# Patient Record
Sex: Male | Born: 1960
Health system: Southern US, Community
[De-identification: ages and names within clinical notes are randomized; demographics above are authoritative.]

## PROBLEM LIST (undated history)

## (undated) DIAGNOSIS — I471 Supraventricular tachycardia, unspecified: Secondary | ICD-10-CM

## (undated) DIAGNOSIS — N189 Chronic kidney disease, unspecified: Secondary | ICD-10-CM

## (undated) DIAGNOSIS — R011 Cardiac murmur, unspecified: Secondary | ICD-10-CM

## (undated) DIAGNOSIS — F419 Anxiety disorder, unspecified: Secondary | ICD-10-CM

## (undated) DIAGNOSIS — I341 Nonrheumatic mitral (valve) prolapse: Secondary | ICD-10-CM

## (undated) DIAGNOSIS — K5792 Diverticulitis of intestine, part unspecified, without perforation or abscess without bleeding: Secondary | ICD-10-CM

## (undated) DIAGNOSIS — G43909 Migraine, unspecified, not intractable, without status migrainosus: Secondary | ICD-10-CM

## (undated) DIAGNOSIS — K3184 Gastroparesis: Secondary | ICD-10-CM

## (undated) DIAGNOSIS — F329 Major depressive disorder, single episode, unspecified: Secondary | ICD-10-CM

## (undated) DIAGNOSIS — T7840XA Allergy, unspecified, initial encounter: Secondary | ICD-10-CM

## (undated) DIAGNOSIS — F32A Depression, unspecified: Secondary | ICD-10-CM

## (undated) HISTORY — DX: Cardiac murmur, unspecified: R01.1

## (undated) HISTORY — DX: Supraventricular tachycardia, unspecified: I47.10

## (undated) HISTORY — DX: Nonrheumatic mitral (valve) prolapse: I34.1

## (undated) HISTORY — DX: Anxiety disorder, unspecified: F41.9

## (undated) HISTORY — DX: Major depressive disorder, single episode, unspecified: F32.9

## (undated) HISTORY — DX: Gastroparesis: K31.84

## (undated) HISTORY — DX: Supraventricular tachycardia: I47.1

## (undated) HISTORY — DX: Migraine, unspecified, not intractable, without status migrainosus: G43.909

## (undated) HISTORY — DX: Depression, unspecified: F32.A

## (undated) HISTORY — DX: Allergy, unspecified, initial encounter: T78.40XA

---

## 1998-01-26 ENCOUNTER — Emergency Department (HOSPITAL_COMMUNITY): Admission: EM | Admit: 1998-01-26 | Discharge: 1998-01-26 | Payer: Self-pay | Admitting: Emergency Medicine

## 1998-01-26 ENCOUNTER — Encounter: Payer: Self-pay | Admitting: Emergency Medicine

## 1999-04-05 ENCOUNTER — Encounter: Payer: Self-pay | Admitting: Gastroenterology

## 1999-04-05 ENCOUNTER — Encounter: Admission: RE | Admit: 1999-04-05 | Discharge: 1999-04-05 | Payer: Self-pay | Admitting: Gastroenterology

## 1999-05-10 ENCOUNTER — Emergency Department (HOSPITAL_COMMUNITY): Admission: EM | Admit: 1999-05-10 | Discharge: 1999-05-10 | Payer: Self-pay | Admitting: Emergency Medicine

## 1999-05-10 ENCOUNTER — Encounter: Payer: Self-pay | Admitting: Emergency Medicine

## 2000-09-05 ENCOUNTER — Encounter: Admission: RE | Admit: 2000-09-05 | Discharge: 2000-09-05 | Payer: Self-pay | Admitting: Family Medicine

## 2000-09-23 ENCOUNTER — Encounter: Admission: RE | Admit: 2000-09-23 | Discharge: 2000-09-23 | Payer: Self-pay | Admitting: Family Medicine

## 2001-06-26 ENCOUNTER — Encounter: Admission: RE | Admit: 2001-06-26 | Discharge: 2001-06-26 | Payer: Self-pay | Admitting: Family Medicine

## 2002-06-11 ENCOUNTER — Encounter: Admission: RE | Admit: 2002-06-11 | Discharge: 2002-06-11 | Payer: Self-pay | Admitting: Emergency Medicine

## 2002-06-11 ENCOUNTER — Encounter: Payer: Self-pay | Admitting: Emergency Medicine

## 2002-09-25 ENCOUNTER — Encounter (INDEPENDENT_AMBULATORY_CARE_PROVIDER_SITE_OTHER): Payer: Self-pay | Admitting: Specialist

## 2002-09-25 ENCOUNTER — Ambulatory Visit (HOSPITAL_COMMUNITY): Admission: RE | Admit: 2002-09-25 | Discharge: 2002-09-25 | Payer: Self-pay | Admitting: Gastroenterology

## 2002-09-28 ENCOUNTER — Encounter: Admission: RE | Admit: 2002-09-28 | Discharge: 2002-09-28 | Payer: Self-pay | Admitting: Gastroenterology

## 2002-09-28 ENCOUNTER — Encounter: Payer: Self-pay | Admitting: Gastroenterology

## 2002-10-09 ENCOUNTER — Ambulatory Visit (HOSPITAL_COMMUNITY): Admission: RE | Admit: 2002-10-09 | Discharge: 2002-10-09 | Payer: Self-pay | Admitting: Surgery

## 2002-11-28 ENCOUNTER — Emergency Department (HOSPITAL_COMMUNITY): Admission: EM | Admit: 2002-11-28 | Discharge: 2002-11-28 | Payer: Self-pay | Admitting: Emergency Medicine

## 2002-11-28 ENCOUNTER — Encounter: Payer: Self-pay | Admitting: Emergency Medicine

## 2002-12-08 ENCOUNTER — Encounter: Admission: RE | Admit: 2002-12-08 | Discharge: 2002-12-08 | Payer: Self-pay | Admitting: Emergency Medicine

## 2002-12-08 ENCOUNTER — Encounter: Payer: Self-pay | Admitting: Emergency Medicine

## 2003-06-22 ENCOUNTER — Emergency Department (HOSPITAL_COMMUNITY): Admission: EM | Admit: 2003-06-22 | Discharge: 2003-06-22 | Payer: Self-pay | Admitting: Family Medicine

## 2005-01-19 ENCOUNTER — Encounter: Admission: RE | Admit: 2005-01-19 | Discharge: 2005-01-19 | Payer: Self-pay | Admitting: Emergency Medicine

## 2005-10-16 ENCOUNTER — Ambulatory Visit: Payer: Self-pay | Admitting: Gastroenterology

## 2005-10-17 ENCOUNTER — Ambulatory Visit: Payer: Self-pay | Admitting: Gastroenterology

## 2005-10-17 ENCOUNTER — Encounter (INDEPENDENT_AMBULATORY_CARE_PROVIDER_SITE_OTHER): Payer: Self-pay | Admitting: *Deleted

## 2005-10-22 ENCOUNTER — Ambulatory Visit (HOSPITAL_COMMUNITY): Admission: RE | Admit: 2005-10-22 | Discharge: 2005-10-22 | Payer: Self-pay | Admitting: Gastroenterology

## 2005-11-02 ENCOUNTER — Encounter: Admission: RE | Admit: 2005-11-02 | Discharge: 2005-11-02 | Payer: Self-pay | Admitting: Gastroenterology

## 2005-12-13 LAB — HM COLONOSCOPY

## 2008-01-15 ENCOUNTER — Encounter: Admission: RE | Admit: 2008-01-15 | Discharge: 2008-01-15 | Payer: Self-pay | Admitting: Emergency Medicine

## 2008-01-27 ENCOUNTER — Encounter: Admission: RE | Admit: 2008-01-27 | Discharge: 2008-04-26 | Payer: Self-pay | Admitting: Emergency Medicine

## 2008-03-10 ENCOUNTER — Encounter: Admission: RE | Admit: 2008-03-10 | Discharge: 2008-03-10 | Payer: Self-pay | Admitting: Emergency Medicine

## 2008-05-05 ENCOUNTER — Encounter: Admission: RE | Admit: 2008-05-05 | Discharge: 2008-05-21 | Payer: Self-pay | Admitting: Emergency Medicine

## 2008-11-30 ENCOUNTER — Encounter: Payer: Self-pay | Admitting: Cardiovascular Disease

## 2009-01-25 ENCOUNTER — Encounter: Admission: RE | Admit: 2009-01-25 | Discharge: 2009-01-25 | Payer: Self-pay | Admitting: Emergency Medicine

## 2009-03-29 ENCOUNTER — Encounter: Admission: RE | Admit: 2009-03-29 | Discharge: 2009-03-29 | Payer: Self-pay | Admitting: Emergency Medicine

## 2009-03-29 ENCOUNTER — Encounter: Payer: Self-pay | Admitting: Cardiovascular Disease

## 2009-04-17 ENCOUNTER — Emergency Department (HOSPITAL_COMMUNITY): Admission: EM | Admit: 2009-04-17 | Discharge: 2009-04-17 | Payer: Self-pay | Admitting: Family Medicine

## 2009-06-01 ENCOUNTER — Encounter: Admission: RE | Admit: 2009-06-01 | Discharge: 2009-06-01 | Payer: Self-pay | Admitting: Emergency Medicine

## 2009-06-13 ENCOUNTER — Emergency Department (HOSPITAL_BASED_OUTPATIENT_CLINIC_OR_DEPARTMENT_OTHER): Admission: EM | Admit: 2009-06-13 | Discharge: 2009-06-13 | Payer: Self-pay | Admitting: Emergency Medicine

## 2009-06-13 ENCOUNTER — Ambulatory Visit: Payer: Self-pay | Admitting: Diagnostic Radiology

## 2009-06-13 ENCOUNTER — Encounter: Payer: Self-pay | Admitting: Cardiovascular Disease

## 2009-06-14 ENCOUNTER — Encounter: Payer: Self-pay | Admitting: Cardiovascular Disease

## 2009-06-17 ENCOUNTER — Encounter: Payer: Self-pay | Admitting: Cardiovascular Disease

## 2009-06-20 ENCOUNTER — Encounter: Payer: Self-pay | Admitting: Cardiovascular Disease

## 2009-06-28 ENCOUNTER — Ambulatory Visit: Payer: Self-pay | Admitting: Cardiovascular Disease

## 2009-06-28 DIAGNOSIS — R002 Palpitations: Secondary | ICD-10-CM

## 2009-06-28 DIAGNOSIS — R079 Chest pain, unspecified: Secondary | ICD-10-CM

## 2009-07-12 ENCOUNTER — Telehealth (INDEPENDENT_AMBULATORY_CARE_PROVIDER_SITE_OTHER): Payer: Self-pay | Admitting: *Deleted

## 2009-07-13 ENCOUNTER — Encounter: Payer: Self-pay | Admitting: Cardiology

## 2009-07-13 ENCOUNTER — Ambulatory Visit: Payer: Self-pay | Admitting: Cardiology

## 2009-07-13 ENCOUNTER — Ambulatory Visit (HOSPITAL_COMMUNITY): Admission: RE | Admit: 2009-07-13 | Discharge: 2009-07-13 | Payer: Self-pay | Admitting: Cardiovascular Disease

## 2009-07-13 ENCOUNTER — Ambulatory Visit: Payer: Self-pay

## 2009-07-13 ENCOUNTER — Encounter (HOSPITAL_COMMUNITY): Admission: RE | Admit: 2009-07-13 | Discharge: 2009-08-31 | Payer: Self-pay | Admitting: Cardiovascular Disease

## 2009-07-15 DIAGNOSIS — G43909 Migraine, unspecified, not intractable, without status migrainosus: Secondary | ICD-10-CM | POA: Insufficient documentation

## 2009-07-15 DIAGNOSIS — I059 Rheumatic mitral valve disease, unspecified: Secondary | ICD-10-CM | POA: Insufficient documentation

## 2009-07-15 DIAGNOSIS — K589 Irritable bowel syndrome without diarrhea: Secondary | ICD-10-CM | POA: Insufficient documentation

## 2009-07-18 ENCOUNTER — Ambulatory Visit: Payer: Self-pay | Admitting: Cardiovascular Disease

## 2009-07-30 ENCOUNTER — Ambulatory Visit: Payer: Self-pay | Admitting: Radiology

## 2009-07-30 ENCOUNTER — Emergency Department (HOSPITAL_BASED_OUTPATIENT_CLINIC_OR_DEPARTMENT_OTHER): Admission: EM | Admit: 2009-07-30 | Discharge: 2009-07-30 | Payer: Self-pay | Admitting: Emergency Medicine

## 2009-08-12 ENCOUNTER — Encounter: Admission: RE | Admit: 2009-08-12 | Discharge: 2009-08-12 | Payer: Self-pay | Admitting: Emergency Medicine

## 2009-09-05 ENCOUNTER — Emergency Department (HOSPITAL_COMMUNITY): Admission: EM | Admit: 2009-09-05 | Discharge: 2009-09-05 | Payer: Self-pay | Admitting: Family Medicine

## 2009-12-15 ENCOUNTER — Telehealth: Payer: Self-pay | Admitting: Cardiovascular Disease

## 2009-12-20 ENCOUNTER — Telehealth: Payer: Self-pay | Admitting: Cardiovascular Disease

## 2010-01-04 ENCOUNTER — Ambulatory Visit: Payer: Self-pay | Admitting: Cardiovascular Disease

## 2010-01-05 ENCOUNTER — Ambulatory Visit: Payer: Self-pay | Admitting: Radiology

## 2010-01-05 ENCOUNTER — Emergency Department (HOSPITAL_BASED_OUTPATIENT_CLINIC_OR_DEPARTMENT_OTHER): Admission: EM | Admit: 2010-01-05 | Discharge: 2010-01-05 | Payer: Self-pay | Admitting: Emergency Medicine

## 2010-01-08 ENCOUNTER — Emergency Department (HOSPITAL_BASED_OUTPATIENT_CLINIC_OR_DEPARTMENT_OTHER): Admission: EM | Admit: 2010-01-08 | Discharge: 2010-01-08 | Payer: Self-pay | Admitting: Emergency Medicine

## 2010-01-08 ENCOUNTER — Ambulatory Visit: Payer: Self-pay | Admitting: Interventional Radiology

## 2010-01-09 ENCOUNTER — Telehealth: Payer: Self-pay | Admitting: Cardiovascular Disease

## 2010-01-10 ENCOUNTER — Ambulatory Visit: Payer: Self-pay | Admitting: Cardiovascular Disease

## 2010-01-10 ENCOUNTER — Telehealth: Payer: Self-pay | Admitting: Cardiovascular Disease

## 2010-01-19 ENCOUNTER — Ambulatory Visit (HOSPITAL_COMMUNITY): Admission: RE | Admit: 2010-01-19 | Discharge: 2010-01-19 | Payer: Self-pay | Admitting: Gastroenterology

## 2010-05-30 NOTE — Letter (Signed)
Summary: [facility] Annual Physical Note  [facility] Annual Physical Note   Imported By: Roderic Ovens 07/12/2009 14:51:37  _____________________________________________________________________  External Attachment:    Type:   Image     Comment:   External Document

## 2010-05-30 NOTE — Letter (Signed)
Summary: [facility] Practice Office Note  [facility] Office Note   Imported By: Roderic Ovens 07/12/2009 14:53:44  _____________________________________________________________________  External Attachment:    Type:   Image     Comment:   External Document

## 2010-05-30 NOTE — Progress Notes (Signed)
Summary: Pt having palp  Phone Note Call from Patient Call back at [facility] Phone [phone]   Caller: Patient Summary of Call: pt having palp  Initial call taken by: Judie Grieve,  December 15, 2009 9:23 AM  Follow-up for Phone Call        Called patient back. He is complaining of palpitations on and off times one week. He restarted the Toprol XL 25 mg every day but states that the medication is not working for him. He denies any  syncope or presyncope with the palpitations. His last event monitor  was around last March. Advised him to ge a BP/HR monitor and record vitals 2 times per day so we can see if medication can be increased. He will do this and call us back with the information. I advised him that  Dr.McAlhany might want to order another event monitor and if he does he will have to write a letter to [facility] to approve another monitor since it has been less tha 6 months since his last monitor. Will send this message to Dr.McAlhany for his recommendation. Follow-up by: J REISS RN     Appended Document: Pt having palp Can we call and check in on him today? If he is still having palpitations, he can increase his Toprol to 50 mg per day. He had no evidence of any arrythmias on his Holter this spring. Normal echo and stress test. Thanks, cdm  Appended Document: Pt having palp LMTCB.-nm

## 2010-05-30 NOTE — Progress Notes (Signed)
Summary: palp nad vomiting  Phone Note Call from Patient Call back at [phone]   Caller: Spouse/Allison Summary of Call: Pt wife calling regarding the pt having heart palp and the pt has been vomiting Initial call taken by: Judie Grieve,  January 09, 2010 8:40 AM  Follow-up for Phone Call        Spoke w/ pt's wife & she is concerned b/c pt has been vomiting and not able to take his cardizem so he is having more palpitations. Dr. Lorenz Coaster advised pt  to go to ED on Sunday & they hydrated him and noted a few PVCs on his EKG. Pt. released Sunday night from ED. Pt. has appt with Lorenz Coaster this am for his naseau and may need to be admitted for hydration. Told his wife that he is prob. having more palpitations b/c of his vomiting and not being able to take his cardizem so the plan would initially be to get his vomiting under control so he can take his meds. Told wife to call back if he has further problems or if primary does not control his naseau. Whitney Maeola Sarah RN  January 09, 2010 9:31 AM

## 2010-05-30 NOTE — Letter (Signed)
Summary: [facility] Practice Office Note  [facility] Office Note   Imported By: Roderic Ovens 07/12/2009 14:54:59  _____________________________________________________________________  External Attachment:    Type:   Image     Comment:   External Document

## 2010-05-30 NOTE — Assessment & Plan Note (Signed)
Summary: palpitations   Visit Type:  Follow-up Primary Provider:  Natasha Bence  CC:  palpitations.  History of Present Illness: [name] with history of migraine headaches and irritable bowel syndrome with previous diagnosis of mitral valve prolapse in 1989 who is here today for follow up. He was seen as a new patient three weeks ago for further evaluation of chest pain and "pounding" heart beats. He first noticed the heavy heart beats about 3 months prior to his first visit here in February 2011. These episodes were generally short lived without associated symptoms. On February 14th, he experienced a slow, pounding sensation in his chest with SOB and presented to [facility] on [address].  He had an EKG, CXR and blood work, all of which is reported as normal in the medical record. The episode had passed and all symptoms had resolved by the time he was evaluated in the ER. He has had daily symptoms since then. He actually had a Holter monitor on the day of his severe palpitations/pounding and this showed sinus rhythm. He has been under much stress lately. He has been seen several times by Dr. Lorenz Coaster who placed the Holter monitor and started him on Toprol Xl which has since been titrated to 50 mg per day. He was wearing a Holter monitor during his first visit in our office. That is reviewed and shows no atrial fibrillation, SVT or VT.  I ordered a stress test and echo. His stress test showed no ischemia. His echo was normal.   He tells me today that he is having daily palpitations. These feel like skipped beats folllowed by a pause. His EKG today shows a PVC.  He has been on Toprol in the past but this made him fatigued. He does not wish to restart his beta blocker.  He has plans for an EKG next week in workup on ongoing nausea and vomiting with weight loss.   Current Medications (verified): 1)  Klonopin 2 Mg Tabs (Clonazepam) .... Once Daily. As Needed 2)  Toprol Xl 25 Mg Xr24h-Tab  (Metoprolol Succinate) .... On Hold 3)  Compazine 10mg  .... As Needed For [place] 4)  Oxycodone-Acetaminophen 5-325 Mg Tabs (Oxycodone-Acetaminophen) .... Prn 5)  Oxigen .... As Needed  Allergies: 1)  ! Toradol 2)  ! Phenergan 3)  ! Nubain 4)  ! * Latex 5)  ! Erythromycin  Past History:  Past Medical History: Current Problems:  MIGRAINE HEADACHE (ICD-346.90) IRRITABLE BOWEL SYNDROME (ICD-564.1) Palpitations secondary to PVCs  Social History: Reviewed history from 07/15/2009 and no changes required. 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  The patient denies malaise, fever, weight gain/loss, vision loss, decreased hearing, hoarseness, chest pain, shortness of breath, prolonged cough, wheezing, sleep apnea, coughing up blood, abdominal pain, blood in stool, nausea, vomiting, diarrhea, heartburn, incontinence, blood in urine, muscle weakness, joint pain, leg swelling, rash, skin lesions, headache, fainting, dizziness, depression, anxiety, enlarged lymph nodes, easy bruising or bleeding, and environmental allergies.    Vital Signs:  Patient profile:   50 year old male Height:      71 inches Weight:      123.25 pounds BMI:     17.25 Pulse rate:   79 / minute Pulse rhythm:   irregular Resp:     18 per minute BP sitting:  106 / 72  (left arm) Cuff size:   regular  Vitals Entered By: Vikki Ports (January 04, 2010 3:02 PM)  Physical Exam  General:  General: Well developed, well nourished, NAD Musculoskeletal: Muscle strength 5/5 all ext Psychiatric: Mood and affect normal Neck: No JVD, no carotid bruits, no thyromegaly, no lymphadenopathy. Lungs:Clear bilaterally, no wheezes, rhonci, crackles CV: RRR no murmurs, gallops rubs Abdomen: soft, NT, ND, BS present Extremities: No edema, pulses 2+.    EKG  Procedure date:   01/04/2010  Findings:      Sinus rhythm, rate 81 bpm. PVC. RAE  Impression & Recommendations:  Problem # 1:  PALPITATIONS (ICD-785.1) His palpitations are most likely secondary to PVCs. With normal LV function, these are most likely benign. He feels that these are limiting his lifestyle. Will start Cardizem CD 120 mg once daily. He will check his BP this week at the local pharmacy. If his BP is low, he will call us and hold Cardizem.  He will not restart the Toprol as it made him feel weak.   His updated medication list for this problem includes:    Toprol Xl 25 Mg Xr24h-tab (Metoprolol succinate) ..... On hold    Cardizem Cd 120 Mg Xr24h-cap (Diltiazem hcl coated beads) .[name]... Take one capsule by mouth daily.  His updated medication list for this problem includes:    Toprol Xl 25 Mg Xr24h-tab (Metoprolol succinate) ..... On hold  Orders: EKG w/ Interpretation (93000)  Patient Instructions: 1)  Your physician recommends that you schedule a follow-up appointment in: 3 MONTHS 2)  Your physician has recommended you make the following change in your medication:  3)  START CARDIZEM CD 120MG  by mouth DAILY. Prescriptions: CARDIZEM CD 120 MG XR24H-CAP (DILTIAZEM HCL COATED BEADS) TAKE ONE CAPSULE by mouth DAILY.  #30 x 4   Entered by:   Whitney Maeola Sarah RN   Authorized by:   Verne Carrow, MD   Signed by:   Ellender Hose RN on 01/04/2010   Method used:   Electronically to        [facility] [phone]* (retail)       [address]       [place], [place]  [address]       Ph: [phone]       Fax: [phone]   RxID:   [id number]

## 2010-05-30 NOTE — Progress Notes (Signed)
Summary: Nuclear Pre-Procedure  Phone Note Outgoing Call   Call placed by: Milana Na, EMT-P,  July 12, 2009 1:26 PM Summary of Call: Reviewed information on Myoview Information Sheet (see scanned document for further details).  Spoke with patient.     Nuclear Med Background Indications for Stress Test: Evaluation for Ischemia   History: MVP   Symptoms: Chest Pain, Chest Pressure, Dizziness, Light-Headedness, Palpitations, SOB    Nuclear Pre-Procedure Height (in): 71  Nuclear Med Study Referring MD:  C.McAlhany

## 2010-05-30 NOTE — Letter (Signed)
Summary: [facility] Practice Office Note  [facility] Office Note   Imported By: Roderic Ovens 07/12/2009 14:54:15  _____________________________________________________________________  External Attachment:    Type:   Image     Comment:   External Document

## 2010-05-30 NOTE — Progress Notes (Signed)
Summary: Nuclear Pre-Procedure  Phone Note Outgoing Call   Call placed by: Milana Na, EMT-P,  July 12, 2009 1:18 PM Summary of Call: Reviewed information on Myoview Information Sheet (see scanned document for further details).  Spoke with patient.

## 2010-05-30 NOTE — Progress Notes (Signed)
Summary: still having palpitations  Phone Note Call from Patient   Reason for Call: Talk to Nurse Summary of Call: pt still having palpitations-made appt 9-7-but also returining call to [facility] from yesterday-pls call [phone] Initial call taken by: Glynda Jaeger,  December 20, 2009 9:10 AM  Follow-up for Phone Call        Pat, Can we have him increase his Toprol XL to 50 mg per day. I can see him as planned on 01/04/10. thanks, chris Follow-up by: Verne Carrow, MD,  December 20, 2009 9:13 AM  Additional Follow-up for Phone Call Additional follow up Details #1::        Spoke with pt who reports he is taking Toprol 25 mg daily. He has problems with migraines and is being weaned from his medications for migraines due to nausea.  He is going to see primary MD (Dr. Lorenz Coaster) tomorrow. I gave pt instructions per Dr.Aneira Cavitt to increase Toprol to 50 mg by mouth daily.  I told pt I would forward this phone note to Dr. Lorenz Coaster. Pt to see Dr. Clifton James on January 04, 2010. Additional Follow-up by: Dossie Arbour, RN, BSN,  December 20, 2009 9:57 AM

## 2010-05-30 NOTE — Progress Notes (Signed)
Summary: pt's wife wants a call  Phone Note Call from Patient   Caller: Spouse [phone] [facility] Reason for Call: Talk to Nurse Summary of Call: pt on nausea med-now having shakes-pt took benadryl and it stopped-however wife still concerned with pt's condition-pls call [phone] [facility] Initial call taken by: Glynda Jaeger,  January 10, 2010 9:05 AM  Follow-up for Phone Call        Pt still experiencing some palpitations. He is taking Reglan for his naseau prescribed by his primary MD and is able to keep his cardizem down. Advised pt to allow the cardizem to circulate in his system and call me back around noon if his palpitations are still bothering them and we may manage by adding a beta blocker or see him in the office. Whitney Maeola Sarah RN  January 10, 2010 9:23 AM   Additional Follow-up for Phone Call Additional follow up Details #1::        Pt. is coming to see Dr. Clifton James today 01/10/10 @ 3pm for his palpitations. Whitney Maeola Sarah RN  January 10, 2010 12:44 PM

## 2010-05-30 NOTE — Assessment & Plan Note (Signed)
Summary: rov/wpa   Visit Type:  DOD ADD-ON Primary Provider:  Natasha Bence  CC:  palpitations pt states these are sometimes painful.....  History of Present Illness: [name] with history of migraine headaches and irritable bowel syndrome with previous diagnosis of mitral valve prolapse in 1989 who is here today for follow up. He was here last week for follow up and had a PVC documented on his EKG. He did not tolerate beta blockers in the past. I started him on Cardizem CD 120 mg per day. He has had constant nausea and vomiting on a daily basis and has only been able to keep down two doses of Cardizem.  A GI workup has been started with EGD last week shwing some gastritis per pt. He has been started on a PPI with plans for a gastric emptying study. As noted before, his echo and stress test were normal. He describes feelings of fullness in his chest after the premature beats. No exertional chest pain or SOB. No near syncope or syncope.   He first noticed the heavy heart beats about 3 months prior to his first visit here in February 2011. These episodes were generally short lived without associated symptoms. On February 14th, he experienced a slow, pounding sensation in his chest with SOB and presented to [facility] on [address].  He had an EKG, CXR and blood work, all of which is reported as normal in the medical record. The episode had passed and all symptoms had resolved by the time he was evaluated in the ER. He has had daily symptoms since then. He actually had a Holter monitor on the day of his severe palpitations/pounding and this showed sinus rhythm.  He was wearing a Holter monitor during his first visit in our office. That was reviewed and showed no atrial fibrillation, SVT or VT.   [name]   Current Medications (verified): 1)  Klonopin 2 Mg Tabs (Clonazepam) .... Once Daily. As Needed 2)  Toprol Xl 25 Mg Xr24h-Tab (Metoprolol Succinate) .... On Hold 3)  Oxycodone-Acetaminophen  5-325 Mg Tabs (Oxycodone-Acetaminophen) .... Prn 4)  Oxigen .... As Needed 5)  Cardizem Cd 120 Mg Xr24h-Cap (Diltiazem Hcl Coated Beads) .... Take One Capsule By Mouth Daily. 6)  Reglan 10 Mg Tabs (Metoclopramide Hcl) .[name].. 1 Tab Once Daily 7)  Zofran 8 Mg Tabs (Ondansetron Hcl) .... As Needed 8)  Prilosec 40 Mg Cpdr (Omeprazole) .[name].. 1 Tab Once Daily  Allergies: 1)  ! Toradol 2)  ! Phenergan 3)  ! Nubain 4)  ! * Latex 5)  ! Erythromycin 6)  ! Compazine  Past History:  Past Medical History: Current Problems:  MIGRAINE HEADACHE (ICD-346.90) IRRITABLE BOWEL SYNDROME (ICD-564.1) Palpitations secondary to PVCs Unknown GI illness with constant nausea/vomiting and weight loss  Social History: Reviewed history from 07/15/2009 and no changes required. 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  The patient denies fatigue, malaise, fever, weight gain/loss, vision loss, decreased hearing, hoarseness, chest pain, shortness of breath, prolonged cough, wheezing, sleep apnea, coughing up blood, abdominal pain, blood in stool, diarrhea, heartburn, incontinence, blood in urine, muscle weakness, joint pain, leg swelling, rash, skin lesions, headache, fainting, dizziness, depression, anxiety, enlarged lymph nodes, easy bruising or bleeding, and environmental allergies.    Vital Signs:  Patient profile:   50  year old male Height:      71 inches Weight:      122.12 pounds BMI:     17.09 Pulse rate:   70 / minute Pulse rhythm:   irregular BP sitting:   96 / 62  (left arm) Cuff size:   regular  Vitals Entered By: Danielle Rankin, CMA (January 10, 2010 3:03 PM)  Physical Exam  General:  General: Well developed, well nourished, NAD Musculoskeletal: Muscle strength 5/5 all ext Psychiatric: Mood and affect normal Neck: No JVD, no carotid bruits,  no thyromegaly, no lymphadenopathy. Lungs:Clear bilaterally, no wheezes, rhonci, crackles CV: RRR no murmurs, gallops rubs Abdomen: soft, NT, ND, BS present Extremities: No edema, pulses 2+.    EKG  Procedure date:  01/10/2010  Findings:      Sinus rhythm, rate 70 bpm. PVCs.  Impression & Recommendations:  Problem # 1:  PALPITATIONS (ICD-785.1) He has PVCs on his EKG and on examination. He has normal LV function by echo. His stress test shows no ischemia. He currently is under much stress with his GI illness and constant nausea/vomiting and unexplained weight loss. His PVCs are symptomatic. He has been started on Cardizem CD 120 mg by mouth Qdaily. He has not been able to keep this down because of his nausea. I have reassured him that the PVCs are benign and while he notices them, they are  not dangerous. Once he is able to hold down the Cardizem, I think the PVCs will be better controlled. No further cardiac workup at this time.   His updated medication list for this problem includes:    Toprol Xl 25 Mg Xr24h-tab (Metoprolol succinate) ..... On hold    Cardizem Cd 120 Mg Xr24h-cap (Diltiazem hcl coated beads) .[name]... Take one capsule by mouth daily.  Orders: EKG w/ Interpretation (93000)  Patient Instructions: 1)  Your physician recommends that you schedule a follow-up appointment in: 6 months 2)  Your physician recommends that you continue on your current medications as directed. Please refer to the Current Medication list given to you today.

## 2010-05-30 NOTE — Letter (Signed)
Summary: Work Writer, Main Office  [address]   [place], [place] [address]   Phone: [phone]  Fax: [phone]     June 28, 2009    William [name]   The above named patient had a medical visit today at:  2:30     pm.  Please take this into consideration when reviewing the time away from work/school.      Sincerely yours,  Architectural technologist

## 2010-05-30 NOTE — Assessment & Plan Note (Signed)
Summary: Cardiology Nuclear Study  Nuclear Med Background Indications for Stress Test: Evaluation for Ischemia   History: Echo, MVP  History Comments:  ~10 yrs ago GXT:OK per patient; 07/13/09 Echo:EF=55-60%  Symptoms: Chest Pain, Chest Pressure, Dizziness, DOE, Fatigue, Fatigue with Exertion, Light-Headedness, Palpitations, Rapid HR, SOB  Symptoms Comments: Stress. Last episode of CP:07/08/09.   Nuclear Pre-Procedure Caffeine/Decaff Intake: None NPO After: 8:00 AM Lungs: Clear IV 0.9% NS with Angio Cath: 22g     IV Site: (R) Hand IV Started by: Irean Hong RN Chest Size (in) 38     Height (in): 71 Weight (lb): 122 BMI: 17.08 Tech Comments: Held Toprol x 24 hours  Nuclear Med Study 1 or 2 day study:  1 day     Stress Test Type:  Stress Reading MD:  Olga Millers, MD     Referring MD:  Verne Carrow, MD Resting Radionuclide:  Technetium 66m Tetrofosmin     Resting Radionuclide Dose:  11.0 mCi  Stress Radionuclide:  Technetium 23m Tetrofosmin     Stress Radionuclide Dose:  33.0 mCi   Stress Protocol Exercise Time (min):  7:15 min     Max HR:  153 bpm     Predicted Max HR:  171 bpm  Max Systolic BP: 151 mm Hg     Percent Max HR:  89.47 %     METS: 8.9 Rate Pressure Product:  [address]    Stress Test Technologist:  [employer] CMA-N     Nuclear Technologist:  Harlow Asa CNMT  Rest Procedure  Myocardial perfusion imaging was performed at rest 45 minutes following the intravenous administration of Myoview Technetium 46m Tetrofosmin.  Stress Procedure  The patient exercised for 7:15.  The patient stopped due to fatigue and he denied any significant chest pain.  He did c/o of a very brief pain at the beginning of exercise.  There were no significant ST-T wave changes.  Myoview was injected at peak exercise and myocardial perfusion imaging was performed after a brief delay.  QPS Raw Data Images:  Acuisition technically good; normal left ventricular size. Stress  Images:  There is normal uptake in all areas. Rest Images:  Normal homogeneous uptake in all areas of the myocardium. Subtraction (SDS):  No evidence of ischemia. Transient Ischemic Dilatation:  1.02  (Normal <1.22)  Lung/Heart Ratio:  .25  (Normal <0.45)  Quantitative Gated Spect Images QGS EDV:  63 ml QGS ESV:  26 ml QGS EF:  59 % QGS cine images:  Normal wall motion.   Overall Impression  Exercise Capacity: Fair exercise capacity. BP Response: Normal blood pressure response. Clinical Symptoms: Brief chest pressure. ECG Impression: No significant ST segment change suggestive of ischemia. Overall Impression: There is no sign of scar or ischemia.  Appended Document: Cardiology Nuclear Study Reviewed and ok. cdm  Appended Document: Cardiology Nuclear Study Dr. Clifton James reviewed with pt at office visit 07/18/09

## 2010-05-30 NOTE — Assessment & Plan Note (Signed)
Summary: per check out/sf   Visit Type:  1 mo f/u Primary Provider:  Natasha Bence  CC:  palpitations.  History of Present Illness: [name] with history of migraine headaches and irritable bowel syndrome with previous diagnosis of mitral valve prolapse in 1989 who is here today for follow up. He was seen as a new patient three weeks ago for further evaluation of chest pain and "pounding" heart beats. He first noticed the heavy heart beats about 3 months prior to his first visit here. These episodes were generally short lived without associated symptoms. On February 14th, he experienced a slow, pounding sensation in his chest with SOB and presented to [facility] on [address].  He had an EKG, CXR and blood work, all of which is reported as normal in the medical record. The episode had passed and all symptoms had resolved by the time he was evaluated in the ER. He has had daily symptoms since then. He actually had a Holter monitor on the day of his severe palpitations/pounding and this showed sinus rhythm. He has been under much stress lately. He has been seen several times by Dr. Lorenz Coaster who placed the Holter monitor and started him on Toprol Xl which has since been titrated to 50 mg per day. He was wearing a Holter monitor during his first visit in our office. That is reviewed and shows no atrial fibrillation, SVT or VT.    His stress test showed no ischemia. His echo was normal. He continues to have daily fatigue. No chest pain. Occasional "pounding" in his chest.   Current Medications (verified): 1)  Klonopin 2 Mg Tabs (Clonazepam) .... Once Daily 2)  Remeron 45 Mg Tabs (Mirtazapine) .... Eveery Night 3)  Toprol Xl 25 Mg Xr24h-Tab (Metoprolol Succinate) .... Once Daily As Needed 4)  Amitiza 24 Mcg Caps (Lubiprostone) .... Prn 5)  Generlac 10 Gm/75ml Soln (Lactulose Encephalopathy) .... Prn 6)  Zofran 8 Mg Tabs (Ondansetron Hcl) .... As Needed For [place] 7)  Hyomax-Sl 0.125 Mg  Subl (Hyoscyamine Sulfate) .... Prn 8)  Oxycodone-Acetaminophen 5-325 Mg Tabs (Oxycodone-Acetaminophen) .... Prn  Allergies: 1)  ! Toradol 2)  ! Phenergan 3)  ! Nubain 4)  ! * Latex 5)  ! Erythromycin  Past History:  Past Medical History: Current Problems:  MIGRAINE HEADACHE (ICD-346.90) IRRITABLE BOWEL SYNDROME (ICD-564.1)  Social History: Reviewed history from 07/15/2009 and no changes required. 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  The patient denies malaise, fever, weight gain/loss, vision loss, decreased hearing, hoarseness, chest pain, shortness of breath, prolonged cough, wheezing, sleep apnea, coughing up blood, abdominal pain, blood in stool, nausea, vomiting, diarrhea, heartburn, incontinence, blood in urine, muscle weakness, joint pain, leg swelling, rash, skin lesions, headache, fainting, dizziness, depression, anxiety, enlarged lymph nodes, easy bruising or bleeding, and environmental allergies.    Vital Signs:  Patient profile:   50 year old male Height:      71 inches Weight:      125 pounds BMI:     17.50 Pulse rate:   88 / minute Pulse rhythm:   regular BP sitting:   94 / 60  (left arm) Cuff size:   regular  Vitals Entered By: Danielle Rankin, CMA (July 18, 2009 10:17 AM)  Physical Exam  General:  General: Well developed, well  nourished, NAD Psychiatric: Mood and affect normal Neck: No JVD, no carotid bruits, no thyromegaly, no lymphadenopathy. Lungs:Clear bilaterally, no wheezes, rhonci, crackles CV: RRR no murmurs, gallops rubs Abdomen: soft, NT, ND, BS present Extremities: No edema, pulses 2+.    Nuclear ETT  Procedure date:  07/13/2009  Findings:      Stress Procedure   The patient exercised for 7:15.  The patient stopped due to fatigue and he denied any significant chest pain.  He did c/o of a  very brief pain at the beginning of exercise.  There were no significant ST-T wave changes.  Myoview was injected at peak exercise and myocardial perfusion imaging was performed after a brief delay.  QPS  Raw Data Images:  Acuisition technically good; normal left ventricular size. Stress Images:  There is normal uptake in all areas. Rest Images:  Normal homogeneous uptake in all areas of the myocardium. Subtraction (SDS):  No evidence of ischemia. Transient Ischemic Dilatation:  1.02  (Normal <1.22)  Lung/Heart Ratio:  .25  (Normal <0.45)  Quantitative Gated Spect Images  QGS EDV:  63 ml QGS ESV:  26 ml QGS EF:  59 % QGS cine images:  Normal wall motion.   Overall Impression   Exercise Capacity: Fair exercise capacity. BP Response: Normal blood pressure response. Clinical Symptoms: Brief chest pressure. ECG Impression: No significant ST segment change suggestive of ischemia. Overall Impression: There is no sign of scar or ischemia.    Echocardiogram  Procedure date:  07/13/2009  Findings:      Left ventricle: The cavity size was normal. Wall thickness was     normal. Systolic function was normal. The estimated ejection     fraction was in the range of 55% to 60%. Wall motion was normal;     there were no regional wall motion abnormalities.    Impression & Recommendations:  Problem # 1:  PALPITATIONS (ICD-785.1) No evidence of arrhythmias on Holter Monitor or 21 day event monitor. He tells me that he was feeling the palpitations while wearing the monitor. No further workup.  His updated medication list for this problem includes:    Toprol Xl 25 Mg Xr24h-tab (Metoprolol succinate) ..... Once daily as needed  Problem # 2:  CHEST PAIN (ICD-786.50) No ischemia on stress test. Normal LV systolic function. No significant valvular disease. No further workup.   His updated medication list for this problem includes:    Toprol Xl 25 Mg Xr24h-tab (Metoprolol succinate) ..... Once  daily as needed  Problem # 3:  MITRAL VALVE PROLAPSE (ICD-424.0) Trivial MR on echo. No evidence of significant prolapse.   His updated medication list for this problem includes:    Toprol Xl 25 Mg Xr24h-tab (Metoprolol succinate) ..... Once daily as needed  Patient Instructions: 1)  Your physician recommends that you schedule a follow-up appointment as needed  2)  Your physician recommends that you continue on your current medications as directed. Please refer to the Current Medication list given to you today.

## 2010-05-30 NOTE — Assessment & Plan Note (Signed)
Summary: np6/palps/cp/jss   Visit Type:  Initial Consult Primary Provider:  Natasha Bence  CC:  palpitations.  History of Present Illness: [name] with history of migraine headaches and irritable bowel syndrome with previous diagnosis of mitral valve prolapse in 1989 who is referred today for further evaluation of chest pain and "pounding" heart beats. He first noticed the heavy heart beats about 3 months ago. These episodes were generally short lived without associated symptoms. On February 14th, he experienced a slow, pounding sensation in his chest with SOB and presented to [facility] on [address].  He had an EKG, CXR and blood work, all of which is reported as normal in the medical record. The episode had passed and all symptoms had resolved by the time he was evaluated in the ER. He has had daily symptoms since then. He actually had a Holter monitor on the day of his severe palpitations/pounding and this showed sinus rhythm. He has been under much stress lately. He has been seen several times by Dr. Lorenz Coaster who placed the Holter monitor and started him on Toprol Xl which has since been titrated to 50 mg per day. He currently is wearing a 21 day event monitor. As noted above, he continues to have daily episodes of mild pounding in his chest. There have been some episodes of squeezing type pain in the center of his chest. This seems to be associated with the pounding sensation. He has not passed out. He does occasionally feel dizzy and lightheaded when he notices the pounding sensation in his chest.   His records from Dr. Ginny Forth office include the Holter report and strips. I have personally reviewed this and confirmed sinus rhythm without PACs, PVCs, atrial fibrillation or SVT.   Problems Prior to Update: 1)  Palpitations  (ICD-785.1) 2)  Chest Pain  (ICD-786.50)  Current Medications (verified): 1)  Klonopin 2 Mg Tabs (Clonazepam) .... Once Daily 2)  Remeron 45 Mg Tabs  (Mirtazapine) .... Eveery Night 3)  Toprol Xl 25 Mg Xr24h-Tab (Metoprolol Succinate) .... Once Daily 4)  Lotemax 0.5 % Susp (Loteprednol Etabonate) .... Irration of The Eye 5)  Restasis 0.05 % Emul (Cyclosporine) .... For Dry Eye 6)  Amitiza 24 Mcg Caps (Lubiprostone) .... Prn 7)  Generlac 10 Gm/8ml Soln (Lactulose Encephalopathy) .... Prn 8)  Meloxicam 15 Mg Tabs (Meloxicam) .... Prn 9)  Zofran 8 Mg Tabs (Ondansetron Hcl) .... Prn 10)  Hyomax-Sl 0.125 Mg Subl (Hyoscyamine Sulfate) .... Prn 11)  Oxycodone-Acetaminophen 5-325 Mg Tabs (Oxycodone-Acetaminophen) .... Prn  Past History:  Past Medical History: Migraine Headaches IBS Palpitations Mitral Valve Prolapse  Past Surgical History: None  Family History: Reviewed history from 06/27/2006 and no changes required. Father died of lung CA Mother alive with HTN One brother is younger and healthy  No CAD  Social History: Reviewed history from 06/28/2009 and no changes required. 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  The patient denies fatigue, malaise, fever, weight gain/loss, vision loss, decreased hearing, hoarseness, prolonged cough, wheezing, sleep apnea, coughing up blood, abdominal pain, blood in stool, nausea, vomiting, diarrhea, heartburn, incontinence, blood in urine, muscle weakness, joint pain, leg swelling, rash, skin lesions, headache, fainting, dizziness, depression, enlarged lymph nodes, easy bruising or bleeding, and environmental allergies.    Vital Signs:  Patient  profile:   50 year old male Height:      71 inches Weight:      123 pounds BMI:     17.22 Pulse rate:   76 / minute BP sitting:   106 / 71  (left arm) Cuff size:   regular  Vitals Entered By: [name] (June 28, 2009 3:12 PM)  Physical Exam  General:   General: Well developed, well nourished, NAD HEENT: OP clear, mucus membranes moist SKIN: warm, dry Neuro: No focal deficits Musculoskeletal: Muscle strength 5/5 all ext Psychiatric: Mood and affect normal Neck: No JVD, no carotid bruits, no thyromegaly, no lymphadenopathy. Lungs:Clear bilaterally, no wheezes, rhonci, crackles CV: RRR no murmurs, gallops rubs Abdomen: soft, NT, ND, BS present Extremities: No edema, pulses 2+.    EKG  Procedure date:  06/28/2009  Findings:      NSR, rate 74 bpm. Normal EKG.  Holter Monitor  Procedure date:  06/13/2009  Findings:      Normal sinus rhythm. No PACs, PVCs. No atrial fibrillation or SVT.   Impression & Recommendations:  Problem # 1:  CHEST PAIN (ICD-786.50) His symptoms are not classic for angina. He has no cardiac risk factors. He is having repeated episodes of chest discomfort. I suspect that this is related to anxiety. I will arrange an exercise stress myoview to exclude CAD. I will also arrange an echo to assess for structural heart disease. Continue Toprol Xl once daily.   His updated medication list for this problem includes:    Toprol Xl 25 Mg Xr24h-tab (Metoprolol succinate) ..... Once daily  Orders: Echocardiogram (Echo) Nuclear Stress Test (Nuc Stress Test)  Problem # 2:  PALPITATIONS (ICD-785.1) No evidence of arrhythmia on Holter monitor. He is currently wearing a 21 day event monitor. I will ask Dr. Lorenz Coaster to send me a copy of the event monitor when it is complete.   His updated medication list for this problem includes:    Toprol Xl 25 Mg Xr24h-tab (Metoprolol succinate) ..... Once daily  Other Orders: EKG w/ Interpretation (93000)  Patient Instructions: 1)  Your physician recommends that you schedule a follow-up appointment in: 3-4 weeks 2)  Your physician has requested that you have an echocardiogram.  Echocardiography is a painless test that uses sound waves to create images of your heart. It provides  your doctor with information about the size and shape of your heart and how well your heart's chambers and valves are working.  This procedure takes approximately one hour. There are no restrictions for this procedure. 3)  Your physician has requested that you have an exercise stress myoview.  For further information please visit [email or web address]  Please follow instruction sheet, as given.  Appended Document: np6/palps/cp/jss Received event monitor from Dr. Ginny Forth office. Sinus rhythm. NO SVT or VT.

## 2010-05-30 NOTE — Letter (Signed)
Summary: [facility] Practice Office Note  [facility] Office Note   Imported By: Roderic Ovens 07/12/2009 14:52:04  _____________________________________________________________________  External Attachment:    Type:   Image     Comment:   External Document

## 2010-06-22 ENCOUNTER — Telehealth (INDEPENDENT_AMBULATORY_CARE_PROVIDER_SITE_OTHER): Payer: Self-pay | Admitting: *Deleted

## 2010-06-27 NOTE — Progress Notes (Signed)
  DDS Request received sent to [facility]  June 22, 2010 1:09 PM

## 2010-07-13 LAB — CBC
Hemoglobin: 15.6 g/dL (ref 13.0–17.0)
MCH: 32.7 pg (ref 26.0–34.0)
MCH: 33 pg (ref 26.0–34.0)
MCHC: 32.9 g/dL (ref 30.0–36.0)
MCV: 99.2 fL (ref 78.0–100.0)
Platelets: 207 10*3/uL (ref 150–400)
RBC: 4.41 MIL/uL (ref 4.22–5.81)
RBC: 4.76 MIL/uL (ref 4.22–5.81)
RDW: 12.5 % (ref 11.5–15.5)
WBC: 9.8 10*3/uL (ref 4.0–10.5)

## 2010-07-13 LAB — URINALYSIS, ROUTINE W REFLEX MICROSCOPIC
Bilirubin Urine: NEGATIVE
Hgb urine dipstick: NEGATIVE
Nitrite: NEGATIVE
Specific Gravity, Urine: 1.011 (ref 1.005–1.030)
Urobilinogen, UA: 0.2 mg/dL (ref 0.0–1.0)
pH: 5.5 (ref 5.0–8.0)

## 2010-07-13 LAB — LIPASE, BLOOD: Lipase: 34 U/L (ref 23–300)

## 2010-07-13 LAB — COMPREHENSIVE METABOLIC PANEL
ALT: 16 U/L (ref 0–53)
Albumin: 4.3 g/dL (ref 3.5–5.2)
Alkaline Phosphatase: 65 U/L (ref 39–117)
BUN: 5 mg/dL — ABNORMAL LOW (ref 6–23)
CO2: 26 mEq/L (ref 19–32)
Calcium: 9.7 mg/dL (ref 8.4–10.5)
Chloride: 103 mEq/L (ref 96–112)
Chloride: 104 mEq/L (ref 96–112)
Creatinine, Ser: 1 mg/dL (ref 0.4–1.5)
GFR calc non Af Amer: >60 mL/min (ref >60)
GFR calc non Af Amer: >60 mL/min (ref >60)
Glucose, Bld: 82 mg/dL (ref 70–99)
Glucose, Bld: 98 mg/dL (ref 70–99)
Potassium: 3.8 mEq/L (ref 3.5–5.1)
Sodium: 143 mEq/L (ref 135–145)
Total Bilirubin: 1.5 mg/dL — ABNORMAL HIGH (ref 0.3–1.2)
Total Bilirubin: 1.6 mg/dL — ABNORMAL HIGH (ref 0.3–1.2)

## 2010-07-13 LAB — DIFFERENTIAL
Basophils Absolute: 0.4 10*3/uL — ABNORMAL HIGH (ref 0.0–0.1)
Basophils Relative: 4 % — ABNORMAL HIGH (ref 0–1)
Eosinophils Absolute: 0 10*3/uL (ref 0.0–0.7)
Eosinophils Relative: 0 % (ref 0–5)
Lymphocytes Relative: 15 % (ref 12–46)
Lymphocytes Relative: 29 % (ref 12–46)
Lymphs Abs: 2.5 10*3/uL (ref 0.7–4.0)
Monocytes Absolute: 0.8 10*3/uL (ref 0.1–1.0)
Neutro Abs: 7.4 10*3/uL (ref 1.7–7.7)
Neutrophils Relative %: 75 % (ref 43–77)

## 2010-07-13 LAB — POCT CARDIAC MARKERS
CKMB, poc: <1 ng/mL — ABNORMAL LOW (ref 1.0–8.0)
CKMB, poc: <1 ng/mL — ABNORMAL LOW (ref 1.0–8.0)
CKMB, poc: <1 ng/mL — ABNORMAL LOW (ref 1.0–8.0)
Myoglobin, poc: 40.8 ng/mL (ref 12–200)
Troponin i, poc: <0.05 ng/mL (ref 0.00–0.09)
Troponin i, poc: <0.05 ng/mL (ref 0.00–0.09)

## 2010-07-20 LAB — BASIC METABOLIC PANEL
CO2: 28 mEq/L (ref 19–32)
Calcium: 8.7 mg/dL (ref 8.4–10.5)
GFR calc Af Amer: >60 mL/min (ref >60)
GFR calc non Af Amer: >60 mL/min (ref >60)
Glucose, Bld: 78 mg/dL (ref 70–99)
Potassium: 3.9 mEq/L (ref 3.5–5.1)
Sodium: 138 mEq/L (ref 135–145)

## 2010-07-20 LAB — DIFFERENTIAL
Eosinophils Relative: 2 % (ref 0–5)
Lymphocytes Relative: 33 % (ref 12–46)
Lymphs Abs: 2.1 10*3/uL (ref 0.7–4.0)
Monocytes Absolute: 0.4 10*3/uL (ref 0.1–1.0)
Monocytes Relative: 6 % (ref 3–12)
Neutro Abs: 3.6 10*3/uL (ref 1.7–7.7)

## 2010-07-20 LAB — CBC
HCT: 43.4 % (ref 39.0–52.0)
Hemoglobin: 14.6 g/dL (ref 13.0–17.0)
RBC: 4.4 MIL/uL (ref 4.22–5.81)
RDW: 12.2 % (ref 11.5–15.5)

## 2010-07-20 LAB — POCT CARDIAC MARKERS
CKMB, poc: <1 ng/mL — ABNORMAL LOW (ref 1.0–8.0)
Troponin i, poc: <0.05 ng/mL (ref 0.00–0.09)

## 2010-07-31 LAB — POCT URINALYSIS DIP (DEVICE)
Bilirubin Urine: NEGATIVE
Glucose, UA: NEGATIVE mg/dL
Ketones, ur: NEGATIVE mg/dL
Nitrite: NEGATIVE
pH: 7 (ref 5.0–8.0)

## 2010-07-31 LAB — URINE CULTURE
Colony Count: NO GROWTH
Culture: NO GROWTH

## 2010-07-31 LAB — HEMOCCULT GUIAC POC 1CARD (OFFICE): Fecal Occult Bld: NEGATIVE

## 2010-09-15 NOTE — Op Note (Signed)
NAME:  William [name], William [name]                         ACCOUNT NO.:  [id number]   MEDICAL RECORD NO.:  [id number]                   PATIENT TYPE:  AMB   LOCATION:  ENDO                                 FACILITY:   PHYSICIAN:  Anselmo Rod, M.D.               DATE OF BIRTH:  1960/06/27   DATE OF PROCEDURE:  09/25/2002  DATE OF DISCHARGE:                                 OPERATIVE REPORT   PROCEDURE:  Colonoscopy with biopsy.   ENDOSCOPIST:  Anselmo Rod, M.D.   INSTRUMENT USED:  Olympus video colonoscope.   INDICATIONS FOR PROCEDURE:  A 51 year old African-American male with a  history of abnormal weight loss and diarrhea.  Random biopsies planned to  rule out collagenous versus microscopic colitis, rule out colon polyps,  masses, etc.   PREPROCEDURE PREPARATION:  Informed consent was secured from the patient.  The patient had fasted for eight hours prior to the procedure.   PREPROCEDURE PHYSICAL:  The patient has stable vital signs.  Neck supple.  Chest clear to auscultation.  Heart S1, S2, regular.  Abdomen soft with  normal bowel sounds.   DESCRIPTION OF PROCEDURE:  The patient was placed in the left lateral  decubitus position and sedated with 50 mg of Demerol and 5 mg of Versed  intravenously.  Once the patient was adequately sedated he was maintained on  low-flow oxygen and continuous cardiac monitoring.  The Olympus video  colonoscope was advanced from the rectum to the cecum with slight  difficulty.  The patient had some stool in the colon.  Multiple washes were  done.  A few early diverticula were seen in the left colon.  No masses,  polyps or erosions were appreciated.  Random biopsies were done from the  colon to rule out collagenous versus microscopic colitis.  The appendiceal  orifice and ileocecal valve were clearly visualized and photographed.  The  terminal ileum was not visualized in spite of several attempts made to do  so.  The patient tolerated the procedure  well without complications.  Retroflexion in the rectum revealed no acute abnormalities.   IMPRESSION:  1. Few early left-sided diverticula in the colon.  2. Random biopsies done for microscopic versus collagenous colitis.  3. No masses or polyps seen.  4. Normal colon.    RECOMMENDATIONS:  1. Await pathology results.  2. Outpatient followup in the next two weeks with further recommendation.                                               Anselmo Rod, M.D.    JNM/MEDQ  D:  09/25/2002  T:  09/26/2002  Job:  [id number]   cc:   Reuben Likes, M.D.  [address]  [place]  [place] [address]  Fax:  [phone]  

## 2011-05-24 ENCOUNTER — Ambulatory Visit: Payer: Self-pay | Admitting: Family Medicine

## 2011-05-24 DIAGNOSIS — G43009 Migraine without aura, not intractable, without status migrainosus: Secondary | ICD-10-CM

## 2011-05-25 ENCOUNTER — Encounter: Payer: Self-pay | Admitting: Family Medicine

## 2011-07-11 ENCOUNTER — Ambulatory Visit: Payer: Self-pay | Admitting: Emergency Medicine

## 2011-07-11 VITALS — BP 115/79 | HR 103 | Temp 98.0°F | Resp 16 | Ht 70.0 in | Wt 145.0 lb

## 2011-07-11 DIAGNOSIS — IMO0001 Reserved for inherently not codable concepts without codable children: Secondary | ICD-10-CM

## 2011-07-11 DIAGNOSIS — R509 Fever, unspecified: Secondary | ICD-10-CM

## 2011-07-11 DIAGNOSIS — J329 Chronic sinusitis, unspecified: Secondary | ICD-10-CM

## 2011-07-11 LAB — POCT CBC
HCT, POC: 47.4 % (ref 43.5–53.7)
Lymph, poc: 2 (ref 0.6–3.4)
MCHC: 31.2 g/dL — AB (ref 31.8–35.4)
MID (cbc): 0.8 (ref 0–0.9)
POC Granulocyte: 9.2 — AB (ref 2–6.9)
POC LYMPH PERCENT: 17 %L (ref 10–50)
Platelet Count, POC: 234 10*3/uL (ref 142–424)
RDW, POC: 13 %

## 2011-07-11 LAB — POCT INFLUENZA A/B: Influenza B, POC: NEGATIVE

## 2011-07-11 MED ORDER — CEFDINIR 300 MG PO CAPS: 300.0000 mg | ORAL_CAPSULE | Freq: Two times a day (BID) | ORAL | Status: DC

## 2011-07-11 MED ORDER — AMOXICILLIN 500 MG PO CAPS: 500.0000 mg | ORAL_CAPSULE | Freq: Three times a day (TID) | ORAL | Status: DC

## 2011-07-11 NOTE — Progress Notes (Signed)
  Subjective:    Patient ID: William [name], male    DOB: 08/11/1960, 51 y.o.   MRN: [id number]  HPI patient enters with onset Monday of head congestion sore throat sinus drainage. He said chills and severe myalgias. Her flu shot this year. He does not do well with cough medications because they cause urinary retention.    Review of Systems he has significant GI disorder and has been on chronic Reglan therapy for gastroparesis. He is under the care of a gastroenterologist for this problem.     Objective:   Physical Exam  Constitutional: He appears well-developed and well-nourished.  HENT:  Right Ear: External ear normal.  Left Ear: External ear normal.  Eyes: Pupils are equal, round, and reactive to light.  Neck: No JVD present. No tracheal deviation present. No thyromegaly present.  Cardiovascular: Normal rate.  Exam reveals no gallop and no friction rub.   No murmur heard. Pulmonary/Chest: No respiratory distress. He has no wheezes. He has no rales. He exhibits no tenderness.  Abdominal: He exhibits no distension and no mass. There is no tenderness. There is no rebound and no guarding.  Lymphadenopathy:    He has no cervical adenopathy.          Assessment & Plan:   Patient presents with flulike symptoms headache myalgia sore throat cough. Check a strep test and flu swab. The test was negative strep test was negative white count was elevated consistent with a acute sinusitis.

## 2011-07-15 ENCOUNTER — Telehealth: Payer: Self-pay

## 2011-07-15 NOTE — Telephone Encounter (Signed)
Spoke with Dr. Cleta Alberts, he states that he advised patient he could do a payment plan, but this is based on [employer] payment plan policy.  Pt was notified and will CB Monday to discuss this with billing office.  Also, patient would like rx for cough.  He states that Robitussin with CDN has worked well in past.  Can we rx to [facility]?  Pls advise.

## 2011-07-15 NOTE — Telephone Encounter (Signed)
.  [facility] Patient called and stated that Dr. Cleta Alberts had told patient that he could be on a payment plan since he is uninsured.  Patient wanted to clarify that this was the case if he came in to be seen.  Patient stated he was told he could pay a third of the cost of the visit.  Please clarify this with Dr. Cleta Alberts and notify patient.  Patient's phone number is [phone].

## 2011-07-16 NOTE — Telephone Encounter (Signed)
Patient notified that medication can not be called in, would have to be seen for cough.  He was here on 07/11/11 and his cough is not getting better. He is asking if he can get anything called in for cough with out having to come in?

## 2011-07-16 NOTE — Telephone Encounter (Signed)
Needs OV for cough syrup with codeine, contact the billing office to set up payment plan if desires OV.

## 2011-07-17 ENCOUNTER — Telehealth: Payer: Self-pay

## 2011-07-17 NOTE — Telephone Encounter (Signed)
Spoke to pt , still coughing yellow sputum, however it it becoming lighter in appearance.  Would  like something for his cough.   Pt still taking amoxicillin. . Advised to finish all antibiotic, however if cough persists or worsens after meds are completed, he should call back and make [place] aware.lighter

## 2011-07-17 NOTE — Telephone Encounter (Signed)
Pt would like a call regarding cough medicine.

## 2011-10-08 ENCOUNTER — Ambulatory Visit: Payer: Self-pay | Admitting: Emergency Medicine

## 2011-10-08 VITALS — BP 105/71 | HR 91 | Temp 97.5°F | Resp 16 | Ht 71.0 in | Wt 150.0 lb

## 2011-10-08 DIAGNOSIS — F329 Major depressive disorder, single episode, unspecified: Secondary | ICD-10-CM

## 2011-10-08 DIAGNOSIS — R109 Unspecified abdominal pain: Secondary | ICD-10-CM

## 2011-10-08 DIAGNOSIS — E785 Hyperlipidemia, unspecified: Secondary | ICD-10-CM

## 2011-10-08 DIAGNOSIS — K3184 Gastroparesis: Secondary | ICD-10-CM

## 2011-10-08 LAB — POCT CBC
HCT, POC: 45.2 % (ref 43.5–53.7)
Hemoglobin: 14.4 g/dL (ref 14.1–18.1)
MCH, POC: 30.3 pg (ref 27–31.2)
MPV: 9.5 fL (ref 0–99.8)
POC Granulocyte: 13.1 — AB (ref 2–6.9)
POC MID %: 5.4 %M (ref 0–12)
RBC: 4.76 M/uL (ref 4.69–6.13)
WBC: 16.2 10*3/uL — AB (ref 4.6–10.2)

## 2011-10-08 LAB — LIPID PANEL
Cholesterol: 221 mg/dL — ABNORMAL HIGH (ref 0–200)
HDL: 48 mg/dL (ref >39)
Total CHOL/HDL Ratio: 4.6 Ratio
VLDL: 18 mg/dL (ref 0–40)

## 2011-10-08 LAB — COMPREHENSIVE METABOLIC PANEL
Alkaline Phosphatase: 51 U/L (ref 39–117)
BUN: 11 mg/dL (ref 6–23)
Creat: 1.11 mg/dL (ref 0.50–1.35)
Glucose, Bld: 87 mg/dL (ref 70–99)
Total Bilirubin: 0.7 mg/dL (ref 0.3–1.2)

## 2011-10-08 MED ORDER — MIRTAZAPINE 45 MG PO TABS: 30.0000 mg | ORAL_TABLET | Freq: Every day | ORAL | Status: DC

## 2011-10-08 MED ORDER — CLONAZEPAM 2 MG PO TABS: 2.0000 mg | ORAL_TABLET | Freq: Every evening | ORAL | Status: DC | PRN

## 2011-10-08 MED ORDER — OXYCODONE-ACETAMINOPHEN 5-325 MG PO TABS: 1.0000 | ORAL_TABLET | Freq: Three times a day (TID) | ORAL | Status: AC | PRN

## 2011-10-08 MED ORDER — ONDANSETRON HCL 4 MG PO TABS: 4.0000 mg | ORAL_TABLET | Freq: Three times a day (TID) | ORAL | Status: DC | PRN

## 2011-10-08 NOTE — Patient Instructions (Addendum)
Please call [employer] and check with them about having oxygen in the home place. Please call Dr. Imagene Gurney office and get an appointment to get started on Botox.

## 2011-10-08 NOTE — Progress Notes (Signed)
  Subjective:    Patient ID: William [name], male    DOB: Sep 10, 1960, 51 y.o.   MRN: [id number]  HPI patient here with multiple issues. He is a former patient of Dr. Lorenz Coaster. He has been to see Dr. Milus Glazier at [facility]. He is bothered with chronic abdominal pain chronic migraines. He tried some prednisone Saturday however it seemed to make his stomach hurt more. He is currently on Reglan for gastroparesis and that seems to make his depression worse. He'll talk with Dr. Emilio Math about receiving Botox injections for his migraines.    Review of Systems     Objective:   Physical Exam  Constitutional: He is oriented to person, place, and time. He appears well-developed and well-nourished.  HENT:  Head: Normocephalic.  Left Ear: External ear normal.  Eyes: Pupils are equal, round, and reactive to light.  Neck: Normal range of motion. Neck supple.  Cardiovascular: Normal rate.   Pulmonary/Chest: Effort normal and breath sounds normal.  Abdominal: Soft. There is no tenderness. There is no rebound.  Neurological: He is alert and oriented to person, place, and time. He has normal reflexes.  Skin: Skin is warm and dry.          Assessment & Plan:  Patient with multiple medical problems chronic migraines, abdominal pain, gastroparesis, severe depression. Have advised him to make a followup appointment I will also give him 2 L for 15 minutes and see if this helps him with his headache. Been discussed with him the possibility of getting Botox injections . Patient had significant improvement with O2 administration he is now ready to go.

## 2012-01-31 ENCOUNTER — Ambulatory Visit: Payer: Self-pay | Admitting: Family Medicine

## 2012-02-02 ENCOUNTER — Ambulatory Visit: Payer: Self-pay | Admitting: Emergency Medicine

## 2012-02-02 VITALS — BP 133/80 | HR 96 | Temp 97.7°F | Resp 16 | Ht 70.75 in | Wt 141.0 lb

## 2012-02-02 DIAGNOSIS — R51 Headache: Secondary | ICD-10-CM

## 2012-02-02 DIAGNOSIS — F411 Generalized anxiety disorder: Secondary | ICD-10-CM

## 2012-02-02 DIAGNOSIS — F329 Major depressive disorder, single episode, unspecified: Secondary | ICD-10-CM

## 2012-02-02 DIAGNOSIS — F419 Anxiety disorder, unspecified: Secondary | ICD-10-CM

## 2012-02-02 MED ORDER — CLONAZEPAM 2 MG PO TABS: 2.0000 mg | ORAL_TABLET | Freq: Every evening | ORAL | Status: DC | PRN

## 2012-02-02 MED ORDER — CLONAZEPAM 2 MG PO TABS: 2.0000 mg | ORAL_TABLET | Freq: Two times a day (BID) | ORAL | Status: DC | PRN

## 2012-02-02 MED ORDER — OXYCODONE-ACETAMINOPHEN 5-500 MG PO CAPS: 1.0000 | ORAL_CAPSULE | ORAL | Status: DC | PRN

## 2012-02-02 MED ORDER — ONDANSETRON 8 MG PO TBDP: 8.0000 mg | ORAL_TABLET | Freq: Three times a day (TID) | ORAL | Status: DC | PRN

## 2012-02-02 MED ORDER — MIRTAZAPINE 45 MG PO TABS: 45.0000 mg | ORAL_TABLET | Freq: Every day | ORAL | Status: DC

## 2012-02-02 NOTE — Progress Notes (Signed)
  Subjective:    Patient ID: William [name], male    DOB: [birth year], 51 y.o.   MRN: [id number]  HPI Patient presents with a Migraine and need for medication refills. PCP for migraines is Dr. Sandria Manly.  Patient states that he is unable to get an appointment with Dr. Sandria Manly until 3 months from now. Patient states his Social Security benefits were approved for history of migraines. His history of migraines as well as abdominal pain goes back for years. No definite etiology has ever been found and he has not responded well to medications in the past. The last time he was here he received oxygen therapy and it did improve his headaches. He is here requesting refills of his Klonopin and oxycodone. He states he did on-call Dr. Sol Blazing but was unable to get in.     Review of Systems     Objective:   Physical Exam patient is alert and cooperative. There are no focal cranial nerve signs. His neck is supple. His chest was clear. Patellar reflexes were 2+ and symmetrical motor strength is 5 out of 5 in all muscle groups.        Assessment & Plan:  Patient presents with chronic migraines as well as chronic anxiety depression. I've refilled his medications. I've encouraged him to go ahead and call Dr. Alena Bills 'office and a followup appointment in the near future. I encouraged him to call for for followup appointment to see Dr. Audria Nine or  Dr. Clelia Croft. Give the patient 30 minutes of O2 to see if he can get some immediate relief. Patient did have significant improvement with oxygen therapy

## 2012-03-06 ENCOUNTER — Ambulatory Visit: Payer: Self-pay | Admitting: Family Medicine

## 2012-03-20 ENCOUNTER — Ambulatory Visit (INDEPENDENT_AMBULATORY_CARE_PROVIDER_SITE_OTHER): Payer: Medicare Other | Admitting: Family Medicine

## 2012-03-20 ENCOUNTER — Encounter: Payer: Self-pay | Admitting: Family Medicine

## 2012-03-20 VITALS — BP 116/74 | HR 102 | Temp 98.2°F | Resp 16 | Ht 70.0 in | Wt 135.2 lb

## 2012-03-20 DIAGNOSIS — R51 Headache: Secondary | ICD-10-CM

## 2012-03-20 DIAGNOSIS — K3184 Gastroparesis: Secondary | ICD-10-CM

## 2012-03-20 DIAGNOSIS — G43909 Migraine, unspecified, not intractable, without status migrainosus: Secondary | ICD-10-CM

## 2012-03-20 NOTE — Progress Notes (Signed)
51 yo man with chronic headaches and abdominal pain.  He sees Dr. Dulce Sellar for the abdominal pain (gastroparesis) and Dr. Sandria Manly for the migraines.  Nothing has helped him with his chronic pain and he has been deemed disabled.  He was going to the ED frequently and so Dr. Lorenz Coaster wrote him for Tylox.    Patient wants a primary care physician.  He has seen Dr. Cleta Alberts.  He takes klonipin and Remeron.  He is now enrolled in a pain clinic and has been started on Nucynta  Objective:  NAD, thin adult male No gross neuro defects. Discussed his chronic pain situation x 25 minutes.  Assessment:  Chronic pain, uncontrolled.  This will be a difficult patient. Because Dr. Sandria Manly is retiring, I will try to set up another neurologist to take over with a fresh perspective.  Also, the gastroparesis is almost certainly affected by the narcotics.  He seems reticent to take more than 2 or 3 narcotic tabs per week because of his fear of addiction, which is reassuring.  Plan: Will refill tylox and phenergan for a month. Follow up with pain clinic with Dr. Roderic Ovens (scheduled for 1 week from now)  Let him know the Nucynta is not effective. 1. Headache  Ambulatory referral to Neurology  2. Gastroparesis    3. Migraine  Ambulatory referral to Neurology   Continue his follow up with Dr. Dulce Sellar

## 2012-03-24 ENCOUNTER — Telehealth: Payer: Self-pay

## 2012-03-24 NOTE — Telephone Encounter (Signed)
Pt is calling to get an update on a referral to a headache specialist

## 2012-04-04 ENCOUNTER — Encounter: Payer: Self-pay | Admitting: Family Medicine

## 2012-04-04 ENCOUNTER — Ambulatory Visit (INDEPENDENT_AMBULATORY_CARE_PROVIDER_SITE_OTHER): Payer: Medicare Other | Admitting: Family Medicine

## 2012-04-04 VITALS — BP 106/66 | HR 101 | Temp 97.8°F | Resp 16 | Ht 70.0 in | Wt 134.0 lb

## 2012-04-04 DIAGNOSIS — J029 Acute pharyngitis, unspecified: Secondary | ICD-10-CM

## 2012-04-04 DIAGNOSIS — E785 Hyperlipidemia, unspecified: Secondary | ICD-10-CM | POA: Insufficient documentation

## 2012-04-04 DIAGNOSIS — E559 Vitamin D deficiency, unspecified: Secondary | ICD-10-CM

## 2012-04-04 DIAGNOSIS — G43909 Migraine, unspecified, not intractable, without status migrainosus: Secondary | ICD-10-CM

## 2012-04-04 LAB — COMPREHENSIVE METABOLIC PANEL
ALT: 14 U/L (ref 0–53)
AST: 18 U/L (ref 0–37)
Albumin: 4.5 g/dL (ref 3.5–5.2)
Alkaline Phosphatase: 49 U/L (ref 39–117)
BUN: 8 mg/dL (ref 6–23)
CO2: 22 mEq/L (ref 19–32)
Calcium: 9.5 mg/dL (ref 8.4–10.5)
Chloride: 108 mEq/L (ref 96–112)
Creat: 1.07 mg/dL (ref 0.50–1.35)
Glucose, Bld: 77 mg/dL (ref 70–99)
Potassium: 4.1 mEq/L (ref 3.5–5.3)
Sodium: 139 mEq/L (ref 135–145)
Total Bilirubin: 0.8 mg/dL (ref 0.3–1.2)
Total Protein: 7.1 g/dL (ref 6.0–8.3)

## 2012-04-04 LAB — CBC
HCT: 41.1 % (ref 39.0–52.0)
Hemoglobin: 14.7 g/dL (ref 13.0–17.0)
MCH: 31.9 pg (ref 26.0–34.0)
MCHC: 35.8 g/dL (ref 30.0–36.0)
MCV: 89.2 fL (ref 78.0–100.0)
Platelets: 281 10*3/uL (ref 150–400)
RBC: 4.61 MIL/uL (ref 4.22–5.81)
RDW: 13.1 % (ref 11.5–15.5)
WBC: 5.4 10*3/uL (ref 4.0–10.5)

## 2012-04-04 LAB — LIPID PANEL
Cholesterol: 185 mg/dL (ref 0–200)
HDL: 44 mg/dL (ref >39)
LDL Cholesterol: 125 mg/dL — ABNORMAL HIGH (ref 0–99)
Total CHOL/HDL Ratio: 4.2 Ratio
Triglycerides: 78 mg/dL (ref <150)
VLDL: 16 mg/dL (ref 0–40)

## 2012-04-04 LAB — TSH: TSH: 0.663 u[IU]/mL (ref 0.350–4.500)

## 2012-04-04 MED ORDER — MOMETASONE FUROATE 50 MCG/ACT NA SUSP: 2.0000 | Freq: Every day | NASAL | Status: DC

## 2012-04-04 NOTE — Progress Notes (Signed)
51 yo bman with headaches and gastroparesis.  He has a neurology appt for Dec 13.    He wants complete blood work today

## 2012-04-04 NOTE — Patient Instructions (Addendum)
Ask Dr. Anner Crete about using a patch called Lidoderm which might help as much as the bupivicaine shot that helped so much

## 2012-04-05 LAB — VITAMIN D 25 HYDROXY (VIT D DEFICIENCY, FRACTURES): Vit D, 25-Hydroxy: 17 ng/mL — ABNORMAL LOW (ref 30–89)

## 2012-04-12 ENCOUNTER — Ambulatory Visit (INDEPENDENT_AMBULATORY_CARE_PROVIDER_SITE_OTHER): Payer: Medicare Other | Admitting: Emergency Medicine

## 2012-04-12 VITALS — BP 100/70 | HR 80 | Temp 97.8°F | Resp 18 | Wt 137.0 lb

## 2012-04-12 DIAGNOSIS — M542 Cervicalgia: Secondary | ICD-10-CM

## 2012-04-12 DIAGNOSIS — R51 Headache: Secondary | ICD-10-CM

## 2012-04-12 DIAGNOSIS — G894 Chronic pain syndrome: Secondary | ICD-10-CM

## 2012-04-12 MED ORDER — LIDOCAINE 5 % EX PTCH: 1.0000 | MEDICATED_PATCH | CUTANEOUS | Status: DC

## 2012-04-12 NOTE — Patient Instructions (Addendum)
Be sure to call Dr. Roderic Ovens at the [facility] on Monday. Be sure you see Dr. Sandria Manly on Tuesday for your followup appointment.

## 2012-04-12 NOTE — Progress Notes (Signed)
  Subjective:    Patient ID: William [name], male    DOB: 04-Jan-1961, 51 y.o.   MRN: [id number]  HPI this patient has suffered from chronic headaches for many years. He currently goes to [facility] under the care of Dr. Roderic Ovens. Last drug he tried was [place] but it did not help. On Thursday he went to [facility] and had injections in his neck. Prior to this he had injections in the temporal area of without improvement. He is scheduled to see Dr. Sandria Manly on Tuesday. He is here requesting lidocaine patches.    Review of Systems     Objective:   Physical Exam patient is alert and cooperative. He has not in any distress. He is tenderness over the posterior cervical area bilaterally where he received his injections on Thursday. He has good range of motion in his neck. He has no focal neurological signs. Cranial nerves II through XII intact. Deep tendon reflexes present and symmetrical biceps and knees. He has no focal weakness on exam        Assessment & Plan:  Patient has a history of chronic migraines chronic abdominal pain chronic pain syndrome. I told him to call Dr. Roderic Ovens on Monday and see what his recommendations are regarding pain medications. He has an appointment to see Dr. Cleta Alberts D. to discuss management of his headaches. I will give the patient followed lidocaine patches and see if this is is of some benefit.

## 2012-04-16 ENCOUNTER — Other Ambulatory Visit: Payer: Self-pay | Admitting: Neurology

## 2012-04-16 DIAGNOSIS — G43009 Migraine without aura, not intractable, without status migrainosus: Secondary | ICD-10-CM

## 2012-04-18 ENCOUNTER — Ambulatory Visit
Admission: RE | Admit: 2012-04-18 | Discharge: 2012-04-18 | Disposition: A | Discharge status: Home / Self Care | Payer: Medicare Other | Source: Ambulatory Visit | Attending: Neurology | Admitting: Neurology

## 2012-04-18 DIAGNOSIS — G43009 Migraine without aura, not intractable, without status migrainosus: Secondary | ICD-10-CM

## 2012-04-18 MED ORDER — GADOBENATE DIMEGLUMINE 529 MG/ML IV SOLN
13.0000 mL | Freq: Once | INTRAVENOUS | Status: AC | PRN
  Administered 2012-04-18: 13 mL via INTRAVENOUS

## 2012-04-19 ENCOUNTER — Other Ambulatory Visit: Payer: Self-pay

## 2012-04-22 ENCOUNTER — Ambulatory Visit (INDEPENDENT_AMBULATORY_CARE_PROVIDER_SITE_OTHER): Payer: Medicare Other | Admitting: Emergency Medicine

## 2012-04-22 ENCOUNTER — Other Ambulatory Visit: Payer: Self-pay

## 2012-04-22 VITALS — BP 120/86 | HR 86 | Temp 98.3°F | Resp 16 | Ht 70.0 in | Wt 130.4 lb

## 2012-04-22 DIAGNOSIS — R634 Abnormal weight loss: Secondary | ICD-10-CM

## 2012-04-22 DIAGNOSIS — Z0000 Encounter for general adult medical examination without abnormal findings: Secondary | ICD-10-CM

## 2012-04-22 DIAGNOSIS — R109 Unspecified abdominal pain: Secondary | ICD-10-CM

## 2012-04-22 DIAGNOSIS — R51 Headache: Secondary | ICD-10-CM

## 2012-04-22 LAB — COMPREHENSIVE METABOLIC PANEL
ALT: 13 U/L (ref 0–53)
AST: 13 U/L (ref 0–37)
Albumin: 4.7 g/dL (ref 3.5–5.2)
Alkaline Phosphatase: 50 U/L (ref 39–117)
BUN: 12 mg/dL (ref 6–23)
CO2: 26 mEq/L (ref 19–32)
Calcium: 9.6 mg/dL (ref 8.4–10.5)
Chloride: 104 mEq/L (ref 96–112)
Creat: 1.04 mg/dL (ref 0.50–1.35)
Glucose, Bld: 80 mg/dL (ref 70–99)
Potassium: 3.8 mEq/L (ref 3.5–5.3)
Sodium: 141 mEq/L (ref 135–145)
Total Bilirubin: 0.9 mg/dL (ref 0.3–1.2)
Total Protein: 7.4 g/dL (ref 6.0–8.3)

## 2012-04-22 LAB — POCT CBC
Granulocyte percent: 67.3 %G (ref 37–80)
HCT, POC: 45.7 % (ref 43.5–53.7)
Hemoglobin: 14 g/dL — AB (ref 14.1–18.1)
Lymph, poc: 2.6 (ref 0.6–3.4)
MCHC: 30.6 g/dL — AB (ref 31.8–35.4)
POC Granulocyte: 6.7 (ref 2–6.9)
RBC: 4.61 M/uL — AB (ref 4.69–6.13)

## 2012-04-22 LAB — POCT URINALYSIS DIPSTICK
Bilirubin, UA: NEGATIVE
Glucose, UA: NEGATIVE
Ketones, UA: NEGATIVE
Leukocytes, UA: NEGATIVE

## 2012-04-22 LAB — POCT UA - MICROSCOPIC ONLY
Bacteria, U Microscopic: NEGATIVE
Casts, Ur, LPF, POC: NEGATIVE
Crystals, Ur, HPF, POC: NEGATIVE
Mucus, UA: NEGATIVE
Yeast, UA: NEGATIVE

## 2012-04-22 LAB — PSA: PSA: 0.51 ng/mL (ref <=4.00)

## 2012-04-22 LAB — LIPID PANEL
Cholesterol: 200 mg/dL (ref 0–200)
LDL Cholesterol: 130 mg/dL — ABNORMAL HIGH (ref 0–99)
Triglycerides: 97 mg/dL (ref <150)
VLDL: 19 mg/dL (ref 0–40)

## 2012-04-22 NOTE — Patient Instructions (Addendum)
Please contact her GI doctor so you can schedule your colonoscopy. Please continue your Tylox taper as recommended by Dr. love. Please see me in 4-6 weeks when you're on 2 Tylox per week.

## 2012-04-22 NOTE — Progress Notes (Signed)
@[email or web address] @  Patient ID: William [name] MRN: [id number], DOB: 1961/02/11 51 y.o. Date of Encounter: 04/22/2012, 8:31 AM  Primary Physician: Tally Due, MD  Chief Complaint: Physical (CPE)  HPI: 51 y.o. y/o male with history noted below here for CPE.  Doing well. He has   Review of Systems: Consitutional:  + fatigue,+ activity change.  No fever, chills, night sweats,or lymphadenopathy. Eyes: No visual changes, eye redness, or discharge. ENT/Mouth: Ears: + tinnitus.  No otalgia, hearing loss, discharge. Nose: No congestion, rhinorrhea, sinus pain, or epistaxis. Throat: No sore throat, post nasal drip, or teeth pain. Cardiovascular: No CP, palpitations, diaphoresis, DOE, edema, orthopnea, PND. Respiratory: No cough, hemoptysis, SOB, or wheezing. Gastrointestinal: Having severe abdominal pain, positive N/V/D, and constipation.  No anorexia, dysphagia, reflux, hematemesis, BRBPR, or melena. Genitourinary: Positive dysuria.  No signs of frequency, urgency, hematuria, incontinence, nocturia, decreased urinary stream, discharge, impotence, or testicular pain/masses. Musculoskeletal: Has neck pain and stiffness.  No decreased ROM, joint swelling, or weakness. Skin: No rash, erythema, lesion changes, pain, warmth, jaundice, or pruritis. Neurological: Pt has light-headedness, speech difficulty, headaches, and severe migraines.   No syncope, seizures, tremors, memory loss, coordination problems, or paresthesias. Psychological: No anxiety, depression, hallucinations, SI/HI. Endocrine: No fatigue, polydipsia, polyphagia, polyuria, or known diabetes. All other systems were reviewed and are otherwise negative.  Past Medical History  Diagnosis Date  . Allergy   . Depression   . Migraine headache   . Anxiety   . Mitral valve prolapse      No past surgical history on file.  Home Meds:  Prior to Admission medications   Medication Sig Start Date End Date Taking? Authorizing Provider   clonazePAM (KLONOPIN) 2 MG tablet Take 1 tablet (2 mg total) by mouth 2 (two) times daily as needed. 02/02/12  Yes Collene Gobble, MD  lidocaine (LIDODERM) 5 % Place 1 patch onto the skin daily. Remove & Discard patch within 12 hours or as directed by MD 04/12/12  Yes Collene Gobble, MD  metoCLOPramide (REGLAN) 5 MG tablet Take 5 mg by mouth 2 (two) times daily as needed.    Yes Historical Provider, MD  mirtazapine (REMERON) 45 MG tablet Take 1 tablet (45 mg total) by mouth at bedtime. Take one tablet at bedtime 02/02/12  Yes Collene Gobble, MD  oxyCODONE-acetaminophen (TYLOX) 5-500 MG per capsule Take 1 capsule by mouth every 4 (four) hours as needed. 02/02/12  Yes Collene Gobble, MD  promethazine (PHENERGAN) 25 MG tablet Take 25 mg by mouth as needed.   Yes Historical Provider, MD  isometheptene-acetaminophen-dichloralphenazone (MIDRIN) 65-325-100 MG capsule Take 1 capsule by mouth 4 (four) times daily as needed.    Historical Provider, MD  mometasone (NASONEX) 50 MCG/ACT nasal spray Place 2 sprays into the nose daily. 04/04/12   Elvina Sidle, MD  ondansetron (ZOFRAN) 4 MG tablet Take 1 tablet (4 mg total) by mouth every 8 (eight) hours as needed. 10/08/11   Collene Gobble, MD  topiramate (TOPAMAX) 25 MG tablet Take 25 mg by mouth 2 (two) times daily.    Historical Provider, MD    Allergies:  Allergies  Allergen Reactions  . Cefdinir   . Cephalexin   . Cephalosporins   . Erythromycin     REACTION: Reaction not known  . Ketorolac Tromethamine     REACTION: Reaction not known  . Latex     REACTION: Reaction not known  . Nalbuphine     REACTION: Reaction not known  .  Prednisone Nausea And Vomiting  . Prochlorperazine Edisylate     REACTION: shakes/tremors  . Promethazine Hcl     REACTION: Reaction not known    History   Social History  . Marital Status: Married    Spouse Name: N/A    Number of Children: N/A  . Years of Education: N/A   Occupational History  . Not on file.    Social History Main Topics  . Smoking status: Never Smoker   . Smokeless tobacco: Not on file  . Alcohol Use: No  . Drug Use: No  . Sexually Active: Not on file   Other Topics Concern  . Not on file   Social History Narrative  . No narrative on file    Family History  Problem Relation Age of Onset  . Hypertension Mother   . Hypertension Brother     Physical Exam: Blood pressure 120/86, pulse 86, temperature 98.3 F (36.8 C), temperature source Oral, resp. rate 16, height 5\' 10"  (1.778 m), weight 130 lb 6.4 oz (59.149 kg), SpO2 100.00%.  General: Well developed, well nourished, in no acute distress. HEENT: Normocephalic, atraumatic. Conjunctiva pink, sclera non-icteric. Pupils 2 mm constricting to 1 mm, round, regular, and equally reactive to light and accomodation. EOMI. Internal auditory canal clear. TMs with good cone of light and without pathology. Nasal mucosa pink. Nares are without discharge. No sinus tenderness. Oral mucosa pink. Dentition. Pharynx without exudate.   Neck: Supple. Trachea midline. No thyromegaly. Full ROM. No lymphadenopathy. Lungs: Clear to auscultation bilaterally without wheezes, rales, or rhonchi. Breathing is of normal effort and unlabored. Cardiovascular: RRR with S1 S2. No murmurs, rubs, or gallops appreciated. Distal pulses 2+ symmetrically. No carotid or abdominal bruits. Abdomen: Soft, non-tender, non-distended with normoactive bowel sounds. No hepatosplenomegaly or masses. No rebound/guarding. No CVA tenderness. Without hernias.  Rectal: No external hemorrhoids or fissures. Rectal vault without masses.  Genitourinary:    circumcised male. No penile lesions. Testes descended bilaterally, and smooth without tenderness or masses.  Musculoskeletal: Full range of motion and 5/5 strength throughout. Without swelling, atrophy, tenderness, crepitus, or warmth. Extremities without clubbing, cyanosis, or edema. Calves supple. Skin: Warm and moist without  erythema, ecchymosis, wounds, or rash. Neuro: A+Ox3. CN II-XII grossly intact. Moves all extremities spontaneously. Full sensation throughout. Normal gait. DTR 2+ throughout upper and lower extremities. Finger to nose intact. Psych:  Responds to questions appropriately with a normal affect.   Studies: CBC, CMET, Lipid, PSA, TSH,all pending.  EKG normal Results for orders placed in visit on 04/22/12  IFOBT (OCCULT BLOOD)      Component Value Range   IFOBT Negative    POCT CBC      Component Value Range   WBC 9.9  4.6 - 10.2 K/uL   Lymph, poc 2.6  0.6 - 3.4   POC LYMPH PERCENT 26.4  10 - 50 %L   MID (cbc) 0.6  0 - 0.9   POC MID % 6.3  0 - 12 %M   POC Granulocyte 6.7  2 - 6.9   Granulocyte percent 67.3  37 - 80 %G   RBC 4.61 (*) 4.69 - 6.13 M/uL   Hemoglobin 14.0 (*) 14.1 - 18.1 g/dL   HCT, POC 40.9  81.1 - 53.7 %   MCV 99.2 (*) 80 - 97 fL   MCH, POC 30.4  27 - 31.2 pg   MCHC 30.6 (*) 31.8 - 35.4 g/dL   RDW, POC 91.4     Platelet Count,  POC 150  142 - 424 K/uL   MPV 9.9  0 - 99.8 fL  POCT URINALYSIS DIPSTICK      Component Value Range   Color, UA yellow     Clarity, UA clear     Glucose, UA neg     Bilirubin, UA neg     Ketones, UA neg     Spec Grav, UA 1.015     Blood, UA small     pH, UA 6.5     Protein, UA neg     Urobilinogen, UA 0.2     Nitrite, UA neg     Leukocytes, UA Negative    POCT UA - MICROSCOPIC ONLY      Component Value Range   WBC, Ur, HPF, POC 0-1     RBC, urine, microscopic 0-1     Bacteria, U Microscopic neg     Mucus, UA neg     Epithelial cells, urine per micros 0-1     Crystals, Ur, HPF, POC neg     Casts, Ur, LPF, POC neg     Yeast, UA neg       Assessment/Plan:  51 y.o. African American male who is been bothered with chronic anxiety chronic abdominal pain chronic migraines. He has been under the care of Dr. love for years for his headaches. [facility] [place] under the care of Dr. Roderic Ovens for  injections. He is scheduled for what is presumed to be an electrolytes his VII nerve in the near future. He currently is trying to be tapered off his Clonopin and tapered off his Tylox. He currently is taking Tylox 3 per week for 3 weeks then Tylox two a week for 3 weeks then down to Tylox one a week for 3 weeks. He is also tapering his dose of Klonopin. He has been losing weight. Routine labs were done as well as a B12 and folic acid for his elevated MCV. He has a history of vitamin D deficiency and this was also done .  -  Signed, Earl Lites, MD 04/22/2012 8:31 AM

## 2012-05-12 ENCOUNTER — Telehealth: Payer: Self-pay

## 2012-05-12 NOTE — Telephone Encounter (Signed)
Pt wants to talk Dr. Elbert Ewings about his recent referral to [facility] and what he thinks about pt using Botox treatments . He has an appt with Dr. Sandria Manly for this on Jan 22 and he is worried to have this done. [facility] will do RF (radio frequency) "burn nerving" per pt, what do you think about that?  [phone]

## 2012-05-13 NOTE — Telephone Encounter (Signed)
This all sounds complicated, do you want me to have patient follow up in office with you?

## 2012-05-13 NOTE — Telephone Encounter (Signed)
I would be happy to discuss this with patient further, but I have no appointment times until March and I am unable to come in to the clinic during my unscheduled hours per clinic directive. 

## 2012-05-13 NOTE — Telephone Encounter (Signed)
Thank you, I have called patient to advise on your hours at the Urgent Care center, he will come in this week end.

## 2012-05-17 ENCOUNTER — Encounter: Payer: Self-pay | Admitting: Family Medicine

## 2012-05-17 ENCOUNTER — Ambulatory Visit (INDEPENDENT_AMBULATORY_CARE_PROVIDER_SITE_OTHER): Payer: Medicare Other | Admitting: Family Medicine

## 2012-05-17 VITALS — BP 98/68 | HR 100 | Temp 98.7°F | Resp 16 | Ht 70.0 in | Wt 133.8 lb

## 2012-05-17 DIAGNOSIS — R42 Dizziness and giddiness: Secondary | ICD-10-CM

## 2012-05-17 DIAGNOSIS — R51 Headache: Secondary | ICD-10-CM

## 2012-05-17 MED ORDER — OXYCODONE-ACETAMINOPHEN 5-500 MG PO CAPS: 1.0000 | ORAL_CAPSULE | ORAL | Status: DC | PRN

## 2012-05-17 NOTE — Progress Notes (Signed)
52 yo man suffering from chronic neck and back pain.  He has gone to the pain clinic and neurologists.  He is considering RF lesion, [facility]  Dr. Anner Crete did a pupivicaine, lidocaine, tramcinolone injection which was very painful.  He is tapering clonazepam over 5 months.  He is also trying to wean off tylox.  He has not been able to stay on schedule.  He is running out of the tylox.  He took 4 tylox this entire last week.  He last saw Dr. Avie Echevaria (retiring in March of this year).  Objective:  NAD CN III-XII intact Motor:  Intact  Face to face discussion with patient x 25 minutes  1. Headache  oxyCODONE-acetaminophen (TYLOX) 5-500 MG per capsule

## 2012-05-17 NOTE — Patient Instructions (Signed)
Take the Tylox 3 tablets per week for 2 weeks,  Then 2 tablets per week x 2 weeks, then 1 tablet per week for 3 weeks

## 2012-05-22 ENCOUNTER — Ambulatory Visit (INDEPENDENT_AMBULATORY_CARE_PROVIDER_SITE_OTHER): Payer: Medicare Other | Admitting: Family Medicine

## 2012-05-22 ENCOUNTER — Encounter: Payer: Self-pay | Admitting: Family Medicine

## 2012-05-22 VITALS — BP 110/74 | HR 103 | Temp 98.2°F | Resp 16 | Ht 70.0 in | Wt 131.8 lb

## 2012-05-22 DIAGNOSIS — G43909 Migraine, unspecified, not intractable, without status migrainosus: Secondary | ICD-10-CM

## 2012-05-22 DIAGNOSIS — G43009 Migraine without aura, not intractable, without status migrainosus: Secondary | ICD-10-CM

## 2012-05-22 NOTE — Progress Notes (Signed)
52 yo disabled man with chronic migraine.  He recently went to neurologist for Botox evaluation, and patient with wife decided against Botox.  I have filled out patient's disability forms.  Patient would like to try some natural approaches to migraine management:  Exercise  Objective:  NAD, alert Discussed migraine prevention through exercise (see patient ed) Neuro exam intact.  Assessment:  Chronic migraines and gastroparesis account for disability.  I have the gut feeling that this patient could be a great deal more functional than he is at present  Plan:  I spent 40 minutes reviewing the plan: Taper oxycodone, taper clonazepam, follow up visit with Dr. Sharene Skeans in [place] in March

## 2012-05-22 NOTE — Patient Instructions (Addendum)
10 minutes treadmill starting at 3 mph and gradually increasing the gradient. Then 10 minutes of either stairmaster, elliptical ,exercise bicycle or rowing machine  (your choice) 10 minutes of upper body strengthening on Nautilus   Diet: Reduce white rice, bread, potatoes and pasta.  Concentrate on vegetables (stir fry), fruit and nuts.

## 2012-06-13 ENCOUNTER — Ambulatory Visit (INDEPENDENT_AMBULATORY_CARE_PROVIDER_SITE_OTHER): Payer: Medicare Other | Admitting: Emergency Medicine

## 2012-06-13 VITALS — BP 112/72 | HR 92 | Temp 97.7°F | Resp 16 | Ht 69.7 in | Wt 133.0 lb

## 2012-06-13 DIAGNOSIS — R1013 Epigastric pain: Secondary | ICD-10-CM

## 2012-06-13 DIAGNOSIS — M545 Low back pain: Secondary | ICD-10-CM

## 2012-06-13 LAB — POCT URINALYSIS DIPSTICK
Bilirubin, UA: NEGATIVE
Ketones, UA: NEGATIVE
Leukocytes, UA: NEGATIVE
Spec Grav, UA: 1.01

## 2012-06-13 LAB — POCT UA - MICROSCOPIC ONLY
Bacteria, U Microscopic: NEGATIVE
Crystals, Ur, HPF, POC: NEGATIVE
Epithelial cells, urine per micros: NEGATIVE
RBC, urine, microscopic: NEGATIVE
WBC, Ur, HPF, POC: NEGATIVE

## 2012-06-13 MED ORDER — CYCLOBENZAPRINE HCL 10 MG PO TABS: ORAL_TABLET | ORAL | Status: DC

## 2012-06-13 NOTE — Progress Notes (Signed)
  Subjective:    Patient ID: William [name], male    DOB: [birth year], 52 y.o.   MRN: [id number]  HPI Pt in today with complaints of pain in his stomach and lumbar back area. He uses [facility]. He is looking for capsules of Tylox 5-325 and he has only been able to get tablets 5-500. He believes that the tablets are upsetting his stomach. Denies diarrhea or constipation.   Pt is on schedule to get off of Klonopin. He is on month 2 of a taper dose. He is taking Remeron every night. He is also taking phenergan as needed, the lidocaine patches on his back.     Review of Systems     Objective:   Physical Exam  Lumbar area sensitive to palpitation. Positive reflexes. TM normal bilaterally. Cervical lymph nodes normal.  Chest clear heart regular rate no murmurs abdomen soft nontender there is mild tenderness in the left flank area there is no tenderness over lower lumbar spine or into the SI joints. Deep tendon reflexes knees and ankles are 2+ Results for orders placed in visit on 06/13/12  POCT UA - MICROSCOPIC ONLY      Result Value Range   WBC, Ur, HPF, POC neg     RBC, urine, microscopic neg     Bacteria, U Microscopic neg     Mucus, UA neg     Epithelial cells, urine per micros neg     Crystals, Ur, HPF, POC neg     Casts, Ur, LPF, POC neg     Yeast, UA neg    POCT URINALYSIS DIPSTICK      Result Value Range   Color, UA yellow     Clarity, UA clear     Glucose, UA neg     Bilirubin, UA neg     Ketones, UA neg     Spec Grav, UA 1.010     Blood, UA trace-intact     pH, UA 7.0     Protein, UA neg     Urobilinogen, UA 0.2     Nitrite, UA neg     Leukocytes, UA Negative        Assessment & Plan:  Patient is going to [facility] to see what medications are available for him to take. He is to continue on his current medications and I've given him Flexeril to use for the muscle discomfort of the left lumbar area.

## 2012-06-14 ENCOUNTER — Other Ambulatory Visit: Payer: Self-pay

## 2012-06-24 ENCOUNTER — Emergency Department (HOSPITAL_COMMUNITY)
Admission: EM | Admit: 2012-06-24 | Discharge: 2012-06-24 | Disposition: A | Discharge status: Home / Self Care | Payer: Medicare Other | Source: Home / Self Care | Attending: Family Medicine | Admitting: Family Medicine

## 2012-06-24 ENCOUNTER — Encounter (HOSPITAL_COMMUNITY): Payer: Self-pay | Admitting: Emergency Medicine

## 2012-06-24 DIAGNOSIS — G43709 Chronic migraine without aura, not intractable, without status migrainosus: Secondary | ICD-10-CM

## 2012-06-24 MED ORDER — DICLOFENAC POTASSIUM 50 MG PO TABS: 50.0000 mg | ORAL_TABLET | Freq: Three times a day (TID) | ORAL | Status: DC

## 2012-06-24 NOTE — ED Provider Notes (Signed)
History     CSN: [id number]  Arrival date & time 06/24/12  1012   First MD Initiated Contact with Patient 06/24/12 1015      Chief Complaint  Patient presents with  . Headache    (Consider location/radiation/quality/duration/timing/severity/associated sxs/prior treatment) Patient is a 52 y.o. male presenting with headaches. The history is provided by the patient.  Headache Pain location:  Generalized (chronic mha, seen by mult specialists and currently dr love who is retiring, ) Quality:  Dull Progression:  Unchanged Chronicity:  Chronic Similar to prior headaches: yes   Relieved by:  Nothing Ineffective treatments:  Prescription medications Associated symptoms: neck pain and neck stiffness   Associated symptoms: no dizziness, no numbness and no seizures     Past Medical History  Diagnosis Date  . Allergy   . Depression   . Migraine headache   . Anxiety   . Mitral valve prolapse     History reviewed. No pertinent past surgical history.  Family History  Problem Relation Age of Onset  . Hypertension Mother   . Hypertension Brother     History  Substance Use Topics  . Smoking status: Never Smoker   . Smokeless tobacco: Not on file  . Alcohol Use: No      Review of Systems  Constitutional: Negative.   HENT: Positive for neck pain and neck stiffness.   Neurological: Positive for headaches. Negative for dizziness, seizures, speech difficulty, weakness, light-headedness and numbness.    Allergies  Cefdinir; Cephalexin; Cephalosporins; Erythromycin; Ketorolac tromethamine; Latex; Nalbuphine; Prednisone; Prochlorperazine edisylate; and Promethazine hcl  Home Medications   Current Outpatient Rx  Name  Route  Sig  Dispense  Refill  . clonazePAM (KLONOPIN) 2 MG tablet   Oral   Take 2 mg by mouth at bedtime. Take 1/2 to 1 tablet at bedtime as needed.         . cyclobenzaprine (FLEXERIL) 10 MG tablet      Take one half to one tablet 3 times a day as a  muscle relaxant   30 tablet   0   . diclofenac (CATAFLAM) 50 MG tablet   Oral   Take 1 tablet (50 mg total) by mouth 3 (three) times daily. For headache   30 tablet   0   . diclofenac (CATAFLAM) 50 MG tablet   Oral   Take 1 tablet (50 mg total) by mouth 3 (three) times daily.   30 tablet   0   . isometheptene-acetaminophen-dichloralphenazone (MIDRIN) 65-325-100 MG capsule   Oral   Take 1 capsule by mouth 4 (four) times daily as needed.         . lidocaine (LIDODERM) 5 %   Transdermal   Place 1 patch onto the skin daily. Remove & Discard patch within 12 hours or as directed by MD   30 patch   5   . metoCLOPramide (REGLAN) 5 MG tablet   Oral   Take 5 mg by mouth 2 (two) times daily as needed.          . mirtazapine (REMERON) 45 MG tablet   Oral   Take 1 tablet (45 mg total) by mouth at bedtime. Take one tablet at bedtime   30 tablet   11   . mometasone (NASONEX) 50 MCG/ACT nasal spray   Nasal   Place 2 sprays into the nose daily.   17 g   12   . ondansetron (ZOFRAN) 4 MG tablet   Oral   Take 1  tablet (4 mg total) by mouth every 8 (eight) hours as needed.   20 tablet   1   . oxyCODONE-acetaminophen (TYLOX) 5-500 MG per capsule   Oral   Take 1 capsule by mouth every 4 (four) hours as needed.   30 capsule   0   . promethazine (PHENERGAN) 25 MG tablet   Oral   Take 25 mg by mouth as needed.         . topiramate (TOPAMAX) 25 MG tablet   Oral   Take 25 mg by mouth 2 (two) times daily.           BP 118/73  Pulse 94  Temp(Src) 97.6 F (36.4 C) (Oral)  Resp 16  SpO2 100%  Physical Exam  Nursing note and vitals reviewed. Constitutional: He is oriented to person, place, and time. He appears well-developed and well-nourished.  Musculoskeletal: He exhibits tenderness.       Cervical back: He exhibits decreased range of motion, tenderness and spasm. He exhibits normal pulse.  Neurological: He is alert and oriented to person, place, and time.  Skin:  Skin is warm and dry.    ED Course  Procedures (including critical care time)  Labs Reviewed - No data to display No results found.   1. Headache, chronic migraine without aura       MDM          Linna Hoff, MD 06/24/12 [phone]

## 2012-06-24 NOTE — ED Notes (Signed)
C/o headache, requesting dr Lorenz Coaster.

## 2012-07-01 ENCOUNTER — Ambulatory Visit (INDEPENDENT_AMBULATORY_CARE_PROVIDER_SITE_OTHER): Payer: Medicare Other | Admitting: Internal Medicine

## 2012-07-01 VITALS — BP 108/66 | HR 120 | Temp 98.0°F | Resp 18 | Ht 70.5 in | Wt 136.6 lb

## 2012-07-01 DIAGNOSIS — R05 Cough: Secondary | ICD-10-CM

## 2012-07-01 DIAGNOSIS — J157 Pneumonia due to Mycoplasma pneumoniae: Secondary | ICD-10-CM

## 2012-07-01 DIAGNOSIS — J029 Acute pharyngitis, unspecified: Secondary | ICD-10-CM

## 2012-07-01 DIAGNOSIS — R509 Fever, unspecified: Secondary | ICD-10-CM

## 2012-07-01 LAB — POCT CBC
HCT, POC: 46.4 % (ref 43.5–53.7)
Hemoglobin: 14.4 g/dL (ref 14.1–18.1)
MCH, POC: 30.6 pg (ref 27–31.2)
MCV: 98.5 fL — AB (ref 80–97)
RBC: 4.71 M/uL (ref 4.69–6.13)
WBC: 10.2 10*3/uL (ref 4.6–10.2)

## 2012-07-01 MED ORDER — AZITHROMYCIN 250 MG PO TABS: ORAL_TABLET | ORAL | Status: DC

## 2012-07-01 NOTE — Progress Notes (Signed)
  Subjective:    Patient ID: William [name], male    DOB: 1960-11-16, 52 y.o.   MRN: [id number]  HPI Pharyngitis for 3 days leading to cough and fever last night Chest hurts with cough/some sputum production   Review of Systems     Objective:   Physical Exam Vital signs stable TMs clear/nares clear/throat injected without exudate No anterior cervical nodes Chest with rhonchi that clear with cough       Results for orders placed in visit on 07/01/12  POCT CBC      Result Value Range   WBC 10.2  4.6 - 10.2 K/uL   Lymph, poc 1.1  0.6 - 3.4   POC LYMPH PERCENT 10.4  10 - 50 %L   MID (cbc) 0.6  0 - 0.9   POC MID % 5.6  0 - 12 %M   POC Granulocyte 8.6 (*) 2 - 6.9   Granulocyte percent 84.0 (*) 37 - 80 %G   RBC 4.71  4.69 - 6.13 M/uL   Hemoglobin 14.4  14.1 - 18.1 g/dL   HCT, POC 40.9  81.1 - 53.7 %   MCV 98.5 (*) 80 - 97 fL   MCH, POC 30.6  27 - 31.2 pg   MCHC 31.0 (*) 31.8 - 35.4 g/dL   RDW, POC 91.4     Platelet Count, POC 256  142 - 424 K/uL   MPV 9.0  0 - 99.8 fL    Assessment & Plan:  Fever, unspecified  Acute pharyngitis Cough  Mycoplasma pneumonia likely  Zithromax with food/ OTC cough meds F/u not well 1 week

## 2012-07-03 ENCOUNTER — Emergency Department (HOSPITAL_BASED_OUTPATIENT_CLINIC_OR_DEPARTMENT_OTHER): Payer: Medicare Other

## 2012-07-03 ENCOUNTER — Emergency Department (HOSPITAL_COMMUNITY)
Admission: EM | Admit: 2012-07-03 | Discharge: 2012-07-03 | Disposition: A | Discharge status: Home / Self Care | Payer: Medicare Other | Attending: Emergency Medicine | Admitting: Emergency Medicine

## 2012-07-03 ENCOUNTER — Ambulatory Visit (INDEPENDENT_AMBULATORY_CARE_PROVIDER_SITE_OTHER): Payer: Medicare Other | Admitting: Emergency Medicine

## 2012-07-03 ENCOUNTER — Encounter (HOSPITAL_BASED_OUTPATIENT_CLINIC_OR_DEPARTMENT_OTHER): Payer: Self-pay | Admitting: *Deleted

## 2012-07-03 ENCOUNTER — Emergency Department (HOSPITAL_BASED_OUTPATIENT_CLINIC_OR_DEPARTMENT_OTHER)
Admission: EM | Admit: 2012-07-03 | Discharge: 2012-07-03 | Disposition: A | Discharge status: Home / Self Care | Payer: Medicare Other | Attending: Emergency Medicine | Admitting: Emergency Medicine

## 2012-07-03 ENCOUNTER — Encounter (HOSPITAL_COMMUNITY): Payer: Self-pay | Admitting: Unknown Physician Specialty

## 2012-07-03 ENCOUNTER — Encounter: Payer: Self-pay | Admitting: Emergency Medicine

## 2012-07-03 VITALS — BP 115/62 | HR 126 | Temp 98.8°F | Resp 16 | Ht 70.0 in | Wt 134.0 lb

## 2012-07-03 DIAGNOSIS — R079 Chest pain, unspecified: Secondary | ICD-10-CM

## 2012-07-03 DIAGNOSIS — Z8659 Personal history of other mental and behavioral disorders: Secondary | ICD-10-CM | POA: Insufficient documentation

## 2012-07-03 DIAGNOSIS — F411 Generalized anxiety disorder: Secondary | ICD-10-CM | POA: Insufficient documentation

## 2012-07-03 DIAGNOSIS — G43909 Migraine, unspecified, not intractable, without status migrainosus: Secondary | ICD-10-CM | POA: Insufficient documentation

## 2012-07-03 DIAGNOSIS — R062 Wheezing: Secondary | ICD-10-CM | POA: Insufficient documentation

## 2012-07-03 DIAGNOSIS — R011 Cardiac murmur, unspecified: Secondary | ICD-10-CM | POA: Insufficient documentation

## 2012-07-03 DIAGNOSIS — Z8679 Personal history of other diseases of the circulatory system: Secondary | ICD-10-CM | POA: Insufficient documentation

## 2012-07-03 DIAGNOSIS — Z79899 Other long term (current) drug therapy: Secondary | ICD-10-CM | POA: Insufficient documentation

## 2012-07-03 DIAGNOSIS — J189 Pneumonia, unspecified organism: Secondary | ICD-10-CM

## 2012-07-03 DIAGNOSIS — R6883 Chills (without fever): Secondary | ICD-10-CM | POA: Insufficient documentation

## 2012-07-03 DIAGNOSIS — R059 Cough, unspecified: Secondary | ICD-10-CM | POA: Insufficient documentation

## 2012-07-03 DIAGNOSIS — F3289 Other specified depressive episodes: Secondary | ICD-10-CM | POA: Insufficient documentation

## 2012-07-03 DIAGNOSIS — F329 Major depressive disorder, single episode, unspecified: Secondary | ICD-10-CM | POA: Insufficient documentation

## 2012-07-03 DIAGNOSIS — R000 Tachycardia, unspecified: Secondary | ICD-10-CM | POA: Insufficient documentation

## 2012-07-03 DIAGNOSIS — IMO0002 Reserved for concepts with insufficient information to code with codable children: Secondary | ICD-10-CM | POA: Insufficient documentation

## 2012-07-03 DIAGNOSIS — R072 Precordial pain: Secondary | ICD-10-CM | POA: Insufficient documentation

## 2012-07-03 LAB — CBC WITH DIFFERENTIAL/PLATELET
Basophils Absolute: 0 10*3/uL (ref 0.0–0.1)
Basophils Relative: 0 % (ref 0–1)
Basophils Relative: 0 % (ref 0–1)
Eosinophils Absolute: 0.2 10*3/uL (ref 0.0–0.7)
Eosinophils Absolute: 0 10*3/uL (ref 0.0–0.7)
Eosinophils Relative: 0 % (ref 0–5)
HCT: 42.4 % (ref 39.0–52.0)
HCT: 38.9 % — ABNORMAL LOW (ref 39.0–52.0)
Hemoglobin: 14 g/dL (ref 13.0–17.0)
Hemoglobin: 13 g/dL (ref 13.0–17.0)
Lymphocytes Relative: 11 % — ABNORMAL LOW (ref 12–46)
Lymphs Abs: 1 10*3/uL (ref 0.7–4.0)
MCH: 31.3 pg (ref 26.0–34.0)
MCH: 31 pg (ref 26.0–34.0)
MCHC: 33 g/dL (ref 30.0–36.0)
MCHC: 33.4 g/dL (ref 30.0–36.0)
MCV: 92.8 fL (ref 78.0–100.0)
Monocytes Absolute: 1 10*3/uL (ref 0.1–1.0)
Monocytes Absolute: 1.1 10*3/uL — ABNORMAL HIGH (ref 0.1–1.0)
Monocytes Relative: 11 % (ref 3–12)
Monocytes Relative: 12 % (ref 3–12)
Neutro Abs: 6.9 10*3/uL (ref 1.7–7.7)
Neutrophils Relative %: 77 % (ref 43–77)
Platelets: 166 10*3/uL (ref 150–400)
RBC: 4.19 MIL/uL — ABNORMAL LOW (ref 4.22–5.81)
RDW: 12.5 % (ref 11.5–15.5)
WBC: 9 10*3/uL (ref 4.0–10.5)

## 2012-07-03 LAB — BASIC METABOLIC PANEL
BUN: 9 mg/dL (ref 6–23)
BUN: 7 mg/dL (ref 6–23)
CO2: 24 mEq/L (ref 19–32)
Calcium: 9.4 mg/dL (ref 8.4–10.5)
Calcium: 9.2 mg/dL (ref 8.4–10.5)
Chloride: 101 mEq/L (ref 96–112)
Chloride: 102 mEq/L (ref 96–112)
Creatinine, Ser: 1.13 mg/dL (ref 0.50–1.35)
GFR calc Af Amer: 85 mL/min — ABNORMAL LOW (ref >90)
GFR calc non Af Amer: 75 mL/min — ABNORMAL LOW (ref >90)
GFR calc non Af Amer: 73 mL/min — ABNORMAL LOW (ref >90)
Glucose, Bld: 85 mg/dL (ref 70–99)
Glucose, Bld: 75 mg/dL (ref 70–99)
Potassium: 4.3 mEq/L (ref 3.5–5.1)
Sodium: 138 mEq/L (ref 135–145)

## 2012-07-03 LAB — TROPONIN I: Troponin I: <0.3 ng/mL (ref <0.30)

## 2012-07-03 MED ORDER — MUCINEX DM 30-600 MG PO TB12: 1.0000 | ORAL_TABLET | Freq: Two times a day (BID) | ORAL | Status: DC

## 2012-07-03 MED ORDER — SODIUM CHLORIDE 0.9 % IV BOLUS (SEPSIS)
1000.0000 mL | Freq: Once | INTRAVENOUS | Status: AC
  Administered 2012-07-03 (×2): 1000 mL via INTRAVENOUS

## 2012-07-03 MED ORDER — IOHEXOL 350 MG/ML SOLN
80.0000 mL | Freq: Once | INTRAVENOUS | Status: AC | PRN
  Administered 2012-07-03: 80 mL via INTRAVENOUS

## 2012-07-03 MED ORDER — LEVOFLOXACIN 750 MG PO TABS: 750.0000 mg | ORAL_TABLET | Freq: Every day | ORAL | Status: DC

## 2012-07-03 NOTE — ED Provider Notes (Signed)
History     CSN: [id number]  Arrival date & time 07/03/12  0255   First MD Initiated Contact with Patient 07/03/12 0303      Chief Complaint  Patient presents with  . Cough    (Consider location/radiation/quality/duration/timing/severity/associated sxs/prior treatment) Patient is a 52 y.o. male presenting with cough. The history is provided by the patient and the spouse.  Cough Cough characteristics:  Productive Sputum characteristics:  Yellow Severity:  Moderate Onset quality:  Gradual Duration:  5 days Timing:  Constant Progression:  Worsening Chronicity:  New Smoker: no   Context: not smoke exposure   Relieved by:  Nothing Worsened by:  Nothing tried Ineffective treatments: zpak. Associated symptoms: chills and wheezing   Associated symptoms: no diaphoresis and no fever   Diagnosed early in the week with a lower respiratory tract infection but is not getting better.  Chest congestion and cough and chills and sweats unchanged.   Past Medical History  Diagnosis Date  . Allergy   . Depression   . Migraine headache   . Anxiety   . Mitral valve prolapse     History reviewed. No pertinent past surgical history.  Family History  Problem Relation Age of Onset  . Hypertension Mother   . Hypertension Brother     History  Substance Use Topics  . Smoking status: Never Smoker   . Smokeless tobacco: Not on file  . Alcohol Use: No      Review of Systems  Constitutional: Positive for chills. Negative for fever and diaphoresis.  HENT: Negative for neck pain and neck stiffness.   Respiratory: Positive for cough and wheezing.   All other systems reviewed and are negative.    Allergies  Cefdinir; Cephalexin; Cephalosporins; Erythromycin; Ketorolac tromethamine; Latex; Nalbuphine; Prednisone; Prochlorperazine edisylate; and Promethazine hcl  Home Medications   Current Outpatient Rx  Name  Route  Sig  Dispense  Refill  . azithromycin (ZITHROMAX) 250 MG tablet       Take 2 pills today then one a day for 4 additional days   6 tablet   0   . clonazePAM (KLONOPIN) 2 MG tablet   Oral   Take 2 mg by mouth at bedtime. Take 1/2 to 1 tablet at bedtime as needed.         . cyclobenzaprine (FLEXERIL) 10 MG tablet      Take one half to one tablet 3 times a day as a muscle relaxant   30 tablet   0   . lidocaine (LIDODERM) 5 %   Transdermal   Place 1 patch onto the skin daily. Remove & Discard patch within 12 hours or as directed by MD   30 patch   5   . metoCLOPramide (REGLAN) 5 MG tablet   Oral   Take 5 mg by mouth 2 (two) times daily as needed.          . mirtazapine (REMERON) 45 MG tablet   Oral   Take 1 tablet (45 mg total) by mouth at bedtime. Take one tablet at bedtime   30 tablet   11   . mometasone (NASONEX) 50 MCG/ACT nasal spray   Nasal   Place 2 sprays into the nose daily.   17 g   12   . ondansetron (ZOFRAN) 4 MG tablet   Oral   Take 1 tablet (4 mg total) by mouth every 8 (eight) hours as needed.   20 tablet   1   . oxyCODONE-acetaminophen (TYLOX) 5-500  MG per capsule   Oral   Take 1 capsule by mouth every 4 (four) hours as needed.   30 capsule   0   . promethazine (PHENERGAN) 25 MG tablet   Oral   Take 25 mg by mouth as needed.         . topiramate (TOPAMAX) 25 MG tablet   Oral   Take 25 mg by mouth 2 (two) times daily.           BP 113/69  Pulse 120  Temp(Src) 97.8 F (36.6 C) (Oral)  Resp 18  Ht 5\' 10"  (1.778 m)  Wt 134 lb (60.782 kg)  BMI 19.23 kg/m2  SpO2 98%  Physical Exam  Constitutional: He is oriented to person, place, and time. He appears well-developed and well-nourished. No distress.  HENT:  Head: Normocephalic and atraumatic.  Mouth/Throat: Oropharynx is clear and moist.  Eyes: Conjunctivae are normal. Pupils are equal, round, and reactive to light.  Neck: Normal range of motion. Neck supple.  Cardiovascular: Regular rhythm and intact distal pulses.  Tachycardia present.    Pulmonary/Chest: No stridor. No respiratory distress. He has no wheezes. He has rhonchi.  Abdominal: Soft. Bowel sounds are normal. There is no tenderness. There is no rebound and no guarding.  Musculoskeletal: Normal range of motion.  Neurological: He is alert and oriented to person, place, and time.  Skin: Skin is warm and dry.  Psychiatric: He has a normal mood and affect.    ED Course  Procedures (including critical care time)  Labs Reviewed  CBC WITH DIFFERENTIAL  BASIC METABOLIC PANEL   No results found.   No diagnosis found.    MDM  Very mild pneumonia.  Not yet completely treated with antibiotics PORT score and curb 65 score both 0.  No indication for admission.  Will change antibiotic to levaquin and prescribe tesselon, mucinex.  Stop zpak immediately do not take 2 antibiotics concurrently.  Suspect some of the HR is related to the fact that patient is detoxing off pain medication and benzos.  Will need to see his doctor related to these medications.  Have put these instructions on the discharge papers Close follow up with PMD        April K Palumbo-Rasch, MD 07/03/12 [phone]

## 2012-07-03 NOTE — ED Notes (Signed)
Pt c/o cough productive of dark, yellow sputum since Sunday. Pt was seen at [facility] on Tuesday and placed on antibiotics. Pt does not feel better.

## 2012-07-03 NOTE — Progress Notes (Signed)
  Subjective:    Patient ID: William [name], male    DOB: 11/17/1960, 52 y.o.   MRN: [id number]  HPI  Patient presents today after being seen at emergency room with high blood pressure reading.  Patient states he feels like someone is standing on his chest.  He has been having chest pain.  Patient states the ER did a chest xray and a CT Scan.  Patient states he has been taking a Zpak since Tuesday.  States he has been coughing and rattling in his chest.  He has taken OTC Alka Seltzer yesterday. Patient states he is still hurting in his chest. Patient has been having night sweats multiple times a night. He is seen 48 hours ago and started on a Z-Pak. He went to the emergency room about 3 AM this morning with chest pain shortness of breath and tachycardia. He had a CT angiogram which showed a mild infiltrate right upper lobe. He continues to have chest pain continues to feel I tachycardia in his chest.      Review of Systems     Objective:   Physical Exam EENT exam is unremarkable except for rhinorrhea. His neck is supple. His chest is clear to auscultation and percussion. His cardiac exam reveals a regular rate without murmurs I do not hear a rub. Abdomen is soft nontender  EKG sinus tachycardia without acute changes      Assessment & Plan:  Patient here with chest discomfort as well as persistent tachycardia. Test will be done for 24 hours just to see if we can elicit the source of his tachycardia he moves thyroid studies which were done recently have been normal. I am unclear as to the source of his tachycardia. He does have a low-grade pneumonia by CT and he is still complaining of chest pain. Would recommend cardiac enzymes and consideration for echo to rule out pericarditis .

## 2012-07-03 NOTE — ED Provider Notes (Signed)
History    52 year old male with chest pain. Intermittent. Substernal. Lasts minutes. Often associated with cough, but not always. Does not radiate. Patient with recent evaluation in emergency room last night. Get CT angiography of his chest. There is no evidence of pulmonary embolism but there was a small area of his right side concern for possible pneumonia. Patient was changed from azithromycin to levofloxacin. She continued to have chest pain so presented to urgent care and referred back to the emergency room. No fevers or chills. No unusual leg pain or swelling. Denies any drug use. Nonsmoker. No diagnosed history of coronary artery disease. Patient with a normal stress test in March of 2011. Relatively unremarkable echocardiogram around time as well.   CSN: [id number]  Arrival date & time 07/03/12  1533   First MD Initiated Contact with Patient 07/03/12 1540      Chief Complaint  Patient presents with  . Chest Pain    (Consider location/radiation/quality/duration/timing/severity/associated sxs/prior treatment) HPI  Past Medical History  Diagnosis Date  . Allergy   . Depression   . Migraine headache   . Anxiety   . Mitral valve prolapse   . Heart murmur     History reviewed. No pertinent past surgical history.  Family History  Problem Relation Age of Onset  . Hypertension Mother   . Hypertension Brother     History  Substance Use Topics  . Smoking status: Never Smoker   . Smokeless tobacco: Not on file  . Alcohol Use: No      Review of Systems  All systems reviewed and negative, other than as noted in HPI.  Allergies  Cephalexin; Cephalosporins; Erythromycin; Ketorolac tromethamine; Latex; Nalbuphine; Prednisone; Prochlorperazine edisylate; Topamax; and Cefdinir  Home Medications   Current Outpatient Rx  Name  Route  Sig  Dispense  Refill  . clonazePAM (KLONOPIN) 2 MG tablet   Oral   Take 2 mg by mouth at bedtime.          . cyclobenzaprine  (FLEXERIL) 10 MG tablet      Take one half to one tablet 3 times a day as a muscle relaxant   30 tablet   0   . Dextromethorphan-Guaifenesin (MUCINEX DM) 30-600 MG TB12   Oral   Take 1 tablet by mouth 2 (two) times daily.   14 each   0   . levofloxacin (LEVAQUIN) 750 MG tablet   Oral   Take 1 tablet (750 mg total) by mouth daily. X 7 days   7 tablet   0   . lidocaine (LIDODERM) 5 %   Transdermal   Place 1 patch onto the skin daily as needed. Remove & Discard patch within 12 hours or as directed by MD as needed for pain         . metoCLOPramide (REGLAN) 5 MG tablet   Oral   Take 5 mg by mouth 2 (two) times daily as needed. For nausea/vomiting and stomach spasms         . mirtazapine (REMERON) 45 MG tablet   Oral   Take 1 tablet (45 mg total) by mouth at bedtime. Take one tablet at bedtime   30 tablet   11   . oxyCODONE-acetaminophen (TYLOX) 5-500 MG per capsule   Oral   Take 1 capsule by mouth every 4 (four) hours as needed. For pain         . promethazine (PHENERGAN) 25 MG tablet   Oral   Take 25 mg by mouth  every 6 (six) hours as needed. For nausea/vomiting           BP 121/71  Pulse 107  Temp(Src) 98.6 F (37 C) (Oral)  Resp 18  SpO2 98%  Physical Exam  Nursing note and vitals reviewed. Constitutional: He appears well-developed and well-nourished. No distress.  HENT:  Head: Normocephalic and atraumatic.  Eyes: Conjunctivae are normal. Right eye exhibits no discharge. Left eye exhibits no discharge.  Neck: Neck supple.  Cardiovascular: Regular rhythm and normal heart sounds.  Exam reveals no gallop and no friction rub.   No murmur heard. Mild tachycardia. Reg rhythm  Pulmonary/Chest: Effort normal and breath sounds normal. No respiratory distress.  Abdominal: Soft. He exhibits no distension. There is no tenderness.  Musculoskeletal: He exhibits no edema and no tenderness.  Lower extremities symmetric as compared to each other. No calf tenderness.  Negative Homan's. No palpable cords.   Neurological: He is alert.  Skin: Skin is warm and dry.  Psychiatric: He has a normal mood and affect. His behavior is normal. Thought content normal.    ED Course  Procedures (including critical care time)  Labs Reviewed  CBC WITH DIFFERENTIAL  BASIC METABOLIC PANEL  TROPONIN I   Dg Chest 2 View  07/03/2012  *RADIOLOGY REPORT*  Clinical Data: Cough, congestion and chest pain.  CHEST - 2 VIEW  Comparison: Chest radiograph performed 06/13/2009  Findings: The lungs are well-aerated.  Minimal right basilar opacity may reflect mild pneumonia, less well characterized on the lateral view.  There is no evidence of pleural effusion or pneumothorax.  The heart is normal in size; the mediastinal contour is within normal limits.  No acute osseous abnormalities are seen.  IMPRESSION: Minimal right basilar opacity may reflect mild pneumonia.   Original Report Authenticated By: Tonia Ghent, M.D.    Ct Angio Chest Pe W/cm &/or Wo Cm  07/03/2012  *RADIOLOGY REPORT*  Clinical Data: Chest pain, cough and congestion; tachycardia.  CT ANGIOGRAPHY CHEST  Technique:  Multidetector CT imaging of the chest using the standard protocol during bolus administration of intravenous contrast. Multiplanar reconstructed images including MIPs were obtained and reviewed to evaluate the vascular anatomy.  Contrast: 80mL OMNIPAQUE IOHEXOL 350 MG/ML SOLN  Comparison: Chest radiograph performed earlier today at 03:46 a.m.  Findings: There is no evidence of pulmonary embolus.  Minimal airspace opacity is noted within the periphery of the right upper lobe, concerning for mild pneumonia.  There is no evidence of pleural effusion or pneumothorax.  No masses are identified; no abnormal focal contrast enhancement is seen.  The mediastinum is unremarkable in appearance.  No mediastinal lymphadenopathy is seen.  No pericardial effusion is identified. The great vessels grossly unremarkable in appearance.   No axillary lymphadenopathy is seen.  The visualized portions of the thyroid gland are unremarkable in appearance.  The visualized portions of the liver and spleen are unremarkable.  No acute osseous abnormalities are seen.  IMPRESSION:  1.  No evidence of pulmonary embolus. 2.  Minimal airspace opacity in the periphery of the right upper lobe, concerning for mild pneumonia.   Original Report Authenticated By: Tonia Ghent, M.D.    EKG:  Rhythm: sinus tachycardia Vent. rate 106 BPM PR interval 140 ms QRS duration 88 ms QT/QTc 324/430 ms ST segments: NS ST changes   1. Chest pain       MDM  51yM with CP. Pleuritic. Suspect pulmonary process, likely viral URI but recent Ct with questionable pneumonia and abx changed to levaquin.  EKG with no ischemic changes. Pain atypical for acs. Trop normal. I feel safe for DC at this time. Outpt FU.         Raeford Razor, MD 07/08/12 [phone]

## 2012-07-03 NOTE — ED Notes (Signed)
Patient transported to CT 

## 2012-07-03 NOTE — ED Notes (Signed)
Patient transported to X-ray 

## 2012-07-03 NOTE — ED Notes (Signed)
Patient arrived via [facility] from [facility] with intermittent chest pain x4 days. Patient has been seen several urgent cares and [facility]. No cardiac markers have been done. Dr. Cleta Alberts sent patient here for further evaluation and care.

## 2012-07-07 ENCOUNTER — Encounter: Payer: Self-pay | Admitting: Cardiovascular Disease

## 2012-07-07 ENCOUNTER — Ambulatory Visit (INDEPENDENT_AMBULATORY_CARE_PROVIDER_SITE_OTHER): Payer: Medicare Other | Admitting: Cardiovascular Disease

## 2012-07-07 ENCOUNTER — Telehealth: Payer: Self-pay | Admitting: Cardiovascular Disease

## 2012-07-07 VITALS — BP 108/70 | HR 116 | Ht 70.0 in | Wt 132.0 lb

## 2012-07-07 DIAGNOSIS — R0789 Other chest pain: Secondary | ICD-10-CM

## 2012-07-07 DIAGNOSIS — I498 Other specified cardiac arrhythmias: Secondary | ICD-10-CM

## 2012-07-07 DIAGNOSIS — R002 Palpitations: Secondary | ICD-10-CM

## 2012-07-07 DIAGNOSIS — R000 Tachycardia, unspecified: Secondary | ICD-10-CM

## 2012-07-07 NOTE — Progress Notes (Signed)
History of Present Illness: 52 yo AAM with history of palpitations, migraine headaches and irritable bowel syndrome with previous diagnosis of mitral valve prolapse in 1989 who is here today for cardiac follow up. He has not been seen in our office since September 2011. He has been known to have PVCs in the past. He did not tolerate beta blockers in the past. I started him on Cardizem CD 120 mg per day in 2011. He had a normal echo and stress test in 2011. Holter monitor in 2011 with no evidence of SVT, atrial fibrillation. He has not been on his Cardizem over the last two years since his palpitations had resolved. On 06/28/12 he felt sick with sore throat and congestion. On 07/01/12, seen in Urgent Care by Dr. Merla Riches and started on Z-pack. On 07/03/12 he began to have a productive cough and chest pain and was seen in the [facility]. CTA chest negative for PE. He was seen later that day by Dr. Cleta Alberts in the [facility] and given c/o chest pain at that time, was sent to [facility] ED. EKG with sinus tach, rate 107 bpm. Troponin was negative in the ED. He describes sweating, coughing, fever, chills and pleuritic chest pain with cough. He has noticed his heart racing. This has been occurring over last ten days during his acute illness. Sore throat is improved.   Primary Care Physician: [facility], Dr. Perrin Maltese   Past Medical History  Diagnosis Date  . Allergy   . Depression   . Migraine headache   . Anxiety   . Mitral valve prolapse   . Heart murmur     No past surgical history on file.  Current Outpatient Prescriptions  Medication Sig Dispense Refill  . clonazePAM (KLONOPIN) 2 MG tablet Take 2 mg by mouth at bedtime.       . cyclobenzaprine (FLEXERIL) 10 MG tablet Take one half to one tablet 3 times a day as a muscle relaxant  30 tablet  0  . guaiFENesin (MUCINEX) 600 MG 12 hr tablet Take 600 mg by mouth 2 (two) times daily.      [name] levofloxacin (LEVAQUIN) 750 MG tablet Take 1  tablet (750 mg total) by mouth daily. X 7 days  7 tablet  0  . lidocaine (LIDODERM) 5 % Place 1 patch onto the skin daily as needed. Remove & Discard patch within 12 hours or as directed by MD as needed for pain      . metoCLOPramide (REGLAN) 5 MG tablet Take 5 mg by mouth 2 (two) times daily as needed. For nausea/vomiting and stomach spasms      . mirtazapine (REMERON) 45 MG tablet Take 1 tablet (45 mg total) by mouth at bedtime. Take one tablet at bedtime  30 tablet  11  . oxyCODONE-acetaminophen (TYLOX) 5-500 MG per capsule Take 1 capsule by mouth every 4 (four) hours as needed. For pain      . promethazine (PHENERGAN) 25 MG tablet Take 25 mg by mouth every 6 (six) hours as needed. For nausea/vomiting       No current facility-administered medications for this visit.    Allergies  Allergen Reactions  . Cephalexin Diarrhea and Nausea And Vomiting  . Cephalosporins Diarrhea and Nausea And Vomiting  . Erythromycin Nausea And Vomiting  . Ketorolac Tromethamine     REACTION: Reaction not known  . Latex     REACTION: Reaction not known  . Nalbuphine  REACTION: Reaction not known  . Prednisone Nausea And Vomiting  . Prochlorperazine Edisylate     REACTION: shakes/tremors  . Topamax (Topiramate) Diarrhea and Nausea And Vomiting  . Cefdinir Diarrhea, Nausea And Vomiting and Rash    History   Social History  . Marital Status: Married    Spouse Name: N/A    Number of Children: N/A  . Years of Education: N/A   Occupational History  . Not on file.   Social History Main Topics  . Smoking status: Never Smoker   . Smokeless tobacco: Not on file  . Alcohol Use: No  . Drug Use: No  . Sexually Active: Yes   Other Topics Concern  . Not on file   Social History Narrative  . No narrative on file    Family History  Problem Relation Age of Onset  . Hypertension Mother   . Hypertension Brother     Review of Systems:  As stated in the HPI and otherwise negative.   BP 108/70   Pulse 116  Ht 5\' 10"  (1.778 m)  Wt 132 lb (59.875 kg)  BMI 18.94 kg/m2  Physical Examination: General: Well developed, well nourished, NAD HEENT: OP clear, mucus membranes moist SKIN: warm, dry. No rashes. Neuro: No focal deficits Musculoskeletal: Muscle strength 5/5 all ext Psychiatric: Mood and affect normal Neck: No JVD, no carotid bruits, no thyromegaly, no lymphadenopathy. Lungs:Clear bilaterally, no wheezes, rhonci, crackles Cardiovascular: Tachy rate, regular rhythm. No murmurs, gallops or rubs. Abdomen:Soft. Bowel sounds present. Non-tender.  Extremities: No lower extremity edema. Pulses are 2 + in the bilateral DP/PT.  EKG: Sinus tachycardia, rate 105 bpm.   Echo March 2011:  Left ventricle: The cavity size was normal. Wall thickness was normal. Systolic function was normal. The estimated ejection fraction was in the range of 55% to 60%. Wall motion was normal; there were no regional wall motion abnormalities.   Assessment and Plan:   1. Chest pain: Atypical. Pleuritic type pain. Troponin negative in ED. EKG without evidence of ischemia. This is likely from his ongoing URI. Will arrange echo to exclude pericardial effusion. EKG is not c/w pericarditis. This does not sound ischemic  2. Palpitations: Likely secondary to sinus tachycardia in the setting of acute illness. I think this will resolve when he has resolution of URI symptoms but he is concerned. Will arrange 48 hour monitor to exclude associated arrythmias. He is encourage to stay well hydrated while he is recovering from his acute illness.

## 2012-07-07 NOTE — Telephone Encounter (Signed)
Left message to call back  

## 2012-07-07 NOTE — Telephone Encounter (Signed)
Spoke with pt's wife who is asking for pt to be seen today. She reports pt has been diagnosed with walking pneumonia and is on Levaquin. She reports his heart rate is staying 120-123. He does not have a fever. Is sweating a lot. He has been seen twice recently in ED. Pt last saw Dr. Clifton James in September 2011. Wife states palpitations improved so he did not follow up with Dr. Clifton James. No longer taking Cardizem CD. States it was stopped by Dr. Lorenz Coaster because pt was no longer having palpitations. Does not know blood pressure.  Pt saw Dr. Sandria Manly this past Monday. Dr. Sandria Manly had been trying to taper off pt's Klonopin but pt was not able to taper due to headaches.  Schedulers have made appt with Dr. Clifton James on July 09, 2012. Primary care is [facility]. Will review with Dr. Clifton James regarding appt.

## 2012-07-07 NOTE — Telephone Encounter (Signed)
Spoke with wife and pt will come in for appt today at 2 PM

## 2012-07-07 NOTE — Telephone Encounter (Signed)
New Problem:    Patient's wife called in wanting her husband to be seen for high BP and Pulse per his discharge from [facility] over the weekend.  Would like her husband to be seen today.  Please call back.

## 2012-07-07 NOTE — Telephone Encounter (Signed)
We can see at 2pm today. cdm

## 2012-07-07 NOTE — Patient Instructions (Addendum)
Your physician recommends that you schedule a follow-up appointment in:  3 weeks.   Your physician has requested that you have an echocardiogram. Echocardiography is a painless test that uses sound waves to create images of your heart. It provides your doctor with information about the size and shape of your heart and how well your heart's chambers and valves are working. This procedure takes approximately one hour. There are no restrictions for this procedure.  Your physician has recommended that you wear a holter monitor. Holter monitors are medical devices that record the heart's electrical activity. Doctors most often use these monitors to diagnose arrhythmias. Arrhythmias are problems with the speed or rhythm of the heartbeat. The monitor is a small, portable device. You can wear one while you do your normal daily activities. This is usually used to diagnose what is causing palpitations/syncope (passing out).

## 2012-07-08 ENCOUNTER — Ambulatory Visit (INDEPENDENT_AMBULATORY_CARE_PROVIDER_SITE_OTHER): Payer: Medicare Other | Admitting: Family Medicine

## 2012-07-08 VITALS — BP 100/70 | HR 100 | Temp 98.0°F | Resp 20 | Ht 70.0 in | Wt 132.0 lb

## 2012-07-08 DIAGNOSIS — R091 Pleurisy: Secondary | ICD-10-CM

## 2012-07-08 DIAGNOSIS — J189 Pneumonia, unspecified organism: Secondary | ICD-10-CM

## 2012-07-08 MED ORDER — METHYLPREDNISOLONE ACETATE 80 MG/ML IJ SUSP
80.0000 mg | Freq: Once | INTRAMUSCULAR | Status: AC
  Administered 2012-07-08: 80 mg via INTRAMUSCULAR

## 2012-07-08 MED ORDER — LEVOFLOXACIN 750 MG PO TABS: 750.0000 mg | ORAL_TABLET | Freq: Every day | ORAL | Status: DC

## 2012-07-08 NOTE — Addendum Note (Signed)
Addended by: Burnett Kanaris A on: 07/08/2012 11:58 AM   Modules accepted: Orders

## 2012-07-08 NOTE — Progress Notes (Signed)
Patient seen for follow up of pneumonia.  See note

## 2012-07-08 NOTE — Patient Instructions (Signed)
Return on Sunday between 8 and 4

## 2012-07-08 NOTE — Progress Notes (Signed)
52 yo man here for followup for of diagnosed pneumonia 8 days ago.  He had a sore throat initially, was evaluated here and diagnosed with mycoplasma.  He was sent to the ED after treatment with Zpak and then CT was neg for PE and he was started on Levaquin.    Coughing quite a bit.  Not really improving No h/o asthma or pulmonary disease. Seen by cardiology yesterday.  Objective: NAD Color good Chest:  Rub on left Oroph:  Red posterior pharynx  Assessment:  There has been some improvement, but his progress has been unusually slow  Plan:  Refill levaquin Prednisone 20 mg two daily x 5 days. Recheck Sunday.  Elvina Sidle, MD

## 2012-07-09 ENCOUNTER — Ambulatory Visit (INDEPENDENT_AMBULATORY_CARE_PROVIDER_SITE_OTHER): Payer: Medicare Other | Admitting: Emergency Medicine

## 2012-07-09 ENCOUNTER — Telehealth: Payer: Self-pay | Admitting: Radiology

## 2012-07-09 ENCOUNTER — Encounter: Payer: Self-pay | Admitting: Internal Medicine

## 2012-07-09 ENCOUNTER — Ambulatory Visit: Payer: Medicare Other | Admitting: Cardiovascular Disease

## 2012-07-09 VITALS — BP 110/78 | HR 94 | Temp 97.8°F | Resp 16 | Ht 69.0 in | Wt 130.0 lb

## 2012-07-09 DIAGNOSIS — R39198 Other difficulties with micturition: Secondary | ICD-10-CM

## 2012-07-09 DIAGNOSIS — R3989 Other symptoms and signs involving the genitourinary system: Secondary | ICD-10-CM

## 2012-07-09 LAB — POCT UA - MICROSCOPIC ONLY
Bacteria, U Microscopic: NEGATIVE
Casts, Ur, LPF, POC: NEGATIVE
Crystals, Ur, HPF, POC: NEGATIVE

## 2012-07-09 LAB — POCT URINALYSIS DIPSTICK
Bilirubin, UA: NEGATIVE
Blood, UA: NEGATIVE
Nitrite, UA: NEGATIVE
Spec Grav, UA: 1.01
pH, UA: 7

## 2012-07-09 MED ORDER — TAMSULOSIN HCL 0.4 MG PO CAPS: 0.4000 mg | ORAL_CAPSULE | Freq: Every day | ORAL | Status: DC

## 2012-07-09 NOTE — Telephone Encounter (Signed)
Spoke to patient he is calling about his labs for past 4 years (reviewing my chart). He states his GFR has been abnormal. He is Tree surgeon. I have advised him the GFR for African Americans runs lower than other races. He states he has trouble urinating for 2 years. I advised him this is not normal. He is coming in. He wants to see you. He will be here before 3pm, he wants to see you.

## 2012-07-09 NOTE — Telephone Encounter (Signed)
Pt is currently in our office to be seen.  

## 2012-07-09 NOTE — Progress Notes (Signed)
  Subjective:    Patient ID: William [name], male    DOB: 1961-04-20, 52 y.o.   MRN: [id number]  HPI  Patient comes in today because he found lab results in [name] that he has questions about. He has results indicating that he may have Chronic Kidney Disease.   He was in the office yesterday with the same symptoms and saw Dr. Milus Glazier. He was to continue the Levaquin for seven days.   He has been ill for a number of months. He has difficulty with urination. The urine will stop even though he still has to go. His flow is interrupted.   He is having difficulty with erectile dysfunction. He has a history of using Viagra but does not want to use it because of the headache.   Review of Systems     Objective:   Physical Exam patient is alert and cooperative not in distress. Chest exam reveals a few rhonchi but no dullness air exchange is good. There are rub  like sounds are present bilaterally. cardiac exam is unremarkable with a regular rate and rhythm. Abdomen is soft liver spleen not enlarged there no areas of tenderness. Genital exam reveals atrophic testicles penis itself is normal. Prostate is normal size without nodularity  Results for orders placed in visit on 07/09/12  POCT URINALYSIS DIPSTICK      Result Value Range   Color, UA yellow     Clarity, UA clear     Glucose, UA negative     Bilirubin, UA negative     Ketones, UA negative     Spec Grav, UA 1.010     Blood, UA negative     pH, UA 7.0     Protein, UA negative     Urobilinogen, UA 0.2     Nitrite, UA negative     Leukocytes, UA Negative    POCT UA - MICROSCOPIC ONLY      Result Value Range   WBC, Ur, HPF, POC negative     RBC, urine, microscopic negative     Bacteria, U Microscopic negative     Mucus, UA negative     Epithelial cells, urine per micros 0-1     Crystals, Ur, HPF, POC negative     Casts, Ur, LPF, POC negative     Yeast, UA negative    IFOBT (OCCULT BLOOD)      Result Value Range   IFOBT  Negative          Assessment & Plan:  I suspect his urinary symptoms are medication related. We certainly can give him Medrol Flomax and see if that helps. I'm hesitant to stop the medications he is on because he has been on them for a good while. Recent bmett done was within normal limits. Patient to followup his pneumonia on Sunday with Dr. Milus Glazier. Would give him a trial of Flomax to see if it helps with his urination.

## 2012-07-10 ENCOUNTER — Ambulatory Visit (INDEPENDENT_AMBULATORY_CARE_PROVIDER_SITE_OTHER): Payer: Medicare Other

## 2012-07-10 ENCOUNTER — Ambulatory Visit (HOSPITAL_COMMUNITY): Payer: Medicare Other | Attending: Cardiology

## 2012-07-10 DIAGNOSIS — I079 Rheumatic tricuspid valve disease, unspecified: Secondary | ICD-10-CM | POA: Insufficient documentation

## 2012-07-10 DIAGNOSIS — R072 Precordial pain: Secondary | ICD-10-CM | POA: Insufficient documentation

## 2012-07-10 DIAGNOSIS — I059 Rheumatic mitral valve disease, unspecified: Secondary | ICD-10-CM | POA: Insufficient documentation

## 2012-07-10 DIAGNOSIS — R000 Tachycardia, unspecified: Secondary | ICD-10-CM

## 2012-07-10 DIAGNOSIS — R002 Palpitations: Secondary | ICD-10-CM

## 2012-07-10 NOTE — Progress Notes (Signed)
Echocardiogram performed.  

## 2012-07-11 NOTE — Progress Notes (Signed)
Placed patient on a 48 hr holter monitor and also went over instructions on how to use it and when to return it

## 2012-07-13 ENCOUNTER — Ambulatory Visit: Payer: Medicare Other

## 2012-07-13 ENCOUNTER — Telehealth: Payer: Self-pay | Admitting: Family Medicine

## 2012-07-13 ENCOUNTER — Ambulatory Visit (INDEPENDENT_AMBULATORY_CARE_PROVIDER_SITE_OTHER): Payer: Medicare Other | Admitting: Family Medicine

## 2012-07-13 ENCOUNTER — Encounter: Payer: Self-pay | Admitting: Family Medicine

## 2012-07-13 VITALS — BP 132/84 | HR 106 | Temp 97.6°F | Resp 17 | Ht 70.5 in | Wt 130.0 lb

## 2012-07-13 DIAGNOSIS — G4700 Insomnia, unspecified: Secondary | ICD-10-CM

## 2012-07-13 DIAGNOSIS — J189 Pneumonia, unspecified organism: Secondary | ICD-10-CM

## 2012-07-13 LAB — POCT CBC
Granulocyte percent: 66.4 %G (ref 37–80)
HCT, POC: 47.2 % (ref 43.5–53.7)
Hemoglobin: 14.4 g/dL (ref 14.1–18.1)
Lymph, poc: 2.6 (ref 0.6–3.4)
MCH, POC: 30.1 pg (ref 27–31.2)
MCHC: 30.5 g/dL — AB (ref 31.8–35.4)
MCV: 98.5 fL — AB (ref 80–97)
MID (cbc): 0.7 (ref 0–0.9)
MPV: 9.4 fL (ref 0–99.8)
POC Granulocyte: 6.4 (ref 2–6.9)
POC LYMPH PERCENT: 26.7 %L (ref 10–50)
POC MID %: 6.9 %M (ref 0–12)
Platelet Count, POC: 272 10*3/uL (ref 142–424)
RBC: 4.79 M/uL (ref 4.69–6.13)
RDW, POC: 13.1 %
WBC: 9.6 10*3/uL (ref 4.6–10.2)

## 2012-07-13 MED ORDER — ALBUTEROL SULFATE (2.5 MG/3ML) 0.083% IN NEBU
2.5000 mg | INHALATION_SOLUTION | Freq: Once | RESPIRATORY_TRACT | Status: AC
  Administered 2012-07-13: 2.5 mg via RESPIRATORY_TRACT

## 2012-07-13 MED ORDER — METHYLPREDNISOLONE 4 MG PO TABS: ORAL_TABLET | ORAL | Status: DC

## 2012-07-13 MED ORDER — CLONAZEPAM 0.5 MG PO TABS: ORAL_TABLET | ORAL | Status: DC

## 2012-07-13 NOTE — Telephone Encounter (Signed)
Spoke to Pharmacist at [facility] and verified that Pt's starting dose of Klonopin is 2mg  QHS. Today Dr. Elbert Ewings gave Pt 0.5mg  Klonopin to start tapering down his use of the drug. Dr. Elbert Ewings verified with me that he spent time explaining how to break the 2mg  tablet in half and add the 0.5mg  Klonopin as needed to taper his dose over the next 2-4 weeks. Eileen Stanford

## 2012-07-13 NOTE — Progress Notes (Signed)
52 yo man with 2 weeks of coughing and weakness, originally thought to be pneumonia.  Has also undergone cardiac testing all of which so far is normal.  A holter monitor is pending.  No recent fevers.   Some night sweats.   The levaquin will run out in 3 days.  Has missed his last appointment with Dr. Sandria Manly, who is concerned about his followup.  He will need his clonazepam tapered by another physician as Dr. Volanda Napoleon.  Objective:  NAD, appears a bit depressed.  Seen with wife who is very concerned about the coughing spasms.  Chest:  Bilateral ronchi which clears with cough  Patient does improve his breathing symptomatically after a albuterol treatment. He had no increase in pulse after that treatment.  [facility] reading (PRIMARY) by  Dr. Milus Glazier:  CXR shows lowered diaphragms, no infiltrates.  Assessment: Patient is not acutely ill but does have some bronchitis. He tends to be seeking validation for her symptoms and, as he has in the past with other illnesses, complained of symptom severity much greater than appears objective examination.  Pneumonia - Plan: DG Chest 2 View, methylPREDNISolone (MEDROL) 4 MG tablet, Quantiferon tb gold assay, POCT CBC, albuterol (PROVENTIL) (2.5 MG/3ML) 0.083% nebulizer solution 2.5 mg  Insomnia - Plan: clonazePAM (KLONOPIN) 0.5 MG tablet, albuterol (PROVENTIL) (2.5 MG/3ML) 0.083% nebulizer solution 2.5 mg  Follow up 48 hours

## 2012-07-14 ENCOUNTER — Telehealth: Payer: Self-pay | Admitting: Cardiovascular Disease

## 2012-07-14 NOTE — Telephone Encounter (Deleted)
New Problem: ° ° ° °Patient called in wanting to speak with you.  Please call back. °

## 2012-07-15 ENCOUNTER — Ambulatory Visit (INDEPENDENT_AMBULATORY_CARE_PROVIDER_SITE_OTHER): Payer: Medicare Other | Admitting: Family Medicine

## 2012-07-15 ENCOUNTER — Encounter: Payer: Self-pay | Admitting: Family Medicine

## 2012-07-15 VITALS — BP 111/72 | HR 103 | Temp 98.0°F | Resp 16 | Ht 70.0 in | Wt 130.0 lb

## 2012-07-15 DIAGNOSIS — J189 Pneumonia, unspecified organism: Secondary | ICD-10-CM

## 2012-07-15 DIAGNOSIS — G4700 Insomnia, unspecified: Secondary | ICD-10-CM

## 2012-07-15 MED ORDER — ALBUTEROL SULFATE HFA 108 (90 BASE) MCG/ACT IN AERS: 2.0000 | INHALATION_SPRAY | Freq: Four times a day (QID) | RESPIRATORY_TRACT | Status: DC | PRN

## 2012-07-15 NOTE — Patient Instructions (Addendum)
Our goal is to taper the clonazepam by .25 mg at night every 2 weeks to every 4 weeks.   Results for orders placed in visit on 07/13/12  POCT CBC      Result Value Range   WBC 9.6  4.6 - 10.2 K/uL   Lymph, poc 2.6  0.6 - 3.4   POC LYMPH PERCENT 26.7  10 - 50 %L   MID (cbc) 0.7  0 - 0.9   POC MID % 6.9  0 - 12 %M   POC Granulocyte 6.4  2 - 6.9   Granulocyte percent 66.4  37 - 80 %G   RBC 4.79  4.69 - 6.13 M/uL   Hemoglobin 14.4  14.1 - 18.1 g/dL   HCT, POC 16.1  09.6 - 53.7 %   MCV 98.5 (*) 80 - 97 fL   MCH, POC 30.1  27 - 31.2 pg   MCHC 30.5 (*) 31.8 - 35.4 g/dL   RDW, POC 04.5     Platelet Count, POC 272  142 - 424 K/uL   MPV 9.4  0 - 99.8 fL

## 2012-07-15 NOTE — Telephone Encounter (Signed)
Patient returning nurse call regarding heart monitor, he can be reached at [phone].

## 2012-07-15 NOTE — Progress Notes (Signed)
52 yo with recent pneumonia, turned in his holter today at [employer].  His migraines started again today.  He is coughing up brown mucous and having night sweats.  No TB contacts (father died from lung ca-he was a smoker) The night sweats precede this pneumonia.  He is tapering his clonazepam.  He is tapering by .25 mg every 2-4 weeks.  Disabled since May 2011 because of migraines and gastroparesis.  Objective:  NAD, seen with wife Neck:  No adenopathy, supple Chest:  Clear to auscultation Heart: reg, no murmur Results for orders placed in visit on 07/13/12  POCT CBC      Result Value Range   WBC 9.6  4.6 - 10.2 K/uL   Lymph, poc 2.6  0.6 - 3.4   POC LYMPH PERCENT 26.7  10 - 50 %L   MID (cbc) 0.7  0 - 0.9   POC MID % 6.9  0 - 12 %M   POC Granulocyte 6.4  2 - 6.9   Granulocyte percent 66.4  37 - 80 %G   RBC 4.79  4.69 - 6.13 M/uL   Hemoglobin 14.4  14.1 - 18.1 g/dL   HCT, POC 16.1  09.6 - 53.7 %   MCV 98.5 (*) 80 - 97 fL   MCH, POC 30.1  27 - 31.2 pg   MCHC 30.5 (*) 31.8 - 35.4 g/dL   RDW, POC 04.5     Platelet Count, POC 272  142 - 424 K/uL   MPV 9.4  0 - 99.8 fL     Assessment:  Pneumonia with productive cough, night sweats, insomnia, and elevated pulse  Plan: improved breath sounds, tachycardia (mild) Continue prednisone Start albuterol inhaler.  Elvina Sidle, MD

## 2012-07-16 LAB — QUANTIFERON TB GOLD ASSAY (BLOOD)
Interferon Gamma Release Assay: NEGATIVE
Mitogen value: 0.83 IU/mL
Quantiferon Nil Value: 0.06 IU/mL
Quantiferon Tb Ag Minus Nil Value: 0 IU/mL
TB Ag value: 0.04 IU/mL

## 2012-07-21 NOTE — Telephone Encounter (Signed)
I spoke with pt today and reviewed monitor results with him.

## 2012-07-24 ENCOUNTER — Ambulatory Visit (INDEPENDENT_AMBULATORY_CARE_PROVIDER_SITE_OTHER): Payer: Medicare Other | Admitting: Family Medicine

## 2012-07-24 ENCOUNTER — Encounter: Payer: Self-pay | Admitting: Family Medicine

## 2012-07-24 VITALS — BP 117/79 | HR 97 | Temp 97.3°F | Resp 16 | Ht 70.5 in | Wt 129.0 lb

## 2012-07-24 DIAGNOSIS — G43909 Migraine, unspecified, not intractable, without status migrainosus: Secondary | ICD-10-CM

## 2012-07-24 DIAGNOSIS — J189 Pneumonia, unspecified organism: Secondary | ICD-10-CM

## 2012-07-24 DIAGNOSIS — F411 Generalized anxiety disorder: Secondary | ICD-10-CM

## 2012-07-24 DIAGNOSIS — L738 Other specified follicular disorders: Secondary | ICD-10-CM

## 2012-07-24 DIAGNOSIS — L853 Xerosis cutis: Secondary | ICD-10-CM

## 2012-07-24 MED ORDER — CLONAZEPAM 2 MG PO TABS: ORAL_TABLET | ORAL | Status: DC

## 2012-07-24 MED ORDER — CLONAZEPAM 0.25 MG PO TBDP: ORAL_TABLET | ORAL | Status: DC

## 2012-07-24 MED ORDER — TRIAMCINOLONE ACETONIDE 0.1 % EX CREA: TOPICAL_CREAM | Freq: Two times a day (BID) | CUTANEOUS | Status: DC

## 2012-07-24 NOTE — Progress Notes (Signed)
Several issues  Tapering the clonazepam but the 0.5 tablets have red dye which cause diarrhea.  He needs new Rx's  Pneumonia symptoms resolved including cough, night sweats, weakness  Foot problem: chronic dry feet for years,  Has dermatology appt  Headaches have come back since pneumonia symptoms resolved.  Objective:  NAD Chest clear Heart:  Regular, no murmur  Discussed the taper of the clonazepam and his intolerance to red dyes.  Feet:  Dry and scaly feet diffusely up to the ankles  Anxiety state, unspecified - Plan: clonazePAM (KLONOPIN) 0.25 MG disintegrating tablet, clonazePAM (KLONOPIN) 2 MG tablet  Xerosis of skin - Plan: triamcinolone cream (KENALOG) 0.1 %  Resolved pneumonia.  Recommend pneumonia shot this summer.  Headache appt with Dr. Catalina Lunger.  April 7th

## 2012-07-31 ENCOUNTER — Ambulatory Visit (INDEPENDENT_AMBULATORY_CARE_PROVIDER_SITE_OTHER): Payer: Medicare Other | Admitting: Cardiovascular Disease

## 2012-07-31 ENCOUNTER — Encounter: Payer: Self-pay | Admitting: Cardiovascular Disease

## 2012-07-31 VITALS — BP 110/80 | HR 92 | Wt 133.0 lb

## 2012-07-31 DIAGNOSIS — I491 Atrial premature depolarization: Secondary | ICD-10-CM

## 2012-07-31 NOTE — Patient Instructions (Signed)
Your physician wants you to follow-up in:  12 months.  You will receive a reminder letter in the mail two months in advance. If you don't receive a letter, please call our office to schedule the follow-up appointment.   

## 2012-07-31 NOTE — Progress Notes (Signed)
History of Present Illness: 52 yo AAM with history of palpitations, migraine headaches and irritable bowel syndrome with previous diagnosis of mitral valve prolapse in 1989 who is here today for cardiac follow up. I last saw him March 2014. He has been known to have PVCs in the past. He did not tolerate beta blockers in the past. I started him on Cardizem CD 120 mg per day in 2011. He had a normal echo and stress test in 2011. Holter monitor in 2011 with no evidence of SVT, atrial fibrillation. He has not been on his Cardizem over the last two years since his palpitations had resolved. On 06/28/12 he felt sick with sore throat and congestion. On 07/01/12, seen in Urgent Care by Dr. Merla Riches and started on Z-pack. On 07/03/12 he began to have a productive cough and chest pain and was seen in the [facility]. CTA chest negative for PE. He was seen later that day by Dr. Cleta Alberts in the [facility] and given c/o chest pain at that time, was sent to [facility] ED. EKG with sinus tach, rate 107 bpm. Troponin was negative in the ED. He described sweating, coughing, fever, chills and pleuritic chest pain with cough. He has noticed his heart racing. I arranged a 48 hour monitor and an echo. Echo with normal LV size and function. 48 hour monitor with normal sinus rhythm and rare PACs.   He is here today for cardiac follow up. He is feeling well. No chest pain or SOB. Pneumonia resolved.   Primary Care Physician: [place] [facility], Dr. Perrin Maltese  Last Lipid Profile:Lipid Panel     Component Value Date/Time   CHOL 200 04/22/2012 0913   TRIG 97 04/22/2012 0913   HDL 51 04/22/2012 0913   CHOLHDL 3.9 04/22/2012 0913   VLDL 19 04/22/2012 0913   LDLCALC 130* 04/22/2012 0913     Past Medical History  Diagnosis Date  . Allergy   . Depression   . Migraine headache   . Anxiety   . Mitral valve prolapse   . Heart murmur     No past surgical history on file.  Current Outpatient Prescriptions    Medication Sig Dispense Refill  . albuterol (PROVENTIL HFA;VENTOLIN HFA) 108 (90 BASE) MCG/ACT inhaler Inhale 2 puffs into the lungs every 6 (six) hours as needed for wheezing.  1 Inhaler  0  . clonazePAM (KLONOPIN) 0.25 MG disintegrating tablet Continue to taper overall dose by .25 mg daily every 2-4 weeks  120 tablet  0  . clonazePAM (KLONOPIN) 2 MG tablet Cut in half and supplement with the 0.25 mg tablet to continue taper.      . cyclobenzaprine (FLEXERIL) 10 MG tablet Take one half to one tablet 3 times a day as a muscle relaxant  30 tablet  0  . guaiFENesin (MUCINEX) 600 MG 12 hr tablet Take 600 mg by mouth 2 (two) times daily.      [name] levofloxacin (LEVAQUIN) 750 MG tablet Take 1 tablet (750 mg total) by mouth daily. X 7 days  7 tablet  0  . lidocaine (LIDODERM) 5 % Place 1 patch onto the skin daily as needed. Remove & Discard patch within 12 hours or as directed by MD as needed for pain      . methylPREDNISolone (MEDROL) 4 MG tablet 2 daily for 5 days, then 1 daily for a week.  30 tablet  0  . metoCLOPramide (REGLAN) 5 MG tablet Take 5 mg by  mouth 2 (two) times daily as needed. For nausea/vomiting and stomach spasms      . mirtazapine (REMERON) 45 MG tablet Take 1 tablet (45 mg total) by mouth at bedtime. Take one tablet at bedtime  30 tablet  11  . oxyCODONE-acetaminophen (TYLOX) 5-500 MG per capsule Take 1 capsule by mouth every 4 (four) hours as needed. For pain      . promethazine (PHENERGAN) 25 MG tablet Take 25 mg by mouth every 6 (six) hours as needed. For nausea/vomiting      . tamsulosin (FLOMAX) 0.4 MG CAPS Take 1 capsule (0.4 mg total) by mouth daily.  30 capsule  5  . triamcinolone cream (KENALOG) 0.1 % Apply topically 2 (two) times daily.  454 g  0   No current facility-administered medications for this visit.    Allergies  Allergen Reactions  . Cephalexin Diarrhea and Nausea And Vomiting  . Cephalosporins Diarrhea and Nausea And Vomiting  . Erythromycin Nausea And  Vomiting  . Ketorolac Tromethamine     REACTION: Reaction not known  . Latex     REACTION: Reaction not known  . Nalbuphine     REACTION: Reaction not known  . Prednisone Nausea And Vomiting  . Prochlorperazine Edisylate     REACTION: shakes/tremors  . Topamax (Topiramate) Diarrhea and Nausea And Vomiting  . Cefdinir Diarrhea, Nausea And Vomiting and Rash    History   Social History  . Marital Status: Married    Spouse Name: N/A    Number of Children: N/A  . Years of Education: N/A   Occupational History  . Not on file.   Social History Main Topics  . Smoking status: Never Smoker   . Smokeless tobacco: Not on file  . Alcohol Use: No  . Drug Use: No  . Sexually Active: Yes   Other Topics Concern  . Not on file   Social History Narrative  . No narrative on file    Family History  Problem Relation Age of Onset  . Hypertension Mother   . Hypertension Brother     Review of Systems:  As stated in the HPI and otherwise negative.   BP 110/80  Pulse 92  Wt 133 lb (60.328 kg)  BMI 18.81 kg/m2  SpO2 97%  Physical Examination: General: Well developed, well nourished, NAD HEENT: OP clear, mucus membranes moist SKIN: warm, dry. No rashes. Neuro: No focal deficits Musculoskeletal: Muscle strength 5/5 all ext Psychiatric: Mood and affect normal Neck: No JVD, no carotid bruits, no thyromegaly, no lymphadenopathy. Lungs:Clear bilaterally, no wheezes, rhonci, crackles Cardiovascular: Regular rate and rhythm. No murmurs, gallops or rubs. Abdomen:Soft. Bowel sounds present. Non-tender.  Extremities: No lower extremity edema. Pulses are 2 + in the bilateral DP/PT.  Echo 07/10/12: Left ventricle: The cavity size was normal. Wall thickness was normal. Systolic function was normal. The estimated ejection fraction was in the range of 60% to 65%. Wall motion was normal; there were no regional wall motion abnormalities. Left ventricular diastolic function parameters were  normal.   Assessment and Plan:   1. Chest pain: Atypical. No recurrence.  2. Palpitations: PACs on monitor. Likely worsened in setting of acute URI. Resolved. He can resume exercise. Avoid stimulants and OTC meds with stimulant qualities.

## 2012-08-13 ENCOUNTER — Telehealth: Payer: Self-pay

## 2012-08-13 MED ORDER — OXYCODONE-ACETAMINOPHEN 5-500 MG PO CAPS: 1.0000 | ORAL_CAPSULE | ORAL | Status: DC | PRN

## 2012-08-13 NOTE — Telephone Encounter (Signed)
Patient advised it is ready for pick up

## 2012-08-13 NOTE — Telephone Encounter (Signed)
Rx printed.  It's OXYcodone, so he has to pick it up.  Meds ordered this encounter  Medications  . oxyCODONE-acetaminophen (TYLOX) 5-500 MG per capsule    Sig: Take 1 capsule by mouth every 4 (four) hours as needed. For pain    Dispense:  30 capsule    Refill:  0    Order Specific Question:  Supervising Provider    Answer:  DOOLITTLE, ROBERT P [[id number]]

## 2012-08-13 NOTE — Telephone Encounter (Signed)
Please advise if we can do this,

## 2012-08-13 NOTE — Telephone Encounter (Signed)
Dr. Elbert Ewings pt. He had an appt for tomorrow but I have rescheduled this to 4/24 to ease Dr. Cain Saupe clinic due to illness. He needs a refill of Tylox and phenergan (sp?) to get him thru til next Thursday.   Pt and I were not sure if he had to pickup the rx or it if could be called in, please let him know @ [phone]

## 2012-08-14 ENCOUNTER — Other Ambulatory Visit: Payer: Self-pay | Admitting: Radiology

## 2012-08-14 ENCOUNTER — Ambulatory Visit: Payer: Medicare Other | Admitting: Family Medicine

## 2012-08-14 MED ORDER — OXYCODONE-ACETAMINOPHEN 5-325 MG PO TABS: 1.0000 | ORAL_TABLET | Freq: Three times a day (TID) | ORAL | Status: DC | PRN

## 2012-08-14 NOTE — Telephone Encounter (Signed)
Tylox 5/500 discontinued by manufacturer, have destroyed rx written yesterday and patient given Rx for Oxycodone 5/325 written by Rhoderick Moody.

## 2012-08-21 ENCOUNTER — Ambulatory Visit: Payer: Medicare Other | Admitting: Family Medicine

## 2012-08-28 ENCOUNTER — Ambulatory Visit: Payer: Medicare Other | Admitting: Family Medicine

## 2012-09-04 ENCOUNTER — Other Ambulatory Visit: Payer: Self-pay | Admitting: Dermatology

## 2012-09-11 ENCOUNTER — Ambulatory Visit (INDEPENDENT_AMBULATORY_CARE_PROVIDER_SITE_OTHER): Payer: Medicare Other | Admitting: Family Medicine

## 2012-09-11 ENCOUNTER — Encounter: Payer: Self-pay | Admitting: Family Medicine

## 2012-09-11 VITALS — BP 108/52 | HR 136 | Temp 97.9°F | Resp 16 | Ht 70.0 in | Wt 127.2 lb

## 2012-09-11 DIAGNOSIS — R51 Headache: Secondary | ICD-10-CM

## 2012-09-11 DIAGNOSIS — G8929 Other chronic pain: Secondary | ICD-10-CM

## 2012-09-11 MED ORDER — PROMETHAZINE HCL 25 MG PO TABS: 25.0000 mg | ORAL_TABLET | Freq: Three times a day (TID) | ORAL | Status: DC | PRN

## 2012-09-11 MED ORDER — OXYCODONE-ACETAMINOPHEN 5-325 MG PO TABS: 1.0000 | ORAL_TABLET | Freq: Three times a day (TID) | ORAL | Status: DC | PRN

## 2012-09-11 NOTE — Progress Notes (Signed)
52 yo with chronic headaches.  He's had very few days which have been pain-free.  He has been reducing his clonazepam which seems to be related to the increased intensity and frequency of the headaches.  He does feel clearer in terms of thinking, learning.  He also has the gastroparesis.  She has seen Dr. Catalina Lunger in [place].  She was advocating Botox, which  Patient does not want.  She also suggested a TENS unit but patient shies away from this because of the cost.  Patient stays nauseated.  He hasn't been active lately (wife in car accident 2 weeks ago)  Objective:  NAD Seems down in the tooth Chest:  Clear Heart:  Rapid at 100 without murmur or gallop Neuro:  Alert, CN III-XII, motor intact (normal upper extrem movement and gait)  Assessment:  Chronic headaches, refractory to traditional medicines.  I have encouraged patient to follow up with Dr. Colbert Coyer.  I think patient have tapered the clonazepam too quickly.  Plan:  Clonazepam 1.25 mg at hs. keep this dose steady for the next month Trial of metoxolone 800 at hs., Will followup in 2 weeks for Cialis is working.  Also agreed to increase the phenergan to 50 mg at a time, no more than 6 in any 24 hour period. I strongly recommend patient see Dr. Colbert Coyer in [place] for followup but he insisted on getting another consult with Grace Isaac, M.D.-neurologist in [facility]. I agreed to give him 30 Tylox tablets to be used 1 in any 24-hour period but no more than 2-3 and a week. I also encouraged patient to get more exercise. I'll followup in 2 weeks.  Chronic headaches - Plan: promethazine (PHENERGAN) 25 MG tablet, oxyCODONE-acetaminophen (ROXICET) 5-325 MG per tablet, Ambulatory referral to Neurology   Signed,  Elvina Sidle, MD Elvina Sidle, MD

## 2012-09-25 ENCOUNTER — Ambulatory Visit (INDEPENDENT_AMBULATORY_CARE_PROVIDER_SITE_OTHER): Payer: Medicare Other | Admitting: Family Medicine

## 2012-09-25 ENCOUNTER — Encounter: Payer: Self-pay | Admitting: Family Medicine

## 2012-09-25 VITALS — BP 110/74 | HR 100 | Temp 97.7°F | Resp 16 | Ht 70.0 in | Wt 127.2 lb

## 2012-09-25 DIAGNOSIS — M21612 Bunion of left foot: Secondary | ICD-10-CM

## 2012-09-25 DIAGNOSIS — Z9109 Other allergy status, other than to drugs and biological substances: Secondary | ICD-10-CM

## 2012-09-25 DIAGNOSIS — J301 Allergic rhinitis due to pollen: Secondary | ICD-10-CM

## 2012-09-25 DIAGNOSIS — M21619 Bunion of unspecified foot: Secondary | ICD-10-CM

## 2012-09-25 DIAGNOSIS — G8929 Other chronic pain: Secondary | ICD-10-CM

## 2012-09-25 DIAGNOSIS — M21611 Bunion of right foot: Secondary | ICD-10-CM

## 2012-09-25 DIAGNOSIS — G43009 Migraine without aura, not intractable, without status migrainosus: Secondary | ICD-10-CM

## 2012-09-25 MED ORDER — PROMETHAZINE HCL 25 MG PO TABS: 25.0000 mg | ORAL_TABLET | Freq: Three times a day (TID) | ORAL | Status: DC | PRN

## 2012-09-25 MED ORDER — CLONAZEPAM 1 MG PO TABS: ORAL_TABLET | ORAL | Status: DC

## 2012-09-25 MED ORDER — MONTELUKAST SODIUM 10 MG PO TABS: 10.0000 mg | ORAL_TABLET | Freq: Every day | ORAL | Status: DC

## 2012-09-25 NOTE — Progress Notes (Signed)
52 yo man with chronic headaches.  Doing better lately, with flexeril at onset working the best. Has started to exercise which greatly reduced the headaches  1. Exercise program questions 2. Allergies 3. Foot surgery anticipated, moderate to mild bunions with r>l and painful.  Objective:  NAD Bilateral bunions, R more prominent than L Chest clear Heart:  Reg, no murmur Affect:  Really seems more upbeat  Spent 30 minutes or so discussing exercises and suggested the [employer] as a resource.  Bilateral bunions - Plan: Ambulatory referral to Podiatry  Chronic headaches - Plan: clonazePAM (KLONOPIN) 1 MG tablet, promethazine (PHENERGAN) 25 MG tablet  Environmental allergies - Plan: montelukast (SINGULAIR) 10 MG tablet  Kenley Troop

## 2012-10-11 ENCOUNTER — Ambulatory Visit (INDEPENDENT_AMBULATORY_CARE_PROVIDER_SITE_OTHER): Payer: Medicare Other | Admitting: Family Medicine

## 2012-10-11 VITALS — BP 117/75 | HR 118 | Temp 98.0°F | Resp 16 | Ht 70.5 in | Wt 125.2 lb

## 2012-10-11 DIAGNOSIS — G8929 Other chronic pain: Secondary | ICD-10-CM

## 2012-10-11 DIAGNOSIS — R51 Headache: Secondary | ICD-10-CM

## 2012-10-11 DIAGNOSIS — G43909 Migraine, unspecified, not intractable, without status migrainosus: Secondary | ICD-10-CM

## 2012-10-11 DIAGNOSIS — L989 Disorder of the skin and subcutaneous tissue, unspecified: Secondary | ICD-10-CM

## 2012-10-11 MED ORDER — CYCLOBENZAPRINE HCL 5 MG PO TABS: 5.0000 mg | ORAL_TABLET | Freq: Three times a day (TID) | ORAL | Status: DC | PRN

## 2012-10-11 MED ORDER — OXYCODONE-ACETAMINOPHEN 5-325 MG PO TABS: 1.0000 | ORAL_TABLET | Freq: Three times a day (TID) | ORAL | Status: DC | PRN

## 2012-10-11 MED ORDER — PROMETHAZINE HCL 25 MG PO TABS: 25.0000 mg | ORAL_TABLET | Freq: Three times a day (TID) | ORAL | Status: DC | PRN

## 2012-10-11 NOTE — Progress Notes (Signed)
52 yo with chronic headaches.  He is taking clonazepam 1 mg daily.  He  Is having bunion evaluation.  Since Wednesday, his headaches have been worse.  He finds that exercise relieves the headaches.  He wanted me to look at his left knee skin biopsy.  Married, lives in apartment and is working on emptying his garage of books.  Objective:  NAD Left knee biopsy site:  Healing well We discussed the exercise, the migraine. He was given O2  Patient has innumerable questions about his health which will patiently answered over a 40 five-minute period with his wife  Assessment:  Chronic migraine, responsive to exercise.  Plan:  Neuro appt June 30th Nutrition consult.  Milus Glazier, MD

## 2012-10-23 ENCOUNTER — Encounter: Payer: Self-pay | Admitting: Family Medicine

## 2012-10-23 ENCOUNTER — Ambulatory Visit (INDEPENDENT_AMBULATORY_CARE_PROVIDER_SITE_OTHER): Payer: Medicare Other | Admitting: Family Medicine

## 2012-10-23 VITALS — BP 110/67 | HR 95 | Temp 98.4°F | Resp 16 | Ht 70.0 in | Wt 124.8 lb

## 2012-10-23 DIAGNOSIS — R110 Nausea: Secondary | ICD-10-CM

## 2012-10-23 DIAGNOSIS — R197 Diarrhea, unspecified: Secondary | ICD-10-CM

## 2012-10-23 DIAGNOSIS — G8929 Other chronic pain: Secondary | ICD-10-CM

## 2012-10-23 DIAGNOSIS — G43009 Migraine without aura, not intractable, without status migrainosus: Secondary | ICD-10-CM

## 2012-10-23 DIAGNOSIS — R634 Abnormal weight loss: Secondary | ICD-10-CM

## 2012-10-23 DIAGNOSIS — G43909 Migraine, unspecified, not intractable, without status migrainosus: Secondary | ICD-10-CM

## 2012-10-23 MED ORDER — PROMETHAZINE HCL 25 MG PO TABS: 25.0000 mg | ORAL_TABLET | Freq: Three times a day (TID) | ORAL | Status: DC | PRN

## 2012-10-23 MED ORDER — ACETAMINOPHEN-CODEINE #3 300-30 MG PO TABS: 1.0000 | ORAL_TABLET | ORAL | Status: DC | PRN

## 2012-10-23 MED ORDER — METHYLPREDNISOLONE ACETATE 80 MG/ML IJ SUSP: 80.0000 mg | Freq: Once | INTRAMUSCULAR | Status: DC

## 2012-10-23 MED ORDER — PROMETHAZINE HCL 25 MG/ML IJ SOLN
50.0000 mg | Freq: Four times a day (QID) | INTRAMUSCULAR | Status: DC | PRN
  Administered 2012-10-23: 50 mg via INTRAMUSCULAR

## 2012-10-23 MED ORDER — METHYLPREDNISOLONE ACETATE 80 MG/ML IJ SUSP
80.0000 mg | Freq: Once | INTRAMUSCULAR | Status: AC
  Administered 2012-10-23: 80 mg via INTRAMUSCULAR

## 2012-10-23 MED ORDER — PROMETHAZINE HCL 12.5 MG PO TABS: 25.0000 mg | ORAL_TABLET | Freq: Four times a day (QID) | ORAL | Status: DC | PRN

## 2012-10-23 MED ORDER — PROMETHAZINE HCL 25 MG/ML IJ SOLN: 50.0000 mg | Freq: Four times a day (QID) | INTRAMUSCULAR | Status: DC | PRN

## 2012-10-23 NOTE — Progress Notes (Signed)
52 yo migraine patient whose headaches have worsened.  He has not changed his medication since last visit.  Mother is in hospital with bladder prolapse in [place]. He has associated nausea and weight loss.  He has lost 26 lbs over past year.  He has intermittent diarrhea.  Objective:  NAD Alert, conversant.  Seen with wife who is very concerned about his weight. Chest:  Clear to auscultation Neck:  Supple Heart:  Regular, no murmur Skin:  Warm and dry Abdomen:  Soft, nontender, no HSM Patient spent 90 minutes in office for observation after injection  Assessment:  Chronic migraine, nausea, IBS, weight loss.  Plan:  Neuro evaluation in 5 days Chronic headaches - Plan: promethazine (PHENERGAN) 25 MG tablet, promethazine (PHENERGAN) injection 50 mg, promethazine (PHENERGAN) injection 50 mg, DISCONTINUED: methylPREDNISolone acetate (DEPO-MEDROL) injection 80 mg, DISCONTINUED: methylPREDNISolone acetate (DEPO-MEDROL) injection 80 mg  Migraine - Plan: promethazine (PHENERGAN) 25 MG tablet, promethazine (PHENERGAN) injection 50 mg, promethazine (PHENERGAN) injection 50 mg, DISCONTINUED: methylPREDNISolone acetate (DEPO-MEDROL) injection 80 mg, DISCONTINUED: methylPREDNISolone acetate (DEPO-MEDROL) injection 80 mg  Loss of weight - Plan: methylPREDNISolone acetate (DEPO-MEDROL) injection 80 mg, promethazine (PHENERGAN) 25 MG tablet, promethazine (PHENERGAN) tablet 25 mg, Ambulatory referral to Gastroenterology, promethazine (PHENERGAN) injection 50 mg  Nausea alone - Plan: promethazine (PHENERGAN) injection 50 mg  Signed, Elvina Sidle, MD

## 2012-10-25 ENCOUNTER — Ambulatory Visit (INDEPENDENT_AMBULATORY_CARE_PROVIDER_SITE_OTHER): Payer: Medicare Other | Admitting: Family Medicine

## 2012-10-25 ENCOUNTER — Encounter: Payer: Self-pay | Admitting: Family Medicine

## 2012-10-25 VITALS — BP 107/71 | HR 92 | Temp 98.0°F | Resp 16 | Ht 71.5 in | Wt 126.2 lb

## 2012-10-25 DIAGNOSIS — R634 Abnormal weight loss: Secondary | ICD-10-CM

## 2012-10-25 DIAGNOSIS — S0093XA Contusion of unspecified part of head, initial encounter: Secondary | ICD-10-CM

## 2012-10-25 DIAGNOSIS — S0003XA Contusion of scalp, initial encounter: Secondary | ICD-10-CM

## 2012-10-25 DIAGNOSIS — R51 Headache: Secondary | ICD-10-CM

## 2012-10-25 DIAGNOSIS — S0083XA Contusion of other part of head, initial encounter: Secondary | ICD-10-CM

## 2012-10-25 NOTE — Patient Instructions (Addendum)
No narcotics or phenergan unless you check with me first  Keep appointment with Dr. Ala Dach

## 2012-10-25 NOTE — Progress Notes (Signed)
52 yo patient with migraines who 1/2 hour prior to arrival hit right forehead on pillar in bathroom when standing up quickly.  No LOC.  He was seen two days ago and he was given phenergan IM and has been groggy since.  Appetite was starting to come back.   Today was best meal in weeks.  He is taking ensure milkshakes.  He took a tylox yesterday afternoon for the headache and took a phenergan with it.  Wife works in school system and is home until August.  His headache has been better since Saturday.    Objective:  Alert with smile on face, poor eye contact Gait is stable Head:  No ecchymosis or swelling Eyes:  Normal EOM, PERLA, fundi Ears:  Normal otoscopy and general inspection Oroph: atraumatic Neck:  Supple, no adenop or tenderness Chest:  Clear Heart:  Reg, no murmur Extrem:  Atraumatic Skin: atraumatic BP 100/60 sitting and standing with no pulse change  Assessment:  Overmedicated.  No further narcotics or phenergan.  Head contusion  Plan:  See Dr. Ala Dach on Monday. No further narcotics or phenergan.  Signed, Elvina Sidle, MD

## 2012-10-27 ENCOUNTER — Ambulatory Visit: Payer: Medicare Other | Admitting: Neurology

## 2012-11-28 DIAGNOSIS — Z0271 Encounter for disability determination: Secondary | ICD-10-CM

## 2012-12-01 ENCOUNTER — Encounter: Payer: Self-pay | Admitting: *Deleted

## 2012-12-01 ENCOUNTER — Encounter: Payer: Self-pay | Attending: Family Medicine | Admitting: *Deleted

## 2012-12-01 VITALS — Ht 70.0 in | Wt 124.9 lb

## 2012-12-01 DIAGNOSIS — Z713 Dietary counseling and surveillance: Secondary | ICD-10-CM | POA: Insufficient documentation

## 2012-12-01 DIAGNOSIS — E785 Hyperlipidemia, unspecified: Secondary | ICD-10-CM | POA: Insufficient documentation

## 2012-12-01 NOTE — Progress Notes (Signed)
Medical Nutrition Therapy:  Appt start time: 1030 end time:  1130.  Assessment:  Primary concern today: Unintentional weight loss. Patient with a long history of migraine headaches since the 1980s. He was also recently diagnosed with gastroparesis. He reports that he has lost about 26 pounds in the last year due to decreased appetite and nausea. He also has several intolerances to foods, including lactose, gluten, MSG, and certain fruits. He has been drinking Ensure shakes (220 calories) once daily, but wants help with gaining weight gain and lean body mass.   MEDICATIONS: Lidocaine patches, mirtazapine, clonazepam, cyclobenzaprine   DIETARY INTAKE:   Usual eating pattern includes 2-3 meals and 2-3 snacks per day.  Patient's wife works in schools, and prepares his food, aiming for 3 meals daily. However, if she does not cook for him, he does not eat as much on his own.   Usual physical activity: None, Patient has a [employer]  Estimated energy needs: 2000 calories 250 g carbohydrates 125 g protein 56 g fat  Progress Towards Goal(s):  In progress.   Nutritional Diagnosis:  Kennedy-3.2 Unintentional weight loss As related to migraine headaches.  As evidenced by 26 pound weight loss in 1 year.    Intervention:  Nutrition counseling. Patient and his wife educated on a high calorie, high protein diet. We discussed good sources of protein, ways to add calories to meals, nutrition supplements, and physical activity.   Goals:  1. 0.5 pound weight gain per week.  2. Include a good source of protein with all meals and snacks.  3. Add fat to starches/vegetables (oil, butter, salad dressing).  4. Eat at least 6 times daily.  5. Drink Carnation Instant Breakfast 1-3 times daily as needed.  6. Incorporate resistance exercises (at gym) 3 days weekly.  Work up to treadmill/bike 20-30 minutes at [employer] 2 days a week.    Handouts given during visit include:  High protein, high calorie nutrition  therapy  Monitoring/Evaluation:  Dietary intake, exercise, and body weight prn.

## 2012-12-04 ENCOUNTER — Telehealth: Payer: Self-pay

## 2012-12-04 NOTE — Telephone Encounter (Signed)
Dr. Elbert Ewings    Requesting referral to [facility] for migraines   [phone]

## 2012-12-04 NOTE — Telephone Encounter (Signed)
Referral made 

## 2012-12-08 ENCOUNTER — Telehealth: Payer: Self-pay

## 2012-12-08 NOTE — Telephone Encounter (Signed)
Patient checking referral status. Please advise. Told pt that we are currently waiting on them for a response.

## 2012-12-11 ENCOUNTER — Ambulatory Visit: Payer: Medicare Other

## 2012-12-11 ENCOUNTER — Encounter: Payer: Self-pay | Admitting: Family Medicine

## 2012-12-11 ENCOUNTER — Ambulatory Visit (INDEPENDENT_AMBULATORY_CARE_PROVIDER_SITE_OTHER): Payer: Medicare Other | Admitting: Family Medicine

## 2012-12-11 VITALS — BP 111/75 | HR 99 | Temp 97.8°F | Resp 16 | Ht 71.0 in | Wt 123.0 lb

## 2012-12-11 DIAGNOSIS — G8929 Other chronic pain: Secondary | ICD-10-CM

## 2012-12-11 DIAGNOSIS — M542 Cervicalgia: Secondary | ICD-10-CM

## 2012-12-11 DIAGNOSIS — G43909 Migraine, unspecified, not intractable, without status migrainosus: Secondary | ICD-10-CM

## 2012-12-11 DIAGNOSIS — R51 Headache: Secondary | ICD-10-CM

## 2012-12-11 MED ORDER — MIRTAZAPINE 30 MG PO TABS: 30.0000 mg | ORAL_TABLET | Freq: Every day | ORAL | Status: DC

## 2012-12-11 MED ORDER — CYCLOBENZAPRINE HCL 5 MG PO TABS: 5.0000 mg | ORAL_TABLET | Freq: Three times a day (TID) | ORAL | Status: DC | PRN

## 2012-12-11 MED ORDER — CLONAZEPAM 1 MG PO TABS: ORAL_TABLET | ORAL | Status: DC

## 2012-12-11 NOTE — Progress Notes (Signed)
52 yo man with chronic headaches comes in with his wife to consider his therapeutic alternatives.  He quit narcotics completely June 19 and though the headaches continue, his mental clarity is much better.  He has reduced his clonazepam now to 1 mg daily.   He has recently developed some soreness in his left shoulder with tightness.  He went to a chiropractor who would charge him $200 per month to help with the neck and headaches, but this expense is out of pocket and too much.  He and wife would like to consider accupuncture or something else.  Objective:  NAD Spent 30 minutes face to face Some tightness left trapezius, but good ROM of shoulder and neck overall Normal upper extremity reflexes:  BJ and TJ  [facility] reading (PRIMARY) by  Dr. Milus Glazier:  No significant abn  Assessment:  Remarkable improvement in headache management. Patient is much brighter off of his narcotics. I'm pleased is also tapering the clonazepam.  Plan: Physical therapy for the neck and back, continue to wean off lorazepam, avoid narcotics  Signed, Elvina Sidle

## 2013-02-19 ENCOUNTER — Ambulatory Visit (INDEPENDENT_AMBULATORY_CARE_PROVIDER_SITE_OTHER): Payer: Medicare Other | Admitting: Family Medicine

## 2013-02-19 ENCOUNTER — Encounter: Payer: Self-pay | Admitting: Family Medicine

## 2013-02-19 VITALS — BP 115/62 | HR 91 | Temp 97.5°F | Resp 16 | Ht 71.0 in | Wt 129.0 lb

## 2013-02-19 DIAGNOSIS — K3184 Gastroparesis: Secondary | ICD-10-CM

## 2013-02-19 DIAGNOSIS — Z23 Encounter for immunization: Secondary | ICD-10-CM

## 2013-02-19 DIAGNOSIS — G43909 Migraine, unspecified, not intractable, without status migrainosus: Secondary | ICD-10-CM

## 2013-02-19 DIAGNOSIS — G8929 Other chronic pain: Secondary | ICD-10-CM

## 2013-02-19 DIAGNOSIS — R51 Headache: Secondary | ICD-10-CM

## 2013-02-19 MED ORDER — MIRTAZAPINE 15 MG PO TABS: 15.0000 mg | ORAL_TABLET | Freq: Every day | ORAL | Status: DC

## 2013-02-19 MED ORDER — CLONAZEPAM 1 MG PO TABS: ORAL_TABLET | ORAL | Status: DC

## 2013-02-19 NOTE — Progress Notes (Signed)
52 yo unemployed married man.  He is on permanent disability because of migraines and gastroparesis.  Dr. Clyda Hurdle is providing very effective physical therapy, using manipulation and tape application to back of neck  Dr. Ricki Miller is doing acupuncture which is lessening the gastroparetic symptoms.  Objective:  NAD Neck rom:  Decreased rotation to 30 degrees Skin:  Clear  Assessment: migraines, gastroparesis are significantly improved.  I am very impressed with the patient's progress. The care he's been given by his 2 consultants has been truly remarkable.  Plan: Followup in 2 months this will To try weaning off of medicine with decrease in Remeron over the next 6 weeks followed by gradual taper of clonazepam. Migraine - Plan: mirtazapine (REMERON) 15 MG tablet  Chronic headaches - Plan: clonazePAM (KLONOPIN) 1 MG tablet  Need for prophylactic vaccination and inoculation against influenza - Plan: Flu Vaccine QUAD 36+ mos IM  Gastroparesis   Signed, William Sidle, MD

## 2013-04-02 ENCOUNTER — Ambulatory Visit (INDEPENDENT_AMBULATORY_CARE_PROVIDER_SITE_OTHER): Payer: Medicare Other | Admitting: Family Medicine

## 2013-04-02 ENCOUNTER — Encounter: Payer: Self-pay | Admitting: Family Medicine

## 2013-04-02 VITALS — BP 118/70 | HR 93 | Temp 98.0°F | Resp 16 | Ht 70.0 in | Wt 129.8 lb

## 2013-04-02 DIAGNOSIS — R51 Headache: Secondary | ICD-10-CM

## 2013-04-02 DIAGNOSIS — G8929 Other chronic pain: Secondary | ICD-10-CM

## 2013-04-02 DIAGNOSIS — G43909 Migraine, unspecified, not intractable, without status migrainosus: Secondary | ICD-10-CM

## 2013-04-02 DIAGNOSIS — M542 Cervicalgia: Secondary | ICD-10-CM

## 2013-04-02 MED ORDER — MIRTAZAPINE 30 MG PO TABS: 30.0000 mg | ORAL_TABLET | Freq: Every day | ORAL | Status: DC

## 2013-04-02 MED ORDER — CLONAZEPAM 1 MG PO TABS: ORAL_TABLET | ORAL | Status: DC

## 2013-04-02 NOTE — Progress Notes (Signed)
This 52 year old gentleman with a long history of headaches. He is in remarkably well over the last 6 months with acupuncture and physical therapy. Over the last 3 weeks he's reached a Hurdle that is having trouble getting past. He's noticing more pain in his shoulders which proceeded to a left-sided headache behind his left eye  He was like to increase his Remeron which I think is in his best interest. He also is continuing his clonazepam but only at 1 mg daily.  We discussed going ahead with some physical therapy and manipulation which I think is appropriate. He'll be continuing his acupuncture at least once a week and he will undertake a yoga program and continue his daily exercises.  Objective: No acute distress HEENT: Unremarkable Neuro: Alert and appropriate, no cranial nerves, normal gait, normal range of motion of all 4 extremities  Time with patient face-to-face for 30 minutes  Assessment: Overall patient has made remarkable progress in the last 6 months. It's important that we continue this improvement trend and address the recent difficulties.  Chronic headaches - Plan: clonazePAM (KLONOPIN) 1 MG tablet, mirtazapine (REMERON) 30 MG tablet  Neck pain on left side - Plan: mirtazapine (REMERON) 30 MG tablet  Migraine - Plan: mirtazapine (REMERON) 30 MG tablet  Signed, Elvina Sidle, MD

## 2013-04-19 ENCOUNTER — Ambulatory Visit (INDEPENDENT_AMBULATORY_CARE_PROVIDER_SITE_OTHER): Payer: Medicare Other | Admitting: Emergency Medicine

## 2013-04-19 ENCOUNTER — Ambulatory Visit: Payer: Medicare Other

## 2013-04-19 VITALS — BP 112/78 | HR 94 | Temp 98.1°F | Resp 18 | Ht 70.5 in | Wt 130.0 lb

## 2013-04-19 DIAGNOSIS — M25552 Pain in left hip: Secondary | ICD-10-CM

## 2013-04-19 DIAGNOSIS — M25559 Pain in unspecified hip: Secondary | ICD-10-CM

## 2013-04-19 DIAGNOSIS — G43909 Migraine, unspecified, not intractable, without status migrainosus: Secondary | ICD-10-CM

## 2013-04-19 MED ORDER — CYCLOBENZAPRINE HCL 5 MG PO TABS: 5.0000 mg | ORAL_TABLET | Freq: Three times a day (TID) | ORAL | Status: DC | PRN

## 2013-04-19 NOTE — Patient Instructions (Addendum)

## 2013-04-19 NOTE — Progress Notes (Addendum)
   Subjective:    Patient ID: William [name], male    DOB: 1960-10-31, 52 y.o.   MRN: [id number] This chart was scribed for Lesle Chris, MD by Nicholos Johns, Medical Scribe. This patient's care was started at 11:05 AM.  HPI  HPI Comments: William [name] is a 52 y.o. male w/hx of lower back pain presents to the [facility] complaining of sudden onset, radiating pain in his left hip, onset 3 days ago. Pt has been using over the counter medication for left hip pain with no relief. Pt has a hx of pain medication addiction, but quit using in July 2014.   Review of Systems  Musculoskeletal: Positive for arthralgias (Left hip).      Objective:   Physical Exam  Vitals reviewed.  CONSTITUTIONAL: Well developed/well nourished HEAD: Normocephalic/atraumatic EYES: EOMI/PERRL ENMT: Mucous membranes moist NECK: supple no meningeal signs SPINE:entire spine nontender CV: S1/S2 noted, no murmurs/rubs/gallops noted LUNGS: Lungs are clear to auscultation bilaterally, no apparent distress ABDOMEN: soft, nontender, no rebound or guarding GU:no cva tenderness NEURO: Pt is awake/alert, moves all extremitiesx4 EXTREMITIES: pulses normal, full ROM SKIN: warm, color normal PSYCH: no abnormalities of mood noted HIP:   Filed Vitals:   04/19/13 0940  BP: 112/78  Pulse: 94  Temp: 98.1 F (36.7 C)  TempSrc: Oral  Resp: 18  Height: 5' 10.5" (1.791 m)  Weight: 130 lb (58.968 kg)  SpO2: 99%   There is pain with external rotation. There is no pain with internal rotation. There is tenderness in the left SI joint. Deep tendon reflexes are 2+ and symmetrical. Straight leg raising is normal. Motor strength is 5 out of 5 all muscle groups left leg [facility] reading (PRIMARY) by  Dr. Cleta Alberts hip films are normal back films are normal      Assessment & Plan:  The patient can take a low-dose nonsteroidal for his back pain. He can take the Flexeril as needed he has lidocaine  patches he has used in the past and this would also be acceptable. He is currently undergoing physical therapy with [facility] and they can work on his back and hip.

## 2013-05-14 ENCOUNTER — Encounter: Payer: Medicare Other | Admitting: Family Medicine

## 2013-05-19 DIAGNOSIS — Z0271 Encounter for disability determination: Secondary | ICD-10-CM

## 2013-05-21 ENCOUNTER — Telehealth: Payer: Self-pay

## 2013-05-21 NOTE — Telephone Encounter (Signed)
FMLA ppwk for Ebony Hail, pt's wife, is in Dr. Lenn Cal box to be completed.

## 2013-05-26 ENCOUNTER — Telehealth: Payer: Self-pay

## 2013-05-26 NOTE — Telephone Encounter (Signed)
Looking through chart, pt uses patches for migraines and other meds pt has tried/failed include Nucynta, Tylox, Tyl 3, oxycodone, topiramate, midrin, diclofenac, Lucynta and injections in the neck.

## 2013-05-26 NOTE — Telephone Encounter (Signed)
Pt dropped off letter from [facility] that lidocaine patches need PA. Completed form and faxed.

## 2013-05-28 NOTE — Telephone Encounter (Signed)
Received fax from [facility] stating that they do not cover the PAs for pt's Medicare plan and went to website for [facility] and printed off form. Completed and faxed to a different fax # from first form. Waiting for decision.

## 2013-06-01 ENCOUNTER — Ambulatory Visit (INDEPENDENT_AMBULATORY_CARE_PROVIDER_SITE_OTHER): Payer: Medicare Other | Admitting: Family Medicine

## 2013-06-01 ENCOUNTER — Telehealth: Payer: Self-pay | Admitting: *Deleted

## 2013-06-01 ENCOUNTER — Encounter: Payer: Self-pay | Admitting: Family Medicine

## 2013-06-01 VITALS — BP 102/86 | HR 98 | Temp 97.9°F | Resp 16 | Ht 70.0 in | Wt 126.4 lb

## 2013-06-01 DIAGNOSIS — M542 Cervicalgia: Secondary | ICD-10-CM

## 2013-06-01 DIAGNOSIS — G43909 Migraine, unspecified, not intractable, without status migrainosus: Secondary | ICD-10-CM

## 2013-06-01 DIAGNOSIS — R519 Headache, unspecified: Secondary | ICD-10-CM

## 2013-06-01 DIAGNOSIS — R51 Headache: Secondary | ICD-10-CM

## 2013-06-01 DIAGNOSIS — G8929 Other chronic pain: Secondary | ICD-10-CM

## 2013-06-01 DIAGNOSIS — G4700 Insomnia, unspecified: Secondary | ICD-10-CM

## 2013-06-01 DIAGNOSIS — K3184 Gastroparesis: Secondary | ICD-10-CM

## 2013-06-01 DIAGNOSIS — M25519 Pain in unspecified shoulder: Secondary | ICD-10-CM

## 2013-06-01 MED ORDER — LIDOCAINE 5 % EX PTCH: 1.0000 | MEDICATED_PATCH | CUTANEOUS | Status: DC

## 2013-06-01 MED ORDER — CLONAZEPAM 0.5 MG PO TABS: 0.5000 mg | ORAL_TABLET | Freq: Every day | ORAL | Status: DC

## 2013-06-01 MED ORDER — CYCLOBENZAPRINE HCL 5 MG PO TABS: 5.0000 mg | ORAL_TABLET | Freq: Three times a day (TID) | ORAL | Status: DC | PRN

## 2013-06-01 MED ORDER — MIRTAZAPINE 30 MG PO TABS: 30.0000 mg | ORAL_TABLET | Freq: Every day | ORAL | Status: DC

## 2013-06-01 NOTE — Telephone Encounter (Signed)
Left message for patient to send fax number information through [name] from the insurance company ([employer]) so the letter concerning the patches can be sent to them.

## 2013-06-01 NOTE — Progress Notes (Signed)
53 yo with chronic migraines, gastroparesis, and most recently, problems with constipation.  The treatments with acupuncture (Dr. Minna Antis) and physical therapy with Dr. Belia Heman have greatly reduced the migraine burden.  He has gone almost 8 months without Tylox.  Patient uses lidocaine patches for trigger points on back which lead to migraines.  Patient has hand washing compulsion.  We spent 45 minutes face to face,  filling out FMLA, appealing the lidoderm rejection, answering questions about digestion.  Objective:  NAD Slight middle aged man who is rather self-absorbed.   Hands:  Dry with some early fissuring.  Insomnia - Plan: clonazePAM (KLONOPIN) 0.5 MG tablet  Migraine - Plan: cyclobenzaprine (FLEXERIL) 5 MG tablet, mirtazapine (REMERON) 30 MG tablet  Neck pain on left side - Plan: mirtazapine (REMERON) 30 MG tablet, lidocaine (LIDODERM) 5 %  Chronic headaches - Plan: mirtazapine (REMERON) 30 MG tablet  Pain in joint, shoulder region - Plan: lidocaine (LIDODERM) 5 %  Follow up in a month and physical in April Signed, [name], MD

## 2013-06-01 NOTE — Patient Instructions (Addendum)
Reglan can have longterm side effects of tremor, so that if you develop tremor, we will have to stop it.  Also, just take the Reglan for the worst of the constipation or bloating.  Digestive enzymes are used for people with pancreatitis.  They don't really help with gastroparesis.  I will appeal the lidoderm patch prescription denial.  Probiotics help just about everyone with gastrointestinal problems.  Keep them in the refrigerator.  Dilute the [employer] Organic Apple Cider Vinegar 1:1 and take 4 oz once or twice a day with food.  Take the fiber in the morning.  If you eat a well rounded diet, you shouldn't need expensive shakes or supplements.  Include 4 oz of protein, 3 vegetables and two fruit servings every day for good well-rounded diet.  The best approach to the migraines and gastroparesis is accupuncture and physical therapy with daily exercise of 1/2 hour.  Use cortaid cream after washing your hands to stop the dry, cracking caused by soaps.  You should be able to take doxycycline or levaquin for the root canal procedure.

## 2013-06-04 ENCOUNTER — Encounter: Payer: Self-pay | Admitting: Family Medicine

## 2013-06-24 ENCOUNTER — Telehealth: Payer: Self-pay

## 2013-06-24 DIAGNOSIS — F411 Generalized anxiety disorder: Secondary | ICD-10-CM

## 2013-06-24 NOTE — Telephone Encounter (Signed)
Pt states ha has been prescribed klonopin .05mg  by dr Joseph Art, pt is requesting a changed in the rx to 1 mg and he will cut pill in half.  Pt thinks he may be allergic to the dye in the .05 pill

## 2013-06-25 NOTE — Telephone Encounter (Signed)
Can we please get more information about what is happening to the patient.  He has a very long list of allergies.

## 2013-06-28 MED ORDER — CLONAZEPAM 1 MG PO TABS: 0.5000 mg | ORAL_TABLET | Freq: Every day | ORAL | Status: DC | PRN

## 2013-06-28 NOTE — Telephone Encounter (Signed)
Pt states that when he takes the 0.5mg  it causes abdominal cramps and diarrhea.  He called the pharmacist at [facility] and asked could it be the dye in the pills and they told him yes.  He also had some left over 1mg  and took half of that and he states that he was fine when he woke up.  Can we get a new script for 1mg  to take 1/2 tab.

## 2013-06-28 NOTE — Telephone Encounter (Signed)
Pt notified that rx was sent in 

## 2013-07-10 ENCOUNTER — Ambulatory Visit (INDEPENDENT_AMBULATORY_CARE_PROVIDER_SITE_OTHER): Payer: Medicare Other | Admitting: Internal Medicine

## 2013-07-10 ENCOUNTER — Telehealth: Payer: Self-pay

## 2013-07-10 VITALS — BP 122/74 | HR 104 | Temp 98.4°F | Resp 17 | Ht 70.5 in | Wt 135.0 lb

## 2013-07-10 DIAGNOSIS — R059 Cough, unspecified: Secondary | ICD-10-CM

## 2013-07-10 DIAGNOSIS — R509 Fever, unspecified: Secondary | ICD-10-CM

## 2013-07-10 DIAGNOSIS — R519 Headache, unspecified: Secondary | ICD-10-CM

## 2013-07-10 DIAGNOSIS — J209 Acute bronchitis, unspecified: Secondary | ICD-10-CM

## 2013-07-10 DIAGNOSIS — R05 Cough: Secondary | ICD-10-CM

## 2013-07-10 DIAGNOSIS — G43901 Migraine, unspecified, not intractable, with status migrainosus: Secondary | ICD-10-CM

## 2013-07-10 DIAGNOSIS — R51 Headache: Principal | ICD-10-CM

## 2013-07-10 LAB — POCT INFLUENZA A/B
Influenza A, POC: NEGATIVE
Influenza B, POC: NEGATIVE

## 2013-07-10 MED ORDER — LEVOFLOXACIN 500 MG PO TABS: 500.0000 mg | ORAL_TABLET | Freq: Every day | ORAL | Status: DC

## 2013-07-10 NOTE — Progress Notes (Signed)
   Subjective:    Patient ID: William [name], male    DOB: Jul 03, 1960, 53 y.o.   MRN: [id number]  HPI Patient presents with watery burning eyes, body aches, chills, sweats, coughing, and sneezing.  Patient said last year these symptoms turned into pneumonia.  Patient doesn't feel as bad as he did last year.  He's here to get something for his symptoms before he gets so bad.    Review of Systems     Objective:   Physical Exam  Vitals reviewed. Constitutional: He is oriented to person, place, and time. He appears well-developed and well-nourished. No distress.  HENT:  Head: Normocephalic.  Right Ear: External ear normal.  Left Ear: External ear normal.  Nose: Mucosal edema, rhinorrhea and sinus tenderness present.  Eyes: Conjunctivae and EOM are normal. Pupils are equal, round, and reactive to light.  Neck: Normal range of motion. Neck supple.  Cardiovascular: Normal rate, regular rhythm and normal heart sounds.   Pulmonary/Chest: Effort normal. Not tachypneic. He has no decreased breath sounds. He has no wheezes. He has rhonchi.  Neurological: He is alert and oriented to person, place, and time. No cranial nerve deficit.  Psychiatric: He has a normal mood and affect. His behavior is normal.     Results for orders placed in visit on 07/10/13  POCT INFLUENZA A/B      Result Value Ref Range   Influenza A, POC Negative     Influenza B, POC Negative          Assessment & Plan:  Bronchitis Delsym/Levaquin 500mg

## 2013-07-10 NOTE — Telephone Encounter (Signed)
Janelle in scheduling at Dr Donaciano Eva office [facility] called on behalf of pt. States he saw Dr Elder Cyphers this morning for migraine and then called Dr Georgie Chard office for an appt. Pt needs referral. Pt would like to seek other means of treatment besides medication and would like to see Dr Tomi Likens for treatment.  Please enter referral asap so pt can be scheduled at [facility] office.   Thank you!  Becky - referrals

## 2013-07-10 NOTE — Patient Instructions (Signed)
Acute Bronchitis Bronchitis is inflammation of the airways that extend from the windpipe into the lungs (bronchi). The inflammation often causes mucus to develop. This leads to a cough, which is the most common symptom of bronchitis.  In acute bronchitis, the condition usually develops suddenly and goes away over time, usually in a couple weeks. Smoking, allergies, and asthma can make bronchitis worse. Repeated episodes of bronchitis may cause further lung problems.  CAUSES Acute bronchitis is most often caused by the same virus that causes a cold. The virus can spread from person to person (contagious).  SIGNS AND SYMPTOMS   Cough.   Fever.   Coughing up mucus.   Body aches.   Chest congestion.   Chills.   Shortness of breath.   Sore throat.  DIAGNOSIS  Acute bronchitis is usually diagnosed through a physical exam. Tests, such as chest X-rays, are sometimes done to rule out other conditions.  TREATMENT  Acute bronchitis usually goes away in a couple weeks. Often times, no medical treatment is necessary. Medicines are sometimes given for relief of fever or cough. Antibiotics are usually not needed but may be prescribed in certain situations. In some cases, an inhaler may be recommended to help reduce shortness of breath and control the cough. A cool mist vaporizer may also be used to help thin bronchial secretions and make it easier to clear the chest.  HOME CARE INSTRUCTIONS  Get plenty of rest.   Drink enough fluids to keep your urine clear or pale yellow (unless you have a medical condition that requires fluid restriction). Increasing fluids may help thin your secretions and will prevent dehydration.   Only take over-the-counter or prescription medicines as directed by your health care provider.   Avoid smoking and secondhand smoke. Exposure to cigarette smoke or irritating chemicals will make bronchitis worse. If you are a smoker, consider using nicotine gum or skin  patches to help control withdrawal symptoms. Quitting smoking will help your lungs heal faster.   Reduce the chances of another bout of acute bronchitis by washing your hands frequently, avoiding people with cold symptoms, and trying not to touch your hands to your mouth, nose, or eyes.   Follow up with your health care provider as directed.  SEEK MEDICAL CARE IF: Your symptoms do not improve after 1 week of treatment.  SEEK IMMEDIATE MEDICAL CARE IF:  You develop an increased fever or chills.   You have chest pain.   You have severe shortness of breath.  You have bloody sputum.   You develop dehydration.  You develop fainting.  You develop repeated vomiting.  You develop a severe headache. MAKE SURE YOU:   Understand these instructions.  Will watch your condition.  Will get help right away if you are not doing well or get worse. Document Released: 05/24/2004 Document Revised: 12/17/2012 Document Reviewed: 10/07/2012 Exit[employer] Patient Information 2014 [employer].  

## 2013-07-10 NOTE — Telephone Encounter (Signed)
Referral has been entered.

## 2013-07-13 ENCOUNTER — Telehealth: Payer: Self-pay

## 2013-07-14 ENCOUNTER — Other Ambulatory Visit: Payer: Self-pay

## 2013-07-14 NOTE — Telephone Encounter (Signed)
Checked on status of this and it was denied, and Dr Joseph Art wrote appeal letter at [place] w/pt on 06/01/13, but it looks like we were waiting for pt to [facility] w/fax # to Appeals depart. I called [employer] and was given fax # to appeals and faxed letter written by Dr L along w/list of meds tried/failed below.

## 2013-07-14 NOTE — Telephone Encounter (Signed)
See notes under 05/26/13 phone message.

## 2013-07-16 ENCOUNTER — Encounter: Payer: Self-pay | Admitting: Family Medicine

## 2013-07-16 ENCOUNTER — Ambulatory Visit (INDEPENDENT_AMBULATORY_CARE_PROVIDER_SITE_OTHER): Payer: Medicare Other | Admitting: Family Medicine

## 2013-07-16 VITALS — BP 112/73 | HR 94 | Temp 98.1°F | Resp 16 | Ht 70.0 in | Wt 129.0 lb

## 2013-07-16 DIAGNOSIS — L989 Disorder of the skin and subcutaneous tissue, unspecified: Secondary | ICD-10-CM

## 2013-07-16 DIAGNOSIS — R51 Headache: Secondary | ICD-10-CM

## 2013-07-16 DIAGNOSIS — L853 Xerosis cutis: Secondary | ICD-10-CM

## 2013-07-16 DIAGNOSIS — L738 Other specified follicular disorders: Secondary | ICD-10-CM

## 2013-07-16 DIAGNOSIS — L923 Foreign body granuloma of the skin and subcutaneous tissue: Secondary | ICD-10-CM

## 2013-07-16 DIAGNOSIS — F411 Generalized anxiety disorder: Secondary | ICD-10-CM

## 2013-07-16 LAB — GLUCOSE, POCT (MANUAL RESULT ENTRY): POC Glucose: 104 mg/dl — AB (ref 70–99)

## 2013-07-16 MED ORDER — IPRATROPIUM BROMIDE 0.03 % NA SOLN
2.0000 | Freq: Two times a day (BID) | NASAL | Status: DC
Start: 2013-07-16 — End: 2014-05-20

## 2013-07-16 MED ORDER — BUSPIRONE HCL 7.5 MG PO TABS: 7.5000 mg | ORAL_TABLET | Freq: Three times a day (TID) | ORAL | Status: DC

## 2013-07-16 NOTE — Progress Notes (Addendum)
Subjective:  This chart was scribed for [name], MD by Donato Schultz, Medical Scribe. This patient was seen in Room 27 and the patient's care was started at 12:37 PM.   Patient ID: William [name], male    DOB: 1960/10/12, 53 y.o.   MRN: [id number]  HPI HPI Comments: William [name] is a 53 y.o. male who presents to the [facility] for a follow-up appointment after being diagnosed with Bronchitis by Dr. Elder Cyphers.  The patient states that he is no longer coughing but complains of itchy eyes, sneezing, sore throat, and night sweats.  The patient states that his headaches have improved.  The patient states that he would like a referral to a neurologist, dermatologist, and ENT specialist for both he and his wife.  He states that he has not been to physical therapy within the last couple of weeks but plans to resume therapy on the first of next month.    The patient states that he was denied a refill of a medication because of the reason it was prescribed.   He states that he has been experiencing tingling in his feet bilaterally, frequency, and increased thirst.  He states that he is worried that he has DM.   He states that his wife has RSD, sinus problems, and eczema.    Past Medical History  Diagnosis Date   Allergy    Depression    Migraine headache    Anxiety    Mitral valve prolapse    Heart murmur    No past surgical history on file. Family History  Problem Relation Age of Onset   Hypertension Mother    Hypertension Brother    History   Social History   Marital Status: Married    Spouse Name: N/A    Number of Children: N/A   Years of Education: N/A   Occupational History   Not on file.   Social History Main Topics   Smoking status: Never Smoker    Smokeless tobacco: Not on file   Alcohol Use: No   Drug Use: No   Sexual Activity: Yes   Other Topics Concern   Not on file   Social History Narrative   No  narrative on file   Allergies  Allergen Reactions   Cephalexin Diarrhea and Nausea And Vomiting   Cephalosporins Diarrhea and Nausea And Vomiting   Compazine [Prochlorperazine Edisylate]     uncontrollable shake - tremor   Erythromycin Nausea And Vomiting   Ketorolac Tromethamine     REACTION: Reaction not known   Latex     REACTION: Reaction not known   Nalbuphine     REACTION: Reaction not known   Prednisone Nausea And Vomiting   Prochlorperazine Edisylate     REACTION: shakes/tremors   Topamax [Topiramate] Diarrhea and Nausea And Vomiting   Cefdinir Diarrhea, Nausea And Vomiting and Rash   Neosporin [Neomycin-Bacitracin Zn-Polymyx] Rash    Review of Systems  Constitutional: Positive for diaphoresis.  HENT: Positive for sneezing.   Eyes: Positive for itching.  Respiratory: Negative for cough.   Genitourinary: Positive for frequency.  All other systems reviewed and are negative.    Objective:  Physical Exam  Nursing note and vitals reviewed. Constitutional: He is oriented to person, place, and time. He appears well-developed and well-nourished. No distress.  HENT:  Head: Normocephalic and atraumatic.  Eyes: EOM are normal.  Neck: Neck supple. No tracheal deviation present.  Cardiovascular: Normal rate.  Pulmonary/Chest: Effort normal. No respiratory distress.  Musculoskeletal: Normal range of motion.  Neurological: He is alert and oriented to person, place, and time.  Skin: Skin is warm and dry.  Psychiatric: He has a normal mood and affect. His behavior is normal.   Patient's skin is dry and scaly with some thickening of his toenails. Results for orders placed in visit on 07/10/13  POCT INFLUENZA A/B      Result Value Ref Range   Influenza A, POC Negative     Influenza B, POC Negative       BP 112/73   Pulse 94   Temp(Src) 98.1 F (36.7 C)   Resp 16   Ht 5\' 10"  (1.778 m)   Wt 129 lb (58.514 kg)   BMI 18.51 kg/m2   SpO2 97% Assessment & Plan:      I personally performed the services described in this documentation, which was scribed in my presence. The recorded information has been reviewed and is accurate.  I make referral for neurology, dermatology. After I made these, patient said he would make his own appointments.  Headache(784.0) - Plan: Ambulatory referral to Neurology, POCT glucose (manual entry), busPIRone (BUSPAR) 7.5 MG tablet  Xerosis cutis - Plan: Ambulatory referral to Dermatology, POCT glucose (manual entry)  Skin lesion of left leg - Plan: Ambulatory referral to Dermatology, POCT glucose (manual entry)  Anxiety state, unspecified - Plan: busPIRone (BUSPAR) 7.5 MG tablet  I also called in an Atrovent nasal spray for rhinorrhea. Signed, [name], MD

## 2013-07-20 ENCOUNTER — Ambulatory Visit (INDEPENDENT_AMBULATORY_CARE_PROVIDER_SITE_OTHER): Payer: Medicare Other | Admitting: Emergency Medicine

## 2013-07-20 ENCOUNTER — Ambulatory Visit: Payer: Medicare Other

## 2013-07-20 VITALS — BP 110/70 | HR 99 | Temp 98.1°F | Resp 16 | Ht 70.0 in | Wt 133.2 lb

## 2013-07-20 DIAGNOSIS — J069 Acute upper respiratory infection, unspecified: Secondary | ICD-10-CM

## 2013-07-20 DIAGNOSIS — R05 Cough: Secondary | ICD-10-CM

## 2013-07-20 DIAGNOSIS — R059 Cough, unspecified: Secondary | ICD-10-CM

## 2013-07-20 LAB — POCT CBC
Granulocyte percent: 59.3 %G (ref 37–80)
HCT, POC: 47.5 % (ref 43.5–53.7)
Hemoglobin: 14.7 g/dL (ref 14.1–18.1)
LYMPH, POC: 1.7 (ref 0.6–3.4)
MCH, POC: 30.4 pg (ref 27–31.2)
MCHC: 30.9 g/dL — AB (ref 31.8–35.4)
MCV: 98.1 fL — AB (ref 80–97)
MID (CBC): 0.3 (ref 0–0.9)
MPV: 7.9 fL (ref 0–99.8)
PLATELET COUNT, POC: 348 10*3/uL (ref 142–424)
POC Granulocyte: 3 (ref 2–6.9)
POC LYMPH PERCENT: 34.9 %L (ref 10–50)
POC MID %: 5.8 % (ref 0–12)
RBC: 4.84 M/uL (ref 4.69–6.13)
RDW, POC: 13.7 %
WBC: 5 10*3/uL (ref 4.6–10.2)

## 2013-07-20 MED ORDER — OLOPATADINE HCL 0.1 % OP SOLN: 1.0000 [drp] | Freq: Two times a day (BID) | OPHTHALMIC | Status: DC

## 2013-07-20 NOTE — Progress Notes (Addendum)
Subjective:    Patient ID: William [name], male    DOB: 17-Jan-1961, 53 y.o.   MRN: [id number]  HPI Scribed for [name], the patient was seen in room 9. This chart was scribed by Denice Bors, ED scribe. Patient's care was started at 8:23 AM  HPI Comments: Hx was provided by the pt.  William [name] is a 53 y.o. male who presents to the [facility] for a follow up appointment for seasonal allergies. Reports associated sinus pressure, headaches, sneezing, itchy eyes, productive cough with clear sputum, and clear rhinorrhea is persistent. Denies any aggravating factors. Reports acupuncture moderately alleviates headaches. Denies associated fever, and shortness of breath. Reports PMHx of seasonal allergies.   Additionally, pt is requesting a recheck on left thumb wound.   Past Medical History  Diagnosis Date  . Allergy   . Depression   . Migraine headache   . Anxiety   . Mitral valve prolapse   . Heart murmur     History reviewed. No pertinent past surgical history.  Family History  Problem Relation Age of Onset  . Hypertension Mother   . Hypertension Brother     History   Social History  . Marital Status: Married    Spouse Name: N/A    Number of Children: N/A  . Years of Education: N/A   Occupational History  . Not on file.   Social History Main Topics  . Smoking status: Never Smoker   . Smokeless tobacco: Not on file  . Alcohol Use: No  . Drug Use: No  . Sexual Activity: Yes   Other Topics Concern  . Not on file   Social History Narrative  . No narrative on file    Allergies  Allergen Reactions  . Cephalexin Diarrhea and Nausea And Vomiting  . Cephalosporins Diarrhea and Nausea And Vomiting  . Compazine [Prochlorperazine Edisylate]     uncontrollable shake - tremor  . Erythromycin Nausea And Vomiting  . Ketorolac Tromethamine     REACTION: Reaction not known  . Latex     REACTION: Reaction not known  .  Nalbuphine     REACTION: Reaction not known  . Prednisone Nausea And Vomiting  . Prochlorperazine Edisylate     REACTION: shakes/tremors  . Topamax [Topiramate] Diarrhea and Nausea And Vomiting  . Cefdinir Diarrhea, Nausea And Vomiting and Rash  . Neosporin [Neomycin-Bacitracin Zn-Polymyx] Rash    Patient Active Problem List   Diagnosis Date Noted  . Hyperlipidemia 04/04/2012  . MIGRAINE HEADACHE 07/15/2009  . MITRAL VALVE PROLAPSE 07/15/2009  . IRRITABLE BOWEL SYNDROME 07/15/2009  . PALPITATIONS 06/28/2009  . CHEST PAIN 06/28/2009    Filed Vitals:   07/20/13 0812  BP: 110/70  Pulse: 99  Temp: 98.1 F (36.7 C)  TempSrc: Oral  Resp: 16  Height: 5\' 10"  (1.778 m)  Weight: 133 lb 3.2 oz (60.419 kg)  SpO2: 98%         Review of Systems  Constitutional: Negative for fever.  HENT: Positive for rhinorrhea, sinus pressure and sneezing.   Eyes: Positive for itching.  Respiratory: Positive for cough. Negative for shortness of breath.   Neurological: Positive for headaches.       Objective:   Physical Exam  Physical Exam  Vitals reviewed. Constitutional: He is oriented to person, place, and time. He appears well-developed and well-nourished. No distress.  HENT:  Head: Normocephalic and atraumatic.  Throat: Oropharynx is clear and moist. Mucous membranes normal.  Uvula is midline.  Eyes: EOM are normal. Conjunctivae are clear.   Neck: Neck supple. No tracheal deviation present.  Cardiovascular: Normal rate.   Pulmonary/Chest: Effort normal. No respiratory distress. Normal breath sounds.  Musculoskeletal: Normal range of motion.  Neurological: He is alert and oriented to person, place, and time.  Skin: Skin is warm and dry. Left thumb laceration is well-healing. No erythema. No drainage. Neurovascularly intact.  Psychiatric: He has a normal mood and affect. His behavior is normal.    Orders Placed This Encounter  Procedures  . DG Chest 2 View    Standing Status:  Future     Number of Occurrences:      Standing Expiration Date: 09/20/2014    Order Specific Question:  Reason for Exam (SYMPTOM  OR DIAGNOSIS REQUIRED)    Answer:  cough    Order Specific Question:  Preferred imaging location?    Answer:  External  . POCT CBC   Results for orders placed in visit on 07/20/13  POCT CBC      Result Value Ref Range   WBC 5.0  4.6 - 10.2 K/uL   Lymph, poc 1.7  0.6 - 3.4   POC LYMPH PERCENT 34.9  10 - 50 %L   MID (cbc) 0.3  0 - 0.9   POC MID % 5.8  0 - 12 %M   POC Granulocyte 3.0  2 - 6.9   Granulocyte percent 59.3  37 - 80 %G   RBC 4.84  4.69 - 6.13 M/uL   Hemoglobin 14.7  14.1 - 18.1 g/dL   HCT, POC 47.5  43.5 - 53.7 %   MCV 98.1 (*) 80 - 97 fL   MCH, POC 30.4  27 - 31.2 pg   MCHC 30.9 (*) 31.8 - 35.4 g/dL   RDW, POC 13.7     Platelet Count, POC 348  142 - 424 K/uL   MPV 7.9  0 - 99.8 fL  [facility] reading (PRIMARY) by  Dr. Everlene Farrier no acute changes. There is a minimal scarring in the left base       Assessment & Plan:   Patient not showing signs of worsening pneumonia. His white count is 5000 no infiltrates on x-ray. I suspect his symptoms are primarily allergy related.. he already has a prescription for Atrovent nasal spray he has not started using.   Patanol drops to use for his eyes. I personally performed the services described in this documentation, which was scribed in my presence. The recorded information has been reviewed and is accurate.

## 2013-07-20 NOTE — Telephone Encounter (Signed)
Info from pt put in Dr Lenn Cal box for review.

## 2013-07-20 NOTE — Telephone Encounter (Signed)
Received call that appeal was denied, and ins was going to send letter w/details. I have never received letter. Called pt to notify and he stated that Dr L had said that he would try another appeal when pt saw him on 07/16/13. Pt has letter from ins w/details of first appeal denial and is faxing to me for Dr L.

## 2013-07-28 NOTE — Telephone Encounter (Signed)
Received letter from [facility] that they received add'l info and have reopened case to consider new info. Will receive decision w/in 120 calendar days.

## 2013-07-30 ENCOUNTER — Telehealth: Payer: Self-pay

## 2013-07-30 NOTE — Telephone Encounter (Signed)
Duplicate message- see other phone message

## 2013-07-30 NOTE — Telephone Encounter (Signed)
Letter of denial received from [facility] [address] Notified pt of denial.

## 2013-07-30 NOTE — Telephone Encounter (Signed)
Patient would like to speak with dr Everlene Farrier or nurse about his klonopin if possible [phone]

## 2013-07-30 NOTE — Telephone Encounter (Signed)
Klonopin directions are wrong when patient went to pick up; patient is very sensitive to medications. 1/2 pill was written for him to take and its supposes to take 1 pill. Patient see's Dr. Everlene Farrier and Joseph Art.      Best: [phone]

## 2013-07-30 NOTE — Telephone Encounter (Signed)
Pt did not start weening off of the Klonopin he needs a refill on this now. He was unable to take the Buspar, he says it makes him feel sick. He did not contact us but decided to stay on the Klonopin 1mg .  Please advise if we can send a new script to the pharmacy- they will not let him get a refill until the directions are changed.

## 2013-07-30 NOTE — Telephone Encounter (Signed)
Patient called stated he spoke with [name]. He will like for his prescription to be called in at [facility] [address].

## 2013-07-30 NOTE — Telephone Encounter (Signed)
Opened in error

## 2013-07-31 ENCOUNTER — Telehealth: Payer: Self-pay

## 2013-07-31 ENCOUNTER — Ambulatory Visit: Payer: Medicare Other | Admitting: Neurology

## 2013-07-31 NOTE — Telephone Encounter (Signed)
Called in clonazepam 1 mg 1 poqd prn #30 0 refills to [facility]. Patient notified

## 2013-07-31 NOTE — Telephone Encounter (Signed)
Called in clonazepam 1 mg 1 poqd prn #30 0 refills to [facility]. See previous messages.

## 2013-07-31 NOTE — Telephone Encounter (Signed)
It's okay to renew the clonazepam 1 mg to be taken qd prn.  Please refill for me

## 2013-07-31 NOTE — Telephone Encounter (Signed)
Opened in error

## 2013-07-31 NOTE — Telephone Encounter (Signed)
Patient called stated he is following up with [name] on his refill on Klonopin 1 mg a night.

## 2013-08-06 ENCOUNTER — Encounter: Payer: Medicare Other | Admitting: Family Medicine

## 2013-08-08 NOTE — Telephone Encounter (Signed)
Script called in for pt. Pt was notified.

## 2013-08-12 ENCOUNTER — Ambulatory Visit (INDEPENDENT_AMBULATORY_CARE_PROVIDER_SITE_OTHER): Payer: Medicare Other | Admitting: Emergency Medicine

## 2013-08-12 VITALS — BP 120/72 | HR 84 | Resp 16 | Ht 70.0 in | Wt 133.0 lb

## 2013-08-12 DIAGNOSIS — F411 Generalized anxiety disorder: Secondary | ICD-10-CM

## 2013-08-12 MED ORDER — CLONAZEPAM 0.25 MG PO TBDP: ORAL_TABLET | ORAL | Status: DC

## 2013-08-12 NOTE — Progress Notes (Signed)
Subjective:     Patient ID: William [name], male   DOB: 1961-01-26, 52 y.o.   MRN: [id number]  HPI 53 YO AA male presents to [facility] wanting to discuss a plan for weaning off of his klonopin. He currently is taking 1 mg once in the evening and would like to wean off. He tried going from one milligram to .5 mg but started having stomach cramps and diarrhea and re-started the 1 mg. He wants a new plan to decrease his medication and eventually stop taking the medicine.   Review of Systems  Constitutional: Negative for fever, activity change, appetite change and fatigue.  HENT: Negative for congestion, ear pain, rhinorrhea and sinus pressure.   Eyes: Negative for pain and itching.  Respiratory: Negative for cough, chest tightness, shortness of breath and wheezing.   Cardiovascular: Negative for chest pain, palpitations and leg swelling.  Gastrointestinal: Negative for abdominal pain, constipation, blood in stool, abdominal distention and anal bleeding.  Endocrine: Negative for polyuria.  Musculoskeletal: Negative for arthralgias and myalgias.  Neurological: Negative for syncope, weakness, light-headedness and headaches.  Psychiatric/Behavioral: Negative for confusion and agitation.       Objective:   Physical Exam  Constitutional: He is oriented to person, place, and time. He appears well-developed and well-nourished. No distress.  HENT:  Head: Normocephalic and atraumatic.  Mouth/Throat: Oropharynx is clear and moist. No oropharyngeal exudate.  Eyes: Conjunctivae are normal. Pupils are equal, round, and reactive to light.  Cardiovascular: Normal rate, regular rhythm, normal heart sounds and intact distal pulses.  Exam reveals no gallop and no friction rub.   No murmur heard. Pulmonary/Chest: Effort normal and breath sounds normal. No respiratory distress. He has no wheezes. He has no rales.  Abdominal: Soft. Bowel sounds are normal. He exhibits no distension. There is no tenderness.  There is no rebound and no guarding.  Neurological: He is alert and oriented to person, place, and time.  Skin: Skin is warm and dry. No rash noted. He is not diaphoretic. No erythema.       Assessment:    Generalized anxiety disorder - Plan: clonazePAM (KLONOPIN) 0.25 MG disintegrating tablet       Plan:    discussed with pt the plan to wean down to .75 mg daily from 1 mg daily. He is to take 1/2 of a 1 mg tablet daily in the AM and 1 .25 dissolving tab in the evening for a total of .75 mg per day  Re-check in 1 month to discuss decreasing to .5 mg daily

## 2013-08-12 NOTE — Patient Instructions (Signed)
He is to take 1/2 of a 1 mg tablet daily in the AM and 1 .25 dissolving tab in the evening for a total of .75 mg per day  Re-check in 1 month to discuss decreasing to .5 mg daily

## 2013-08-14 ENCOUNTER — Ambulatory Visit: Payer: Self-pay | Admitting: Neurology

## 2013-08-20 ENCOUNTER — Encounter: Payer: Medicare Other | Admitting: Family Medicine

## 2013-08-25 ENCOUNTER — Ambulatory Visit (INDEPENDENT_AMBULATORY_CARE_PROVIDER_SITE_OTHER): Payer: Medicare Other | Admitting: Cardiovascular Disease

## 2013-08-25 ENCOUNTER — Encounter: Payer: Self-pay | Admitting: Cardiovascular Disease

## 2013-08-25 VITALS — BP 122/64 | HR 77 | Ht 70.0 in | Wt 140.0 lb

## 2013-08-25 DIAGNOSIS — R002 Palpitations: Secondary | ICD-10-CM

## 2013-08-25 DIAGNOSIS — R0789 Other chest pain: Secondary | ICD-10-CM

## 2013-08-25 NOTE — Progress Notes (Signed)
History of Present Illness: 53 yo AAM with history of palpitations, migraine headaches and irritable bowel syndrome with previous diagnosis of mitral valve prolapse in 1989 who is here today for cardiac follow up. I last saw him April 2014. He has been known to have PVCs in the past. He did not tolerate beta blockers in the past. I started him on Cardizem CD 120 mg per day in 2011. He had a normal echo and stress test in 2011. Holter monitor in 2011 with no evidence of SVT, atrial fibrillation. On 07/03/12 he began to have a productive cough and chest pain and was seen in the [facility]. CTA chest negative for PE. He was seen later that day by Dr. Everlene Farrier in the [facility] and given c/o chest pain at that time, was sent to [facility] ED. EKG with sinus tach, rate 107 bpm. Troponin was negative in the ED. He described sweating, coughing, fever, chills and pleuritic chest pain with cough. He had noticed his heart racing. I arranged a 48 hour monitor and an echo. Echo with normal LV size and function. 48 hour monitor with normal sinus rhythm and rare PACs.   He is here today for cardiac follow up. He is feeling well. No chest pain or SOB. No palpitations. He stopped taking Tylox for his migraines. He is tapering off of his Klonopin. He is excited to get off of these meds.   Primary Care Physician: [facility] [facility], Dr. Joseph Art  Past Medical History  Diagnosis Date  . Allergy   . Depression   . Migraine headache   . Anxiety   . Mitral valve prolapse   . Heart murmur    No past surgical history on file.  Current Outpatient Prescriptions  Medication Sig Dispense Refill  . clonazePAM (KLONOPIN) 0.25 MG disintegrating tablet Take one tablet daily  30 tablet  1  . clonazePAM (KLONOPIN) 1 MG tablet Take 0.5 tablets (0.5 mg total) by mouth daily as needed for anxiety.  30 tablet  5  . ipratropium (ATROVENT) 0.03 % nasal spray Place 2 sprays into the nose 2 (two) times daily.  30 mL  5    . lidocaine (LIDODERM) 5 % Place 1 patch onto the skin daily. Remove & Discard patch within 12 hours or as directed by MD  30 patch  0  . metoCLOPramide (REGLAN) 5 MG tablet Take 5 mg by mouth every 6 (six) hours as needed for nausea.      . mirtazapine (REMERON) 30 MG tablet Take 1 tablet (30 mg total) by mouth at bedtime.  90 tablet  1  . Naproxen Sodium (ALEVE PO) Take by mouth.      [name] olopatadine (PATANOL) 0.1 % ophthalmic solution Place 1 drop into both eyes 2 (two) times daily.  5 mL  11   No current facility-administered medications for this visit.    Allergies  Allergen Reactions  . Cephalexin Diarrhea and Nausea And Vomiting  . Cephalosporins Diarrhea and Nausea And Vomiting  . Compazine [Prochlorperazine Edisylate]     uncontrollable shake - tremor  . Erythromycin Nausea And Vomiting  . Ketorolac Tromethamine     REACTION: Reaction not known  . Latex     REACTION: Reaction not known  . Nalbuphine     REACTION: Reaction not known  . Prednisone Nausea And Vomiting  . Prochlorperazine Edisylate     REACTION: shakes/tremors  . Topamax [Topiramate] Diarrhea and Nausea And Vomiting  .  Cefdinir Diarrhea, Nausea And Vomiting and Rash  . Neosporin [Neomycin-Bacitracin Zn-Polymyx] Rash    History   Social History  . Marital Status: Married    Spouse Name: N/A    Number of Children: N/A  . Years of Education: N/A   Occupational History  . Not on file.   Social History Main Topics  . Smoking status: Never Smoker   . Smokeless tobacco: Not on file  . Alcohol Use: No  . Drug Use: No  . Sexual Activity: Yes   Other Topics Concern  . Not on file   Social History Narrative  . No narrative on file    Family History  Problem Relation Age of Onset  . Hypertension Mother   . Hypertension Brother     Review of Systems:  As stated in the HPI and otherwise negative.   BP 122/64  Pulse 77  Ht 5\' 10"  (1.778 m)  Wt 140 lb (63.504 kg)  BMI 20.09 kg/m2  Physical  Examination: General: Well developed, well nourished, NAD HEENT: OP clear, mucus membranes moist SKIN: warm, dry. No rashes. Neuro: No focal deficits Musculoskeletal: Muscle strength 5/5 all ext Psychiatric: Mood and affect normal Neck: No JVD, no carotid bruits, no thyromegaly, no lymphadenopathy. Lungs:Clear bilaterally, no wheezes, rhonci, crackles Cardiovascular: Regular rate and rhythm. No murmurs, gallops or rubs. Abdomen:Soft. Bowel sounds present. Non-tender.  Extremities: No lower extremity edema. Pulses are 2 + in the bilateral DP/PT.  Echo 07/10/12: Left ventricle: The cavity size was normal. Wall thickness was normal. Systolic function was normal. The estimated ejection fraction was in the range of 60% to 65%. Wall motion was normal; there were no regional wall motion abnormalities. Left ventricular diastolic function parameters were normal.   EKG: NSR, rate 77 bpm.   Assessment and Plan:   1. Chest pain: Atypical. No recurrence. He has been feeling well.   2. Palpitations: PACs on cardiac monitor in 2014. No recent palpitations. Avoid stimulants and OTC meds with stimulant qualities.   I will see him as needed.

## 2013-08-25 NOTE — Patient Instructions (Signed)
Your physician recommends that you schedule a follow-up appointment as needed with Dr. McAlhany   

## 2013-08-31 ENCOUNTER — Encounter: Payer: Self-pay | Admitting: Family Medicine

## 2013-08-31 ENCOUNTER — Ambulatory Visit (INDEPENDENT_AMBULATORY_CARE_PROVIDER_SITE_OTHER): Payer: Medicare Other | Admitting: Family Medicine

## 2013-08-31 VITALS — BP 110/70 | HR 86 | Temp 97.9°F | Resp 16 | Ht 70.0 in | Wt 134.8 lb

## 2013-08-31 DIAGNOSIS — E785 Hyperlipidemia, unspecified: Secondary | ICD-10-CM

## 2013-08-31 DIAGNOSIS — F411 Generalized anxiety disorder: Secondary | ICD-10-CM

## 2013-08-31 DIAGNOSIS — G43909 Migraine, unspecified, not intractable, without status migrainosus: Secondary | ICD-10-CM

## 2013-08-31 DIAGNOSIS — Z0000 Encounter for general adult medical examination without abnormal findings: Secondary | ICD-10-CM

## 2013-08-31 DIAGNOSIS — N400 Enlarged prostate without lower urinary tract symptoms: Secondary | ICD-10-CM

## 2013-08-31 LAB — POCT URINALYSIS DIPSTICK
Bilirubin, UA: NEGATIVE
Glucose, UA: NEGATIVE
Ketones, UA: NEGATIVE
Leukocytes, UA: NEGATIVE
Nitrite, UA: NEGATIVE
Protein, UA: NEGATIVE
Spec Grav, UA: <=1.005
Urobilinogen, UA: 0.2
pH, UA: 6

## 2013-08-31 LAB — CBC WITH DIFFERENTIAL/PLATELET
Basophils Absolute: 0 10*3/uL (ref 0.0–0.1)
Basophils Relative: 0 % (ref 0–1)
Eosinophils Absolute: 0.1 10*3/uL (ref 0.0–0.7)
Eosinophils Relative: 1 % (ref 0–5)
HCT: 44.3 % (ref 39.0–52.0)
Hemoglobin: 15.3 g/dL (ref 13.0–17.0)
Lymphocytes Relative: 33 % (ref 12–46)
Lymphs Abs: 1.9 10*3/uL (ref 0.7–4.0)
MCH: 30.8 pg (ref 26.0–34.0)
MCHC: 34.5 g/dL (ref 30.0–36.0)
MCV: 89.1 fL (ref 78.0–100.0)
Monocytes Absolute: 0.3 10*3/uL (ref 0.1–1.0)
Monocytes Relative: 6 % (ref 3–12)
Neutro Abs: 3.5 10*3/uL (ref 1.7–7.7)
Neutrophils Relative %: 60 % (ref 43–77)
Platelets: 287 10*3/uL (ref 150–400)
RBC: 4.97 MIL/uL (ref 4.22–5.81)
RDW: 12.8 % (ref 11.5–15.5)
WBC: 5.8 10*3/uL (ref 4.0–10.5)

## 2013-08-31 LAB — COMPLETE METABOLIC PANEL WITH GFR
ALT: 15 U/L (ref 0–53)
AST: 21 U/L (ref 0–37)
Albumin: 4.6 g/dL (ref 3.5–5.2)
Alkaline Phosphatase: 42 U/L (ref 39–117)
BUN: 12 mg/dL (ref 6–23)
CO2: 25 mEq/L (ref 19–32)
Calcium: 9.7 mg/dL (ref 8.4–10.5)
Chloride: 104 mEq/L (ref 96–112)
Creat: 1.03 mg/dL (ref 0.50–1.35)
GFR, Est African American: >89 mL/min
GFR, Est Non African American: 83 mL/min
Glucose, Bld: 86 mg/dL (ref 70–99)
Potassium: 4.2 mEq/L (ref 3.5–5.3)
Sodium: 137 mEq/L (ref 135–145)
Total Bilirubin: 0.9 mg/dL (ref 0.2–1.2)
Total Protein: 7.4 g/dL (ref 6.0–8.3)

## 2013-08-31 LAB — PSA, MEDICARE: PSA: 0.83 ng/mL (ref <=4.00)

## 2013-08-31 LAB — LIPID PANEL
Cholesterol: 231 mg/dL — ABNORMAL HIGH (ref 0–200)
HDL: 66 mg/dL (ref >39)
LDL Cholesterol: 146 mg/dL — ABNORMAL HIGH (ref 0–99)
Total CHOL/HDL Ratio: 3.5 Ratio
Triglycerides: 94 mg/dL (ref <150)
VLDL: 19 mg/dL (ref 0–40)

## 2013-08-31 MED ORDER — CLONAZEPAM 1 MG PO TABS: 0.5000 mg | ORAL_TABLET | Freq: Every day | ORAL | Status: DC

## 2013-08-31 NOTE — Progress Notes (Signed)
   Subjective:    Patient ID: William [name], male    DOB: [birth year], 53 y.o.   MRN: [id number]  HPI    Review of Systems  Constitutional: Positive for diaphoresis.       At night  Eyes: Negative.   Respiratory: Negative.   Cardiovascular: Negative.   Gastrointestinal: Positive for nausea, diarrhea and abdominal distention.  Endocrine: Negative.   Genitourinary: Negative.   Musculoskeletal: Negative.   Skin: Negative.   Allergic/Immunologic: Negative.   Neurological: Positive for headaches.  Hematological: Negative.   Psychiatric/Behavioral: Negative.        Objective:   Physical Exam        Assessment & Plan:

## 2013-08-31 NOTE — Patient Instructions (Signed)

## 2013-08-31 NOTE — Progress Notes (Signed)
Patient ID: William [name], male   DOB: [birth year], 53 y.o.   MRN: [id number]   This chart was scribed for [name], MD by Marcha Dutton, ED Scribe. This patient was seen in room 28.  Patient ID: William [name] MRN: [id number], DOB: 10/15/1960 53 y.o. Date of Encounter: 08/31/2013, 9:04 AM  Primary Physician: Kennon Portela, MD  Chief Complaint: Physical (CPE)  HPI: 53 y.o. y/o male with history noted below here for CPE.   Pt reports he's trying to reduce his klonopin dose. He's tapering it down slowly over the course of the next few months. Pt is concerned abou this health as he reduces from .5 mg. He states he'd like to reduce to .25 mg over the course of the next 6 months. He reports he's been on klonopin for almost 20 years. Pt states he's glad he didn't go on botox treatment for his headaches. He states he's glad also to be off the tylox for his headaches. He states he was on it since the early 2000s.  Doing well. He states he's been trying to get more exercise lately and watch his diet. He recently began biking and would like to take this up with his wife. Pt reports his wife is doing well. She teaches kindergarten and K-1.  Pt states he saw a provider at [facility] for his dry skin and a bunion on his right foot.   No issues/complaints.  Review of Systems: Positive for anxiety Consitutional: No fever, chills, fatigue, night sweats, lymphadenopathy, or weight changes. Eyes: No visual changes, eye redness, or discharge. ENT/Mouth: Ears: No otalgia, tinnitus, hearing loss, discharge. Nose: No congestion, rhinorrhea, sinus pain, or epistaxis. Throat: No sore throat, post nasal drip, or teeth pain. Cardiovascular: No CP, palpitations, diaphoresis, DOE, edema, orthopnea, PND. Respiratory: No cough, hemoptysis, SOB, or wheezing. Gastrointestinal: No anorexia, dysphagia, reflux, pain, nausea, vomiting, hematemesis, diarrhea, constipation, BRBPR, or  melena. Genitourinary: No dysuria, frequency, urgency, hematuria, incontinence, nocturia, decreased urinary stream, discharge, impotence, or testicular pain/masses. Musculoskeletal: No decreased ROM, myalgias, stiffness, joint swelling, or weakness. Right foot bunion. Skin: No rash, erythema, lesion changes, pain, warmth, jaundice, or pruritis. Neurological: No headache, dizziness, syncope, seizures, tremors, memory loss, coordination problems, or paresthesias. Psychological: No depression, hallucinations, SI/HI. Endocrine: No fatigue, polydipsia, polyphagia, polyuria, or known diabetes. All other systems were reviewed and are otherwise negative.  Past Medical History  Diagnosis Date   Allergy    Depression    Migraine headache    Anxiety    Mitral valve prolapse    Heart murmur      No past surgical history on file.  Home Meds:  Prior to Admission medications   Medication Sig Start Date End Date Taking? Authorizing Provider  clonazePAM (KLONOPIN) 1 MG tablet Take 0.5 tablets (0.5 mg total) by mouth daily as needed for anxiety. 06/28/13  Yes [name], MD  ipratropium (ATROVENT) 0.03 % nasal spray Place 2 sprays into the nose 2 (two) times daily. 07/16/13  Yes [name], MD  lidocaine (LIDODERM) 5 % Place 1 patch onto the skin daily. Remove & Discard patch within 12 hours or as directed by MD 06/01/13  Yes [name], MD  metoCLOPramide (REGLAN) 5 MG tablet Take 5 mg by mouth every 6 (six) hours as needed for nausea.   Yes Historical Provider, MD  mirtazapine (REMERON) 30 MG tablet Take 1 tablet (30 mg total) by mouth at bedtime. 06/01/13  Yes [name], MD  Naproxen  Sodium (ALEVE PO) Take by mouth.   Yes Historical Provider, MD  olopatadine (PATANOL) 0.1 % ophthalmic solution Place 1 drop into both eyes 2 (two) times daily. 07/20/13  Yes Darlyne Russian, MD  clonazePAM (KLONOPIN) 0.25 MG disintegrating tablet Take one tablet daily 08/12/13   Darlyne Russian, MD     Allergies:  Allergies  Allergen Reactions   Cephalexin Diarrhea and Nausea And Vomiting   Cephalosporins Diarrhea and Nausea And Vomiting   Compazine [Prochlorperazine Edisylate]     uncontrollable shake - tremor   Erythromycin Nausea And Vomiting   Ketorolac Tromethamine     REACTION: Reaction not known   Latex     REACTION: Reaction not known   Nalbuphine     REACTION: Reaction not known   Prednisone Nausea And Vomiting   Prochlorperazine Edisylate     REACTION: shakes/tremors   Topamax [Topiramate] Diarrhea and Nausea And Vomiting   Cefdinir Diarrhea, Nausea And Vomiting and Rash   Neosporin [Neomycin-Bacitracin Zn-Polymyx] Rash    History   Social History   Marital Status: Married    Spouse Name: N/A    Number of Children: N/A   Years of Education: N/A   Occupational History   Not on file.   Social History Main Topics   Smoking status: Never Smoker    Smokeless tobacco: Not on file   Alcohol Use: No   Drug Use: No   Sexual Activity: Yes   Other Topics Concern   Not on file   Social History Narrative   No narrative on file    Family History  Problem Relation Age of Onset   Hypertension Mother    Hypertension Brother     Physical Exam: Blood pressure 110/70, pulse 86, temperature 97.9 F (36.6 C), temperature source Oral, resp. rate 16, height 5\' 10"  (1.778 m), weight 134 lb 12.8 oz (61.145 kg), SpO2 99.00%.  General: Well developed, well nourished, in no acute distress. HEENT: Normocephalic, atraumatic. Conjunctiva pink, sclera non-icteric. Pupils 2 mm constricting to 1 mm, round, regular, and equally reactive to light and accomodation. EOMI. Internal auditory canal clear. TMs with good cone of light and without pathology. Nasal mucosa pink. Nares are without discharge. No sinus tenderness. Oral mucosa pink. Dentition normal. Pharynx without exudate.   Neck: Supple. Trachea midline. No thyromegaly. Full ROM. No  lymphadenopathy. Lungs: Clear to auscultation bilaterally without wheezes, rales, or rhonchi. Breathing is of normal effort and unlabored. Cardiovascular: RRR with S1 S2. No murmurs, rubs, or gallops appreciated. Distal pulses 2+ symmetrically. No carotid or abdominal bruits Abdomen: Soft, non-tender, non-distended with normoactive bowel sounds. No hepatosplenomegaly or masses. No rebound/guarding. No CVA tenderness. Without hernias.  Rectal: No external hemorrhoids or fissures. Rectal vault without masses. There is a slight increase in the size of his prostate which is consistent with his age Genitourinary: circumcised male. No penile lesions. Testes descended bilaterally, and smooth without tenderness or masses.  Musculoskeletal: Full range of motion and 5/5 strength throughout. Without swelling, atrophy, tenderness, crepitus, or warmth. Extremities without clubbing, cyanosis, or edema. Calves supple. The "bunion" on his right foot is small and noninflamed and he has full range of motion of his great toe appear Skin: Warm and moist without erythema, ecchymosis, wounds, or rash. Neuro: A+Ox3. CN II-XII grossly intact. Moves all extremities spontaneously. Full sensation throughout. Normal gait. DTR 2+ throughout upper and lower extremities. Finger to nose intact. Psych:  Responds to questions appropriately with a normal affect.    Assessment/Plan:  Anxiety state, unspecified - Plan: clonazePAM (KLONOPIN) 1 MG tablet, COMPLETE METABOLIC PANEL WITH GFR  Migraine, unspecified, without mention of intractable migraine without mention of status migrainosus - Plan: Lipid panel, CBC with Differential, COMPLETE METABOLIC PANEL WITH GFR  BPH (benign prostatic hyperplasia) - Plan: POCT urinalysis dipstick, PSA, Medicare  Patient will need a very slow taper on the clonazepam. I'm very pleased that he should has been able to discontinue his Tylox for the last year. He's beginning an exercise program now  riding a bike with his wife. He seems adequate relationship with her and seems to be relatively satisfied with his life.  He continues to be very anxious about what will happen to him regarding his headaches and his medications. I tried to reassure him that we were to stay the course for now, see him relatively frequently (every month) and continue to support him until he is able to get off all of his medicine.  Accident the patient's in pretty good shape.   Signed, [name], MD 08/31/2013 9:04 AM

## 2013-09-24 ENCOUNTER — Ambulatory Visit: Payer: Self-pay | Admitting: Neurology

## 2013-10-01 ENCOUNTER — Encounter: Payer: Self-pay | Admitting: Family Medicine

## 2013-10-01 ENCOUNTER — Ambulatory Visit (INDEPENDENT_AMBULATORY_CARE_PROVIDER_SITE_OTHER): Payer: Medicare Other | Admitting: Family Medicine

## 2013-10-01 VITALS — BP 122/70 | HR 94 | Temp 98.0°F | Resp 16 | Ht 71.0 in | Wt 136.6 lb

## 2013-10-01 DIAGNOSIS — R197 Diarrhea, unspecified: Secondary | ICD-10-CM

## 2013-10-01 DIAGNOSIS — G43909 Migraine, unspecified, not intractable, without status migrainosus: Secondary | ICD-10-CM

## 2013-10-01 DIAGNOSIS — R002 Palpitations: Secondary | ICD-10-CM

## 2013-10-01 DIAGNOSIS — J329 Chronic sinusitis, unspecified: Secondary | ICD-10-CM

## 2013-10-01 DIAGNOSIS — F411 Generalized anxiety disorder: Secondary | ICD-10-CM

## 2013-10-01 MED ORDER — CLONAZEPAM 1 MG PO TABS: 0.5000 mg | ORAL_TABLET | Freq: Every day | ORAL | Status: DC

## 2013-10-01 MED ORDER — AZITHROMYCIN 250 MG PO TABS: ORAL_TABLET | ORAL | Status: DC

## 2013-10-01 NOTE — Progress Notes (Signed)
53 yo with chronic migraines who has been tapering his clonazepam.  Over the past week he has lost his appetite, developed global headache.  He notes sinus congestion.  The headache is associated with photophonia and photophobia.  He also had an episode of several days of diarrhea.  He is still doing the acupuncture, but he has reduced the frequency of acupuncture to q 2 weeks.  He is still exercising.  He thinks he doesn't breathe properly during exercise.  He has also developed some palpitations.  They come and go during the day.  His cardiologist saw him 1 month ago.  He is now off oxycodone x 1 year.   Objective:  alert Wt Readings from Last 3 Encounters:  10/01/13 136 lb 9.6 oz (61.961 kg)  08/31/13 134 lb 12.8 oz (61.145 kg)  08/25/13 140 lb (63.504 kg)  alert, articulate.  Seen with wife. HEENT:  Narrowed sinus passages, TM"s okay, normal oroph,  Neck:  Supple, no adenopathy Chest:  Clear Heart:  Reg, no murmur, no rub or gallop Skin:  Clear  Assessment:  Chronic headaches with recent worsening of different character.  The diarrhea seems to have passed.  Migraine  Sinusitis - Plan: azithromycin (ZITHROMAX Z-PAK) 250 MG tablet  Diarrhea  Palpitations  Anxiety state, unspecified - Plan: clonazePAM (KLONOPIN) 1 MG tablet Avoid any further taper of the clonazepam for now.  Recheck 6 weeks.   Signed, [name], MD

## 2013-10-15 ENCOUNTER — Ambulatory Visit (INDEPENDENT_AMBULATORY_CARE_PROVIDER_SITE_OTHER): Payer: Medicare Other

## 2013-10-15 ENCOUNTER — Encounter: Payer: Self-pay | Admitting: *Deleted

## 2013-10-15 ENCOUNTER — Encounter: Payer: Self-pay | Admitting: Family Medicine

## 2013-10-15 ENCOUNTER — Ambulatory Visit (INDEPENDENT_AMBULATORY_CARE_PROVIDER_SITE_OTHER): Payer: Medicare Other | Admitting: Family Medicine

## 2013-10-15 VITALS — BP 112/64 | HR 78 | Temp 98.3°F | Resp 16 | Ht 70.0 in | Wt 136.2 lb

## 2013-10-15 DIAGNOSIS — J329 Chronic sinusitis, unspecified: Secondary | ICD-10-CM

## 2013-10-15 DIAGNOSIS — R51 Headache: Secondary | ICD-10-CM

## 2013-10-15 MED ORDER — FLUTICASONE PROPIONATE 50 MCG/ACT NA SUSP: 2.0000 | Freq: Every day | NASAL | Status: DC

## 2013-10-15 MED ORDER — LEVOFLOXACIN 500 MG PO TABS: 500.0000 mg | ORAL_TABLET | Freq: Every day | ORAL | Status: DC

## 2013-10-15 NOTE — Progress Notes (Signed)
This chart was scribed for [name], MD by Roxan Diesel, Scribe.  This patient was seen in [facility] and the patient's care was started at 9:07 AM.  Patient ID: William [name] MRN: [id number], DOB: 10/31/1960, 53 y.o. Date of Encounter: 10/15/2013, 9:07 AM  Primary Physician: Kennon Portela, MD  Chief Complaint: F/u on headaches  HPI: 53 y.o. year old male with history of chronic migraines, anxiety and depression who is disabled due to his headaches presents for a f/u appointment on his headaches.  He was told to exercise more over the last 2 weeks because his headaches were worsening at his last appointment on 6/4.  His headaches have responded well to exercise in the past.  He has not been exercising since his last appointment.  He was also placed on a Z-Pak for a likely sinus infection 2 weeks ago, which he states improved his symptoms initially but his symptoms have recurred since then.  He states he is having sinus pressure and is blowing his nose productive of clear mucus.  He states he has been fatigued and had no appetite due to his discomfort.  He denies hearing loss, dizziness, visual changes, or dental pain.  He also reports a global headache which is distinct from his typical retrobulbar posterior migraines.    He also reports diarrhea recently which has since resolved.   Past Medical History  Diagnosis Date   Allergy    Depression    Migraine headache    Anxiety    Mitral valve prolapse    Heart murmur      Home Meds: Prior to Admission medications   Medication Sig Start Date End Date Taking? Authorizing Provider  azithromycin (ZITHROMAX Z-PAK) 250 MG tablet Take as directed on pack 10/01/13   [name], MD  clonazePAM (KLONOPIN) 1 MG tablet Take 0.5 tablets (0.5 mg total) by mouth daily. 10/01/13   [name], MD  ipratropium (ATROVENT) 0.03 % nasal spray Place 2 sprays into the nose 2 (two) times daily. 07/16/13   [name],  MD  lidocaine (LIDODERM) 5 % Place 1 patch onto the skin daily. Remove & Discard patch within 12 hours or as directed by MD 06/01/13   [name], MD  metoCLOPramide (REGLAN) 5 MG tablet Take 5 mg by mouth every 6 (six) hours as needed for nausea.    Historical Provider, MD  mirtazapine (REMERON) 30 MG tablet Take 1 tablet (30 mg total) by mouth at bedtime. 06/01/13   [name], MD  Naproxen Sodium (ALEVE PO) Take by mouth.    Historical Provider, MD  olopatadine (PATANOL) 0.1 % ophthalmic solution Place 1 drop into both eyes 2 (two) times daily. 07/20/13   Darlyne Russian, MD    Allergies:  Allergies  Allergen Reactions   Cephalexin Diarrhea and Nausea And Vomiting   Cephalosporins Diarrhea and Nausea And Vomiting   Compazine [Prochlorperazine Edisylate]     uncontrollable shake - tremor   Erythromycin Nausea And Vomiting   Ketorolac Tromethamine     REACTION: Reaction not known   Latex     REACTION: Reaction not known   Nalbuphine     REACTION: Reaction not known   Prednisone Nausea And Vomiting   Prochlorperazine Edisylate     REACTION: shakes/tremors   Topamax [Topiramate] Diarrhea and Nausea And Vomiting   Cefdinir Diarrhea, Nausea And Vomiting and Rash   Neosporin [Neomycin-Bacitracin Zn-Polymyx] Rash    History   Social History   Marital  Status: Married    Spouse Name: N/A    Number of Children: N/A   Years of Education: N/A   Occupational History   Not on file.   Social History Main Topics   Smoking status: Never Smoker    Smokeless tobacco: Not on file   Alcohol Use: No   Drug Use: No   Sexual Activity: Yes   Other Topics Concern   Not on file   Social History Narrative   No narrative on file     Review of Systems: Constitutional: positive for fatigue and low appetite.  negative for chills, fever, night sweats, or weight changes. HEENT: positive for sinus pressure.  negative for vision changes, hearing loss, ST, or  epistaxis. Cardiovascular: negative for chest pain or palpitations Respiratory: negative for hemoptysis, wheezing, shortness of breath, or cough Abdominal: positive for diarrhea. negative for abdominal pain, nausea, vomiting, or constipation Dermatological: negative for rash Neurologic: positive for headache. Negative for dizziness, or syncope All other systems reviewed and are otherwise negative with the exception to those above and in the HPI.   Physical Exam: Blood pressure 112/64, pulse 78, temperature 98.3 F (36.8 C), temperature source Oral, resp. rate 16, height 5\' 10"  (1.778 m), weight 136 lb 3.2 oz (61.78 kg), SpO2 96.00%., Body mass index is 19.54 kg/(m^2). General: Well developed, well nourished, in no acute distress. Head: Normocephalic, atraumatic, eyes without discharge, sclera non-icteric, nares are without discharge.  Swelling in nasal passages, pale blue turbinates. Bilateral auditory canals clear, TM's are without perforation, pearly grey and translucent with reflective cone of light bilaterally. Oral cavity moist, posterior pharynx without exudate, erythema, peritonsillar abscess, or post nasal drip.  Neck: Supple. No thyromegaly. Full ROM. No lymphadenopathy. Lungs: Clear bilaterally to auscultation without wheezes, rales, or rhonchi. Breathing is unlabored. Heart: RRR with S1 S2. No murmurs, rubs, or gallops appreciated. Abdomen: Soft, non-tender, non-distended with normoactive bowel sounds. No hepatomegaly. No rebound/guarding. No obvious abdominal masses. Msk:  Strength and tone normal for age. Extremities/Skin: Warm and dry. No clubbing or cyanosis. No edema. No rashes or suspicious lesions. Neuro: Alert and oriented X 3. Moves all extremities spontaneously. Gait is normal. CNII-XII grossly in tact. Psych:  Responds to questions appropriately with a normal affect.   [facility] reading (PRIMARY) by  Dr. Joseph Art:  Sinus film:  Slight thickening left maxillary floor.  No air  fluid levels or bony abnormality..   ASSESSMENT AND PLAN:  53 y.o. year old male with Headache(784.0) - Plan: DG SinUS 1-2 Views  Sinusitis - Plan: levofloxacin (LEVAQUIN) 500 MG tablet, fluticasone (FLONASE) 50 MCG/ACT nasal spray   Signed, [name], MD 10/15/2013 9:07 AM

## 2013-10-20 ENCOUNTER — Ambulatory Visit: Payer: Self-pay | Admitting: Neurology

## 2013-10-22 ENCOUNTER — Encounter: Payer: Self-pay | Admitting: Family Medicine

## 2013-10-22 ENCOUNTER — Ambulatory Visit (INDEPENDENT_AMBULATORY_CARE_PROVIDER_SITE_OTHER): Payer: Medicare Other | Admitting: Family Medicine

## 2013-10-22 VITALS — BP 98/64 | HR 89 | Temp 98.4°F | Resp 16 | Ht 70.0 in | Wt 137.2 lb

## 2013-10-22 DIAGNOSIS — E559 Vitamin D deficiency, unspecified: Secondary | ICD-10-CM

## 2013-10-22 DIAGNOSIS — J309 Allergic rhinitis, unspecified: Secondary | ICD-10-CM

## 2013-10-22 DIAGNOSIS — J302 Other seasonal allergic rhinitis: Secondary | ICD-10-CM

## 2013-10-22 DIAGNOSIS — R51 Headache: Secondary | ICD-10-CM

## 2013-10-22 LAB — CBC WITH DIFFERENTIAL/PLATELET
Basophils Absolute: 0 10*3/uL (ref 0.0–0.1)
Basophils Relative: 0 % (ref 0–1)
Eosinophils Absolute: 0.1 10*3/uL (ref 0.0–0.7)
Eosinophils Relative: 1 % (ref 0–5)
HCT: 44.6 % (ref 39.0–52.0)
Hemoglobin: 15.5 g/dL (ref 13.0–17.0)
Lymphocytes Relative: 28 % (ref 12–46)
Lymphs Abs: 1.8 10*3/uL (ref 0.7–4.0)
MCH: 31.1 pg (ref 26.0–34.0)
MCHC: 34.8 g/dL (ref 30.0–36.0)
MCV: 89.4 fL (ref 78.0–100.0)
Monocytes Absolute: 0.4 10*3/uL (ref 0.1–1.0)
Monocytes Relative: 6 % (ref 3–12)
Neutro Abs: 4.1 10*3/uL (ref 1.7–7.7)
Neutrophils Relative %: 65 % (ref 43–77)
Platelets: 251 10*3/uL (ref 150–400)
RBC: 4.99 MIL/uL (ref 4.22–5.81)
RDW: 13 % (ref 11.5–15.5)
WBC: 6.3 10*3/uL (ref 4.0–10.5)

## 2013-10-22 LAB — COMPLETE METABOLIC PANEL WITH GFR
ALT: 11 U/L (ref 0–53)
AST: 19 U/L (ref 0–37)
Albumin: 4.5 g/dL (ref 3.5–5.2)
Alkaline Phosphatase: 47 U/L (ref 39–117)
BUN: 10 mg/dL (ref 6–23)
CO2: 25 mEq/L (ref 19–32)
Calcium: 9.7 mg/dL (ref 8.4–10.5)
Chloride: 104 mEq/L (ref 96–112)
Creat: 1.14 mg/dL (ref 0.50–1.35)
GFR, Est African American: 84 mL/min
GFR, Est Non African American: 73 mL/min
Glucose, Bld: 85 mg/dL (ref 70–99)
Potassium: 4.2 mEq/L (ref 3.5–5.3)
Sodium: 137 mEq/L (ref 135–145)
Total Bilirubin: 0.7 mg/dL (ref 0.2–1.2)
Total Protein: 7.5 g/dL (ref 6.0–8.3)

## 2013-10-22 LAB — SEDIMENTATION RATE: Sed Rate: 1 mm/hr (ref 0–16)

## 2013-10-22 NOTE — Progress Notes (Signed)
Subjective:    Patient ID: William [name], male    DOB: 1960-06-29, 53 y.o.   MRN: [id number]  HPI Scribed for [name] MD, the patient was seen in room 26. This chart was scribed by Denice Bors, ED scribe. Patient's care was started at 9:21 AM  HPI Comments: Hx was provided by the pt and review of medical records. William [name] is a 53 y.o. male who presents to the [facility] with a PMHx of seasonal allergies complaining of persistent headache onset 1 month. Describes headache as diffuse pressure sensation and moderate in severity. Reports associated sinus congestion. Denies any aggravating factors. States headache is alleviated mildly when lying down on supine position. Reports trying acupuncture with moderate relief of symptoms 3 weeks ago. Reports he is still taking antibiotics for possible sinus infection with no relief of symptoms. Denies associated fever. Reports he has not had hydrocodone for past year.   Additionally, reports diarrhea onset. Reports associated nausea. Reports trying phenergan with no relief of symptoms. Denies any aggravating or alleviating factors. Denies associated fever, and emesis. Denies following up with neurology.   Pt is requesting an ENT and neurology referral.  Wife wants to visit mother next week in [place] and doesn't feel she can leave with [name] feeling like this  Past Medical History  Diagnosis Date   Allergy    Depression    Migraine headache    Anxiety    Mitral valve prolapse    Heart murmur     No past surgical history on file.  Family History  Problem Relation Age of Onset   Hypertension Mother    Hypertension Brother     History   Social History   Marital Status: Married    Spouse Name: N/A    Number of Children: N/A   Years of Education: N/A   Occupational History   Not on file.   Social History Main Topics   Smoking status: Never Smoker    Smokeless tobacco:  Not on file   Alcohol Use: No   Drug Use: No   Sexual Activity: Yes   Other Topics Concern   Not on file   Social History Narrative   No narrative on file    Allergies  Allergen Reactions   Cephalexin Diarrhea and Nausea And Vomiting   Cephalosporins Diarrhea and Nausea And Vomiting   Compazine [Prochlorperazine Edisylate]     uncontrollable shake - tremor   Erythromycin Nausea And Vomiting   Ketorolac Tromethamine     REACTION: Reaction not known   Latex     REACTION: Reaction not known   Nalbuphine     REACTION: Reaction not known   Prednisone Nausea And Vomiting   Prochlorperazine Edisylate     REACTION: shakes/tremors   Topamax [Topiramate] Diarrhea and Nausea And Vomiting   Cefdinir Diarrhea, Nausea And Vomiting and Rash   Neosporin [Neomycin-Bacitracin Zn-Polymyx] Rash    Patient Active Problem List   Diagnosis Date Noted   Hyperlipidemia 04/04/2012   MIGRAINE HEADACHE 07/15/2009   MITRAL VALVE PROLAPSE 07/15/2009   IRRITABLE BOWEL SYNDROME 07/15/2009   PALPITATIONS 06/28/2009   CHEST PAIN 06/28/2009    Filed Vitals:   10/22/13 0913  BP: 98/64  Pulse: 89  Temp: 98.4 F (36.9 C)  TempSrc: Oral  Resp: 16  Height: 5\' 10"  (1.778 m)  Weight: 137 lb 3.2 oz (62.234 kg)  SpO2: 97%         Review of  Systems  Constitutional: Negative for fever.  HENT: Positive for congestion and sinus pressure.   Gastrointestinal: Positive for nausea and diarrhea.  Allergic/Immunologic: Positive for environmental allergies.  Neurological: Positive for headaches.       Objective:   Physical Exam  Nursing note and vitals reviewed. Constitutional: He is oriented to person, place, and time. He appears well-developed and well-nourished. No distress.  HENT:  Head: Normocephalic and atraumatic.  Mild swelling of nasal passages with no mucopurulent discharge, oroph unremarkable, TM's normal  Eyes: Conjunctivae and EOM are normal. Pupils are  equal, round, and reactive to light.  Neck: Neck supple.  Cardiovascular: Normal rate.   Pulmonary/Chest: Effort normal. No respiratory distress.  Musculoskeletal: Normal range of motion.  Neurological: He is alert and oriented to person, place, and time.  Skin: Skin is warm and dry.  Psychiatric: He has a normal mood and affect. His behavior is normal.        Assessment & Plan:  Persistent headache of one month.  This does not have characteristics of migraine.  I have done everything I can think of to solve this problem.   I have asked [name] to schedule appt with Dr. Sima Matas in [place], who he has seen in the past. I will refer to ENT here I will repeat the vitamin D analysis to see if this problem persists.  Unspecified vitamin D deficiency - Plan: Vit D  25 hydroxy (rtn osteoporosis monitoring), CBC with Differential, Sedimentation Rate  Headache(784.0) - Plan: COMPLETE METABOLIC PANEL WITH GFR, Sedimentation Rate, Ambulatory referral to ENT  Seasonal allergies - Plan: CBC with Differential, Sedimentation Rate  Signed, [name], MD

## 2013-10-23 ENCOUNTER — Encounter: Payer: Self-pay | Admitting: Family Medicine

## 2013-10-23 LAB — VITAMIN D 25 HYDROXY (VIT D DEFICIENCY, FRACTURES): Vit D, 25-Hydroxy: 21 ng/mL — ABNORMAL LOW (ref 30–89)

## 2013-12-04 ENCOUNTER — Other Ambulatory Visit: Payer: Self-pay | Admitting: Family Medicine

## 2013-12-15 ENCOUNTER — Ambulatory Visit (INDEPENDENT_AMBULATORY_CARE_PROVIDER_SITE_OTHER): Payer: Medicare Other | Admitting: Neurology

## 2013-12-15 ENCOUNTER — Encounter: Payer: Self-pay | Admitting: Neurology

## 2013-12-15 VITALS — BP 80/78 | HR 98 | Temp 97.8°F | Resp 16 | Ht 70.0 in | Wt 138.3 lb

## 2013-12-15 DIAGNOSIS — G43719 Chronic migraine without aura, intractable, without status migrainosus: Secondary | ICD-10-CM | POA: Insufficient documentation

## 2013-12-15 NOTE — Progress Notes (Signed)
NEUROLOGY CONSULTATION NOTE  William [name] MRN: [id number] DOB: Sep 11, 1960  Referring provider: Lou Miner, MD Primary care provider: [name], MD  Reason for consult:  Chronic intractable migraines  HISTORY OF PRESENT ILLNESS: William [name] is a 53 year old right-handed man with hyperlipidemia, MVP, IBS, gastroparesis, generalized anxiety disorder, and palpitations who presents for headache.  Records were personally reviewed.  Onset:  1986 Location: 90% of time: Left retro-orbital region, 10% bi-frontal Quality:  stabbing Intensity:  Fluctuates from 4/10 to 10/10 Aura:  no Prodrome:  no Associated symptoms:  Lightheadedness, fatigue, nausea, tearing of left eye. Duration:  Constant but intensity fluctuates Frequency:  constant Triggers/exacerbating factors:  Afran, odors (perfumes, cigarette, chinese food), MSG, salt, stress, extreme temperatures Relieving factors:  water Activity:  About 4 to 5 days a week, cannot function  Past abortive therapy:  indomethacin, nasal O2, DHE protocol, temazepam, hydrocodone, morphine, Zofran, Ultram, Ergomar, Nucynta, nalbuphine, lidocaine patches, Tylox, clonazepam, Phenergan, bupivacaine injection, Migranol, Lidocaine nasal drops, Imitrex po/IM, Maxalt, Relpax, Amerge, Zomig Past preventative therapy:  Topamax (effective but weight loss, diarrhea), verapamil, Depakote, Inderal, metoprolol, Keppra, amitriptyline, nortriptyline, Lyrica, gabapentin, Wellbutrin, Remeron, venlafaxine, CoQ10, petasites, biofeedback, counseling.   Many physicians recommended Botox but he has been hesitant to start it.  Current abortive therapy:  Aleve or Tylenol two days a week (not effective) Current preventative therapy:  Some acupuncture (helpful but not resolves)  Caffeine:  no Alcohol:  no Smoker:  no Diet:  Bland diet, low salt, no caffeine Exercise:  No.  Heavy exercise aggravates.  He previously took walks around the block, but the  temperature aggravated the headache Depression/stress:  Yes.  Depressed that he cannot work.  He is on permanent disability Sleep hygiene:  Poor.  Family history of headache:  no  He has had multiple imaging of the head, all unremarkable, including:   CT HEAD from 06/01/09 and 07/30/09. MRI BRAIN W/WO from 12/08/02, 01/19/05, 04/18/12  CERVICAL XR from 01/15/08 showed mild degenerative disc disease at C5-6.  He has seen multiple neurologists and headache and pain specialists, including Dr. Morene Antu, Dr. Asencion Partridge Dohmeieir, Dr. Earley Favor, Dr. Melton Alar, Dr. Rock Nephew at [facility] and he was seen at the [facility].    He also has history of some obsessive compulsive symptoms since he was young.  For example, he repeatedly washes his hands and always uses a new cup during the day when he drinks something.  He has history of low blood pressure and has been evaluated by cardiology for palpitations.  Work up was unremarkable.  PAST MEDICAL HISTORY: Past Medical History  Diagnosis Date  . Allergy   . Depression   . Migraine headache   . Anxiety   . Mitral valve prolapse   . Heart murmur     PAST SURGICAL HISTORY: No past surgical history on file.  MEDICATIONS: Current Outpatient Prescriptions on File Prior to Visit  Medication Sig Dispense Refill  . clonazePAM (KLONOPIN) 1 MG tablet Take 0.5 tablets (0.5 mg total) by mouth daily.  60 tablet  1  . fluticasone (FLONASE) 50 MCG/ACT nasal spray Place 2 sprays into both nostrils daily.  16 g  6  . ipratropium (ATROVENT) 0.03 % nasal spray Place 2 sprays into the nose 2 (two) times daily.  30 mL  5  . lidocaine (LIDODERM) 5 % Place 1 patch onto the skin daily. Remove & Discard patch within 12 hours or as directed by MD  30 patch  0  .  metoCLOPramide (REGLAN) 5 MG tablet Take 5 mg by mouth every 6 (six) hours as needed for nausea.      . mirtazapine (REMERON) 30 MG tablet TAKE ONE TABLET AT BEDTIME.  90 tablet  0  . Naproxen Sodium (ALEVE PO) Take by  mouth.      . levofloxacin (LEVAQUIN) 500 MG tablet Take 1 tablet (500 mg total) by mouth daily.  7 tablet  0  . olopatadine (PATANOL) 0.1 % ophthalmic solution Place 1 drop into both eyes 2 (two) times daily.  5 mL  11   No current facility-administered medications on file prior to visit.    ALLERGIES: Allergies  Allergen Reactions  . Cephalexin Diarrhea and Nausea And Vomiting  . Cephalosporins Diarrhea and Nausea And Vomiting  . Compazine [Prochlorperazine Edisylate]     uncontrollable shake - tremor  . Erythromycin Nausea And Vomiting  . Ketorolac Tromethamine     REACTION: Reaction not known  . Latex     REACTION: Reaction not known  . Nalbuphine     REACTION: Reaction not known  . Prednisone Nausea And Vomiting  . Prochlorperazine Edisylate     REACTION: shakes/tremors  . Topamax [Topiramate] Diarrhea and Nausea And Vomiting  . Cefdinir Diarrhea, Nausea And Vomiting and Rash  . Neosporin [Neomycin-Bacitracin Zn-Polymyx] Rash    FAMILY HISTORY: Family History  Problem Relation Age of Onset  . Hypertension Mother   . Hypertension Brother     SOCIAL HISTORY: History   Social History  . Marital Status: Married    Spouse Name: N/A    Number of Children: N/A  . Years of Education: N/A   Occupational History  . Not on file.   Social History Main Topics  . Smoking status: Never Smoker   . Smokeless tobacco: Not on file  . Alcohol Use: No  . Drug Use: No  . Sexual Activity: Yes    Partners: Female   Other Topics Concern  . Not on file   Social History Narrative  . No narrative on file    REVIEW OF SYSTEMS: Constitutional: No fevers, chills, or sweats, no generalized fatigue, change in appetite. Eyes: No visual changes, double vision, eye pain Ear, nose and throat: No hearing loss, ear pain, nasal congestion, sore throat Cardiovascular: No chest pain, palpitations at this time. Respiratory:  No shortness of breath at rest or with exertion,  wheezes GastrointestinaI: Occasional nausea, diarrhea Genitourinary:  No dysuria, urinary retention or frequency Musculoskeletal:  No neck pain, back pain Integumentary: No rash, pruritus, skin lesions Neurological: as above Psychiatric: Depression, insomnia, anxiety Endocrine: No palpitations, fatigue, diaphoresis, mood swings, change in appetite, change in weight, increased thirst Hematologic/Lymphatic:  No anemia, purpura, petechiae. Allergic/Immunologic: no itchy/runny eyes, nasal congestion, recent allergic reactions, rashes  PHYSICAL EXAM: Filed Vitals:   12/15/13 0943  BP: 80/78  Pulse: 98  Temp: 97.8 F (36.6 C)  Resp: 16   General: No acute distress Head:  Normocephalic/atraumatic Neck: supple, no paraspinal tenderness, full range of motion Back: No paraspinal tenderness Heart: regular rate and rhythm Lungs: Clear to auscultation bilaterally. Vascular: No carotid bruits. Neurological Exam: Mental status: alert and oriented to person, place, and time, recent and remote memory intact, fund of knowledge intact, attention and concentration intact, speech fluent and not dysarthric, language intact. Cranial nerves: CN I: not tested CN II: pupils equal, round and reactive to light, visual fields intact, fundi unremarkable, without vessel changes, exudates, hemorrhages or papilledema. CN III, IV, VI:  full  range of motion, no nystagmus, no ptosis CN V: facial sensation intact CN VII: upper and lower face symmetric CN VIII: hearing intact CN IX, X: gag intact, uvula midline CN XI: sternocleidomastoid and trapezius muscles intact CN XII: tongue midline Bulk & Tone: normal, no fasciculations. Motor: 5 out of 5 throughout Sensation: Temperature and vibration intact Deep Tendon Reflexes: 2+ throughout, toes downgoing Finger to nose testing: No dysmetria Heel to shin: No dysmetria Gait: Normal station and stride. Able to turn and walk in tandem. Romberg  negative.  IMPRESSION: Chronic intractable migraines without aura  PLAN: At this point, I really do recommend Botox.  He has already exhausted all the medications and supplements that I use.  Lisinopril hasn't been tried, however he has low blood pressure.  Another option would be cognitive behavioral therapy, although I wouldn't be surprised that he had this since he has seen several psychologists and had biofeedback.  He notes some mild improvement in acupuncture, so he can continue this if wanted.  Otherwise, last resort therapy would be opioids.  I provided him information on Botox and he will get back to me about whether he wants to pursue this, CBT or continue acupuncture for now.  I also asked that he discuss with his PCP regarding the low blood pressure.  He has longstanding history of low blood pressure, but it has been lower than usual over the past couple of months and he does feel lightheaded at times.  60 minutes spent with patient and his wife, over 50% spent counseling and discussing treatment options.  Thank you for allowing me to take part in the care of this patient.  Metta Clines, DO  CC:  [name], MD  Lou Miner, MD

## 2013-12-15 NOTE — Patient Instructions (Signed)
At this point, I do recommend Botox.  Other option would include cognitive behavioral therapy or continuing acupuncture.  You have pretty much exhausted the medications used for headache, as well as the herbal supplements.  I will provide you information for Botox.  Discuss with Dr. Joseph Art regarding the low blood pressure.  Call if you would like to pursue Botox.

## 2013-12-29 ENCOUNTER — Encounter: Payer: Self-pay | Admitting: *Deleted

## 2013-12-29 DIAGNOSIS — R197 Diarrhea, unspecified: Secondary | ICD-10-CM

## 2013-12-30 ENCOUNTER — Encounter: Payer: Self-pay | Admitting: *Deleted

## 2014-01-01 ENCOUNTER — Encounter: Payer: Self-pay | Admitting: *Deleted

## 2014-01-01 DIAGNOSIS — Z9289 Personal history of other medical treatment: Secondary | ICD-10-CM | POA: Insufficient documentation

## 2014-01-13 ENCOUNTER — Ambulatory Visit: Payer: Medicare Other | Admitting: Physician Assistant

## 2014-01-18 ENCOUNTER — Ambulatory Visit: Payer: Medicare Other | Admitting: Family Medicine

## 2014-01-29 ENCOUNTER — Other Ambulatory Visit: Payer: Self-pay | Admitting: Internal Medicine

## 2014-01-29 DIAGNOSIS — R4701 Aphasia: Secondary | ICD-10-CM

## 2014-02-04 ENCOUNTER — Ambulatory Visit
Admission: RE | Admit: 2014-02-04 | Discharge: 2014-02-04 | Disposition: A | Discharge status: Home / Self Care | Payer: Medicare Other | Source: Ambulatory Visit | Attending: Internal Medicine | Admitting: Internal Medicine

## 2014-02-04 DIAGNOSIS — R4701 Aphasia: Secondary | ICD-10-CM

## 2014-02-08 ENCOUNTER — Telehealth: Payer: Self-pay

## 2014-02-08 ENCOUNTER — Ambulatory Visit (INDEPENDENT_AMBULATORY_CARE_PROVIDER_SITE_OTHER): Payer: Medicare Other | Admitting: Family Medicine

## 2014-02-08 ENCOUNTER — Encounter: Payer: Self-pay | Admitting: Family Medicine

## 2014-02-08 VITALS — BP 100/60 | HR 85 | Temp 98.3°F | Resp 16 | Ht 70.5 in | Wt 134.6 lb

## 2014-02-08 DIAGNOSIS — M542 Cervicalgia: Secondary | ICD-10-CM

## 2014-02-08 DIAGNOSIS — M25511 Pain in right shoulder: Secondary | ICD-10-CM

## 2014-02-08 DIAGNOSIS — F411 Generalized anxiety disorder: Secondary | ICD-10-CM

## 2014-02-08 MED ORDER — LIDOCAINE 5 % EX PTCH: 1.0000 | MEDICATED_PATCH | CUTANEOUS | Status: DC

## 2014-02-08 MED ORDER — CLONAZEPAM 1 MG PO TABS: 0.5000 mg | ORAL_TABLET | Freq: Every day | ORAL | Status: DC

## 2014-02-08 MED ORDER — MIRTAZAPINE 30 MG PO TABS: ORAL_TABLET | ORAL | Status: DC

## 2014-02-08 NOTE — Telephone Encounter (Signed)
Pt saw Dr. Joseph Art this morning for an appt and was rxed Lidocaine patches. [place] told him that he needs a prior authorization for this medication. Please call [place] @ [phone]

## 2014-02-08 NOTE — Patient Instructions (Addendum)
I want you to take one ibuprofen (400 mg) at suppertime to lessen the inflammation in the biceps tendon. Also continue the Lidoderm patch prescription. If this doesn't work, return and will inject the area with small amount of cortisone.  It looks like you're acupuncture is working for her migraines so let's stick with that for now before considering Botox.  As far as anxiety and insomnia goes, I refilled her prescriptions.

## 2014-02-08 NOTE — Progress Notes (Signed)
This chart was scribed for [name], MD by Ladene Artist, ED Scribe. The patient was seen in room 26. Patient's care was started at 8:43 AM.  Patient ID: William [name] MRN: [id number], DOB: 1960-08-26, 53 y.o. Date of Encounter: 02/08/2014, 8:43 AM  Primary Physician: [name], MD  Chief Complaint  Patient presents with  . Follow up Migrains  . Muscle strain rt arm  . Medication Refill    clonazepam, mirtazapine    HPI: 53 y.o. year old male with history below presents for follow-up for migraine HA. Pt states that he has had 3 migraine HAs within the past 3 weeks that last approximately 30 minutes. Pt states that he has been seeing Dr. Rollene Rotunda for acupuncture every week which improves frequency of migraine HA. Pt states that Dr. Rollene Rotunda has gotten his HAs under control 2-3 weeks ago. Pt last saw Dr. Rollene Rotunda 02/05/14. Pt has noticed HAs are exacerbated with stress, which includes visiting his mother who lives alone in [place]. He plans to visit his mother again 02/10/14-02/13/14. He plans to return in December for 3 months to care for her.   Pt also presents with R upper arm pain that radiates up to shoulder over the past few weeks. He describes the pain as an aching and pulling sensation. Pt reports that he sleeps with his arm over his head. Pt has tried Tylenol without relief. He requests refill of Lidocaine patches for pain.   Pt also requests a medication refill on Klonopin and Remeron. Pt states that he takes .5 mg daily.   Pt is disabled.   Past Medical History  Diagnosis Date  . Allergy   . Depression   . Migraine headache   . Anxiety   . Mitral valve prolapse   . Heart murmur     Home Meds: Prior to Admission medications   Medication Sig Start Date End Date Taking? Authorizing Provider  clonazePAM (KLONOPIN) 1 MG tablet Take 0.5 tablets (0.5 mg total) by mouth daily. 10/01/13   [name], MD  fluticasone (FLONASE) 50 MCG/ACT nasal spray Place 2 sprays into  both nostrils daily. 10/15/13   [name], MD  ipratropium (ATROVENT) 0.03 % nasal spray Place 2 sprays into the nose 2 (two) times daily. 07/16/13   [name], MD  levofloxacin (LEVAQUIN) 500 MG tablet Take 1 tablet (500 mg total) by mouth daily. 10/15/13   [name], MD  lidocaine (LIDODERM) 5 % Place 1 patch onto the skin daily. Remove & Discard patch within 12 hours or as directed by MD 06/01/13   [name], MD  metoCLOPramide (REGLAN) 5 MG tablet Take 5 mg by mouth every 6 (six) hours as needed for nausea.    Historical Provider, MD  mirtazapine (REMERON) 30 MG tablet TAKE ONE TABLET AT BEDTIME.    Heather M Marte, PA-C  Naproxen Sodium (ALEVE PO) Take by mouth.    Historical Provider, MD  olopatadine (PATANOL) 0.1 % ophthalmic solution Place 1 drop into both eyes 2 (two) times daily. 07/20/13   Darlyne Russian, MD   Allergies:  Allergies  Allergen Reactions  . Cephalexin Diarrhea and Nausea And Vomiting  . Cephalosporins Diarrhea and Nausea And Vomiting  . Compazine [Prochlorperazine Edisylate]     uncontrollable shake - tremor  . Erythromycin Nausea And Vomiting  . Ketorolac Tromethamine     REACTION: Reaction not known  . Latex     REACTION: Reaction not known  . Nalbuphine     REACTION: Reaction  not known  . Prednisone Nausea And Vomiting  . Prochlorperazine Edisylate     REACTION: shakes/tremors  . Topamax [Topiramate] Diarrhea and Nausea And Vomiting  . Cefdinir Diarrhea, Nausea And Vomiting and Rash  . Neosporin [Neomycin-Bacitracin Zn-Polymyx] Rash    History   Social History  . Marital Status: Married    Spouse Name: N/A    Number of Children: N/A  . Years of Education: N/A   Occupational History  . Not on file.   Social History Main Topics  . Smoking status: Never Smoker   . Smokeless tobacco: Not on file  . Alcohol Use: No  . Drug Use: No  . Sexual Activity: Yes    Partners: Female   Other Topics Concern  . Not on file   Social  History Narrative  . No narrative on file    Review of Systems: Constitutional: negative for chills, fever, night sweats, weight changes, or fatigue  HEENT: negative for vision changes, hearing loss, congestion, rhinorrhea, ST, epistaxis, or sinus pressure Cardiovascular: negative for chest pain or palpitations Respiratory: negative for hemoptysis, wheezing, shortness of breath, or cough Abdominal: negative for abdominal pain, nausea, vomiting, diarrhea, or constipation Msk: +myalgias Dermatological: negative for rash Neurologic: negative for dizziness, or syncope, +headache All other systems reviewed and are otherwise negative with the exception to those above and in the HPI.  Physical Exam: Triage Vitals: Blood pressure 100/60, pulse 85, temperature 98.3 F (36.8 C), temperature source Oral, resp. rate 16, height 5' 10.5" (1.791 m), weight 134 lb 9.6 oz (61.054 kg), SpO2 98.00%., Body mass index is 19.03 kg/(m^2). General: Well developed, well nourished, in no acute distress. Head: Normocephalic, atraumatic, eyes without discharge, sclera non-icteric, nares are without discharge. Bilateral auditory canals clear, TM's are without perforation, pearly grey and translucent with reflective cone of light bilaterally. Oral cavity moist, posterior pharynx without exudate, erythema, peritonsillar abscess, or post nasal drip.  Neck: Supple. No thyromegaly. Full ROM. No lymphadenopathy. Lungs: Clear bilaterally to auscultation without wheezes, rales, or rhonchi. Breathing is unlabored. Heart: RRR with S1 S2. No murmurs, rubs, or gallops appreciated. Abdomen: Soft, non-tender, non-distended with normoactive bowel sounds. No hepatomegaly. No rebound/guarding. No obvious abdominal masses. Msk:  Strength and tone normal for age. Tenderness over bicipital tuberosity.   Extremities/Skin: Warm and dry. No clubbing or cyanosis. No edema. No rashes or suspicious lesions. Neuro: Alert and oriented X 3. Moves  all extremities spontaneously. Gait is normal. CNII-XII grossly in tact. Psych:  Responds to questions appropriately with a normal affect.   Labs:  ASSESSMENT AND PLAN:  53 y.o. year old male with  1. Neck pain on left side   2. Pain in joint, shoulder region, right   3. Anxiety state    I personally performed the services described in this documentation, which was scribed in my presence. The recorded information has been reviewed and is accurate. Neck pain on left side - Plan: lidocaine (LIDODERM) 5 %  Pain in joint, shoulder region, right - Plan: lidocaine (LIDODERM) 5 %  Anxiety state - Plan: clonazePAM (KLONOPIN) 1 MG tablet, mirtazapine (REMERON) 30 MG tablet  Migraines seem to be better controlled with the acupuncture. They seem to be worse when is under stress so I suggested that instead of going to [place] for 3 months, he bring his mother to stay with him during the winter Signed, [name], MD 02/08/2014 8:43 AM

## 2014-02-15 ENCOUNTER — Encounter: Payer: Self-pay | Admitting: Family Medicine

## 2014-02-15 ENCOUNTER — Ambulatory Visit (INDEPENDENT_AMBULATORY_CARE_PROVIDER_SITE_OTHER): Payer: Medicare Other | Admitting: Family Medicine

## 2014-02-15 VITALS — BP 119/73 | HR 91 | Temp 97.7°F | Resp 16 | Ht 71.0 in | Wt 135.0 lb

## 2014-02-15 DIAGNOSIS — L853 Xerosis cutis: Secondary | ICD-10-CM

## 2014-02-15 DIAGNOSIS — G8929 Other chronic pain: Secondary | ICD-10-CM

## 2014-02-15 DIAGNOSIS — Z23 Encounter for immunization: Secondary | ICD-10-CM

## 2014-02-15 DIAGNOSIS — R51 Headache: Secondary | ICD-10-CM

## 2014-02-15 DIAGNOSIS — B354 Tinea corporis: Secondary | ICD-10-CM

## 2014-02-15 MED ORDER — KETOCONAZOLE 2 % EX CREA: 1.0000 "application " | TOPICAL_CREAM | Freq: Two times a day (BID) | CUTANEOUS | Status: DC

## 2014-02-15 MED ORDER — TRIAMCINOLONE ACETONIDE 0.1 % EX CREA: 1.0000 "application " | TOPICAL_CREAM | Freq: Two times a day (BID) | CUTANEOUS | Status: DC

## 2014-02-15 MED ORDER — CLONAZEPAM 0.5 MG PO TABS: 0.5000 mg | ORAL_TABLET | Freq: Every day | ORAL | Status: DC | PRN

## 2014-02-15 MED ORDER — INFLUENZA VAC SPLIT QUAD 0.5 ML IM SUSY: 0.5000 mL | PREFILLED_SYRINGE | INTRAMUSCULAR | Status: DC

## 2014-02-15 NOTE — Progress Notes (Signed)
Patient ID: Waylyn Tenbrink MRN: [id number], DOB: 1961-04-11, 53 y.o. Date of Encounter: 02/15/2014, 12:29 PM  Primary Physician: [name], MD  Chief Complaint: Follow up appointment   HPI: 53 y.o. year old male with history below presents for a follow up appointment. Pt states he had right shoulder pain last week, and it has gradually resolved without treatment. Pt has a hx of migraine HA, and states that his symptoms are being managed successfully. Pt would like a flu vaccine. Pt would like to reduce his Klonpin pain medication, from a 1mg  tablet, to .5mg  tablet. He states he currently cuts the 1mg  pill in half, and would like to cut the .5mg  in half.   Pt also is also complaining of a red rash-like lesion on his right forearm. He's had it for 7-10 days. He also notes dry scaly skin on his dorsal hands.   Past Medical History  Diagnosis Date  . Allergy   . Depression   . Migraine headache   . Anxiety   . Mitral valve prolapse   . Heart murmur      Home Meds: Prior to Admission medications   Medication Sig Start Date End Date Taking? Authorizing Provider  clonazePAM (KLONOPIN) 1 MG tablet Take 0.5 tablets (0.5 mg total) by mouth daily. 02/08/14   [name], MD  fluticasone (FLONASE) 50 MCG/ACT nasal spray Place 2 sprays into both nostrils daily. 10/15/13   [name], MD  ipratropium (ATROVENT) 0.03 % nasal spray Place 2 sprays into the nose 2 (two) times daily. 07/16/13   [name], MD  lidocaine (LIDODERM) 5 % Place 1 patch onto the skin daily. Remove & Discard patch within 12 hours or as directed by MD 02/08/14   [name], MD  metoCLOPramide (REGLAN) 5 MG tablet Take 5 mg by mouth every 6 (six) hours as needed for nausea.    Historical Provider, MD  mirtazapine (REMERON) 30 MG tablet TAKE ONE TABLET AT BEDTIME. 02/08/14   [name], MD  Naproxen Sodium (ALEVE PO) Take by mouth.    Historical Provider, MD    Allergies:  Allergies    Allergen Reactions  . Cephalexin Diarrhea and Nausea And Vomiting  . Cephalosporins Diarrhea and Nausea And Vomiting  . Compazine [Prochlorperazine Edisylate]     uncontrollable shake - tremor  . Erythromycin Nausea And Vomiting  . Ketorolac Tromethamine     REACTION: Reaction not known  . Latex     REACTION: Reaction not known  . Nalbuphine     REACTION: Reaction not known  . Prednisone Nausea And Vomiting  . Prochlorperazine Edisylate     REACTION: shakes/tremors  . Topamax [Topiramate] Diarrhea and Nausea And Vomiting  . Cefdinir Diarrhea, Nausea And Vomiting and Rash  . Neosporin [Neomycin-Bacitracin Zn-Polymyx] Rash    History   Social History  . Marital Status: Married    Spouse Name: N/A    Number of Children: N/A  . Years of Education: N/A   Occupational History  . Not on file.   Social History Main Topics  . Smoking status: Never Smoker   . Smokeless tobacco: Not on file  . Alcohol Use: No  . Drug Use: No  . Sexual Activity: Yes    Partners: Female   Other Topics Concern  . Not on file   Social History Narrative  . No narrative on file     Review of Systems: Constitutional: negative for chills, fever, night sweats, weight changes, or fatigue  HEENT:  negative for vision changes, hearing loss, congestion, rhinorrhea, ST, epistaxis, or sinus pressure Cardiovascular: negative for chest pain or palpitations Respiratory: negative for hemoptysis, wheezing, shortness of breath, or cough Abdominal: negative for abdominal pain, nausea, vomiting, diarrhea, or constipation Dermatological: positive for rash Neurologic: negative for headache, dizziness, or syncope All other systems reviewed and are otherwise negative with the exception to those above and in the HPI.   Physical Exam: Blood pressure 119/73, pulse 91, temperature 97.7 F (36.5 C), resp. rate 16, height 5\' 11"  (1.803 m), weight 135 lb (61.236 kg), SpO2 98.00%., Body mass index is 18.84  kg/(m^2). General: Well developed, well nourished, in no acute distress. Head: Normocephalic, atraumatic, eyes without discharge, sclera non-icteric, nares are without discharge. Bilateral auditory canals clear, TM's are without perforation, pearly grey and translucent with reflective cone of light bilaterally. Oral cavity moist, posterior pharynx without exudate, erythema, peritonsillar abscess, or post nasal drip.  Neck: Supple. No thyromegaly. Full ROM. No lymphadenopathy. Lungs: Clear bilaterally to auscultation without wheezes, rales, or rhonchi. Breathing is unlabored. Heart: RRR with S1 S2. No murmurs, rubs, or gallops appreciated. Abdomen: Soft, non-tender, non-distended with normoactive bowel sounds. No hepatomegaly. No rebound/guarding. No obvious abdominal masses. Msk:  Strength and tone normal for age. Extremities/Skin: Warm and dry. No clubbing or cyanosis. No edema. Dry, ichthyotic dorsal hands.  Circumferentially scaly 1 cm dorsal left forearm lesion Neuro: Alert and oriented X 3. Moves all extremities spontaneously. Gait is normal. CNII-XII grossly in tact. Psych:  Responds to questions appropriately with a normal affect.    ASSESSMENT AND PLAN:  53 y.o. year old male with Chronic nonintractable headache, unspecified headache type - Plan: Influenza vac split quadrivalent PF (FLUARIX) injection 0.5 mL, clonazePAM (KLONOPIN) 0.5 MG tablet  Tinea corporis - Plan: ketoconazole (NIZORAL) 2 % cream  Xerosis of skin - Plan: triamcinolone cream (KENALOG) 0.1 %   Signed, [name], MD 02/15/2014 12:29 PM

## 2014-02-16 NOTE — Telephone Encounter (Signed)
Pt came back into see Dr L and his pain has resolved w/out tx.

## 2014-02-25 ENCOUNTER — Encounter: Payer: Self-pay | Admitting: Family Medicine

## 2014-02-25 ENCOUNTER — Ambulatory Visit (INDEPENDENT_AMBULATORY_CARE_PROVIDER_SITE_OTHER): Payer: Medicare Other | Admitting: Family Medicine

## 2014-02-25 ENCOUNTER — Ambulatory Visit (INDEPENDENT_AMBULATORY_CARE_PROVIDER_SITE_OTHER): Payer: Medicare Other

## 2014-02-25 VITALS — BP 111/69 | HR 89 | Temp 98.2°F | Resp 16 | Ht 70.5 in | Wt 135.2 lb

## 2014-02-25 DIAGNOSIS — M25511 Pain in right shoulder: Secondary | ICD-10-CM

## 2014-02-25 DIAGNOSIS — Z299 Encounter for prophylactic measures, unspecified: Secondary | ICD-10-CM

## 2014-02-25 DIAGNOSIS — B354 Tinea corporis: Secondary | ICD-10-CM

## 2014-02-25 DIAGNOSIS — Z418 Encounter for other procedures for purposes other than remedying health state: Secondary | ICD-10-CM

## 2014-02-25 DIAGNOSIS — Z23 Encounter for immunization: Secondary | ICD-10-CM

## 2014-02-25 MED ORDER — TETANUS-DIPHTH-ACELL PERTUSSIS 5-2.5-18.5 LF-MCG/0.5 IM SUSP: 0.5000 mL | Freq: Once | INTRAMUSCULAR | Status: DC

## 2014-02-25 MED ORDER — CLOTRIMAZOLE-BETAMETHASONE 1-0.05 % EX CREA: 1.0000 "application " | TOPICAL_CREAM | Freq: Two times a day (BID) | CUTANEOUS | Status: DC

## 2014-02-25 NOTE — Patient Instructions (Signed)
Shoulder Range of Motion Exercises The shoulder is the most flexible joint in the human body. Because of this it is also the most unstable joint in the body. All ages can develop shoulder problems. Early treatment of problems is necessary for a good outcome. People react to shoulder pain by decreasing the movement of the joint. After a brief period of time, the shoulder can become "frozen". This is an almost complete loss of the ability to move the damaged shoulder. Following injuries your caregivers can give you instructions on exercises to keep your range of motion (ability to move your shoulder freely), or regain it if it has been lost.  [place]: Codman's Exercise or Pendulum Exercise  This exercise may be performed in a prone (face-down) lying position or standing while leaning on a chair with the opposite arm. Its purpose is to relax the muscles in your shoulder and slowly but surely increase the range of motion and to relieve pain.  Lie on your stomach close to the side edge of the bed. Let your weak arm hang over the edge of the bed. Relax your shoulder, arm and hand. Let your shoulder blade relax and drop down.  Slowly and gently swing your arm forward and back. Do not use your neck muscles; relax them. It might be easier to have someone else gently start swinging your arm.  As pain decreases, increase your swing. To start, arm swing should begin at 15 degree angles. In time and as pain lessens, move to 30-45 degree angles. Start with swinging for about 15 seconds, and work towards swinging for 3 to 5 minutes.  This exercise may also be performed in a standing/bent over position.  Stand and hold onto a sturdy chair with your good arm. Bend forward at the waist and bend your knees slightly to help protect your back. Relax your weak arm, let it hang limp. Relax your shoulder blade and let it drop.  Keep your shoulder relaxed and use body  motion to swing your arm in small circles.  Stand up tall and relax.  Repeat motion and change direction of circles.  Start with swinging for about 30 seconds, and work towards swinging for 3 to 5 minutes. STRETCHING EXERCISES:  Lift your arm out in front of you with the elbow bent at 90 degrees. Using your other arm gently pull the elbow forward and across your body.  Bend one arm behind you with the palm facing outward. Using the other arm, hold a towel or rope and reach this arm up above your head, then bend it at the elbow to move your wrist to behind your neck. Grab the free end of the towel with the hand behind your back. Gently pull the towel up with the hand behind your neck, gradually increasing the pull on the hand behind the small of your back. Then, gradually pull down with the hand behind the small of your back. This will pull the hand and arm behind your neck further. Both shoulders will have an increased range of motion with repetition of this exercise. STRENGTHENING EXERCISES:  Standing with your arm at your side and straight out from your shoulder with the elbow bent at 90 degrees, hold onto a small weight and slowly raise your hand so it points straight up in the air. Repeat this five times to begin with, and gradually increase to ten times. Do this four times per day. As you grow  times to begin with, and gradually increase to ten times. Do this four times per day. As you grow stronger you can gradually increase the weight.  · Repeat the above exercise, only this time using an elastic band. Start with your hand up in the air and pull down until your hand is by your side. As you grow stronger, gradually increase the amount you pull by increasing the number or size of the elastic bands. Use the same amount of repetitions.  · Standing with your hand at your side and holding onto a weight, gradually lift the hand in front of you until it is over your head. Do the same also with the hand remaining at your side and lift the hand away from your body until it is again over your head.  Repeat this five times to begin with, and gradually increase to ten times. Do this four times per day. As you grow stronger you can gradually increase the weight.  Document Released: 01/13/2003 Document Revised: 04/21/2013 Document Reviewed: 04/16/2005  [employer]® Patient Information ©2015 [employer]. This information is not intended to replace advice given to you by your health care provider. Make sure you discuss any questions you have with your health care provider.

## 2014-02-25 NOTE — Progress Notes (Signed)
° °  Subjective:    Patient ID: William [name], male    DOB: May 18, 1960, 53 y.o.   MRN: [id number] This chart was scribed for [name], MD by Zola Button, Medical Scribe. This patient was seen in Room 24 and the patient's care was started at 10:48 AM.   HPI HPI Comments: William [name] is a 53 y.o. male with a hx of migraines who presents to the [facility] for a follow-up. Patient does not have any HA currently. He complains of severe pain in his neck that radiates down his right arm. Patient notes having pain when raising his right arm up, and he feels like the pain goes to the bones in his arm. He does not remember any injury, but he thinks the pain may be due to heavy lifting months ago. He also notes having a rash on his right forearm; he is worried about the rash spreading.  Patient is married, but does not currently work.  Patient's 49 year old mother will come up from [place] during the holidays; patient has intent for his mother to move up to [place] permanently.   Review of Systems  Constitutional: Negative for fever.  HENT: Negative for ear discharge.   Eyes: Negative for discharge.  Respiratory: Negative for cough.   Gastrointestinal: Negative for diarrhea.  Genitourinary: Negative for hematuria.  Musculoskeletal: Positive for arthralgias, myalgias and neck pain.  Skin: Positive for rash.  Neurological: Negative for headaches.  Psychiatric/Behavioral: Negative for confusion.       Objective:   Physical Exam CONSTITUTIONAL: Well developed/well nourished HEAD: Normocephalic/atraumatic EYES: EOM/PERRL ENMT: Mucous membranes moist NECK: supple no meningeal signs Good ROM of neck. SPINE: entire spine nontender CV: S1/S2 noted, no murmurs/rubs/gallops noted LUNGS: Lungs are clear to auscultation bilaterally, no apparent distress ABDOMEN: soft, nontender, no rebound or guarding GU: no cva tenderness NEURO: Pt is awake/alert, moves all  extremitiesx4 EXTREMITIES: pulses normal. Pain with abduction of right shoulder. Tenderness below the right acromion. Inspection is normal. SKIN: warm, color normal. Right forearm 1cm scaly erythematous skin rash. PSYCH: no abnormalities of mood noted  I spent over 40 minutes face to face answering numerous questions about vaccines, demonstrating theraband, reviewing x-ray findings, and discussing the nature of tinea corporis  [facility] reading (PRIMARY) by  Dr. Joseph Art:  Negative right shoulder.      Assessment & Plan:   Right shoulder pain - Plan: DG Shoulder Right, Ambulatory referral to Orthopedic Surgery  Tinea corporis - Plan: clotrimazole-betamethasone (LOTRISONE) cream Tdap and hemoccults today as well  theraband exercises to start.  Signed, [name], MD

## 2014-03-22 ENCOUNTER — Ambulatory Visit: Payer: Medicare Other | Admitting: Family Medicine

## 2014-04-01 ENCOUNTER — Ambulatory Visit: Payer: Medicare Other | Admitting: Family Medicine

## 2014-04-08 ENCOUNTER — Encounter: Payer: Self-pay | Admitting: Cardiovascular Disease

## 2014-04-08 ENCOUNTER — Ambulatory Visit (INDEPENDENT_AMBULATORY_CARE_PROVIDER_SITE_OTHER): Payer: Medicare Other | Admitting: Cardiovascular Disease

## 2014-04-08 VITALS — BP 130/60 | HR 74 | Ht 70.0 in | Wt 138.0 lb

## 2014-04-08 DIAGNOSIS — R002 Palpitations: Secondary | ICD-10-CM

## 2014-04-08 NOTE — Patient Instructions (Signed)
Your physician recommends that you schedule a follow-up appointment as needed with Dr. McAlhany   

## 2014-04-08 NOTE — Progress Notes (Signed)
History of Present Illness: 53 yo AAM with history of palpitations/PVCs, migraine headaches and irritable bowel syndrome with previous diagnosis of mitral valve prolapse in 1989 who is here today for cardiac follow up. He has been known to have PVCs in the past. He did not tolerate beta blockers in the past. I started him on Cardizem CD 120 mg per day in 2011. He had a normal echo and stress test in 2011. Holter monitor in 2011 with no evidence of SVT, atrial fibrillation. On 07/03/12 he began to have a productive cough and chest pain and was seen in the [facility]. CTA chest negative for PE. He was seen later that day by Dr. Everlene Farrier in the [facility] and given c/o chest pain at that time, was sent to [facility] ED. EKG with sinus tach, rate 107 bpm. Troponin was negative in the ED. He described sweating, coughing, fever, chills and pleuritic chest pain with cough. He had noticed his heart racing. I arranged a 48 hour monitor and an echo. Echo with normal LV size and function. 48 hour monitor with normal sinus rhythm and rare PACs.   He is here today for cardiac follow up. He is feeling well. No chest pain or SOB. Rare palpitations.   Primary Care Physician: [facility] [facility], Dr. Joseph Art  Past Medical History  Diagnosis Date  . Allergy   . Depression   . Migraine headache   . Anxiety   . Mitral valve prolapse   . Heart murmur    No past surgical history on file.  Current Outpatient Prescriptions  Medication Sig Dispense Refill  . clonazePAM (KLONOPIN) 0.5 MG tablet Take 1 tablet (0.5 mg total) by mouth daily as needed for anxiety. May take 1/2 tab.  Wait until December to fill 90 tablet 1  . clotrimazole-betamethasone (LOTRISONE) cream Apply 1 application topically 2 (two) times daily. 30 g 0  . fluticasone (FLONASE) 50 MCG/ACT nasal spray Place 2 sprays into both nostrils daily. 16 g 6  . ipratropium (ATROVENT) 0.03 % nasal spray Place 2 sprays into the nose 2 (two) times  daily. 30 mL 5  . lidocaine (LIDODERM) 5 % Place 1 patch onto the skin daily. Remove & Discard patch within 12 hours or as directed by MD 30 patch 0  . metoCLOPramide (REGLAN) 5 MG tablet Take 5 mg by mouth every 6 (six) hours as needed for nausea.    . mirtazapine (REMERON) 30 MG tablet TAKE ONE TABLET AT BEDTIME. 90 tablet 3  . Naproxen Sodium (ALEVE PO) Take by mouth.    . triamcinolone cream (KENALOG) 0.1 % Apply 1 application topically 2 (two) times daily. 45 g 0   Current Facility-Administered Medications  Medication Dose Route Frequency Provider Last Rate Last Dose  . Influenza vac split quadrivalent PF (FLUARIX) injection 0.5 mL  0.5 mL Intramuscular Tomorrow-1000 [name], MD      . Tdap (BOOSTRIX) injection 0.5 mL  0.5 mL Intramuscular Once [name], MD        Allergies  Allergen Reactions  . Erythromycin Nausea And Vomiting and Anaphylaxis  . Cephalexin Diarrhea and Nausea And Vomiting  . Cephalosporins Diarrhea and Nausea And Vomiting  . Compazine [Prochlorperazine Edisylate]     uncontrollable shake - tremor  . Hydrocodone-Acetaminophen Nausea And Vomiting  . Ketoconazole Itching  . Ketorolac Nausea And Vomiting  . Ketorolac Tromethamine     REACTION: Reaction not known  . Latex Itching  REACTION: Reaction not known  . Nalbuphine Other (See Comments)    States loss of consciousness REACTION: Reaction not known  . Oxycodone-Acetaminophen Other (See Comments) and Swelling  . Prednisone Nausea And Vomiting  . Prochlorperazine Edisylate     REACTION: shakes/tremors  . Topamax [Topiramate] Diarrhea and Nausea And Vomiting  . Tramadol Nausea And Vomiting  . Cefdinir Diarrhea, Nausea And Vomiting and Rash  . Neosporin [Neomycin-Bacitracin Zn-Polymyx] Rash    History   Social History  . Marital Status: Married    Spouse Name: N/A    Number of Children: N/A  . Years of Education: N/A   Occupational History  . Not on file.   Social History Main  Topics  . Smoking status: Never Smoker   . Smokeless tobacco: Not on file  . Alcohol Use: No  . Drug Use: No  . Sexual Activity:    Partners: Female   Other Topics Concern  . Not on file   Social History Narrative    Family History  Problem Relation Age of Onset  . Hypertension Mother   . Hypertension Brother     Review of Systems:  As stated in the HPI and otherwise negative.   BP 130/60 mmHg  Pulse 74  Ht 5\' 10"  (1.778 m)  Wt 138 lb (62.596 kg)  BMI 19.80 kg/m2  Physical Examination: General: Well developed, well nourished, NAD HEENT: OP clear, mucus membranes moist SKIN: warm, dry. No rashes. Neuro: No focal deficits Musculoskeletal: Muscle strength 5/5 all ext Psychiatric: Mood and affect normal Neck: No JVD, no carotid bruits, no thyromegaly, no lymphadenopathy. Lungs:Clear bilaterally, no wheezes, rhonci, crackles Cardiovascular: Regular rate and rhythm. No murmurs, gallops or rubs. Abdomen:Soft. Bowel sounds present. Non-tender.  Extremities: No lower extremity edema. Pulses are 2 + in the bilateral DP/PT.  Echo 07/10/12: Left ventricle: The cavity size was normal. Wall thickness was normal. Systolic function was normal. The estimated ejection fraction was in the range of 60% to 65%. Wall motion was normal; there were no regional wall motion abnormalities. Left ventricular diastolic function parameters were normal.   Assessment and Plan:   1. Chest pain: Atypical. No recurrence. He has been feeling well.   2. Palpitations: PACs on cardiac monitor in 2014. Avoid stimulants and OTC meds with stimulant qualities.

## 2014-05-13 ENCOUNTER — Ambulatory Visit: Payer: Medicare Other | Admitting: Family Medicine

## 2014-05-20 ENCOUNTER — Encounter: Payer: Self-pay | Admitting: Family Medicine

## 2014-05-20 ENCOUNTER — Ambulatory Visit: Payer: Medicare Other | Admitting: Family Medicine

## 2014-05-20 ENCOUNTER — Ambulatory Visit (INDEPENDENT_AMBULATORY_CARE_PROVIDER_SITE_OTHER): Payer: Medicare Other | Admitting: Family Medicine

## 2014-05-20 VITALS — BP 109/75 | HR 88 | Temp 97.8°F | Resp 16 | Ht 71.5 in | Wt 142.0 lb

## 2014-05-20 DIAGNOSIS — G43809 Other migraine, not intractable, without status migrainosus: Secondary | ICD-10-CM

## 2014-05-20 MED ORDER — PROMETHAZINE HCL 12.5 MG PO TABS: 12.5000 mg | ORAL_TABLET | Freq: Three times a day (TID) | ORAL | Status: DC | PRN

## 2014-05-20 NOTE — Patient Instructions (Addendum)
I'm glad you are working on the diet  Continue the Remeron as is. Reduce the clonazepam to 0.5 mg daily for a month.  Then, alternate 0.5 mg one day and .25mg  for a month. Then let me see you  Exercise: Every other day for a month, then 5 days a week: -stair stepper x 12 min -treadmill at 4 mph x 12 min -5 lb dumbells:  2 reps of 10 flexion and raise over head -crunches:  20  -pushups:  10  See either me or Bennett Scrape when you come back in March

## 2014-05-20 NOTE — Progress Notes (Signed)
° °  Subjective:    Patient ID: William [name], male    DOB: [birth year], 54 y.o.   MRN: [id number] This chart was scribed for [name], MD by Zola Button, Medical Scribe. This patient was seen in Room 27 and the patient's care was started at 12:28 PM.   HPI HPI Comments: William [name] is a 54 y.o. male who presents to the [facility] complaining of chronic left facial migraines.  He has tapered the clonazepam down to .5 mg daily.     Note from 02/15/14: William [name] is a 54 y.o. male with a hx of migraines who presents to the [facility] for a follow-up. Patient does not have any HA currently. He complains of severe pain in his neck that radiates down his right arm. Patient notes having pain when raising his right arm up, and he feels like the pain goes to the bones in his arm. He does not remember any injury, but he thinks the pain may be due to heavy lifting months ago. He also notes having a rash on his right forearm; he is worried about the rash spreading.  Patient is married, but does not currently work.  He stopped work in 2011 at [employer], and started on disability in 2013.  Patient's 93 year old mother will come up from [place] during the holidays; patient has intent for his mother to move up to [place] permanently.   Review of Systems No new symptoms of stomach upset, palpitations, or headache    Objective:   Physical Exam CONSTITUTIONAL: Well developed/well nourished HEAD: Normocephalic/atraumatic EYES: EOM/PERRL ENMT: Mucous membranes moist NECK: supple no meningeal signs SPINE: entire spine nontender CV: S1/S2 noted, no murmurs/rubs/gallops noted LUNGS: Lungs are clear to auscultation bilaterally, no apparent distress ABDOMEN: soft, nontender, no rebound or guarding GU: no cva tenderness NEURO: Pt is awake/alert, moves all extremitiesx4; mild left facial weakness; gait stable EXTREMITIES: pulses normal, full  ROM SKIN: warm, color normal PSYCH: no abnormalities of mood noted     Assessment & Plan:   This chart was scribed in my presence and reviewed by me personally.    ICD-9-CM ICD-10-CM   1. Other type of migraine 346.80 G43.809 promethazine (PHENERGAN) 12.5 MG tablet   Patient seems to be doing well lowering the clonazepam gradually.  He has not had narcotics in over 6 months.   He will be starting exercise program, nutrition program.  He is continuing the acpuncture.  I will see him again in 2 months, then CPE in 4 months.  Signed, [name], MD

## 2014-06-10 ENCOUNTER — Encounter: Payer: Self-pay | Admitting: Family Medicine

## 2014-06-10 ENCOUNTER — Ambulatory Visit (INDEPENDENT_AMBULATORY_CARE_PROVIDER_SITE_OTHER): Payer: Medicare Other | Admitting: Family Medicine

## 2014-06-10 ENCOUNTER — Ambulatory Visit: Payer: Self-pay | Admitting: Family Medicine

## 2014-06-10 VITALS — BP 106/66 | HR 98 | Temp 98.1°F | Resp 16 | Ht 70.0 in | Wt 139.8 lb

## 2014-06-10 DIAGNOSIS — J0100 Acute maxillary sinusitis, unspecified: Secondary | ICD-10-CM

## 2014-06-10 DIAGNOSIS — R519 Headache, unspecified: Secondary | ICD-10-CM

## 2014-06-10 DIAGNOSIS — R51 Headache: Secondary | ICD-10-CM

## 2014-06-10 MED ORDER — FLUTICASONE PROPIONATE 50 MCG/ACT NA SUSP: 2.0000 | Freq: Every day | NASAL | Status: DC

## 2014-06-10 NOTE — Progress Notes (Signed)
This chart was scribed for [name], MD by Edison Simon, ED Scribe.   Patient ID: William [name] MRN: [id number], DOB: August 07, 1960, 54 y.o. Date of Encounter: 06/10/2014, 1:55 PM  Primary Physician: [name], MD  Chief Complaint:  Chief Complaint  Patient presents with  . Follow-up    MIGRAINES     HPI: 54 y.o. year old male with history below significant for migraines presents with mild headache with onset 3 days ago. He states he was active last week and did not have headaches, but then relaxed 3 days ago and had onset of headache. He states he has been doing the aerobic exercises recommended to him and that they seem to be helping, and he is trying to make recommended diet changes as well. He states he has been gradually adding to his exercise routine and has not gotten to weights yet. He also notes he has recently had difficulty sleeping through the night. He states he has been going to bed at 10 pm but has sometimes been staying up reading until midnight. He states he got to bed at 0300 last night and rose at 0600. He uses Klonopin and Remeron but has been decreasing his dosage. He denies tinnitus. He has questions about meditation.   Discussed with patient that he might try head massages at [facility] by Angie.   Past Medical History  Diagnosis Date  . Allergy   . Depression   . Migraine headache   . Anxiety   . Mitral valve prolapse   . Heart murmur      Home Meds: Prior to Admission medications   Medication Sig Start Date End Date Taking? Authorizing Provider  clonazePAM (KLONOPIN) 0.5 MG tablet Take 1 tablet (0.5 mg total) by mouth daily as needed for anxiety. May take 1/2 tab.  Wait until December to fill 02/15/14  Yes [name], MD  lidocaine (LIDODERM) 5 % Place 1 patch onto the skin daily. Remove & Discard patch within 12 hours or as directed by MD 02/08/14  Yes [name], MD  metoCLOPramide (REGLAN) 5 MG tablet Take 5 mg by  mouth every 6 (six) hours as needed for nausea.   Yes Historical Provider, MD  mirtazapine (REMERON) 30 MG tablet TAKE ONE TABLET AT BEDTIME. 02/08/14  Yes [name], MD  Naproxen Sodium (ALEVE PO) Take by mouth.   Yes Historical Provider, MD  promethazine (PHENERGAN) 12.5 MG tablet Take 1 tablet (12.5 mg total) by mouth every 8 (eight) hours as needed for nausea or vomiting. 05/20/14  Yes [name], MD  fluticasone (FLONASE) 50 MCG/ACT nasal spray Place 2 sprays into both nostrils daily. Patient not taking: Reported on 06/10/2014 10/15/13   [name], MD    Allergies:  Allergies  Allergen Reactions  . Erythromycin Nausea And Vomiting and Anaphylaxis  . Cephalexin Diarrhea and Nausea And Vomiting  . Cephalosporins Diarrhea and Nausea And Vomiting  . Compazine [Prochlorperazine Edisylate]     uncontrollable shake - tremor  . Hydrocodone-Acetaminophen Nausea And Vomiting  . Ketoconazole Itching  . Ketorolac Nausea And Vomiting  . Ketorolac Tromethamine     REACTION: Reaction not known  . Latex Itching    REACTION: Reaction not known  . Nalbuphine Other (See Comments)    States loss of consciousness REACTION: Reaction not known  . Oxycodone-Acetaminophen Other (See Comments) and Swelling  . Prednisone Nausea And Vomiting  . Prochlorperazine Edisylate     REACTION: shakes/tremors  . Topamax [Topiramate] Diarrhea  and Nausea And Vomiting  . Tramadol Nausea And Vomiting  . Cefdinir Diarrhea, Nausea And Vomiting and Rash  . Neosporin [Neomycin-Bacitracin Zn-Polymyx] Rash    History   Social History  . Marital Status: Married    Spouse Name: N/A  . Number of Children: N/A  . Years of Education: N/A   Occupational History  . Not on file.   Social History Main Topics  . Smoking status: Never Smoker   . Smokeless tobacco: Not on file  . Alcohol Use: No  . Drug Use: No  . Sexual Activity:    Partners: Female   Other Topics Concern  . Not on file   Social  History Narrative     Review of Systems: Constitutional: negative for chills, fever, night sweats, weight changes, or fatigue  HEENT: negative for vision changes, hearing loss, congestion, rhinorrhea, ST, epistaxis, or sinus pressure Cardiovascular: negative for chest pain or palpitations Respiratory: negative for hemoptysis, wheezing, shortness of breath, or cough Abdominal: negative for abdominal pain, nausea, vomiting, diarrhea, or constipation Dermatological: negative for rash Neurologic: negative for dizziness or syncope, positive headache, sleep disturbance All other systems reviewed and are otherwise negative with the exception to those above and in the HPI.   Physical Exam: Blood pressure 106/66, pulse 98, temperature 98.1 F (36.7 C), temperature source Oral, resp. rate 16, height 5\' 10"  (1.778 m), weight 139 lb 12.8 oz (63.413 kg), SpO2 97 %., Body mass index is 20.06 kg/(m^2). General: Well developed, well nourished, in no acute distress. Head: Normocephalic, atraumatic, eyes without discharge, sclera non-icteric, nares are without discharge. Bilateral auditory canals clear, TM's are without perforation, pearly grey and translucent with reflective cone of light bilaterally. Oral cavity moist, posterior pharynx without exudate, erythema, peritonsillar abscess, or post nasal drip. swelling to right nare.  Neck: Supple. No thyromegaly. Full ROM. No lymphadenopathy. Lungs: Clear bilaterally to auscultation without wheezes, rales, or rhonchi. Breathing is unlabored. Heart: RRR with S1 S2. No murmurs, rubs, or gallops appreciated. Abdomen: Soft, non-tender, non-distended with normoactive bowel sounds. No hepatomegaly. No rebound/guarding. No obvious abdominal masses. Msk:  Strength and tone normal for age. Extremities/Skin: Warm and dry. No clubbing or cyanosis. No edema. No rashes or suspicious lesions. Neuro: Alert and oriented X 3. Moves all extremities spontaneously. Gait is normal.  CNII-XII grossly in tact. Psych:  Responds to questions appropriately with a normal affect.   Labs:   ASSESSMENT AND PLAN:  54 y.o. year old male with  This chart was scribed in my presence and reviewed by me personally.    ICD-9-CM ICD-10-CM   1. Chronic intractable headache, unspecified headache type 784.0 R51   2. Subacute maxillary sinusitis 461.0 J01.00 fluticasone (FLONASE) 50 MCG/ACT nasal spray   Approach for the next month 1. Continue current dose of clonazepam 2. Lie down for bed at 10 pm 3. Consider yoga/meditation:  I'll get some names for you 4. Consider Angie at [facility]:  I'll get the phone number for you 5. Continue the nutrition review  Signed, [name], MD    Signed, [name], MD 06/10/2014 1:55 PM

## 2014-06-10 NOTE — Patient Instructions (Signed)
Approach for the next month 1. Continue current dose of clonazepam 2. Lie down for bed at 10 pm 3. Consider yoga/meditation:  I'll get some names for you 4. Consider Angie at [facility]:  I'll get the phone number for you 5. Continue the nutrition review

## 2014-07-15 ENCOUNTER — Telehealth: Payer: Self-pay

## 2014-07-15 ENCOUNTER — Ambulatory Visit (INDEPENDENT_AMBULATORY_CARE_PROVIDER_SITE_OTHER): Payer: Medicare Other | Admitting: Family Medicine

## 2014-07-15 ENCOUNTER — Encounter: Payer: Self-pay | Admitting: Family Medicine

## 2014-07-15 VITALS — BP 113/72 | HR 81 | Temp 98.0°F | Resp 16 | Ht 70.5 in | Wt 141.4 lb

## 2014-07-15 DIAGNOSIS — R519 Headache, unspecified: Secondary | ICD-10-CM

## 2014-07-15 DIAGNOSIS — J0100 Acute maxillary sinusitis, unspecified: Secondary | ICD-10-CM | POA: Diagnosis not present

## 2014-07-15 DIAGNOSIS — R197 Diarrhea, unspecified: Secondary | ICD-10-CM

## 2014-07-15 DIAGNOSIS — R51 Headache: Secondary | ICD-10-CM

## 2014-07-15 DIAGNOSIS — E739 Lactose intolerance, unspecified: Secondary | ICD-10-CM | POA: Diagnosis not present

## 2014-07-15 DIAGNOSIS — M25551 Pain in right hip: Secondary | ICD-10-CM

## 2014-07-15 DIAGNOSIS — F411 Generalized anxiety disorder: Secondary | ICD-10-CM | POA: Diagnosis not present

## 2014-07-15 DIAGNOSIS — G43809 Other migraine, not intractable, without status migrainosus: Secondary | ICD-10-CM

## 2014-07-15 DIAGNOSIS — G8929 Other chronic pain: Secondary | ICD-10-CM

## 2014-07-15 MED ORDER — CLONAZEPAM 0.5 MG PO TABS: 0.5000 mg | ORAL_TABLET | Freq: Every day | ORAL | Status: DC | PRN

## 2014-07-15 MED ORDER — MIRTAZAPINE 30 MG PO TABS
ORAL_TABLET | ORAL | Status: DC
Start: 2014-07-15 — End: 2014-09-09

## 2014-07-15 MED ORDER — PROMETHAZINE HCL 12.5 MG PO TABS: 12.5000 mg | ORAL_TABLET | Freq: Three times a day (TID) | ORAL | Status: DC | PRN

## 2014-07-15 MED ORDER — FLUTICASONE PROPIONATE 50 MCG/ACT NA SUSP: 2.0000 | Freq: Every day | NASAL | Status: DC

## 2014-07-15 NOTE — Patient Instructions (Addendum)
1.  Resume your regular exercise routine  2.  Taper the Clonazepam to 1/2 tablet every other day for 2 weeks, then 1/2 table tevery day but Wednesday and Sunday when you take a full tablet  3. Detox diet:  Avoid beef for a month Increase herbs:  Sage, tarragon, turmeric, oregan to spice vegetables  Try cooking chunks (1" pieces) of onion, potato, turnip, and mushrooms after mixing them with olive oil on baking pain and cooking at 400 degrees on baking pain turning the vegetables every 8 minutes until they are soft (about 25 minutes

## 2014-07-15 NOTE — Telephone Encounter (Signed)
Pt's paperwork has been scanned and faxed.

## 2014-07-15 NOTE — Progress Notes (Addendum)
Patient ID: William [name], male   DOB: 05-30-1960, 54 y.o.   MRN: [id number]  This chart was scribed for William Haber, MD by Ladene Artist, ED Scribe. The patient was seen in room 26. Patient's care was started at 11:53 AM.  Patient ID: William [name] MRN: [id number], DOB: 1960-06-19, 54 y.o. Date of Encounter: 07/15/2014, 12:19 PM  Primary Physician: William Haber, MD  Chief Complaint  Patient presents with  . Hip Pain  . Follow-up    chronic pain    HPI: 54 y.o. year old male with history below presents for a follow-up. Pt states that he has taken 0.5 mg Klonopin for a while but wishes to decrease this dose.   Hip Pain Pt reports intermittent R hip pain and intermittent R leg stiffness in the mornings for the past few weeks. He states that his R hip "gives" with walking at times. He denies pain at this time. Pt reports that he has not been walking for exercise as much as he had previously.  Migraine HA Pt reports more frequent migraine HAs.   Lactose Intolerance  Pt states that he ate a sandwich from [employer] last week that had cheese on it. Pt reports diarrhea for 4 days after consuming the sandwich. He treated with imodium without relief and Pedialyte with relief. Pt drinks lactose-free milk.   Past Medical History  Diagnosis Date  . Allergy   . Depression   . Migraine headache   . Anxiety   . Mitral valve prolapse   . Heart murmur      Home Meds: Prior to Admission medications   Medication Sig Start Date End Date Taking? Authorizing Provider  clonazePAM (KLONOPIN) 0.5 MG tablet Take 1 tablet (0.5 mg total) by mouth daily as needed for anxiety. May take 1/2 tab.  Wait until December to fill 02/15/14   William Haber, MD  fluticasone [facility]) 50 MCG/ACT nasal spray Place 2 sprays into both nostrils daily. 06/10/14   William Haber, MD  lidocaine (LIDODERM) 5 % Place 1 patch onto the skin daily. Remove & Discard patch within 12 hours or as directed  by MD 02/08/14   William Haber, MD  metoCLOPramide (REGLAN) 5 MG tablet Take 5 mg by mouth every 6 (six) hours as needed for nausea.    Historical Provider, MD  mirtazapine (REMERON) 30 MG tablet TAKE ONE TABLET AT BEDTIME. 02/08/14   William Haber, MD  Naproxen Sodium (ALEVE PO) Take by mouth.    Historical Provider, MD  promethazine (PHENERGAN) 12.5 MG tablet Take 1 tablet (12.5 mg total) by mouth every 8 (eight) hours as needed for nausea or vomiting. 05/20/14   William Haber, MD    Allergies:  Allergies  Allergen Reactions  . Erythromycin Nausea And Vomiting and Anaphylaxis  . Cephalexin Diarrhea and Nausea And Vomiting  . Cephalosporins Diarrhea and Nausea And Vomiting  . Compazine [Prochlorperazine Edisylate]     uncontrollable shake - tremor  . Hydrocodone-Acetaminophen Nausea And Vomiting  . Ketoconazole Itching  . Ketorolac Nausea And Vomiting  . Ketorolac Tromethamine     REACTION: Reaction not known  . Latex Itching    REACTION: Reaction not known  . Nalbuphine Other (See Comments)    States loss of consciousness REACTION: Reaction not known  . Oxycodone-Acetaminophen Other (See Comments) and Swelling  . Prednisone Nausea And Vomiting  . Prochlorperazine Edisylate     REACTION: shakes/tremors  . Topamax [Topiramate] Diarrhea and Nausea And Vomiting  . Tramadol Nausea And  Vomiting  . Cefdinir Diarrhea, Nausea And Vomiting and Rash  . Neosporin [Neomycin-Bacitracin Zn-Polymyx] Rash    History   Social History  . Marital Status: Married    Spouse Name: N/A  . Number of Children: N/A  . Years of Education: N/A   Occupational History  . Not on file.   Social History Main Topics  . Smoking status: Never [name]   . Smokeless tobacco: Not on file  . Alcohol Use: No  . Drug Use: No  . Sexual Activity:    Partners: Female   Other Topics Concern  . Not on file   Social History Narrative    Review of Systems: Constitutional: negative for chills, fever,  night sweats, weight changes, or fatigue  HEENT: negative for vision changes, hearing loss, congestion, rhinorrhea, ST, epistaxis, or sinus pressure Cardiovascular: negative for chest pain or palpitations Respiratory: negative for hemoptysis, wheezing, shortness of breath, or cough Abdominal: negative for abdominal pain, nausea, vomiting, or constipation, + diarrhea Msk: + arthralgias  Dermatological: negative for rash Neurologic: negative for dizziness, or syncope, + headaches  All other systems reviewed and are otherwise negative with the exception to those above and in the HPI.  Physical Exam: Blood pressure 113/72, pulse 81, temperature 98 F (36.7 C), temperature source Oral, resp. rate 16, height 5' 10.5" (1.791 m), weight 141 lb 6.4 oz (64.139 kg), SpO2 96 %., Body mass index is 20 kg/(m^2). General: Well developed, well nourished, in no acute distress. Head: Normocephalic, atraumatic, eyes without discharge, sclera non-icteric, nares are without discharge. Bilateral auditory canals clear, TM's are without perforation, pearly grey and translucent with reflective cone of light bilaterally. Oral cavity moist, posterior pharynx without exudate, erythema, peritonsillar abscess, or post nasal drip.  Neck: Supple. No thyromegaly. Full ROM. No lymphadenopathy. Lungs: Clear bilaterally to auscultation without wheezes, rales, or rhonchi. Breathing is unlabored. Heart: RRR with S1 S2. No murmurs, rubs, or gallops appreciated. Good pulses.  Abdomen: Soft, non-tender, non-distended with normoactive bowel sounds. No hepatomegaly. No rebound/guarding. No obvious abdominal masses. Msk:  Strength and tone normal for age. Normal ROM of R hip. No pain on palpation. No loss of muscle mass.  Extremities/Skin: Warm and dry. No clubbing or cyanosis. No edema. No rashes or suspicious lesions.  Neuro: Alert and oriented X 3. Moves all extremities spontaneously. Gait is normal. CNII-XII grossly in tact. Psych:   Responds to questions appropriately with a normal affect.   I spent 40 minutes with patient and wife, face to face, reviewing his diet and exercise, headaches and hip pain as well as his recent episode of diarrhea after eating cheese  ASSESSMENT AND PLAN:  54 y.o. year old male with  1. Chronic nonintractable headache, unspecified headache type   2. Subacute maxillary sinusitis   3. Anxiety state   4. Other type of migraine   5. Diarrhea   6. Lactose intolerance   7. Right hip pain    This chart was scribed in my presence and reviewed by me personally.    ICD-9-CM ICD-10-CM   1. Chronic nonintractable headache, unspecified headache type 784.0 R51 clonazePAM (KLONOPIN) 0.5 MG tablet  2. Subacute maxillary sinusitis 461.0 J01.00 fluticasone (FLONASE) 50 MCG/ACT nasal spray  3. Anxiety state 300.00 F41.1 mirtazapine (REMERON) 30 MG tablet  4. Other type of migraine 346.80 G43.809 promethazine (PHENERGAN) 12.5 MG tablet  5. Diarrhea 787.91 R19.7   6. Lactose intolerance 271.3 E73.9   7. Right hip pain 719.45 M25.551    1.  Resume your regular exercise routine  2.  Taper the Clonazepam to 1/2 tablet every other day for 2 weeks, then 1/2 table tevery day but Wednesday and Sunday when you take a full tablet  3. Detox diet:  Avoid beef for a month Increase herbs:  Sage, tarragon, turmeric, oregan to spice vegetables  Try cooking chunks (1" pieces) of onion, potato, turnip, and mushrooms after mixing them with olive oil on baking pain and cooking at 400 degrees on baking pain turning the vegetables every 8 minutes until they are soft (about 25 minutes)  Signed, William Haber, MD  Signed, William Haber, MD 07/15/2014 12:19 PM

## 2014-07-22 ENCOUNTER — Ambulatory Visit: Payer: Medicare Other | Admitting: Family Medicine

## 2014-09-09 ENCOUNTER — Other Ambulatory Visit: Payer: Self-pay | Admitting: Family Medicine

## 2014-09-09 ENCOUNTER — Encounter: Payer: Self-pay | Admitting: Family Medicine

## 2014-09-09 ENCOUNTER — Ambulatory Visit (INDEPENDENT_AMBULATORY_CARE_PROVIDER_SITE_OTHER): Payer: Medicare Other | Admitting: Family Medicine

## 2014-09-09 VITALS — BP 128/84 | HR 84 | Temp 97.8°F | Resp 16 | Ht 70.25 in | Wt 141.0 lb

## 2014-09-09 DIAGNOSIS — F411 Generalized anxiety disorder: Secondary | ICD-10-CM | POA: Diagnosis not present

## 2014-09-09 DIAGNOSIS — J0100 Acute maxillary sinusitis, unspecified: Secondary | ICD-10-CM | POA: Diagnosis not present

## 2014-09-09 DIAGNOSIS — Z0000 Encounter for general adult medical examination without abnormal findings: Secondary | ICD-10-CM

## 2014-09-09 DIAGNOSIS — G8929 Other chronic pain: Secondary | ICD-10-CM

## 2014-09-09 DIAGNOSIS — G43809 Other migraine, not intractable, without status migrainosus: Secondary | ICD-10-CM | POA: Diagnosis not present

## 2014-09-09 DIAGNOSIS — R51 Headache: Secondary | ICD-10-CM

## 2014-09-09 LAB — CBC WITH DIFFERENTIAL/PLATELET
Basophils Absolute: 0 10*3/uL (ref 0.0–0.1)
Basophils Relative: 0 % (ref 0–1)
Eosinophils Absolute: 0.1 10*3/uL (ref 0.0–0.7)
Eosinophils Relative: 1 % (ref 0–5)
HCT: 44.3 % (ref 39.0–52.0)
Hemoglobin: 15.7 g/dL (ref 13.0–17.0)
Lymphocytes Relative: 24 % (ref 12–46)
Lymphs Abs: 1.7 10*3/uL (ref 0.7–4.0)
MCH: 31.4 pg (ref 26.0–34.0)
MCHC: 35.4 g/dL (ref 30.0–36.0)
MCV: 88.6 fL (ref 78.0–100.0)
MPV: 10.2 fL (ref 8.6–12.4)
Monocytes Absolute: 0.4 10*3/uL (ref 0.1–1.0)
Monocytes Relative: 6 % (ref 3–12)
Neutro Abs: 4.9 10*3/uL (ref 1.7–7.7)
Neutrophils Relative %: 69 % (ref 43–77)
Platelets: 251 10*3/uL (ref 150–400)
RBC: 5 MIL/uL (ref 4.22–5.81)
RDW: 13 % (ref 11.5–15.5)
WBC: 7.1 10*3/uL (ref 4.0–10.5)

## 2014-09-09 LAB — BASIC METABOLIC PANEL WITH GFR
BUN: 9 mg/dL (ref 6–23)
CO2: 24 mEq/L (ref 19–32)
Calcium: 9.6 mg/dL (ref 8.4–10.5)
Chloride: 103 mEq/L (ref 96–112)
Creat: 1.07 mg/dL (ref 0.50–1.35)
GFR, Est African American: >89 mL/min
GFR, Est Non African American: 78 mL/min
Glucose, Bld: 79 mg/dL (ref 70–99)
Potassium: 4.1 mEq/L (ref 3.5–5.3)
Sodium: 138 mEq/L (ref 135–145)

## 2014-09-09 LAB — POCT URINALYSIS DIPSTICK
Bilirubin, UA: NEGATIVE
Glucose, UA: NEGATIVE
Ketones, UA: NEGATIVE
Leukocytes, UA: NEGATIVE
Nitrite, UA: NEGATIVE
Protein, UA: NEGATIVE
Spec Grav, UA: 1.01
Urobilinogen, UA: 0.2
pH, UA: 6.5

## 2014-09-09 LAB — LIPID PANEL
Cholesterol: 233 mg/dL — ABNORMAL HIGH (ref 0–200)
HDL: 69 mg/dL (ref >=40)
LDL Cholesterol: 147 mg/dL — ABNORMAL HIGH (ref 0–99)
Total CHOL/HDL Ratio: 3.4 Ratio
Triglycerides: 84 mg/dL (ref <150)
VLDL: 17 mg/dL (ref 0–40)

## 2014-09-09 LAB — IFOBT (OCCULT BLOOD): IFOBT: NEGATIVE

## 2014-09-09 MED ORDER — PROMETHAZINE HCL 12.5 MG PO TABS: 12.5000 mg | ORAL_TABLET | Freq: Three times a day (TID) | ORAL | Status: DC | PRN

## 2014-09-09 MED ORDER — CLONAZEPAM 0.5 MG PO TABS: 0.5000 mg | ORAL_TABLET | Freq: Every day | ORAL | Status: DC | PRN

## 2014-09-09 MED ORDER — MIRTAZAPINE 30 MG PO TABS: ORAL_TABLET | ORAL | Status: DC

## 2014-09-09 MED ORDER — FLUTICASONE PROPIONATE 50 MCG/ACT NA SUSP: 2.0000 | Freq: Every day | NASAL | Status: DC

## 2014-09-09 NOTE — Patient Instructions (Signed)

## 2014-09-09 NOTE — Progress Notes (Signed)
° °  Subjective:    Patient ID: William [name], male    DOB: [birth year], 54 y.o.   MRN: [id number] This chart was scribed for [name], MD by Zola Button, Medical Scribe. This patient was seen in Room 25 and the patient's care was started at 10:12 AM.   HPI HPI Comments: William [name] is a 54 y.o. male with a hx of migraine headache who presents to the [facility] for a complete physical exam. He has weaned down to 0.25 mg Klonopin every other day, but is still on a regular dose of Remeron. He hopes to come off Klonopin by this July. Patient notes since decreasing the Klonopin, he has had more clarity and improved speech. He believes the Klonopin has been causing him GI issues. His migraine headaches have improved overall, having decreased in frequency and intensity.  Patient states he is due for a colonoscopy. His last colonoscopy was done by Dr. Paulita Fujita. He has an upcoming appointment with him to check up on his gastroparesis; he is not scheduled for colonoscopy. His last tetanus shot was last year.   He has been on disabilities since 2013. 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 His wife works at [employer] in [employer] [place], working with [place] children.  Immunization History  Administered Date(s) Administered   Influenza,inj,Quad PF,36+ Mos 02/19/2013, 02/15/2014   Td 07/29/2000   Tdap 02/25/2014    Review of Systems Having some early erectile dysfunction, wary of Viagra because of headache side effect Seeing Dr. Paulita Fujita for gastroparesis, has appt.  Thinks he is due for colonoscopy Interested in Vocational or Occupational evaluation now that headaches are diminished.    Objective:   Physical Exam CONSTITUTIONAL: Well developed/well nourished HEAD: Normocephalic/atraumatic EYES: EOM/PERRL ENMT: Mucous membranes moist. Ears are normal. Oropharynx normal. NECK: supple no meningeal signs SPINE: entire spine  nontender CV: S1/S2 noted, no murmurs/rubs/gallops noted. Regular rate and rhythm. Normal heart sounds. LUNGS: Lungs are clear to auscultation bilaterally, no apparent distress ABDOMEN: soft, nontender, no rebound or guarding GU: no cva tenderness NEURO: Pt is awake/alert, moves all extremitiesx4 EXTREMITIES: pulses normal, full ROM SKIN: warm, color normal PSYCH: no abnormalities of mood noted  Genitalia:  Normal male genitalia without hernia Rectal:  Normal anus, normal prostate      Assessment & Plan:   This chart was scribed in my presence and reviewed by me personally.    ICD-9-CM ICD-10-CM   1. Annual physical exam V70.0 D97.41 BASIC METABOLIC PANEL WITH GFR     CBC with Differential/Platelet     Lipid panel     PSA     POCT urinalysis dipstick     IFOBT POC (occult bld, rslt in office)  2. Anxiety state 300.00 F41.1 mirtazapine (REMERON) 30 MG tablet     Ambulatory referral to Occupational Medicine  3. Chronic nonintractable headache, unspecified headache type 784.0 R51 clonazePAM (KLONOPIN) 0.5 MG tablet     Ambulatory referral to Occupational Medicine  4. Subacute maxillary sinusitis 461.0 J01.00 fluticasone (FLONASE) 50 MCG/ACT nasal spray  5. Other type of migraine 346.80 G43.809 promethazine (PHENERGAN) 12.5 MG tablet     Ambulatory referral to Occupational Medicine     Signed, [name], MD

## 2014-09-09 NOTE — Progress Notes (Signed)
   Subjective:    Patient ID: William [name], male    DOB: [birth year], 54 y.o.   MRN: [id number]  HPI    Review of Systems  Constitutional: Negative.   Eyes: Negative.   Respiratory: Negative.   Cardiovascular: Negative.   Gastrointestinal: Negative.   Endocrine: Negative.   Genitourinary: Negative.   Musculoskeletal: Negative.   Skin: Negative.   Allergic/Immunologic: Negative.   Neurological: Positive for headaches.  Hematological: Negative.   Psychiatric/Behavioral: Negative.        Objective:   Physical Exam        Assessment & Plan:

## 2014-09-10 ENCOUNTER — Telehealth: Payer: Self-pay

## 2014-09-10 DIAGNOSIS — Z79899 Other long term (current) drug therapy: Secondary | ICD-10-CM

## 2014-09-10 LAB — PSA: PSA: 1.1 ng/mL (ref <=4.00)

## 2014-09-10 NOTE — Telephone Encounter (Signed)
Pt notified of labs. Wants to know if we can add a Vit D to his lab.

## 2014-09-11 NOTE — Telephone Encounter (Signed)
Called and added this on at [facility].

## 2014-09-11 NOTE — Telephone Encounter (Signed)
Looks like vitamin d has been added.

## 2014-09-12 ENCOUNTER — Other Ambulatory Visit: Payer: Self-pay | Admitting: Family Medicine

## 2014-09-12 LAB — VITAMIN D 25 HYDROXY (VIT D DEFICIENCY, FRACTURES): Vit D, 25-Hydroxy: 10 ng/mL — ABNORMAL LOW (ref 30–100)

## 2014-09-12 MED ORDER — VITAMIN D3 1.25 MG (50000 UT) PO CAPS: 1.0000 | ORAL_CAPSULE | ORAL | Status: DC

## 2014-09-13 ENCOUNTER — Telehealth: Payer: Self-pay

## 2014-09-13 NOTE — Telephone Encounter (Signed)
Pt called concerned. The rx for Clonazepam that we gave him on 5/12 says do not fill until December. Told him that this was probably an old note, to tear up that rx, and that I would call it in for him.

## 2014-10-10 ENCOUNTER — Ambulatory Visit (INDEPENDENT_AMBULATORY_CARE_PROVIDER_SITE_OTHER): Payer: Medicare Other | Admitting: Family Medicine

## 2014-10-10 VITALS — BP 114/72 | HR 102 | Temp 98.3°F | Resp 18 | Ht 71.5 in | Wt 143.0 lb

## 2014-10-10 DIAGNOSIS — B356 Tinea cruris: Secondary | ICD-10-CM | POA: Diagnosis not present

## 2014-10-10 DIAGNOSIS — F411 Generalized anxiety disorder: Secondary | ICD-10-CM

## 2014-10-10 DIAGNOSIS — G43001 Migraine without aura, not intractable, with status migrainosus: Secondary | ICD-10-CM

## 2014-10-10 MED ORDER — CLOTRIMAZOLE-BETAMETHASONE 1-0.05 % EX CREA: 1.0000 "application " | TOPICAL_CREAM | Freq: Two times a day (BID) | CUTANEOUS | Status: DC

## 2014-10-10 NOTE — Progress Notes (Signed)
Patient ID: William [name], male   DOB: 1961/02/01, 54 y.o.   MRN: [id number]   This chart was scribed for [name], MD by [facility], medical scribe at [facility].The patient was seen in exam room 02 and the patient's care was started at 9:18 AM.  Patient ID: William [name] MRN: [id number], DOB: January 22, 1961, 54 y.o. Date of Encounter: 10/10/2014  Primary Physician: [name], MD  Chief Complaint:  Chief Complaint  Patient presents with  . Follow-up    klonopin and remeron    HPI:  William [name] is a 54 y.o. male who presents to [facility] for a follow up.   Medication follow up: He wants to taper down his klonopin and remeron. He is currently taking klonopin 0.5 mg every other day, he wants to further increase the days between doses then eventually quit taking the klonopin.  I spent over 30 minutes face-to-face with patient discussing his family issues. In particular his mother lives 6 hours away and he is concerned that if she gets sick, there is no friend or relative near her.  I also discussed his interest in reevaluating whether he can work now that his migraines seem to be under good control. He's not having significant headaches and he is proceeding with tapering his medications successfully. This has been very stressful for him.   Rash: He also complains of a genital rash. He has had this before and resulted in discomfort when walking. The rash is associated with his exercising more and is characterized by irritation.  Past Medical History  Diagnosis Date  . Allergy   . Depression   . Migraine headache   . Anxiety   . Mitral valve prolapse   . Heart murmur     Home Meds: Prior to Admission medications   Medication Sig Start Date End Date Taking? Authorizing Provider  Cholecalciferol (VITAMIN D3) 50000 UNITS CAPS Take 1 capsule by mouth once a week. 09/12/14  Yes [name], MD  clonazePAM  (KLONOPIN) 0.5 MG tablet Take 1 tablet (0.5 mg total) by mouth daily as needed for anxiety. May take 1/2 tab.  Wait until December to fill 09/09/14  Yes [name], MD  lidocaine (LIDODERM) 5 % Place 1 patch onto the skin daily. Remove & Discard patch within 12 hours or as directed by MD 02/08/14  Yes [name], MD  mirtazapine (REMERON) 30 MG tablet TAKE ONE TABLET AT BEDTIME. 09/09/14  Yes [name], MD  Naproxen Sodium (ALEVE PO) Take by mouth.   Yes Historical Provider, MD  fluticasone (FLONASE) 50 MCG/ACT nasal spray Place 2 sprays into both nostrils daily. Patient not taking: Reported on 10/10/2014 09/09/14   [name], MD  metoCLOPramide (REGLAN) 5 MG tablet Take 5 mg by mouth every 6 (six) hours as needed for nausea.    Historical Provider, MD  promethazine (PHENERGAN) 12.5 MG tablet Take 1 tablet (12.5 mg total) by mouth every 8 (eight) hours as needed for nausea or vomiting. Patient not taking: Reported on 10/10/2014 09/09/14   [name], MD   Allergies:  Allergies  Allergen Reactions  . Erythromycin Nausea And Vomiting and Anaphylaxis  . Cephalexin Diarrhea and Nausea And Vomiting  . Cephalosporins Diarrhea and Nausea And Vomiting  . Compazine [Prochlorperazine Edisylate]     uncontrollable shake - tremor  . Hydrocodone-Acetaminophen Nausea And Vomiting  . Ketoconazole Itching  . Ketorolac Nausea And Vomiting  . Ketorolac Tromethamine  REACTION: Reaction not known  . Latex Itching    REACTION: Reaction not known  . Nalbuphine Other (See Comments)    States loss of consciousness REACTION: Reaction not known  . Oxycodone-Acetaminophen Other (See Comments) and Swelling  . Prednisone Nausea And Vomiting  . Prochlorperazine Edisylate     REACTION: shakes/tremors  . Topamax [Topiramate] Diarrhea and Nausea And Vomiting  . Tramadol Nausea And Vomiting  . Cefdinir Diarrhea, Nausea And Vomiting and Rash  . Neosporin [Neomycin-Bacitracin Zn-Polymyx] Rash     History   Social History  . Marital Status: Married    Spouse Name: N/A  . Number of Children: N/A  . Years of Education: N/A   Occupational History  . Not on file.   Social History Main Topics  . Smoking status: Never Smoker   . Smokeless tobacco: Not on file  . Alcohol Use: No  . Drug Use: No  . Sexual Activity:    Partners: Female   Other Topics Concern  . Not on file   Social History Narrative    Review of Systems: Constitutional: negative for chills, fever, night sweats, weight changes, or fatigue  HEENT: negative for vision changes, hearing loss, congestion, rhinorrhea, ST, epistaxis, or sinus pressure Cardiovascular: negative for chest pain or palpitations Respiratory: negative for hemoptysis, wheezing, shortness of breath, or cough Abdominal: negative for abdominal pain, nausea, vomiting, diarrhea, or constipation Dermatological: positive for rash. Neurologic: negative for headache, dizziness, or syncope All other systems reviewed and are otherwise negative with the exception to those above and in the HPI.  Physical Exam: Blood pressure 114/72, pulse 102, temperature 98.3 F (36.8 C), temperature source Oral, resp. rate 18, height 5' 11.5" (1.816 m), weight 143 lb (64.864 kg), SpO2 98 %., Body mass index is 19.67 kg/(m^2). General: Well developed, well nourished, in no acute distress. Head: Normocephalic, atraumatic, eyes without discharge, sclera non-icteric, nares are without discharge. Bilateral auditory canals clear, TM's are without perforation, pearly grey and translucent with reflective cone of light bilaterally. Oral cavity moist, posterior pharynx without exudate, erythema, peritonsillar abscess, or post nasal drip.  Neck: Supple. No thyromegaly. Full ROM. No lymphadenopathy. Lungs: Clear bilaterally to auscultation without wheezes, rales, or rhonchi. Breathing is unlabored. Heart: RRR with S1 S2. No murmurs, rubs, or gallops appreciated. Abdomen: Soft,  non-tender, non-distended with normoactive bowel sounds. No hepatomegaly. No rebound/guarding. No obvious abdominal masses. Msk:  Strength and tone normal for age. Extremities/Skin: Warm and dry. No clubbing or cyanosis. No edema. No rashes or suspicious lesions. Hyperpigmented and scaly skin around his groin  Neuro: Alert and oriented X 3. Moves all extremities spontaneously. Gait is normal. CNII-XII grossly in tact. Psych:  Responds to questions appropriately with a normal affect.     ASSESSMENT AND PLAN:  54 y.o. year old male with   This chart was scribed in my presence and reviewed by me personally.    ICD-9-CM ICD-10-CM   1. Migraine without aura and with status migrainosus, not intractable 346.12 G43.001 Ambulatory referral to Occupational Therapy  2. Tinea cruris 110.3 B35.6 clotrimazole-betamethasone (LOTRISONE) cream  3. Generalized anxiety disorder 300.02 F41.1    Signed, [name], MD 10/10/2014 9:44 AM

## 2014-10-10 NOTE — Patient Instructions (Addendum)
Tapering the clonazepam: Take every other day for 2 weeks, then every third day for 2 weeks, then every fourth day for 2 weeks, then try once a week for whole month and then discontinue  Tapering the Remeron: I think it's important to wait until you got every fourth day on the clonazepam before you attempt to taper Remeron. At that point please let me know and I will call in the prescription.

## 2014-10-18 ENCOUNTER — Ambulatory Visit (INDEPENDENT_AMBULATORY_CARE_PROVIDER_SITE_OTHER): Payer: Medicare Other | Admitting: Family Medicine

## 2014-10-18 ENCOUNTER — Encounter: Payer: Self-pay | Admitting: Family Medicine

## 2014-10-18 VITALS — BP 126/65 | HR 99 | Temp 98.4°F | Resp 16 | Ht 70.25 in | Wt 141.0 lb

## 2014-10-18 DIAGNOSIS — K3184 Gastroparesis: Secondary | ICD-10-CM | POA: Diagnosis not present

## 2014-10-18 DIAGNOSIS — G43809 Other migraine, not intractable, without status migrainosus: Secondary | ICD-10-CM | POA: Diagnosis not present

## 2014-10-18 DIAGNOSIS — F419 Anxiety disorder, unspecified: Secondary | ICD-10-CM | POA: Diagnosis not present

## 2014-10-18 NOTE — Patient Instructions (Signed)
Continue same regimen for migraines and gastroparesis for now. If any increase in headaches, or difficulty in weaning your medicines - let me know and we can refer you to other neurologist in [facility] if needed.  Return to the clinic or go to the nearest emergency room if any of your symptoms worsen or new symptoms occur.

## 2014-10-18 NOTE — Progress Notes (Signed)
Subjective:   This chart was scribed for Merri Ray, MD by Thea Alken, ED Scribe. This patient was seen in room 25 and the patient's care was started at 4:33 PM.   Patient ID: William [name], male    DOB: Mar 09, 1961, 54 y.o.   MRN: [id number]  HPI   Chief Complaint  Patient presents with  . Establish Care    former patient of Dr Joseph Art and per patient he wants you (Dr Carlota Raspberry) to be aware of his DX of gastroparesis  . Follow-up    migraine   HPI Comments: William [name] is a 54 y.o. male who presents to the [facility] for a follow up regarding migraines. He is a new patient t to me and was previously following Dr. Joseph Art. Most recently had a physical May 12. Per that note, he has a hx  Of migraine that were improved with decreased frequency and intensity. He has hx of generalized anxiety disorder and take klonopin and Remeron. Per note 8 days ago he had weaned klonopin 0.5 mg to every other day. He has also been on disability to due to HA but planned for occupational eval. Per may 12 visit he was interested in vocation or occupational visits since HA were diminishing. Outpatient rehab pending.    Pt has been disabled since 1986 due to migraine.Pt is looking for a neurologist and has been seen at Dr. Tressia Danas office in the past. He has tried Topamax in the past. Pt states he is managing migraines better. He tries to stay away from stressors, triggers and certain foods. Pt develops migraines weekly. He usually takes ibuprofen for migraines. Pt has been taking klonopin and remeron for anxiety which triggered migraines. Pt is weaning off of klonopin and takes medication every 2 days as of right now. He takes Remeron daily but plans to wean off medication once he finishes klonopin. He denies nausea and emesis.  Gastroparesis Pt was diagnosed in 2013 by Dr.Outlaw, his gastroneurologist, which was thought to be due to taking pain medication. States he  stopped taking oxycodone and takes Reglan as needed only, last used 6 months ago.   Patient Active Problem List   Diagnosis Date Noted  . Intractable chronic migraine without aura and without status migrainosus 12/15/2013  . Hyperlipidemia 04/04/2012  . MITRAL VALVE PROLAPSE 07/15/2009  . IRRITABLE BOWEL SYNDROME 07/15/2009   Past Medical History  Diagnosis Date  . Allergy   . Depression   . Migraine headache   . Anxiety   . Mitral valve prolapse   . Heart murmur    No past surgical history on file. Allergies  Allergen Reactions  . Erythromycin Nausea And Vomiting and Anaphylaxis  . Cephalexin Diarrhea and Nausea And Vomiting  . Cephalosporins Diarrhea and Nausea And Vomiting  . Compazine [Prochlorperazine Edisylate]     uncontrollable shake - tremor  . Hydrocodone-Acetaminophen Nausea And Vomiting  . Ketoconazole Itching  . Ketorolac Nausea And Vomiting  . Ketorolac Tromethamine     REACTION: Reaction not known  . Latex Itching    REACTION: Reaction not known  . Nalbuphine Other (See Comments)    States loss of consciousness REACTION: Reaction not known  . Oxycodone-Acetaminophen Other (See Comments) and Swelling  . Prednisone Nausea And Vomiting  . Prochlorperazine Edisylate     REACTION: shakes/tremors  . Topamax [Topiramate] Diarrhea and Nausea And Vomiting  . Tramadol Nausea And Vomiting  . Cefdinir Diarrhea, Nausea And Vomiting and Rash  .  Neosporin [Neomycin-Bacitracin Zn-Polymyx] Rash   Prior to Admission medications   Medication Sig Start Date End Date Taking? Authorizing Provider  Cholecalciferol (VITAMIN D3) 50000 UNITS CAPS Take 1 capsule by mouth once a week. 09/12/14  Yes [name], MD  clonazePAM (KLONOPIN) 0.5 MG tablet Take 1 tablet (0.5 mg total) by mouth daily as needed for anxiety. May take 1/2 tab.  Wait until December to fill 09/09/14  Yes [name], MD  clotrimazole-betamethasone (LOTRISONE) cream Apply 1 application topically 2 (two)  times daily. 10/10/14  Yes [name], MD  lidocaine (LIDODERM) 5 % Place 1 patch onto the skin daily. Remove & Discard patch within 12 hours or as directed by MD 02/08/14  Yes [name], MD  metoCLOPramide (REGLAN) 5 MG tablet Take 5 mg by mouth every 6 (six) hours as needed for nausea.   Yes Historical Provider, MD  mirtazapine (REMERON) 30 MG tablet TAKE ONE TABLET AT BEDTIME. 09/09/14  Yes [name], MD  Naproxen Sodium (ALEVE PO) Take by mouth.   Yes Historical Provider, MD  promethazine (PHENERGAN) 12.5 MG tablet Take 1 tablet (12.5 mg total) by mouth every 8 (eight) hours as needed for nausea or vomiting. 09/09/14  Yes [name], MD  fluticasone (FLONASE) 50 MCG/ACT nasal spray Place 2 sprays into both nostrils daily. Patient not taking: Reported on 10/10/2014 09/09/14   [name], MD   History   Social History  . Marital Status: Married    Spouse Name: N/A  . Number of Children: N/A  . Years of Education: N/A   Occupational History  . Not on file.   Social History Main Topics  . Smoking status: Never Smoker   . Smokeless tobacco: Not on file  . Alcohol Use: No  . Drug Use: No  . Sexual Activity:    Partners: Female   Other Topics Concern  . Not on file   Social History Narrative   Review of Systems  Gastrointestinal: Negative for nausea and vomiting.  Neurological: Positive for headaches.   Objective:   Physical Exam  Constitutional: He is oriented to person, place, and time. He appears well-developed and well-nourished. No distress.  HENT:  Head: Normocephalic and atraumatic.  Eyes: Conjunctivae and EOM are normal. Pupils are equal, round, and reactive to light. Right eye exhibits no nystagmus. Left eye exhibits no nystagmus.  Neck: Neck supple.  Cardiovascular: Normal rate, regular rhythm and normal heart sounds.  Exam reveals no gallop and no friction rub.   No murmur heard. Pulmonary/Chest: Effort normal and breath sounds normal. No  respiratory distress. He has no wheezes. He has no rales. He exhibits no tenderness.  Abdominal: Soft. Bowel sounds are normal. He exhibits no distension.  Musculoskeletal: Normal range of motion.  Neurological: He is alert and oriented to person, place, and time.  Normal finger nose. No pronator drift.  Negative Romberg normal heel to toe.   Skin: Skin is warm and dry.  Psychiatric: He has a normal mood and affect. His behavior is normal.  Nursing note and vitals reviewed.  Filed Vitals:   10/18/14 1553  BP: 126/65  Pulse: 99  Temp: 98.4 F (36.9 C)  TempSrc: Oral  Resp: 16  Height: 5' 10.25" (1.784 m)  Weight: 141 lb (63.957 kg)  SpO2: 99%    Assessment & Plan:   William [name] is a 54 y.o. male Gastroparesis  -Stable by report with rare use of Reglan. Side effects discussed of this medication but overall stable,  and followed by gastroenterology.  Other migraine without status migrainosus, not intractable  -Chronic persistent migraines by history with previous treatment by neurology. In his discussion has not had significant relief with multiple different types of treatments. Discussed that although stress, anxiety may be partial trigger, if he continues to have frequent migraines would recommend he be followed by neurologist locally. Since he would prefer a different office and previous would consider [place] [facility] or [place].RTC precautions.   No orders of the defined types were placed in this encounter.   Patient Instructions  Continue same regimen for migraines and gastroparesis for now. If any increase in headaches, or difficulty in weaning your medicines - let me know and we can refer you to other neurologist in [facility] if needed.  Return to the clinic or go to the nearest emergency room if any of your symptoms worsen or new symptoms occur.     I personally performed the services described in this documentation, which was scribed in my presence. The  recorded information has been reviewed and considered, and addended by me as needed.

## 2014-11-13 ENCOUNTER — Ambulatory Visit (INDEPENDENT_AMBULATORY_CARE_PROVIDER_SITE_OTHER): Payer: Medicare Other | Admitting: Family Medicine

## 2014-11-13 VITALS — BP 122/80 | HR 103 | Temp 98.3°F | Resp 18 | Ht 71.5 in | Wt 143.2 lb

## 2014-11-13 DIAGNOSIS — F411 Generalized anxiety disorder: Secondary | ICD-10-CM

## 2014-11-13 DIAGNOSIS — G8929 Other chronic pain: Secondary | ICD-10-CM

## 2014-11-13 DIAGNOSIS — R51 Headache: Secondary | ICD-10-CM

## 2014-11-13 MED ORDER — MIRTAZAPINE 30 MG PO TABS: ORAL_TABLET | ORAL | Status: DC

## 2014-11-13 MED ORDER — CLONAZEPAM 0.5 MG PO TABS: 0.5000 mg | ORAL_TABLET | Freq: Every day | ORAL | Status: DC | PRN

## 2014-11-13 NOTE — Progress Notes (Signed)
This chart was scribed for Dr. [name], MD by Erling Conte, Medical Scribe. This patient was seen in Room 13 and the patient's care was started at 8:28 AM.  Patient ID: William [name] MRN: [id number], DOB: [birth year], 54 y.o. Date of Encounter: 11/13/2014, 8:28 AM  Primary Physician: [name], MD  Chief Complaint:  Chief Complaint  Patient presents with   Referral    for physical therapy      HPI: 54 y.o. year old male with history below presents for a referral. Pt has a h/o chronic migraines and depression and has considered following up with a neurologist but is still unsure. He currently takes Remeron and Klonopin. He states he has been taking his medicine 2-3x per week. Pt states he would like to follow up again with Dr. Lieutenant Diego, pt states he saw him previously to help with the tension in his shoulders and upper back that results from his migraines. He denies any other complaints at this time.    Past Medical History  Diagnosis Date   Allergy    Depression    Migraine headache    Anxiety    Mitral valve prolapse    Heart murmur      Home Meds: Prior to Admission medications   Medication Sig Start Date End Date Taking? Authorizing Provider  Cholecalciferol (VITAMIN D3) 50000 UNITS CAPS Take 1 capsule by mouth once a week. 09/12/14  Yes [name], MD  clonazePAM (KLONOPIN) 0.5 MG tablet Take 1 tablet (0.5 mg total) by mouth daily as needed for anxiety. May take 1/2 tab.  Wait until December to fill 09/09/14  Yes [name], MD  clotrimazole-betamethasone (LOTRISONE) cream Apply 1 application topically 2 (two) times daily. 10/10/14  Yes [name], MD  fluticasone [facility]) 50 MCG/ACT nasal spray Place 2 sprays into both nostrils daily. 09/09/14  Yes [name], MD  lidocaine (LIDODERM) 5 % Place 1 patch onto the skin daily. Remove & Discard patch within 12 hours or as directed by MD 02/08/14  Yes [name], MD    metoCLOPramide (REGLAN) 5 MG tablet Take 5 mg by mouth every 6 (six) hours as needed for nausea.   Yes Historical Provider, MD  mirtazapine (REMERON) 30 MG tablet TAKE ONE TABLET AT BEDTIME. 09/09/14  Yes [name], MD  Naproxen Sodium (ALEVE PO) Take by mouth.   Yes Historical Provider, MD  promethazine (PHENERGAN) 12.5 MG tablet Take 1 tablet (12.5 mg total) by mouth every 8 (eight) hours as needed for nausea or vomiting. 09/09/14  Yes [name], MD    Allergies:  Allergies  Allergen Reactions   Erythromycin Nausea And Vomiting and Anaphylaxis   Cephalexin Diarrhea and Nausea And Vomiting   Cephalosporins Diarrhea and Nausea And Vomiting   Compazine [Prochlorperazine Edisylate]     uncontrollable shake - tremor   Hydrocodone-Acetaminophen Nausea And Vomiting   Ketoconazole Itching   Ketorolac Nausea And Vomiting   Ketorolac Tromethamine     REACTION: Reaction not known   Latex Itching    REACTION: Reaction not known   Nalbuphine Other (See Comments)    States loss of consciousness REACTION: Reaction not known   Oxycodone-Acetaminophen Other (See Comments) and Swelling   Prednisone Nausea And Vomiting   Prochlorperazine Edisylate     REACTION: shakes/tremors   Topamax [Topiramate] Diarrhea and Nausea And Vomiting   Tramadol Nausea And Vomiting   Cefdinir Diarrhea, Nausea And Vomiting and Rash   Neosporin [Neomycin-Bacitracin Zn-Polymyx] Rash  History   Social History   Marital Status: Married    Spouse Name: N/A   Number of Children: N/A   Years of Education: N/A   Occupational History   Not on file.   Social History Main Topics   Smoking status: Never Smoker    Smokeless tobacco: Not on file   Alcohol Use: No   Drug Use: No   Sexual Activity:    Partners: Female   Other Topics Concern   Not on file   Social History Narrative     Review of Systems: Constitutional: negative for chills, fever, night sweats, weight  changes, or fatigue  HEENT: negative for vision changes, hearing loss, congestion, rhinorrhea, ST, epistaxis, or sinus pressure Cardiovascular: negative for chest pain or palpitations Respiratory: negative for hemoptysis, wheezing, shortness of breath, or cough Abdominal: negative for abdominal pain, nausea, vomiting, diarrhea, or constipation Dermatological: negative for rash Neurologic: positive for headache. negative for  dizziness, or syncope All other systems reviewed and are otherwise negative with the exception to those above and in the HPI.   Physical Exam: Blood pressure 122/80, pulse 103, temperature 98.3 F (36.8 C), temperature source Oral, resp. rate 18, height 5' 11.5" (1.816 m), weight 143 lb 3.2 oz (64.955 kg), SpO2 98 %., Body mass index is 19.7 kg/(m^2). General: Well developed, well nourished, in no acute distress. Head: Normocephalic, atraumatic, eyes without discharge, sclera non-icteric, nares are without discharge. Bilateral auditory canals clear, TM's are without perforation, pearly grey and translucent with reflective cone of light bilaterally. Oral cavity moist, posterior pharynx without exudate, erythema, peritonsillar abscess, or post nasal drip.  Neck: Supple. No thyromegaly. Full ROM. No lymphadenopathy. Lungs: Clear bilaterally to auscultation without wheezes, rales, or rhonchi. Breathing is unlabored. Heart: RRR with S1 S2. No murmurs, rubs, or gallops appreciated. Abdomen: Soft, non-tender, non-distended with normoactive bowel sounds. No hepatomegaly. No rebound/guarding. No obvious abdominal masses. Msk:  Strength and tone normal for age. Extremities/Skin: Warm and dry. No clubbing or cyanosis. No edema. No rashes or suspicious lesions. Neuro: Alert and oriented X 3. Moves all extremities spontaneously. Gait is normal. CNII-XII grossly in tact. Psych:  Responds to questions appropriately with a normal affect.   Discussed anxiety, exercise and migraines face  to face for extended minutes  ASSESSMENT AND PLAN:  54 y.o. year old male with  This chart was scribed in my presence and reviewed by me personally.    ICD-9-CM ICD-10-CM   1. Chronic nonintractable headache, unspecified headache type 784.0 R51 clonazePAM (KLONOPIN) 0.5 MG tablet     Ambulatory referral to Physical Therapy  2. Anxiety state 300.00 F41.1 mirtazapine (REMERON) 30 MG tablet   We decided to put vocational rehabilitation on hold until patient is off of all of his medicines. Things are stressful with his mother and his needing to maintain his current medications at their current level.     Signed, [name], MD 11/13/2014 8:28 AM

## 2014-11-13 NOTE — Patient Instructions (Signed)
At this point I think it's important to just continue your medicine until things with your mom settle out. You've come long way.

## 2014-11-30 ENCOUNTER — Encounter: Payer: Self-pay | Admitting: Emergency Medicine

## 2014-11-30 ENCOUNTER — Ambulatory Visit (INDEPENDENT_AMBULATORY_CARE_PROVIDER_SITE_OTHER): Payer: Medicare Other | Admitting: Emergency Medicine

## 2014-11-30 ENCOUNTER — Ambulatory Visit (INDEPENDENT_AMBULATORY_CARE_PROVIDER_SITE_OTHER): Payer: Medicare Other

## 2014-11-30 VITALS — BP 112/75 | HR 89 | Temp 98.1°F | Resp 16 | Ht 71.0 in | Wt 145.0 lb

## 2014-11-30 DIAGNOSIS — S60011A Contusion of right thumb without damage to nail, initial encounter: Secondary | ICD-10-CM

## 2014-11-30 DIAGNOSIS — G44319 Acute post-traumatic headache, not intractable: Secondary | ICD-10-CM

## 2014-11-30 DIAGNOSIS — F411 Generalized anxiety disorder: Secondary | ICD-10-CM

## 2014-11-30 MED ORDER — MIRTAZAPINE 30 MG PO TABS: ORAL_TABLET | ORAL | Status: DC

## 2014-11-30 NOTE — Progress Notes (Addendum)
Patient ID: William [name], male   DOB: [birth year], 54 y.o.   MRN: [id number]    This chart was scribed for [name], MD by [facility], medical scribe at [facility].The patient was seen in exam room 22 and the patient's care was started at 10:51 AM.  Chief Complaint:  Chief Complaint  Patient presents with  . Hand Injury    right hand, injured it on Friday   HPI: William [name] is a 54 y.o. male who reports to [facility] today complaining of a right hand injury which occurred four days after a falling back and hitting his hand against a ceiling fan. Pt states he also did hit his head but denies loss of consciousness. He does have frequent headaches so he cannot relate his headaches to the head injury.   Past Medical History  Diagnosis Date  . Allergy   . Depression   . Migraine headache   . Anxiety   . Mitral valve prolapse   . Heart murmur    No past surgical history on file. History   Social History  . Marital Status: Married    Spouse Name: N/A  . Number of Children: N/A  . Years of Education: N/A   Social History Main Topics  . Smoking status: Never Smoker   . Smokeless tobacco: Not on file  . Alcohol Use: No  . Drug Use: No  . Sexual Activity:    Partners: Female   Other Topics Concern  . None   Social History Narrative   Family History  Problem Relation Age of Onset  . Hypertension Mother   . Hypertension Brother    Allergies  Allergen Reactions  . Erythromycin Nausea And Vomiting and Anaphylaxis  . Cephalexin Diarrhea and Nausea And Vomiting  . Cephalosporins Diarrhea and Nausea And Vomiting  . Compazine [Prochlorperazine Edisylate]     uncontrollable shake - tremor  . Hydrocodone-Acetaminophen Nausea And Vomiting  . Ketoconazole Itching  . Ketorolac Nausea And Vomiting  . Ketorolac Tromethamine     REACTION: Reaction not known  . Latex Itching    REACTION: Reaction not known  . Nalbuphine Other (See Comments)   States loss of consciousness REACTION: Reaction not known  . Oxycodone-Acetaminophen Other (See Comments) and Swelling  . Prednisone Nausea And Vomiting  . Prochlorperazine Edisylate     REACTION: shakes/tremors  . Topamax [Topiramate] Diarrhea and Nausea And Vomiting  . Tramadol Nausea And Vomiting  . Cefdinir Diarrhea, Nausea And Vomiting and Rash  . Neosporin [Neomycin-Bacitracin Zn-Polymyx] Rash   Prior to Admission medications   Medication Sig Start Date End Date Taking? Authorizing Provider  Cholecalciferol (VITAMIN D3) 50000 UNITS CAPS Take 1 capsule by mouth once a week. 09/12/14  Yes [name], MD  clonazePAM (KLONOPIN) 0.5 MG tablet Take 1 tablet (0.5 mg total) by mouth daily as needed for anxiety. May take 1/2 tab.  Wait until December to fill 11/13/14  Yes [name], MD  clotrimazole-betamethasone (LOTRISONE) cream Apply 1 application topically 2 (two) times daily. 10/10/14  Yes [name], MD  fluticasone [facility]) 50 MCG/ACT nasal spray Place 2 sprays into both nostrils daily. 09/09/14  Yes [name], MD  lidocaine (LIDODERM) 5 % Place 1 patch onto the skin daily. Remove & Discard patch within 12 hours or as directed by MD 02/08/14  Yes [name], MD  metoCLOPramide (REGLAN) 5 MG tablet Take 5 mg by mouth every 6 (six) hours as needed for nausea.  Yes Historical Provider, MD  mirtazapine (REMERON) 30 MG tablet TAKE ONE TABLET AT BEDTIME. 11/13/14  Yes [name], MD  Naproxen Sodium (ALEVE PO) Take by mouth.   Yes Historical Provider, MD  promethazine (PHENERGAN) 12.5 MG tablet Take 1 tablet (12.5 mg total) by mouth every 8 (eight) hours as needed for nausea or vomiting. 09/09/14  Yes [name], MD   ROS: The patient denies fevers, chills, night sweats, unintentional weight loss, chest pain, palpitations, wheezing, dyspnea on exertion, nausea, vomiting, abdominal pain, dysuria, hematuria, melena, numbness, weakness, or tingling.   All other  systems have been reviewed and were otherwise negative with the exception of those mentioned in the HPI and as above.    PHYSICAL EXAM: Filed Vitals:   11/30/14 1037  BP: 112/75  Pulse: 89  Temp: 98.1 F (36.7 C)  Resp: 16   Body mass index is 20.23 kg/(m^2).  General: Alert, no acute distress HEENT:  Normocephalic, atraumatic, oropharynx patent. Eye: [facility] Cardiovascular:  Regular rate and rhythm, no rubs murmurs or gallops.  No Carotid bruits, radial pulse intact. No pedal edema.  Respiratory: Clear to auscultation bilaterally.  No wheezes, rales, or rhonchi.  No cyanosis, no use of accessory musculature Abdominal: No organomegaly, abdomen is soft and non-tender, positive bowel sounds.  No masses. Musculoskeletal: Gait intact. No edema, tenderness Skin: No rashes. 1 x .25 cm blood blister on the right medial thumb but no other associated swelling. Neurologic: Facial musculature symmetric. Psychiatric: Patient acts appropriately throughout our interaction. Lymphatic: No cervical or submandibular lymphadenopathy Genitourinary/Anorectal: No acute findings  LABS: Results for orders placed or performed in visit on 29/79/89  BASIC METABOLIC PANEL WITH GFR  Result Value Ref Range   Sodium 138 135 - 145 mEq/L   Potassium 4.1 3.5 - 5.3 mEq/L   Chloride 103 96 - 112 mEq/L   CO2 24 19 - 32 mEq/L   Glucose, Bld 79 70 - 99 mg/dL   BUN 9 6 - 23 mg/dL   Creat 1.07 0.50 - 1.35 mg/dL   Calcium 9.6 8.4 - 10.5 mg/dL   GFR, Est African American >89 mL/min   GFR, Est Non African American 78 mL/min  CBC with Differential/Platelet  Result Value Ref Range   WBC 7.1 4.0 - 10.5 K/uL   RBC 5.00 4.22 - 5.81 MIL/uL   Hemoglobin 15.7 13.0 - 17.0 g/dL   HCT 44.3 39.0 - 52.0 %   MCV 88.6 78.0 - 100.0 fL   MCH 31.4 26.0 - 34.0 pg   MCHC 35.4 30.0 - 36.0 g/dL   RDW 13.0 11.5 - 15.5 %   Platelets 251 150 - 400 K/uL   MPV 10.2 8.6 - 12.4 fL   Neutrophils Relative % 69 43 - 77 %   Neutro  Abs 4.9 1.7 - 7.7 K/uL   Lymphocytes Relative 24 12 - 46 %   Lymphs Abs 1.7 0.7 - 4.0 K/uL   Monocytes Relative 6 3 - 12 %   Monocytes Absolute 0.4 0.1 - 1.0 K/uL   Eosinophils Relative 1 0 - 5 %   Eosinophils Absolute 0.1 0.0 - 0.7 K/uL   Basophils Relative 0 0 - 1 %   Basophils Absolute 0.0 0.0 - 0.1 K/uL   Smear Review Criteria for review not met   Lipid panel  Result Value Ref Range   Cholesterol 233 (H) 0 - 200 mg/dL   Triglycerides 84 <150 mg/dL   HDL 69 >=40 mg/dL   Total CHOL/HDL  Ratio 3.4 Ratio   VLDL 17 0 - 40 mg/dL   LDL Cholesterol 147 (H) 0 - 99 mg/dL  PSA  Result Value Ref Range   PSA 1.10 <=4.00 ng/mL  POCT urinalysis dipstick  Result Value Ref Range   Color, UA yellow    Clarity, UA clear    Glucose, UA neg    Bilirubin, UA neg    Ketones, UA neg    Spec Grav, UA 1.010    Blood, UA trace    pH, UA 6.5    Protein, UA neg    Urobilinogen, UA 0.2    Nitrite, UA neg    Leukocytes, UA Negative   IFOBT POC (occult bld, rslt in office)  Result Value Ref Range   IFOBT Negative    EKG/XRAY:   Primary read interpreted by Dr. Everlene Farrier at [facility]. No fracture seen   ASSESSMENT/PLAN: Patient has a mild headache which is not unusual for him. His neurological exam is normal. He has an area of blood blister formation over the base of the right thumb no fracture. He is up-to-date on his tetanus  Gross sideeffects, risk and benefits, and alternatives of medications d/w patient. Patient is aware that all medications have potential sideeffects and we are unable to predict every sideeffect or drug-drug interaction that may occur.    [name] 11/30/2014 10:42 AM

## 2014-12-09 ENCOUNTER — Other Ambulatory Visit: Payer: Self-pay | Admitting: Gastroenterology

## 2014-12-09 DIAGNOSIS — R1013 Epigastric pain: Secondary | ICD-10-CM

## 2014-12-15 ENCOUNTER — Ambulatory Visit
Admission: RE | Admit: 2014-12-15 | Discharge: 2014-12-15 | Disposition: A | Discharge status: Home / Self Care | Payer: Medicare Other | Source: Ambulatory Visit | Attending: Gastroenterology | Admitting: Gastroenterology

## 2014-12-15 DIAGNOSIS — R1013 Epigastric pain: Secondary | ICD-10-CM

## 2015-02-07 ENCOUNTER — Telehealth: Payer: Self-pay | Admitting: Family Medicine

## 2015-02-07 NOTE — Telephone Encounter (Signed)
LMOM THAT THE PATIENTS NEW APPOINTMENT TIME HAS BEEN CHANGED TO 04/11/15 AT 3:15

## 2015-02-16 ENCOUNTER — Ambulatory Visit (INDEPENDENT_AMBULATORY_CARE_PROVIDER_SITE_OTHER): Payer: Medicare Other | Admitting: Family Medicine

## 2015-02-16 VITALS — BP 120/80 | HR 86 | Temp 97.8°F | Resp 18 | Ht 71.0 in | Wt 151.8 lb

## 2015-02-16 DIAGNOSIS — R51 Headache: Secondary | ICD-10-CM | POA: Diagnosis not present

## 2015-02-16 DIAGNOSIS — B356 Tinea cruris: Secondary | ICD-10-CM | POA: Diagnosis not present

## 2015-02-16 DIAGNOSIS — G8929 Other chronic pain: Secondary | ICD-10-CM

## 2015-02-16 DIAGNOSIS — F411 Generalized anxiety disorder: Secondary | ICD-10-CM | POA: Diagnosis not present

## 2015-02-16 DIAGNOSIS — J0100 Acute maxillary sinusitis, unspecified: Secondary | ICD-10-CM | POA: Diagnosis not present

## 2015-02-16 MED ORDER — CLOTRIMAZOLE-BETAMETHASONE 1-0.05 % EX CREA: 1.0000 "application " | TOPICAL_CREAM | Freq: Two times a day (BID) | CUTANEOUS | Status: DC

## 2015-02-16 MED ORDER — MIRTAZAPINE 30 MG PO TABS: ORAL_TABLET | ORAL | Status: DC

## 2015-02-16 MED ORDER — FLUTICASONE PROPIONATE 50 MCG/ACT NA SUSP: 2.0000 | Freq: Every day | NASAL | Status: DC

## 2015-02-16 MED ORDER — CLONAZEPAM 0.5 MG PO TABS: 0.5000 mg | ORAL_TABLET | Freq: Every day | ORAL | Status: DC | PRN

## 2015-02-16 NOTE — Patient Instructions (Signed)
For the next 2-3 days, I would like you to take the clonazepam twice a day to get ahead of the headaches. I'm reordering Flonase because it looks like the headaches may be coming back from the allergies and nasal congestion

## 2015-02-16 NOTE — Progress Notes (Signed)
@[facility] @  This chart was scribed for William Haber, MD by William [name], ED Scribe. This patient was seen in room 13 and the patient's care was started at 9:43 AM.  Patient ID: William [name] MRN: [id number], DOB: Dec 24, 1960, 54 y.o. Date of Encounter: 02/16/2015, 9:43 AM  Primary Physician: William Haber, MD  Chief Complaint:  Chief Complaint  Patient presents with   Headache    Migraine x3 weeks   Rash    legs and arms, has had it before.    Medication Refill    Klonopin and Remeron    HPI: 54 y.o. year old male with history below presents with migraines for 2-3 weeks. Pt has been having daily migraines for the past 2 weeks with associated photophobia. He has been trying moderate exercise, ibuprofen and pain patch. He denies cough.   Pt also c/o a recurrent, itchy rash to bilateral upper legs and right forearm. He is needing a refill of lotrisone cream.   Pt is having trouble tapering off klonopin .5mg . He is needing a refill of klonopin and Remeron.   Past Medical History  Diagnosis Date   Allergy    Depression    Migraine headache    Anxiety    Mitral valve prolapse    Heart murmur      Home Meds: Prior to Admission medications   Medication Sig Start Date End Date Taking? Authorizing Provider  Cholecalciferol (VITAMIN D3) 50000 UNITS CAPS Take 1 capsule by mouth once a week. 09/12/14  Yes William Haber, MD  clonazePAM (KLONOPIN) 0.5 MG tablet Take 1 tablet (0.5 mg total) by mouth daily as needed for anxiety. May take 1/2 tab.  Wait until December to fill 11/13/14  Yes William Haber, MD  clotrimazole-betamethasone (LOTRISONE) cream Apply 1 application topically 2 (two) times daily. 10/10/14  Yes William Haber, MD  fluticasone [facility]) 50 MCG/ACT nasal spray Place 2 sprays into both nostrils daily. 09/09/14  Yes William Haber, MD  lidocaine (LIDODERM) 5 % Place 1 patch onto the skin daily. Remove & Discard patch within 12 hours or as directed by  MD 02/08/14  Yes William Haber, MD  metoCLOPramide (REGLAN) 5 MG tablet Take 5 mg by mouth every 6 (six) hours as needed for nausea.   Yes Historical Provider, MD  mirtazapine (REMERON) 30 MG tablet TAKE ONE TABLET AT BEDTIME. 11/30/14  Yes Darlyne Russian, MD  Naproxen Sodium (ALEVE PO) Take by mouth.   Yes Historical Provider, MD  promethazine (PHENERGAN) 12.5 MG tablet Take 1 tablet (12.5 mg total) by mouth every 8 (eight) hours as needed for nausea or vomiting. 09/09/14  Yes William Haber, MD    Allergies:  Allergies  Allergen Reactions   Erythromycin Nausea And Vomiting and Anaphylaxis   Cephalexin Diarrhea and Nausea And Vomiting   Cephalosporins Diarrhea and Nausea And Vomiting   Compazine [Prochlorperazine Edisylate]     uncontrollable shake - tremor   Hydrocodone-Acetaminophen Nausea And Vomiting   Ketoconazole Itching   Ketorolac Nausea And Vomiting   Ketorolac Tromethamine     REACTION: Reaction not known   Latex Itching    REACTION: Reaction not known   Nalbuphine Other (See Comments)    States loss of consciousness REACTION: Reaction not known   Oxycodone-Acetaminophen Other (See Comments) and Swelling   Prednisone Nausea And Vomiting   Prochlorperazine Edisylate     REACTION: shakes/tremors   Topamax [Topiramate] Diarrhea and Nausea And Vomiting   Tramadol Nausea And Vomiting   Cefdinir Diarrhea, Nausea  And Vomiting and Rash   Neosporin [Neomycin-Bacitracin Zn-Polymyx] Rash    Social History   Social History   Marital Status: Married    Spouse Name: N/A   Number of Children: N/A   Years of Education: N/A   Occupational History   Not on file.   Social History Main Topics   Smoking status: Never Smoker    Smokeless tobacco: Not on file   Alcohol Use: No   Drug Use: No   Sexual Activity:    Partners: Female   Other Topics Concern   Not on file   Social History Narrative     Review of Systems: Constitutional: negative  for chills, fever, night sweats, weight changes, or fatigue  HEENT: negative for vision changes, hearing loss, congestion, rhinorrhea, ST, epistaxis, or sinus pressure Cardiovascular: negative for chest pain or palpitations Respiratory: negative for hemoptysis, wheezing, shortness of breath, or cough Abdominal: negative for abdominal pain, nausea, vomiting, diarrhea, or constipation Dermatological: negative for rash Neurologic: negative for headache, dizziness, or syncope All other systems reviewed and are otherwise negative with the exception to those above and in the HPI.   Physical Exam: Blood pressure 120/80, pulse 86, temperature 97.8 F (36.6 C), temperature source Oral, resp. rate 18, height 5\' 11"  (1.803 m), weight 151 lb 12.8 oz (68.856 kg), SpO2 98 %., Body mass index is 21.18 kg/(m^2). General: Well developed, well nourished, in no acute distress. Head: Normocephalic, atraumatic, eyes without discharge, sclera non-icteric, nares are without discharge. Swollen turbinates bilaterally. Bilateral auditory canals clear, TM's are without perforation, pearly grey and translucent with reflective cone of light bilaterally. Oral cavity moist, posterior pharynx without exudate, erythema, peritonsillar abscess, or post nasal drip.  Neck: Supple. No thyromegaly. Full ROM. No lymphadenopathy. Lungs: Clear bilaterally to auscultation without wheezes, rales, or rhonchi. Breathing is unlabored. Heart: RRR with S1 S2. No murmurs, rubs, or gallops appreciated. Abdomen: Soft, non-tender, non-distended with normoactive bowel sounds. No hepatomegaly. No rebound/guarding. No obvious abdominal masses. Msk:  Strength and tone normal for age. Extremities/Skin: Warm and dry. No clubbing or cyanosis. No edema. No rashes or suspicious lesions. Neuro: Alert and oriented X 3. Moves all extremities spontaneously. Gait is normal. CNII-XII grossly in tact. Psych:  Responds to questions appropriately with a normal  affect.   ASSESSMENT AND PLAN:  54 y.o. year old male with recurrent migraine probably secondary to seasonal allergies This chart was scribed in my presence and reviewed by me personally.    ICD-9-CM ICD-10-CM   1. Chronic nonintractable headache, unspecified headache type 784.0 R51 clonazePAM (KLONOPIN) 0.5 MG tablet  2. Tinea cruris 110.3 B35.6 clotrimazole-betamethasone (LOTRISONE) cream  3. Subacute maxillary sinusitis 461.0 J01.00 fluticasone (FLONASE) 50 MCG/ACT nasal spray  4. Anxiety state 300.00 F41.1 mirtazapine (REMERON) 30 MG tablet     Signed, William Haber, MD   By signing my name below, I, [name], attest that this documentation has been prepared under the direction and in the presence of William Haber, MD.  Electronically Signed: Thea [name], ED Scribe. 02/16/2015. 9:52 AM.  Signed, William Haber, MD 02/16/2015 9:43 AM

## 2015-04-09 ENCOUNTER — Encounter: Payer: Self-pay | Admitting: Family Medicine

## 2015-04-09 ENCOUNTER — Ambulatory Visit (INDEPENDENT_AMBULATORY_CARE_PROVIDER_SITE_OTHER): Payer: Medicare Other | Admitting: Family Medicine

## 2015-04-09 VITALS — BP 112/60 | HR 90 | Temp 98.7°F | Resp 14 | Ht 71.0 in | Wt 154.0 lb

## 2015-04-09 DIAGNOSIS — L853 Xerosis cutis: Secondary | ICD-10-CM

## 2015-04-09 DIAGNOSIS — B356 Tinea cruris: Secondary | ICD-10-CM | POA: Diagnosis not present

## 2015-04-09 DIAGNOSIS — R42 Dizziness and giddiness: Secondary | ICD-10-CM

## 2015-04-09 LAB — CBC WITH DIFFERENTIAL/PLATELET
Basophils Absolute: 0 10*3/uL (ref 0.0–0.1)
Basophils Relative: 0 % (ref 0–1)
Eosinophils Absolute: 0.1 10*3/uL (ref 0.0–0.7)
Eosinophils Relative: 1 % (ref 0–5)
HCT: 45.5 % (ref 39.0–52.0)
Hemoglobin: 15.9 g/dL (ref 13.0–17.0)
Lymphocytes Relative: 27 % (ref 12–46)
Lymphs Abs: 2 10*3/uL (ref 0.7–4.0)
MCH: 31.6 pg (ref 26.0–34.0)
MCHC: 34.9 g/dL (ref 30.0–36.0)
MCV: 90.5 fL (ref 78.0–100.0)
MPV: 9.2 fL (ref 8.6–12.4)
Monocytes Absolute: 0.4 10*3/uL (ref 0.1–1.0)
Monocytes Relative: 6 % (ref 3–12)
Neutro Abs: 4.9 10*3/uL (ref 1.7–7.7)
Neutrophils Relative %: 66 % (ref 43–77)
Platelets: 257 10*3/uL (ref 150–400)
RBC: 5.03 MIL/uL (ref 4.22–5.81)
RDW: 13.2 % (ref 11.5–15.5)
WBC: 7.4 10*3/uL (ref 4.0–10.5)

## 2015-04-09 LAB — COMPLETE METABOLIC PANEL WITH GFR
ALT: 21 U/L (ref 9–46)
AST: 22 U/L (ref 10–35)
Albumin: 4.5 g/dL (ref 3.6–5.1)
Alkaline Phosphatase: 65 U/L (ref 40–115)
BUN: 12 mg/dL (ref 7–25)
CO2: 26 mmol/L (ref 20–31)
Calcium: 9.4 mg/dL (ref 8.6–10.3)
Chloride: 101 mmol/L (ref 98–110)
Creat: 1.14 mg/dL (ref 0.70–1.33)
GFR, Est African American: 83 mL/min (ref >=60)
GFR, Est Non African American: 72 mL/min (ref >=60)
Glucose, Bld: 83 mg/dL (ref 65–99)
Potassium: 4 mmol/L (ref 3.5–5.3)
Sodium: 136 mmol/L (ref 135–146)
Total Bilirubin: 1.1 mg/dL (ref 0.2–1.2)
Total Protein: 7.4 g/dL (ref 6.1–8.1)

## 2015-04-09 LAB — HEMOGLOBIN A1C: Hgb A1c MFr Bld: 5.2 % (ref 4.0–6.0)

## 2015-04-09 LAB — POCT GLYCOSYLATED HEMOGLOBIN (HGB A1C): Hemoglobin A1C: 5.2

## 2015-04-09 LAB — GLUCOSE, POCT (MANUAL RESULT ENTRY): POC Glucose: 82 mg/dl (ref 70–99)

## 2015-04-09 MED ORDER — TRIAMCINOLONE ACETONIDE 0.1 % EX CREA: 1.0000 "application " | TOPICAL_CREAM | Freq: Two times a day (BID) | CUTANEOUS | Status: DC

## 2015-04-09 MED ORDER — CLOTRIMAZOLE 1 % EX OINT: 1.0000 "application " | TOPICAL_OINTMENT | Freq: Two times a day (BID) | CUTANEOUS | Status: DC

## 2015-04-09 NOTE — Patient Instructions (Signed)
Were running a testosterone to see if that could be contributing to the weakness.  I like you to go back to [name] to get some help with the right hip are graph I would try to have chicken soup or Ramen noodles every day to help with the dizziness.

## 2015-04-09 NOTE — Progress Notes (Addendum)
Patient ID: William [name], male   DOB: 09-30-1960, 54 y.o.   MRN: [id number]   This chart was scribed for [name], MD by [facility], medical scribe at [facility].The patient was seen in exam room ** and the patient's care was started at 10:22 AM.  Patient ID: William [name] MRN: [id number], DOB: 1960/05/08, 54 y.o. Date of Encounter: 04/09/2015  Primary Physician: [name], MD  Chief Complaint:  Chief Complaint  Patient presents with  . Follow-up    Complains of dizziness, balance issues, headaches and right hip weakness.  . Rash on shoulder and thigh  . Dry skin  . Glucose check    HPI:  William [name] is a 54 y.o. male who presents to [facility] for multiple concerns.   Dizziness: Dizziness and imbalance. When he stands up he becomes dizzy. BP recheck is 100/70 sitting, 100/76 when standing.   Weakness: Occasionally when walking or standing his right hip gives out.   Rash: Also concerned about dry skin and a rash on his abdomen, upper and lower extremities, as well on his genitals. Ointment cream gives him a burning sensation. He would like to switch to another cream.  Stressed due to his mother who lives in [place].   Past Medical History  Diagnosis Date  . Allergy   . Depression   . Migraine headache   . Anxiety   . Mitral valve prolapse   . Heart murmur     Home Meds: Prior to Admission medications   Medication Sig Start Date End Date Taking? Authorizing Provider  clonazePAM (KLONOPIN) 0.5 MG tablet Take 1 tablet (0.5 mg total) by mouth daily as needed for anxiety. May take 1/2 tab.  Wait until December to fill 02/16/15  Yes [name], MD  fluticasone [facility]) 50 MCG/ACT nasal spray Place 2 sprays into both nostrils daily. 02/16/15  Yes [name], MD  lidocaine (LIDODERM) 5 % Place 1 patch onto the skin daily. Remove & Discard patch within 12 hours or as directed by MD  02/08/14  Yes [name], MD  metoCLOPramide (REGLAN) 5 MG tablet Take 5 mg by mouth every 6 (six) hours as needed for nausea.   Yes Historical Provider, MD  mirtazapine (REMERON) 30 MG tablet TAKE ONE TABLET AT BEDTIME. 02/16/15  Yes [name], MD  Naproxen Sodium (ALEVE PO) Take by mouth.   Yes Historical Provider, MD  promethazine (PHENERGAN) 12.5 MG tablet Take 1 tablet (12.5 mg total) by mouth every 8 (eight) hours as needed for nausea or vomiting. 09/09/14  Yes [name], MD  Cholecalciferol (VITAMIN D3) 50000 UNITS CAPS Take 1 capsule by mouth once a week. Patient not taking: Reported on 04/09/2015 09/12/14   [name], MD  clotrimazole-betamethasone (LOTRISONE) cream Apply 1 application topically 2 (two) times daily. Patient not taking: Reported on 04/09/2015 02/16/15   [name], MD   Allergies:  Allergies  Allergen Reactions  . Erythromycin Nausea And Vomiting and Anaphylaxis  . Cephalexin Diarrhea and Nausea And Vomiting  . Cephalosporins Diarrhea and Nausea And Vomiting  . Compazine [Prochlorperazine Edisylate]     uncontrollable shake - tremor  . Hydrocodone-Acetaminophen Nausea And Vomiting  . Ketoconazole Itching  . Ketorolac Nausea And Vomiting  . Ketorolac Tromethamine     REACTION: Reaction not known  . Latex Itching    REACTION: Reaction not known  . Nalbuphine Other (See Comments)    States loss of consciousness REACTION: Reaction  not known  . Oxycodone-Acetaminophen Other (See Comments) and Swelling  . Prednisone Nausea And Vomiting  . Prochlorperazine Edisylate     REACTION: shakes/tremors  . Topamax [Topiramate] Diarrhea and Nausea And Vomiting  . Tramadol Nausea And Vomiting  . Cefdinir Diarrhea, Nausea And Vomiting and Rash  . Neosporin [Neomycin-Bacitracin Zn-Polymyx] Rash    Social History   Social History  . Marital Status: Married    Spouse Name: N/A  . Number of Children: N/A  . Years of Education: N/A    Occupational History  . Not on file.   Social History Main Topics  . Smoking status: Never Smoker   . Smokeless tobacco: Not on file  . Alcohol Use: No  . Drug Use: No  . Sexual Activity:    Partners: Female   Other Topics Concern  . Not on file   Social History Narrative    Review of Systems: Constitutional: negative for chills, fever, night sweats, weight changes, or fatigue  HEENT: negative for vision changes, hearing loss, congestion, rhinorrhea, ST, epistaxis, or sinus pressure. Cardiovascular: negative for chest pain or palpitations Respiratory: negative for hemoptysis, wheezing, shortness of breath, or cough Abdominal: negative for abdominal pain, nausea, vomiting, diarrhea, or constipation Dermatological: Positive for rash Neurologic: negative for headache or syncope. Positive for dizziness, and weakness. All other systems reviewed and are otherwise negative with the exception to those above and in the HPI.  Physical Exam: Blood pressure 112/60, pulse 90, temperature 98.7 F (37.1 C), temperature source Oral, resp. rate 14, height 5\' 11"  (1.803 m), weight 154 lb (69.854 kg), SpO2 99 %., Body mass index is 21.49 kg/(m^2). General: Well developed, well nourished, in no acute distress. Head: Normocephalic, atraumatic, eyes without discharge, sclera non-icteric, nares are without discharge. Bilateral auditory canals clear, TM's are without perforation, pearly grey and translucent with reflective cone of light bilaterally. Oral cavity moist, posterior pharynx without exudate, erythema, peritonsillar abscess, or post nasal drip.  Neck: Supple. No thyromegaly. Full ROM. No lymphadenopathy. Lungs: Clear bilaterally to auscultation without wheezes, rales, or rhonchi. Breathing is unlabored. Heart: RRR with S1 S2. No murmurs, rubs, or gallops appreciated. Abdomen: Soft, non-tender, non-distended with normoactive bowel sounds. No hepatomegaly. No rebound/guarding. No obvious  abdominal masses. Msk:  Strength and tone normal for age. Extremities/Skin: Dry skin, erythema on the scrotum. Neuro: Alert and oriented X 3. Moves all extremities spontaneously. Gait is normal. CNII-XII grossly in tact. Psych:  Responds to questions appropriately with a normal affect.   Labs: Results for orders placed or performed in visit on 04/09/15  POCT glycosylated hemoglobin (Hb A1C)  Result Value Ref Range   Hemoglobin A1C 5.2   POCT glucose (manual entry)  Result Value Ref Range   POC Glucose 82 70 - 99 mg/dl   CBC done here is normal  ASSESSMENT AND PLAN:  54 y.o. year old male with  This chart was scribed in my presence and reviewed by me personally.    ICD-9-CM ICD-10-CM   1. Dizziness and giddiness 780.4 R42 POCT CBC     POCT glycosylated hemoglobin (Hb A1C)     POCT glucose (manual entry)     COMPLETE METABOLIC PANEL WITH GFR     CBC with Differential/Platelet  2. Xerosis cutis 706.8 L85.3 triamcinolone cream (KENALOG) 0.1 %     CBC with Differential/Platelet  3. Tinea cruris 110.3 B35.6 Clotrimazole 1 % OINT     CBC with Differential/Platelet     By signing my name below, I,  Nadim Abuhashem, attest that this documentation has been prepared under the direction and in the presence of [name], MD.  Electronically Signed: Lora Havens, medical scribe. 04/09/2015 9:36 AM.

## 2015-04-09 NOTE — Addendum Note (Signed)
Addended by: Lupe Carney on: 04/09/2015 10:25 AM   Modules accepted: Orders

## 2015-04-11 ENCOUNTER — Ambulatory Visit: Payer: Medicare Other | Admitting: Family Medicine

## 2015-04-11 LAB — TESTOSTERONE, FREE, TOTAL, SHBG
Sex Hormone Binding: 35 nmol/L (ref 10–50)
Testosterone, Free: 79.6 pg/mL (ref 47.0–244.0)
Testosterone-% Free: 2 % (ref 1.6–2.9)
Testosterone: 405 ng/dL (ref 300–890)

## 2015-04-12 ENCOUNTER — Ambulatory Visit (INDEPENDENT_AMBULATORY_CARE_PROVIDER_SITE_OTHER): Payer: Medicare Other

## 2015-04-12 ENCOUNTER — Ambulatory Visit (INDEPENDENT_AMBULATORY_CARE_PROVIDER_SITE_OTHER): Payer: Medicare Other | Admitting: Family Medicine

## 2015-04-12 VITALS — BP 112/70 | HR 85 | Temp 97.5°F | Resp 18 | Ht 71.0 in | Wt 155.2 lb

## 2015-04-12 DIAGNOSIS — M79644 Pain in right finger(s): Secondary | ICD-10-CM

## 2015-04-12 DIAGNOSIS — R519 Headache, unspecified: Secondary | ICD-10-CM

## 2015-04-12 DIAGNOSIS — S67192A Crushing injury of right middle finger, initial encounter: Secondary | ICD-10-CM

## 2015-04-12 DIAGNOSIS — R3589 Other polyuria: Secondary | ICD-10-CM

## 2015-04-12 DIAGNOSIS — R51 Headache: Secondary | ICD-10-CM | POA: Diagnosis not present

## 2015-04-12 DIAGNOSIS — G8929 Other chronic pain: Secondary | ICD-10-CM

## 2015-04-12 DIAGNOSIS — R358 Other polyuria: Secondary | ICD-10-CM

## 2015-04-12 DIAGNOSIS — S6710XA Crushing injury of unspecified finger(s), initial encounter: Secondary | ICD-10-CM

## 2015-04-12 LAB — POCT URINALYSIS DIP (MANUAL ENTRY)
Bilirubin, UA: NEGATIVE
Glucose, UA: NEGATIVE
Ketones, POC UA: NEGATIVE
Leukocytes, UA: NEGATIVE
Nitrite, UA: NEGATIVE
Protein Ur, POC: NEGATIVE
Spec Grav, UA: 1.01
Urobilinogen, UA: 0.2
pH, UA: 7

## 2015-04-12 LAB — POC MICROSCOPIC URINALYSIS (UMFC): Mucus: ABSENT

## 2015-04-12 MED ORDER — ZONISAMIDE 100 MG PO CAPS
100.0000 mg | ORAL_CAPSULE | Freq: Every day | ORAL | Status: DC
Start: 2015-04-12 — End: 2015-06-22

## 2015-04-12 NOTE — Patient Instructions (Signed)
Take Clonazepam 1/2 tab every night with the Zonisamide.  If headaches are controlled for three weeks, then take 1/2 clonazepam every other day for three weeks and then discontinue  Care for your finger just like nothing happened.

## 2015-04-12 NOTE — Progress Notes (Signed)
Procedure: Verbal consent obtained. Nail was cleaned with alcohol. 18 gauge needle used to drill hole at base of nail. Flash of blood produced. Pressure applied to express more blood. Symptomatic relief achieved. Covered with dressing.

## 2015-04-12 NOTE — Progress Notes (Addendum)
Patient Name: William [name] Date of Birth: 1960/12/21 Medical Record Number: [id number] Gender: male Date of Encounter: 04/12/2015  Chief Complaint: Finger Injury and Urinary Retention   History of Present Illness:  William [name] is a 54 y.o. very pleasant male patient who presents with the following:  Polyuria, Chronic. He forgot to tell me about this on his last visit 3 days ago. It keeps him awake at night sometimes.  Crush injury to his right middle finger. Yesterday finger was caught in the bathroom door when he was a little bit slow in responding. he's had pain all night and notices that there is a black and blue area at the base of the nail.  Chronic headaches. Patient continues to have chronic headaches. He thinks he's taking gabapentin in the past but he can't be sure. He's trying to get off the Klonopin because he says it makes him a little drowsy and makes concentration difficult.  Patient Active Problem List   Diagnosis Date Noted  . Hyperlipidemia 04/04/2012    Priority: Medium  . Intractable chronic migraine without aura and without status migrainosus 12/15/2013  . MITRAL VALVE PROLAPSE 07/15/2009  . IRRITABLE BOWEL SYNDROME 07/15/2009   Past Medical History  Diagnosis Date  . Allergy   . Depression   . Migraine headache   . Anxiety   . Mitral valve prolapse   . Heart murmur    History reviewed. No pertinent past surgical history. Social History  Substance Use Topics  . Smoking status: Never Smoker   . Smokeless tobacco: None  . Alcohol Use: No   Family History  Problem Relation Age of Onset  . Hypertension Mother   . Hypertension Brother    Allergies  Allergen Reactions  . Erythromycin Nausea And Vomiting and Anaphylaxis  . Cephalexin Diarrhea and Nausea And Vomiting  . Cephalosporins Diarrhea and Nausea And Vomiting  . Compazine [Prochlorperazine Edisylate]     uncontrollable shake - tremor  . Hydrocodone-Acetaminophen  Nausea And Vomiting  . Ketoconazole Itching  . Ketorolac Nausea And Vomiting  . Ketorolac Tromethamine     REACTION: Reaction not known  . Latex Itching    REACTION: Reaction not known  . Nalbuphine Other (See Comments)    States loss of consciousness REACTION: Reaction not known  . Oxycodone-Acetaminophen Other (See Comments) and Swelling  . Prednisone Nausea And Vomiting  . Prochlorperazine Edisylate     REACTION: shakes/tremors  . Topamax [Topiramate] Diarrhea and Nausea And Vomiting  . Tramadol Nausea And Vomiting  . Cefdinir Diarrhea, Nausea And Vomiting and Rash  . Neosporin [Neomycin-Bacitracin Zn-Polymyx] Rash    Medication list has been reviewed and updated.  Current Outpatient Prescriptions on File Prior to Visit  Medication Sig Dispense Refill  . clonazePAM (KLONOPIN) 0.5 MG tablet Take 1 tablet (0.5 mg total) by mouth daily as needed for anxiety. May take 1/2 tab.  Wait until December to fill 90 tablet 1  . Clotrimazole 1 % OINT Apply 1 application topically 2 (two) times daily. To groin 56 g 2  . fluticasone (FLONASE) 50 MCG/ACT nasal spray Place 2 sprays into both nostrils daily. 16 g 6  . lidocaine (LIDODERM) 5 % Place 1 patch onto the skin daily. Remove & Discard patch within 12 hours or as directed by MD 30 patch 0  . metoCLOPramide (REGLAN) 5 MG tablet Take 5 mg by mouth every 6 (six) hours as needed for nausea.    . mirtazapine (REMERON) 30 MG  tablet TAKE ONE TABLET AT BEDTIME. 90 tablet 3  . Naproxen Sodium (ALEVE PO) Take by mouth.    . promethazine (PHENERGAN) 12.5 MG tablet Take 1 tablet (12.5 mg total) by mouth every 8 (eight) hours as needed for nausea or vomiting. 30 tablet 0  . triamcinolone cream (KENALOG) 0.1 % Apply 1 application topically 2 (two) times daily. To arms and legs 453 g 1  . Cholecalciferol (VITAMIN D3) 50000 UNITS CAPS Take 1 capsule by mouth once a week. (Patient not taking: Reported on 04/09/2015) 15 capsule 1  .  clotrimazole-betamethasone (LOTRISONE) cream Apply 1 application topically 2 (two) times daily. (Patient not taking: Reported on 04/09/2015) 30 g 0   No current facility-administered medications on file prior to visit.    Review of Systems:    Physical Examination: Filed Vitals:   04/12/15 0819  BP: 112/70  Pulse: 85  Temp: 97.5 F (36.4 C)  Resp: 18   Filed Vitals:   04/12/15 0819  Height: 5\' 11"  (1.803 m)  Weight: 155 lb 3.2 oz (70.398 kg)   Body mass index is 21.66 kg/(m^2). Ideal Body Weight: Weight in (lb) to have BMI = 25: 178.9  Patient is seen with his wife. She's very concerned about his ongoing headache problem and would like to see if there is something we can do to get him back on the work force.  Patient has a subungual hematoma in the right middle finger, occupying one third of the proximal nail bed. He has good range of motion but he is tender diffusely in that distal phalanx.  Patient is alert and cooperative. He is oriented 3. Cranial nerves III through XII are intact. Gait is stable. He's moving 4 extremities equally.  Patient has no CVA tenderness EKG / Labs / Xrays: Negative right middle finger film  Results for orders placed or performed in visit on 04/12/15  POCT urinalysis dipstick  Result Value Ref Range   Color, UA yellow yellow   Clarity, UA clear clear   Glucose, UA negative negative   Bilirubin, UA negative negative   Ketones, POC UA negative negative   Spec Grav, UA 1.010    Blood, UA trace-intact (A) negative   pH, UA 7.0    Protein Ur, POC negative negative   Urobilinogen, UA 0.2    Nitrite, UA Negative Negative   Leukocytes, UA Negative Negative  POCT Microscopic Urinalysis ([facility])  Result Value Ref Range   WBC,UR,HPF,POC None None WBC/hpf   RBC,UR,HPF,POC None None RBC/hpf   Bacteria None None, Too numerous to count   Mucus Absent Absent   Epithelial Cells, UR Per Microscopy None None, Too numerous to count cells/hpf   I spent  35 minutes talking the patient and his wife about his headaches.  Please see PA note for subungual hematoma release Assessment and Plan: Crushed finger, initial encounter - Plan: DG Finger Middle Right  Polyuria - Plan: POCT urinalysis dipstick, POCT Microscopic Urinalysis ([facility])  Chronic intractable headache, unspecified headache type    [name], MD

## 2015-04-14 ENCOUNTER — Encounter: Payer: Self-pay | Admitting: Family Medicine

## 2015-05-14 ENCOUNTER — Ambulatory Visit (INDEPENDENT_AMBULATORY_CARE_PROVIDER_SITE_OTHER): Payer: PPO | Admitting: Family Medicine

## 2015-05-14 ENCOUNTER — Other Ambulatory Visit: Payer: Self-pay | Admitting: Family Medicine

## 2015-05-14 VITALS — BP 114/76 | HR 94 | Temp 97.5°F | Resp 14 | Ht 71.5 in | Wt 155.2 lb

## 2015-05-14 DIAGNOSIS — G8929 Other chronic pain: Secondary | ICD-10-CM

## 2015-05-14 DIAGNOSIS — Z7189 Other specified counseling: Secondary | ICD-10-CM | POA: Diagnosis not present

## 2015-05-14 DIAGNOSIS — Z7185 Encounter for immunization safety counseling: Secondary | ICD-10-CM

## 2015-05-14 DIAGNOSIS — R51 Headache: Secondary | ICD-10-CM | POA: Diagnosis not present

## 2015-05-14 DIAGNOSIS — M25511 Pain in right shoulder: Secondary | ICD-10-CM

## 2015-05-14 DIAGNOSIS — F411 Generalized anxiety disorder: Secondary | ICD-10-CM | POA: Diagnosis not present

## 2015-05-14 DIAGNOSIS — M542 Cervicalgia: Secondary | ICD-10-CM

## 2015-05-14 DIAGNOSIS — E559 Vitamin D deficiency, unspecified: Secondary | ICD-10-CM | POA: Diagnosis not present

## 2015-05-14 DIAGNOSIS — Z23 Encounter for immunization: Secondary | ICD-10-CM | POA: Diagnosis not present

## 2015-05-14 DIAGNOSIS — R519 Headache, unspecified: Secondary | ICD-10-CM

## 2015-05-14 LAB — HEPATITIS C ANTIBODY: HCV Ab: NEGATIVE

## 2015-05-14 MED ORDER — MIRTAZAPINE 30 MG PO TABS: ORAL_TABLET | ORAL | Status: DC

## 2015-05-14 MED ORDER — LIDOCAINE 5 % EX PTCH: 1.0000 | MEDICATED_PATCH | CUTANEOUS | Status: DC

## 2015-05-14 NOTE — Progress Notes (Signed)
Patient ID: William [name], male   DOB: 1961/03/26, 55 y.o.   MRN: [id number]   This chart was scribed for [name], MD by [facility], medical scribe at [facility].The patient was seen in exam room 01 and the patient's care was started at 9:19 AM.  Patient ID: William [name] MRN: [id number], DOB: [birth year], 55 y.o. Date of Encounter: 05/14/2015  Primary Physician: [name], MD  Chief Complaint:  Chief Complaint  Patient presents with   Follow-up    Migraines   Immunizations    Flu Vaccine if [facility]. Pt has some concerns   Finger Injury    3rd digit right hand   Other    FMLA forms   Medication Refill    Lidocaine Patch   Labs Only    Hep C, Vitamin D   HPI:  William [name] is a 55 y.o. male who presents to [facility] with multiple concerns.  Migraines: Having a headaches almost daily, it is either on the right side or the left side. He would like a note to perform moderate to strenuous  exercise. He is currently taking klonopin and wants to know if he can drop this down.  Back pain: While stretching last week he injured his back.   Lab work: He requests a Hep C test, and would like to check his vitamin D level and blood sugar.  Medication refill: He needs the lidocaine patch and Remeron  refilled.  Immunizations: He would like the flu shot today.  He would like the FMLA forms filled.   Past Medical History  Diagnosis Date   Allergy    Depression    Migraine headache    Anxiety    Mitral valve prolapse    Heart murmur     Home Meds: Prior to Admission medications   Medication Sig Start Date End Date Taking? Authorizing Provider  clonazePAM (KLONOPIN) 0.5 MG tablet Take 1 tablet (0.5 mg total) by mouth daily as needed for anxiety. May take 1/2 tab.  Wait until December to fill 02/16/15  Yes [name], MD  Clotrimazole 1 % OINT Apply 1 application topically 2 (two) times  daily. To groin 04/09/15  Yes [name], MD  lidocaine (LIDODERM) 5 % Place 1 patch onto the skin daily. Remove & Discard patch within 12 hours or as directed by MD 02/08/14  Yes [name], MD  metoCLOPramide (REGLAN) 5 MG tablet Take 5 mg by mouth every 6 (six) hours as needed for nausea.   Yes Historical Provider, MD  mirtazapine (REMERON) 30 MG tablet TAKE ONE TABLET AT BEDTIME. 02/16/15  Yes [name], MD  Naproxen Sodium (ALEVE PO) Take by mouth.   Yes Historical Provider, MD  promethazine (PHENERGAN) 12.5 MG tablet Take 1 tablet (12.5 mg total) by mouth every 8 (eight) hours as needed for nausea or vomiting. 09/09/14  Yes [name], MD  triamcinolone cream (KENALOG) 0.1 % Apply 1 application topically 2 (two) times daily. To arms and legs 04/09/15  Yes [name], MD  Cholecalciferol (VITAMIN D3) 50000 UNITS CAPS Take 1 capsule by mouth once a week. Patient not taking: Reported on 04/09/2015 09/12/14   [name], MD  clotrimazole-betamethasone (LOTRISONE) cream Apply 1 application topically 2 (two) times daily. Patient not taking: Reported on 04/09/2015 02/16/15   [name], MD  fluticasone [facility]) 50 MCG/ACT nasal spray Place 2 sprays into both nostrils daily. Patient not taking: Reported on 05/14/2015 02/16/15  [name], MD  zonisamide (ZONEGRAN) 100 MG capsule Take 1 capsule (100 mg total) by mouth at bedtime. Patient not taking: Reported on 05/14/2015 04/12/15   [name], MD   Allergies:  Allergies  Allergen Reactions   Erythromycin Nausea And Vomiting and Anaphylaxis   Cephalexin Diarrhea and Nausea And Vomiting   Cephalosporins Diarrhea and Nausea And Vomiting   Compazine [Prochlorperazine Edisylate]     uncontrollable shake - tremor   Hydrocodone-Acetaminophen Nausea And Vomiting   Ketoconazole Itching   Ketorolac Nausea And Vomiting   Ketorolac Tromethamine     REACTION: Reaction not known   Latex Itching     REACTION: Reaction not known   Nalbuphine Other (See Comments)    States loss of consciousness REACTION: Reaction not known   Oxycodone-Acetaminophen Other (See Comments) and Swelling   Prednisone Nausea And Vomiting   Prochlorperazine Edisylate     REACTION: shakes/tremors   Topamax [Topiramate] Diarrhea and Nausea And Vomiting   Tramadol Nausea And Vomiting   Cefdinir Diarrhea, Nausea And Vomiting and Rash   Neosporin [Neomycin-Bacitracin Zn-Polymyx] Rash   Social History   Social History   Marital Status: Married    Spouse Name: N/A   Number of Children: N/A   Years of Education: N/A   Occupational History   Not on file.   Social History Main Topics   Smoking status: Never Smoker    Smokeless tobacco: Not on file   Alcohol Use: No   Drug Use: No   Sexual Activity:    Partners: Female   Other Topics Concern   Not on file   Social History Narrative    Review of Systems: Constitutional: negative for chills, fever, night sweats, weight changes, or fatigue  HEENT: negative for vision changes, hearing loss, congestion, rhinorrhea, ST, epistaxis, or sinus pressure Cardiovascular: negative for chest pain or palpitations Respiratory: negative for hemoptysis, wheezing, shortness of breath, or cough Abdominal: negative for abdominal pain, nausea, vomiting, diarrhea, or constipation Dermatological: negative for rash. Msk: Positive for back pain. Neurologic: negative for dizziness, or syncope. Positive for headaches. All other systems reviewed and are otherwise negative with the exception to those above and in the HPI.  Physical Exam: Blood pressure 114/76, pulse 94, temperature 97.5 F (36.4 C), temperature source Oral, resp. rate 14, height 5' 11.5" (1.816 m), weight 155 lb 3.2 oz (70.398 kg), SpO2 98 %., Body mass index is 21.35 kg/(m^2). General: Well developed, well nourished, in no acute distress. Head: Normocephalic, atraumatic, eyes without  discharge, sclera non-icteric, nares are without discharge. Bilateral auditory canals clear, TM's are without perforation, pearly grey and translucent with reflective cone of light bilaterally. Oral cavity moist, posterior pharynx without exudate, erythema, peritonsillar abscess, or post nasal drip.  Neck: Supple. No thyromegaly. Full ROM. No lymphadenopathy. Lungs: Clear bilaterally to auscultation without wheezes, rales, or rhonchi. Breathing is unlabored. Heart: RRR with S1 S2. No murmurs, rubs, or gallops appreciated. Abdomen: Soft, non-tender, non-distended with normoactive bowel sounds. No hepatomegaly. No rebound/guarding. No obvious abdominal masses. Msk:  Strength and tone normal for age. Extremities/Skin: Warm and dry. No clubbing or cyanosis. No edema. No rashes or suspicious lesions. Neuro: Alert and oriented X 3. Moves all extremities spontaneously. Gait is normal. CNII-XII grossly in tact. Psych:  Responds to questions appropriately with a normal affect.   Labs: Results for orders placed or performed in visit on 04/14/15  Hemoglobin A1c  Result Value Ref Range   Hgb A1c MFr Bld 5.2 4.0 - 6.0 %  ASSESSMENT AND PLAN:  55 y.o. year old male with multiple problems as noted below  By signing my name below, I, Nadim Abuhashem, attest that this documentation has been prepared under the direction and in the presence of [name], MD.  Electronically Signed: Lora Havens, medical scribe. 05/14/2015 9:33 AM.    This chart was scribed in my presence and reviewed by me personally.    ICD-9-CM ICD-10-CM   1. Chronic intractable headache, unspecified headache type 784.0 R51 Hepatitis C antibody  2. Vitamin D deficiency 268.9 E55.9 VITAMIN D 25 Hydroxy (Vit-D Deficiency, Fractures)  3. Immunization counseling V65.49 Z71.89 Flu Vaccine QUAD 36+ mos IM  4. Neck pain on left side 723.1 M54.2 lidocaine (LIDODERM) 5 %  5. Pain in joint, shoulder region, right 719.41 M25.511  lidocaine (LIDODERM) 5 %  6. Anxiety state 300.00 F41.1 mirtazapine (REMERON) 30 MG tablet     Signed, [name], MD

## 2015-05-14 NOTE — Patient Instructions (Signed)
I think the exercise is the best strategy for managing the headaches.

## 2015-05-16 ENCOUNTER — Telehealth: Payer: Self-pay

## 2015-05-16 DIAGNOSIS — E785 Hyperlipidemia, unspecified: Secondary | ICD-10-CM

## 2015-05-16 LAB — VITAMIN D 25 HYDROXY (VIT D DEFICIENCY, FRACTURES): Vit D, 25-Hydroxy: 11 ng/mL — ABNORMAL LOW (ref 30–100)

## 2015-05-16 NOTE — Telephone Encounter (Signed)
Added

## 2015-05-16 NOTE — Telephone Encounter (Signed)
-----   Message from [name], MD sent at 05/15/2015  3:37 PM EST ----- Patient has abnormal lab values. Hepatitis test is normal. He is still low on his vitamin D and needs to continue this for the next 6 months.

## 2015-05-16 NOTE — Telephone Encounter (Signed)
Pt wants to know if we can add a lipid panel on to his labs.

## 2015-05-17 LAB — LIPID PANEL
Cholesterol: 221 mg/dL — ABNORMAL HIGH (ref 125–200)
HDL: 60 mg/dL (ref >=40)
LDL Cholesterol: 136 mg/dL — ABNORMAL HIGH (ref <130)
Total CHOL/HDL Ratio: 3.7 Ratio (ref <=5.0)
Triglycerides: 126 mg/dL (ref <150)
VLDL: 25 mg/dL (ref <30)

## 2015-05-19 ENCOUNTER — Telehealth: Payer: Self-pay

## 2015-05-19 NOTE — Telephone Encounter (Signed)
Patient wants someone to call him once the referal for [facility] has been completed.

## 2015-05-20 NOTE — Telephone Encounter (Signed)
Patient notified yesterday.  His referral has been resubmitted to [facility].

## 2015-05-23 ENCOUNTER — Ambulatory Visit (INDEPENDENT_AMBULATORY_CARE_PROVIDER_SITE_OTHER): Payer: PPO | Admitting: Family Medicine

## 2015-05-23 VITALS — BP 128/78 | HR 83 | Temp 97.6°F | Resp 16 | Ht 71.25 in | Wt 154.0 lb

## 2015-05-23 DIAGNOSIS — G8929 Other chronic pain: Secondary | ICD-10-CM | POA: Diagnosis not present

## 2015-05-23 DIAGNOSIS — R10A2 Flank pain, left side: Secondary | ICD-10-CM

## 2015-05-23 DIAGNOSIS — M545 Low back pain, unspecified: Secondary | ICD-10-CM

## 2015-05-23 DIAGNOSIS — R109 Unspecified abdominal pain: Secondary | ICD-10-CM | POA: Diagnosis not present

## 2015-05-23 LAB — POCT URINALYSIS DIP (MANUAL ENTRY)
Bilirubin, UA: NEGATIVE
Glucose, UA: NEGATIVE
Ketones, POC UA: NEGATIVE
Leukocytes, UA: NEGATIVE
Nitrite, UA: NEGATIVE
Protein Ur, POC: NEGATIVE
Spec Grav, UA: 1.01
Urobilinogen, UA: 0.2
pH, UA: 6

## 2015-05-23 LAB — POC MICROSCOPIC URINALYSIS (UMFC): Mucus: ABSENT

## 2015-05-23 MED ORDER — CYCLOBENZAPRINE HCL 5 MG PO TABS: 5.0000 mg | ORAL_TABLET | Freq: Two times a day (BID) | ORAL | Status: DC | PRN

## 2015-05-23 MED ORDER — MELOXICAM 7.5 MG PO TABS: 7.5000 mg | ORAL_TABLET | Freq: Every day | ORAL | Status: DC

## 2015-05-23 NOTE — Addendum Note (Signed)
Addended by: [name] on: 05/23/2015 09:24 AM   Modules accepted: Orders

## 2015-05-23 NOTE — Patient Instructions (Signed)
I have given you a non- steroidal and a muscle relaxer. The urine is completely normal. I went to to continue stretching.  If symptoms persist, return next week.

## 2015-05-23 NOTE — Progress Notes (Signed)
Subjective:  This chart was scribed for [name] MD, by Tamsen Roers, at [facility].  This patient was seen in room 8 and the patient's care was started at 8:55 AM.    Patient ID: William [name], male    DOB: Aug 03, 1960, 55 y.o.   MRN: [id number] Chief Complaint  Patient presents with  . Back Pain    left side x 3 weeks   . Nausea  . Headache  . Other    painful urination     HPI  HPI Comments: Jamarques Erich is a 55 y.o. male who presents to the [facility] complaining of back pain.   Patient notes that his back has been hurting since Christmas, but the pain is now excruciating.  This morning, patient also had dysuria.  He has started being active again yesterday and has been doing planks as well as walking but denies lifting any heavy weights or recent injury.  Patient assumed that walking and being more active would help his back but states that the pain has only gotten worse.  He would like to go see a specialist so he can have his back examined further. He has been using lidocaine patches but denies any relief. He denies any numbness.       Patient Active Problem List   Diagnosis Date Noted  . Intractable chronic migraine without aura and without status migrainosus 12/15/2013  . Hyperlipidemia 04/04/2012  . MITRAL VALVE PROLAPSE 07/15/2009  . IRRITABLE BOWEL SYNDROME 07/15/2009   Past Medical History  Diagnosis Date  . Allergy   . Depression   . Migraine headache   . Anxiety   . Mitral valve prolapse   . Heart murmur    History reviewed. No pertinent past surgical history. Allergies  Allergen Reactions  . Erythromycin Nausea And Vomiting and Anaphylaxis  . Cephalexin Diarrhea and Nausea And Vomiting  . Cephalosporins Diarrhea and Nausea And Vomiting  . Compazine [Prochlorperazine Edisylate]     uncontrollable shake - tremor  . Hydrocodone-Acetaminophen Nausea And Vomiting  . Ketoconazole Itching   . Ketorolac Nausea And Vomiting  . Ketorolac Tromethamine     REACTION: Reaction not known  . Latex Itching    REACTION: Reaction not known  . Nalbuphine Other (See Comments)    States loss of consciousness REACTION: Reaction not known  . Oxycodone-Acetaminophen Other (See Comments) and Swelling  . Prednisone Nausea And Vomiting  . Prochlorperazine Edisylate     REACTION: shakes/tremors  . Topamax [Topiramate] Diarrhea and Nausea And Vomiting  . Tramadol Nausea And Vomiting  . Cefdinir Diarrhea, Nausea And Vomiting and Rash  . Neosporin [Neomycin-Bacitracin Zn-Polymyx] Rash   Prior to Admission medications   Medication Sig Start Date End Date Taking? Authorizing Provider  clonazePAM (KLONOPIN) 0.5 MG tablet Take 1 tablet (0.5 mg total) by mouth daily as needed for anxiety. May take 1/2 tab.  Wait until December to fill 02/16/15  Yes [name], MD  lidocaine (LIDODERM) 5 % Place 1 patch onto the skin daily. Remove & Discard patch within 12 hours or as directed by MD 02/08/14  Yes [name], MD  metoCLOPramide (REGLAN) 5 MG tablet Take 5 mg by mouth every 6 (six) hours as needed for nausea.   Yes Historical Provider, MD  mirtazapine (REMERON) 30 MG tablet TAKE ONE TABLET AT BEDTIME. 05/14/15  Yes [name], MD  Naproxen Sodium (ALEVE PO) Take by mouth.   Yes Historical Provider,  MD  promethazine (PHENERGAN) 12.5 MG tablet Take 1 tablet (12.5 mg total) by mouth every 8 (eight) hours as needed for nausea or vomiting. 09/09/14  Yes [name], MD  Cholecalciferol (VITAMIN D3) 50000 UNITS CAPS Take 1 capsule by mouth once a week. Patient not taking: Reported on 04/09/2015 09/12/14   [name], MD  Clotrimazole 1 % OINT Apply 1 application topically 2 (two) times daily. To groin Patient not taking: Reported on 05/23/2015 04/09/15   [name], MD  clotrimazole-betamethasone (LOTRISONE) cream Apply 1 application topically 2 (two) times daily. Patient not taking:  Reported on 04/09/2015 02/16/15   [name], MD  fluticasone [facility]) 50 MCG/ACT nasal spray Place 2 sprays into both nostrils daily. Patient not taking: Reported on 05/14/2015 02/16/15   [name], MD  lidocaine (LIDODERM) 5 % Place 1 patch onto the skin daily. Remove & Discard patch within 12 hours or as directed by MD Patient not taking: Reported on 05/23/2015 05/14/15   [name], MD  triamcinolone cream (KENALOG) 0.1 % Apply 1 application topically 2 (two) times daily. To arms and legs Patient not taking: Reported on 05/23/2015 04/09/15   [name], MD  zonisamide (ZONEGRAN) 100 MG capsule Take 1 capsule (100 mg total) by mouth at bedtime. Patient not taking: Reported on 05/14/2015 04/12/15   [name], MD   Social History   Social History  . Marital Status: Married    Spouse Name: N/A  . Number of Children: N/A  . Years of Education: N/A   Occupational History  . Not on file.   Social History Main Topics  . Smoking status: Never Smoker   . Smokeless tobacco: Not on file  . Alcohol Use: No  . Drug Use: No  . Sexual Activity:    Partners: Female   Other Topics Concern  . Not on file   Social History Narrative     Review of Systems  Constitutional: Negative for fever and chills.  Eyes: Negative for pain, redness and itching.  Respiratory: Negative for cough, choking and shortness of breath.   Genitourinary: Positive for dysuria.  Neurological: Negative for syncope, speech difficulty and numbness.       Objective:   Physical Exam  Constitutional: He is oriented to person, place, and time. He appears well-developed and well-nourished. No distress.  HENT:  Head: Normocephalic and atraumatic.  Eyes: Pupils are equal, round, and reactive to light.  Pulmonary/Chest: Effort normal. No respiratory distress.  Musculoskeletal:  Negative straight leg raises Normal appearance, non tender, normal appearance.   Neurological: He is alert and  oriented to person, place, and time.  Skin: Skin is warm and dry.  Psychiatric: He has a normal mood and affect. His behavior is normal.    Filed Vitals:   05/23/15 0829  BP: 128/78  Pulse: 83  Temp: 97.6 F (36.4 C)  TempSrc: Oral  Resp: 16  Height: 5' 11.25" (1.81 m)  Weight: 154 lb (69.854 kg)  SpO2: 97%    Results for orders placed or performed in visit on 05/23/15  POCT urinalysis dipstick  Result Value Ref Range   Color, UA yellow yellow   Clarity, UA clear clear   Glucose, UA negative negative   Bilirubin, UA negative negative   Ketones, POC UA negative negative   Spec Grav, UA 1.010    Blood, UA small (A) negative   pH, UA 6.0    Protein Ur, POC negative negative   Urobilinogen, UA 0.2  Nitrite, UA Negative Negative   Leukocytes, UA Negative Negative  POCT Microscopic Urinalysis ([facility])  Result Value Ref Range   WBC,UR,HPF,POC None None WBC/hpf   RBC,UR,HPF,POC None None RBC/hpf   Bacteria None None, Too numerous to count   Mucus Absent Absent   Epithelial Cells, UR Per Microscopy Few (A) None, Too numerous to count cells/hpf        Assessment & Plan:   This chart was scribed in my presence and reviewed by me personally.    ICD-9-CM ICD-10-CM   1. Left flank pain 789.09 R10.9 POCT urinalysis dipstick     POCT Microscopic Urinalysis ([facility])     meloxicam (MOBIC) 7.5 MG tablet     cyclobenzaprine (FLEXERIL) 5 MG tablet  2. Chronic left-sided low back pain without sciatica 724.2 M54.5 POCT urinalysis dipstick   338.29 G89.29 POCT Microscopic Urinalysis ([facility])     meloxicam (MOBIC) 7.5 MG tablet     cyclobenzaprine (FLEXERIL) 5 MG tablet     Signed, [name], MD

## 2015-06-08 DIAGNOSIS — R51 Headache: Secondary | ICD-10-CM | POA: Diagnosis not present

## 2015-06-08 DIAGNOSIS — M5481 Occipital neuralgia: Secondary | ICD-10-CM | POA: Diagnosis not present

## 2015-06-10 DIAGNOSIS — M5481 Occipital neuralgia: Secondary | ICD-10-CM | POA: Diagnosis not present

## 2015-06-10 DIAGNOSIS — R51 Headache: Secondary | ICD-10-CM | POA: Diagnosis not present

## 2015-06-15 DIAGNOSIS — L298 Other pruritus: Secondary | ICD-10-CM | POA: Diagnosis not present

## 2015-06-15 DIAGNOSIS — L603 Nail dystrophy: Secondary | ICD-10-CM | POA: Diagnosis not present

## 2015-06-15 DIAGNOSIS — L820 Inflamed seborrheic keratosis: Secondary | ICD-10-CM | POA: Diagnosis not present

## 2015-06-22 ENCOUNTER — Ambulatory Visit (INDEPENDENT_AMBULATORY_CARE_PROVIDER_SITE_OTHER): Payer: PPO | Admitting: Family Medicine

## 2015-06-22 VITALS — BP 124/80 | HR 90 | Temp 98.3°F | Resp 17 | Ht 71.75 in | Wt 155.4 lb

## 2015-06-22 DIAGNOSIS — H578 Other specified disorders of eye and adnexa: Secondary | ICD-10-CM

## 2015-06-22 DIAGNOSIS — B356 Tinea cruris: Secondary | ICD-10-CM | POA: Diagnosis not present

## 2015-06-22 DIAGNOSIS — J111 Influenza due to unidentified influenza virus with other respiratory manifestations: Secondary | ICD-10-CM

## 2015-06-22 DIAGNOSIS — R6889 Other general symptoms and signs: Secondary | ICD-10-CM

## 2015-06-22 MED ORDER — OLOPATADINE HCL 0.1 % OP SOLN: 1.0000 [drp] | Freq: Two times a day (BID) | OPHTHALMIC | Status: DC

## 2015-06-22 MED ORDER — AZELASTINE HCL 0.1 % NA SOLN: 1.0000 | Freq: Two times a day (BID) | NASAL | Status: DC

## 2015-06-22 MED ORDER — CLOTRIMAZOLE-BETAMETHASONE 1-0.05 % EX CREA: 1.0000 "application " | TOPICAL_CREAM | Freq: Two times a day (BID) | CUTANEOUS | Status: DC

## 2015-06-22 NOTE — Patient Instructions (Signed)
use Robitussin CE for the cough  Wear boxers instead of underwear with elastic

## 2015-06-22 NOTE — Progress Notes (Signed)
By signing my name below, I, [name], attest that this documentation has been prepared under the direction and in the presence of [name], MD. Electronically Signed: [name], [place]. 06/22/2015 , 10:06 AM .  Patient was seen in room 9 .   Patient ID: William [name] MRN: [id number], DOB: 04/16/1961, 56 y.o. Date of Encounter: 06/22/2015  Primary Physician: [name], MD  Chief Complaint:  Chief Complaint  Patient presents with  . Nasal Congestion    pt feels he has the flu - wife also sick - symptoms x 5 days  . Cough  . Generalized Body Aches  . Sore Throat    HPI:  William [name] is a 55 y.o. male who presents to [facility] complaining of flu-like symptoms. Pt states that he went to a concert 5 nights ago with his wife. He started feeling sick the next morning with fever and chills. He notes more symptoms through the weekend including rhinorrhea, sore throat, itchy eyes, dry cough and clear fluids when coughing up and blowing his nose. He received a flu shot this year. His wife is also sick and was seen with the flu.   He also mentions having a rash in his groin.   Past Medical History  Diagnosis Date  . Allergy   . Depression   . Migraine headache   . Anxiety   . Mitral valve prolapse   . Heart murmur      Home Meds: Prior to Admission medications   Medication Sig Start Date End Date Taking? Authorizing Provider  clonazePAM (KLONOPIN) 0.5 MG tablet Take 1 tablet (0.5 mg total) by mouth daily as needed for anxiety. May take 1/2 tab.  Wait until December to fill 02/16/15  Yes [name], MD  lidocaine (LIDODERM) 5 % Place 1 patch onto the skin daily. Remove & Discard patch within 12 hours or as directed by MD 02/08/14  Yes [name], MD  mirtazapine (REMERON) 30 MG tablet TAKE ONE TABLET AT BEDTIME. 05/14/15  Yes [name], MD  Naproxen Sodium (ALEVE PO) Take by mouth.   Yes Historical Provider, MD   cyclobenzaprine (FLEXERIL) 5 MG tablet Take 1 tablet (5 mg total) by mouth 2 (two) times daily as needed for muscle spasms. Patient not taking: Reported on 06/22/2015 05/23/15   [name], MD    Allergies:  Allergies  Allergen Reactions  . Erythromycin Nausea And Vomiting and Anaphylaxis  . Cephalexin Diarrhea and Nausea And Vomiting  . Cephalosporins Diarrhea and Nausea And Vomiting  . Compazine [Prochlorperazine Edisylate]     uncontrollable shake - tremor  . Hydrocodone-Acetaminophen Nausea And Vomiting  . Ketoconazole Itching  . Ketorolac Nausea And Vomiting  . Ketorolac Tromethamine     REACTION: Reaction not known  . Latex Itching    REACTION: Reaction not known  . Nalbuphine Other (See Comments)    States loss of consciousness REACTION: Reaction not known  . Oxycodone-Acetaminophen Other (See Comments) and Swelling  . Prednisone Nausea And Vomiting  . Prochlorperazine Edisylate     REACTION: shakes/tremors  . Topamax [Topiramate] Diarrhea and Nausea And Vomiting  . Tramadol Nausea And Vomiting  . Cefdinir Diarrhea, Nausea And Vomiting and Rash  . Neosporin [Neomycin-Bacitracin Zn-Polymyx] Rash    Social History   Social History  . Marital Status: Married    Spouse Name: N/A  . Number of Children: N/A  . Years of Education: N/A   Occupational History  . Not on  file.   Social History Main Topics  . Smoking status: Never Smoker   . Smokeless tobacco: Not on file  . Alcohol Use: No  . Drug Use: No  . Sexual Activity:    Partners: Female   Other Topics Concern  . Not on file   Social History Narrative     Review of Systems: Constitutional: negative for night sweats, weight changes; positive for fever, chills, fatigue HEENT: negative for vision changes, hearing loss, congestion, epistaxis, or sinus pressure; positive for rhinorrhea, sore throat, eye itching Cardiovascular: negative for chest pain or palpitations Respiratory: negative for hemoptysis,  wheezing, shortness of breath; positive for cough Abdominal: negative for abdominal pain, nausea, vomiting, diarrhea, or constipation Dermatological: positive for rash Neurologic: negative for headache, dizziness, or syncope All other systems reviewed and are otherwise negative with the exception to those above and in the HPI.  Physical Exam: Blood pressure 124/80, pulse 90, temperature 98.3 F (36.8 C), temperature source Oral, resp. rate 17, height 5' 11.75" (1.822 m), weight 155 lb 6.4 oz (70.489 kg), SpO2 98 %., Body mass index is 21.23 kg/(m^2). General: Well developed, well nourished, in no acute distress. Head: Normocephalic, atraumatic, eyes without discharge, sclera non-icteric, nares are without discharge. Bilateral auditory canals clear, TM's are without perforation, pearly grey and translucent with reflective cone of light bilaterally. throat is mildly red Neck: Supple. No thyromegaly. Full ROM. No lymphadenopathy. Lungs: Clear bilaterally to auscultation without wheezes, rales, or rhonchi. Breathing is unlabored. Heart: RRR with S1 S2. No murmurs, rubs, or gallops appreciated. Msk:  Strength and tone normal for age. Extremities/Skin: Warm and dry. No clubbing or cyanosis. No edema. No rashes or suspicious lesions. GU: Erythema in groin Neuro: Alert and oriented X 3. Moves all extremities spontaneously. Gait is normal. CNII-XII grossly in tact. Psych:  Responds to questions appropriately with a normal affect.     ASSESSMENT AND PLAN:  55 y.o. year old male with  This chart was scribed in my presence and reviewed by me personally.    ICD-9-CM ICD-10-CM   1. Influenza with respiratory manifestation 487.1 J11.1 azelastine (ASTELIN) 0.1 % nasal spray  2. Tinea cruris 110.3 B35.6 clotrimazole-betamethasone (LOTRISONE) cream  3. Itchy eyes 379.99 H57.8 olopatadine (PATANOL) 0.1 % ophthalmic solution     Signed, [name], MD     Signed, [name],  MD 06/22/2015 10:12 AM

## 2015-06-23 ENCOUNTER — Telehealth: Payer: Self-pay

## 2015-06-23 NOTE — Telephone Encounter (Signed)
Dr. Carlean Jews  Please see previous message

## 2015-06-23 NOTE — Telephone Encounter (Signed)
Pt was seen on 2/22 by Dr. Joseph Art for Influenza with respiratory manifestation - Primary. He is still having a strong cough and would like to know if he could get a Hydrocodone cough syrup. He wants [place] to use Pharmacy:  [facility] CB # [phone]

## 2015-06-24 ENCOUNTER — Other Ambulatory Visit: Payer: Self-pay | Admitting: Family Medicine

## 2015-06-24 ENCOUNTER — Ambulatory Visit (INDEPENDENT_AMBULATORY_CARE_PROVIDER_SITE_OTHER): Payer: PPO | Admitting: Family Medicine

## 2015-06-24 VITALS — BP 118/66 | HR 104 | Temp 99.2°F | Resp 16 | Ht 70.5 in | Wt 158.0 lb

## 2015-06-24 DIAGNOSIS — R059 Cough, unspecified: Secondary | ICD-10-CM

## 2015-06-24 DIAGNOSIS — J111 Influenza due to unidentified influenza virus with other respiratory manifestations: Secondary | ICD-10-CM | POA: Diagnosis not present

## 2015-06-24 DIAGNOSIS — R05 Cough: Secondary | ICD-10-CM

## 2015-06-24 MED ORDER — ALBUTEROL SULFATE 108 (90 BASE) MCG/ACT IN AEPB: 2.0000 | INHALATION_SPRAY | Freq: Four times a day (QID) | RESPIRATORY_TRACT | Status: DC | PRN

## 2015-06-24 MED ORDER — ALBUTEROL SULFATE (2.5 MG/3ML) 0.083% IN NEBU
2.5000 mg | INHALATION_SOLUTION | Freq: Once | RESPIRATORY_TRACT | Status: AC
  Administered 2015-06-24: 2.5 mg via RESPIRATORY_TRACT

## 2015-06-24 MED ORDER — OSELTAMIVIR PHOSPHATE 75 MG PO CAPS: 75.0000 mg | ORAL_CAPSULE | Freq: Two times a day (BID) | ORAL | Status: DC

## 2015-06-24 MED ORDER — HYDROCODONE-HOMATROPINE 5-1.5 MG/5ML PO SYRP: 5.0000 mL | ORAL_SOLUTION | Freq: Three times a day (TID) | ORAL | Status: DC | PRN

## 2015-06-24 NOTE — Patient Instructions (Signed)

## 2015-06-24 NOTE — Telephone Encounter (Signed)
Called pt and notified ready. He reported that he is feeling worse and he agreed to RTC to be re-checked this afternoon.

## 2015-06-24 NOTE — Progress Notes (Addendum)
@UMFCLOGO @  By signing my name below, I, [name], attest that this documentation has been prepared under the direction and in the presence of [name], MD.  Electronically Signed: Thea Alken, ED Scribe. 06/24/2015. 2:11 PM.  Patient ID: William [name] MRN: [id number], DOB: [birth year], 55 y.o. Date of Encounter: 06/24/2015, 2:11 PM  Primary Physician: [name], MD  Chief Complaint:  Chief Complaint  Patient presents with  . Follow-up    Flu ,     HPI: 55 y.o. year old male with history below presents with follow up. Pt was seen here yesterday with cough, sore throat and rhinorrhea. he was prescribed nasal spray and eye drops. He did not find relief with nasal spray but does reports improvement of itchy eyes after using the drops. Pt reports worsening symptoms today of CP with cough, HA, rhinorrhea, sneezing , discolored phlegm with cough and overall feel worse than he did yesterday. .    Past Medical History  Diagnosis Date  . Allergy   . Depression   . Migraine headache   . Anxiety   . Mitral valve prolapse   . Heart murmur      Home Meds: Prior to Admission medications   Medication Sig Start Date End Date Taking? Authorizing Provider  azelastine (ASTELIN) 0.1 % nasal spray Place 1 spray into both nostrils 2 (two) times daily. Use in each nostril as directed 06/22/15  Yes [name], MD  clonazePAM (KLONOPIN) 0.5 MG tablet Take 1 tablet (0.5 mg total) by mouth daily as needed for anxiety. May take 1/2 tab.  Wait until December to fill 02/16/15  Yes [name], MD  clotrimazole-betamethasone (LOTRISONE) cream Apply 1 application topically 2 (two) times daily. 06/22/15  Yes [name], MD  HYDROcodone-homatropine [facility]) 5-1.5 MG/5ML syrup Take 5 mLs by mouth every 8 (eight) hours as needed for cough. 06/24/15  Yes [name], MD  lidocaine (LIDODERM) 5 % Place 1 patch onto the skin daily. Remove & Discard patch within 12 hours or as  directed by MD 02/08/14  Yes [name], MD  mirtazapine (REMERON) 30 MG tablet TAKE ONE TABLET AT BEDTIME. 05/14/15  Yes [name], MD  Naproxen Sodium (ALEVE PO) Take by mouth.   Yes Historical Provider, MD  cyclobenzaprine (FLEXERIL) 5 MG tablet Take 1 tablet (5 mg total) by mouth 2 (two) times daily as needed for muscle spasms. Patient not taking: Reported on 06/22/2015 05/23/15   [name], MD  olopatadine (PATANOL) 0.1 % ophthalmic solution Place 1 drop into both eyes 2 (two) times daily. 06/22/15   [name], MD    Allergies:  Allergies  Allergen Reactions  . Erythromycin Nausea And Vomiting and Anaphylaxis  . Cephalexin Diarrhea and Nausea And Vomiting  . Cephalosporins Diarrhea and Nausea And Vomiting  . Compazine [Prochlorperazine Edisylate]     uncontrollable shake - tremor  . Hydrocodone-Acetaminophen Nausea And Vomiting  . Ketoconazole Itching  . Ketorolac Nausea And Vomiting  . Ketorolac Tromethamine     REACTION: Reaction not known  . Latex Itching    REACTION: Reaction not known  . Nalbuphine Other (See Comments)    States loss of consciousness REACTION: Reaction not known  . Oxycodone-Acetaminophen Other (See Comments) and Swelling  . Prednisone Nausea And Vomiting  . Prochlorperazine Edisylate     REACTION: shakes/tremors  . Topamax [Topiramate] Diarrhea and Nausea And Vomiting  . Tramadol Nausea And Vomiting  . Cefdinir Diarrhea, Nausea And Vomiting and Rash  . Neosporin [Neomycin-Bacitracin Zn-Polymyx]  Rash    Social History   Social History  . Marital Status: Married    Spouse Name: N/A  . Number of Children: N/A  . Years of Education: N/A   Occupational History  . Not on file.   Social History Main Topics  . Smoking status: Never Smoker   . Smokeless tobacco: Not on file  . Alcohol Use: No  . Drug Use: No  . Sexual Activity:    Partners: Female   Other Topics Concern  . Not on file   Social History Narrative     Review of Systems: Constitutional: negative for night sweats, weight changes, or fatigue  HEENT: negative for vision changes, hearing loss, ST, epistaxis, or sinus pressure Cardiovascular: negative for  palpitations Respiratory: negative for hemoptysis, wheezing, shortness of breath. Abdominal: negative for abdominal pain, nausea, vomiting, diarrhea, or constipation Dermatological: negative for rash Neurologic: negative for dizziness, or syncope All other systems reviewed and are otherwise negative with the exception to those above and in the HPI.   Physical Exam:  Patient appears acutely ill Blood pressure 118/66, pulse 104, temperature 99.2 F (37.3 C), temperature source Oral, resp. rate 16, height 5' 10.5" (1.791 m), weight 158 lb (71.668 kg), SpO2 98 %., Body mass index is 22.34 kg/(m^2). General: Well developed, well nourished, in no acute distress. Head: Normocephalic, atraumatic, eyes without discharge, sclera non-icteric, nares are without discharge. Bilateral auditory canals clear, TM's are without perforation, pearly grey and translucent with reflective cone of light bilaterally. Oral cavity moist, posterior pharynx without exudate, erythema, peritonsillar abscess, or post nasal drip.  Neck: Supple. No thyromegaly. Full ROM. No lymphadenopathy. Lungs: A few faint rales heard bilaterally Heart: RRR with S1 S2. No murmurs, rubs, or gallops appreciated. Abdomen: Soft, non-tender, non-distended with normoactive bowel sounds. No hepatomegaly. No rebound/guarding. No obvious abdominal masses. Msk:  Strength and tone normal for age. Extremities/Skin: Warm and dry. No clubbing or cyanosis. No edema. No rashes or suspicious lesions. Neuro: Alert and oriented X 3. Moves all extremities spontaneously. Gait is normal. CNII-XII grossly in tact. Psych:  Responds to questions appropriately with a normal affect.    ASSESSMENT AND PLAN:  55 y.o. year old male with  This chart was scribed in  my presence and reviewed by me personally.    ICD-9-CM ICD-10-CM   1. Influenza with respiratory manifestation 487.1 J11.1 oseltamivir (TAMIFLU) 75 MG capsule     albuterol (PROVENTIL) (2.5 MG/3ML) 0.083% nebulizer solution 2.5 mg     Albuterol Sulfate ([place]) 123XX123 (90 Base) MCG/ACT AEPB      Signed, [name], MD 06/24/2015 2:11 PM

## 2015-06-28 ENCOUNTER — Ambulatory Visit (INDEPENDENT_AMBULATORY_CARE_PROVIDER_SITE_OTHER): Payer: PPO

## 2015-06-28 ENCOUNTER — Ambulatory Visit (INDEPENDENT_AMBULATORY_CARE_PROVIDER_SITE_OTHER): Payer: PPO | Admitting: Emergency Medicine

## 2015-06-28 VITALS — BP 120/76 | HR 85 | Temp 98.2°F | Resp 20 | Ht 71.26 in | Wt 155.6 lb

## 2015-06-28 DIAGNOSIS — R05 Cough: Secondary | ICD-10-CM | POA: Diagnosis not present

## 2015-06-28 DIAGNOSIS — R059 Cough, unspecified: Secondary | ICD-10-CM

## 2015-06-28 DIAGNOSIS — R0602 Shortness of breath: Secondary | ICD-10-CM | POA: Diagnosis not present

## 2015-06-28 DIAGNOSIS — J111 Influenza due to unidentified influenza virus with other respiratory manifestations: Secondary | ICD-10-CM

## 2015-06-28 LAB — POCT CBC
Granulocyte percent: 68.8 %G (ref 37–80)
HEMATOCRIT: 43.8 % (ref 43.5–53.7)
HEMOGLOBIN: 15.4 g/dL (ref 14.1–18.1)
Lymph, poc: 1.8 (ref 0.6–3.4)
MCH, POC: 32.3 pg — AB (ref 27–31.2)
MCHC: 35.1 g/dL (ref 31.8–35.4)
MCV: 91.9 fL (ref 80–97)
MID (CBC): 0.1 (ref 0–0.9)
MPV: 7.1 fL (ref 0–99.8)
POC Granulocyte: 4.3 (ref 2–6.9)
POC LYMPH %: 29.1 % (ref 10–50)
POC MID %: 2.1 %M (ref 0–12)
Platelet Count, POC: 197 10*3/uL (ref 142–424)
RBC: 4.76 M/uL (ref 4.69–6.13)
RDW, POC: 12.9 %
WBC: 6.3 10*3/uL (ref 4.6–10.2)

## 2015-06-28 MED ORDER — LEVOFLOXACIN 500 MG PO TABS: 500.0000 mg | ORAL_TABLET | Freq: Every day | ORAL | Status: DC

## 2015-06-28 NOTE — Progress Notes (Signed)
Patient ID: William [name], male   DOB: Dec 08, 1960, 55 y.o.   MRN: [id number]     By signing my name below, I, Zola Button, attest that this documentation has been prepared under the direction and in the presence of Arlyss Queen, MD.  Electronically Signed: Zola Button, Medical Scribe. 06/28/2015. 1:16 PM.   Chief Complaint:  Chief Complaint  Patient presents with  . Influenza    x 10 days    HPI: William [name] is a 55 y.o. male who reports to [facility] today complaining of gradual onset, productive cough of thick, yellow sputum that started about 10 days ago. The cough was initially productive of clear sputum. He also reports having generalized myalgias and chills. Patient was seen by Dr. Joseph Art on 2/22 and 2/24 for this. He had been treated with Tamiflu and albuterol, and he still has 2 of the Tamiflu left. He has been drinking water and Pediolyte. Patient reports having lightheadedness and nausea, which he believes may be due to a medication. He did receive the flu shot this year.  Past Medical History  Diagnosis Date  . Allergy   . Depression   . Migraine headache   . Anxiety   . Mitral valve prolapse   . Heart murmur    No past surgical history on file. Social History   Social History  . Marital Status: Married    Spouse Name: William [name]  . Number of Children: William [name]  . Years of Education: William [name]   Social History Main Topics  . Smoking status: Never Smoker   . Smokeless tobacco: None  . Alcohol Use: No  . Drug Use: No  . Sexual Activity:    Partners: Female   Other Topics Concern  . None   Social History Narrative   Family History  Problem Relation Age of Onset  . Hypertension Mother   . Hypertension Brother    Allergies  Allergen Reactions  . Erythromycin Nausea And Vomiting and Anaphylaxis  . Cephalexin Diarrhea and Nausea And Vomiting  . Cephalosporins Diarrhea and Nausea And Vomiting  . Compazine [Prochlorperazine Edisylate]     uncontrollable shake  - tremor  . Hydrocodone-Acetaminophen Nausea And Vomiting  . Ketoconazole Itching  . Ketorolac Nausea And Vomiting  . Ketorolac Tromethamine     REACTION: Reaction not known  . Latex Itching    REACTION: Reaction not known  . Nalbuphine Other (See Comments)    States loss of consciousness REACTION: Reaction not known  . Oxycodone-Acetaminophen Other (See Comments) and Swelling  . Prednisone Nausea And Vomiting  . Prochlorperazine Edisylate     REACTION: shakes/tremors  . Topamax [Topiramate] Diarrhea and Nausea And Vomiting  . Tramadol Nausea And Vomiting  . Cefdinir Diarrhea, Nausea And Vomiting and Rash  . Neosporin [Neomycin-Bacitracin Zn-Polymyx] Rash   Prior to Admission medications   Medication Sig Start Date End Date Taking? Authorizing Provider  azelastine (ASTELIN) 0.1 % nasal spray Place 1 spray into both nostrils 2 (two) times daily. Use in each nostril as directed 06/22/15  Yes [name], MD  clonazePAM (KLONOPIN) 0.5 MG tablet Take 1 tablet (0.5 mg total) by mouth daily as needed for anxiety. May take 1/2 tab.  Wait until December to fill 02/16/15  Yes [name], MD  clotrimazole-betamethasone (LOTRISONE) cream Apply 1 application topically 2 (two) times daily. 06/22/15  Yes [name], MD  lidocaine (LIDODERM) 5 % Place 1 patch onto the skin daily. Remove & Discard patch within 12 hours or as  directed by MD 02/08/14  Yes [name], MD  mirtazapine (REMERON) 30 MG tablet TAKE ONE TABLET AT BEDTIME. 05/14/15  Yes [name], MD  Naproxen Sodium (ALEVE PO) Take by mouth.   Yes Historical Provider, MD  olopatadine (PATANOL) 0.1 % ophthalmic solution Place 1 drop into both eyes 2 (two) times daily. 06/22/15  Yes [name], MD  oseltamivir (TAMIFLU) 75 MG capsule Take 1 capsule (75 mg total) by mouth 2 (two) times daily. 06/24/15  Yes [name], MD  Albuterol Sulfate (PROAIR [facility]ICL[place]) 123XX123 (90 Base) MCG/ACT AEPB Inhale 2 puffs into the lungs  every 6 (six) hours as needed. Patient not taking: Reported on 06/28/2015 06/24/15   [name], MD  cyclobenzaprine (FLEXERIL) 5 MG tablet Take 1 tablet (5 mg total) by mouth 2 (two) times daily as needed for muscle spasms. Patient not taking: Reported on 06/22/2015 05/23/15   [name], MD     ROS: The patient denies unintentional weight loss, chest pain, palpitations, wheezing, dyspnea on exertion, vomiting, abdominal pain, dysuria, hematuria, melena, numbness, weakness, or tingling.   All other systems have been reviewed and were otherwise negative with the exception of those mentioned in the HPI and as above.    PHYSICAL EXAM: Filed Vitals:   06/28/15 1226  BP: 120/76  Pulse: 85  Temp: 98.2 F (36.8 C)  Resp: 20   Body mass index is 21.54 kg/(m^2).   General: Alert, no acute distress HEENT:  Normocephalic, atraumatic, oropharynx patent. Eye: [name] Cardiovascular:  Regular rate and rhythm, no rubs murmurs or gallops.  No Carotid bruits, radial pulse intact. No pedal edema.  Respiratory: Few rhonchi, right lower anterior chest. Abdominal: No organomegaly, abdomen is soft and non-tender, positive bowel sounds.  No masses. Musculoskeletal: Gait intact. No edema, tenderness Skin: No rashes. Neurologic: Facial musculature symmetric. Psychiatric: Patient acts appropriately throughout our interaction. Lymphatic: No cervical or submandibular lymphadenopathy    LABS: Results for orders placed or performed in visit on 06/28/15  POCT CBC  Result Value Ref Range   WBC 6.3 4.6 - 10.2 K/uL   Lymph, poc 1.8 0.6 - 3.4   POC LYMPH PERCENT 29.1 10 - 50 %L   MID (cbc) 0.1 0 - 0.9   POC MID % 2.1 0 - 12 %M   POC Granulocyte 4.3 2 - 6.9   Granulocyte percent 68.8 37 - 80 %G   RBC 4.76 4.69 - 6.13 M/uL   Hemoglobin 15.4 14.1 - 18.1 g/dL   HCT, POC 43.8 43.5 - 53.7 %   MCV 91.9 80 - 97 fL   MCH, POC 32.3 (A) 27 - 31.2 pg   MCHC 35.1 31.8 - 35.4 g/dL   RDW, POC 12.9 %    Platelet Count, POC 197 142 - 424 K/uL   MPV 7.1 0 - 99.8 fL     EKG/XRAY:   Primary read interpreted by Dr. Everlene Farrier at [facility]. Dg Chest 2 View  06/28/2015  CLINICAL DATA:  Shortness of breath. EXAM: CHEST  2 VIEW COMPARISON:  07/20/2013. FINDINGS: Mediastinum hilar structures are normal. Mild left lung base subsegmental atelectasis and or infiltrate. No pleural effusion or pneumothorax. Heart size normal. No acute bony abnormality. IMPRESSION: Mild left lung base subsegmental atelectasis and or infiltrate. Mild pneumonia cannot be excluded . Electronically Signed   By: Marcello Moores  Register   On: 06/28/2015 13:41    ASSESSMENT/PLAN: White count but there is an infiltrate in the left lung base. Will treat with Levaquin for  7 days.I personally performed the services described in this documentation, which was scribed in my presence. The recorded information has been reviewed and is accurate.    Gross sideeffects, risk and benefits, and alternatives of medications d/w patient. Patient is aware that all medications have potential sideeffects and we are unable to predict every sideeffect or drug-drug interaction that may occur.  [name] 06/28/2015 1:16 PM

## 2015-06-28 NOTE — Patient Instructions (Addendum)
Because you received an x-ray today, you will receive an invoice from [facility]. Please contact [facility] at [phone] with questions or concerns regarding your invoice. Our billing staff will not be able to assist you with those questions.    Community-Acquired Pneumonia, Adult Pneumonia is an infection of the lungs. There are different types of pneumonia. One type can develop while a person is in a hospital. A different type, called community-acquired pneumonia, develops in people who are not, or have not recently been, in the hospital or other health care facility.  CAUSES Pneumonia may be caused by bacteria, viruses, or funguses. Community-acquired pneumonia is often caused by Streptococcus pneumonia bacteria. These bacteria are often passed from one person to another by breathing in droplets from the cough or sneeze of an infected person. RISK FACTORS The condition is more likely to develop in:  People who havechronic diseases, such as chronic obstructive pulmonary disease (COPD), asthma, congestive heart failure, cystic fibrosis, diabetes, or kidney disease.  People who haveearly-stage or late-stage HIV.  People who havesickle cell disease.  People who havehad their spleen removed (splenectomy).  People who havepoor dental hygiene.  People who havemedical conditions that increase the risk of breathing in (aspirating) secretions their own mouth and nose.   People who havea weakened immune system (immunocompromised).  People who smoke.  People whotravel to areas where pneumonia-causing germs commonly exist.  People whoare around animal habitats or animals that have pneumonia-causing germs, including birds, bats, rabbits, cats, and farm animals. SYMPTOMS Symptoms of this condition include:  Adry cough.  A wet (productive) cough.  Fever.  Sweating.  Chest pain, especially when breathing deeply or coughing.  Rapid breathing or difficulty  breathing.  Shortness of breath.  Shaking chills.  Fatigue.  Muscle aches. DIAGNOSIS Your health care provider will take a medical history and perform a physical exam. You may also have other tests, including:  Imaging studies of your chest, including X-rays.  Tests to check your blood oxygen level and other blood gases.  Other tests on blood, mucus (sputum), fluid around your lungs (pleural fluid), and urine. If your pneumonia is severe, other tests may be done to identify the specific cause of your illness. TREATMENT The type of treatment that you receive depends on many factors, such as the cause of your pneumonia, the medicines you take, and other medical conditions that you have. For most adults, treatment and recovery from pneumonia may occur at home. In some cases, treatment must happen in a hospital. Treatment may include:  Antibiotic medicines, if the pneumonia was caused by bacteria.  Antiviral medicines, if the pneumonia was caused by a virus.  Medicines that are given by mouth or through an IV tube.  Oxygen.  Respiratory therapy. Although rare, treating severe pneumonia may include:  Mechanical ventilation. This is done if you are not breathing well on your own and you cannot maintain a safe blood oxygen level.  Thoracentesis. This procedureremoves fluid around one lung or both lungs to help you breathe better. HOME CARE INSTRUCTIONS  Take over-the-counter and prescription medicines only as told by your health care provider.  Only takecough medicine if you are losing sleep. Understand that cough medicine can prevent your body's natural ability to remove mucus from your lungs.  If you were prescribed an antibiotic medicine, take it as told by your health care provider. Do not stop taking the antibiotic even if you start to feel better.  Sleep in a semi-upright position at night. Try   sleeping in a reclining chair, or place a few pillows under your head.  Do  not use tobacco products, including cigarettes, chewing tobacco, and e-cigarettes. If you need help quitting, ask your health care provider.  Drink enough water to keep your urine clear or pale yellow. This will help to thin out mucus secretions in your lungs. PREVENTION There are ways that you can decrease your risk of developing community-acquired pneumonia. Consider getting a pneumococcal vaccine if:  You are older than 56 years of age.  You are older than 56 years of age and are undergoing cancer treatment, have chronic lung disease, or have other medical conditions that affect your immune system. Ask your health care provider if this applies to you. There are different types and schedules of pneumococcal vaccines. Ask your health care provider which vaccination option is best for you. You may also prevent community-acquired pneumonia if you take these actions:  Get an influenza vaccine every year. Ask your health care provider which type of influenza vaccine is best for you.  Go to the dentist on a regular basis.  Wash your hands often. Use hand sanitizer if soap and water are not available. SEEK MEDICAL CARE IF:  You have a fever.  You are losing sleep because you cannot control your cough with cough medicine. SEEK IMMEDIATE MEDICAL CARE IF:  You have worsening shortness of breath.  You have increased chest pain.  Your sickness becomes worse, especially if you are an older adult or have a weakened immune system.  You cough up blood.   This information is not intended to replace advice given to you by your health care provider. Make sure you discuss any questions you have with your health care provider.   Document Released: 04/16/2005 Document Revised: 01/05/2015 Document Reviewed: 08/11/2014 Elsevier Interactive Patient Education 2016 [employer]   

## 2015-07-03 ENCOUNTER — Ambulatory Visit (INDEPENDENT_AMBULATORY_CARE_PROVIDER_SITE_OTHER): Payer: PPO | Admitting: Family Medicine

## 2015-07-03 ENCOUNTER — Ambulatory Visit (INDEPENDENT_AMBULATORY_CARE_PROVIDER_SITE_OTHER): Payer: PPO

## 2015-07-03 VITALS — BP 120/60 | HR 109 | Temp 97.9°F | Resp 16 | Ht 70.0 in | Wt 151.0 lb

## 2015-07-03 DIAGNOSIS — E860 Dehydration: Secondary | ICD-10-CM | POA: Diagnosis not present

## 2015-07-03 DIAGNOSIS — R509 Fever, unspecified: Secondary | ICD-10-CM | POA: Diagnosis not present

## 2015-07-03 DIAGNOSIS — J1100 Influenza due to unidentified influenza virus with unspecified type of pneumonia: Secondary | ICD-10-CM | POA: Diagnosis not present

## 2015-07-03 DIAGNOSIS — R51 Headache: Secondary | ICD-10-CM | POA: Diagnosis not present

## 2015-07-03 DIAGNOSIS — G8929 Other chronic pain: Secondary | ICD-10-CM

## 2015-07-03 LAB — POCT CBC
GRANULOCYTE PERCENT: 74.5 % (ref 37–80)
HEMATOCRIT: 44.1 % (ref 43.5–53.7)
HEMOGLOBIN: 15.9 g/dL (ref 14.1–18.1)
LYMPH, POC: 2.6 (ref 0.6–3.4)
MCH, POC: 33 pg — AB (ref 27–31.2)
MCHC: 36 g/dL — AB (ref 31.8–35.4)
MCV: 91.5 fL (ref 80–97)
MID (cbc): 1.1 — AB (ref 0–0.9)
MPV: 7.3 fL (ref 0–99.8)
POC GRANULOCYTE: 10.8 — AB (ref 2–6.9)
POC LYMPH PERCENT: 18 %L (ref 10–50)
POC MID %: 7.5 %M (ref 0–12)
Platelet Count, POC: 288 10*3/uL (ref 142–424)
RBC: 4.81 M/uL (ref 4.69–6.13)
RDW, POC: 13.4 %
WBC: 14.5 10*3/uL — AB (ref 4.6–10.2)

## 2015-07-03 LAB — POCT URINALYSIS DIP (MANUAL ENTRY)
BILIRUBIN UA: NEGATIVE
Bilirubin, UA: NEGATIVE
Glucose, UA: NEGATIVE
LEUKOCYTES UA: NEGATIVE
Nitrite, UA: NEGATIVE
PH UA: 6.5
PROTEIN UA: NEGATIVE
SPEC GRAV UA: 1.01
Urobilinogen, UA: 0.2

## 2015-07-03 MED ORDER — PROMETHAZINE HCL 25 MG/ML IJ SOLN: 6.2500 mg | Freq: Once | INTRAMUSCULAR | Status: DC

## 2015-07-03 MED ORDER — AZITHROMYCIN 500 MG PO TABS: 500.0000 mg | ORAL_TABLET | Freq: Every day | ORAL | Status: DC

## 2015-07-03 MED ORDER — PROMETHAZINE-DM 6.25-15 MG/5ML PO SYRP: 5.0000 mL | ORAL_SOLUTION | Freq: Four times a day (QID) | ORAL | Status: DC | PRN

## 2015-07-03 NOTE — Patient Instructions (Addendum)
Because you received an x-ray today, you will receive an invoice from [facility]. Please contact  Radiology at [phone] with questions or concerns regarding your invoice. Our billing staff will not be able to assist you with those questions.    Community-Acquired Pneumonia, Adult Pneumonia is an infection of the lungs. There are different types of pneumonia. One type can develop while a person is in a hospital. A different type, called community-acquired pneumonia, develops in people who are not, or have not recently been, in the hospital or other health care facility.  CAUSES Pneumonia may be caused by bacteria, viruses, or funguses. Community-acquired pneumonia is often caused by Streptococcus pneumonia bacteria. These bacteria are often passed from one person to another by breathing in droplets from the cough or sneeze of an infected person. RISK FACTORS The condition is more likely to develop in:  People who havechronic diseases, such as chronic obstructive pulmonary disease (COPD), asthma, congestive heart failure, cystic fibrosis, diabetes, or kidney disease.  People who haveearly-stage or late-stage HIV.  People who havesickle cell disease.  People who havehad their spleen removed (splenectomy).  People who havepoor dental hygiene.  People who havemedical conditions that increase the risk of breathing in (aspirating) secretions their own mouth and nose.   People who havea weakened immune system (immunocompromised).  People who smoke.  People whotravel to areas where pneumonia-causing germs commonly exist.  People whoare around animal habitats or animals that have pneumonia-causing germs, including birds, bats, rabbits, cats, and farm animals. SYMPTOMS Symptoms of this condition include:  Adry cough.  A wet (productive) cough.  Fever.  Sweating.  Chest pain, especially when breathing deeply or coughing.  Rapid breathing or difficulty  breathing.  Shortness of breath.  Shaking chills.  Fatigue.  Muscle aches. DIAGNOSIS Your health care provider will take a medical history and perform a physical exam. You may also have other tests, including:  Imaging studies of your chest, including X-rays.  Tests to check your blood oxygen level and other blood gases.  Other tests on blood, mucus (sputum), fluid around your lungs (pleural fluid), and urine. If your pneumonia is severe, other tests may be done to identify the specific cause of your illness. TREATMENT The type of treatment that you receive depends on many factors, such as the cause of your pneumonia, the medicines you take, and other medical conditions that you have. For most adults, treatment and recovery from pneumonia may occur at home. In some cases, treatment must happen in a hospital. Treatment may include:  Antibiotic medicines, if the pneumonia was caused by bacteria.  Antiviral medicines, if the pneumonia was caused by a virus.  Medicines that are given by mouth or through an IV tube.  Oxygen.  Respiratory therapy. Although rare, treating severe pneumonia may include:  Mechanical ventilation. This is done if you are not breathing well on your own and you cannot maintain a safe blood oxygen level.  Thoracentesis. This procedureremoves fluid around one lung or both lungs to help you breathe better. HOME CARE INSTRUCTIONS  Take over-the-counter and prescription medicines only as told by your health care provider.  Only takecough medicine if you are losing sleep. Understand that cough medicine can prevent your body's natural ability to remove mucus from your lungs.  If you were prescribed an antibiotic medicine, take it as told by your health care provider. Do not stop taking the antibiotic even if you start to feel better.  Sleep in a semi-upright position at night. Try   sleeping in a reclining chair, or place a few pillows under your head.  Do  not use tobacco products, including cigarettes, chewing tobacco, and e-cigarettes. If you need help quitting, ask your health care provider.  Drink enough water to keep your urine clear or pale yellow. This will help to thin out mucus secretions in your lungs. PREVENTION There are ways that you can decrease your risk of developing community-acquired pneumonia. Consider getting a pneumococcal vaccine if:  You are older than 56 years of age.  You are older than 56 years of age and are undergoing cancer treatment, have chronic lung disease, or have other medical conditions that affect your immune system. Ask your health care provider if this applies to you. There are different types and schedules of pneumococcal vaccines. Ask your health care provider which vaccination option is best for you. You may also prevent community-acquired pneumonia if you take these actions:  Get an influenza vaccine every year. Ask your health care provider which type of influenza vaccine is best for you.  Go to the dentist on a regular basis.  Wash your hands often. Use hand sanitizer if soap and water are not available. SEEK MEDICAL CARE IF:  You have a fever.  You are losing sleep because you cannot control your cough with cough medicine. SEEK IMMEDIATE MEDICAL CARE IF:  You have worsening shortness of breath.  You have increased chest pain.  Your sickness becomes worse, especially if you are an older adult or have a weakened immune system.  You cough up blood.   This information is not intended to replace advice given to you by your health care provider. Make sure you discuss any questions you have with your health care provider.   Document Released: 04/16/2005 Document Revised: 01/05/2015 Document Reviewed: 08/11/2014 Elsevier Interactive Patient Education 2016 [employer] Inc.   

## 2015-07-03 NOTE — Progress Notes (Addendum)
Subjective:    Patient ID: William [name], male    DOB: 1960-06-25, 55 y.o.   MRN: [id number] By signing my name below, I, [name], attest that this documentation has been prepared under the direction and in the presence of [name], MD. Electronically Signed: [name], ER Scribe. 07/03/2015. 2:49 PM.  Chief Complaint  Patient presents with  . Follow-up    dehydrated, weak, not feeling much better from flu     HPI HPI Comments: William [name] is a 55 y.o. male who presents to [facility] complaining of persistent dry cough, stomach pain, fatigue, HA and sore throat for the last two weeks. He has been sweating profusely, especially at night. He has felt constantly SOB for the last two weeks. He was initially seen on 06/21/2014 when he was diagnosed with influenza and started on tamiflu, then on 06/23/2014 when albuterol was added, he then returned on 06/27/2014 at which time a CXR showed a left lower lobe infiltrate so pt was started on a seven day course of Levaquin. He reports still with severe decreased po intake - feels dehydrated and requests IVF - and still with severe episodic diaphoresis.  He has 1 more dose of the levaquin  He is disabled due to migraines and he is very sensitive to medications - intolerance of a numerous amount of them.  Has done fine on tussionex in the past - wife requests refill of that for pt.  Past Medical History  Diagnosis Date  . Allergy   . Depression   . Migraine headache   . Anxiety   . Mitral valve prolapse   . Heart murmur    Allergies  Allergen Reactions  . Erythromycin Nausea And Vomiting and Anaphylaxis  . Cephalexin Diarrhea and Nausea And Vomiting  . Cephalosporins Diarrhea and Nausea And Vomiting  . Compazine [Prochlorperazine Edisylate]     uncontrollable shake - tremor  . Hydrocodone-Acetaminophen Nausea And Vomiting  . Ketoconazole Itching  . Ketorolac Nausea And Vomiting  . Ketorolac Tromethamine    REACTION: Reaction not known  . Latex Itching    REACTION: Reaction not known  . Nalbuphine Other (See Comments)    States loss of consciousness REACTION: Reaction not known  . Oxycodone-Acetaminophen Other (See Comments) and Swelling  . Prednisone Nausea And Vomiting  . Prochlorperazine Edisylate     REACTION: shakes/tremors  . Topamax [Topiramate] Diarrhea and Nausea And Vomiting  . Tramadol Nausea And Vomiting  . Cefdinir Diarrhea, Nausea And Vomiting and Rash  . Neosporin [Neomycin-Bacitracin Zn-Polymyx] Rash   Current Outpatient Prescriptions on File Prior to Visit  Medication Sig Dispense Refill  . azelastine (ASTELIN) 0.1 % nasal spray Place 1 spray into both nostrils 2 (two) times daily. Use in each nostril as directed 30 mL 12  . clonazePAM (KLONOPIN) 0.5 MG tablet Take 1 tablet (0.5 mg total) by mouth daily as needed for anxiety. May take 1/2 tab.  Wait until December to fill 90 tablet 1  . clotrimazole-betamethasone (LOTRISONE) cream Apply 1 application topically 2 (two) times daily. 30 g 0  . levofloxacin (LEVAQUIN) 500 MG tablet Take 1 tablet (500 mg total) by mouth daily. 7 tablet 0  . lidocaine (LIDODERM) 5 % Place 1 patch onto the skin daily. Remove & Discard patch within 12 hours or as directed by MD 30 patch 0  . mirtazapine (REMERON) 30 MG tablet TAKE ONE TABLET AT BEDTIME. 90 tablet 3  . olopatadine (PATANOL) 0.1 % ophthalmic  solution Place 1 drop into both eyes 2 (two) times daily. 5 mL 12  . oseltamivir (TAMIFLU) 75 MG capsule Take 1 capsule (75 mg total) by mouth 2 (two) times daily. 10 capsule 0  . Albuterol Sulfate (PROAIR RESPICLICK) 123XX123 (90 Base) MCG/ACT AEPB Inhale 2 puffs into the lungs every 6 (six) hours as needed. (Patient not taking: Reported on 06/28/2015) 1 each 3  . cyclobenzaprine (FLEXERIL) 5 MG tablet Take 1 tablet (5 mg total) by mouth 2 (two) times daily as needed for muscle spasms. (Patient not taking: Reported on 06/22/2015) 30 tablet 0  . Naproxen  Sodium (ALEVE PO) Take by mouth. Reported on 07/03/2015     No current facility-administered medications on file prior to visit.    Review of Systems  Constitutional: Positive for fever, chills, diaphoresis, activity change, appetite change and fatigue.  HENT: Positive for sore throat.   Respiratory: Positive for cough, chest tightness and shortness of breath. Negative for choking and wheezing.   Gastrointestinal: Positive for nausea and abdominal pain. Negative for vomiting.  Genitourinary: Negative for dysuria, urgency, flank pain and decreased urine volume.  Musculoskeletal: Positive for myalgias and arthralgias. Negative for joint swelling and gait problem.  Skin: Negative for rash.  Neurological: Positive for dizziness, weakness, light-headedness and headaches.  Hematological: Positive for adenopathy.       Objective:  BP 120/60 mmHg  Pulse 109  Temp(Src) 97.9 F (36.6 C) (Oral)  Resp 16  Ht 5\' 10"  (1.778 m)  Wt 151 lb (68.493 kg)  BMI 21.67 kg/m2  SpO2 98%  Physical Exam  Constitutional: He is oriented to person, place, and time. He appears well-developed and well-nourished. No distress.  HENT:  Head: Normocephalic and atraumatic.  TMs normal. Nares with edema, swollen shut. Decreased air movement right upper and left lower lobes. Posterior cervical and submandibular adenopathy.   Eyes: Pupils are equal, round, and reactive to light.  Neck: Neck supple.  Cardiovascular: Normal rate.   Pulmonary/Chest: Effort normal. No respiratory distress.  Abdominal: Soft.  Musculoskeletal: Normal range of motion.  Neurological: He is alert and oriented to person, place, and time. Coordination normal.  Skin: Skin is warm and dry. He is not diaphoretic.  Psychiatric: He has a normal mood and affect. His behavior is normal.  Nursing note and vitals reviewed.     Results for orders placed or performed in visit on 07/03/15  POCT CBC  Result Value Ref Range   WBC 14.5 (A) 4.6 - 10.2  K/uL   Lymph, poc 2.6 0.6 - 3.4   POC LYMPH PERCENT 18.0 10 - 50 %L   MID (cbc) 1.1 (A) 0 - 0.9   POC MID % 7.5 0 - 12 %M   POC Granulocyte 10.8 (A) 2 - 6.9   Granulocyte percent 74.5 37 - 80 %G   RBC 4.81 4.69 - 6.13 M/uL   Hemoglobin 15.9 14.1 - 18.1 g/dL   HCT, POC 44.1 43.5 - 53.7 %   MCV 91.5 80 - 97 fL   MCH, POC 33.0 (A) 27 - 31.2 pg   MCHC 36.0 (A) 31.8 - 35.4 g/dL   RDW, POC 13.4 %   Platelet Count, POC 288 142 - 424 K/uL   MPV 7.3 0 - 99.8 fL  POCT urinalysis dipstick  Result Value Ref Range   Color, UA yellow yellow   Clarity, UA clear clear   Glucose, UA negative negative   Bilirubin, UA negative negative   Ketones, POC UA negative  negative   Spec Grav, UA 1.010    Blood, UA trace-intact (A) negative   pH, UA 6.5    Protein Ur, POC negative negative   Urobilinogen, UA 0.2    Nitrite, UA Negative Negative   Leukocytes, UA Negative Negative   Orthostatics negative - systolic bp decreased by 8 but HR increased by 19 from lying -> standing Assessment & Plan:   1. Chronic nonintractable headache, unspecified headache type   2. Dehydration - some improvement in sxs after 1L IVF in office - discussed trying toradol 30mg  IM (as only on allergy list for stomach upset) or promethazine 12.5 IM but held off as pt has so many medication sensitivities - i didn't want to add anything that might complicate his recovery course.  3. Influenza with pneumonia - is s/p course of tamiflu and levaquin. CXR looks much improved but pt still symptomatic with cough, diphoresis, weak - feeling ill and leukocytosis is worsening so will cover for walking/atypical pna with azithor. Recheck w/ me in 3d, sooner if worse.  Pt has so many medication sensitivities I was surprised that he tolerated tussionex since it is so potent - advised trial of promethazine cough syrup instead - I am hoping the promethazine will allow him to push fluids by decreasing nausea as well as decrease migraine HAs in  addition to suppressing cough  Orders Placed This Encounter  Procedures  . DG Chest 2 View    Standing Status: Future     Number of Occurrences: 1     Standing Expiration Date: 07/02/2016    Order Specific Question:  Reason for Exam (SYMPTOM  OR DIAGNOSIS REQUIRED)    Answer:  worsening fever, diaphoresis    Order Specific Question:  Preferred imaging location?    Answer:  External  . Orthostatic vital signs  . POCT CBC  . POCT urinalysis dipstick  . Insert peripheral IV    Meds ordered this encounter  Medications  . DISCONTD: promethazine (PHENERGAN) injection 6.25 mg    Sig:   . azithromycin (ZITHROMAX) 500 MG tablet    Sig: Take 1 tablet (500 mg total) by mouth daily.    Dispense:  3 tablet    Refill:  0  . promethazine-dextromethorphan (PROMETHAZINE-DM) 6.25-15 MG/5ML syrup    Sig: Take 5-10 mLs by mouth 4 (four) times daily as needed for cough.    Dispense:  180 mL    Refill:  0   Over 40 min spent in face-to-face evaluation of and consultation with patient and coordination of care.  Over 50% of this time was spent counseling this patient.   I personally performed the services described in this documentation, which was scribed in my presence. The recorded information has been reviewed and considered, and addended by me as needed.  [name], MD MPH

## 2015-07-06 ENCOUNTER — Ambulatory Visit (INDEPENDENT_AMBULATORY_CARE_PROVIDER_SITE_OTHER): Payer: PPO | Admitting: Family Medicine

## 2015-07-06 ENCOUNTER — Other Ambulatory Visit: Payer: Self-pay | Admitting: Family Medicine

## 2015-07-06 VITALS — BP 122/72 | HR 105 | Temp 98.2°F | Resp 17 | Ht 70.5 in | Wt 149.0 lb

## 2015-07-06 DIAGNOSIS — J1100 Influenza due to unidentified influenza virus with unspecified type of pneumonia: Secondary | ICD-10-CM | POA: Diagnosis not present

## 2015-07-06 LAB — POCT CBC
Granulocyte percent: 67.3 %G (ref 37–80)
HCT, POC: 44.4 % (ref 43.5–53.7)
HEMOGLOBIN: 15.4 g/dL (ref 14.1–18.1)
LYMPH, POC: 1.9 (ref 0.6–3.4)
MCH, POC: 32 pg — AB (ref 27–31.2)
MCHC: 34.7 g/dL (ref 31.8–35.4)
MCV: 92.1 fL (ref 80–97)
MID (cbc): 0.4 (ref 0–0.9)
MPV: 7.1 fL (ref 0–99.8)
PLATELET COUNT, POC: 311 10*3/uL (ref 142–424)
POC Granulocyte: 4.6 (ref 2–6.9)
POC LYMPH %: 27.4 % (ref 10–50)
POC MID %: 5.3 %M (ref 0–12)
RBC: 4.83 M/uL (ref 4.69–6.13)
RDW, POC: 13.1 %
WBC: 6.9 10*3/uL (ref 4.6–10.2)

## 2015-07-06 LAB — SEDIMENTATION RATE

## 2015-07-06 MED ORDER — IPRATROPIUM BROMIDE 0.03 % NA SOLN: 2.0000 | Freq: Four times a day (QID) | NASAL | Status: DC

## 2015-07-06 MED ORDER — PROMETHAZINE-DM 6.25-15 MG/5ML PO SYRP: 5.0000 mL | ORAL_SOLUTION | Freq: Four times a day (QID) | ORAL | Status: DC | PRN

## 2015-07-06 NOTE — Patient Instructions (Addendum)
You can slowly resume normal activities.  You will likely continue to have a cough over the rest of the month but you should not feel bad nor should it be productive.  If you start feeling worse, come back.  Use the albuterol prior to exercise, ok to continue to use the cough syrup at night and delsym or mucinex DM during the day.  Start the atrovent nasal spray several times a day for the next 1-2 weeks.   Community-Acquired Pneumonia, Adult Pneumonia is an infection of the lungs. There are different types of pneumonia. One type can develop while a person is in a hospital. A different type, called community-acquired pneumonia, develops in people who are not, or have not recently been, in the hospital or other health care facility.  CAUSES Pneumonia may be caused by bacteria, viruses, or funguses. Community-acquired pneumonia is often caused by Streptococcus pneumonia bacteria. These bacteria are often passed from one person to another by breathing in droplets from the cough or sneeze of an infected person. RISK FACTORS The condition is more likely to develop in:  People who havechronic diseases, such as chronic obstructive pulmonary disease (COPD), asthma, congestive heart failure, cystic fibrosis, diabetes, or kidney disease.  People who haveearly-stage or late-stage HIV.  People who havesickle cell disease.  People who havehad their spleen removed (splenectomy).  People who havepoor Human resources officer.  People who havemedical conditions that increase the risk of breathing in (aspirating) secretions their own mouth and nose.   People who havea weakened immune system (immunocompromised).  People who smoke.  People whotravel to areas where pneumonia-causing germs commonly exist.  People whoare around animal habitats or animals that have pneumonia-causing germs, including birds, bats, rabbits, cats, and farm animals. SYMPTOMS Symptoms of this condition include:  Adry cough.  A  wet (productive) cough.  Fever.  Sweating.  Chest pain, especially when breathing deeply or coughing.  Rapid breathing or difficulty breathing.  Shortness of breath.  Shaking chills.  Fatigue.  Muscle aches. DIAGNOSIS Your health care provider will take a medical history and perform a physical exam. You may also have other tests, including:  Imaging studies of your chest, including X-rays.  Tests to check your blood oxygen level and other blood gases.  Other tests on blood, mucus (sputum), fluid around your lungs (pleural fluid), and urine. If your pneumonia is severe, other tests may be done to identify the specific cause of your illness. TREATMENT The type of treatment that you receive depends on many factors, such as the cause of your pneumonia, the medicines you take, and other medical conditions that you have. For most adults, treatment and recovery from pneumonia may occur at home. In some cases, treatment must happen in a hospital. Treatment may include:  Antibiotic medicines, if the pneumonia was caused by bacteria.  Antiviral medicines, if the pneumonia was caused by a virus.  Medicines that are given by mouth or through an IV tube.  Oxygen.  Respiratory therapy. Although rare, treating severe pneumonia may include:  Mechanical ventilation. This is done if you are not breathing well on your own and you cannot maintain a safe blood oxygen level.  Thoracentesis. This procedureremoves fluid around one lung or both lungs to help you breathe better. HOME CARE INSTRUCTIONS  Take over-the-counter and prescription medicines only as told by your health care provider.  Only takecough medicine if you are losing sleep. Understand that cough medicine can prevent your body's natural ability to remove mucus from your lungs.  If you were prescribed an antibiotic medicine, take it as told by your health care provider. Do not stop taking the antibiotic even if you start to  feel better.  Sleep in a semi-upright position at night. Try sleeping in a reclining chair, or place a few pillows under your head.  Do not use tobacco products, including cigarettes, chewing tobacco, and e-cigarettes. If you need help quitting, ask your health care provider.  Drink enough water to keep your urine clear or pale yellow. This will help to thin out mucus secretions in your lungs. PREVENTION There are ways that you can decrease your risk of developing community-acquired pneumonia. Consider getting a pneumococcal vaccine if:  You are older than 56 years of age.  You are older than 56 years of age and are undergoing cancer treatment, have chronic lung disease, or have other medical conditions that affect your immune system. Ask your health care provider if this applies to you. There are different types and schedules of pneumococcal vaccines. Ask your health care provider which vaccination option is best for you. You may also prevent community-acquired pneumonia if you take these actions:  Get an influenza vaccine every year. Ask your health care provider which type of influenza vaccine is best for you.  Go to the dentist on a regular basis.  Wash your hands often. Use hand sanitizer if soap and water are not available. SEEK MEDICAL CARE IF:  You have a fever.  You are losing sleep because you cannot control your cough with cough medicine. SEEK IMMEDIATE MEDICAL CARE IF:  You have worsening shortness of breath.  You have increased chest pain.  Your sickness becomes worse, especially if you are an older adult or have a weakened immune system.  You cough up blood.   This information is not intended to replace advice given to you by your health care provider. Make sure you discuss any questions you have with your health care provider.   Document Released: 04/16/2005 Document Revised: 01/05/2015 Document Reviewed: 08/11/2014 Elsevier Interactive Patient Education [employer]    Because you received labwork today, you will receive an invoice from [employer]. Please contact [facility] at [phone] with questions or concerns regarding your invoice. Our billing staff will not be able to assist you with those questions.  You will be contacted with the lab results as soon as they are available. The fastest way to get your results is to activate your [name] account. Instructions are located on the last page of this paperwork. If you have not heard from [place] regarding the results in 2 weeks, please contact this office.

## 2015-07-06 NOTE — Progress Notes (Signed)
Subjective:  By signing my name below, I, [name], attest that this documentation has been prepared under the direction and in the presence of [name], MD Electronically Signed: Ladene Artist, ED Scribe 07/06/2015 at 9:51 AM.   Patient ID: William [name], male    DOB: 05-Jun-1960, 55 y.o.   MRN: [id number]  Chief Complaint  Patient presents with  . Follow-up    pneumonia    HPI HPI Comments: William [name] is a 55 y.o. male who presents to the [facility] for a follow-up regarding PNA. Pt was initially seen 2 weeks ago with 5 days of illness at that point; diagnosed with influenza but he continued to worsen after 10 days and CXR showed a left lung base infiltrate. Pt was started on Levaquin for 7 days. He was seen by me for first time 3 days ago at which point he was feeling very weak, dehydration and complaining of continuous diaphoresis. CXR cleared but developed leukocytosis and started on Zithromax 500 mg x3 days after receiving 1 liter of IV fluids in office.   Today, pt states that he is feeling a little better but is still experiencing cough with thick, white sputum, night sweats that causes him to change shirts x3-4 nightly, rhinorrhea, SOB on exertion. He denies loss of appetite, nasal congestion, sinus pressure, HA, abdominal pain. Pt has finished his antibiotics and has taken cough syrup x2 daily. No h/o asthma   Past Medical History  Diagnosis Date  . Allergy   . Depression   . Migraine headache   . Anxiety   . Mitral valve prolapse   . Heart murmur    Current Outpatient Prescriptions on File Prior to Visit  Medication Sig Dispense Refill  . Albuterol Sulfate (PROAIR RESPICLICK) 123XX123 (90 Base) MCG/ACT AEPB Inhale 2 puffs into the lungs every 6 (six) hours as needed. 1 each 3  . azelastine (ASTELIN) 0.1 % nasal spray Place 1 spray into both nostrils 2 (two) times daily. Use in each nostril as directed 30 mL 12  . azithromycin  (ZITHROMAX) 500 MG tablet Take 1 tablet (500 mg total) by mouth daily. 3 tablet 0  . clonazePAM (KLONOPIN) 0.5 MG tablet Take 1 tablet (0.5 mg total) by mouth daily as needed for anxiety. May take 1/2 tab.  Wait until December to fill 90 tablet 1  . clotrimazole-betamethasone (LOTRISONE) cream Apply 1 application topically 2 (two) times daily. 30 g 0  . cyclobenzaprine (FLEXERIL) 5 MG tablet Take 1 tablet (5 mg total) by mouth 2 (two) times daily as needed for muscle spasms. 30 tablet 0  . levofloxacin (LEVAQUIN) 500 MG tablet Take 1 tablet (500 mg total) by mouth daily. 7 tablet 0  . lidocaine (LIDODERM) 5 % Place 1 patch onto the skin daily. Remove & Discard patch within 12 hours or as directed by MD 30 patch 0  . mirtazapine (REMERON) 30 MG tablet TAKE ONE TABLET AT BEDTIME. 90 tablet 3  . Naproxen Sodium (ALEVE PO) Take by mouth. Reported on 07/03/2015    . olopatadine (PATANOL) 0.1 % ophthalmic solution Place 1 drop into both eyes 2 (two) times daily. 5 mL 12  . oseltamivir (TAMIFLU) 75 MG capsule Take 1 capsule (75 mg total) by mouth 2 (two) times daily. 10 capsule 0  . promethazine-dextromethorphan (PROMETHAZINE-DM) 6.25-15 MG/5ML syrup Take 5-10 mLs by mouth 4 (four) times daily as needed for cough. 180 mL 0   No current facility-administered medications on  file prior to visit.   Allergies  Allergen Reactions  . Erythromycin Nausea And Vomiting and Anaphylaxis  . Cephalexin Diarrhea and Nausea And Vomiting  . Cephalosporins Diarrhea and Nausea And Vomiting  . Compazine [Prochlorperazine Edisylate]     uncontrollable shake - tremor  . Hydrocodone-Acetaminophen Nausea And Vomiting  . Ketoconazole Itching  . Ketorolac Nausea And Vomiting  . Ketorolac Tromethamine     REACTION: Reaction not known  . Latex Itching    REACTION: Reaction not known  . Nalbuphine Other (See Comments)    States loss of consciousness REACTION: Reaction not known  . Oxycodone-Acetaminophen Other (See  Comments) and Swelling  . Prednisone Nausea And Vomiting  . Prochlorperazine Edisylate     REACTION: shakes/tremors  . Topamax [Topiramate] Diarrhea and Nausea And Vomiting  . Tramadol Nausea And Vomiting  . Cefdinir Diarrhea, Nausea And Vomiting and Rash  . Neosporin [Neomycin-Bacitracin Zn-Polymyx] Rash   Review of Systems  Constitutional: Positive for diaphoresis. Negative for appetite change.  HENT: Positive for rhinorrhea. Negative for congestion and sinus pressure.   Respiratory: Positive for cough and shortness of breath.   Gastrointestinal: Negative for abdominal pain.  Neurological: Negative for headaches.   BP 122/72 mmHg  Pulse 105  Temp(Src) 98.2 F (36.8 C) (Oral)  Resp 17  Ht 5' 10.5" (1.791 m)  Wt 149 lb (67.586 kg)  BMI 21.07 kg/m2  SpO2 98%    Objective:   Physical Exam  Constitutional: He is oriented to person, place, and time. He appears well-developed and well-nourished. No distress.  HENT:  Head: Normocephalic and atraumatic.  Right Ear: Tympanic membrane normal.  Left Ear: Tympanic membrane normal.  Nose: Rhinorrhea present.  Mouth/Throat: Oropharynx is clear and moist.  Nose: erythema   Eyes: Conjunctivae and EOM are normal.  Neck: Neck supple. No tracheal deviation present. No thyroid mass and no thyromegaly present.  Cardiovascular: Regular rhythm, S1 normal, S2 normal and normal heart sounds.  Tachycardia present.   Pulmonary/Chest: Effort normal. No respiratory distress.  Excellent air movement. Lungs clear throughout.   Musculoskeletal: Normal range of motion.  Lymphadenopathy:    He has no cervical adenopathy.       Right: No supraclavicular adenopathy present.       Left: No supraclavicular adenopathy present.  Neurological: He is alert and oriented to person, place, and time.  Skin: Skin is warm and dry.  Psychiatric: He has a normal mood and affect. His behavior is normal.  Nursing note and vitals reviewed.     Results for orders  placed or performed in visit on 07/06/15  Sedimentation rate  Result Value Ref Range   Sed Rate CANCELED 0 - 20 mm/hr  POCT CBC  Result Value Ref Range   WBC 6.9 4.6 - 10.2 K/uL   Lymph, poc 1.9 0.6 - 3.4   POC LYMPH PERCENT 27.4 10 - 50 %L   MID (cbc) 0.4 0 - 0.9   POC MID % 5.3 0 - 12 %M   POC Granulocyte 4.6 2 - 6.9   Granulocyte percent 67.3 37 - 80 %G   RBC 4.83 4.69 - 6.13 M/uL   Hemoglobin 15.4 14.1 - 18.1 g/dL   HCT, POC 44.4 43.5 - 53.7 %   MCV 92.1 80 - 97 fL   MCH, POC 32.0 (A) 27 - 31.2 pg   MCHC 34.7 31.8 - 35.4 g/dL   RDW, POC 13.1 %   Platelet Count, POC 311 142 - 424 K/uL  MPV 7.1 0 - 99.8 fL    Assessment & Plan:   1. Influenza with pneumonia   Doing [facility] better, completed course of tamiflu, levaquin, and azithro. RTC prn  Orders Placed This Encounter  Procedures  . Sedimentation rate  . POCT CBC    Meds ordered this encounter  Medications  . DISCONTD: promethazine-dextromethorphan (PROMETHAZINE-DM) 6.25-15 MG/5ML syrup    Sig: Take 5-10 mLs by mouth 4 (four) times daily as needed for cough.    Dispense:  180 mL    Refill:  0  . ipratropium (ATROVENT) 0.03 % nasal spray    Sig: Place 2 sprays into the nose 4 (four) times daily.    Dispense:  30 mL    Refill:  1    I personally performed the services described in this documentation, which was scribed in my presence. The recorded information has been reviewed and considered, and addended by me as needed.  [name], MD MPH

## 2015-07-07 ENCOUNTER — Telehealth: Payer: Self-pay

## 2015-07-07 MED ORDER — PROMETHAZINE-DM 6.25-15 MG/5ML PO SYRP: 5.0000 mL | ORAL_SOLUTION | Freq: Four times a day (QID) | ORAL | Status: DC | PRN

## 2015-07-07 NOTE — Telephone Encounter (Signed)
Rx sent to [facility] in [place] per pt request.

## 2015-07-07 NOTE — Telephone Encounter (Signed)
Pt needs a alternative cough med because [facility] couldn't get it. His cough is getting worse

## 2015-07-24 ENCOUNTER — Ambulatory Visit (INDEPENDENT_AMBULATORY_CARE_PROVIDER_SITE_OTHER): Payer: PPO

## 2015-07-24 ENCOUNTER — Ambulatory Visit (INDEPENDENT_AMBULATORY_CARE_PROVIDER_SITE_OTHER): Payer: PPO | Admitting: Family Medicine

## 2015-07-24 VITALS — BP 120/80 | HR 98 | Temp 98.0°F | Resp 18 | Ht 70.5 in | Wt 152.4 lb

## 2015-07-24 DIAGNOSIS — R51 Headache: Secondary | ICD-10-CM

## 2015-07-24 DIAGNOSIS — R0600 Dyspnea, unspecified: Secondary | ICD-10-CM

## 2015-07-24 DIAGNOSIS — R05 Cough: Secondary | ICD-10-CM | POA: Diagnosis not present

## 2015-07-24 DIAGNOSIS — R059 Cough, unspecified: Secondary | ICD-10-CM

## 2015-07-24 DIAGNOSIS — F411 Generalized anxiety disorder: Secondary | ICD-10-CM | POA: Diagnosis not present

## 2015-07-24 DIAGNOSIS — G8929 Other chronic pain: Secondary | ICD-10-CM

## 2015-07-24 LAB — POCT CBC
Granulocyte percent: 59 %G (ref 37–80)
HCT, POC: 45.8 % (ref 43.5–53.7)
Hemoglobin: 15.6 g/dL (ref 14.1–18.1)
Lymph, poc: 3.3 (ref 0.6–3.4)
MCH, POC: 31.8 pg — AB (ref 27–31.2)
MCHC: 34.2 g/dL (ref 31.8–35.4)
MCV: 93.2 fL (ref 80–97)
MID (cbc): 0.4 (ref 0–0.9)
MPV: 7.3 fL (ref 0–99.8)
POC Granulocyte: 5.4 (ref 2–6.9)
POC LYMPH PERCENT: 36.5 %L (ref 10–50)
POC MID %: 4.5 %M (ref 0–12)
Platelet Count, POC: 141 10*3/uL — AB (ref 142–424)
RBC: 4.92 M/uL (ref 4.69–6.13)
RDW, POC: 14 %
WBC: 9.1 10*3/uL (ref 4.6–10.2)

## 2015-07-24 MED ORDER — CLONAZEPAM 0.5 MG PO TABS: 0.5000 mg | ORAL_TABLET | Freq: Every day | ORAL | Status: DC | PRN

## 2015-07-24 MED ORDER — MIRTAZAPINE 30 MG PO TABS: ORAL_TABLET | ORAL | Status: DC

## 2015-07-24 MED ORDER — PROMETHAZINE-DM 6.25-15 MG/5ML PO SYRP: 5.0000 mL | ORAL_SOLUTION | Freq: Four times a day (QID) | ORAL | Status: DC | PRN

## 2015-07-24 MED ORDER — CLONAZEPAM 0.5 MG PO TABS
0.5000 mg | ORAL_TABLET | Freq: Every day | ORAL | Status: DC | PRN
Start: 2015-07-24 — End: 2015-12-31

## 2015-07-24 NOTE — Addendum Note (Signed)
Addended by: [name] on: 07/24/2015 04:10 PM   Modules accepted: Orders

## 2015-07-24 NOTE — Addendum Note (Signed)
Addended by: Suszanne Finch on: 07/24/2015 04:15 PM   Modules accepted: Orders, Medications

## 2015-07-24 NOTE — Progress Notes (Addendum)
By signing my name below, I, William [name], attest that this documentation has been prepared under the direction and in the presence of William Haber, MD. Electronically Signed: Moises [name], [place]. 07/24/2015 , 3:27 PM .  Patient was seen in room 11 .   Patient ID: [name] MRN: [id number], DOB: [birth year], 55 y.o. Date of Encounter: 07/24/2015  Primary Physician: William Haber, MD  Chief Complaint:  Chief Complaint  Patient presents with  . Cough    Was treated for Pnemonia on 07/06/15  . Shortness of Breath    HPI:  William [name] is a 55 y.o. male who presents to [facility] complaining of cough with shortness of breath. He was seen on 07/06/15 by Dr. Brigitte Pulse for influenza with pneumonia.   He's still "doesn't feel right". He has dry cough and shortness of breath with exertion. He informs having shortness of breath by walking from his apartment to the mailbox. He also becomes short of breath after an extended conversation. He notes that his coughs keep him awake at night so he hasn't had a good night's sleep for a while.   Past Medical History  Diagnosis Date  . Allergy   . Depression   . Migraine headache   . Anxiety   . Mitral valve prolapse   . Heart murmur      Home Meds: Prior to Admission medications   Medication Sig Start Date End Date Taking? Authorizing Provider  Albuterol Sulfate (PROAIR RESPICLICK) 123XX123 (90 Base) MCG/ACT AEPB Inhale 2 puffs into the lungs every 6 (six) hours as needed. 06/24/15   William Haber, MD  azelastine (ASTELIN) 0.1 % nasal spray Place 1 spray into both nostrils 2 (two) times daily. Use in each nostril as directed 06/22/15   William Haber, MD  clonazePAM (KLONOPIN) 0.5 MG tablet Take 1 tablet (0.5 mg total) by mouth daily as needed for anxiety. May take 1/2 tab.  Wait until December to fill 02/16/15   William Haber, MD  clotrimazole-betamethasone (LOTRISONE) cream Apply 1 application topically 2  (two) times daily. 06/22/15   William Haber, MD  cyclobenzaprine (FLEXERIL) 5 MG tablet Take 1 tablet (5 mg total) by mouth 2 (two) times daily as needed for muscle spasms. 05/23/15   William Haber, MD  ipratropium (ATROVENT) 0.03 % nasal spray Place 2 sprays into the nose 4 (four) times daily. 07/06/15   Shawnee Knapp, MD  lidocaine (LIDODERM) 5 % Place 1 patch onto the skin daily. Remove & Discard patch within 12 hours or as directed by MD 02/08/14   William Haber, MD  mirtazapine (REMERON) 30 MG tablet TAKE ONE TABLET AT BEDTIME. 05/14/15   William Haber, MD  Naproxen Sodium (ALEVE PO) Take by mouth. Reported on 07/03/2015    Historical Provider, MD  olopatadine (PATANOL) 0.1 % ophthalmic solution Place 1 drop into both eyes 2 (two) times daily. 06/22/15   William Haber, MD  promethazine-dextromethorphan (PROMETHAZINE-DM) 6.25-15 MG/5ML syrup Take 5-10 mLs by mouth 4 (four) times daily as needed for cough. 07/07/15   Shawnee Knapp, MD    Allergies:  Allergies  Allergen Reactions  . Erythromycin Nausea And Vomiting and Anaphylaxis  . Cephalexin Diarrhea and Nausea And Vomiting  . Cephalosporins Diarrhea and Nausea And Vomiting  . Compazine [Prochlorperazine Edisylate]     uncontrollable shake - tremor  . Hydrocodone-Acetaminophen Nausea And Vomiting  . Ketoconazole Itching  . Ketorolac Nausea And Vomiting  . Ketorolac Tromethamine  REACTION: Reaction not known  . Latex Itching    REACTION: Reaction not known  . Nalbuphine Other (See Comments)    States loss of consciousness REACTION: Reaction not known  . Oxycodone-Acetaminophen Other (See Comments) and Swelling  . Prednisone Nausea And Vomiting  . Prochlorperazine Edisylate     REACTION: shakes/tremors  . Topamax [Topiramate] Diarrhea and Nausea And Vomiting  . Tramadol Nausea And Vomiting  . Cefdinir Diarrhea, Nausea And Vomiting and Rash  . Neosporin [Neomycin-Bacitracin Zn-Polymyx] Rash    Social History   Social History    . Marital Status: Married    Spouse Name: N/A  . Number of Children: N/A  . Years of Education: N/A   Occupational History  . Not on file.   Social History Main Topics  . Smoking status: Never Smoker   . Smokeless tobacco: Not on file  . Alcohol Use: No  . Drug Use: No  . Sexual Activity:    Partners: Female   Other Topics Concern  . Not on file   Social History Narrative     Review of Systems: Constitutional: negative for fever, chills, night sweats, weight changes; positive for fatigue HEENT: negative for vision changes, hearing loss, congestion, rhinorrhea, ST, epistaxis, or sinus pressure Cardiovascular: negative for chest pain or palpitations Respiratory: negative for hemoptysis, wheezing; positive for shortness of breath, cough Abdominal: negative for abdominal pain, nausea, vomiting, diarrhea, or constipation Dermatological: negative for rash Neurologic: negative for headache, dizziness, or syncope All other systems reviewed and are otherwise negative with the exception to those above and in the HPI.  Physical Exam: [name] pressure 120/80, pulse 98, temperature 98 F (36.7 C), temperature source Oral, resp. rate 18, height 5' 10.5" (1.791 m), weight 152 lb 6 oz (69.117 kg), SpO2 97 %., Body mass index is 21.55 kg/(m^2). General: Well developed, well nourished, in no acute distress. Head: Normocephalic, atraumatic, eyes without discharge, sclera non-icteric, nares are without discharge. Bilateral auditory canals clear, TM's are without perforation, pearly grey and translucent with reflective cone of light bilaterally. Oral cavity moist, posterior pharynx without exudate, erythema, peritonsillar abscess, or post nasal drip.  Neck: Supple. No thyromegaly. Full ROM. No lymphadenopathy. Lungs: Clear bilaterally to auscultation without rales, or rhonchi. Breathing is unlabored. Few wheezes heard in left lung Heart: RRR with S1 S2. No murmurs, rubs, or gallops  appreciated. Abdomen: Soft, non-tender, non-distended with normoactive bowel sounds. No hepatomegaly. No rebound/guarding. No obvious abdominal masses. Msk:  Strength and tone normal for age. Extremities/Skin: Warm and dry. No clubbing or cyanosis. No edema. No rashes or suspicious lesions. Neuro: Alert and oriented X 3. Moves all extremities spontaneously. Gait is normal. CNII-XII grossly in tact. Psych:  Responds to questions appropriately with a normal affect.   Labs:   CLINICAL DATA: Worsening fever, diaphoresis, influenza with pneumonia  EXAM: CHEST 2 VIEW  COMPARISON: 06/28/2015  FINDINGS: The heart size and mediastinal contours are within normal limits. Both lungs are clear. The visualized skeletal structures are unremarkable.  IMPRESSION: No active cardiopulmonary disease.   Electronically Signed  By: Skipper Cliche M.D.  On: 07/03/2015 15:27   [facility] reading (PRIMARY) by  Dr. Joseph Art: chest xray: mild peribronchial thickening, no infiltrates and heart size normal.  Results for orders placed or performed in visit on 07/24/15  POCT CBC  Result Value Ref Range   WBC 9.1 4.6 - 10.2 K/uL   Lymph, poc 3.3 0.6 - 3.4   POC LYMPH PERCENT 36.5 10 - 50 %L  MID (cbc) 0.4 0 - 0.9   POC MID % 4.5 0 - 12 %M   POC Granulocyte 5.4 2 - 6.9   Granulocyte percent 59.0 37 - 80 %G   RBC 4.92 4.69 - 6.13 M/uL   Hemoglobin 15.6 14.1 - 18.1 g/dL   HCT, POC 45.8 43.5 - 53.7 %   MCV 93.2 80 - 97 fL   MCH, POC 31.8 (A) 27 - 31.2 pg   MCHC 34.2 31.8 - 35.4 g/dL   RDW, POC 14.0 %   Platelet Count, POC 141 (A) 142 - 424 K/uL   MPV 7.3 0 - 99.8 fL     ASSESSMENT AND PLAN:  55 y.o. year old male with Cough - Plan: DG Chest 2 View, POCT CBC  Dyspnea - Plan: DG Chest 2 View, POCT CBC  Chronic nonintractable headache, unspecified headache type - Plan: clonazePAM (KLONOPIN) 0.5 MG tablet  Anxiety state - Plan: mirtazapine (REMERON) 30 MG tablet  This chart was  scribed in my presence and reviewed by me personally.     Signed, William Haber, MD 07/24/2015 3:27 PM

## 2015-07-24 NOTE — Patient Instructions (Addendum)
Your blood count is now normal. Your chest x-ray is now normal.  I'm asking you to continue the current medicines for symptomatic relief. You should be able to return to usual activities this week, perhaps as early as Tuesday.  I have refilled her clonazepam and Remeron.      IF you received an x-ray today, you will receive an invoice from [facility]. Please contact [facility] at [phone] with questions or concerns regarding your invoice.   IF you received labwork today, you will receive an invoice from [employer]. Please contact [facility] at [phone] with questions or concerns regarding your invoice.   Our billing staff will not be able to assist you with questions regarding bills from these companies.  You will be contacted with the lab results as soon as they are available. The fastest way to get your results is to activate your [name] account. Instructions are located on the last page of this paperwork. If you have not heard from [place] regarding the results in 2 weeks, please contact this office.

## 2015-07-26 ENCOUNTER — Encounter: Payer: Self-pay | Admitting: Family Medicine

## 2015-08-10 ENCOUNTER — Encounter: Payer: PPO | Admitting: Family Medicine

## 2015-08-10 ENCOUNTER — Ambulatory Visit (INDEPENDENT_AMBULATORY_CARE_PROVIDER_SITE_OTHER): Payer: PPO | Admitting: Family Medicine

## 2015-08-10 VITALS — BP 118/74 | HR 100 | Temp 97.6°F | Resp 18 | Ht 71.0 in | Wt 157.0 lb

## 2015-08-10 DIAGNOSIS — R3915 Urgency of urination: Secondary | ICD-10-CM

## 2015-08-10 DIAGNOSIS — J302 Other seasonal allergic rhinitis: Secondary | ICD-10-CM | POA: Diagnosis not present

## 2015-08-10 DIAGNOSIS — J111 Influenza due to unidentified influenza virus with other respiratory manifestations: Secondary | ICD-10-CM | POA: Diagnosis not present

## 2015-08-10 LAB — POCT URINALYSIS DIP (MANUAL ENTRY)
Bilirubin, UA: NEGATIVE
Glucose, UA: NEGATIVE
Ketones, POC UA: NEGATIVE
Leukocytes, UA: NEGATIVE
Nitrite, UA: NEGATIVE
Protein Ur, POC: NEGATIVE
Spec Grav, UA: 1.01
Urobilinogen, UA: 0.2
pH, UA: 6.5

## 2015-08-10 LAB — POC MICROSCOPIC URINALYSIS (UMFC): Mucus: ABSENT

## 2015-08-10 MED ORDER — FLUTICASONE PROPIONATE 50 MCG/ACT NA SUSP
2.0000 | Freq: Every day | NASAL | Status: DC
Start: 2015-08-10 — End: 2015-09-14

## 2015-08-10 NOTE — Patient Instructions (Addendum)
The urinalysis is normal. I suspect that some of your urinary urgency in the morning has to do with a prostate getting bigger. I think you're better off not treating at this point because of potential side effects from the medication.  Your lungs are clear today.  I think you can start exercising more and gradually add more distance every couple days and gradually return to your regular routine.

## 2015-08-10 NOTE — Progress Notes (Signed)
55 yo disabled married man (migraines) who is recovering from the flu.  He also has some early morning urgency.  He has been able to take short walks daily to the mailbox and back.  He also is starting to feel the effects of the pollen with itchy eyes and nasal congestion (no fever)  Objective:  BP 118/74 mmHg  Pulse 100  Temp(Src) 97.6 F (36.4 C) (Oral)  Resp 18  Ht 5\' 11"  (1.803 m)  Wt 157 lb (71.215 kg)  BMI 21.91 kg/m2  SpO2 98% NAD HEENT:  Unremarkable TM's, nasal passages, oropharynx Neck:  Supple, no adenopathy Chest:  Clear Heart: regular, no murmur Results for orders placed or performed in visit on 08/10/15  POCT urinalysis dipstick  Result Value Ref Range   Color, UA yellow yellow   Clarity, UA clear clear   Glucose, UA negative negative   Bilirubin, UA negative negative   Ketones, POC UA negative negative   Spec Grav, UA 1.010    Blood, UA trace-lysed (A) negative   pH, UA 6.5    Protein Ur, POC negative negative   Urobilinogen, UA 0.2    Nitrite, UA Negative Negative   Leukocytes, UA Negative Negative  POCT Microscopic Urinalysis ([facility])  Result Value Ref Range   WBC,UR,HPF,POC None None WBC/hpf   RBC,UR,HPF,POC None None RBC/hpf   Bacteria None None, Too numerous to count   Mucus Absent Absent   Epithelial Cells, UR Per Microscopy Few (A) None, Too numerous to count cells/hpf     Assessment: Urinary urgency - Plan: POCT urinalysis dipstick, POCT Microscopic Urinalysis ([facility])  Influenza  Seasonal allergies - Plan: fluticasone (FLONASE) 50 MCG/ACT nasal spray  [name], MD

## 2015-08-18 ENCOUNTER — Ambulatory Visit: Payer: PPO | Admitting: Family Medicine

## 2015-09-14 ENCOUNTER — Ambulatory Visit (INDEPENDENT_AMBULATORY_CARE_PROVIDER_SITE_OTHER): Payer: PPO | Admitting: Emergency Medicine

## 2015-09-14 ENCOUNTER — Ambulatory Visit (INDEPENDENT_AMBULATORY_CARE_PROVIDER_SITE_OTHER): Payer: PPO

## 2015-09-14 ENCOUNTER — Telehealth: Payer: Self-pay

## 2015-09-14 VITALS — BP 122/70 | HR 102 | Temp 97.7°F | Resp 16 | Ht 71.0 in | Wt 152.0 lb

## 2015-09-14 DIAGNOSIS — R059 Cough, unspecified: Secondary | ICD-10-CM

## 2015-09-14 DIAGNOSIS — R05 Cough: Secondary | ICD-10-CM

## 2015-09-14 DIAGNOSIS — J189 Pneumonia, unspecified organism: Secondary | ICD-10-CM

## 2015-09-14 LAB — POCT CBC
GRANULOCYTE PERCENT: 84.4 % — AB (ref 37–80)
HCT, POC: 44.2 % (ref 43.5–53.7)
HEMOGLOBIN: 15.2 g/dL (ref 14.1–18.1)
Lymph, poc: 1.2 (ref 0.6–3.4)
MCH, POC: 32 pg — AB (ref 27–31.2)
MCHC: 34.5 g/dL (ref 31.8–35.4)
MCV: 92.7 fL (ref 80–97)
MID (cbc): 0.7 (ref 0–0.9)
MPV: 7.5 fL (ref 0–99.8)
PLATELET COUNT, POC: 215 10*3/uL (ref 142–424)
POC Granulocyte: 10.4 — AB (ref 2–6.9)
POC LYMPH %: 9.6 % — AB (ref 10–50)
POC MID %: 6 %M (ref 0–12)
RBC: 4.77 M/uL (ref 4.69–6.13)
RDW, POC: 13.4 %
WBC: 12.3 10*3/uL — AB (ref 4.6–10.2)

## 2015-09-14 LAB — POCT INFLUENZA A/B
Influenza A, POC: NEGATIVE
Influenza B, POC: NEGATIVE

## 2015-09-14 MED ORDER — LEVOFLOXACIN 500 MG PO TABS: 500.0000 mg | ORAL_TABLET | Freq: Every day | ORAL | Status: AC

## 2015-09-14 NOTE — Telephone Encounter (Signed)
Dr. Everlene Farrier discussed with patient and his wife that we we are very busy today and that is why the patient's wife had to wait if she still wanted to see Dr. Tamala Julian.  He advised patient's wife that she could be seen now if she wanted to see a PA.  Mrs. Aldridge declined seeing a PA and said she would see her primary physician this afternoon.

## 2015-09-14 NOTE — Progress Notes (Addendum)
By signing my name below, I, [name], attest that this documentation has been prepared under the direction and in the presence of Arlyss Queen, MD. Electronically Signed: [name], Flora. 09/14/2015 , 10:31 AM .  Patient was seen in room 3 .  Chief Complaint:  Chief Complaint  Patient presents with  . URI  . Sore Throat  . Cough    HPI: William [name] is a 55 y.o. male who reports to [facility] today complaining of URI symptoms including sore throat and cough that started 5 days ago. Patient states his illness initially started with a cough. Then he started having chills and had terrible night sweats last night. He informs his throat feeling raw and chest burning. He's also developed productive cough with yellowish-green mucus. His wife has similar symptoms and is in the office today too.   He mentions having similar symptoms when he had the flu which developed into pneumonia towards the end of 2016.   Past Medical History  Diagnosis Date  . Allergy   . Depression   . Migraine headache   . Anxiety   . Mitral valve prolapse   . Heart murmur    No past surgical history on file. Social History   Social History  . Marital Status: Married    Spouse Name: N/A  . Number of Children: N/A  . Years of Education: N/A   Social History Main Topics  . Smoking status: Never Smoker   . Smokeless tobacco: None  . Alcohol Use: No  . Drug Use: No  . Sexual Activity:    Partners: Female   Other Topics Concern  . None   Social History Narrative   Family History  Problem Relation Age of Onset  . Hypertension Mother   . Hypertension Brother    Allergies  Allergen Reactions  . Erythromycin Nausea And Vomiting and Anaphylaxis  . Cephalexin Diarrhea and Nausea And Vomiting  . Cephalosporins Diarrhea and Nausea And Vomiting  . Compazine [Prochlorperazine Edisylate]     uncontrollable shake - tremor  . Hydrocodone-Acetaminophen Nausea And Vomiting  . Ketoconazole  Itching  . Ketorolac Nausea And Vomiting  . Ketorolac Tromethamine     REACTION: Reaction not known  . Latex Itching    REACTION: Reaction not known  . Nalbuphine Other (See Comments)    States loss of consciousness REACTION: Reaction not known  . Oxycodone-Acetaminophen Other (See Comments) and Swelling  . Prednisone Nausea And Vomiting  . Prochlorperazine Edisylate     REACTION: shakes/tremors  . Topamax [Topiramate] Diarrhea and Nausea And Vomiting  . Tramadol Nausea And Vomiting  . Cefdinir Diarrhea, Nausea And Vomiting and Rash  . Neosporin [Neomycin-Bacitracin Zn-Polymyx] Rash   Prior to Admission medications   Medication Sig Start Date End Date Taking? Authorizing Provider  Albuterol Sulfate (PROAIR RESPICLICK) 123XX123 (90 Base) MCG/ACT AEPB Inhale 2 puffs into the lungs every 6 (six) hours as needed. Patient not taking: Reported on 08/10/2015 06/24/15   [name], MD  azelastine (ASTELIN) 0.1 % nasal spray Place 1 spray into both nostrils 2 (two) times daily. Use in each nostril as directed Patient not taking: Reported on 08/10/2015 06/22/15   [name], MD  clonazePAM (KLONOPIN) 0.5 MG tablet Take 1 tablet (0.5 mg total) by mouth daily as needed for anxiety. May take 1/2 tab. 07/24/15   [name], MD  clotrimazole-betamethasone (LOTRISONE) cream Apply 1 application topically 2 (two) times daily. 06/22/15   [name], MD  cyclobenzaprine (  FLEXERIL) 5 MG tablet Take 1 tablet (5 mg total) by mouth 2 (two) times daily as needed for muscle spasms. 05/23/15   [name], MD  fluticasone (FLONASE) 50 MCG/ACT nasal spray Place 2 sprays into both nostrils daily. 08/10/15   [name], MD  ipratropium (ATROVENT) 0.03 % nasal spray Place 2 sprays into the nose 4 (four) times daily. 07/06/15   Shawnee Knapp, MD  lidocaine (LIDODERM) 5 % Place 1 patch onto the skin daily. Remove & Discard patch within 12 hours or as directed by MD 02/08/14   [name], MD  mirtazapine  (REMERON) 30 MG tablet TAKE ONE TABLET AT BEDTIME. 07/24/15   [name], MD  Naproxen Sodium (ALEVE PO) Take by mouth. Reported on 07/03/2015    Historical Provider, MD  olopatadine (PATANOL) 0.1 % ophthalmic solution Place 1 drop into both eyes 2 (two) times daily. 06/22/15   [name], MD  promethazine-dextromethorphan (PROMETHAZINE-DM) 6.25-15 MG/5ML syrup Take 5-10 mLs by mouth 4 (four) times daily as needed for cough. 07/24/15   [name], MD     ROS:  Constitutional: negative for fever, weight changes, or fatigue; positive for chills, night sweats HEENT: negative for vision changes, hearing loss, congestion, rhinorrhea, epistaxis, or sinus pressure; positive for sore throat Cardiovascular: negative for chest pain or palpitations Respiratory: negative for hemoptysis, wheezing, shortness of breath; positive for cough Abdominal: negative for abdominal pain, nausea, vomiting, diarrhea, or constipation Dermatological: negative for rash Neurologic: negative for headache, dizziness, or syncope All other systems reviewed and are otherwise negative with the exception to those above and in the HPI.  PHYSICAL EXAM: Filed Vitals:   09/14/15 0933  BP: 122/70  Pulse: 102  Temp: 97.7 F (36.5 C)  Resp: 16   Body mass index is 21.21 kg/(m^2).   General: Alert, no acute distress HEENT:  Normocephalic, atraumatic, oropharynx patent. [facility]: [facility] Cardiovascular:  Regular rate and rhythm, no rubs murmurs or gallops.  No Carotid bruits, radial pulse intact. No pedal edema.  Respiratory: Frequent episodes of cough but breath sounds symmetrical, Clear to auscultation bilaterally.  No wheezes, rales, or rhonchi.  No cyanosis, no use of accessory musculature Abdominal: No organomegaly, abdomen is soft and non-tender, positive bowel sounds. No masses. Musculoskeletal: Gait intact. No edema, tenderness Skin: No rashes. Neurologic: Facial musculature symmetric. Psychiatric: Patient  acts appropriately throughout our interaction.  Lymphatic: No cervical or submandibular lymphadenopathy Genitourinary/Anorectal: No acute findings  LABS: Results for orders placed or performed in visit on 09/14/15  POCT Influenza A/B  Result Value Ref Range   Influenza A, POC Negative Negative   Influenza B, POC Negative Negative  POCT CBC  Result Value Ref Range   WBC 12.3 (A) 4.6 - 10.2 K/uL   Lymph, poc 1.2 0.6 - 3.4   POC LYMPH PERCENT 9.6 (A) 10 - 50 %L   MID (cbc) 0.7 0 - 0.9   POC MID % 6.0 0 - 12 %M   POC Granulocyte 10.4 (A) 2 - 6.9   Granulocyte percent 84.4 (A) 37 - 80 %G   RBC 4.77 4.69 - 6.13 M/uL   Hemoglobin 15.2 14.1 - 18.1 g/dL   HCT, POC 44.2 43.5 - 53.7 %   MCV 92.7 80 - 97 fL   MCH, POC 32.0 (A) 27 - 31.2 pg   MCHC 34.5 31.8 - 35.4 g/dL   RDW, POC 13.4 %   Platelet Count, POC 215 142 - 424 K/uL   MPV 7.5 0 -  99.8 fL    EKG/XRAY:   Dg Chest 2 View  09/14/2015  CLINICAL DATA:  Cough. EXAM: CHEST  2 VIEW COMPARISON:  07/24/2015.  06/28/2015.  07/20/2013.  07/13/2012. FINDINGS: Mediastinum and hilar structures normal. Heart size normal. Prominent epicardial fat pads are noted. Mild adjacent infiltrate lingula cannot be excluded. No pleural effusion or pneumothorax. IMPRESSION: Prominent epicardial fat pads are noted. Mild adjacent infiltrate in the lingula cannot be completely excluded. Exam otherwise unremarkable . Electronically Signed   By: Marcello Moores  Register   On: 09/14/2015 11:02    ASSESSMENT/PLAN: There is a possible lingular infiltrate. Patient will be on Mucinex twice a day Levaquin for 7 days recheck 1 week.I personally performed the services described in this documentation, which was scribed in my presence. The recorded information has been reviewed and is accurate.  Gross sideeffects, risk and benefits, and alternatives of medications d/w patient. Patient is aware that all medications have potential sideeffects and we are unable to predict every  sideeffect or drug-drug interaction that may occur.  [name] 09/14/2015 10:31 AM

## 2015-09-14 NOTE — Patient Instructions (Addendum)
   IF you received an x-ray today, you will receive an invoice from [facility]. Please contact [facility] at [phone] with questions or concerns regarding your invoice.   IF you received labwork today, you will receive an invoice from [facility]. Please contact [facility] at [phone] with questions or concerns regarding your invoice.   Our billing staff will not be able to assist you with questions regarding bills from these companies.  You will be contacted with the lab results as soon as they are available. The fastest way to get your results is to activate your [name] account. Instructions are located on the last page of this paperwork. If you have not heard from us regarding the results in 2 weeks, please contact this office.      Community-Acquired Pneumonia, Adult Pneumonia is an infection of the lungs. There are different types of pneumonia. One type can develop while a person is in a hospital. A different type, called community-acquired pneumonia, develops in people who are not, or have not recently been, in the hospital or other health care facility.  CAUSES Pneumonia may be caused by bacteria, viruses, or funguses. Community-acquired pneumonia is often caused by Streptococcus pneumonia bacteria. These bacteria are often passed from one person to another by breathing in droplets from the cough or sneeze of an infected person. RISK FACTORS The condition is more likely to develop in:  People who havechronic diseases, such as chronic obstructive pulmonary disease (COPD), asthma, congestive heart failure, cystic fibrosis, diabetes, or kidney disease.  People who haveearly-stage or late-stage HIV.  People who havesickle cell disease.  People who havehad their spleen removed (splenectomy).  People who havepoor dental hygiene.  People who havemedical conditions that increase the risk of breathing in (aspirating)  secretions their own mouth and nose.   People who havea weakened immune system (immunocompromised).  People who smoke.  People whotravel to areas where pneumonia-causing germs commonly exist.  People whoare around animal habitats or animals that have pneumonia-causing germs, including birds, bats, rabbits, cats, and farm animals. SYMPTOMS Symptoms of this condition include:  Adry cough.  A wet (productive) cough.  Fever.  Sweating.  Chest pain, especially when breathing deeply or coughing.  Rapid breathing or difficulty breathing.  Shortness of breath.  Shaking chills.  Fatigue.  Muscle aches. DIAGNOSIS Your health care provider will take a medical history and perform a physical exam. You may also have other tests, including:  Imaging studies of your chest, including X-rays.  Tests to check your blood oxygen level and other blood gases.  Other tests on blood, mucus (sputum), fluid around your lungs (pleural fluid), and urine. If your pneumonia is severe, other tests may be done to identify the specific cause of your illness. TREATMENT The type of treatment that you receive depends on many factors, such as the cause of your pneumonia, the medicines you take, and other medical conditions that you have. For most adults, treatment and recovery from pneumonia may occur at home. In some cases, treatment must happen in a hospital. Treatment may include:  Antibiotic medicines, if the pneumonia was caused by bacteria.  Antiviral medicines, if the pneumonia was caused by a virus.  Medicines that are given by mouth or through an IV tube.  Oxygen.  Respiratory therapy. Although rare, treating severe pneumonia may include:  Mechanical ventilation. This is done if you are not breathing well on your own and you cannot maintain a safe blood oxygen level.    Thoracentesis. This procedureremoves fluid around one lung or both lungs to help you breathe better. HOME CARE  INSTRUCTIONS  Take over-the-counter and prescription medicines only as told by your health care provider.  Only takecough medicine if you are losing sleep. Understand that cough medicine can prevent your body's natural ability to remove mucus from your lungs.  If you were prescribed an antibiotic medicine, take it as told by your health care provider. Do not stop taking the antibiotic even if you start to feel better.  Sleep in a semi-upright position at night. Try sleeping in a reclining chair, or place a few pillows under your head.  Do not use tobacco products, including cigarettes, chewing tobacco, and e-cigarettes. If you need help quitting, ask your health care provider.  Drink enough water to keep your urine clear or pale yellow. This will help to thin out mucus secretions in your lungs. PREVENTION There are ways that you can decrease your risk of developing community-acquired pneumonia. Consider getting a pneumococcal vaccine if:  You are older than 56 years of age.  You are older than 56 years of age and are undergoing cancer treatment, have chronic lung disease, or have other medical conditions that affect your immune system. Ask your health care provider if this applies to you. There are different types and schedules of pneumococcal vaccines. Ask your health care provider which vaccination option is best for you. You may also prevent community-acquired pneumonia if you take these actions:  Get an influenza vaccine every year. Ask your health care provider which type of influenza vaccine is best for you.  Go to the dentist on a regular basis.  Wash your hands often. Use hand sanitizer if soap and water are not available. SEEK MEDICAL CARE IF:  You have a fever.  You are losing sleep because you cannot control your cough with cough medicine. SEEK IMMEDIATE MEDICAL CARE IF:  You have worsening shortness of breath.  You have increased chest pain.  Your sickness becomes  worse, especially if you are an older adult or have a weakened immune system.  You cough up blood.   This information is not intended to replace advice given to you by your health care provider. Make sure you discuss any questions you have with your health care provider.   Document Released: 04/16/2005 Document Revised: 01/05/2015 Document Reviewed: 08/11/2014 Elsevier Interactive Patient Education 2016 Elsevier Inc[employer]  

## 2015-09-20 DIAGNOSIS — Z0000 Encounter for general adult medical examination without abnormal findings: Secondary | ICD-10-CM | POA: Diagnosis not present

## 2015-09-20 DIAGNOSIS — E559 Vitamin D deficiency, unspecified: Secondary | ICD-10-CM | POA: Diagnosis not present

## 2015-09-20 DIAGNOSIS — R5383 Other fatigue: Secondary | ICD-10-CM | POA: Diagnosis not present

## 2015-09-20 DIAGNOSIS — J189 Pneumonia, unspecified organism: Secondary | ICD-10-CM | POA: Diagnosis not present

## 2015-09-22 DIAGNOSIS — Z125 Encounter for screening for malignant neoplasm of prostate: Secondary | ICD-10-CM | POA: Diagnosis not present

## 2015-09-22 DIAGNOSIS — E782 Mixed hyperlipidemia: Secondary | ICD-10-CM | POA: Diagnosis not present

## 2015-09-22 DIAGNOSIS — K3184 Gastroparesis: Secondary | ICD-10-CM | POA: Diagnosis not present

## 2015-09-22 DIAGNOSIS — G43909 Migraine, unspecified, not intractable, without status migrainosus: Secondary | ICD-10-CM | POA: Diagnosis not present

## 2015-09-22 DIAGNOSIS — Z0000 Encounter for general adult medical examination without abnormal findings: Secondary | ICD-10-CM | POA: Diagnosis not present

## 2015-09-22 DIAGNOSIS — Z8701 Personal history of pneumonia (recurrent): Secondary | ICD-10-CM | POA: Diagnosis not present

## 2015-09-22 DIAGNOSIS — E559 Vitamin D deficiency, unspecified: Secondary | ICD-10-CM | POA: Diagnosis not present

## 2015-10-05 ENCOUNTER — Ambulatory Visit: Payer: Medicare Other | Admitting: Neurology

## 2015-10-06 ENCOUNTER — Encounter: Payer: Self-pay | Admitting: Family Medicine

## 2015-10-06 ENCOUNTER — Ambulatory Visit (INDEPENDENT_AMBULATORY_CARE_PROVIDER_SITE_OTHER): Payer: PPO | Admitting: Family Medicine

## 2015-10-06 ENCOUNTER — Ambulatory Visit (INDEPENDENT_AMBULATORY_CARE_PROVIDER_SITE_OTHER): Payer: PPO

## 2015-10-06 VITALS — BP 106/72 | HR 95 | Temp 97.7°F | Resp 20 | Ht 70.5 in | Wt 151.1 lb

## 2015-10-06 DIAGNOSIS — J189 Pneumonia, unspecified organism: Secondary | ICD-10-CM

## 2015-10-06 DIAGNOSIS — G8929 Other chronic pain: Secondary | ICD-10-CM

## 2015-10-06 DIAGNOSIS — R05 Cough: Secondary | ICD-10-CM

## 2015-10-06 DIAGNOSIS — R059 Cough, unspecified: Secondary | ICD-10-CM

## 2015-10-06 DIAGNOSIS — R51 Headache: Secondary | ICD-10-CM | POA: Diagnosis not present

## 2015-10-06 LAB — POCT CBC
Granulocyte percent: 57.7 %G (ref 37–80)
HEMATOCRIT: 43.7 % (ref 43.5–53.7)
HEMOGLOBIN: 15.3 g/dL (ref 14.1–18.1)
LYMPH, POC: 2.4 (ref 0.6–3.4)
MCH, POC: 31.8 pg — AB (ref 27–31.2)
MCHC: 35.1 g/dL (ref 31.8–35.4)
MCV: 90.7 fL (ref 80–97)
MID (cbc): 0.4 (ref 0–0.9)
MPV: 7.7 fL (ref 0–99.8)
POC Granulocyte: 3.8 (ref 2–6.9)
POC LYMPH %: 36.7 % (ref 10–50)
POC MID %: 5.6 %M (ref 0–12)
Platelet Count, POC: 236 10*3/uL (ref 142–424)
RBC: 4.82 M/uL (ref 4.69–6.13)
RDW, POC: 13.6 %
WBC: 6.5 10*3/uL (ref 4.6–10.2)

## 2015-10-06 LAB — POCT SEDIMENTATION RATE: POCT SED RATE: 4 mm/h (ref 0–22)

## 2015-10-06 MED ORDER — FLUTICASONE PROPIONATE 50 MCG/ACT NA SUSP: 2.0000 | Freq: Every day | NASAL | Status: DC

## 2015-10-06 MED ORDER — OMEPRAZOLE 20 MG PO CPDR
20.0000 mg | DELAYED_RELEASE_CAPSULE | Freq: Every day | ORAL | Status: DC
Start: 2015-10-06 — End: 2015-12-31

## 2015-10-06 MED ORDER — PROMETHAZINE-DM 6.25-15 MG/5ML PO SYRP
5.0000 mL | ORAL_SOLUTION | Freq: Every evening | ORAL | Status: DC | PRN
Start: 2015-10-06 — End: 2015-12-31

## 2015-10-06 NOTE — Patient Instructions (Addendum)
Decrease your klonopin to 1/2 tab by mouth every night for 2 weeks. Then take 1/2 tab every other night (alternating with no medication) for 8 days, then stop the klonopin.  Meds ordered this encounter  Medications  . promethazine-dextromethorphan (PROMETHAZINE-DM) 6.25-15 MG/5ML syrup    Sig: Take 5 mLs by mouth at bedtime as needed and may repeat dose one time if needed for cough.    Dispense:  180 mL    Refill:  0  . omeprazole (PRILOSEC) 20 MG capsule    Sig: Take 1 capsule (20 mg total) by mouth daily. 30 minutes prior to dinner    Dispense:  30 capsule    Refill:  1  . fluticasone (FLONASE) 50 MCG/ACT nasal spray    Sig: Place 2 sprays into both nostrils at bedtime. For 2-3 weeks    Dispense:  16 g    Refill:  0     IF you received an x-ray today, you will receive an invoice from [facility]. Please contact [facility] at [phone] with questions or concerns regarding your invoice.   IF you received labwork today, you will receive an invoice from [employer]. Please contact [facility] at [phone] with questions or concerns regarding your invoice.   Our billing staff will not be able to assist you with questions regarding bills from these companies.  You will be contacted with the lab results as soon as they are available. The fastest way to get your results is to activate your [name] account. Instructions are located on the last page of this paperwork. If you have not heard from [place] regarding the results in 2 weeks, please contact this office.    Dg Chest 2 View  10/06/2015  CLINICAL DATA:  History of pneumonia, followup . EXAM: CHEST  2 VIEW COMPARISON:  Chest x-ray of 09/14/2015 and 07/24/2015. FINDINGS: no active infiltrate or effusion is seen. Mild epicardial fat pads create minimal opacity at both costophrenic angles but are completely stable. Mediastinal and hilar contours are unremarkable. The heart is within normal limits in  size. No bony abnormality is seen IMPRESSION: No active cardiopulmonary disease. Electronically Signed   By: Ivar Drape M.D.   On: 10/06/2015 14:07   Dg Chest 2 View  09/14/2015  CLINICAL DATA:  Cough. EXAM: CHEST  2 VIEW COMPARISON:  07/24/2015.  06/28/2015.  07/20/2013.  07/13/2012. FINDINGS: Mediastinum and hilar structures normal. Heart size normal. Prominent epicardial fat pads are noted. Mild adjacent infiltrate lingula cannot be excluded. No pleural effusion or pneumothorax. IMPRESSION: Prominent epicardial fat pads are noted. Mild adjacent infiltrate in the lingula cannot be completely excluded. Exam otherwise unremarkable . Electronically Signed   By: Marcello Moores  Register   On: 09/14/2015 11:02     Cough, Adult Coughing is a reflex that clears your throat and your airways. Coughing helps to heal and protect your lungs. It is normal to cough occasionally, but a cough that happens with other symptoms or lasts a long time may be a sign of a condition that needs treatment. A cough may last only 2-3 weeks (acute), or it may last longer than 8 weeks (chronic). CAUSES Coughing is commonly caused by:  Breathing in substances that irritate your lungs.  A viral or bacterial respiratory infection.  Allergies.  Asthma.  Postnasal drip.  Smoking.  Acid backing up from the stomach into the esophagus (gastroesophageal reflux).  Certain medicines.  Chronic lung problems, including COPD (or rarely, lung cancer).  Other medical  conditions such as heart failure. HOME CARE INSTRUCTIONS  Pay attention to any changes in your symptoms. Take these actions to help with your discomfort:  Take medicines only as told by your health care provider.  If you were prescribed an antibiotic medicine, take it as told by your health care provider. Do not stop taking the antibiotic even if you start to feel better.  Talk with your health care provider before you take a cough suppressant medicine.  Drink  enough fluid to keep your urine clear or pale yellow.  If the air is dry, use a cold steam vaporizer or humidifier in your bedroom or your home to help loosen secretions.  Avoid anything that causes you to cough at work or at home.  If your cough is worse at night, try sleeping in a semi-upright position.  Avoid cigarette smoke. If you smoke, quit smoking. If you need help quitting, ask your health care provider.  Avoid caffeine.  Avoid alcohol.  Rest as needed. SEEK MEDICAL CARE IF:   You have new symptoms.  You cough up pus.  Your cough does not get better after 2-3 weeks, or your cough gets worse.  You cannot control your cough with suppressant medicines and you are losing sleep.  You develop pain that is getting worse or pain that is not controlled with pain medicines.  You have a fever.  You have unexplained weight loss.  You have night sweats. SEEK IMMEDIATE MEDICAL CARE IF:  You cough up blood.  You have difficulty breathing.  Your heartbeat is very fast.   This information is not intended to replace advice given to you by your health care provider. Make sure you discuss any questions you have with your health care provider.   Document Released: 10/13/2010 Document Revised: 01/05/2015 Document Reviewed: 06/23/2014 Elsevier Interactive Patient Education [employer].

## 2015-10-06 NOTE — Progress Notes (Signed)
Subjective:    Patient ID: William [name], male    DOB: 1960/09/29, 55 y.o.   MRN: [id number] Chief Complaint  Patient presents with  . Follow-up    pt stated that the headaches are about the same.    . Medication Refill    refill of klonopin    HPI William [name] is a delightful 55 yo male who was seen here 3 weeks ago and diagnosed with CAPna due to concern for lingular infiltrate and so placed on levaquin.  C/o burning cessation in lungs.  He was stating that this was caught in Feb and he has had temporary periods of wellness in between but keeps in getting in.  He is tapering himself off of klonopin down to 0.5 from 2.0mg .  This was started in 1996 for migraines.   He was last prescribed klonopin by his prior PCP here.  He is still having a lot of migraines. He has also been on remoeron and topamax for this but the topamax had to tear up his stomach. Is taking one klonopin at night and was hoping to take it in half.  Last seen by Dr. Tomi Likens 2 yrs prior but he has an appointment with him coming up. He has an appt to est primary care over at   Past Medical History  Diagnosis Date  . Allergy   . Depression   . Migraine headache   . Anxiety   . Mitral valve prolapse   . Heart murmur    History reviewed. No pertinent past surgical history. Current Outpatient Prescriptions on File Prior to Visit  Medication Sig Dispense Refill  . clonazePAM (KLONOPIN) 0.5 MG tablet Take 1 tablet (0.5 mg total) by mouth daily as needed for anxiety. May take 1/2 tab. 90 tablet 1  . lidocaine (LIDODERM) 5 % Place 1 patch onto the skin daily. Remove & Discard patch within 12 hours or as directed by MD 30 patch 0  . mirtazapine (REMERON) 30 MG tablet TAKE ONE TABLET AT BEDTIME. 90 tablet 3  . Naproxen Sodium (ALEVE PO) Take by mouth. Reported on 07/03/2015     No current facility-administered medications on file prior to visit.   Allergies  Allergen Reactions  . Erythromycin Nausea And Vomiting  and Anaphylaxis  . Cephalexin Diarrhea and Nausea And Vomiting  . Cephalosporins Diarrhea and Nausea And Vomiting  . Compazine [Prochlorperazine Edisylate]     uncontrollable shake - tremor  . Hydrocodone-Acetaminophen Nausea And Vomiting  . Ketoconazole Itching  . Ketorolac Nausea And Vomiting  . Ketorolac Tromethamine     REACTION: Reaction not known  . Latex Itching    REACTION: Reaction not known  . Nalbuphine Other (See Comments)    States loss of consciousness REACTION: Reaction not known  . Oxycodone-Acetaminophen Other (See Comments) and Swelling  . Prednisone Nausea And Vomiting  . Prochlorperazine Edisylate     REACTION: shakes/tremors  . Topamax [Topiramate] Diarrhea and Nausea And Vomiting  . Tramadol Nausea And Vomiting  . Cefdinir Diarrhea, Nausea And Vomiting and Rash  . Neosporin [Neomycin-Bacitracin Zn-Polymyx] Rash   Family History  Problem Relation Age of Onset  . Hypertension Mother   . Hypertension Brother    Social History   Social History  . Marital Status: Married    Spouse Name: N/A  . Number of Children: N/A  . Years of Education: N/A   Social History Main Topics  . Smoking status: Never Smoker   . Smokeless tobacco: None  .  Alcohol Use: No  . Drug Use: No  . Sexual Activity:    Partners: Female   Other Topics Concern  . None   Social History Narrative     Review of Systems See HPI    Objective:  BP 106/72 mmHg  Pulse 95  Resp 20  Ht 5' 10.5" (1.791 m)  Wt 151 lb 2 oz (68.55 kg)  BMI 21.37 kg/m2  SpO2 98%  Physical Exam  Constitutional: He is oriented to person, place, and time. He appears well-developed and well-nourished. No distress.  HENT:  Head: Normocephalic and atraumatic.  Right Ear: Tympanic membrane, external ear and ear canal normal.  Left Ear: Tympanic membrane, external ear and ear canal normal.  Nose: Nose normal.  Mouth/Throat: Oropharynx is clear and moist and mucous membranes are normal. No oropharyngeal  exudate.  Eyes: Conjunctivae are normal. No scleral icterus.  Neck: Normal range of motion. Neck supple. No thyromegaly present.  Cardiovascular: Normal rate, regular rhythm, normal heart sounds and intact distal pulses.   Pulmonary/Chest: Effort normal and breath sounds normal. No respiratory distress.  Musculoskeletal: He exhibits no edema.  Lymphadenopathy:    He has no cervical adenopathy.  Neurological: He is alert and oriented to person, place, and time.  Skin: Skin is warm and dry. He is not diaphoretic. No erythema.  Psychiatric: He has a normal mood and affect. His behavior is normal.      Dg Chest 2 View  10/06/2015  CLINICAL DATA:  History of pneumonia, followup . EXAM: CHEST  2 VIEW COMPARISON:  Chest x-ray of 09/14/2015 and 07/24/2015. FINDINGS: no active infiltrate or effusion is seen. Mild epicardial fat pads create minimal opacity at both costophrenic angles but are completely stable. Mediastinal and hilar contours are unremarkable. The heart is within normal limits in size. No bony abnormality is seen IMPRESSION: No active cardiopulmonary disease. Electronically Signed   By: Ivar Drape M.D.   On: 10/06/2015 14:07       Results for orders placed or performed in visit on 10/06/15  POCT CBC  Result Value Ref Range   WBC 6.5 4.6 - 10.2 K/uL   Lymph, poc 2.4 0.6 - 3.4   POC LYMPH PERCENT 36.7 10 - 50 %L   MID (cbc) 0.4 0 - 0.9   POC MID % 5.6 0 - 12 %M   POC Granulocyte 3.8 2 - 6.9   Granulocyte percent 57.7 37 - 80 %G   RBC 4.82 4.69 - 6.13 M/uL   Hemoglobin 15.3 14.1 - 18.1 g/dL   HCT, POC 43.7 43.5 - 53.7 %   MCV 90.7 80 - 97 fL   MCH, POC 31.8 (A) 27 - 31.2 pg   MCHC 35.1 31.8 - 35.4 g/dL   RDW, POC 13.6 %   Platelet Count, POC 236 142 - 424 K/uL   MPV 7.7 0 - 99.8 fL  POCT SEDIMENTATION RATE  Result Value Ref Range   POCT SED RATE 4 0 - 22 mm/hr    Assessment & Plan:  Check cxr to ensure cleared from 3 wks prior  William [name] has an appointment to  establish with Dr. Martinique at [facility] in 1 wk 1. Recurrent pneumonia   2. Cough   3. Chronic nonintractable headache, unspecified headache type    rec decreasing klonopin to 1/2 tab qhs x 2 wks, then qod x 8d, then stop.  Orders Placed This Encounter  Procedures  . DG Chest 2 View    Standing  Status: Future     Number of Occurrences: 1     Standing Expiration Date: 10/05/2016    Order Specific Question:  Reason for Exam (SYMPTOM  OR DIAGNOSIS REQUIRED)    Answer:  follow-up pneumonia on chest  x-ray    Order Specific Question:  Preferred imaging location?    Answer:  External  . POCT CBC  . POCT SEDIMENTATION RATE    Meds ordered this encounter  Medications  . promethazine-dextromethorphan (PROMETHAZINE-DM) 6.25-15 MG/5ML syrup    Sig: Take 5 mLs by mouth at bedtime as needed and may repeat dose one time if needed for cough.    Dispense:  180 mL    Refill:  0  . omeprazole (PRILOSEC) 20 MG capsule    Sig: Take 1 capsule (20 mg total) by mouth daily. 30 minutes prior to dinner    Dispense:  30 capsule    Refill:  1  . fluticasone (FLONASE) 50 MCG/ACT nasal spray    Sig: Place 2 sprays into both nostrils at bedtime. For 2-3 weeks    Dispense:  16 g    Refill:  0    [name], M.D.  [facility] [address] [place], [place] [address] [phone] phone [phone] fax  10/27/2015 10:48 PM

## 2015-10-13 ENCOUNTER — Encounter: Payer: Self-pay | Admitting: Family Medicine

## 2015-10-13 ENCOUNTER — Ambulatory Visit (INDEPENDENT_AMBULATORY_CARE_PROVIDER_SITE_OTHER): Payer: PPO | Admitting: Family Medicine

## 2015-10-13 VITALS — BP 118/78 | HR 81 | Temp 97.9°F | Resp 12 | Ht 70.5 in | Wt 154.2 lb

## 2015-10-13 DIAGNOSIS — R51 Headache: Secondary | ICD-10-CM | POA: Diagnosis not present

## 2015-10-13 DIAGNOSIS — K219 Gastro-esophageal reflux disease without esophagitis: Secondary | ICD-10-CM | POA: Diagnosis not present

## 2015-10-13 DIAGNOSIS — K3184 Gastroparesis: Secondary | ICD-10-CM

## 2015-10-13 DIAGNOSIS — G8929 Other chronic pain: Secondary | ICD-10-CM

## 2015-10-13 NOTE — Progress Notes (Signed)
Pre visit review using our clinic review tool, if applicable. No additional management support is needed unless otherwise documented below in the visit note. 

## 2015-10-13 NOTE — Progress Notes (Signed)
HPI:   Mr.William [name] is a 55 y.o. male, who is here today with his wife to establish care with me.  Former PCP: Has not had one in long time, usually he goes to urgent care.  Last preventive routine visit: Had screening labs in 08/2015. Reporting Hx of GERD and gastroparesis, chronic headaches, HLD, IBS among some.   Concerns today: headaches and reviewing current medications.  Hx of severe recurrent headaches since 1986, has been Dx with migraine and ? of cluster headaches. Frequent ER visits due to headaches.  Left retro-ocular pain, describes it like a "needle stucking in eye" with associated nausea, vomiting, and photophobia.Sometimes tearing and conjunctival injection of left eye. No aura. It can happen at any time, day or night. Headache can lasts hours to days and even months.  He has a mild headache daily, not sure about frequency of severe episodes, states that at least once per week.    Imitrex, BB, CCB, Amitriptyline, Depakote,Gabapentin, and even O2 supplement in the ER among some. Treatments did not help at all, except for supplemental O2 which many times did in the past but headache came back next day. Topamax discontinued in 2011, GI symptoms, it was not helping.  He states that has had extensive work-up. He has appt with another neurologists and suggested to try Botox, appt on 12/02/15. Clonazepam also recommended for migraine, he has been weaning dose and now down to 1/2 daily since 10/06/15.  Remeron also started for headaches many years ago, he was not sleeping well due to headache, sleeping well now. No side effects reported, also would like to start weaning it off.  He is on disability. He denies any hx of depression or anxiety but listed on his medical Hx.   GERD:Recently started on Omeprazole because epigastric burning pain and heartburn, symptoms improved.  Denies current abdominal pain, nausea, vomiting, changes in bowel habits, blood in  stool or melena.  Recently he was treated for pneumonia, still coughing, dry, and mostly at night. No smoker. No fever, chills, myalgias, or abnormal wt loss.  He also reports Hx of gastroparesis, which according to pt, it is unknown etiology. He was on opioid treatment for years to treat headache. Intermittent nausea and vomiting but better since he changed his diet. Tried Reglan before and tolerated well.     Review of Systems  Constitutional: Negative for fever, diaphoresis, activity change, appetite change, fatigue and unexpected weight change.  HENT: Negative for mouth sores, nosebleeds, sore throat and trouble swallowing.   Eyes: Positive for photophobia, pain and redness. Negative for visual disturbance.       Symptoms associated with headaches.  Respiratory: Positive for cough. Negative for apnea, shortness of breath, wheezing and stridor.   Cardiovascular: Negative for chest pain, palpitations and leg swelling.  Gastrointestinal: Negative for nausea, vomiting, abdominal pain and blood in stool.       No changes in bowel habits.  Genitourinary: Negative for dysuria, hematuria and decreased urine volume.  Skin: Negative for rash.  Allergic/Immunologic: Positive for environmental allergies.  Neurological: Positive for headaches. Negative for dizziness, seizures, weakness and numbness.  Hematological: Negative for adenopathy. Does not bruise/bleed easily.  Psychiatric/Behavioral: Negative for suicidal ideas, hallucinations, behavioral problems, confusion and sleep disturbance. The patient is not nervous/anxious.       Current Outpatient Prescriptions on File Prior to Visit  Medication Sig Dispense Refill  . clonazePAM (KLONOPIN) 0.5 MG tablet Take 1 tablet (0.5 mg total) by  mouth daily as needed for anxiety. May take 1/2 tab. 90 tablet 1  . fluticasone (FLONASE) 50 MCG/ACT nasal spray Place 2 sprays into both nostrils at bedtime. For 2-3 weeks 16 g 0  . lidocaine (LIDODERM) 5  % Place 1 patch onto the skin daily. Remove & Discard patch within 12 hours or as directed by MD 30 patch 0  . mirtazapine (REMERON) 30 MG tablet TAKE ONE TABLET AT BEDTIME. 90 tablet 3  . Naproxen Sodium (ALEVE PO) Take by mouth. Reported on 07/03/2015    . omeprazole (PRILOSEC) 20 MG capsule Take 1 capsule (20 mg total) by mouth daily. 30 minutes prior to dinner 30 capsule 1  . promethazine-dextromethorphan (PROMETHAZINE-DM) 6.25-15 MG/5ML syrup Take 5 mLs by mouth at bedtime as needed and may repeat dose one time if needed for cough. 180 mL 0   No current facility-administered medications on file prior to visit.     Past Medical History  Diagnosis Date  . Allergy   . Depression   . Migraine headache   . Anxiety   . Mitral valve prolapse   . Heart murmur    Allergies  Allergen Reactions  . Erythromycin Nausea And Vomiting and Anaphylaxis  . Cephalexin Diarrhea and Nausea And Vomiting  . Cephalosporins Diarrhea and Nausea And Vomiting  . Compazine [Prochlorperazine Edisylate]     uncontrollable shake - tremor  . Hydrocodone-Acetaminophen Nausea And Vomiting  . Ketoconazole Itching  . Ketorolac Nausea And Vomiting  . Ketorolac Tromethamine     REACTION: Reaction not known  . Latex Itching    REACTION: Reaction not known  . Nalbuphine Other (See Comments)    States loss of consciousness REACTION: Reaction not known  . Oxycodone-Acetaminophen Other (See Comments) and Swelling  . Prednisone Nausea And Vomiting  . Prochlorperazine Edisylate     REACTION: shakes/tremors  . Topamax [Topiramate] Diarrhea and Nausea And Vomiting  . Tramadol Nausea And Vomiting  . Cefdinir Diarrhea, Nausea And Vomiting and Rash  . Neosporin [Neomycin-Bacitracin Zn-Polymyx] Rash    Family History  Problem Relation Age of Onset  . Hypertension Mother   . Hypertension Brother     Social History   Social History  . Marital Status: Married    Spouse Name: N/A  . Number of Children: N/A  .  Years of Education: N/A   Social History Main Topics  . Smoking status: Never Smoker   . Smokeless tobacco: None  . Alcohol Use: No  . Drug Use: No  . Sexual Activity:    Partners: Female   Other Topics Concern  . None   Social History Narrative    Filed Vitals:   10/13/15 0850  BP: 118/78  Pulse: 81  Temp: 97.9 F (36.6 C)  Resp: 12    Body mass index is 21.81 kg/(m^2).  SpO2 Readings from Last 3 Encounters:  10/13/15 97%  10/06/15 98%  09/14/15 97%     Physical Exam  Constitutional: He is oriented to person, place, and time. He appears well-developed and well-nourished. No distress.  HENT:  Head: Atraumatic.  Eyes: Conjunctivae and EOM are normal. Pupils are equal, round, and reactive to light.  Cardiovascular: Normal rate and regular rhythm.   No murmur heard. Pulses:      Dorsalis pedis pulses are 2+ on the right side, and 2+ on the left side.  Respiratory: Effort normal and breath sounds normal. No respiratory distress.  GI: Soft. He exhibits no mass. There is no hepatomegaly. There  is no tenderness.  Musculoskeletal: He exhibits no edema.  Lymphadenopathy:    He has no cervical adenopathy.       Right: No supraclavicular adenopathy present.       Left: No supraclavicular adenopathy present.  Neurological: He is alert and oriented to person, place, and time. He has normal strength. No cranial nerve deficit or sensory deficit. Coordination and gait normal.  Skin: Skin is warm. No erythema.  Psychiatric: He has a normal mood and affect.  Well groomed, good eye contact.      ASSESSMENT AND PLAN:     Tyleek was seen today for new patient (initial visit).  Diagnoses and all orders for this visit:  Chronic intractable headache, unspecified headache type  Migraine vs cluster headache. He seems like he has tried several treatments and no major improvement. We discussed a few benefits from Botox and some side effects, vaccines at good option and in  general at safe option.  He is planning on keeping appointment with neurologist, keep a headache diary and take it to his appt.   Gastroesophageal reflux disease without esophagitis  Improved. Some side effects of PPI's discussed, continue it for 3 months.  GERD precautions given. F/U in 2-3 months.   Gastroparesis  Most likely related to chronic opioid use. Continue small and frequent meals. Reglan side effects discussed, he would like to hold on resuming it but will let me know if he decides to try again.      Betty G. Martinique, MD  [facility]. [place] office.

## 2015-10-13 NOTE — Patient Instructions (Addendum)
A few things to remember from today's visit:   1. Chronic intractable headache, unspecified headache type   2. Gastroesophageal reflux disease without esophagitis   3. Gastroparesis:  Think about Reglan.  .  Avoid foods that make your symptoms worse, for example coffee, chocolate,pepermeint,alcohol, and greasy food. Raising the head of your bed about 6 inches may help with nocturnal symptoms.   Avoid lying down for 3 hours after eating.  Instead 3 large meals daily try small and more frequent meals during the day.  Some medications we recommend for acid reflux treatment (proton pump inhibitors) can cause some problems in the long term: increase risk of osteoporosis, vitamin deficiencies,pneumonia, and more recently discovered that it can increase the risk of chronic kidney disease and might increase risk of dementia.  You should be evaluated immediately if bloody vomiting, bloody stools, black stools (like tar), difficulty swallowing, food gets stuck on the way down or choking when eating. Abnormal weight loss or severe abdominal pain.              If you sign-up for [name], you can communicate easier with [place] in case you have any question or concern.

## 2015-10-20 ENCOUNTER — Institutional Professional Consult (permissible substitution): Payer: Medicare Other | Admitting: Internal Medicine

## 2015-10-24 DIAGNOSIS — G43909 Migraine, unspecified, not intractable, without status migrainosus: Secondary | ICD-10-CM | POA: Diagnosis not present

## 2015-10-24 DIAGNOSIS — K3184 Gastroparesis: Secondary | ICD-10-CM | POA: Diagnosis not present

## 2015-10-24 DIAGNOSIS — E559 Vitamin D deficiency, unspecified: Secondary | ICD-10-CM | POA: Diagnosis not present

## 2015-10-24 DIAGNOSIS — F411 Generalized anxiety disorder: Secondary | ICD-10-CM | POA: Diagnosis not present

## 2015-10-24 DIAGNOSIS — M549 Dorsalgia, unspecified: Secondary | ICD-10-CM | POA: Diagnosis not present

## 2015-11-07 DIAGNOSIS — F411 Generalized anxiety disorder: Secondary | ICD-10-CM | POA: Diagnosis not present

## 2015-11-07 DIAGNOSIS — G43909 Migraine, unspecified, not intractable, without status migrainosus: Secondary | ICD-10-CM | POA: Diagnosis not present

## 2015-11-11 ENCOUNTER — Encounter: Payer: Self-pay | Admitting: Neurology

## 2015-11-11 ENCOUNTER — Other Ambulatory Visit (INDEPENDENT_AMBULATORY_CARE_PROVIDER_SITE_OTHER): Payer: PPO

## 2015-11-11 ENCOUNTER — Ambulatory Visit (INDEPENDENT_AMBULATORY_CARE_PROVIDER_SITE_OTHER): Payer: PPO | Admitting: Neurology

## 2015-11-11 VITALS — BP 136/68 | HR 100 | Ht 70.0 in | Wt 152.0 lb

## 2015-11-11 DIAGNOSIS — R51 Headache: Secondary | ICD-10-CM

## 2015-11-11 DIAGNOSIS — R519 Headache, unspecified: Secondary | ICD-10-CM

## 2015-11-11 DIAGNOSIS — G43719 Chronic migraine without aura, intractable, without status migrainosus: Secondary | ICD-10-CM

## 2015-11-11 LAB — SEDIMENTATION RATE: Sed Rate: 18 mm/hr (ref 0–20)

## 2015-11-11 MED ORDER — PREDNISONE 10 MG PO TABS: ORAL_TABLET | ORAL | Status: DC

## 2015-11-11 NOTE — Patient Instructions (Signed)
1.  We will try prednisone 10mg  tablet taper to break the headache.  Take 6tabs x1day, then 5tabs x1day, then 4tabs x1day, then 3tabs x1day, then 2tabs x1day, then 1tab x1day, then STOP If you experience rash or nausea (like previously), then stop.  Take Pepcid with it. 2.  Since these are new onset intractable headaches, we will get MRI of brain and sed rate. 3.  Please review info on Botox.  I highly recommend that you try this. 4.   Limit use of pain relievers to no more than 2 days out of the week.  These medications include acetaminophen, ibuprofen, triptans and narcotics.  This will help reduce risk of rebound headaches. 5.  Be aware of common food triggers such as processed sweets, processed foods with nitrites (such as deli meat, hot dogs, sausages), foods with MSG, alcohol (such as wine), chocolate, certain cheeses, certain fruits (dried fruits, some citrus fruit), vinegar, diet soda. 6.  Avoid caffeine 7.  Routine exercise 8.  Proper sleep hygiene 9.  Stay adequately hydrated with water 10.[name]  Keep a headache diary. 11.  Maintain proper stress management. 12.  Do not skip meals. 13.  Consider supplements:  Magnesium oxide 400mg  to 600mg  daily, riboflavin 400mg , Coenzyme Q 10 100mg  three times daily 14.  Try to find an acupunturist.  15.  Again, my recommendation is Botox.

## 2015-11-11 NOTE — Progress Notes (Signed)
NEUROLOGY FOLLOW UP OFFICE NOTE  William [name] [id number]  HISTORY OF PRESENT ILLNESS: William [name] is a 55 year old right-handed man with hyperlipidemia, MVP, IBS, gastroparesis, generalized anxiety disorder, and palpitations who follows up for migraines.[name]  UPDATE: I saw him in August 2015 for longstanding history of chronic migraines.  Since he had exhausted multiple medications and supplements, as well as biofeedback, I recommended Botox.  He didn't agree to it at the time but I provided him information and advised that he contact us if he chose to pursue it.  He continues to suffer from daily intractable migraines.  He has been slowly weaning off of clonazepam over the past year or two.  About 2 weeks ago, the dose was decreased from 0.5 to 0.25mg .  At that point, he developed a new severe constant headache.  This time, it is right sided, throbbing, 10/10.  It is associated with nausea.  He has taken ibuprofen which does not help.  He denies visual disturbance.  HISTORY: Onset:  1986 Location: 90% of time: Left retro-orbital region, 10% bi-frontal Quality:  stabbing Intensity:  Fluctuates from 4/10 to 10/10 Aura:  no Prodrome:  no Associated symptoms:  Lightheadedness, fatigue, nausea, tearing of left eye. Duration:  Constant but intensity fluctuates Frequency:  constant Triggers/exacerbating factors:  Afran, odors (perfumes, cigarette, chinese food), MSG, salt, stress, extreme temperatures Relieving factors:  water Activity:  About 4 to 5 days a week, cannot function  Past abortive therapy:  indomethacin, nasal O2, DHE protocol, temazepam, hydrocodone, morphine, Zofran, Ultram, Ergomar, Nucynta, nalbuphine, lidocaine patches, Tylox, clonazepam, Phenergan, bupivacaine injection, Migranol, Lidocaine nasal drops, Imitrex po/IM, Maxalt, Relpax, Amerge, Zomig.  Triptans contraindicated due to MVP. Past preventative therapy:  Topamax (effective but weight loss, diarrhea),  verapamil, Depakote, Inderal, metoprolol, Keppra, amitriptyline, nortriptyline, Lyrica, gabapentin, Wellbutrin, Remeron, venlafaxine, CoQ10, petasites, biofeedback, counseling.   Acupuncture was effective.  Many physicians recommended Botox but he has been hesitant to start it.  Caffeine:  no Alcohol:  no Smoker:  no Diet:  Bland diet, low salt, no caffeine Exercise:  No.  Heavy exercise aggravates.  He previously took walks around the block, but the temperature aggravated the headache Depression/stress:  Yes.  Depressed that he cannot work.  He is on permanent disability Sleep hygiene:  Poor.  Family history of headache:  no  He has had multiple imaging of the head, all unremarkable, including:    CT HEAD from 06/01/09 and 07/30/09. MRI BRAIN W/WO from 12/08/02, 01/19/05, 04/18/12  CERVICAL XR from 01/15/08 showed mild degenerative disc disease at C5-6.  He has seen multiple neurologists and headache and pain specialists, including Dr. Morene Antu, Dr. Asencion Partridge Dohmeieir, Dr. Earley Favor, Dr. Melton Alar, Dr. Rock Nephew at [facility] and he was seen at the [facility].    He also has history of some obsessive compulsive symptoms since he was young.  For example, he repeatedly washes his hands and always uses a new cup during the day when he drinks something.  He has history of low blood pressure and has been evaluated by cardiology for palpitations.  Work up was unremarkable.  PAST MEDICAL HISTORY: Past Medical History  Diagnosis Date  . Allergy   . Depression   . Migraine headache   . Anxiety   . Mitral valve prolapse   . Heart murmur   . Gastroparesis     MEDICATIONS: Current Outpatient Prescriptions on File Prior to Visit  Medication Sig Dispense Refill  . clonazePAM (KLONOPIN) 0.5 MG  tablet Take 1 tablet (0.5 mg total) by mouth daily as needed for anxiety. May take 1/2 tab. 90 tablet 1  . fluticasone (FLONASE) 50 MCG/ACT nasal spray Place 2 sprays into both nostrils at bedtime. For 2-3 weeks  16 g 0  . lidocaine (LIDODERM) 5 % Place 1 patch onto the skin daily. Remove & Discard patch within 12 hours or as directed by MD 30 patch 0  . mirtazapine (REMERON) 30 MG tablet TAKE ONE TABLET AT BEDTIME. 90 tablet 3  . Naproxen Sodium (ALEVE PO) Take by mouth. Reported on 07/03/2015    . omeprazole (PRILOSEC) 20 MG capsule Take 1 capsule (20 mg total) by mouth daily. 30 minutes prior to dinner 30 capsule 1  . promethazine-dextromethorphan (PROMETHAZINE-DM) 6.25-15 MG/5ML syrup Take 5 mLs by mouth at bedtime as needed and may repeat dose one time if needed for cough. 180 mL 0   No current facility-administered medications on file prior to visit.    ALLERGIES: Allergies  Allergen Reactions  . Erythromycin Nausea And Vomiting and Anaphylaxis  . Hydrocodone-Acetaminophen Nausea And Vomiting  . Tramadol Nausea And Vomiting  . Cephalexin Diarrhea and Nausea And Vomiting  . Cephalosporins Diarrhea and Nausea And Vomiting  . Compazine [Prochlorperazine Edisylate]     uncontrollable shake - tremor  . Ketoconazole Itching  . Ketorolac Nausea And Vomiting  . Ketorolac Tromethamine     REACTION: Reaction not known  . Latex Itching    REACTION: Reaction not known  . Nalbuphine Other (See Comments)    States loss of consciousness REACTION: Reaction not known  . Oxycodone-Acetaminophen Other (See Comments) and Swelling  . Prednisone Nausea And Vomiting  . Prochlorperazine Edisylate     REACTION: shakes/tremors  . Topamax [Topiramate] Diarrhea and Nausea And Vomiting  . Cefdinir Diarrhea, Nausea And Vomiting and Rash  . Neosporin [Neomycin-Bacitracin Zn-Polymyx] Rash  . Oxycodone-Acetaminophen Other (See Comments)    FAMILY HISTORY: Family History  Problem Relation Age of Onset  . Hypertension Mother   . Hypertension Brother     SOCIAL HISTORY: Social History   Social History  . Marital Status: Married    Spouse Name: N/A  . Number of Children: N/A  . Years of Education: N/A    Occupational History  . Not on file.   Social History Main Topics  . Smoking status: Never Smoker   . Smokeless tobacco: Not on file  . Alcohol Use: No  . Drug Use: No  . Sexual Activity:    Partners: Female   Other Topics Concern  . Not on file   Social History Narrative    REVIEW OF SYSTEMS: Constitutional: No fevers, chills, or sweats, no generalized fatigue, change in appetite Eyes: No visual changes, double vision, eye pain Ear, nose and throat: No hearing loss, ear pain, nasal congestion, sore throat Cardiovascular: No chest pain, palpitations Respiratory:  No shortness of breath at rest or with exertion, wheezes GastrointestinaI: as above.  gastroparesis Genitourinary:  No dysuria, urinary retention or frequency Musculoskeletal:  No neck pain, back pain Integumentary: No rash, pruritus, skin lesions Neurological: as above Psychiatric: depression, insomnia, anxiety Endocrine: No palpitations, fatigue, diaphoresis, mood swings, change in appetite, change in weight, increased thirst Hematologic/Lymphatic:  No purpura, petechiae. Allergic/Immunologic: no itchy/runny eyes, nasal congestion, recent allergic reactions, rashes  PHYSICAL EXAM: Filed Vitals:   11/11/15 1047  BP: 136/68  Pulse: 100   General: No acute distress.  Patient appears well-groomed.  normal body habitus. Head:  Normocephalic/atraumatic Eyes:  Fundi examined but not visualized Neck: supple, no paraspinal tenderness, full range of motion Heart:  Regular rate and rhythm Lungs:  Clear to auscultation bilaterally Back: No paraspinal tenderness Neurological Exam: alert and oriented to person, place, and time. Attention span and concentration intact, recent and remote memory intact, fund of knowledge intact.  Speech fluent and not dysarthric, language intact.  CN II-XII intact. Bulk and tone normal, muscle strength 5/5 throughout.  Sensation to light touch, temperature and vibration intact.  Deep  tendon reflexes 2+ throughout, toes downgoing.  Finger to nose and heel to shin testing intact.  Gait normal, Romberg negative.  IMPRESSION: 1.  New-onset intractable headache.  Possibly triggered by clonazepam withdrawal, despite slow taper.   2.  Chronic migraine  PLAN: 1.  Since these are new intractable headaches in a man over 89 years old, we will get MRI of brain and check a sed rate. 2.  I will prescribe him prednisone taper to try and break current headache.  He says he cannot tolerate Toradol injections. 3.  Lifestyle modification discussed (exercise, hydration, sleep, stress reduction, and supplements such as magnesium, riboflavin and coenzyme Q10). 4.  I again reiterated that Botox is the only option I can offer (as he has failed or has side effects to other therapies).  I provided him information to review.  I also advised to find another acupuncturist, as this was helpful. 5.  He will let me know about botox and we can schedule him.  25 minutes spent face to face with patient, over 50% spent counseling.  Metta Clines, DO  CC:  [name], MD

## 2015-11-11 NOTE — Progress Notes (Signed)
Chart forwarded.  

## 2015-11-14 ENCOUNTER — Telehealth: Payer: Self-pay

## 2015-11-14 NOTE — Telephone Encounter (Signed)
Sent via mychart

## 2015-11-14 NOTE — Telephone Encounter (Signed)
-----   Message from Pieter Partridge, DO sent at 11/13/2015  3:53 PM EDT ----- Lab is normal

## 2015-11-19 ENCOUNTER — Ambulatory Visit
Admission: RE | Admit: 2015-11-19 | Discharge: 2015-11-19 | Disposition: A | Discharge status: Home / Self Care | Payer: PPO | Source: Ambulatory Visit | Attending: Neurology | Admitting: Neurology

## 2015-11-19 DIAGNOSIS — R51 Headache: Principal | ICD-10-CM

## 2015-11-19 DIAGNOSIS — G43909 Migraine, unspecified, not intractable, without status migrainosus: Secondary | ICD-10-CM | POA: Diagnosis not present

## 2015-11-19 DIAGNOSIS — R519 Headache, unspecified: Secondary | ICD-10-CM

## 2015-11-21 ENCOUNTER — Telehealth: Payer: Self-pay

## 2015-11-21 NOTE — Telephone Encounter (Signed)
-----   Message from Pieter Partridge, DO sent at 11/21/2015  6:59 AM EDT ----- MRI is normal

## 2015-11-21 NOTE — Telephone Encounter (Signed)
Results sent via my chart 

## 2015-12-02 ENCOUNTER — Ambulatory Visit: Payer: Medicare Other | Admitting: Neurology

## 2015-12-24 ENCOUNTER — Ambulatory Visit (INDEPENDENT_AMBULATORY_CARE_PROVIDER_SITE_OTHER): Payer: PPO | Admitting: Emergency Medicine

## 2015-12-24 VITALS — BP 122/72 | HR 97 | Temp 98.1°F | Resp 17 | Ht 71.5 in | Wt 152.0 lb

## 2015-12-24 DIAGNOSIS — R21 Rash and other nonspecific skin eruption: Secondary | ICD-10-CM

## 2015-12-24 DIAGNOSIS — N50812 Left testicular pain: Secondary | ICD-10-CM | POA: Diagnosis not present

## 2015-12-24 LAB — POCT SKIN KOH: SKIN KOH, POC: NEGATIVE

## 2015-12-24 NOTE — Progress Notes (Signed)
By signing my name below, I, William [name], attest that this documentation has been prepared under the direction and in the presence of William Jordan, MD. Electronically Signed: Judithe [name], ER Scribe. 12/24/2015. 11:12 AM.  Chief Complaint:  Chief Complaint  Patient presents with  . Rash    on groin    HPI: William [name] is a 55 y.o. male who reports to [facility] today complaining of pain in his left testicle that has been ongoing for the last year, but worsened over the last few weeks. He denies dysuria or penile discharge. His last prostate check was in May, 2017. His last PSA was normal. He has tried many OTC creams without relief.    Past Medical History:  Diagnosis Date  . Allergy   . Anxiety   . Depression   . Gastroparesis   . Heart murmur   . Migraine headache   . Mitral valve prolapse    No past surgical history on file. Social History   Social History  . Marital status: Married    Spouse name: N/A  . Number of children: N/A  . Years of education: N/A   Social History Main Topics  . Smoking status: Never Smoker  . Smokeless tobacco: Never Used  . Alcohol use No  . Drug use: No  . Sexual activity: Yes    Partners: Female   Other Topics Concern  . None   Social History Narrative  . None   Family History  Problem Relation Age of Onset  . Hypertension Mother   . Hypertension Brother    Allergies  Allergen Reactions  . Erythromycin Nausea And Vomiting and Anaphylaxis  . Hydrocodone-Acetaminophen Nausea And Vomiting  . Tramadol Nausea And Vomiting  . Cephalexin Diarrhea and Nausea And Vomiting  . Cephalosporins Diarrhea and Nausea And Vomiting  . Compazine [Prochlorperazine Edisylate]     uncontrollable shake - tremor  . Ketoconazole Itching  . Ketorolac Nausea And Vomiting  . Ketorolac Tromethamine     REACTION: Reaction not known  . Latex Itching    REACTION: Reaction not known  . Nalbuphine Other (See Comments)    States loss of  consciousness REACTION: Reaction not known  . Oxycodone-Acetaminophen Other (See Comments) and Swelling  . Prednisone Nausea And Vomiting  . Prochlorperazine Edisylate     REACTION: shakes/tremors  . Topamax [Topiramate] Diarrhea and Nausea And Vomiting  . Cefdinir Diarrhea, Nausea And Vomiting and Rash  . Neosporin [Neomycin-Bacitracin Zn-Polymyx] Rash  . Oxycodone-Acetaminophen Other (See Comments)   Prior to Admission medications   Medication Sig Start Date End Date Taking? Authorizing Provider  lidocaine (LIDODERM) 5 % Place 1 patch onto the skin daily. Remove & Discard patch within 12 hours or as directed by MD 02/08/14  Yes [name], MD  mirtazapine (REMERON) 30 MG tablet TAKE ONE TABLET AT BEDTIME. 07/24/15  Yes [name], MD  clonazePAM (KLONOPIN) 0.5 MG tablet Take 1 tablet (0.5 mg total) by mouth daily as needed for anxiety. May take 1/2 tab. Patient not taking: Reported on 12/24/2015 07/24/15   [name], MD  fluticasone [facility]) 50 MCG/ACT nasal spray Place 2 sprays into both nostrils at bedtime. For 2-3 weeks Patient not taking: Reported on 12/24/2015 10/06/15   Shawnee Knapp, MD  Naproxen Sodium (ALEVE PO) Take by mouth. Reported on 07/03/2015    Historical Provider, MD  omeprazole (PRILOSEC) 20 MG capsule Take 1 capsule (20 mg total) by mouth daily. 30 minutes prior  to dinner Patient not taking: Reported on 12/24/2015 10/06/15   Shawnee Knapp, MD  predniSONE (DELTASONE) 10 MG tablet Take 6tabs x1day, then 5tabs x1day, then 4tabs x1day, then 3tabs x1day, then 2tabs x1day, then 1tab x1day, then STOP Patient not taking: Reported on 12/24/2015 11/11/15   Pieter Partridge, DO  promethazine-dextromethorphan (PROMETHAZINE-DM) 6.25-15 MG/5ML syrup Take 5 mLs by mouth at bedtime as needed and may repeat dose one time if needed for cough. Patient not taking: Reported on 12/24/2015 10/06/15   Shawnee Knapp, MD     ROS: The patient denies fevers, chills, night sweats, unintentional weight  loss, chest pain, palpitations, wheezing, dyspnea on exertion, nausea, vomiting, abdominal pain, dysuria, hematuria, melena, numbness, weakness, or tingling.   All other systems have been reviewed and were otherwise negative with the exception of those mentioned in the HPI and as above.    PHYSICAL EXAM: Vitals:   12/24/15 1008  BP: 122/72  Pulse: 97  Resp: 17  Temp: 98.1 F (36.7 C)   Body mass index is 20.9 kg/m.   General: Alert, no acute distress HEENT:  Normocephalic, atraumatic, oropharynx patent. [facility] Cardiovascular:  Regular rate and rhythm, no rubs murmurs or gallops.  No Carotid bruits, radial pulse intact. No pedal edema.  Respiratory: Clear to auscultation bilaterally.  No wheezes, rales, or rhonchi.  No cyanosis, no use of accessory musculature Abdominal: No organomegaly, abdomen is soft and non-tender, positive bowel sounds.  No masses. Musculoskeletal: Gait intact. No edema, tenderness Skin: Genital exam: Very minimal tenderendss of the left epididymus without mass. There is a very minimal redness with hyperpigmentation in both inguinal areas. Woods lamp exam was normal. Neurologic: Facial musculature symmetric. Psychiatric: Patient acts appropriately throughout our interaction. Lymphatic: No cervical or submandibular lymphadenopathy   LABS: Results for orders placed or performed in visit on 12/24/15  POCT Skin KOH  Result Value Ref Range   Skin KOH, POC Negative     EKG/XRAY:   Primary read interpreted by Dr. Everlene Farrier at [facility].   ASSESSMENT/PLAN: KOH was negative. Advised him to use a drying powder such as Tinactin or Gold Bond. Referral made for ultrasound of the testicles.I personally performed the services described in this documentation, which was scribed in my presence. The recorded information has been reviewed and is accurate.I told him if the ultrasound was normal and rash was persistent I would send him to dermatology.   Gross  sideeffects, risk and benefits, and alternatives of medications d/w patient. Patient is aware that all medications have potential sideeffects and we are unable to predict every sideeffect or drug-drug interaction that may occur.  [name] 12/24/2015 11:12 AM

## 2015-12-24 NOTE — Patient Instructions (Signed)
     IF you received an x-ray today, you will receive an invoice from [facility]. Please contact [facility] at [phone] with questions or concerns regarding your invoice.   IF you received labwork today, you will receive an invoice from [facility]. Please contact [facility] at [phone] with questions or concerns regarding your invoice.   Our billing staff will not be able to assist you with questions regarding bills from these companies.  You will be contacted with the lab results as soon as they are available. The fastest way to get your results is to activate your [name] account. Instructions are located on the last page of this paperwork. If you have not heard from us regarding the results in 2 weeks, please contact this office.      

## 2015-12-26 ENCOUNTER — Telehealth: Payer: Self-pay

## 2015-12-26 ENCOUNTER — Other Ambulatory Visit: Payer: Self-pay | Admitting: *Deleted

## 2015-12-26 DIAGNOSIS — N50812 Left testicular pain: Secondary | ICD-10-CM

## 2015-12-26 NOTE — Telephone Encounter (Signed)
In order to schedule the ultrasound of the scrotum, an order for a doppler is also required.  Please add an order for the venous doppler.  [facility] #: [phone]

## 2015-12-27 NOTE — Telephone Encounter (Signed)
I am happy to order that I just need to the code for Doppler of the scrotum.

## 2015-12-27 NOTE — Telephone Encounter (Signed)
Routed to Dr Everlene Farrier

## 2015-12-29 ENCOUNTER — Ambulatory Visit
Admission: RE | Admit: 2015-12-29 | Discharge: 2015-12-29 | Disposition: A | Discharge status: Home / Self Care | Payer: PPO | Source: Ambulatory Visit | Attending: Emergency Medicine | Admitting: Emergency Medicine

## 2015-12-29 DIAGNOSIS — N50812 Left testicular pain: Secondary | ICD-10-CM

## 2015-12-29 DIAGNOSIS — N503 Cyst of epididymis: Secondary | ICD-10-CM | POA: Diagnosis not present

## 2015-12-29 NOTE — Telephone Encounter (Signed)
Spoke with [place] [facility], apparently issued has been resolved and pt is scheduled.

## 2015-12-30 ENCOUNTER — Ambulatory Visit: Payer: PPO

## 2015-12-31 ENCOUNTER — Ambulatory Visit (INDEPENDENT_AMBULATORY_CARE_PROVIDER_SITE_OTHER): Payer: PPO | Admitting: Emergency Medicine

## 2015-12-31 VITALS — BP 118/82 | HR 90 | Temp 98.5°F | Resp 16 | Ht 71.5 in | Wt 152.0 lb

## 2015-12-31 DIAGNOSIS — N50812 Left testicular pain: Secondary | ICD-10-CM

## 2015-12-31 DIAGNOSIS — R21 Rash and other nonspecific skin eruption: Secondary | ICD-10-CM

## 2015-12-31 NOTE — Progress Notes (Signed)
By signing my name below, I, [name], attest that this documentation has been prepared under the direction and in the presence of Arlyss Queen, MD. Electronically Signed: [name], [place]. 12/31/2015 , 10:55 AM .  Patient was seen in room 3 .  Chief Complaint:  Chief Complaint  Patient presents with  . Follow-up    radiology result    HPI: William [name] is a 55 y.o. male who reports to [facility] today for follow up on ultrasound review. His ultrasound showed normal appearing testes without mass or torsion, small collections of hydrocele, small left epididymal cyst and question of left inguinal hernia. He still notes having some irritation and soreness in his groin.   He also wants to start remeron but will follow up with Dr. Brigitte Pulse.   Past Medical History:  Diagnosis Date  . Allergy   . Anxiety   . Depression   . Gastroparesis   . Heart murmur   . Migraine headache   . Mitral valve prolapse    No past surgical history on file. Social History   Social History  . Marital status: Married    Spouse name: N/A  . Number of children: N/A  . Years of education: N/A   Social History Main Topics  . Smoking status: Never Smoker  . Smokeless tobacco: Never Used  . Alcohol use No  . Drug use: No  . Sexual activity: Yes    Partners: Female   Other Topics Concern  . None   Social History Narrative  . None   Family History  Problem Relation Age of Onset  . Hypertension Mother   . Hypertension Brother    Allergies  Allergen Reactions  . Erythromycin Nausea And Vomiting and Anaphylaxis  . Hydrocodone-Acetaminophen Nausea And Vomiting  . Tramadol Nausea And Vomiting  . Cephalexin Diarrhea and Nausea And Vomiting  . Cephalosporins Diarrhea and Nausea And Vomiting  . Compazine [Prochlorperazine Edisylate]     uncontrollable shake - tremor  . Ketoconazole Itching  . Ketorolac Nausea And Vomiting  . Ketorolac Tromethamine     REACTION: Reaction not known    . Latex Itching    REACTION: Reaction not known  . Nalbuphine Other (See Comments)    States loss of consciousness REACTION: Reaction not known  . Oxycodone-Acetaminophen Other (See Comments) and Swelling  . Prednisone Nausea And Vomiting  . Prochlorperazine Edisylate     REACTION: shakes/tremors  . Topamax [Topiramate] Diarrhea and Nausea And Vomiting  . Cefdinir Diarrhea, Nausea And Vomiting and Rash  . Neosporin [Neomycin-Bacitracin Zn-Polymyx] Rash  . Oxycodone-Acetaminophen Other (See Comments)   Prior to Admission medications   Medication Sig Start Date End Date Taking? Authorizing Provider  lidocaine (LIDODERM) 5 % Place 1 patch onto the skin daily. Remove & Discard patch within 12 hours or as directed by MD 02/08/14  Yes [name], MD  mirtazapine (REMERON) 30 MG tablet TAKE ONE TABLET AT BEDTIME. 07/24/15  Yes [name], MD  Naproxen Sodium (ALEVE PO) Take by mouth. Reported on 07/03/2015   Yes Historical Provider, MD     ROS:  Constitutional: negative for fever, chills, night sweats, weight changes, or fatigue  HEENT: negative for vision changes, hearing loss, congestion, rhinorrhea, ST, epistaxis, or sinus pressure Cardiovascular: negative for chest pain or palpitations Respiratory: negative for hemoptysis, wheezing, shortness of breath, or cough Abdominal: negative for abdominal pain, nausea, vomiting, diarrhea, or constipation Dermatological: negative for rash Neurologic: negative for headache, dizziness, or syncope  GU: positive for groin pain All other systems reviewed and are otherwise negative with the exception to those above and in the HPI.  PHYSICAL EXAM: Vitals:   12/31/15 0956  BP: 118/82  Pulse: 90  Resp: 16  Temp: 98.5 F (36.9 C)   Body mass index is 20.9 kg/m.   General: Alert, no acute distress HEENT:  Normocephalic, atraumatic, oropharynx patent. Eye: Juliette Mangle [facility] Cardiovascular:  Regular rate and rhythm, no rubs murmurs or  gallops.  No Carotid bruits, radial pulse intact. No pedal edema.  Respiratory: Clear to auscultation bilaterally.  No wheezes, rales, or rhonchi.  No cyanosis, no use of accessory musculature Abdominal: No organomegaly, abdomen is soft and non-tender, positive bowel sounds. No masses. Musculoskeletal: Gait intact. No edema, tenderness Skin: No rashes. Neurologic: Facial musculature symmetric. Psychiatric: Patient acts appropriately throughout our interaction.  Lymphatic: No cervical or submandibular lymphadenopathy Genitourinary/Anorectal: No acute findings; no palpable inguinal hernia  LABS:   EKG/XRAY:   US Scrotum  Result Date: 12/29/2015 CLINICAL DATA:  LEFT testicular pain EXAM: SCROTAL ULTRASOUND DOPPLER ULTRASOUND OF THE TESTICLES TECHNIQUE: Complete ultrasound examination of the testicles, epididymis, and other scrotal structures was performed. Color and spectral Doppler ultrasound were also utilized to evaluate blood flow to the testicles. COMPARISON:  None FINDINGS: Right testicle Measurements: 4.0 x 2.0 x 2.5 cm. Normal echogenicity without mass or calcification. Internal blood flow present on color Doppler imaging. Left testicle Measurements: 4.0 x 2.2 x 2.5 cm. Normal echogenicity without mass or calcification. Internal blood flow present on color Doppler imaging. Right epididymis:  Normal in size and appearance. Left epididymis:  Small cysts in LEFT epididymis Hydrocele:  Small LEFT hydrocele.  Minimal RIGHT hydrocele. Varicocele:  None visualized. Pulsed Doppler interrogation of both testes demonstrates normal low resistance arterial and venous waveforms bilaterally. Question of LEFT inguinal hernia is raised. IMPRESSION: Normal appearing testes without mass or torsion. Small LEFT and minimal RIGHT hydrocele collections. Small LEFT epididymal cyst. Question LEFT inguinal hernia; recommend correlation with physical exam. Electronically Signed   By: Lavonia Dana M.D.   On: 12/29/2015  17:44   [place] Art/ven Flow Abd Pelv Doppler  Result Date: 12/29/2015 CLINICAL DATA:  LEFT testicular pain EXAM: SCROTAL ULTRASOUND DOPPLER ULTRASOUND OF THE TESTICLES TECHNIQUE: Complete ultrasound examination of the testicles, epididymis, and other scrotal structures was performed. Color and spectral Doppler ultrasound were also utilized to evaluate blood flow to the testicles. COMPARISON:  None FINDINGS: Right testicle Measurements: 4.0 x 2.0 x 2.5 cm. Normal echogenicity without mass or calcification. Internal blood flow present on color Doppler imaging. Left testicle Measurements: 4.0 x 2.2 x 2.5 cm. Normal echogenicity without mass or calcification. Internal blood flow present on color Doppler imaging. Right epididymis:  Normal in size and appearance. Left epididymis:  Small cysts in LEFT epididymis Hydrocele:  Small LEFT hydrocele.  Minimal RIGHT hydrocele. Varicocele:  None visualized. Pulsed Doppler interrogation of both testes demonstrates normal low resistance arterial and venous waveforms bilaterally. Question of LEFT inguinal hernia is raised. IMPRESSION: Normal appearing testes without mass or torsion. Small LEFT and minimal RIGHT hydrocele collections. Small LEFT epididymal cyst. Question LEFT inguinal hernia; recommend correlation with physical exam. Electronically Signed   By: Lavonia Dana M.D.   On: 12/29/2015 17:44     ASSESSMENT/PLAN: Patient will try [place] for his inguinal rash. If this does not alleviate his symptoms will make referral to dermatology. He is going to follow-up with Dr. Brigitte Pulse as his PCP.I personally performed the services described  in this documentation, which was scribed in my presence. The recorded information has been reviewed and is accurate.  Gross sideeffects, risk and benefits, and alternatives of medications d/w patient. Patient is aware that all medications have potential sideeffects and we are unable to predict every sideeffect or drug-drug interaction that may  occur.  [name] 12/31/2015 10:35 AM

## 2015-12-31 NOTE — Patient Instructions (Signed)
     IF you received an x-ray today, you will receive an invoice from [facility]. Please contact [facility] at [phone] with questions or concerns regarding your invoice.   IF you received labwork today, you will receive an invoice from [facility]. Please contact [facility] at [phone] with questions or concerns regarding your invoice.   Our billing staff will not be able to assist you with questions regarding bills from these companies.  You will be contacted with the lab results as soon as they are available. The fastest way to get your results is to activate your [name] account. Instructions are located on the last page of this paperwork. If you have not heard from us regarding the results in 2 weeks, please contact this office.      

## 2016-01-06 ENCOUNTER — Ambulatory Visit: Payer: Medicare Other | Admitting: Neurology

## 2016-01-07 ENCOUNTER — Other Ambulatory Visit: Payer: Self-pay | Admitting: Family Medicine

## 2016-01-07 DIAGNOSIS — R21 Rash and other nonspecific skin eruption: Secondary | ICD-10-CM

## 2016-01-07 MED ORDER — TRIAMCINOLONE ACETONIDE 0.1 % EX CREA: 1.0000 "application " | TOPICAL_CREAM | Freq: Two times a day (BID) | CUTANEOUS | 0 refills | Status: DC

## 2016-01-09 ENCOUNTER — Ambulatory Visit: Payer: PPO | Admitting: Family Medicine

## 2016-01-24 ENCOUNTER — Ambulatory Visit (INDEPENDENT_AMBULATORY_CARE_PROVIDER_SITE_OTHER): Payer: PPO | Admitting: Emergency Medicine

## 2016-01-24 ENCOUNTER — Encounter: Payer: Self-pay | Admitting: Emergency Medicine

## 2016-01-24 VITALS — BP 122/72 | HR 96 | Temp 97.9°F | Resp 17 | Ht 71.5 in | Wt 154.0 lb

## 2016-01-24 DIAGNOSIS — N50812 Left testicular pain: Secondary | ICD-10-CM | POA: Diagnosis not present

## 2016-01-24 DIAGNOSIS — L291 Pruritus scroti: Secondary | ICD-10-CM | POA: Diagnosis not present

## 2016-01-24 NOTE — Patient Instructions (Addendum)
Please take Zyrtec 10 mg 1 a day. I had made you a referral to an allergist. Make an appointment with [facility] to see your mother.    IF you received an x-ray today, you will receive an invoice from [facility]. Please contact [facility] at [phone] with questions or concerns regarding your invoice.   IF you received labwork today, you will receive an invoice from [employer]. Please contact [facility] at [phone] with questions or concerns regarding your invoice.   Our billing staff will not be able to assist you with questions regarding bills from these companies.  You will be contacted with the lab results as soon as they are available. The fastest way to get your results is to activate your [name] account. Instructions are located on the last page of this paperwork. If you have not heard from [place] regarding the results in 2 weeks, please contact this office.

## 2016-01-24 NOTE — Progress Notes (Signed)
Subjective:  This chart was scribed for William [name], by Tamsen Roers, at [facility].  This patient was seen in room 4  and the patient's care was started at 8:33 AM.   Chief Complaint  Patient presents with  . Follow-up    rash on groin     Patient ID: William [name], male    DOB: 1960/05/29, 55 y.o.   MRN: [id number]  HPI HPI Comments: William [name] is a 55 y.o. male who presents to the [facility] complaining of intermittent itching in his groin/scrotum area for the past year.  He denies any rash to the area and has been seen here at [facility] for this complaint in the past.  He has been using a cream given to him by Dr. Joseph Art about a week ago but denies any full relief. Patient uses Remeron nightly and Ibuprofen when he has a headache.   Patient does not shave his groin area.  He has been using 100% cotton underwear and uses dermatology approved detergent. He has not used any zyrtec or over the counter anti histamines but is willing to try them to see if it will alleviate his itching.    Patient Active Problem List   Diagnosis Date Noted  . GERD (gastroesophageal reflux disease) 10/13/2015  . Gastroparesis 10/13/2015  . Intractable chronic migraine without aura and without status migrainosus 12/15/2013  . Hyperlipidemia 04/04/2012  . MITRAL VALVE PROLAPSE 07/15/2009  . IRRITABLE BOWEL SYNDROME 07/15/2009   Past Medical History:  Diagnosis Date  . Allergy   . Anxiety   . Depression   . Gastroparesis   . Heart murmur   . Migraine headache   . Mitral valve prolapse    No past surgical history on file. Allergies  Allergen Reactions  . Erythromycin Nausea And Vomiting and Anaphylaxis  . Hydrocodone-Acetaminophen Nausea And Vomiting  . Tramadol Nausea And Vomiting  . Cephalexin Diarrhea and Nausea And Vomiting  . Cephalosporins Diarrhea and Nausea And Vomiting  . Compazine [Prochlorperazine Edisylate]    uncontrollable shake - tremor  . Ketoconazole Itching  . Ketorolac Nausea And Vomiting  . Ketorolac Tromethamine     REACTION: Reaction not known  . Latex Itching    REACTION: Reaction not known  . Nalbuphine Other (See Comments)    States loss of consciousness REACTION: Reaction not known  . Oxycodone-Acetaminophen Other (See Comments) and Swelling  . Prednisone Nausea And Vomiting  . Prochlorperazine Edisylate     REACTION: shakes/tremors  . Topamax [Topiramate] Diarrhea and Nausea And Vomiting  . Cefdinir Diarrhea, Nausea And Vomiting and Rash  . Neosporin [Neomycin-Bacitracin Zn-Polymyx] Rash  . Oxycodone-Acetaminophen Other (See Comments)   Prior to Admission medications   Medication Sig Start Date End Date Taking? Authorizing Provider  lidocaine (LIDODERM) 5 % Place 1 patch onto the skin daily. Remove & Discard patch within 12 hours or as directed by [name] 02/08/14  Yes [name], [name]  mirtazapine (REMERON) 30 MG tablet TAKE ONE TABLET AT BEDTIME. 07/24/15  Yes [name], [name]  Naproxen Sodium (ALEVE PO) Take by mouth. Reported on 07/03/2015   Yes Historical Provider, [name]  triamcinolone cream (KENALOG) 0.1 % Apply 1 application topically 2 (two) times daily. Patient not taking: Reported on 01/24/2016 01/07/16   [name], [name]   Social History   Social History  . Marital status: Married    Spouse name: N/A  . Number of children: N/A  .  Years of education: N/A   Occupational History  . Not on file.   Social History Main Topics  . Smoking status: Never Smoker  . Smokeless tobacco: Never Used  . Alcohol use No  . Drug use: No  . Sexual activity: Yes    Partners: Female   Other Topics Concern  . Not on file   Social History Narrative  . No narrative on file    Review of Systems  Constitutional: Negative for chills and fever.  Eyes: Negative for pain, redness and itching.  Respiratory: Negative for cough and shortness of breath.   Gastrointestinal:  Negative for nausea and vomiting.  Skin: Negative for rash.  Neurological: Negative for syncope and speech difficulty.       Objective:   Physical Exam Vitals:   01/24/16 0833  BP: 122/72  Pulse: 96  Resp: 17  Temp: 97.9 F (36.6 C)  TempSrc: Oral  SpO2: 96%  Weight: 154 lb (69.9 kg)  Height: 5' 11.5" (1.816 m)    CONSTITUTIONAL: Well developed/well nourished HEAD: Normocephalic/atraumatic EYES: EOMI/PERRL ENMT: Mucous membranes moist NECK: supple no meningeal signs SPINE/BACK:entire spine nontender CV: S1/S2 noted, no murmurs/rubs/gallops noted SKIN: warm, color normal PSYCH: no abnormalities of mood noted, alert and oriented to situation     Assessment & Plan:  Patient has a normal exam. His main complaint is itching. He will take Zyrtec at night. Referral made to an allergist for evaluation. He will wear white cotton underwear and stop using dryer sheets.I personally performed the services described in this documentation, which was scribed in my presence. The recorded information has been reviewed and is accurate.  Darlyne Russian, [name]

## 2016-02-16 ENCOUNTER — Ambulatory Visit: Payer: PPO | Admitting: Neurology

## 2016-03-09 ENCOUNTER — Ambulatory Visit (INDEPENDENT_AMBULATORY_CARE_PROVIDER_SITE_OTHER): Payer: PPO

## 2016-03-10 ENCOUNTER — Ambulatory Visit (INDEPENDENT_AMBULATORY_CARE_PROVIDER_SITE_OTHER): Payer: PPO | Admitting: Family Medicine

## 2016-03-10 VITALS — BP 130/80 | HR 99 | Temp 98.1°F | Resp 17 | Ht 71.5 in | Wt 155.0 lb

## 2016-03-10 DIAGNOSIS — Q809 Congenital ichthyosis, unspecified: Secondary | ICD-10-CM

## 2016-03-10 DIAGNOSIS — R51 Headache: Secondary | ICD-10-CM | POA: Diagnosis not present

## 2016-03-10 DIAGNOSIS — G8929 Other chronic pain: Secondary | ICD-10-CM

## 2016-03-10 NOTE — Patient Instructions (Addendum)
Look at [email or web address] They have some suggestions - eating cold items, eliminating gluten from her diet, or even eating spicy chili peppers - sounds like gluten free diet has helped some people overcome chronic diet. Lets come back and talk about oxygen.    IF you received an x-ray today, you will receive an invoice from [facility]. Please contact [facility] at [phone] with questions or concerns regarding your invoice.   IF you received labwork today, you will receive an invoice from [employer]. Please contact [facility] at [phone] with questions or concerns regarding your invoice.   Our billing staff will not be able to assist you with questions regarding bills from these companies.  You will be contacted with the lab results as soon as they are available. The fastest way to get your results is to activate your [name] account. Instructions are located on the last page of this paperwork. If you have not heard from [place] regarding the results in 2 weeks, please contact this office.

## 2016-03-10 NOTE — Progress Notes (Addendum)
Subjective:  By signing my name below, I, William [name], attest that this documentation has been prepared under the direction and in the presence of [name], MD.  Electronically Signed: Thea Alken, ED Scribe. 03/10/2016. 8:36 AM.   Patient ID: William [name], male    DOB: [birth year], 55 y.o.   MRN: [id number]  HPI Chief Complaint  Patient presents with  . Medication Problem    Migraine medication    HPI Comments: William [name] is a 55 y.o. male who presents to the [facility] to discuss medications. Pt is disabled due to migraines. He is intolerant to numerous medications and many trigger his migraines. He seen neurology, Dr. Shona Simpson, for the daily intractable migraines. He had been taking daily clonazepam which he weened off of several months ago, but trigger recurrent migraines. He failed biofeedback in addition to meds and has been offered botox. He cannot tolerate Toradol injection and was prescribed prednisone taper instead. Neurology only gave him the option of  botox injections. He had MRI brain 4 months prior that was normal.   Pt is currently on Remeron. He reports initial improvement when he first started medication but no longer finds it effective.  He continues to have migraines that he suspects is due to stress. He reports family stress of taking care of his mother. He's had trouble sleeping due to migraines.  Reports since weaning off clonazepam, migraines are not as intense and do not last as long. States medication made him sluggish and foggy and was unable to think clear. He takes ibuprofen as needed. Since June he has taken about 4 ibuprofen. Pt has tried home oxygen in the past; felt this treatment worked well only for a short period of time, for about 3-4 months. Pt has been offered botox several times by multiple physicians, but after doing research, he is not comfortable with this treatment. He has concluded that he wants to wean off Remeron and  try diet and exercise.   Pt also reports dry skin on hands, legs and feet. He has tried OTC cream but has only tried it a couple times.   Patient Active Problem List   Diagnosis Date Noted  . GERD (gastroesophageal reflux disease) 10/13/2015  . Gastroparesis 10/13/2015  . Intractable chronic migraine without aura and without status migrainosus 12/15/2013  . Hyperlipidemia 04/04/2012  . MITRAL VALVE PROLAPSE 07/15/2009  . IRRITABLE BOWEL SYNDROME 07/15/2009   Past Medical History:  Diagnosis Date  . Allergy   . Anxiety   . Depression   . Gastroparesis   . Heart murmur   . Migraine headache   . Mitral valve prolapse    No past surgical history on file. Allergies  Allergen Reactions  . Erythromycin Nausea And Vomiting and Anaphylaxis  . Hydrocodone-Acetaminophen Nausea And Vomiting  . Tramadol Nausea And Vomiting  . Cephalexin Diarrhea and Nausea And Vomiting  . Cephalosporins Diarrhea and Nausea And Vomiting  . Compazine [Prochlorperazine Edisylate]     uncontrollable shake - tremor  . Ketoconazole Itching  . Ketorolac Nausea And Vomiting  . Ketorolac Tromethamine     REACTION: Reaction not known  . Latex Itching    REACTION: Reaction not known  . Nalbuphine Other (See Comments)    States loss of consciousness REACTION: Reaction not known  . Oxycodone-Acetaminophen Other (See Comments) and Swelling  . Prednisone Nausea And Vomiting  . Prochlorperazine Edisylate     REACTION: shakes/tremors  . Topamax [Topiramate] Diarrhea and  Nausea And Vomiting  . Cefdinir Diarrhea, Nausea And Vomiting and Rash  . Neosporin [Neomycin-Bacitracin Zn-Polymyx] Rash  . Oxycodone-Acetaminophen Other (See Comments)   Prior to Admission medications   Medication Sig Start Date End Date Taking? Authorizing Provider  lidocaine (LIDODERM) 5 % Place 1 patch onto the skin daily. Remove & Discard patch within 12 hours or as directed by MD 02/08/14   [name], MD  mirtazapine (REMERON) 30  MG tablet TAKE ONE TABLET AT BEDTIME. 07/24/15   [name], MD  Naproxen Sodium (ALEVE PO) Take by mouth. Reported on 07/03/2015    Historical Provider, MD  triamcinolone cream (KENALOG) 0.1 % Apply 1 application topically 2 (two) times daily. Patient not taking: Reported on 01/24/2016 01/07/16   [name], MD   Social History   Social History  . Marital status: Married    Spouse name: N/A  . Number of children: N/A  . Years of education: N/A   Occupational History  . Not on file.   Social History Main Topics  . Smoking status: Never Smoker  . Smokeless tobacco: Never Used  . Alcohol use No  . Drug use: No  . Sexual activity: Yes    Partners: Female   Other Topics Concern  . Not on file   Social History Narrative  . No narrative on file   Review of Systems  Constitutional: Negative for activity change, appetite change, chills, fever and unexpected weight change.  Skin: Positive for rash.  Allergic/Immunologic: Positive for environmental allergies. Negative for food allergies and immunocompromised state.  Neurological: Positive for headaches. Negative for tremors, syncope and speech difficulty.  Psychiatric/Behavioral: Positive for sleep disturbance.    Objective:   Physical Exam  Constitutional: He is oriented to person, place, and time. Vital signs are normal. He appears well-developed and well-nourished. No distress.  HENT:  Head: Normocephalic and atraumatic.  Eyes: Conjunctivae and EOM are normal.  Neck: Neck supple.  Cardiovascular: Normal rate.   Pulmonary/Chest: Effort normal.  Musculoskeletal: Normal range of motion.  Neurological: He is alert and oriented to person, place, and time.  Skin: Skin is warm and dry.  Psychiatric: He has a normal mood and affect. His behavior is normal.  Nursing note and vitals reviewed.   Vitals:   03/10/16 0827  BP: 130/80  Pulse: 99  Resp: 17  Temp: 98.1 F (36.7 C)  TempSrc: Oral  SpO2: 98%  Weight: 155 lb  (70.3 kg)  Height: 5' 11.5" (1.816 m)   Assessment & Plan:   1. Chronic intractable headache, unspecified headache type   2. Ichthyosis - reviewed cool showers, thick hypoallergenic moisturizer, could try shea butter   William [name] is wanting to come off as much medication as he can which I think is a great idea. He has a numerous amount of medication sensitivities and so I think there is a good chance that he maybe having some migraine triggers with meds, the fillers, or other chemical exposures. He wants to stop the remeron - not helping much - so decrease from 30 to 15 qd x 2 wks then stop. Try very clean diet - avoiding processed foods/chemicals - esp preservatives, color additives, etc. May want to try gluten free diet or another elimination-type diet. Consider retrying oxygen therapy for acute HAs. UTD notes below:  "Oxygen (100 percent) is administered via a nonrebreathing facial mask with a flow rate of at least 12 L/min with the patient in a sitting, upright position. The inhalation should continue for  15 minutes to prevent the attack from returning, although the pain may subside as soon as five minutes after starting oxygen. Higher oxygen flow rates, up to 15 L/min, may sometimes be effective when standard rates are not. From a practical standpoint, it is reasonable to increase the flow rate if a lower rate is ineffective, as higher flow rate does not increase the risk of side effects. The use of a demand valve mask may enhance the efficacy of oxygen.  Oxygen is generally safe and without side effects. "  Over 40 min spent in face-to-face evaluation of and consultation with patient and coordination of care.  Over 50% of this time was spent counseling this patient.  I personally performed the services described in this documentation, which was scribed in my presence. The recorded information has been reviewed and considered, and addended by me as needed.   [name], M.D.  [facility] [address] [place], [place] [address] [phone] phone [phone] fax  03/10/16 1:09 PM

## 2016-04-01 DIAGNOSIS — M6289 Other specified disorders of muscle: Secondary | ICD-10-CM | POA: Insufficient documentation

## 2016-04-12 ENCOUNTER — Ambulatory Visit (INDEPENDENT_AMBULATORY_CARE_PROVIDER_SITE_OTHER): Payer: PPO | Admitting: Family Medicine

## 2016-04-12 ENCOUNTER — Encounter: Payer: Self-pay | Admitting: Family Medicine

## 2016-04-12 VITALS — BP 134/76 | HR 81 | Temp 98.0°F | Resp 16 | Ht 71.5 in | Wt 155.2 lb

## 2016-04-12 DIAGNOSIS — G8929 Other chronic pain: Secondary | ICD-10-CM

## 2016-04-12 DIAGNOSIS — K589 Irritable bowel syndrome without diarrhea: Secondary | ICD-10-CM

## 2016-04-12 DIAGNOSIS — R059 Cough, unspecified: Secondary | ICD-10-CM

## 2016-04-12 DIAGNOSIS — G43719 Chronic migraine without aura, intractable, without status migrainosus: Secondary | ICD-10-CM | POA: Diagnosis not present

## 2016-04-12 DIAGNOSIS — R6889 Other general symptoms and signs: Secondary | ICD-10-CM

## 2016-04-12 DIAGNOSIS — R519 Headache, unspecified: Secondary | ICD-10-CM

## 2016-04-12 DIAGNOSIS — E785 Hyperlipidemia, unspecified: Secondary | ICD-10-CM | POA: Diagnosis not present

## 2016-04-12 DIAGNOSIS — N5082 Scrotal pain: Secondary | ICD-10-CM | POA: Diagnosis not present

## 2016-04-12 DIAGNOSIS — R209 Unspecified disturbances of skin sensation: Secondary | ICD-10-CM

## 2016-04-12 DIAGNOSIS — R05 Cough: Secondary | ICD-10-CM

## 2016-04-12 DIAGNOSIS — R51 Headache: Secondary | ICD-10-CM | POA: Diagnosis not present

## 2016-04-12 DIAGNOSIS — E559 Vitamin D deficiency, unspecified: Secondary | ICD-10-CM

## 2016-04-12 NOTE — Patient Instructions (Addendum)
IF you received an x-ray today, you will receive an invoice from [facility]. Please contact [facility] at [phone] with questions or concerns regarding your invoice.   IF you received labwork today, you will receive an invoice from [employer]. Please contact [facility] at [phone] with questions or concerns regarding your invoice.   Our billing staff will not be able to assist you with questions regarding bills from these companies.  You will be contacted with the lab results as soon as they are available. The fastest way to get your results is to activate your [name] account. Instructions are located on the last page of this paperwork. If you have not heard from [place] regarding the results in 2 weeks, please contact this office.      Atopic Dermatitis Atopic dermatitis is a skin disorder that causes inflammation of the skin. This is the most common type of eczema. Eczema is a group of skin conditions that cause the skin to be itchy, red, and swollen. This condition is generally worse during the cooler winter months and often improves during the warm summer months. Symptoms can vary from person to person. Atopic dermatitis usually starts showing signs in infancy and can last through adulthood. This condition cannot be passed from one person to another (non-contagious), but is more common in families. Atopic dermatitis may not always be present. When it is present, it is called a flare-up. What are the causes? The exact cause of this condition is not known. Flare-ups of the condition may be triggered by:  Contact with something you are sensitive or allergic to.  Stress.  Certain foods.  Extremely hot or cold weather.  Harsh chemicals and soaps.  Dry air.  Chlorine. What increases the risk? This condition is more likely to develop in people who have a personal history or family history of eczema, allergies, asthma, or hay  fever. What are the signs or symptoms? Symptoms of this condition include:  Dry, scaly skin.  Red, itchy rash.  Itchiness, which can be severe. This may occur before the skin rash. This can make sleeping difficult.  Skin thickening and cracking can occur over time. How is this diagnosed? This condition is diagnosed based on your symptoms, a medical history, and a physical exam. How is this treated? There is no cure for this condition, but symptoms can usually be controlled. Treatment focuses on:  Controlling the itching and scratching. You may be given medicines, such as antihistamines or steroid creams.  Limiting exposure to things that you are sensitive or allergic to (allergens).  Recognizing situations that cause stress and developing a plan to manage stress. If your atopic dermatitis does not get better with medicines or is all over your body (widespread) , a treatment using a specific type of light (phototherapy) may be used. Follow these instructions at home: Skin care  Keep your skin well-moisturized. This seals in moisture and help prevent dryness.  Use unscented lotions that have petroleum in them.  Avoid lotions that contain alcohol and water. They can dry the skin.  Keep baths or showers short (less than 5 minutes) in warm water. Do not use hot water.  Use mild, unscented cleansers for bathing. Avoid soap and bubble bath.  Apply a moisturizer to your skin right after a bath or shower.   Do not apply anything to your skin without checking with your health care provider. General instructions  Dress in clothes made of cotton or cotton  blends. Dress lightly because heat increases itching.  When washing your clothes, rinse your clothes twice so all of the soap is removed.  Avoid any triggers that can cause a flare-up.  Try to manage your stress.  Keep your fingernails cut short.  Avoid scratching. Scratching makes the rash and itching worse. It may also result  in a skin infection (impetigo) due to a break in the skin caused by scratching.  Take or apply over-the-counter and prescription medicines only as told by your health care provider.  Keep all follow-up visits as told by your health care provider. This is important.  Do not be around people who have cold sores or fever blisters. If you get the infection, it may cause your atopic dermatitis to worsen. Contact a health care provider if:  Your itching interferes with sleep.  Your rash gets worse or is not better within one week of starting treatment.  You have a fever.  You have a rash flare-up after having contact with someone who has cold sores or fever blisters. Get help right away if:  You develop pus or soft yellow scabs in the rash area. Summary  This condition causes a red rash and itchy, dry, scaly skin.  Treatment focuses on controlling the itching and scratching, limiting exposure to things that you are sensitive or allergic to (allergens), and recognizing situations that cause stress and developing a plan to manage stress.  Keep your skin well-moisturized.  Keep baths or showers less than 5 minutes. This information is not intended to replace advice given to you by your health care provider. Make sure you discuss any questions you have with your health care provider. Document Released: 04/13/2000 Document Revised: 09/22/2015 Document Reviewed: 11/17/2012 Elsevier Interactive Patient Education  2017 [employer].

## 2016-04-12 NOTE — Progress Notes (Signed)
Subjective:    Patient ID: William [name], male    DOB: 13-Mar-1961, 55 y.o.   MRN: [id number] Chief Complaint  Patient presents with  . Follow-up  . Headache    had couple since last visit  . Cough    has had pneumonia 2x this year; dry cough during day; water sometimes helps  . Diabetes check    pt wants to have blood work to check and see if he has diabetes; states his hands/feet stay cold/dry    HPI William [name] is a delightful 55 year old male here for a one-month follow-up on his chronic medical conditions. He was previously followed by my now retired Social worker Dr. Joseph Art.  HA: Disabled due to migraines and intolerant to numerous medications many of which trigger his migraines. Followed by [facility] neurology Dr. Tomi Likens.  Failed biofeedback and reluctant to try Botox. Worsened with increased stress which he has been under as he is trying to take care of his mother. The migraine headaches cause difficulties sleeping. Currently treated with ibuprofen when necessary but he uses exceedingly rarely - only 1-2 times per month. He did use home oxygen prior which initially worked for him quite well for several months and so he was interested in retrying this. He also has been wanting to modify his diet and exercise and hopes to see improvement in his symptoms with that. We discussed trial of a very clean elimination diet due to his numerous sensitivities -  avoiding as many processed foods, chemicals, preservatives, color additives, etc. as possible. Consider trial of gluten-free diet.  He hasn't checked out the [facility] yet and he is working on his diet - but he has to learn how to cook. He doesn't cook at all - has a horrible diet and so doesn't know what to do to even get started.  Thinks he needs to buy a cookbook but it is to  Intimidating/overwhelming.    Mood d/o: Had to wean off clonazepam this fall as seem to be triggering migraines.  Remeron 30 mg was no longer effective  for him so he weaned off of it 2 wks prior.  Has increased focus, clarity, concentration off the remoeron though increased anxiety and worse sleep.  Cough: Got pneumonia twice this yr and when he tried to go to the gym  He would get winded easier And so feels like he can't do that anymroe.  Extremities:   Normal testosterone 1 year prior. Request to be check for diabetes. Hemoglobin A1c one year prior 5.2  Vitamin D deficiency: Vitamin D was 11 in January.  Mildly elevated cholesterol with LDL 136 1 year prior. Non-HDL 161  He likes to walk and lift very light weights for exercise but has been worried that he is not in good enough health to resume this safely.  Groin pruritis:  For over 2 years patient has been complaining of  Severe genital pruritus For which he's had visits with multiple providers. He has never had any rash or overlying skin changes. He's been treated multiple times for "jock itch" without success. His symptoms are certainly atypical and difficult for him to describe -  Seems to be more of a burning pain on his scrotum.   For months prior he had a scrotal ultrasound and Doppler in workup for this which showed a small left and minimal right hydrocele as well as a small left epididymal cyst and a possible left inguinal hernia. So I reassured patient that these are  quite unlikely to be clinically significant and that he should address it further with his urologist Dr. Diona Fanti at pt's upcoming appt.  Past Medical History:  Diagnosis Date  . Allergy   . Anxiety   . Depression   . Gastroparesis   . Heart murmur   . Migraine headache   . Mitral valve prolapse    History reviewed. No pertinent surgical history. Current Outpatient Prescriptions on File Prior to Visit  Medication Sig Dispense Refill  . lidocaine (LIDODERM) 5 % Place 1 patch onto the skin daily. Remove & Discard patch within 12 hours or as directed by MD 30 patch 0   No current facility-administered  medications on file prior to visit.    Allergies  Allergen Reactions  . Erythromycin Nausea And Vomiting and Anaphylaxis  . Hydrocodone-Acetaminophen Nausea And Vomiting  . Tramadol Nausea And Vomiting  . Cephalexin Diarrhea and Nausea And Vomiting  . Cephalosporins Diarrhea and Nausea And Vomiting  . Compazine [Prochlorperazine Edisylate]     uncontrollable shake - tremor  . Ketoconazole Itching  . Ketorolac Nausea And Vomiting  . Ketorolac Tromethamine     REACTION: Reaction not known  . Latex Itching    REACTION: Reaction not known  . Nalbuphine Other (See Comments)    States loss of consciousness REACTION: Reaction not known  . Oxycodone-Acetaminophen Other (See Comments) and Swelling  . Prednisone Nausea And Vomiting  . Prochlorperazine Edisylate     REACTION: shakes/tremors  . Topamax [Topiramate] Diarrhea and Nausea And Vomiting  . Cefdinir Diarrhea, Nausea And Vomiting and Rash  . Neosporin [Neomycin-Bacitracin Zn-Polymyx] Rash  . Oxycodone-Acetaminophen Other (See Comments)   Family History  Problem Relation Age of Onset  . Hypertension Mother   . Hypertension Brother    Social History   Social History  . Marital status: Married    Spouse name: N/A  . Number of children: N/A  . Years of education: N/A   Social History Main Topics  . Smoking status: Never Smoker  . Smokeless tobacco: Never Used  . Alcohol use No  . Drug use: No  . Sexual activity: Yes    Partners: Female   Other Topics Concern  . None   Social History Narrative  . None     Depression screen [facility] 2/9 04/12/2016 03/10/2016 01/24/2016 12/24/2015 10/06/2015  Decreased Interest 0 0 0 0 0  Down, Depressed, Hopeless 0 0 0 0 0  PHQ - 2 Score 0 0 0 0 0    Review of Systems  Constitutional: Positive for fatigue. Negative for activity change, appetite change, chills, fever and unexpected weight change.  Respiratory: Positive for cough. Negative for shortness of breath.   Musculoskeletal:  Negative for gait problem and joint swelling.  Skin: Negative for color change, pallor and wound.  Neurological: Positive for headaches. Negative for tremors, syncope, weakness and numbness.  Psychiatric/Behavioral: Positive for sleep disturbance. Negative for agitation, decreased concentration and dysphoric mood. The patient is nervous/anxious. The patient is not hyperactive.    See hpi    Objective:   Physical Exam  Constitutional: He is oriented to person, place, and time. He appears well-developed and well-nourished. No distress.  HENT:  Head: Normocephalic and atraumatic.  Eyes: Conjunctivae are normal. Pupils are equal, round, and reactive to light. No scleral icterus.  Neck: Normal range of motion. Neck supple. No thyromegaly present.  Cardiovascular: Normal rate, regular rhythm, normal heart sounds and intact distal pulses.   Pulmonary/Chest: Effort normal and breath sounds  normal. No respiratory distress.  Musculoskeletal: He exhibits no edema.  Lymphadenopathy:    He has no cervical adenopathy.  Neurological: He is alert and oriented to person, place, and time.  Skin: Skin is warm and dry. He is not diaphoretic.  Psychiatric: Thought content normal. His affect is blunt. His speech is delayed. He is slowed. He exhibits normal recent memory.      BP 134/76   Pulse 81   Temp 98 F (36.7 C) (Oral)   Resp 16   Ht 5' 11.5" (1.816 m)   Wt 155 lb 3.2 oz (70.4 kg)   SpO2 98%   BMI 21.34 kg/m   Assessment & Plan:  UTD notes below:  "Oxygen (100 percent) is administered via a nonrebreathing facial mask with a flow rate of at least 12 L/min with the patient in a sitting, upright position. The inhalation should continue for 15 minutes to prevent the attack from returning, although the pain may subside as soon as five minutes after starting oxygen. Higher oxygen flow rates, up to 15 L/min, may sometimes be effective when standard rates are not. From a practical standpoint, it is  reasonable to increase the flow rate if a lower rate is ineffective, as higher flow rate does not increase the risk of side effects. The use of a demand valve mask may enhance the efficacy of oxygen.  Oxygen is generally safe and without side effects. "  Over 40 min spent in face-to-face evaluation of and consultation with patient and coordination of care.  Over 50% of this time was spent counseling this patient.  1. Chronic intractable headache, unspecified headache type -  Advised patient to call if he would like to proceed with trial of home oxygen to treat migraines since it did respond well in the past and is of so little risk.   2. Intractable chronic migraine without aura and without status migrainosus - follows with Dr. Tomi Likens who advised that there is not a whole lot else to offer pt other than trial of botox inj but pt is to concerned about side effects to consider. cannot tolerate toradol inj Cont exercise, hydration, sleep, stress reduction, and supplements such as magnesium, riboflavin and coenzyme Q10  3. Hyperlipidemia, unspecified hyperlipidemia type   4. Irritable bowel syndrome, unspecified type   5. Cold extremities -  Patient reassured that circulation appears to be fine and no color changes accompany. Symptoms resolve with warmth applied or activity so no further evaluation needed at this point unless symptoms worsen.  6. Cough   7. Vitamin D deficiency   8. Scrotal pain - rec he discuss with his urologist Dr. Diona Fanti as has been having scrotal skin burning/itching for years unresponsive to tinea cruris treatments.    will be due for CPE in May. Last one was done at [facility] using hypoallergenic think skin moisturizing cream daily - may benefit from some urea or lactic acid topically as well prn.   William [name] delightful 55 year old man who has a rather extensive med allergy list and knows he is very sensitive to meds.   He suffers from mult chronic medical conditions  that are subjective and influenced by his systemic status, psychological status, and lifestyle habits such as severe chronic migraines, depression, scrotal pruritus, and irritable bowel syndrome.  William [name] has been very dedicated to coming off of his medications as he felt it was causing just as many if not more side effects rather than symptom relief.  Over  the past several months he has successfully weaned himself off both Klonopin and Remeron and does seem to be feeling better. Again reviewed in detail with patient that I suspect that many of his somatic symptoms may be triggered by his admittedly poor diet as he is consuming a large amount of processed chemicals and preservatives - apparently pt does not know how to cook at all (we are talking having trouble boiling water here).  Prior his wife has always cooked for him but she has become very busy at work so he has resorted to exclusively eating pre-prepared convenience foods and fast food.   I think there is an excellent chance that patient might have a huge improvement in his symptoms if he was able to adopt a whole foods diet. I also think patient could benefit from an elimination diet like trial of gluten-free or FODMAPs but at this point he will start off trying to bake or steam a lean protein and vegetable.  Orders Placed This Encounter  Procedures  . CBC  . Hemoglobin A1c  . TSH  . Comprehensive metabolic panel  . VITAMIN D 25 Hydroxy (Vit-D Deficiency, Fractures)  . Ambulatory referral to Urology    Referral Priority:   Routine    Referral Type:   Consultation    Referral Reason:   Specialty Services Required    Referred to Provider:   Franchot Gallo, MD    Requested Specialty:   Urology    Number of Visits Requested:   1      [name], M.D.  [facility] [address] [place], [place] [address] [phone] phone [phone] fax  04/21/16 2:41 PM

## 2016-04-13 LAB — COMPREHENSIVE METABOLIC PANEL
A/G RATIO: 2 (ref 1.2–2.2)
ALT: 20 IU/L (ref 0–44)
AST: 19 IU/L (ref 0–40)
Albumin: 4.3 g/dL (ref 3.5–5.5)
Alkaline Phosphatase: 66 IU/L (ref 39–117)
BILIRUBIN TOTAL: 0.8 mg/dL (ref 0.0–1.2)
BUN / CREAT RATIO: 12 (ref 9–20)
BUN: 11 mg/dL (ref 6–24)
CHLORIDE: 102 mmol/L (ref 96–106)
CO2: 23 mmol/L (ref 18–29)
Calcium: 9.2 mg/dL (ref 8.7–10.2)
Creatinine, Ser: 0.92 mg/dL (ref 0.76–1.27)
GFR calc non Af Amer: 93 mL/min/{1.73_m2} (ref >59)
GFR, EST AFRICAN AMERICAN: 107 mL/min/{1.73_m2} (ref >59)
GLOBULIN, TOTAL: 2.2 g/dL (ref 1.5–4.5)
Glucose: 71 mg/dL (ref 65–99)
POTASSIUM: 4.3 mmol/L (ref 3.5–5.2)
SODIUM: 142 mmol/L (ref 134–144)
TOTAL PROTEIN: 6.5 g/dL (ref 6.0–8.5)

## 2016-04-13 LAB — HEMOGLOBIN A1C
Est. average glucose Bld gHb Est-mCnc: 105 mg/dL
Hgb A1c MFr Bld: 5.3 % (ref 4.8–5.6)

## 2016-04-13 LAB — CBC
Hematocrit: 41.2 % (ref 37.5–51.0)
Hemoglobin: 14.3 g/dL (ref 13.0–17.7)
MCH: 31 pg (ref 26.6–33.0)
MCHC: 34.7 g/dL (ref 31.5–35.7)
MCV: 89 fL (ref 79–97)
PLATELETS: 286 10*3/uL (ref 150–379)
RBC: 4.62 x10E6/uL (ref 4.14–5.80)
RDW: 13 % (ref 12.3–15.4)
WBC: 8 10*3/uL (ref 3.4–10.8)

## 2016-04-13 LAB — TSH: TSH: 0.682 u[IU]/mL (ref 0.450–4.500)

## 2016-04-13 LAB — VITAMIN D 25 HYDROXY (VIT D DEFICIENCY, FRACTURES): Vit D, 25-Hydroxy: 10.5 ng/mL — ABNORMAL LOW (ref 30.0–100.0)

## 2016-05-05 ENCOUNTER — Encounter: Payer: Self-pay | Admitting: Family Medicine

## 2016-05-05 ENCOUNTER — Ambulatory Visit (INDEPENDENT_AMBULATORY_CARE_PROVIDER_SITE_OTHER): Payer: PPO | Admitting: Family Medicine

## 2016-05-05 VITALS — BP 126/78 | HR 84 | Temp 97.6°F | Resp 16 | Ht 71.5 in | Wt 150.0 lb

## 2016-05-05 DIAGNOSIS — L853 Xerosis cutis: Secondary | ICD-10-CM | POA: Diagnosis not present

## 2016-05-05 DIAGNOSIS — R350 Frequency of micturition: Secondary | ICD-10-CM | POA: Diagnosis not present

## 2016-05-05 DIAGNOSIS — N5082 Scrotal pain: Secondary | ICD-10-CM | POA: Diagnosis not present

## 2016-05-05 DIAGNOSIS — R109 Unspecified abdominal pain: Secondary | ICD-10-CM

## 2016-05-05 DIAGNOSIS — E559 Vitamin D deficiency, unspecified: Secondary | ICD-10-CM | POA: Diagnosis not present

## 2016-05-05 DIAGNOSIS — R809 Proteinuria, unspecified: Secondary | ICD-10-CM | POA: Diagnosis not present

## 2016-05-05 LAB — POCT CBC
Granulocyte percent: 71.4 %G (ref 37–80)
HEMATOCRIT: 41.8 % — AB (ref 43.5–53.7)
HEMOGLOBIN: 14.7 g/dL (ref 14.1–18.1)
LYMPH, POC: 1.6 (ref 0.6–3.4)
MCH, POC: 32.8 pg — AB (ref 27–31.2)
MCHC: 35.2 g/dL (ref 31.8–35.4)
MCV: 92.9 fL (ref 80–97)
MID (cbc): 0.9 (ref 0–0.9)
MPV: 8.1 fL (ref 0–99.8)
POC GRANULOCYTE: 6.4 (ref 2–6.9)
POC LYMPH %: 18.5 % (ref 10–50)
POC MID %: 10.1 %M (ref 0–12)
Platelet Count, POC: 223 10*3/uL (ref 142–424)
RBC: 4.5 M/uL — AB (ref 4.69–6.13)
RDW, POC: 13 %
WBC: 8.9 10*3/uL (ref 4.6–10.2)

## 2016-05-05 LAB — POCT URINALYSIS DIP (MANUAL ENTRY)
BILIRUBIN UA: NEGATIVE
BILIRUBIN UA: NEGATIVE
Glucose, UA: NEGATIVE
LEUKOCYTES UA: NEGATIVE
NITRITE UA: NEGATIVE
Spec Grav, UA: 1.025
Urobilinogen, UA: 0.2
pH, UA: 6

## 2016-05-05 LAB — POC MICROSCOPIC URINALYSIS (UMFC)

## 2016-05-05 LAB — POCT SEDIMENTATION RATE: POCT SED RATE: 9 mm/hr (ref 0–22)

## 2016-05-05 MED ORDER — MELOXICAM 7.5 MG PO TABS: 7.5000 mg | ORAL_TABLET | Freq: Every day | ORAL | 0 refills | Status: DC

## 2016-05-05 MED ORDER — AMMONIUM LACTATE 12 % EX LOTN: 1.0000 "application " | TOPICAL_LOTION | Freq: Two times a day (BID) | CUTANEOUS | 0 refills | Status: DC

## 2016-05-05 MED ORDER — UREA 40 % EX OINT: TOPICAL_OINTMENT | CUTANEOUS | 0 refills | Status: DC | PRN

## 2016-05-05 MED ORDER — UREA 40 % EX CREA: 1.0000 "application " | TOPICAL_CREAM | Freq: Two times a day (BID) | CUTANEOUS | 3 refills | Status: DC

## 2016-05-05 MED ORDER — VITAMIN D (ERGOCALCIFEROL) 1.25 MG (50000 UNIT) PO CAPS: 50000.0000 [IU] | ORAL_CAPSULE | ORAL | 0 refills | Status: DC

## 2016-05-05 MED ORDER — CYCLOBENZAPRINE HCL 5 MG PO TABS: 5.0000 mg | ORAL_TABLET | Freq: Every day | ORAL | 1 refills | Status: DC

## 2016-05-05 NOTE — Patient Instructions (Addendum)
Keep your appointment on Thursday with me so that we can recheck your urine and make sure the protein has cleared. Increase your water to 6-8 bottles a day. Continue to get adequate protein in your diet but stop the protein shake supplements like you are getting adequate amounts in your diet   I have sent scripts for both ammonium lactate cream and urea cream to your pharmacy. You may use whichever of these are the most reasonable cost to you. Would also be fine to try both and see which works better for you. You could use 1 in the morning and 1 in the evening if you would like.  IF you received an x-ray today, you will receive an invoice from [facility]. Please contact [facility] at [phone] with questions or concerns regarding your invoice.   IF you received labwork today, you will receive an invoice from [place]. Please contact [name] at [phone] with questions or concerns regarding your invoice.   Our billing staff will not be able to assist you with questions regarding bills from these companies.  You will be contacted with the lab results as soon as they are available. The fastest way to get your results is to activate your [name] account. Instructions are located on the last page of this paperwork. If you have not heard from [place] regarding the results in 2 weeks, please contact this office.    Proteinuria Proteinuria is a condition in which urine contains more protein than is normal. Proteinuria is either a sign that your body is producing too much protein or a sign that there is a problem with the kidneys. Healthy kidneys prevent most substances that the body needs, including proteins, from leaving the bloodstream and ending up in urine. CAUSES  Proteinuria may be caused by a temporary event or condition such as stress, exercise, or fever, and go away on its own. Proteinuria may also be a symptom of a more serious condition or disease. Causes of proteinuria  include:  A kidney disease caused by:  Diabetes.  High blood pressure (hypertension).   A disease that affects the immune system, such as lupus.  A genetic disease, such as Alport's syndrome.  Medicines that damage the kidneys, such as long-term nonsteroidal anti-inflammatory drugs (NSAIDs).  Poisoning or exposure to toxic substances.  A reoccurring kidney or urinary infection.  Excess protein production in the body caused by:  Multiple myeloma.  Amyloidosis. SYMPTOMS You may have proteinuria without having noticeable symptoms. If there is a large amount of protein in your urine, your urine may look foamy. You may also notice swelling (edema) in your hands, feet, abdomen, or face. DIAGNOSIS To determine whether you have proteinuria, you will need to provide a urine sample. Your urine will then be tested for too much protein and the main blood protein albumin. If your test shows that you have proteinuria, you may need to take additional tests to determine its cause, how much protein is in your urine, and what type of protein is being lost. Tests may include:  Blood tests.  Urine tests.  A blood pressure measurement.  Imaging tests. TREATMENT  Treatment will depend on the cause of your proteinuria. Your caregiver will discuss treatment options with you after you have been diagnosed. If your proteinuria is mild or temporary, no treatment may be necessary. HOME CARE INSTRUCTIONS Ask your caregiver if monitoring the level of protein in your urine at home using simple testing strips is appropriate for you. Early  detection of proteinuria can lead to early and often successful treatment of the condition causing it. This information is not intended to replace advice given to you by your health care provider. Make sure you discuss any questions you have with your health care provider. Document Released: 06/06/2005 Document Revised: 01/09/2012 Document Reviewed: 03/07/2015 Elsevier  Interactive Patient Education  2017 [employer].

## 2016-05-05 NOTE — Progress Notes (Signed)
Subjective:  By signing my name below, I, William [name], attest that this documentation has been prepared under the direction and in the presence of [name], MD Electronically Signed: Ladene Artist, ED Scribe 05/05/2016 at 9:11 AM.   Patient ID: William [name], male    DOB: 1960/12/30, 56 y.o.   MRN: [id number]  Chief Complaint  Patient presents with  . Back Pain    x 2 weeks, urine collected   HPI  HPI Comments: William [name] is a 56 y.o. male who presents to the [facility] with a h/o severe chronic migraines for which he is disabled. Has been on numerous medications throughout his life for this and mood disorder, anxiety and insomnia. He recently decided he wanted to come off all his medications due to side effects and has been able to do so with relatively great results. We have been suspecting that he has some dietary sensitivities triggering many of his symptoms so I have been encouraging pt to try a elimination diet especially avoiding processed food with chemicals and preservatives.   HAs Today, pt states that his HAs have been at [place] since he was last seen for the first time in 20 years. He also states that both him and his wife have noticed improvement in the sense of clarity in his thoughts.   Back Pain Pt presents with gradual onset of left back pain over the left flank that radiates to the LLQ onset 2 weeks ago. He states that he was lifting cases of bottles waters at [employer] when he injured his back. Pt describes pain as a dull, aching senstion that improves throughout the day. He has noticed urinary frequency since the incident. Pt has taken 2 ibuprofen tablets with mild relief. He is currently drinking 4-5 8 oz bottles daily. He states that he was consuming protein shakes but stopped them last week. His diet yesterday consisted of chicken, toast, string beans and rice. Pt denies fever, chills, leg swelling, dysuria, nausea, vomiting,  abdominal pain, changes in bowels.  Rash Pt has also noticed patches of very dry to his upper and lower extremities. He requests a cream for treatment at this visit.   Past Medical History:  Diagnosis Date  . Allergy   . Anxiety   . Depression   . Gastroparesis   . Heart murmur   . Migraine headache   . Mitral valve prolapse    Current Outpatient Prescriptions on File Prior to Visit  Medication Sig Dispense Refill  . lidocaine (LIDODERM) 5 % Place 1 patch onto the skin daily. Remove & Discard patch within 12 hours or as directed by MD (Patient not taking: Reported on 05/05/2016) 30 patch 0   No current facility-administered medications on file prior to visit.    Allergies  Allergen Reactions  . Erythromycin Nausea And Vomiting and Anaphylaxis  . Hydrocodone-Acetaminophen Nausea And Vomiting  . Tramadol Nausea And Vomiting  . Cephalexin Diarrhea and Nausea And Vomiting  . Cephalosporins Diarrhea and Nausea And Vomiting  . Compazine [Prochlorperazine Edisylate]     uncontrollable shake - tremor  . Ketoconazole Itching  . Ketorolac Nausea And Vomiting  . Ketorolac Tromethamine     REACTION: Reaction not known  . Latex Itching    REACTION: Reaction not known  . Nalbuphine Other (See Comments)    States loss of consciousness REACTION: Reaction not known  . Oxycodone-Acetaminophen Other (See Comments) and Swelling  . Prednisone Nausea And Vomiting  .  Prochlorperazine Edisylate     REACTION: shakes/tremors  . Topamax [Topiramate] Diarrhea and Nausea And Vomiting  . Cefdinir Diarrhea, Nausea And Vomiting and Rash  . Neosporin [Neomycin-Bacitracin Zn-Polymyx] Rash  . Oxycodone-Acetaminophen Other (See Comments)   Review of Systems  Constitutional: Negative for chills and fever.  Cardiovascular: Negative for leg swelling.  Gastrointestinal: Negative for abdominal pain, nausea and vomiting.  Genitourinary: Positive for frequency. Negative for dysuria.  Musculoskeletal:  Positive for back pain.  Skin: Positive for rash.  Neurological: Negative for headaches.  Psychiatric/Behavioral: Negative for confusion.      Objective:   Physical Exam  Constitutional: He is oriented to person, place, and time. He appears well-developed and well-nourished. No distress.  HENT:  Head: Normocephalic and atraumatic.  Eyes: Conjunctivae and EOM are normal.  Neck: Neck supple. No tracheal deviation present.  Cardiovascular: Normal rate, regular rhythm and normal heart sounds.   Pulmonary/Chest: Effort normal and breath sounds normal. No respiratory distress.  Abdominal: Soft. Normal appearance and bowel sounds are normal. He exhibits no distension. There is no hepatosplenomegaly. There is no tenderness. There is no CVA tenderness.  Musculoskeletal: Normal range of motion. He exhibits no edema.  No T or L spinous process pain or instability. No significant paraspinous  muscle spasms. No SI joint tenderness.  Neurological: He is alert and oriented to person, place, and time.  Skin: Skin is warm and dry.  On the extensor surface of his hands and forearms, there is dry scaly skin with hyperpigmentation groups and areas. No LE edema. Skin on lower legs are shiny and hairless with hyperpigmentation areas and with dry skin.  Psychiatric: He has a normal mood and affect. His behavior is normal.  Nursing note and vitals reviewed.  BP 126/78   Pulse 84   Temp 97.6 F (36.4 C) (Oral)   Resp 16   Ht 5' 11.5" (1.816 m)   Wt 150 lb (68 kg)   SpO2 100%   BMI 20.63 kg/m      Assessment & Plan:   1. Flank pain, acute   2. Dry skin dermatitis - quite severe. Try urea or ammonium lactate. If this does not respond could try triamcinolone mixed with Eucerin.   3. Scrotal pain - patient has been referred to urology Dr. Diona Fanti for further evaluation   4. Vitamin D deficiency - start high-dose replacement   5. Proteinuria, unspecified type - unknown etiology, labs pending. Recheck  in 5 days. Increase water intake and stop protein supplement shakes   6. Urinary frequency     Orders Placed This Encounter  Procedures  . C-reactive protein  . Comprehensive metabolic panel  . Microalbumin/Creatinine Ratio, Urine  . C-reactive protein  . POCT urinalysis dipstick  . POCT Microscopic Urinalysis ([facility])  . POCT CBC  . POCT SEDIMENTATION RATE    Meds ordered this encounter  Medications  . Vitamin D, Ergocalciferol, (DRISDOL) 50000 units CAPS capsule    Sig: Take 1 capsule (50,000 Units total) by mouth every 7 (seven) days.    Dispense:  24 capsule    Refill:  0  . meloxicam (MOBIC) 7.5 MG tablet    Sig: Take 1 tablet (7.5 mg total) by mouth daily.    Dispense:  30 tablet    Refill:  0  . cyclobenzaprine (FLEXERIL) 5 MG tablet    Sig: Take 1 tablet (5 mg total) by mouth at bedtime.    Dispense:  30 tablet    Refill:  1  .  DISCONTD: urea (GORDONS UREA) 40 % ointment    Sig: Apply topically as needed.    Dispense:  30 g    Refill:  0  . ammonium lactate (AMLACTIN) 12 % lotion    Sig: Apply 1 application topically 2 (two) times daily.    Dispense:  567 g    Refill:  0  . urea (CARMOL) 40 % CREA    Sig: Apply 1 application topically 2 (two) times daily.    Dispense:  227 each    Refill:  3    Ok to use whatever urea cream concentration is avail and cost-effective    I personally performed the services described in this documentation, which was scribed in my presence. The recorded information has been reviewed and considered, and addended by me as needed.   [name], M.D.  [facility] [place], [place] [address] [phone] phone [phone] fax  05/10/16 12:55 AM  Results for orders placed or performed in visit on 05/05/16  C-reactive protein  Result Value Ref Range   CRP 0.8 0.0 - 4.9 mg/L  Comprehensive metabolic panel  Result Value Ref Range   Glucose 87 65 - 99 mg/dL   BUN 9 6 - 24 mg/dL    Creatinine, Ser 0.90 0.76 - 1.27 mg/dL   GFR calc non Af Amer 95 >59 mL/min/1.73   GFR calc Af Amer 110 >59 mL/min/1.73   BUN/Creatinine Ratio 10 9 - 20   Sodium 138 134 - 144 mmol/L   Potassium 4.1 3.5 - 5.2 mmol/L   Chloride 101 96 - 106 mmol/L   CO2 21 18 - 29 mmol/L   Calcium 9.1 8.7 - 10.2 mg/dL   Total Protein 6.6 6.0 - 8.5 g/dL   Albumin 4.0 3.5 - 5.5 g/dL   Globulin, Total 2.6 1.5 - 4.5 g/dL   Albumin/Globulin Ratio 1.5 1.2 - 2.2   Bilirubin Total 0.9 0.0 - 1.2 mg/dL   Alkaline Phosphatase 61 39 - 117 IU/L   AST 17 0 - 40 IU/L   ALT 13 0 - 44 IU/L  Microalbumin/Creatinine Ratio, Urine  Result Value Ref Range   Creatinine, Urine 326.8 Not Estab. mg/dL   Albumin, Urine 1,052.4 Not Estab. ug/mL   Microalb/Creat Ratio 322.0 (H) 0.0 - 30.0 mg/g creat  C-reactive protein  Result Value Ref Range   CRP 0.9 0.0 - 4.9 mg/L  POCT urinalysis dipstick  Result Value Ref Range   Color, UA yellow yellow   Clarity, UA clear clear   Glucose, UA negative negative   Bilirubin, UA negative negative   Ketones, POC UA negative negative   Spec Grav, UA 1.025    Blood, UA small (A) negative   pH, UA 6.0    Protein Ur, POC >=300 (A) negative   Urobilinogen, UA 0.2    Nitrite, UA Negative Negative   Leukocytes, UA Negative Negative  POCT Microscopic Urinalysis ([facility])  Result Value Ref Range   WBC,UR,HPF,POC Few (A) None WBC/hpf   RBC,UR,HPF,POC None None RBC/hpf   Bacteria Few (A) None, Too numerous to count   Mucus Present (A) Absent   Epithelial Cells, UR Per Microscopy Few (A) None, Too numerous to count cells/hpf  POCT CBC  Result Value Ref Range   WBC 8.9 4.6 - 10.2 K/uL   Lymph, poc 1.6 0.6 - 3.4   POC LYMPH PERCENT 18.5 10 - 50 %L   MID (cbc) 0.9 0 - 0.9  POC MID % 10.1 0 - 12 %M   POC Granulocyte 6.4 2 - 6.9   Granulocyte percent 71.4 37 - 80 %G   RBC 4.50 (A) 4.69 - 6.13 M/uL   Hemoglobin 14.7 14.1 - 18.1 g/dL   HCT, POC 41.8 (A) 43.5 - 53.7 %   MCV 92.9 80 - 97 fL    MCH, POC 32.8 (A) 27 - 31.2 pg   MCHC 35.2 31.8 - 35.4 g/dL   RDW, POC 13.0 %   Platelet Count, POC 223 142 - 424 K/uL   MPV 8.1 0 - 99.8 fL  POCT SEDIMENTATION RATE  Result Value Ref Range   POCT SED RATE 9 0 - 22 mm/hr

## 2016-05-06 LAB — MICROALBUMIN / CREATININE URINE RATIO
CREATININE, UR: 326.8 mg/dL
MICROALBUM., U, RANDOM: 1052.4 ug/mL
Microalb/Creat Ratio: 322 mg/g creat — ABNORMAL HIGH (ref 0.0–30.0)

## 2016-05-07 LAB — COMPREHENSIVE METABOLIC PANEL
ALK PHOS: 61 IU/L (ref 39–117)
ALT: 13 IU/L (ref 0–44)
AST: 17 IU/L (ref 0–40)
Albumin/Globulin Ratio: 1.5 (ref 1.2–2.2)
Albumin: 4 g/dL (ref 3.5–5.5)
BUN/Creatinine Ratio: 10 (ref 9–20)
BUN: 9 mg/dL (ref 6–24)
Bilirubin Total: 0.9 mg/dL (ref 0.0–1.2)
CO2: 21 mmol/L (ref 18–29)
Calcium: 9.1 mg/dL (ref 8.7–10.2)
Chloride: 101 mmol/L (ref 96–106)
Creatinine, Ser: 0.9 mg/dL (ref 0.76–1.27)
GFR calc Af Amer: 110 mL/min/{1.73_m2} (ref >59)
GFR, EST NON AFRICAN AMERICAN: 95 mL/min/{1.73_m2} (ref >59)
GLOBULIN, TOTAL: 2.6 g/dL (ref 1.5–4.5)
GLUCOSE: 87 mg/dL (ref 65–99)
Potassium: 4.1 mmol/L (ref 3.5–5.2)
Sodium: 138 mmol/L (ref 134–144)
Total Protein: 6.6 g/dL (ref 6.0–8.5)

## 2016-05-07 LAB — C-REACTIVE PROTEIN
CRP: 0.8 mg/L (ref 0.0–4.9)
CRP: 0.9 mg/L (ref 0.0–4.9)

## 2016-05-10 ENCOUNTER — Ambulatory Visit (HOSPITAL_COMMUNITY)
Admission: RE | Admit: 2016-05-10 | Discharge: 2016-05-10 | Disposition: A | Discharge status: Home / Self Care | Payer: PPO | Source: Ambulatory Visit | Attending: Family Medicine | Admitting: Family Medicine

## 2016-05-10 ENCOUNTER — Encounter: Payer: Self-pay | Admitting: Family Medicine

## 2016-05-10 ENCOUNTER — Telehealth: Payer: Self-pay | Admitting: Family Medicine

## 2016-05-10 ENCOUNTER — Ambulatory Visit (INDEPENDENT_AMBULATORY_CARE_PROVIDER_SITE_OTHER): Payer: PPO | Admitting: Family Medicine

## 2016-05-10 VITALS — BP 122/77 | HR 84 | Temp 97.8°F | Resp 16 | Ht 71.5 in | Wt 154.0 lb

## 2016-05-10 DIAGNOSIS — R109 Unspecified abdominal pain: Secondary | ICD-10-CM

## 2016-05-10 DIAGNOSIS — L853 Xerosis cutis: Secondary | ICD-10-CM | POA: Diagnosis not present

## 2016-05-10 DIAGNOSIS — R809 Proteinuria, unspecified: Secondary | ICD-10-CM

## 2016-05-10 LAB — POCT CBC
GRANULOCYTE PERCENT: 74.3 % (ref 37–80)
HEMATOCRIT: 40.9 % — AB (ref 43.5–53.7)
HEMOGLOBIN: 14.3 g/dL (ref 14.1–18.1)
LYMPH, POC: 1.7 (ref 0.6–3.4)
MCH, POC: 32.3 pg — AB (ref 27–31.2)
MCHC: 35 g/dL (ref 31.8–35.4)
MCV: 92.3 fL (ref 80–97)
MID (cbc): 0.2 (ref 0–0.9)
MPV: 8.1 fL (ref 0–99.8)
POC GRANULOCYTE: 5.7 (ref 2–6.9)
POC LYMPH PERCENT: 22.6 %L (ref 10–50)
POC MID %: 3.1 % (ref 0–12)
Platelet Count, POC: 225 10*3/uL (ref 142–424)
RBC: 4.43 M/uL — AB (ref 4.69–6.13)
RDW, POC: 12.9 %
WBC: 7.7 10*3/uL (ref 4.6–10.2)

## 2016-05-10 LAB — RENAL FUNCTION PANEL
Albumin: 4.2 g/dL (ref 3.5–5.5)
BUN/Creatinine Ratio: 13 (ref 9–20)
BUN: 12 mg/dL (ref 6–24)
CALCIUM: 9.9 mg/dL (ref 8.7–10.2)
CO2: 26 mmol/L (ref 18–29)
CREATININE: 0.96 mg/dL (ref 0.76–1.27)
Chloride: 100 mmol/L (ref 96–106)
GFR calc Af Amer: 102 mL/min/{1.73_m2} (ref >59)
GFR, EST NON AFRICAN AMERICAN: 88 mL/min/{1.73_m2} (ref >59)
Glucose: 76 mg/dL (ref 65–99)
PHOSPHORUS: 3.7 mg/dL (ref 2.5–4.5)
Potassium: 4.5 mmol/L (ref 3.5–5.2)
Sodium: 140 mmol/L (ref 134–144)

## 2016-05-10 LAB — POCT URINALYSIS DIP (MANUAL ENTRY)
BILIRUBIN UA: NEGATIVE
GLUCOSE UA: NEGATIVE
Ketones, POC UA: NEGATIVE
LEUKOCYTES UA: NEGATIVE
NITRITE UA: NEGATIVE
Protein Ur, POC: 100 — AB
Spec Grav, UA: 1.02
UROBILINOGEN UA: 0.2
pH, UA: 6.5

## 2016-05-10 LAB — POCT SEDIMENTATION RATE: POCT SED RATE: 8 mm/hr (ref 0–22)

## 2016-05-10 NOTE — Telephone Encounter (Signed)
[facility] ultra sound was calling about pt report he wants to know if he can leave Dr Brigitte Pulse was in the room so I advised pt he can leave and Dr Brigitte Pulse will call him about report please call pt on cell phone

## 2016-05-10 NOTE — Progress Notes (Deleted)
   Subjective:    Patient ID: William [name], male    DOB: 1960/05/03, 56 y.o.   MRN: [id number]  HPI  Mr. William [name] is a delightful 56 year old male who is here for a 5 day follow-up on acute flank pain and proteinuria.  Patient had presented with 2 weeks of acute flank pain. I suspect this was musculoskeletal so he was started on meloxicam 7.5 mg every morning and Flexeril 5 mg daily at bedtime.   During evaluation we noted that his urine dip had a high degree of proteinuria though the microscopy was relatively normal. The proteinuria was confirmed with a microalbumin to creatinine ratio. Patient had been working on his diet and had started some protein supplement shakes which are hopefully the etiology. He was asked to stop these though allowed to maintain a normal adequate amount of protein in his diet and continue plenty of water. Reassuringly there was a normal CMP, CBC, sedimentation rate, and C-reactive protein.  Patient has been suffering with many months of scrotal itching and pain with unknown etiology. He's been seen by multiple physicians and had scrotal ultrasound without improvement. He was considering allergy referral but has decided to opt for urology referral instead and his information has been sent to [facility] at [facility].  Patient had severe dry skin so I recommend he start trying a urea cream or ammonium lactate cream.  Patient reports his headaches and more importantly his mood and ability to think habits been hugely improved since coming off of all his medication, especially the mirtazapine. He is feeling really hopeful about his future. If the headaches return I would recommend he try oxygen as a therapy.  Vitamin D deficiency: Bilateral has been low for a while. Last level one month prior was 10. Patient has been instructed to start on once weekly high-dose vitamin D supplements.  Review of Systems     Objective:   Physical Exam        Assessment &  Plan:  Ua, micoralb F/u for CPE in May

## 2016-05-10 NOTE — Patient Instructions (Addendum)
Do not eat or drink anything until you'll hear from [place] about the time of your renal ultrasound. I will be in the office on Saturday so I would like to see you back then when we can review the lab results from today, the findings of the renal ultrasound, and discuss any additional treatment or evaluation that is needed. In the meantime stop the meloxicam. You can use the flexeril as needed - up to 2 pills three times a day.  If you continue to have pain you may use some tylenol/acetaminophen - but no anti-inflammatories - so no aleve, ibuprofen, motrin, advil, aspirin, excedrin, BC, Goody's, etc.  We will call your cell phone and let you know as soon as we have the time of the renal ultrasound.     IF you received an x-ray today, you will receive an invoice from [facility]. Please contact [facility] at [phone] with questions or concerns regarding your invoice.   IF you received labwork today, you will receive an invoice from [facility]. Please contact [name] at [phone] with questions or concerns regarding your invoice.   Our billing staff will not be able to assist you with questions regarding bills from these companies.  You will be contacted with the lab results as soon as they are available. The fastest way to get your results is to activate your [name] account. Instructions are located on the last page of this paperwork. If you have not heard from [place] regarding the results in 2 weeks, please contact this office.    Proteinuria Proteinuria is a condition in which urine contains more protein than is normal. Proteinuria is either a sign that your body is producing too much protein or a sign that there is a problem with the kidneys. Healthy kidneys prevent most substances that the body needs, including proteins, from leaving the bloodstream and ending up in urine. CAUSES  Proteinuria may be caused by a temporary event or condition such as stress, exercise, or fever,  and go away on its own. Proteinuria may also be a symptom of a more serious condition or disease. Causes of proteinuria include:  A kidney disease caused by:  Diabetes.  High blood pressure (hypertension).   A disease that affects the immune system, such as lupus.  A genetic disease, such as Alport's syndrome.  Medicines that damage the kidneys, such as long-term nonsteroidal anti-inflammatory drugs (NSAIDs).  Poisoning or exposure to toxic substances.  A reoccurring kidney or urinary infection.  Excess protein production in the body caused by:  Multiple myeloma.  Amyloidosis. SYMPTOMS You may have proteinuria without having noticeable symptoms. If there is a large amount of protein in your urine, your urine may look foamy. You may also notice swelling (edema) in your hands, feet, abdomen, or face. DIAGNOSIS To determine whether you have proteinuria, you will need to provide a urine sample. Your urine will then be tested for too much protein and the main blood protein albumin. If your test shows that you have proteinuria, you may need to take additional tests to determine its cause, how much protein is in your urine, and what type of protein is being lost. Tests may include:  Blood tests.  Urine tests.  A blood pressure measurement.  Imaging tests. TREATMENT  Treatment will depend on the cause of your proteinuria. Your caregiver will discuss treatment options with you after you have been diagnosed. If your proteinuria is mild or temporary, no treatment may be necessary. HOME CARE INSTRUCTIONS  Ask your caregiver if monitoring the level of protein in your urine at home using simple testing strips is appropriate for you. Early detection of proteinuria can lead to early and often successful treatment of the condition causing it. This information is not intended to replace advice given to you by your health care provider. Make sure you discuss any questions you have with your  health care provider. Document Released: 06/06/2005 Document Revised: 01/09/2012 Document Reviewed: 03/07/2015 Elsevier Interactive Patient Education  2017 [employer].

## 2016-05-10 NOTE — Telephone Encounter (Signed)
Called pt, results normal. F/u on Mon - 4d

## 2016-05-11 LAB — MICROALBUMIN / CREATININE URINE RATIO
Creatinine, Urine: 105.9 mg/dL
Microalb/Creat Ratio: 501.6 mg/g creat — ABNORMAL HIGH (ref 0.0–30.0)
Microalbumin, Urine: 531.2 ug/mL

## 2016-05-11 LAB — C-REACTIVE PROTEIN: CRP: 1.5 mg/L (ref 0.0–4.9)

## 2016-05-11 NOTE — Progress Notes (Signed)
Subjective:    Patient ID: William [name], male    DOB: 03-29-1961, 56 y.o.   MRN: [id number] Chief Complaint  Patient presents with  . Follow-up    flank pain/ pt states he is still having some pain in his stomach    HPI  William [name] is a delightful 56 year old male who is here for a 5 day follow-up on acute flank pain and proteinuria.  Patient had presented with 2 weeks of acute flank pain. I suspect this was musculoskeletal so he was started on meloxicam 7.5 mg every morning and Flexeril 5 mg daily at bedtime.   He reports this has helped somewhat while it is in his system but his flank pain is still present daily and worsening. Radiates around to the left abdomen. Denies any other GI, GU, or systemic symptoms. see ros. He has stopped doing any protein supplements. Has not been exercising due to pain. Is eating a normal well-rounded diet with an appropriate amount of protein. Trying to drink plenty of water. No supplements, over-the-counter medicines, or vitamins.  During evaluation we noted that his urine dip had a high degree of proteinuria though the microscopy was relatively normal. The proteinuria was confirmed with a microalbumin to creatinine ratio. Patient had been working on his diet and had started some protein supplement shakes which are hopefully the etiology. He was asked to stop these though allowed to maintain a normal adequate amount of protein in his diet and continue plenty of water. Reassuringly there was a normal CMP, CBC, sedimentation rate, and C-reactive protein.  Patient has been suffering with many months of scrotal itching and pain with unknown etiology. He's been seen by multiple physicians and had scrotal ultrasound without improvement. He was considering allergy referral but has decided to opt for urology referral instead and his information has been sent to Dr. Diona Fanti at [facility].  Patient had severe dry skin so I recommend he start trying a urea  cream or ammonium lactate cream.  Patient reports his headaches and more importantly his mood and ability to think habits been hugely improved since coming off of all his medication, especially the mirtazapine. He is feeling really hopeful about his future. If the headaches return I would recommend he try oxygen as a therapy.  Vitamin D deficiency: Bilateral has been low for a while. Last level one month prior was 10. Patient has been instructed to start on once weekly high-dose vitamin D supplements.  Past Medical History:  Diagnosis Date  . Allergy   . Anxiety   . Depression   . Gastroparesis   . Heart murmur   . Migraine headache   . Mitral valve prolapse    No past surgical history on file. Current Outpatient Prescriptions on File Prior to Visit  Medication Sig Dispense Refill  . ammonium lactate (AMLACTIN) 12 % lotion Apply 1 application topically 2 (two) times daily. 567 g 0  . cyclobenzaprine (FLEXERIL) 5 MG tablet Take 1 tablet (5 mg total) by mouth at bedtime. 30 tablet 1  . urea (CARMOL) 40 % CREA Apply 1 application topically 2 (two) times daily. 227 each 3  . Vitamin D, Ergocalciferol, (DRISDOL) 50000 units CAPS capsule Take 1 capsule (50,000 Units total) by mouth every 7 (seven) days. 24 capsule 0  . lidocaine (LIDODERM) 5 % Place 1 patch onto the skin daily. Remove & Discard patch within 12 hours or as directed by MD (Patient not taking: Reported on 05/10/2016) 30 patch 0  No current facility-administered medications on file prior to visit.    Allergies  Allergen Reactions  . Erythromycin Nausea And Vomiting and Anaphylaxis  . Hydrocodone-Acetaminophen Nausea And Vomiting  . Tramadol Nausea And Vomiting  . Cephalexin Diarrhea and Nausea And Vomiting  . Cephalosporins Diarrhea and Nausea And Vomiting  . Compazine [Prochlorperazine Edisylate]     uncontrollable shake - tremor  . Ketoconazole Itching  . Ketorolac Nausea And Vomiting  . Ketorolac Tromethamine      REACTION: Reaction not known  . Latex Itching    REACTION: Reaction not known  . Nalbuphine Other (See Comments)    States loss of consciousness REACTION: Reaction not known  . Oxycodone-Acetaminophen Other (See Comments) and Swelling  . Prednisone Nausea And Vomiting  . Prochlorperazine Edisylate     REACTION: shakes/tremors  . Topamax [Topiramate] Diarrhea and Nausea And Vomiting  . Cefdinir Diarrhea, Nausea And Vomiting and Rash  . Neosporin [Neomycin-Bacitracin Zn-Polymyx] Rash  . Oxycodone-Acetaminophen Other (See Comments)   Family History  Problem Relation Age of Onset  . Hypertension Mother   . Hypertension Brother    Social History   Social History  . Marital status: Married    Spouse name: N/A  . Number of children: N/A  . Years of education: N/A   Social History Main Topics  . Smoking status: Never Smoker  . Smokeless tobacco: Never Used  . Alcohol use No  . Drug use: No  . Sexual activity: Yes    Partners: Female   Other Topics Concern  . None   Social History Narrative  . None   Depression screen [facility] 2/9 05/10/2016 05/05/2016 04/12/2016 03/10/2016 01/24/2016  Decreased Interest 0 0 0 0 0  Down, Depressed, Hopeless 0 0 0 0 0  PHQ - 2 Score 0 0 0 0 0     Review of Systems  Constitutional: Positive for activity change (decrease). Negative for appetite change, chills, diaphoresis, fatigue, fever and unexpected weight change.  Respiratory: Negative for cough and shortness of breath.   Cardiovascular: Negative for chest pain and leg swelling.  Gastrointestinal: Positive for abdominal pain and nausea. Negative for abdominal distention, anal bleeding, constipation, diarrhea and vomiting.  Genitourinary: Positive for flank pain and testicular pain. Negative for decreased urine volume, difficulty urinating, discharge, dysuria, enuresis, frequency, hematuria, penile pain, penile swelling and urgency.  Musculoskeletal: Positive for back pain and myalgias. Negative  for gait problem and joint swelling.  Skin: Negative for color change, rash and wound.  Neurological: Positive for headaches. Negative for dizziness, weakness, light-headedness and numbness.  Hematological: Does not bruise/bleed easily.  Psychiatric/Behavioral: Negative for sleep disturbance. The patient is nervous/anxious.        Objective:   Physical Exam  Constitutional: He appears well-developed and well-nourished. No distress.  HENT:  Head: Normocephalic and atraumatic.  Neck: Normal range of motion. Neck supple. No thyromegaly present.  Cardiovascular: Normal rate, regular rhythm and normal heart sounds.   Pulmonary/Chest: Effort normal and breath sounds normal.  Abdominal: Soft. Normal appearance and bowel sounds are normal. He exhibits no distension and no mass. There is tenderness in the left lower quadrant. There is CVA tenderness (left). There is no rebound and no guarding. No hernia.  Genitourinary: Rectum normal and prostate normal. Rectal exam shows no tenderness, anal tone normal and guaiac negative stool.  Musculoskeletal: He exhibits no edema.  Lymphadenopathy:    He has no cervical adenopathy.  Skin: No rash noted. He is not diaphoretic.   BP  122/77   Pulse 84   Temp 97.8 F (36.6 C) (Oral)   Resp 16   Ht 5' 11.5" (1.816 m)   Wt 154 lb (69.9 kg)   SpO2 99%   BMI 21.18 kg/m      Assessment & Plan:   F/u for CPE in May 1. Proteinuria, unspecified type - significantly improved from 300 in urine to 100 on dip today. However etiology is unknown. Patient was taking a lot of protein supplements which is hopefully the cause though as he has stopped all of these and then pushing fluids I would've expected more improvement at this point. Due to new onset and concerning left flank and abdominal pain I think we need to proceed with imaging of his kidneys. Reviewed risks benefits between CT and ultrasound decide to proceed with latter at this time. Stat renal ultrasound  obtained and reassuringly is normal. Labs with renal function, CBC, and inflammatory markers are also continued to be reassuring and normal. He's not been exercising, not been ill At this point I think we should continue with low protein diet and plenty of fluids. We will check a 24-hour urine protein to further assess. Possible that patient has an orthostatic proteinuria.  2. Flank pain - Could be musculoskeletal as it is seem to respond to meloxicam and Flexeril. However due to ongoing proteinuria and concerns for renal etiology will have patient hold meloxicam and other NSAIDs. Okay to increase Flexeril up to 10 3 times a day when necessary the patient is significantly limited by sedation side effect.   3. Abdominal pain, left lateral    4.   Dry skin dermatitis-patient obtained either the urea or the ammonium lactate and are denoting some improvement on hands where he is using it. Suggest spreading over more body Orders Placed This Encounter  Procedures  . US Renal    Standing Status:   Future    Number of Occurrences:   1    Standing Expiration Date:   07/08/2017    Order Specific Question:   Reason for Exam (SYMPTOM  OR DIAGNOSIS REQUIRED)    Answer:   new onset persistent proteinuria    Order Specific Question:   Preferred imaging location?    Answer:   [facility]    Order Specific Question:   Call Results- Best Contact Number?    Answer:   [phone] ask for Dr Brigitte Pulse and hold patient   . Microalbumin/Creatinine Ratio, Urine  . C-reactive protein  . Renal Function Panel  . Protein, urine, 24 hour    Standing Status:   Future    Standing Expiration Date:   05/11/2017  . Creatinine, Urine, 24 Hour    Standing Status:   Future    Standing Expiration Date:   05/11/2017  . POCT urinalysis dipstick  . POCT CBC  . POCT SEDIMENTATION RATE      William [name], M.D.  [facility] [address] [place], [place] [address] [phone] phone [phone] fax  05/11/16 10:55 AM  Results for orders placed or performed in visit on 05/10/16  C-reactive protein  Result Value Ref Range   CRP 1.5 0.0 - 4.9 mg/L  Renal Function Panel  Result Value Ref Range   Glucose 76 65 - 99 mg/dL   BUN 12 6 - 24 mg/dL   Creatinine, Ser 0.96 0.76 - 1.27 mg/dL   GFR calc non Af Amer 88 >59 mL/min/1.73  GFR calc Af Amer 102 >59 mL/min/1.73   BUN/Creatinine Ratio 13 9 - 20   Sodium 140 134 - 144 mmol/L   Potassium 4.5 3.5 - 5.2 mmol/L   Chloride 100 96 - 106 mmol/L   CO2 26 18 - 29 mmol/L   Calcium 9.9 8.7 - 10.2 mg/dL   Phosphorus 3.7 2.5 - 4.5 mg/dL   Albumin 4.2 3.5 - 5.5 g/dL  POCT urinalysis dipstick  Result Value Ref Range   Color, UA yellow yellow   Clarity, UA clear clear   Glucose, UA negative negative   Bilirubin, UA negative negative   Ketones, POC UA negative negative   Spec Grav, UA 1.020    Blood, UA trace-intact (A) negative   pH, UA 6.5    Protein Ur, POC =100 (A) negative   Urobilinogen, UA 0.2    Nitrite, UA Negative Negative   Leukocytes, UA Negative Negative  POCT CBC  Result Value Ref Range   WBC 7.7 4.6 - 10.2 K/uL   Lymph, poc 1.7 0.6 - 3.4   POC LYMPH PERCENT 22.6 10 - 50 %L   MID (cbc) 0.2 0 - 0.9   POC MID % 3.1 0 - 12 %M   POC Granulocyte 5.7 2 - 6.9   Granulocyte percent 74.3 37 - 80 %G   RBC 4.43 (A) 4.69 - 6.13 M/uL   Hemoglobin 14.3 14.1 - 18.1 g/dL   HCT, POC 40.9 (A) 43.5 - 53.7 %   MCV 92.3 80 - 97 fL   MCH, POC 32.3 (A) 27 - 31.2 pg   MCHC 35.0 31.8 - 35.4 g/dL   RDW, POC 12.9 %   Platelet Count, POC 225 142 - 424 K/uL   MPV 8.1 0 - 99.8 fL  POCT SEDIMENTATION RATE  Result Value Ref Range   POCT SED RATE 8 0 - 22 mm/hr   US Renal  Result Date: 05/10/2016 CLINICAL DATA:  Protein urea.  Flank pain for 3 weeks. EXAM: RENAL / URINARY TRACT ULTRASOUND COMPLETE COMPARISON:  None. FINDINGS: Right Kidney: Length: 10.3 cm. Echogenicity within normal limits. No mass or hydronephrosis visualized.  Left Kidney: Length: 10.1 cm. Echogenicity within normal limits. No mass or hydronephrosis visualized. Bladder: Appears normal for degree of bladder distention. IMPRESSION: 1. Normal renal sonogram. Electronically Signed   By: Kerby Moors M.D.   On: 05/10/2016 11:56

## 2016-05-11 NOTE — Addendum Note (Signed)
Addended by: [name] on: 05/11/2016 10:54 AM   Modules accepted: Orders

## 2016-05-11 NOTE — Addendum Note (Signed)
Addended by: [name] on: 05/11/2016 10:21 AM   Modules accepted: Orders

## 2016-05-13 NOTE — Progress Notes (Signed)
Subjective:    Patient ID: William [name], male    DOB: [birth year], 56 y.o.   MRN: [id number] Chief Complaint  Patient presents with  . Follow-up    hospital visit and proteinuria    HPI   William [name] is a delightful 56 year old man here for a follow-up visit for left sided flank pain radiating around to his left lower quadrant that began approximately 3 weeks prior around December 23.  His first visit for evaluation of the pain 9 days prior was only significant for a urinalysis with >300 protein and a small amount of blood though the microscopy was relatively normal.  Pt had recently started some protein drinks on my recommendation so I had him stop all supplements and eat a regular diet with a normal (~25%) amount of protein as well as push fluids which he did. Additional evaluation at that visit was normal with benign exam without sig back or abdominal pain elicited on exam. The proteinuria was confirmed with a microalbumin to creatinine ratio. Reassuringly there was a normal CMP, CBC, sedimentation rate, and C-reactive protein.  At last visit 4d prior, proteinuria on dip persisted though was improved from 300 -> 100 though advised this could be artificially reassuring due to changes in hydration.  His left flank pain was persisting and left abdominal pain worsening so we obtained a stat renal US same day which was normal. Labs with renal function, CBC, and inflammatory markers also continued to be reassuring and normal. Pt had stopped exercising, stopped all supp/vits, and diet seemed very normal (confirmed by wife) so we proceeded with the 24 hour urine protein which he dropped off today.  He reports the left flank pain and left lower abdominal pain persists unchanged. He has not been using any nsaids per my rec.  He states he does take a flexeril 87m qhs which he thinks helps (but asleep) - has not tried it during the day, nor tried tylenol as he is really trying to minimize ALL meds. He  has been having daily BM but thinks it might be poss that he is constipated.  No GI/GU changes with his abd pain, does not seem colicky or move. Does seem worse with certain positions or lifting.   Hx:  Patient has been suffering with many months of scrotal itching and pain with unknown etiology. He's been seen by multiple physicians and had scrotal ultrasound without improvement. He was considering allergy referral but has decided to opt for urology referral instead and his information has been sent to Dr. DDiona Fantiat [facility]   Past Medical History:  Diagnosis Date  . Allergy   . Anxiety   . Depression   . Gastroparesis   . Heart murmur   . Migraine headache   . Mitral valve prolapse    History reviewed. No pertinent surgical history. Current Outpatient Prescriptions on File Prior to Visit  Medication Sig Dispense Refill  . ammonium lactate (AMLACTIN) 12 % lotion Apply 1 application topically 2 (two) times daily. 567 g 0  . cyclobenzaprine (FLEXERIL) 5 MG tablet Take 1 tablet (5 mg total) by mouth at bedtime. 30 tablet 1  . urea (CARMOL) 40 % CREA Apply 1 application topically 2 (two) times daily. 227 each 3  . Vitamin D, Ergocalciferol, (DRISDOL) 50000 units CAPS capsule Take 1 capsule (50,000 Units total) by mouth every 7 (seven) days. 24 capsule 0  . lidocaine (LIDODERM) 5 % Place 1 patch onto the skin daily. Remove & Discard  patch within 12 hours or as directed by MD (Patient not taking: Reported on 05/14/2016) 30 patch 0   No current facility-administered medications on file prior to visit.    Allergies  Allergen Reactions  . Erythromycin Nausea And Vomiting and Anaphylaxis  . Hydrocodone-Acetaminophen Nausea And Vomiting  . Tramadol Nausea And Vomiting  . Cephalexin Diarrhea and Nausea And Vomiting  . Cephalosporins Diarrhea and Nausea And Vomiting  . Compazine [Prochlorperazine Edisylate]     uncontrollable shake - tremor  . Ketoconazole Itching  . Ketorolac Nausea And  Vomiting  . Ketorolac Tromethamine     REACTION: Reaction not known  . Latex Itching    REACTION: Reaction not known  . Nalbuphine Other (See Comments)    States loss of consciousness REACTION: Reaction not known  . Oxycodone-Acetaminophen Other (See Comments) and Swelling  . Prednisone Nausea And Vomiting  . Prochlorperazine Edisylate     REACTION: shakes/tremors  . Topamax [Topiramate] Diarrhea and Nausea And Vomiting  . Cefdinir Diarrhea, Nausea And Vomiting and Rash  . Neosporin [Neomycin-Bacitracin Zn-Polymyx] Rash  . Oxycodone-Acetaminophen Other (See Comments)   Family History  Problem Relation Age of Onset  . Hypertension Mother   . Hypertension Brother    Social History   Social History  . Marital status: Married    Spouse name: N/A  . Number of children: N/A  . Years of education: N/A   Social History Main Topics  . Smoking status: Never Smoker  . Smokeless tobacco: Never Used  . Alcohol use No  . Drug use: No  . Sexual activity: Yes    Partners: Female   Other Topics Concern  . None   Social History Narrative  . None   Depression screen [facility] 2/9 05/14/2016 05/10/2016 05/05/2016 04/12/2016 03/10/2016  Decreased Interest 0 0 0 0 0  Down, Depressed, Hopeless 0 0 0 0 0  PHQ - 2 Score 0 0 0 0 0      Review of Systems  Constitutional: Positive for fatigue. Negative for activity change, appetite change, chills, fever and unexpected weight change.  Cardiovascular: Negative for leg swelling.  Gastrointestinal: Positive for abdominal pain and constipation. Negative for abdominal distention, anal bleeding, blood in stool, diarrhea, nausea, rectal pain and vomiting.  Genitourinary: Positive for genital sores (having long standing scrotal itching has been evaluated by several of my colleagues inc pt's prior PCP for > 1 yr, now referral to urology P for further eval, will try derm if not helpful, unchanged.) and testicular pain. Negative for decreased urine volume,  difficulty urinating, discharge, dysuria, enuresis, flank pain, frequency, hematuria, penile pain, penile swelling, scrotal swelling and urgency.  Musculoskeletal: Positive for back pain. Negative for arthralgias, gait problem and joint swelling.  Skin: Negative for color change, pallor, rash and wound.  Allergic/Immunologic: Negative for immunocompromised state.  Neurological: Positive for headaches. Negative for dizziness, syncope, light-headedness and numbness.  Hematological: Negative for adenopathy. Does not bruise/bleed easily.  Psychiatric/Behavioral: Negative for agitation, behavioral problems, dysphoric mood and self-injury. The patient is nervous/anxious. The patient is not hyperactive.        Objective:   Physical Exam  Constitutional: He appears well-developed and well-nourished. No distress.  HENT:  Head: Normocephalic and atraumatic.  Neck: Normal range of motion. Neck supple. No thyromegaly present.  Cardiovascular: Normal rate, regular rhythm and normal heart sounds.   Pulmonary/Chest: Effort normal and breath sounds normal.  Abdominal: Soft. Normal appearance and bowel sounds are normal. He exhibits no distension and no  mass. There is no hepatosplenomegaly. There is tenderness in the left lower quadrant. There is no rebound, no guarding, no CVA tenderness and negative Murphy's sign. No hernia.  Genitourinary: Rectum normal and prostate normal. Rectal exam shows no tenderness, anal tone normal and guaiac negative stool.  Lymphadenopathy:    He has no cervical adenopathy.  Skin: He is not diaphoretic.  Psychiatric: His mood appears anxious.     BP 117/70   Pulse 95   Temp 98.1 F (36.7 C) (Oral)   Resp 16   Ht 5' 11.5" (1.816 m)   Wt 153 lb (69.4 kg)   SpO2 100%   BMI 21.04 kg/m    Assessment & Plan:   1. Microscopic hematuria   2. Proteinuria, unspecified type - severe spot proteinuria persisting w/o any improvement even now pt has been off all protein supp  (all supp) and on low-normal protein diet since 1/6 (9d) when proteinuria was first noted.  4d prior has had nml renal US, repeated nml cbc, cmp, esr/crp.  However, this is new, we have dip urinalysis in Epic at least annually dating back to 2010 with no prior proteinuria at all - all 11 prior UAs with neg protein. Last prior to the abd 1/6 was 08/10/2015  - 9 mos prior, nml  3. Flank pain - unknown etiology, sounds MSK since only worsens with positions/lifting, nml renal US reassuring. Try increasing flexeril to see if any relief.  Try heat, Try APAP.  4. Abdominal pain, left lateral - try miralax. If sxs persist, may need to go with CT  5. Nephrotic range proteinuria - microalb/cr ration already increased to 501 mg/g today from 322 just 5d prior  Pt handed in 24 hour urine protein today - will call pt with results to see whether this could be some prev undetected orthostatic proteinuria or if these spot findings are real.  It is odd that pt really has no other sxs and labs have looked great.  Pt is VERY anxious about this so extensive time spend reassuring pt and wife that we will cont work-up and eval in a timely fashion to find etiology and trx if needed but as labs look great no sign at this point that he needs to be overly concerned about severe morbidity from this. .  .  I certainly hope that is true but the whole this is very odd. Will def appreciate nephrology insight.    Orders Placed This Encounter  Procedures  . Ambulatory referral to Nephrology    Referral Priority:   Urgent    Referral Type:   Consultation    Referral Reason:   Specialty Services Required    Requested Specialty:   Nephrology    Number of Visits Requested:   1  . POCT urinalysis dipstick    Meds ordered this encounter  Medications  . polyethylene glycol powder (GLYCOLAX/MIRALAX) powder    Sig: Take 255 g by mouth once.    Dispense:  255 g    Refill:  2    [name], M.D.  [facility] [address] [place], [place] [address] [phone] phone [phone] fax  05/20/16 2:36 PM  Results for orders placed or performed in visit on 05/14/16  Protein, urine, 24 hour  Result Value Ref Range   Protein, Ur 23.7 Not Estab. mg/dL   Protein, 24H Urine 450 (H) 30 - 150 mg/24 hr  Creatinine, Urine, 24 Hour  Result  Value Ref Range   Creatinine, Urine 56.5 Not Estab. mg/dL   Creatinine, 24H Ur 1,074 1,000 - 2,000 mg/24 hr  POCT urinalysis dipstick  Result Value Ref Range   Color, UA yellow yellow   Clarity, UA clear clear   Glucose, UA negative negative   Bilirubin, UA negative negative   Ketones, POC UA negative negative   Spec Grav, UA 1.025    Blood, UA small (A) negative   pH, UA 6.0    Protein Ur, POC >=300 (A) negative   Urobilinogen, UA 0.2    Nitrite, UA Negative Negative   Leukocytes, UA Negative Negative

## 2016-05-14 ENCOUNTER — Ambulatory Visit (INDEPENDENT_AMBULATORY_CARE_PROVIDER_SITE_OTHER): Payer: PPO | Admitting: Family Medicine

## 2016-05-14 ENCOUNTER — Encounter: Payer: Self-pay | Admitting: Family Medicine

## 2016-05-14 VITALS — BP 117/70 | HR 95 | Temp 98.1°F | Resp 16 | Ht 71.5 in | Wt 153.0 lb

## 2016-05-14 DIAGNOSIS — R809 Proteinuria, unspecified: Secondary | ICD-10-CM | POA: Diagnosis not present

## 2016-05-14 DIAGNOSIS — R109 Unspecified abdominal pain: Secondary | ICD-10-CM | POA: Diagnosis not present

## 2016-05-14 DIAGNOSIS — R3129 Other microscopic hematuria: Secondary | ICD-10-CM | POA: Diagnosis not present

## 2016-05-14 LAB — POCT URINALYSIS DIP (MANUAL ENTRY)
BILIRUBIN UA: NEGATIVE
BILIRUBIN UA: NEGATIVE
GLUCOSE UA: NEGATIVE
Leukocytes, UA: NEGATIVE
Nitrite, UA: NEGATIVE
Protein Ur, POC: >=300 — AB
SPEC GRAV UA: 1.025
UROBILINOGEN UA: 0.2
pH, UA: 6

## 2016-05-14 MED ORDER — POLYETHYLENE GLYCOL 3350 17 GM/SCOOP PO POWD: 1.0000 | Freq: Once | ORAL | 2 refills | Status: AC

## 2016-05-14 NOTE — Patient Instructions (Addendum)
IF you received an x-ray today, you will receive an invoice from [facility]. Please contact [facility] at [phone] with questions or concerns regarding your invoice.   IF you received labwork today, you will receive an invoice from [facility]. Please contact [name] at [phone] with questions or concerns regarding your invoice.   Our billing staff will not be able to assist you with questions regarding bills from these companies.  You will be contacted with the lab results as soon as they are available. The fastest way to get your results is to activate your [name] account. Instructions are located on the last page of this paperwork. If you have not heard from [place] regarding the results in 2 weeks, please contact this office.    Use the flexeril at night and tylenol during the day. You can likely use the methocarbamol during the day as well (a less strong muscle relaxant you can use during the day). Apply heat to the muscle and try a dose of miralax up to twice a day to cleanse your colon. I will call you tomorrow when your urine results come in.   I have referred you to [facility].  Proteinuria Proteinuria is a condition in which urine contains more protein than is normal. Proteinuria is either a sign that your body is producing too much protein or a sign that there is a problem with the kidneys. Healthy kidneys prevent most substances that the body needs, including proteins, from leaving the bloodstream and ending up in urine. CAUSES  Proteinuria may be caused by a temporary event or condition such as stress, exercise, or fever, and go away on its own. Proteinuria may also be a symptom of a more serious condition or disease. Causes of proteinuria include:  A kidney disease caused by:  Diabetes.  High blood pressure (hypertension).   A disease that affects the immune system, such as lupus.  A genetic disease, such as Alport's  syndrome.  Medicines that damage the kidneys, such as long-term nonsteroidal anti-inflammatory drugs (NSAIDs).  Poisoning or exposure to toxic substances.  A reoccurring kidney or urinary infection.  Excess protein production in the body caused by:  Multiple myeloma.  Amyloidosis. SYMPTOMS You may have proteinuria without having noticeable symptoms. If there is a large amount of protein in your urine, your urine may look foamy. You may also notice swelling (edema) in your hands, feet, abdomen, or face. DIAGNOSIS To determine whether you have proteinuria, you will need to provide a urine sample. Your urine will then be tested for too much protein and the main blood protein albumin. If your test shows that you have proteinuria, you may need to take additional tests to determine its cause, how much protein is in your urine, and what type of protein is being lost. Tests may include:  Blood tests.  Urine tests.  A blood pressure measurement.  Imaging tests. TREATMENT  Treatment will depend on the cause of your proteinuria. Your caregiver will discuss treatment options with you after you have been diagnosed. If your proteinuria is mild or temporary, no treatment may be necessary. HOME CARE INSTRUCTIONS Ask your caregiver if monitoring the level of protein in your urine at home using simple testing strips is appropriate for you. Early detection of proteinuria can lead to early and often successful treatment of the condition causing it. This information is not intended to replace advice given to you by your health care provider. Make sure you discuss  any questions you have with your health care provider. Document Released: 06/06/2005 Document Revised: 01/09/2012 Document Reviewed: 03/07/2015 Elsevier Interactive Patient Education  2017 [employer].

## 2016-05-15 LAB — PROTEIN, URINE, 24 HOUR
PROTEIN 24H UR: 450 mg/(24.h) — AB (ref 30–150)
PROTEIN UR: 23.7 mg/dL

## 2016-05-15 LAB — CREATININE, URINE, 24 HOUR
CREATININE, UR: 56.5 mg/dL
Creatinine, 24H Ur: 1074 mg/24 hr (ref 1000–2000)

## 2016-05-17 ENCOUNTER — Ambulatory Visit: Payer: PPO | Admitting: Family Medicine

## 2016-05-20 ENCOUNTER — Other Ambulatory Visit: Payer: Self-pay | Admitting: Family Medicine

## 2016-05-20 DIAGNOSIS — R809 Proteinuria, unspecified: Secondary | ICD-10-CM

## 2016-05-20 NOTE — Progress Notes (Signed)
Called [facility] and had UPEP with interpretation and reflex to IFE added on to 24 hr urine due to proteinuria

## 2016-05-20 NOTE — Progress Notes (Signed)
Subjective:    Patient ID: William [name], male    DOB: Jun 26, 1960, 56 y.o.   MRN: [id number] Chief Complaint  Patient presents with  . Follow-up    lower back pain    HPI  William [name] is a delightful 56 year old man here for his 4th visit to evaluate left-sided flank pain radiating around to his left lower abdomen of a dull aching nature that began approximately 1 mo prior around December 23 after lifting cases of water.  His initial evaluation of the pain on 1/6 was significant only for a urinalysis with >300 protein and a small amount of blood though the microscopy was relatively normal. His exam was benign and I suspected his pain was a muscular strain.  Pt had recently started some protein drinks on my recommendation so I had him stop all supplements and eat a regular diet with a normal (~25%) amount of protein as well as push fluids, which he did. Pt had stopped exercising, stopped all supp/vits, and very normal diet has been repeatedly confirmed by wife.  Over the past 2 weeks, his left flank pain, LLQ abd pain, and severe proteinuria on spot urine dipsick has persisted.  Further evaluation has been unrevealing as to the etiology of the above symptoms and has included a benign exam, normal renal US, and repeated normal serum labs inc renal panel, cmp, cbc, esr, crp. Initial microalb/Cr ratio (to confirm dip) was 322 and then increased to 501 mg/g 5d later. 24 hr urine protein submitted 1/15 showed 464m/24 - 3x the ULN.  UPEP with reflex IFE on the 24 hr urine is P along with nephrology referral.    Proteinuria is new, we have dip urinalysis in Epic at least annually dating back to 2010 with no prior proteinuria at all - all 11 prior UAs with neg protein. Last prior to the abd 1/6 was 08/10/2015  - 57 mos prior, nml  so we proceeded with the 24 hour urine protein which he dropped off today.  Dr. KJake Michaelisdiagnosed pt with IBD-mixed and was on Amitiza. Colonoscopy with Dr. GPenelope Coopin 2007 -  diagnosed with gastroparesis, now sees Dr. OPaulita Fujita  Scrotal itching > 1 yr s/p several evals and attempted treatments by my colleagues w/o benefit. Normal scrotal [facility] 2/17  Very dry skin - improving with urea   Past Medical History:  Diagnosis Date  . Allergy   . Anxiety   . Depression   . Gastroparesis   . Heart murmur   . Migraine headache   . Mitral valve prolapse    History reviewed. No pertinent surgical history. Current Outpatient Prescriptions on File Prior to Visit  Medication Sig Dispense Refill  . ammonium lactate (AMLACTIN) 12 % lotion Apply 1 application topically 2 (two) times daily. 567 g 0  . cyclobenzaprine (FLEXERIL) 5 MG tablet Take 1 tablet (5 mg total) by mouth at bedtime. 30 tablet 1  . lidocaine (LIDODERM) 5 % Place 1 patch onto the skin daily. Remove & Discard patch within 12 hours or as directed by MD 30 patch 0  . urea (CARMOL) 40 % CREA Apply 1 application topically 2 (two) times daily. 227 each 3  . Vitamin D, Ergocalciferol, (DRISDOL) 50000 units CAPS capsule Take 1 capsule (50,000 Units total) by mouth every 7 (seven) days. 24 capsule 0   No current facility-administered medications on file prior to visit.    Allergies  Allergen Reactions  . Erythromycin Nausea And Vomiting and Anaphylaxis  .  Hydrocodone-Acetaminophen Nausea And Vomiting  . Tramadol Nausea And Vomiting  . Cephalexin Diarrhea and Nausea And Vomiting  . Cephalosporins Diarrhea and Nausea And Vomiting  . Compazine [Prochlorperazine Edisylate]     uncontrollable shake - tremor  . Ketoconazole Itching  . Ketorolac Nausea And Vomiting  . Ketorolac Tromethamine     REACTION: Reaction not known  . Latex Itching    REACTION: Reaction not known  . Nalbuphine Other (See Comments)    States loss of consciousness REACTION: Reaction not known  . Oxycodone-Acetaminophen Other (See Comments) and Swelling  . Prednisone Nausea And Vomiting  . Prochlorperazine Edisylate      REACTION: shakes/tremors  . Topamax [Topiramate] Diarrhea and Nausea And Vomiting  . Cefdinir Diarrhea, Nausea And Vomiting and Rash  . Neosporin [Neomycin-Bacitracin Zn-Polymyx] Rash  . Oxycodone-Acetaminophen Other (See Comments)   Family History  Problem Relation Age of Onset  . Hypertension Mother   . Hypertension Brother    Social History   Social History  . Marital status: Married    Spouse name: N/A  . Number of children: N/A  . Years of education: N/A   Social History Main Topics  . Smoking status: Never Smoker  . Smokeless tobacco: Never Used  . Alcohol use No  . Drug use: No  . Sexual activity: Yes    Partners: Female   Other Topics Concern  . None   Social History Narrative  . None   Depression screen [facility] 2/9 05/31/2016 05/21/2016 05/14/2016 05/10/2016 05/05/2016  Decreased Interest 0 0 0 0 0  Down, Depressed, Hopeless 0 0 0 0 0  PHQ - 2 Score 0 0 0 0 0    Review of Systems See hpi     Objective:   Physical Exam  Constitutional: He appears well-developed and well-nourished. No distress.  HENT:  Head: Normocephalic and atraumatic.  Neck: Normal range of motion. Neck supple. No thyromegaly present.  Cardiovascular: Normal rate, regular rhythm and normal heart sounds.   Pulmonary/Chest: Effort normal and breath sounds normal.  Abdominal: Soft. Normal appearance and bowel sounds are normal. He exhibits no distension and no mass. There is no hepatosplenomegaly. There is tenderness in the left lower quadrant. There is no rigidity, no rebound, no guarding and no CVA tenderness. No hernia.  Genitourinary: Rectum normal and prostate normal. Rectal exam shows no tenderness, anal tone normal and guaiac negative stool.  Lymphadenopathy:    He has no cervical adenopathy.  Skin: He is not diaphoretic.    BP 130/74 (BP Location: Right Arm, Patient Position: Sitting, Cuff Size: Small)   Pulse 92   Temp 98.4 F (36.9 C) (Oral)   Ht 5' 11.5" (1.816 m)   Wt 150 lb (68  kg)   SpO2 99%   BMI 20.63 kg/m    Results for orders placed or performed in visit on 05/14/16  Protein, urine, 24 hour  Result Value Ref Range   Protein, Ur 23.7 Not Estab. mg/dL   Protein, 24H Urine 450 (H) 30 - 150 mg/24 hr  Creatinine, Urine, 24 Hour  Result Value Ref Range   Creatinine, Urine 56.5 Not Estab. mg/dL   Creatinine, 24H Ur 1,074 1,000 - 2,000 mg/24 hr  POCT urinalysis dipstick  Result Value Ref Range   Color, UA yellow yellow   Clarity, UA clear clear   Glucose, UA negative negative   Bilirubin, UA negative negative   Ketones, POC UA negative negative   Spec Grav, UA 1.025  Blood, UA small (A) negative   pH, UA 6.0    Protein Ur, POC >=300 (A) negative   Urobilinogen, UA 0.2    Nitrite, UA Negative Negative   Leukocytes, UA Negative Negative       Assessment & Plan:  Nephrology eval P.  While 24 hr urine protein is 3 times the ULN at 476m it is far below the conceirng 3g threshold. Added on a UPEP with interpretation and reflex to IFE (I think [facility] test # was [id number]. . .  _ to the 24 hr urine protein.  . Formulas that incorporate race and weight with or without serum phosphorous in addition to age and sex may improve the estimation of Creatinine excretion.  Daily creatinine excretion should be 20 to 25 mg/kg (177 to 221 micromol/kg) of lean body weight in men. From the ages of 584to 947years, there is a progressive 519percent decline in creatinine excretion (to about 10 mg/kg in men   As patient only had 450 mg and his 24-hour protein urine this leaves the differential diagnosis for his proteinuria wide open. I do think we should proceed with abdominal pelvic CT to ensure there is not a post renal etiology such as atypical kidney stone. The renal ultrasound commented on his kidneys and bladder but did not look at the ureters which seems necessary considering his persistent pain.  1. Proteinuria, unspecified type   2. Abdominal pain, left lower  quadrant   3. Acute left flank pain   4. Irritable bowel syndrome with constipation     Orders Placed This Encounter  Procedures  . Urine culture  . CT ABDOMEN PELVIS W WO CONTRAST    This exam should ONLY be ordered for initial diagnosis or follow up of known pancreatic/liver/renal/bladder masses.    Standing Status:   Future    Number of Occurrences:   1    Standing Expiration Date:   08/19/2017    Order Specific Question:   If indicated for the ordered procedure, I authorize the administration of contrast media per Radiology protocol    Answer:   Yes    Order Specific Question:   Reason for Exam (SYMPTOM  OR DIAGNOSIS REQUIRED)    Answer:   over 1 mo of left flank, left lower quadrant abd pain, and persistent proteinuria, r/o kidney stone, urethral abnml, diverticulitis, GU mass, colon mass    Order Specific Question:   Preferred imaging location?    Answer:   [facility]  . Urinalysis, microscopic only  . Microalbumin/Creatinine Ratio, Urine  . POCT urinalysis dipstick    EDelman Cheadle M.D.  [facility] [phone]  06/15/16 9:04 PM

## 2016-05-21 ENCOUNTER — Ambulatory Visit (HOSPITAL_COMMUNITY)
Admission: RE | Admit: 2016-05-21 | Discharge: 2016-05-21 | Disposition: A | Discharge status: Home / Self Care | Payer: PPO | Source: Ambulatory Visit | Attending: Family Medicine | Admitting: Family Medicine

## 2016-05-21 ENCOUNTER — Encounter (HOSPITAL_COMMUNITY): Payer: Self-pay

## 2016-05-21 ENCOUNTER — Encounter: Payer: Self-pay | Admitting: Family Medicine

## 2016-05-21 ENCOUNTER — Ambulatory Visit (INDEPENDENT_AMBULATORY_CARE_PROVIDER_SITE_OTHER): Payer: PPO | Admitting: Family Medicine

## 2016-05-21 VITALS — BP 130/74 | HR 92 | Temp 98.4°F | Ht 71.5 in | Wt 150.0 lb

## 2016-05-21 DIAGNOSIS — R109 Unspecified abdominal pain: Secondary | ICD-10-CM | POA: Insufficient documentation

## 2016-05-21 DIAGNOSIS — K581 Irritable bowel syndrome with constipation: Secondary | ICD-10-CM

## 2016-05-21 DIAGNOSIS — R1032 Left lower quadrant pain: Secondary | ICD-10-CM | POA: Diagnosis not present

## 2016-05-21 DIAGNOSIS — R809 Proteinuria, unspecified: Secondary | ICD-10-CM

## 2016-05-21 LAB — POCT URINALYSIS DIP (MANUAL ENTRY)
Bilirubin, UA: NEGATIVE
Glucose, UA: NEGATIVE
Ketones, POC UA: NEGATIVE
LEUKOCYTES UA: NEGATIVE
Nitrite, UA: NEGATIVE
Spec Grav, UA: 1.015
UROBILINOGEN UA: 0.2
pH, UA: 7

## 2016-05-21 MED ORDER — IOPAMIDOL (ISOVUE-300) INJECTION 61%
30.0000 mL | Freq: Once | INTRAVENOUS | Status: AC | PRN
  Administered 2016-05-21: 30 mL via ORAL

## 2016-05-21 MED ORDER — IOPAMIDOL (ISOVUE-300) INJECTION 61%
100.0000 mL | Freq: Once | INTRAVENOUS | Status: AC | PRN
  Administered 2016-05-21: 100 mL via INTRAVENOUS

## 2016-05-21 MED ORDER — IOPAMIDOL (ISOVUE-300) INJECTION 61%
INTRAVENOUS | Status: AC
  Filled 2016-05-21: qty 30

## 2016-05-21 NOTE — Patient Instructions (Addendum)
     IF you received an x-ray today, you will receive an invoice from [facility]. Please contact [facility] at [phone] with questions or concerns regarding your invoice.   IF you received labwork today, you will receive an invoice from [facility]. Please contact [name] at [phone] with questions or concerns regarding your invoice.   Our billing staff will not be able to assist you with questions regarding bills from these companies.  You will be contacted with the lab results as soon as they are available. The fastest way to get your results is to activate your [name] account. Instructions are located on the last page of this paperwork. If you have not heard from [place] regarding the results in 2 weeks, please contact this office.    [facility] go to the first floor Radiology [phone], they are expected the patient to arrive now.

## 2016-05-22 ENCOUNTER — Ambulatory Visit: Payer: PPO

## 2016-05-22 LAB — CYTOLOGY, URINE

## 2016-05-22 LAB — MICROALBUMIN / CREATININE URINE RATIO
CREATININE, UR: 61.9 mg/dL
MICROALB/CREAT RATIO: 748.3 mg/g{creat} — AB (ref 0.0–30.0)
MICROALBUM., U, RANDOM: 463.2 ug/mL

## 2016-05-22 LAB — URINALYSIS, MICROSCOPIC ONLY
BACTERIA UA: NONE SEEN
Casts: NONE SEEN /lpf
RBC MICROSCOPIC, UA: NONE SEEN /HPF (ref 0–?)

## 2016-05-23 LAB — URINE CULTURE: ORGANISM ID, BACTERIA: NO GROWTH

## 2016-05-24 ENCOUNTER — Other Ambulatory Visit: Payer: Self-pay | Admitting: Family Medicine

## 2016-05-24 ENCOUNTER — Telehealth: Payer: Self-pay

## 2016-05-24 ENCOUNTER — Encounter: Payer: Self-pay | Admitting: Family Medicine

## 2016-05-24 DIAGNOSIS — R809 Proteinuria, unspecified: Secondary | ICD-10-CM

## 2016-05-24 NOTE — Telephone Encounter (Signed)
Called [facility] to confirm that they have received referral. It is currently under review with their providers to determine urgency and they will call him to schedule soon. Relayed this information to pt and he understood.

## 2016-05-26 ENCOUNTER — Other Ambulatory Visit: Payer: Self-pay | Admitting: Family Medicine

## 2016-05-28 ENCOUNTER — Other Ambulatory Visit: Payer: PPO | Admitting: Family Medicine

## 2016-05-28 DIAGNOSIS — R809 Proteinuria, unspecified: Secondary | ICD-10-CM

## 2016-05-28 NOTE — Progress Notes (Signed)
Here today for lab only visit. Has f/u appt to review results in 3d - 2/1.

## 2016-05-29 LAB — UPEP/UIFE/LIGHT CHAINS/TP, 24-HR UR
% BETA, Urine: 10 %
ALBUMIN, U: 73.2 %
ALPHA 1 URINE: 5.8 %
ALPHA-2-GLOBULIN, U: 5.3 %
Free Kappa Lt Chains,Ur: 11 mg/L (ref 1.35–24.19)
Free Lambda Lt Chains,Ur: 1.54 mg/L (ref 0.24–6.66)
GAMMA GLOBULIN URINE: 5.6 %
KAPPA/LAMBDA RATIO, U: 7.14 (ref 2.04–10.37)
PROTEIN 24H UR: 378 mg/(24.h) — AB (ref 30–150)
PROTEIN UR: 22.9 mg/dL

## 2016-05-29 LAB — COMPREHENSIVE METABOLIC PANEL
ALBUMIN: 4 g/dL (ref 3.5–5.5)
ALT: 15 IU/L (ref 0–44)
AST: 18 IU/L (ref 0–40)
Albumin/Globulin Ratio: 1.5 (ref 1.2–2.2)
Alkaline Phosphatase: 71 IU/L (ref 39–117)
BUN / CREAT RATIO: 6 — AB (ref 9–20)
BUN: 6 mg/dL (ref 6–24)
Bilirubin Total: 0.5 mg/dL (ref 0.0–1.2)
CALCIUM: 9.5 mg/dL (ref 8.7–10.2)
CO2: 23 mmol/L (ref 18–29)
CREATININE: 1.04 mg/dL (ref 0.76–1.27)
Chloride: 103 mmol/L (ref 96–106)
GFR calc non Af Amer: 80 mL/min/{1.73_m2} (ref >59)
GFR, EST AFRICAN AMERICAN: 92 mL/min/{1.73_m2} (ref >59)
GLUCOSE: 83 mg/dL (ref 65–99)
Globulin, Total: 2.7 g/dL (ref 1.5–4.5)
Potassium: 4.6 mmol/L (ref 3.5–5.2)
Sodium: 141 mmol/L (ref 134–144)
TOTAL PROTEIN: 6.7 g/dL (ref 6.0–8.5)

## 2016-05-29 LAB — CBC WITH DIFFERENTIAL/PLATELET
Basophils Absolute: 0 10*3/uL (ref 0.0–0.2)
Basos: 1 %
EOS (ABSOLUTE): 0 10*3/uL (ref 0.0–0.4)
EOS: 1 %
HEMATOCRIT: 42.1 % (ref 37.5–51.0)
HEMOGLOBIN: 14.8 g/dL (ref 13.0–17.7)
Immature Grans (Abs): 0 10*3/uL (ref 0.0–0.1)
Immature Granulocytes: 0 %
LYMPHS ABS: 1.5 10*3/uL (ref 0.7–3.1)
Lymphs: 26 %
MCH: 32.2 pg (ref 26.6–33.0)
MCHC: 35.2 g/dL (ref 31.5–35.7)
MCV: 92 fL (ref 79–97)
MONOCYTES: 8 %
Monocytes Absolute: 0.4 10*3/uL (ref 0.1–0.9)
NEUTROS ABS: 3.7 10*3/uL (ref 1.4–7.0)
Neutrophils: 64 %
Platelets: 272 10*3/uL (ref 150–379)
RBC: 4.6 x10E6/uL (ref 4.14–5.80)
RDW: 12.8 % (ref 12.3–15.4)
WBC: 5.7 10*3/uL (ref 3.4–10.8)

## 2016-05-29 LAB — CREATININE, URINE, 24 HOUR
Creatinine, 24H Ur: 1007 mg/24 hr (ref 1000–2000)
Creatinine, Urine: 61 mg/dL

## 2016-05-29 LAB — SEDIMENTATION RATE: Sed Rate: 12 mm/hr (ref 0–30)

## 2016-05-31 ENCOUNTER — Encounter: Payer: Self-pay | Admitting: Family Medicine

## 2016-05-31 ENCOUNTER — Ambulatory Visit (INDEPENDENT_AMBULATORY_CARE_PROVIDER_SITE_OTHER): Payer: PPO | Admitting: Family Medicine

## 2016-05-31 VITALS — BP 117/66 | HR 94 | Temp 97.8°F | Resp 16 | Ht 70.5 in | Wt 147.2 lb

## 2016-05-31 DIAGNOSIS — Z1212 Encounter for screening for malignant neoplasm of rectum: Secondary | ICD-10-CM | POA: Diagnosis not present

## 2016-05-31 DIAGNOSIS — R809 Proteinuria, unspecified: Secondary | ICD-10-CM | POA: Diagnosis not present

## 2016-05-31 DIAGNOSIS — Z1211 Encounter for screening for malignant neoplasm of colon: Secondary | ICD-10-CM

## 2016-05-31 DIAGNOSIS — Z23 Encounter for immunization: Secondary | ICD-10-CM | POA: Diagnosis not present

## 2016-05-31 DIAGNOSIS — R109 Unspecified abdominal pain: Secondary | ICD-10-CM | POA: Diagnosis not present

## 2016-05-31 DIAGNOSIS — R828 Abnormal findings on cytological and histological examination of urine: Secondary | ICD-10-CM | POA: Diagnosis not present

## 2016-05-31 DIAGNOSIS — R1032 Left lower quadrant pain: Secondary | ICD-10-CM

## 2016-05-31 LAB — POCT URINALYSIS DIP (MANUAL ENTRY)
Bilirubin, UA: NEGATIVE
Glucose, UA: NEGATIVE
Ketones, POC UA: NEGATIVE
Leukocytes, UA: NEGATIVE
Nitrite, UA: NEGATIVE
Protein Ur, POC: 100 — AB
Spec Grav, UA: 1.01
Urobilinogen, UA: 0.2
pH, UA: 7

## 2016-05-31 NOTE — Patient Instructions (Signed)
     IF you received an x-ray today, you will receive an invoice from [facility]. Please contact [facility] at [phone] with questions or concerns regarding your invoice.   IF you received labwork today, you will receive an invoice from [name]. Please contact [name] at [phone] with questions or concerns regarding your invoice.   Our billing staff will not be able to assist you with questions regarding bills from these companies.  You will be contacted with the lab results as soon as they are available. The fastest way to get your results is to activate your [name] account. Instructions are located on the last page of this paperwork. If you have not heard from us regarding the results in 2 weeks, please contact this office.     

## 2016-05-31 NOTE — Progress Notes (Signed)
FOLLOW

## 2016-05-31 NOTE — Progress Notes (Signed)
Subjective:    Patient ID: William [name], male    DOB: 08/04/1960, 56 y.o.   MRN: [id number]  HPI  William [name] is a 56 yo male here today to follow-up on new proteinuria and left flank pain.  He reports his left flank pain doesn't improve with heat and Flexeril and overall might be a little better than prior but is still present. It radiates around to the left lower quadrant of the abdomen though the abdominal pain is better than prior as well. He states that he had similar pain one year prior and is prior PCP had referred him to [facility] in physical therapy at the [facility]. He went once and that was very helpful but then was lost to follow-up when he becaome acutely ill with influenza and pneumonia.   Bowels and urine have been normal. He has been trying not to include high protein sources and to his diet. His appt is with Feb 9 Dr. Hollie Salk at [facility] His appointment with Dr. Diona Fanti is at 2/21 at 12/15.  Patient does not have an appointment scheduled with Dr. Paulita Fujita - due for colonoscopy as last was in 2007 - pt will call to schedule.  Past Medical History:  Diagnosis Date  . Allergy   . Anxiety   . Depression   . Gastroparesis   . Heart murmur   . Migraine headache   . Mitral valve prolapse    No past surgical history on file. Current Outpatient Prescriptions on File Prior to Visit  Medication Sig Dispense Refill  . ammonium lactate (AMLACTIN) 12 % lotion Apply 1 application topically 2 (two) times daily. 567 g 0  . cyclobenzaprine (FLEXERIL) 5 MG tablet Take 1 tablet (5 mg total) by mouth at bedtime. 30 tablet 1  . lidocaine (LIDODERM) 5 % Place 1 patch onto the skin daily. Remove & Discard patch within 12 hours or as directed by MD 30 patch 0  . urea (CARMOL) 40 % CREA Apply 1 application topically 2 (two) times daily. 227 each 3  . Vitamin D, Ergocalciferol, (DRISDOL) 50000 units CAPS capsule Take 1 capsule (50,000 Units total) by mouth every 7 (seven) days. 24 capsule 0   No  current facility-administered medications on file prior to visit.    Allergies  Allergen Reactions  . Erythromycin Nausea And Vomiting and Anaphylaxis  . Hydrocodone-Acetaminophen Nausea And Vomiting  . Tramadol Nausea And Vomiting  . Cephalexin Diarrhea and Nausea And Vomiting  . Cephalosporins Diarrhea and Nausea And Vomiting  . Compazine [Prochlorperazine Edisylate]     uncontrollable shake - tremor  . Ketoconazole Itching  . Ketorolac Nausea And Vomiting  . Ketorolac Tromethamine     REACTION: Reaction not known  . Latex Itching    REACTION: Reaction not known  . Nalbuphine Other (See Comments)    States loss of consciousness REACTION: Reaction not known  . Oxycodone-Acetaminophen Other (See Comments) and Swelling  . Prednisone Nausea And Vomiting  . Prochlorperazine Edisylate     REACTION: shakes/tremors  . Topamax [Topiramate] Diarrhea and Nausea And Vomiting  . Cefdinir Diarrhea, Nausea And Vomiting and Rash  . Neosporin [Neomycin-Bacitracin Zn-Polymyx] Rash  . Oxycodone-Acetaminophen Other (See Comments)   Family History  Problem Relation Age of Onset  . Hypertension Mother   . Hypertension Brother    Social History   Social History  . Marital status: Married    Spouse name: N/A  . Number of children: N/A  . Years of education: N/A  Social History Main Topics  . Smoking status: Never Smoker  . Smokeless tobacco: Never Used  . Alcohol use No  . Drug use: No  . Sexual activity: Yes    Partners: Female   Other Topics Concern  . None   Social History Narrative  . None   Depression screen [facility] 2/9 05/31/2016 05/21/2016 05/14/2016 05/10/2016 05/05/2016  Decreased Interest 0 0 0 0 0  Down, Depressed, Hopeless 0 0 0 0 0  PHQ - 2 Score 0 0 0 0 0    Review of Systems  Constitutional: Positive for activity change (decrease). Negative for appetite change, chills, fever and unexpected weight change.  Gastrointestinal: Positive for abdominal pain. Negative for  constipation, diarrhea and vomiting.  Genitourinary: Negative for decreased urine volume, difficulty urinating, frequency and urgency.  Musculoskeletal: Positive for back pain and myalgias. Negative for gait problem and joint swelling.  Skin: Negative for color change, rash and wound.  Allergic/Immunologic: Negative for immunocompromised state.  Neurological: Negative for dizziness, weakness, light-headedness and numbness.  Psychiatric/Behavioral: Negative for dysphoric mood. The patient is nervous/anxious.        Objective:   Physical Exam  Constitutional: He appears well-developed and well-nourished. No distress.  HENT:  Head: Normocephalic and atraumatic.  Neck: Normal range of motion. Neck supple. No thyromegaly present.  Cardiovascular: Normal rate, regular rhythm and normal heart sounds.   Pulmonary/Chest: Effort normal and breath sounds normal.  Abdominal: Soft. Normal appearance and bowel sounds are normal. He exhibits no distension and no mass. There is no hepatosplenomegaly. There is tenderness in the left upper quadrant. There is no rebound, no guarding and no CVA tenderness. No hernia.  Genitourinary: Rectum normal and prostate normal. Rectal exam shows no tenderness, anal tone normal and guaiac negative stool.  Lymphadenopathy:    He has no cervical adenopathy.  Skin: He is not diaphoretic.  Psychiatric: His speech is normal and behavior is normal. Judgment normal. His mood appears anxious.      BP 117/66 (BP Location: Right Arm, Patient Position: Sitting, Cuff Size: Normal)   Pulse 94   Temp 97.8 F (36.6 C) (Oral)   Resp 16   Ht 5' 10.5" (1.791 m)   Wt 147 lb 3.2 oz (66.8 kg)   SpO2 98%   BMI 20.82 kg/m    Assessment & Plan:   1. Proteinuria, unspecified type - repeat 24 hrs urine was better than prior.  He has an appt with [facility] sched for 1 wks - 2/9 for further eval. Other than the mild proteinuria, the only other abnormality that has been found were a note of  atypical urothelial cells on cytology last wk. I spoke with the pathologist who stated that they were not atypical enough to indicate any specific diagnosis but it is poss that he was developing a flat bladder or ureter cancer as sometimes these are not visible on imaging but can be seen with uroscopy. Pt has initial urology appt with Dr. Diona Fanti sched 2/21 but he is incredibly anxious so would like to establish with [facility] instead if he can be worked in sooner.  Will repeat cytology as if anything is developing perhaps the cellular appearance maybe more diagnostic now.  2. Abdominal pain, left lower quadrant   3. Acute left flank pain - suspect MSK, cont tylenol, flexeril, heat. Refer back to [facility] which has been effective for him once prior for this same issue  4. Need for prophylactic vaccination and inoculation against influenza -  done today, reminded pt he will not be covered for sev wks  5. Screening for colorectal cancer - reassured that normal abd/pelvis CT scan but still is due for screening colonoscopy as last done in 2007 and I don't think he should defer this considering his persistent LLQ abd pain - referral placed to Dr. Paulita Fujita who did pt's last colonoscopy   F/u prn  Orders Placed This Encounter  Procedures  . Flu Vaccine QUAD 36+ mos IM  . Microalbumin/Creatinine Ratio, Urine  . Ambulatory referral to Gastroenterology    Referral Priority:   Routine    Referral Type:   Consultation    Referral Reason:   Specialty Services Required    Referred to Provider:   Arta Silence, MD    Number of Visits Requested:   1  . Ambulatory referral to Physical Therapy    Referral Priority:   Routine    Referral Type:   Physical Medicine    Referral Reason:   Specialty Services Required    Requested Specialty:   Physical Therapy    Number of Visits Requested:   1  . Care order/instruction:    Scheduling Instructions:     Complete orders, AVS and go.  [name] POCT urinalysis  dipstick    [name], M.D.  [facility] [address] [place], [place] [address] [phone] phone [phone] fax  05/31/16 8:54 AM

## 2016-06-01 LAB — CYTOLOGY, URINE

## 2016-06-01 LAB — MICROALBUMIN / CREATININE URINE RATIO
Creatinine, Urine: 73.7 mg/dL
Microalb/Creat Ratio: 380.1 mg/g creat — ABNORMAL HIGH (ref 0.0–30.0)
Microalbumin, Urine: 280.1 ug/mL

## 2016-06-04 ENCOUNTER — Ambulatory Visit: Payer: PPO | Admitting: Family Medicine

## 2016-06-04 DIAGNOSIS — N451 Epididymitis: Secondary | ICD-10-CM | POA: Diagnosis not present

## 2016-06-04 DIAGNOSIS — N509 Disorder of male genital organs, unspecified: Secondary | ICD-10-CM | POA: Diagnosis not present

## 2016-06-05 DIAGNOSIS — N451 Epididymitis: Secondary | ICD-10-CM | POA: Insufficient documentation

## 2016-06-08 DIAGNOSIS — N182 Chronic kidney disease, stage 2 (mild): Secondary | ICD-10-CM | POA: Diagnosis not present

## 2016-06-08 DIAGNOSIS — R809 Proteinuria, unspecified: Secondary | ICD-10-CM | POA: Diagnosis not present

## 2016-06-11 ENCOUNTER — Ambulatory Visit: Payer: PPO | Admitting: Family Medicine

## 2016-06-12 ENCOUNTER — Telehealth: Payer: Self-pay

## 2016-06-12 NOTE — Telephone Encounter (Signed)
Pt saw dr Felipa Eth last Monday and this Friday at 10am  Pain is increased in scrotum, abdomen and back/hip.

## 2016-06-12 NOTE — Telephone Encounter (Signed)
William [name] -  Still the same non-specific result. I have faxed these results over to Dr. Felipa Eth and will give him a call as from his last consult note it doesn't look like he is aware of these atypical cells. On your next follow-up visit with him, he should have the details to get his opinion on how to further evaluate this. Dr. Brigitte Pulse

## 2016-06-12 NOTE — Telephone Encounter (Signed)
Pt requesting a call back from Dr. Brigitte Pulse about the abnormal cells in labs and referrals.  Best contact for pt [phone].

## 2016-06-12 NOTE — Telephone Encounter (Signed)
Called pt and LVM.  He has been referred to GI (Dr. Paulita Fujita) and PT ([facility]) for this and I was wondering if he had appts yet.  Cont tylenol and flexeril. Would hold off on any additional medications at this time as I don't want to contribute to confusion with unknown diagnosis by adding in potential med side effects.  Will call Dr. Felipa Eth now to make sure he is aware of the atypical urothelial cells seen on urine. Looks like he had an appt with nephrology at [facility] 2/9 so am interested to get their c/s note and see what their opinion was.  I"m happy to see Doristine Devoid 2/15 or 2/17 if he would like.

## 2016-06-12 NOTE — Telephone Encounter (Signed)
Pt called back about message Dr Brigitte Pulse pt states that he had already went to go see someone about his kidney urologist and his next two appointments for GI Doc and PT is set for a later dater but he has made an appointment with you on Saturday

## 2016-06-15 DIAGNOSIS — N451 Epididymitis: Secondary | ICD-10-CM | POA: Diagnosis not present

## 2016-06-16 ENCOUNTER — Ambulatory Visit (INDEPENDENT_AMBULATORY_CARE_PROVIDER_SITE_OTHER): Payer: PPO | Admitting: Family Medicine

## 2016-06-16 VITALS — BP 122/80 | HR 100 | Temp 98.3°F | Resp 18 | Ht 70.5 in | Wt 147.2 lb

## 2016-06-16 DIAGNOSIS — L853 Xerosis cutis: Secondary | ICD-10-CM

## 2016-06-16 DIAGNOSIS — R1032 Left lower quadrant pain: Secondary | ICD-10-CM

## 2016-06-16 DIAGNOSIS — R800 Isolated proteinuria: Secondary | ICD-10-CM | POA: Diagnosis not present

## 2016-06-16 DIAGNOSIS — N50819 Testicular pain, unspecified: Secondary | ICD-10-CM

## 2016-06-16 LAB — POCT URINALYSIS DIP (MANUAL ENTRY)
BILIRUBIN UA: NEGATIVE
Glucose, UA: NEGATIVE
Ketones, POC UA: NEGATIVE
LEUKOCYTES UA: NEGATIVE
NITRITE UA: NEGATIVE
PH UA: 7
Spec Grav, UA: 1.02
UROBILINOGEN UA: 0.2

## 2016-06-16 LAB — POC MICROSCOPIC URINALYSIS (UMFC): Mucus: ABSENT

## 2016-06-16 NOTE — Progress Notes (Signed)
By signing my name below, I, William [name], attest that this documentation has been prepared under the direction and in the presence of William Cheadle, MD.  Electronically Signed: Verlee [name], Medical Scribe. 06/16/16. 8:18 AM.  Subjective:    Patient ID: William [name], male    DOB: 07/23/1960, 56 y.o.   MRN: [id number]  HPI Chief Complaint  Patient presents with  . Follow-up  . urine issues    states he is here for a follow up on the high protein in his urine    HPI Comments: William [name] is a 56 y.o. male who presents to the [facility] for follow-up. Pt recently went to Dr. Felipa Eth and his nephrologist Dr. Hollie Salk at [facility]. Pt states levaquin (rx 500 mg QD for 2 weeks) didn't relieve him of his intermittent testicular burning occasionally with mild itching, occurring a couple of times a week lasting 30 mins without any other acute sxs. Pt missed work Monday (5 days ago) due to nausea, and diarrhea. 3 days ago Wednesday, he felt "a peak of pain" in his LLQ/flank, scrotum, and back so he scheduled an appt with Dr. Felipa Eth. Pt notes the LLQ/left flank pain is similar to the time he had fecal impaction in the same location and was rx amitiza for relief of his sxs -  sxs have completely resolved since and he has regular bm. Pt also reports dry skin and states he only lotions his hands. Pt saw Dr. Felipa Eth yesterday and had a urine analysis without protein in his urine. He drank a bottle of water before his appt, and was so hydrated he was urinating water. Pt was told he has a leaky kidney, and it leaks at a higher rate - it's not deteriorating and isn't cancerous. When pt went to his dermatologist, Dr. Ubaldo Glassing in the past, he was told his itchy feet could have been from a pinched nerve - suspects his itchy testes could be from a pinched nerve. Pt was told lisinopril 5 mg QD, could also manage his proteinuria. Pt stopped lisinopril 3 days ago because he  kept experiencing light-headedness and dizziness. Pt eats chick-fil-a, and in the morning he eats cheese toast, grits in the morning, during the day he snacks on unsalted cashews. Pt's migraines have reduced significantly. Pt denies abdominal surgeries. Denies constipation, skin changes, and emesis.  Doxycycline upsets his stomach.  Patient Active Problem List   Diagnosis Date Noted  . GERD (gastroesophageal reflux disease) 10/13/2015  . Gastroparesis 10/13/2015  . Intractable chronic migraine without aura and without status migrainosus 12/15/2013  . Hyperlipidemia 04/04/2012  . MITRAL VALVE PROLAPSE 07/15/2009  . IRRITABLE BOWEL SYNDROME 07/15/2009   Past Medical History:  Diagnosis Date  . Allergy   . Anxiety   . Depression   . Gastroparesis   . Heart murmur   . Migraine headache   . Mitral valve prolapse    History reviewed. No pertinent surgical history. Allergies  Allergen Reactions  . Erythromycin Nausea And Vomiting and Anaphylaxis  . Hydrocodone-Acetaminophen Nausea And Vomiting  . Tramadol Nausea And Vomiting  . Cephalexin Diarrhea and Nausea And Vomiting  . Cephalosporins Diarrhea and Nausea And Vomiting  . Compazine [Prochlorperazine Edisylate]     uncontrollable shake - tremor  . Ketoconazole Itching  . Ketorolac Nausea And Vomiting  . Ketorolac Tromethamine     REACTION: Reaction not known  . Latex Itching    REACTION: Reaction not known  . Nalbuphine Other (  See Comments)    States loss of consciousness REACTION: Reaction not known  . Oxycodone-Acetaminophen Other (See Comments) and Swelling  . Prednisone Nausea And Vomiting  . Prochlorperazine Edisylate     REACTION: shakes/tremors  . Topamax [Topiramate] Diarrhea and Nausea And Vomiting  . Cefdinir Diarrhea, Nausea And Vomiting and Rash  . Neosporin [Neomycin-Bacitracin Zn-Polymyx] Rash  . Oxycodone-Acetaminophen Other (See Comments)   Prior to Admission medications   Medication Sig Start Date End  Date Taking? Authorizing Provider  ammonium lactate (AMLACTIN) 12 % lotion Apply 1 application topically 2 (two) times daily. 05/05/16  Yes Shawnee Knapp, MD  cyclobenzaprine (FLEXERIL) 5 MG tablet Take 1 tablet (5 mg total) by mouth at bedtime. 05/05/16  Yes Shawnee Knapp, MD  levofloxacin (LEVAQUIN) 500 MG tablet Take 500 mg by mouth daily.   Yes Historical Provider, MD  lidocaine (LIDODERM) 5 % Place 1 patch onto the skin daily. Remove & Discard patch within 12 hours or as directed by MD 02/08/14  Yes [name], MD  lisinopril (PRINIVIL,ZESTRIL) 5 MG tablet Take 5 mg by mouth daily.   Yes Historical Provider, MD  urea (CARMOL) 40 % CREA Apply 1 application topically 2 (two) times daily. 05/05/16  Yes Shawnee Knapp, MD  Vitamin D, Ergocalciferol, (DRISDOL) 50000 units CAPS capsule Take 1 capsule (50,000 Units total) by mouth every 7 (seven) days. 05/05/16  Yes Shawnee Knapp, MD   Social History   Social History  . Marital status: Married    Spouse name: N/A  . Number of children: N/A  . Years of education: N/A   Occupational History  . Not on file.   Social History Main Topics  . Smoking status: Never Smoker  . Smokeless tobacco: Never Used  . Alcohol use No  . Drug use: No  . Sexual activity: Yes    Partners: Female   Other Topics Concern  . Not on file   Social History Narrative  . No narrative on file   Review of Systems  Gastrointestinal: Positive for abdominal pain, diarrhea and nausea. Negative for constipation and vomiting.  Genitourinary: Positive for testicular pain.  Skin: Negative for color change, pallor and rash.  Neurological: Positive for dizziness and light-headedness.   Objective:  Physical Exam  Constitutional: He appears well-developed and well-nourished. No distress.  HENT:  Head: Normocephalic and atraumatic.  Eyes: Conjunctivae are normal.  Neck: Neck supple. No thyroid mass and no thyromegaly present.  Cardiovascular: Normal rate, regular rhythm and normal  heart sounds.  Exam reveals no friction rub.   No murmur heard. Pulmonary/Chest: Effort normal and breath sounds normal. No respiratory distress. He has no wheezes. He has no rales.  Abdominal: Soft. Bowel sounds are normal. He exhibits no mass. There is no hepatosplenomegaly. There is tenderness in the left lower quadrant. There is no rebound, no guarding and no CVA tenderness.  Small umbilical hernia  Neurological: He is alert.  Skin: Skin is warm and dry.  Psychiatric: He has a normal mood and affect. His behavior is normal.  Nursing note and vitals reviewed.  BP 122/80   Pulse 100   Temp 98.3 F (36.8 C) (Oral)   Resp 18   Ht 5' 10.5" (1.791 m)   Wt 147 lb 3.2 oz (66.8 kg)   SpO2 99%   BMI 20.82 kg/m    Results for orders placed or performed in visit on 06/16/16  POCT urinalysis dipstick  Result Value Ref Range   Color,  UA yellow yellow   Clarity, UA clear clear   Glucose, UA negative negative   Bilirubin, UA negative negative   Ketones, POC UA negative negative   Spec Grav, UA 1.020    Blood, UA trace-lysed (A) negative   pH, UA 7.0    Protein Ur, POC >=300 (A) negative   Urobilinogen, UA 0.2    Nitrite, UA Negative Negative   Leukocytes, UA Negative Negative  POCT Microscopic Urinalysis ([facility])  Result Value Ref Range   WBC,UR,HPF,POC None None WBC/hpf   RBC,UR,HPF,POC Few (A) None RBC/hpf   Bacteria None None, Too numerous to count   Mucus Absent Absent   Epithelial Cells, UR Per Microscopy None None, Too numerous to count cells/hpf   Assessment & Plan:   1. Isolated proteinuria without specific morphologic lesion - persists, saw nephrology who rec pt start acei but he couldn't tolerate due to hypotension.  Advised pt to try very low dose of 2.5-5mg  daily and then recheck urine while on it.   2. Testicular pain - was put on doxycycline which he couldn't tolerate than switched to levaquin per Dr. Felipa Eth, urology. No improvement in sxs. Pt has f/u appt with Dr.  Felipa Eth next mo but will make appt with [facility] urology for second opinion in 6 mos or so in case it is needed. Discussed possibility for pinched nerve as etiology of intermittent burning pain and so next step would be pelvic floor PT with [facility] which pt is very hesitant about but will consider. Ebony Hail, his wife, is with him today and was very supportive of this.  3. Dry skin dermatitis - cont to use top cream with urea (or ammonia lactate) more diffusely - hands had improved very well  4. Abdominal pain, left lower quadrant - had several yr prior which improved with bowel regimen and PT. Did do successful miralax cleanout with minimal improvement in sxs followed by CT w/o abnml. Has PT appt with [facility] sched. Rec making an appt with GI - Dr. Paulita Fujita for further eval. I do wonder whether pt could have an atypical form of IBS-C.    Orders Placed This Encounter  Procedures  . Microalbumin/Creatinine Ratio, Urine  . POCT urinalysis dipstick  . POCT Microscopic Urinalysis ([facility])    Meds ordered this encounter  Medications  . lisinopril (PRINIVIL,ZESTRIL) 5 MG tablet    Sig: Take 5 mg by mouth daily.  [name] levofloxacin (LEVAQUIN) 500 MG tablet    Sig: Take 500 mg by mouth daily.   Over 40 min spent in face-to-face evaluation of and consultation with patient and coordination of care.  Over 50% of this time was spent counseling this patient.  I personally performed the services described in this documentation, which was scribed in my presence. The recorded information has been reviewed and considered, and addended by me as needed.   William [name], M.D.  Primary Care at [facility] [address] [place], [place] [address] [phone] phone [phone] fax  06/27/16 2:47 PM

## 2016-06-16 NOTE — Progress Notes (Signed)
n

## 2016-06-16 NOTE — Patient Instructions (Addendum)
IF you received an x-ray today, you will receive an invoice from [facility]. Please contact [facility] at [phone] with questions or concerns regarding your invoice.   IF you received labwork today, you will receive an invoice from [facility]. Please contact [name] at [phone] with questions or concerns regarding your invoice.   Our billing staff will not be able to assist you with questions regarding bills from these companies.  You will be contacted with the lab results as soon as they are available. The fastest way to get your results is to activate your [name] account. Instructions are located on the last page of this paperwork. If you have not heard from [place] regarding the results in 2 weeks, please contact this office.     Pelvic Floor Rehabilitation with [facility] might be a next step if the pain continues in another month. Restart the lisinopril - any orders that Dr. Hollie Salk wants would be great to have a copy of - please ask [name] to always send them to me as well since we use [name] as well.   Restart half tab of the lisinopril a day. If you have any incident where you again feel lightheaded or dizzy make sure you lay flat drink a lot of water or a Gatorade or other sports drink and he eats something salty and with protein such as nuts or deli meat to increase her blood pressure back up again and you should feel better. Let's recheck your blood tests and your urine protein in 2 weeks after being on the lisinopril. You can make an appointment with me for this if you would like. I will make sure to continue communicating everything with Dr. Hollie Salk. If you do have an episode of lightheadedness on half tab of the lisinopril then try cutting it into a third of the tab - however low you need to go is fine but it would be good to find some microscopic dose that you can stay on in order to see if the leaky kidneys respond.   Focal  Neuropathy Introduction Focal neuropathy is a nerve injury that affects one area of the body, such as an arm, a leg, or the face. The injury may involve one nerve or a small group of nerves. Examples of focal neuropathy include brachial plexus injury and [place]-Turner syndrome. What are the causes? This condition may be caused by:  A sudden cut or stretch. This can happen because of an accident or during surgery.  A small injury that happens again and again.  A compression injury. This kind of injury happens when a blood vessel, a tumor, or something else presses on a nerve for a long time.  Entrapment. This can happen to a nerve that has to pass through a narrow area.  Lack of blood to the nerves. This can be caused by certain medical conditions, like diabetes or vasculitis.  Extreme cold.  Severe burns.  Radiation. What are the signs or symptoms? Symptoms of this condition depend on where the damaged nerve is and what kind of nerve it is. For example, damage to nerves that carry signals away from the brain can cause symptoms related to movement, but damage to nerves that carry signals to the brain can cause symptoms related to feeling and pain. Symptoms affect only one area of the body. Common symptoms include:  Numbness.  Tingling.  A burning pain.  A prickling feeling.  Very sensitive skin.  Weakness.  Paralysis.  Muscle twitching.  Muscles getting smaller (muscle wasting).  Poor coordination. How is this diagnosed? This condition may be diagnosed with:  A neurologic exam. During the exam, your health care provider will check your reflexes, how you move, and what you can feel.  Tests. Tests may be done to help find the area that has been damaged. Tests may include:  Imaging tests, such as a CT scan or MRI.  Electromyogram (EMG). This test checks the nerves that control your muscles.  Nerve conduction velocity (NCV) tests. These tests check how quickly  messages pass through your nerves. How is this treated? Treatment for this condition depends on what damaged the nerve and how much of the nerve is damaged. Treatment may involve:  Surgery to ease pressure on a nerve. This may be done if you have a compression injury or entrapment.  Medicines, such as:  Medicines for pain, such painkillers, certain anti-seizure medicines, or antidepressants.  Steroid medicines. These may be given in pill form or with a shot (injection).  Physical therapy (PT) to help movement.  Splints or other devices to help movement. Follow these instructions at home:   Take over-the-counter and prescription medicines only as told by your health care provider.  If you were given a splint or other device to help with movement, use it as told by your health care provider.  If any part of your body is numb, check it every day for signs of injury or infection. Watch for:  Redness, swelling, or pain.  Fluid or blood.  Warmth.  Pus or a bad smell.  Do not do things that put pressure on your damaged nerve. You may have to avoid certain activities or avoid sitting or lying in a certain way.  Do not smoke. Smoking keeps blood from getting to damaged nerves. If you need help quitting, ask your health care provider.  Limit alcohol intake, or avoid alcohol completely. Too much alcohol can cause a lack of B vitamins, which are needed for healthy nerves.  Have a good support system. Coping with focal neuropathy can be stressful. Talk with a mental health caregiver or join a support group if you are struggling.  Keep all follow-up visits as told by your health care provider. This is important. These include PT visits. Contact a health care provider if:  You think you have an injury or infection.  You have new symptoms.  You are struggling emotionally from dealing with your condition. Get help right away if:  You have severe pain that comes on suddenly.  You  develop any paralysis.  You lose feeling in any part of your body. This information is not intended to replace advice given to you by your health care provider. Make sure you discuss any questions you have with your health care provider. Document Released: 03/28/2015 Document Revised: 09/22/2015 Document Reviewed: 11/12/2014  2017 [employer]

## 2016-06-17 LAB — MICROALBUMIN / CREATININE URINE RATIO
CREATININE, UR: 210.6 mg/dL
Microalb/Creat Ratio: 431.7 mg/g creat — ABNORMAL HIGH (ref 0.0–30.0)
Microalbumin, Urine: 909.1 ug/mL

## 2016-06-18 ENCOUNTER — Ambulatory Visit: Payer: Self-pay | Admitting: Family Medicine

## 2016-06-27 DIAGNOSIS — R800 Isolated proteinuria: Secondary | ICD-10-CM | POA: Insufficient documentation

## 2016-06-27 DIAGNOSIS — R109 Unspecified abdominal pain: Secondary | ICD-10-CM | POA: Insufficient documentation

## 2016-06-27 DIAGNOSIS — N50812 Left testicular pain: Secondary | ICD-10-CM | POA: Insufficient documentation

## 2016-06-27 DIAGNOSIS — L853 Xerosis cutis: Secondary | ICD-10-CM | POA: Insufficient documentation

## 2016-06-27 NOTE — Progress Notes (Signed)
Subjective:    Patient ID: William [name], male    DOB: [birth year], 56 y.o.   MRN: [id number] Chief Complaint  Patient presents with  . Follow-up    urine  . Cough  . Sore Throat    HPI  William [name] is a delightful 56 yo male who presents with acute illness x 1 wk. He is having night sweats and chills. He c/o severe rhinitis, frontal sinus pressure, HAs, very itchy eyes, cough with chest pain.  He has been taking acetaminophen, eye gtts of unknown type, diphenhydramine, and nasal saline spray w/o success.  He reports he has responded well to albuterol inhaler, nasal ipratropium (which he has at home), olatadine gtts, and promethazine-DM cough syrup prior but does not have rxs for these at home now.  He c/o constant chest burning that gets worse when he leans forward, does not change with food, and little worse   He stopped the levofloxacin and lisinopril as it made him ill - dizzy, nauseated. Has f/u appt with Dr. Felipa Eth, urology, next wk.  He had to cancel his initial PT appt due to his acute illness.   Abdominal pain has been a little better, + nausea, no vomiting. Flank pain decreased, bowels normal. He did get his flu shot this year.  His wife has been ill with sinusitis.  Past Medical History:  Diagnosis Date  . Allergy   . Anxiety   . Depression   . Gastroparesis   . Heart murmur   . Migraine headache   . Mitral valve prolapse    History reviewed. No pertinent surgical history. Current Outpatient Prescriptions on File Prior to Visit  Medication Sig Dispense Refill  . ammonium lactate (AMLACTIN) 12 % lotion Apply 1 application topically 2 (two) times daily. 567 g 0  . cyclobenzaprine (FLEXERIL) 5 MG tablet Take 1 tablet (5 mg total) by mouth at bedtime. 30 tablet 1  . lidocaine (LIDODERM) 5 % Place 1 patch onto the skin daily. Remove & Discard patch within 12 hours or as directed by MD 30 patch 0  . lisinopril (PRINIVIL,ZESTRIL) 5 MG tablet Take 5 mg by mouth daily.      . urea (CARMOL) 40 % CREA Apply 1 application topically 2 (two) times daily. 227 each 3  . Vitamin D, Ergocalciferol, (DRISDOL) 50000 units CAPS capsule Take 1 capsule (50,000 Units total) by mouth every 7 (seven) days. 24 capsule 0   No current facility-administered medications on file prior to visit.    Allergies  Allergen Reactions  . Erythromycin Nausea And Vomiting and Anaphylaxis  . Hydrocodone-Acetaminophen Nausea And Vomiting  . Tramadol Nausea And Vomiting  . Cephalexin Diarrhea and Nausea And Vomiting  . Cephalosporins Diarrhea and Nausea And Vomiting  . Compazine [Prochlorperazine Edisylate]     uncontrollable shake - tremor  . Ketoconazole Itching  . Ketorolac Nausea And Vomiting  . Ketorolac Tromethamine     REACTION: Reaction not known  . Latex Itching    REACTION: Reaction not known  . Nalbuphine Other (See Comments)    States loss of consciousness REACTION: Reaction not known  . Oxycodone-Acetaminophen Other (See Comments) and Swelling  . Prednisone Nausea And Vomiting  . Prochlorperazine Edisylate     REACTION: shakes/tremors  . Topamax [Topiramate] Diarrhea and Nausea And Vomiting  . Cefdinir Diarrhea, Nausea And Vomiting and Rash  . Neosporin [Neomycin-Bacitracin Zn-Polymyx] Rash  . Oxycodone-Acetaminophen Other (See Comments)   Family History  Problem Relation Age of Onset  .  Hypertension Mother   . Hypertension Brother    Social History   Social History  . Marital status: Married    Spouse name: N/A  . Number of children: N/A  . Years of education: N/A   Social History Main Topics  . Smoking status: Never Smoker  . Smokeless tobacco: Never Used  . Alcohol use No  . Drug use: No  . Sexual activity: Yes    Partners: Female   Other Topics Concern  . None   Social History Narrative  . None   Depression screen [facility] 2/9 06/28/2016 06/16/2016 05/31/2016 05/21/2016 05/14/2016  Decreased Interest 0 0 0 0 0  Down, Depressed, Hopeless 0 0 0 0 0  PHQ -  2 Score 0 0 0 0 0   Review of Systems  Constitutional: Positive for activity change, appetite change, chills, diaphoresis, fatigue and fever. Negative for unexpected weight change.  HENT: Positive for congestion, postnasal drip, rhinorrhea, sinus pain, sinus pressure and sore throat. Negative for trouble swallowing and voice change.   Respiratory: Positive for cough. Negative for chest tightness, shortness of breath and wheezing.   Cardiovascular: Positive for chest pain. Negative for palpitations and leg swelling.  Gastrointestinal: Positive for nausea. Negative for abdominal pain, constipation, diarrhea and vomiting.  Genitourinary: Positive for decreased urine volume and testicular pain. Negative for flank pain and urgency.  Allergic/Immunologic: Negative for immunocompromised state.  Neurological: Positive for weakness and headaches.  Psychiatric/Behavioral: Positive for sleep disturbance.       Objective:   Physical Exam  Constitutional: He is oriented to person, place, and time. He appears well-developed and well-nourished. He appears ill. No distress.  HENT:  Head: Normocephalic and atraumatic.  Right Ear: Tympanic membrane, external ear and ear canal normal.  Left Ear: Tympanic membrane, external ear and ear canal normal.  Nose: Rhinorrhea present. Right sinus exhibits frontal sinus tenderness. Left sinus exhibits frontal sinus tenderness.  Mouth/Throat: Uvula is midline and mucous membranes are normal. Posterior oropharyngeal erythema present. No oropharyngeal exudate, posterior oropharyngeal edema or tonsillar abscesses.  Eyes: Conjunctivae are normal. No scleral icterus.  Neck: Normal range of motion. Neck supple. No thyromegaly present.  Cardiovascular: Normal rate, regular rhythm, normal heart sounds and intact distal pulses.   Pulmonary/Chest: Effort normal and breath sounds normal. No respiratory distress.  Abdominal: Soft. Normal appearance and bowel sounds are normal.  There is CVA tenderness.  Musculoskeletal: He exhibits no edema.  Lymphadenopathy:       Head (right side): No submandibular and no tonsillar adenopathy present.       Head (left side): No submandibular and no tonsillar adenopathy present.    He has cervical adenopathy.       Right cervical: Superficial cervical adenopathy present.       Right: No supraclavicular adenopathy present.       Left: No supraclavicular adenopathy present.  Neurological: He is alert and oriented to person, place, and time.  Skin: Skin is warm and dry. He is not diaphoretic. No erythema.  Psychiatric: His speech is normal and behavior is normal. His mood appears anxious.    BP 108/67 (BP Location: Right Arm, Patient Position: Sitting, Cuff Size: Small)   Pulse (!) 113   Temp 98.1 F (36.7 C) (Oral)   Resp 18   Ht 5' 10.5" (1.791 m)   Wt 145 lb (65.8 kg)   SpO2 97%   BMI 20.51 kg/m      Assessment & Plan:  Start the ipratroptium nasal spray  he has at home. Over 40 min spent in face-to-face evaluation of and consultation with patient and coordination of care.  Over 50% of this time was spent counseling this patient. 1. Irritable bowel syndrome with constipation   2. Isolated proteinuria without specific morphologic lesion   3. Testicular pain   4. Dry skin dermatitis   5. Abdominal pain, left lower quadrant   6. Acute left flank pain   7. Fever, unspecified fever cause   8. Chest pain, unspecified type   9. Dehydration     Orders Placed This Encounter  Procedures  . Basic metabolic panel    Order Specific Question:   Has the patient fasted?    Answer:   No  . POCT urinalysis dipstick  . POCT CBC  . POCT Influenza A/B    Meds ordered this encounter  Medications  . gi cocktail (Maalox,Lidocaine,Donnatal)  . ondansetron (ZOFRAN-ODT) disintegrating tablet 4 mg  . promethazine-dextromethorphan (PROMETHAZINE-DM) 6.25-15 MG/5ML syrup    Sig: Take 5 mLs by mouth 4 (four) times daily as needed for  cough.    Dispense:  180 mL    Refill:  0  . olopatadine (PATANOL) 0.1 % ophthalmic solution    Sig: Place 1 drop into both eyes 2 (two) times daily.    Dispense:  5 mL    Refill:  12  . cetirizine (ZYRTEC) 10 MG tablet    Sig: Take 1 tablet (10 mg total) by mouth at bedtime.    Dispense:  30 tablet    Refill:  11    [name], M.D.  Primary Care at [facility] [address] [place], [place] [address] [phone] phone [phone] fax  07/27/16 3:48 AM

## 2016-06-28 ENCOUNTER — Ambulatory Visit (INDEPENDENT_AMBULATORY_CARE_PROVIDER_SITE_OTHER): Payer: PPO | Admitting: Family Medicine

## 2016-06-28 ENCOUNTER — Encounter: Payer: Self-pay | Admitting: Family Medicine

## 2016-06-28 VITALS — BP 108/67 | HR 113 | Temp 98.1°F | Resp 18 | Ht 70.5 in | Wt 145.0 lb

## 2016-06-28 DIAGNOSIS — K581 Irritable bowel syndrome with constipation: Secondary | ICD-10-CM | POA: Diagnosis not present

## 2016-06-28 DIAGNOSIS — R509 Fever, unspecified: Secondary | ICD-10-CM | POA: Diagnosis not present

## 2016-06-28 DIAGNOSIS — R800 Isolated proteinuria: Secondary | ICD-10-CM

## 2016-06-28 DIAGNOSIS — R079 Chest pain, unspecified: Secondary | ICD-10-CM

## 2016-06-28 DIAGNOSIS — L853 Xerosis cutis: Secondary | ICD-10-CM | POA: Diagnosis not present

## 2016-06-28 DIAGNOSIS — R1032 Left lower quadrant pain: Secondary | ICD-10-CM

## 2016-06-28 DIAGNOSIS — R109 Unspecified abdominal pain: Secondary | ICD-10-CM

## 2016-06-28 DIAGNOSIS — N50819 Testicular pain, unspecified: Secondary | ICD-10-CM

## 2016-06-28 DIAGNOSIS — E860 Dehydration: Secondary | ICD-10-CM | POA: Diagnosis not present

## 2016-06-28 LAB — POCT CBC
GRANULOCYTE PERCENT: 76.8 % (ref 37–80)
HCT, POC: 46 % (ref 43.5–53.7)
HEMOGLOBIN: 16.1 g/dL (ref 14.1–18.1)
Lymph, poc: 1.6 (ref 0.6–3.4)
MCH: 32.3 pg — AB (ref 27–31.2)
MCHC: 34.9 g/dL (ref 31.8–35.4)
MCV: 92.5 fL (ref 80–97)
MID (cbc): 0.2 (ref 0–0.9)
MPV: 7.9 fL (ref 0–99.8)
PLATELET COUNT, POC: 262 10*3/uL (ref 142–424)
POC Granulocyte: 5.9 (ref 2–6.9)
POC LYMPH PERCENT: 20.8 %L (ref 10–50)
POC MID %: 2.4 %M (ref 0–12)
RBC: 4.98 M/uL (ref 4.69–6.13)
RDW, POC: 12.7 %
WBC: 7.7 10*3/uL (ref 4.6–10.2)

## 2016-06-28 LAB — POCT INFLUENZA A/B
INFLUENZA B, POC: NEGATIVE
Influenza A, POC: NEGATIVE

## 2016-06-28 LAB — POCT URINALYSIS DIP (MANUAL ENTRY)
Glucose, UA: NEGATIVE
Leukocytes, UA: NEGATIVE
NITRITE UA: NEGATIVE
PH UA: 6
Spec Grav, UA: 1.025
UROBILINOGEN UA: 0.2

## 2016-06-28 MED ORDER — GI COCKTAIL ~~LOC~~
30.0000 mL | Freq: Once | ORAL | Status: AC
  Administered 2016-06-28: 30 mL via ORAL

## 2016-06-28 MED ORDER — CETIRIZINE HCL 10 MG PO TABS: 10.0000 mg | ORAL_TABLET | Freq: Every day | ORAL | 11 refills | Status: DC

## 2016-06-28 MED ORDER — OLOPATADINE HCL 0.1 % OP SOLN: 1.0000 [drp] | Freq: Two times a day (BID) | OPHTHALMIC | 12 refills | Status: DC

## 2016-06-28 MED ORDER — PROMETHAZINE-DM 6.25-15 MG/5ML PO SYRP: 5.0000 mL | ORAL_SOLUTION | Freq: Four times a day (QID) | ORAL | 0 refills | Status: DC | PRN

## 2016-06-28 MED ORDER — ONDANSETRON 4 MG PO TBDP
4.0000 mg | ORAL_TABLET | Freq: Once | ORAL | Status: AC
  Administered 2016-06-28: 4 mg via ORAL

## 2016-06-28 NOTE — Patient Instructions (Addendum)
You are very dehydrated which is causing low blood pressure, high heart rate, and blood in your urine.  I would like you to drink as many fluids as possible - try to get at least 100 oz of water in a day at least. If you want some of this to be an electrolyte replacement sports drink that is fine. You very well might have had the flu or a very similar respiratory viral pattern.  I recommend frequent warm salt water gargles, hot tea with honey and lemon, rest, and handwashing.  Hot showers or breathing in steam may help loosen the congestion.  Try a netti pot or sinus rinse is also likely to help you feel better and keep this from progressing.  Use the ipratropium nasal spray as well.  Recheck with me on Saturday. Keep tylenol on 2 tabs every 6 hours.    IF you received an x-ray today, you will receive an invoice from [facility]. Please contact [facility] at [phone] with questions or concerns regarding your invoice.   IF you received labwork today, you will receive an invoice from [place]. Please contact [name] at [phone] with questions or concerns regarding your invoice.   Our billing staff will not be able to assist you with questions regarding bills from these companies.  You will be contacted with the lab results as soon as they are available. The fastest way to get your results is to activate your [name] account. Instructions are located on the last page of this paperwork. If you have not heard from [place] regarding the results in 2 weeks, please contact this office.     Rehydration, Adult Rehydration is the replacement of body fluids and salts and minerals (electrolytes) that are lost during dehydration. Dehydration is when there is not enough fluid or water in the body. This happens when you lose more fluids than you take in. Common causes of dehydration include:  Vomiting.  Diarrhea.  Excessive sweating, such as from heat exposure or exercise.  Taking  medicines that cause the body to lose excess fluid (diuretics).  Impaired kidney function.  Not drinking enough fluid.  Certain illnesses or infections.  Certain poorly controlled long-term (chronic) illnesses, such as diabetes, heart disease, and kidney disease. Symptoms of mild dehydration may include thirst, dry lips and mouth, dry skin, and dizziness. Symptoms of severe dehydration may include increased heart rate, confusion, fainting, and not urinating. You can rehydrate by drinking certain fluids or getting fluids through an IV tube, as told by your health care provider. What are the risks? Generally, rehydration is safe. However, one problem that can happen is taking in too much fluid (overhydration). This is rare. If overhydration happens, it can cause an electrolyte imbalance, kidney failure, or a decrease in salt (sodium) levels in the body. How to rehydrate Follow instructions from your health care provider for rehydration. The kind of fluid you should drink and the amount you should drink depend on your condition.  If directed by your health care provider, drink an oral rehydration solution (ORS). This is a drink designed to treat dehydration that is found in pharmacies and retail stores.  Make an ORS by following instructions on the package.  Start by drinking small amounts, about  cup (120 mL) every 5-10 minutes.  Slowly increase how much you drink until you have taken the amount recommended by your health care provider.  Drink enough clear fluids to keep your urine clear or pale yellow. If you were  instructed to drink an ORS, finish the ORS first, then start slowly drinking other clear fluids. Drink fluids such as:  Water. Do not drink only water. Doing that can lead to having too little sodium in your body (hyponatremia).  Ice chips.  Fruit juice that you have added water to (diluted juice).  Low-calorie sports drinks.  If you are severely dehydrated, your health  care provider may recommend that you receive fluids through an IV tube in the hospital.  Do not take sodium tablets. Doing that can lead to the condition of having too much sodium in your body (hypernatremia). Eating while you rehydrate Follow instructions from your health care provider about what to eat while you rehydrate. Your health care provider may recommend that you slowly begin eating regular foods in small amounts.  Eat foods that contain a healthy balance of electrolytes, such as bananas, oranges, potatoes, tomatoes, and spinach.  Avoid foods that are greasy or contain a lot of fat or sugar. In some cases, you may get nutrition through a feeding tube that is passed through your nose and into your stomach (nasogastric tube, or NG tube). This may be done if you have uncontrolled vomiting or diarrhea. Beverages to avoid Certain beverages may make dehydration worse. While you rehydrate, avoid:  Alcohol.  Caffeine.  Drinks that contain a lot of sugar. These include:  High-calorie sports drinks.  Fruit juice that is not diluted.  Soda. Check nutrition labels to see how much sugar or caffeine a beverage contains. Signs of dehydration recovery You may be recovering from dehydration if:  You are urinating more often than before you started rehydrating.  Your urine is clear or pale yellow.  Your energy level improves.  You vomit less frequently.  You have diarrhea less frequently.  Your appetite improves or returns to normal.  You feel less dizzy or less light-headed.  Your skin tone and color start to look more normal. Contact a health care provider if:  You continue to have symptoms of mild dehydration, such as:  Thirst.  Dry lips.  Slightly dry mouth.  Dry, warm skin.  Dizziness.  You continue to vomit or have diarrhea. Get help right away if:  You have symptoms of dehydration that get worse.  You feel:  Confused.  Weak.  Like you are going to  faint.  You have not urinated in 6-8 hours.  You have very dark urine.  You have trouble breathing.  Your heart rate while sitting still is over 100 beats a minute.  You cannot drink fluids without vomiting.  You have vomiting or diarrhea that:  Gets worse.  Does not go away.  You have a fever. This information is not intended to replace advice given to you by your health care provider. Make sure you discuss any questions you have with your health care provider. Document Released: 07/09/2011 Document Revised: 11/04/2015 Document Reviewed: 06/10/2015 Elsevier Interactive Patient Education  2017 [employer].  Influenza, Adult Influenza, more commonly known as "the flu," is a viral infection that primarily affects the respiratory tract. The respiratory tract includes organs that help you breathe, such as the lungs, nose, and throat. The flu causes many common cold symptoms, as well as a high fever and body aches. The flu spreads easily from person to person (is contagious). Getting a flu shot (influenza vaccination) every year is the best way to prevent influenza. What are the causes? Influenza is caused by a virus. You can catch the virus by:  Breathing in droplets from an infected person's cough or sneeze.  Touching something that was recently contaminated with the virus and then touching your mouth, nose, or eyes. What increases the risk? The following factors may make you more likely to get the flu:  Not cleaning your hands frequently with soap and water or alcohol-based hand sanitizer.  Having close contact with many people during cold and flu season.  Touching your mouth, eyes, or nose without washing or sanitizing your hands first.  Not drinking enough fluids or not eating a healthy diet.  Not getting enough sleep or exercise.  Being under a high amount of stress.  Not getting a yearly (annual) flu shot. You may be at a higher risk of complications from the flu,  such as a severe lung infection (pneumonia), if you:  Are over the age of 27.  Are pregnant.  Have a weakened disease-fighting system (immune system). You may have a weakened immune system if you:  Have HIV or AIDS.  Are undergoing chemotherapy.  Aretaking medicines that reduce the activity of (suppress) the immune system.  Have a long-term (chronic) illness, such as heart disease, kidney disease, diabetes, or lung disease.  Have a liver disorder.  Are obese.  Have anemia. What are the signs or symptoms? Symptoms of this condition typically last 4-10 days and may include:  Fever.  Chills.  Headache, body aches, or muscle aches.  Sore throat.  Cough.  Runny or congested nose.  Chest discomfort and cough.  Poor appetite.  Weakness or tiredness (fatigue).  Dizziness.  Nausea or vomiting. How is this diagnosed? This condition may be diagnosed based on your medical history and a physical exam. Your health care provider may do a nose or throat swab test to confirm the diagnosis. How is this treated? If influenza is detected early, you can be treated with antiviral medicine that can reduce the length of your illness and the severity of your symptoms. This medicine may be given by mouth (orally) or through an IV tube that is inserted in one of your veins. The goal of treatment is to relieve symptoms by taking care of yourself at home. This may include taking over-the-counter medicines, drinking plenty of fluids, and adding humidity to the air in your home. In some cases, influenza goes away on its own. Severe influenza or complications from influenza may be treated in a hospital. Follow these instructions at home:  Take over-the-counter and prescription medicines only as told by your health care provider.  Use a cool mist humidifier to add humidity to the air in your home. This can make breathing easier.  Rest as needed.  Drink enough fluid to keep your urine clear  or pale yellow.  Cover your mouth and nose when you cough or sneeze.  Wash your hands with soap and water often, especially after you cough or sneeze. If soap and water are not available, use hand sanitizer.  Stay home from work or school as told by your health care provider. Unless you are visiting your health care provider, try to avoid leaving home until your fever has been gone for 24 hours without the use of medicine.  Keep all follow-up visits as told by your health care provider. This is important. How is this prevented?  Getting an annual flu shot is the best way to avoid getting the flu. You may get the flu shot in late summer, fall, or winter. Ask your health care provider when you should get  your flu shot.  Wash your hands often or use hand sanitizer often.  Avoid contact with people who are sick during cold and flu season.  Eat a healthy diet, drink plenty of fluids, get enough sleep, and exercise regularly. Contact a health care provider if:  You develop new symptoms.  You have:  Chest pain.  Diarrhea.  A fever.  Your cough gets worse.  You produce more mucus.  You feel nauseous or you vomit. Get help right away if:  You develop shortness of breath or difficulty breathing.  Your skin or nails turn a bluish color.  You have severe pain or stiffness in your neck.  You develop a sudden headache or sudden pain in your face or ear.  You cannot stop vomiting. This information is not intended to replace advice given to you by your health care provider. Make sure you discuss any questions you have with your health care provider. Document Released: 04/13/2000 Document Revised: 09/22/2015 Document Reviewed: 02/08/2015 Elsevier Interactive Patient Education  2017 [employer].

## 2016-06-29 LAB — BASIC METABOLIC PANEL
BUN/Creatinine Ratio: 8 — ABNORMAL LOW (ref 9–20)
BUN: 9 mg/dL (ref 6–24)
CO2: 24 mmol/L (ref 18–29)
Calcium: 9.9 mg/dL (ref 8.7–10.2)
Chloride: 97 mmol/L (ref 96–106)
Creatinine, Ser: 1.19 mg/dL (ref 0.76–1.27)
GFR, EST AFRICAN AMERICAN: 78 mL/min/{1.73_m2} (ref >59)
GFR, EST NON AFRICAN AMERICAN: 68 mL/min/{1.73_m2} (ref >59)
Glucose: 89 mg/dL (ref 65–99)
POTASSIUM: 4.6 mmol/L (ref 3.5–5.2)
SODIUM: 140 mmol/L (ref 134–144)

## 2016-06-30 ENCOUNTER — Ambulatory Visit (INDEPENDENT_AMBULATORY_CARE_PROVIDER_SITE_OTHER): Payer: PPO

## 2016-06-30 ENCOUNTER — Telehealth: Payer: Self-pay

## 2016-06-30 ENCOUNTER — Ambulatory Visit (INDEPENDENT_AMBULATORY_CARE_PROVIDER_SITE_OTHER): Payer: PPO | Admitting: Family Medicine

## 2016-06-30 VITALS — BP 112/80 | HR 99 | Temp 98.3°F | Resp 14 | Ht 70.5 in | Wt 147.0 lb

## 2016-06-30 DIAGNOSIS — R079 Chest pain, unspecified: Secondary | ICD-10-CM | POA: Diagnosis not present

## 2016-06-30 DIAGNOSIS — R059 Cough, unspecified: Secondary | ICD-10-CM

## 2016-06-30 DIAGNOSIS — R809 Proteinuria, unspecified: Secondary | ICD-10-CM

## 2016-06-30 DIAGNOSIS — R0781 Pleurodynia: Secondary | ICD-10-CM

## 2016-06-30 DIAGNOSIS — E860 Dehydration: Secondary | ICD-10-CM

## 2016-06-30 DIAGNOSIS — R05 Cough: Secondary | ICD-10-CM

## 2016-06-30 LAB — POCT URINALYSIS DIP (MANUAL ENTRY)
BILIRUBIN UA: NEGATIVE
BILIRUBIN UA: NEGATIVE
Glucose, UA: NEGATIVE
Leukocytes, UA: NEGATIVE
Nitrite, UA: NEGATIVE
PH UA: 7.5
Protein Ur, POC: 100 — AB
SPEC GRAV UA: 1.01
Urobilinogen, UA: 0.2

## 2016-06-30 LAB — POC MICROSCOPIC URINALYSIS (UMFC): Mucus: ABSENT

## 2016-06-30 MED ORDER — IPRATROPIUM BROMIDE 0.03 % NA SOLN: 2.0000 | Freq: Four times a day (QID) | NASAL | 1 refills | Status: DC

## 2016-06-30 MED ORDER — BENZONATATE 200 MG PO CAPS: 200.0000 mg | ORAL_CAPSULE | Freq: Three times a day (TID) | ORAL | 0 refills | Status: DC

## 2016-06-30 MED ORDER — METHYLPREDNISOLONE ACETATE 80 MG/ML IJ SUSP
80.0000 mg | Freq: Once | INTRAMUSCULAR | Status: AC
  Administered 2016-06-30: 80 mg via INTRAMUSCULAR

## 2016-06-30 MED ORDER — ALBUTEROL SULFATE 108 (90 BASE) MCG/ACT IN AEPB: 2.0000 | INHALATION_SPRAY | RESPIRATORY_TRACT | 2 refills | Status: DC | PRN

## 2016-06-30 MED ORDER — GI COCKTAIL ~~LOC~~
30.0000 mL | Freq: Once | ORAL | Status: AC
  Administered 2016-06-30: 30 mL via ORAL

## 2016-06-30 NOTE — Patient Instructions (Addendum)
IF you received an x-ray today, you will receive an invoice from [facility]. Please contact [facility] at [phone] with questions or concerns regarding your invoice.   IF you received labwork today, you will receive an invoice from [facility]. Please contact [name] at [phone] with questions or concerns regarding your invoice.   Our billing staff will not be able to assist you with questions regarding bills from these companies.  You will be contacted with the lab results as soon as they are available. The fastest way to get your results is to activate your [name] account. Instructions are located on the last page of this paperwork. If you have not heard from [place] regarding the results in 2 weeks, please contact this office.     Influenza, Adult Influenza, more commonly known as "the flu," is a viral infection that primarily affects the respiratory tract. The respiratory tract includes organs that help you breathe, such as the lungs, nose, and throat. The flu causes many common cold symptoms, as well as a high fever and body aches. The flu spreads easily from person to person (is contagious). Getting a flu shot (influenza vaccination) every year is the best way to prevent influenza. What are the causes? Influenza is caused by a virus. You can catch the virus by:  Breathing in droplets from an infected person's cough or sneeze.  Touching something that was recently contaminated with the virus and then touching your mouth, nose, or eyes. What increases the risk? The following factors may make you more likely to get the flu:  Not cleaning your hands frequently with soap and water or alcohol-based hand sanitizer.  Having close contact with many people during cold and flu season.  Touching your mouth, eyes, or nose without washing or sanitizing your hands first.  Not drinking enough fluids or not eating a healthy diet.  Not getting enough sleep or  exercise.  Being under a high amount of stress.  Not getting a yearly (annual) flu shot. You may be at a higher risk of complications from the flu, such as a severe lung infection (pneumonia), if you:  Are over the age of 70.  Are pregnant.  Have a weakened disease-fighting system (immune system). You may have a weakened immune system if you:  Have HIV or AIDS.  Are undergoing chemotherapy.  Aretaking medicines that reduce the activity of (suppress) the immune system.  Have a long-term (chronic) illness, such as heart disease, kidney disease, diabetes, or lung disease.  Have a liver disorder.  Are obese.  Have anemia. What are the signs or symptoms? Symptoms of this condition typically last 4-10 days and may include:  Fever.  Chills.  Headache, body aches, or muscle aches.  Sore throat.  Cough.  Runny or congested nose.  Chest discomfort and cough.  Poor appetite.  Weakness or tiredness (fatigue).  Dizziness.  Nausea or vomiting. How is this diagnosed? This condition may be diagnosed based on your medical history and a physical exam. Your health care provider may do a nose or throat swab test to confirm the diagnosis. How is this treated? If influenza is detected early, you can be treated with antiviral medicine that can reduce the length of your illness and the severity of your symptoms. This medicine may be given by mouth (orally) or through an IV tube that is inserted in one of your veins. The goal of treatment is to relieve symptoms by taking care of yourself at home.  This may include taking over-the-counter medicines, drinking plenty of fluids, and adding humidity to the air in your home. In some cases, influenza goes away on its own. Severe influenza or complications from influenza may be treated in a hospital. Follow these instructions at home:  Take over-the-counter and prescription medicines only as told by your health care provider.  Use a cool  mist humidifier to add humidity to the air in your home. This can make breathing easier.  Rest as needed.  Drink enough fluid to keep your urine clear or pale yellow.  Cover your mouth and nose when you cough or sneeze.  Wash your hands with soap and water often, especially after you cough or sneeze. If soap and water are not available, use hand sanitizer.  Stay home from work or school as told by your health care provider. Unless you are visiting your health care provider, try to avoid leaving home until your fever has been gone for 24 hours without the use of medicine.  Keep all follow-up visits as told by your health care provider. This is important. How is this prevented?  Getting an annual flu shot is the best way to avoid getting the flu. You may get the flu shot in late summer, fall, or winter. Ask your health care provider when you should get your flu shot.  Wash your hands often or use hand sanitizer often.  Avoid contact with people who are sick during cold and flu season.  Eat a healthy diet, drink plenty of fluids, get enough sleep, and exercise regularly. Contact a health care provider if:  You develop new symptoms.  You have:  Chest pain.  Diarrhea.  A fever.  Your cough gets worse.  You produce more mucus.  You feel nauseous or you vomit. Get help right away if:  You develop shortness of breath or difficulty breathing.  Your skin or nails turn a bluish color.  You have severe pain or stiffness in your neck.  You develop a sudden headache or sudden pain in your face or ear.  You cannot stop vomiting. This information is not intended to replace advice given to you by your health care provider. Make sure you discuss any questions you have with your health care provider. Document Released: 04/13/2000 Document Revised: 09/22/2015 Document Reviewed: 02/08/2015 Elsevier Interactive Patient Education  2017 [employer]  Dehydration, Adult Dehydration is  a condition in which there is not enough fluid or water in the body. This happens when you lose more fluids than you take in. Important organs, such as the kidneys, brain, and heart, cannot function without a proper amount of fluids. Any loss of fluids from the body can lead to dehydration. Dehydration can range from mild to severe. This condition should be treated right away to prevent it from becoming severe. What are the causes? This condition may be caused by:  Vomiting.  Diarrhea.  Excessive sweating, such as from heat exposure or exercise.  Not drinking enough fluid, especially:  When ill.  While doing activity that requires a lot of energy.  Excessive urination.  Fever.  Infection.  Certain medicines, such as medicines that cause the body to lose excess fluid (diuretics).  Inability to access safe drinking water.  Reduced physical ability to get adequate water and food. What increases the risk? This condition is more likely to develop in people:  Who have a poorly controlled long-term (chronic) illness, such as diabetes, heart disease, or kidney disease.  Who are  age 64 or older.  Who are disabled.  Who live in a place with high altitude.  Who play endurance sports. What are the signs or symptoms? Symptoms of mild dehydration may include:   Thirst.  Dry lips.  Slightly dry mouth.  Dry, warm skin.  Dizziness. Symptoms of moderate dehydration may include:   Very dry mouth.  Muscle cramps.  Dark urine. Urine may be the color of tea.  Decreased urine production.  Decreased tear production.  Heartbeat that is irregular or faster than normal (palpitations).  Headache.  Light-headedness, especially when you stand up from a sitting position.  Fainting (syncope). Symptoms of severe dehydration may include:   Changes in skin, such as:  Cold and clammy skin.  Blotchy (mottled) or pale skin.  Skin that does not quickly return to normal after  being lightly pinched and released (poor skin turgor).  Changes in body fluids, such as:  Extreme thirst.  No tear production.  Inability to sweat when body temperature is high, such as in hot weather.  Very little urine production.  Changes in vital signs, such as:  Weak pulse.  Pulse that is more than 100 beats a minute when sitting still.  Rapid breathing.  Low blood pressure.  Other changes, such as:  Sunken eyes.  Cold hands and feet.  Confusion.  Lack of energy (lethargy).  Difficulty waking up from sleep.  Short-term weight loss.  Unconsciousness. How is this diagnosed? This condition is diagnosed based on your symptoms and a physical exam. Blood and urine tests may be done to help confirm the diagnosis. How is this treated? Treatment for this condition depends on the severity. Mild or moderate dehydration can often be treated at home. Treatment should be started right away. Do not wait until dehydration becomes severe. Severe dehydration is an emergency and it needs to be treated in a hospital. Treatment for mild dehydration may include:   Drinking more fluids.  Replacing salts and minerals in your blood (electrolytes) that you may have lost. Treatment for moderate dehydration may include:   Drinking an oral rehydration solution (ORS). This is a drink that helps you replace fluids and electrolytes (rehydrate). It can be found at pharmacies and retail stores. Treatment for severe dehydration may include:   Receiving fluids through an IV tube.  Receiving an electrolyte solution through a feeding tube that is passed through your nose and into your stomach (nasogastric tube, or NG tube).  Correcting any abnormalities in electrolytes.  Treating the underlying cause of dehydration. Follow these instructions at home:  If directed by your health care provider, drink an ORS:  Make an ORS by following instructions on the package.  Start by drinking small  amounts, about  cup (120 mL) every 5-10 minutes.  Slowly increase how much you drink until you have taken the amount recommended by your health care provider.  Drink enough clear fluid to keep your urine clear or pale yellow. If you were told to drink an ORS, finish the ORS first, then start slowly drinking other clear fluids. Drink fluids such as:  Water. Do not drink only water. Doing that can lead to having too little salt (sodium) in the body (hyponatremia).  Ice chips.  Fruit juice that you have added water to (diluted fruit juice).  Low-calorie sports drinks.  Avoid:  Alcohol.  Drinks that contain a lot of sugar. These include high-calorie sports drinks, fruit juice that is not diluted, and soda.  Caffeine.  Foods that  are greasy or contain a lot of fat or sugar.  Take over-the-counter and prescription medicines only as told by your health care provider.  Do not take sodium tablets. This can lead to having too much sodium in the body (hypernatremia).  Eat foods that contain a healthy balance of electrolytes, such as bananas, oranges, potatoes, tomatoes, and spinach.  Keep all follow-up visits as told by your health care provider. This is important. Contact a health care provider if:  You have abdominal pain that:  Gets worse.  Stays in one area (localizes).  You have a rash.  You have a stiff neck.  You are more irritable than usual.  You are sleepier or more difficult to wake up than usual.  You feel weak or dizzy.  You feel very thirsty.  You have urinated only a small amount of very dark urine over 6-8 hours. Get help right away if:  You have symptoms of severe dehydration.  You cannot drink fluids without vomiting.  Your symptoms get worse with treatment.  You have a fever.  You have a severe headache.  You have vomiting or diarrhea that:  Gets worse.  Does not go away.  You have blood or green matter (bile) in your vomit.  You have  blood in your stool. This may cause stool to look black and tarry.  You have not urinated in 6-8 hours.  You faint.  Your heart rate while sitting still is over 100 beats a minute.  You have trouble breathing. This information is not intended to replace advice given to you by your health care provider. Make sure you discuss any questions you have with your health care provider. Document Released: 04/16/2005 Document Revised: 11/11/2015 Document Reviewed: 06/10/2015 Elsevier Interactive Patient Education  2017 [employer].

## 2016-06-30 NOTE — Telephone Encounter (Signed)
Requesting pro air respiclick, I dont see on med list?

## 2016-06-30 NOTE — Progress Notes (Addendum)
By signing my name below, I, [name], attest that this documentation has been prepared under the direction and in the presence of [name], MD.  Electronically Signed: [name], Medical Scribe. 06/30/16. 9:34 AM.  Subjective:    Patient ID: William [name], male    DOB: 1961/04/17, 56 y.o.   MRN: [id number]  HPI Chief Complaint  Patient presents with  . Follow-up    Flu-like symptoms, not feeling any better    HPI Comments: William [name] is a 56 y.o. male who presents to the [facility] follow-up. I saw William [name] 2 days prior. He was ill with flu like sxs and significant dehydration with tachycardia, hypotension, and hematuria. He was advised to aggressively rehydrate and recheck today. Pt had sxs for 7 days and flu swab was negative but exam and CBC consistent with viral pattern so advised sxs care with close follow-up.  He notes he still has diarrhea, nausea, diaphoresis, sinus pressure pain, burning in his chest, and cough. His urine is light colored and no longer thick and yellow. Took OTC cough drops, OTC medication, and cough syrup BID for some relief of his sxs. He mentions the cough syrup doesn't last long when taken, but it does give initial relief. Pt hasn't used his inhaler. Pt didn't eat as much yesterday and was focused on drinking fluids. Pt drank water and 16.9 oz of pedialyte yesterday. Denies emesis.  Allergies: Reports codeine causes urinary retention.  Patient Active Problem List   Diagnosis Date Noted  . Isolated proteinuria without specific morphologic lesion 06/27/2016  . Testicular pain 06/27/2016  . Dry skin dermatitis 06/27/2016  . Acute left flank pain 06/27/2016  . GERD (gastroesophageal reflux disease) 10/13/2015  . Gastroparesis 10/13/2015  . Intractable chronic migraine without aura and without status migrainosus 12/15/2013  . Hyperlipidemia 04/04/2012  . MITRAL VALVE PROLAPSE 07/15/2009  . IRRITABLE BOWEL  SYNDROME 07/15/2009   Past Medical History:  Diagnosis Date  . Allergy   . Anxiety   . Depression   . Gastroparesis   . Heart murmur   . Migraine headache   . Mitral valve prolapse    No past surgical history on file. Allergies  Allergen Reactions  . Erythromycin Nausea And Vomiting and Anaphylaxis  . Hydrocodone-Acetaminophen Nausea And Vomiting  . Tramadol Nausea And Vomiting  . Cephalexin Diarrhea and Nausea And Vomiting  . Cephalosporins Diarrhea and Nausea And Vomiting  . Compazine [Prochlorperazine Edisylate]     uncontrollable shake - tremor  . Ketoconazole Itching  . Ketorolac Nausea And Vomiting  . Ketorolac Tromethamine     REACTION: Reaction not known  . Latex Itching    REACTION: Reaction not known  . Nalbuphine Other (See Comments)    States loss of consciousness REACTION: Reaction not known  . Oxycodone-Acetaminophen Other (See Comments) and Swelling  . Prednisone Nausea And Vomiting  . Prochlorperazine Edisylate     REACTION: shakes/tremors  . Topamax [Topiramate] Diarrhea and Nausea And Vomiting  . Cefdinir Diarrhea, Nausea And Vomiting and Rash  . Neosporin [Neomycin-Bacitracin Zn-Polymyx] Rash  . Oxycodone-Acetaminophen Other (See Comments)   Prior to Admission medications   Medication Sig Start Date End Date Taking? Authorizing Provider  ammonium lactate (AMLACTIN) 12 % lotion Apply 1 application topically 2 (two) times daily. 05/05/16  Yes Shawnee Knapp, MD  cetirizine (ZYRTEC) 10 MG tablet Take 1 tablet (10 mg total) by mouth at bedtime. 06/28/16  Yes Shawnee Knapp, MD  cyclobenzaprine (FLEXERIL) 5 MG tablet Take 1 tablet (5 mg total) by mouth at bedtime. 05/05/16  Yes Shawnee Knapp, MD  lidocaine (LIDODERM) 5 % Place 1 patch onto the skin daily. Remove & Discard patch within 12 hours or as directed by MD 02/08/14  Yes [name], MD  lisinopril (PRINIVIL,ZESTRIL) 5 MG tablet Take 5 mg by mouth daily.   Yes Historical Provider, MD  olopatadine (PATANOL) 0.1 %  ophthalmic solution Place 1 drop into both eyes 2 (two) times daily. 06/28/16  Yes Shawnee Knapp, MD  promethazine-dextromethorphan (PROMETHAZINE-DM) 6.25-15 MG/5ML syrup Take 5 mLs by mouth 4 (four) times daily as needed for cough. 06/28/16  Yes Shawnee Knapp, MD  urea (CARMOL) 40 % CREA Apply 1 application topically 2 (two) times daily. 05/05/16  Yes Shawnee Knapp, MD  Vitamin D, Ergocalciferol, (DRISDOL) 50000 units CAPS capsule Take 1 capsule (50,000 Units total) by mouth every 7 (seven) days. 05/05/16  Yes Shawnee Knapp, MD   Social History   Social History  . Marital status: Married    Spouse name: N/A  . Number of children: N/A  . Years of education: N/A   Occupational History  . Not on file.   Social History Main Topics  . Smoking status: Never Smoker  . Smokeless tobacco: Never Used  . Alcohol use No  . Drug use: No  . Sexual activity: Yes    Partners: Female   Other Topics Concern  . Not on file   Social History Narrative  . No narrative on file   Review of Systems  Constitutional: Positive for diaphoresis.  HENT: Positive for sinus pain.   Respiratory: Positive for cough.   Cardiovascular: Positive for chest pain.  Gastrointestinal: Positive for diarrhea and nausea. Negative for vomiting.   Objective:  Physical Exam  Constitutional: He appears well-developed and well-nourished. No distress.  HENT:  Head: Normocephalic and atraumatic.  Mouth/Throat: Oropharyngeal exudate (postnasal drip) present.  Eyes: Conjunctivae are normal.  Neck: Neck supple.  Cardiovascular: Regular rhythm, S1 normal, S2 normal and normal heart sounds.  Tachycardia present.  Exam reveals no gallop and no friction rub.   No murmur heard. Pulmonary/Chest: Effort normal and breath sounds normal. No respiratory distress. He has no decreased breath sounds. He has no wheezes. He has no rhonchi. He has no rales.  Lymphadenopathy:       Head (right side): Submandibular and tonsillar adenopathy present.        Head (left side): Submandibular and tonsillar adenopathy present.    He has no cervical adenopathy.       Right: No supraclavicular adenopathy present.       Left: No supraclavicular adenopathy present.  Neurological: He is alert.  Skin: Skin is warm and dry.  Psychiatric: He has a normal mood and affect. His behavior is normal.  Nursing note and vitals reviewed.  BP 112/80   Pulse 99   Temp 98.3 F (36.8 C) (Oral)   Resp 14   Ht 5' 10.5" (1.791 m)   Wt 147 lb (66.7 kg)   SpO2 99%   BMI 20.79 kg/m    Results for orders placed or performed in visit on 06/30/16  POCT urinalysis dipstick  Result Value Ref Range   Color, UA yellow yellow   Clarity, UA clear clear   Glucose, UA negative negative   Bilirubin, UA negative negative   Ketones, POC UA negative negative   Spec Grav, UA 1.010    Blood,  UA trace-lysed (A) negative   pH, UA 7.5    Protein Ur, POC =100 (A) negative   Urobilinogen, UA 0.2    Nitrite, UA Negative Negative   Leukocytes, UA Negative Negative  POCT Microscopic Urinalysis ([facility])  Result Value Ref Range   WBC,UR,HPF,POC None None WBC/hpf   RBC,UR,HPF,POC None None RBC/hpf   Bacteria None None, Too numerous to count   Mucus Absent Absent   Epithelial Cells, UR Per Microscopy Few (A) None, Too numerous to count cells/hpf   Dg Chest 2 View  Result Date: 06/30/2016 CLINICAL DATA:  Left-sided chest pain and cough. EXAM: CHEST  2 VIEW COMPARISON:  10/06/2015 FINDINGS: The heart size and mediastinal contours are within normal limits. There is no evidence of pulmonary edema, consolidation, pneumothorax, nodule or pleural fluid. The visualized skeletal structures are unremarkable. IMPRESSION: No active cardiopulmonary disease. Electronically Signed   By: Aletta Edouard M.D.   On: 06/30/2016 10:04   [EKG Reading]: Nl sinus rhythm. No signs of strain, no ischemic change  Orthostatic VS negative Orthostatic VS for the past 24 hrs (Last 3 readings):  BP- Lying Pulse-  Lying BP- Sitting Pulse- Sitting BP- Standing at 0 minutes Pulse- Standing at 0 minutes  06/30/16 1030 124/81 74 116/69 81 121/80 93    Assessment & Plan:   1. Proteinuria, unspecified type   2. Dehydration - has had done a good job pushing fluids and reversed his dehydration - cont  3. Pleurodynia   4. Cough   Pt still is in the midst of recovering from an acute flu-like viral syndrome. c/o severe chest pain when coughing and when laying flat. The burning in his chest that is worse when laying flat resolved for several hours with the GI cocktail given 2d prior and again today so given sampled of otc nexium 20mg  - take 30 min before breakfast and dinner for 1 wk.   DepoMedrol 80mg  IM x 1 to treat pleurisy. Be more aggressive with the promethazine-DM syrup and atrovent nasal spray, start tessalong as well. Recheck in 1 wk, sooner if worsening.  Orders Placed This Encounter  Procedures  . DG Chest 2 View    Standing Status:   Future    Number of Occurrences:   1    Standing Expiration Date:   06/30/2017    Order Specific Question:   Reason for Exam (SYMPTOM  OR DIAGNOSIS REQUIRED)    Answer:   left sided chest pain, worse with laying flat, cough    Order Specific Question:   Preferred imaging location?    Answer:   External  . CBC  . Sedimentation Rate  . Orthostatic vital signs  . Care order/instruction:    Scheduling Instructions:     Complete orders, AVS and go.  [name] POCT urinalysis dipstick  . POCT Microscopic Urinalysis ([facility])  . EKG 12-Lead    Meds ordered this encounter  Medications  . benzonatate (TESSALON) 200 MG capsule    Sig: Take 1 capsule (200 mg total) by mouth 3 (three) times daily.    Dispense:  40 capsule    Refill:  0  . gi cocktail (Maalox,Lidocaine,Donnatal)  . ipratropium (ATROVENT) 0.03 % nasal spray    Sig: Place 2 sprays into the nose 4 (four) times daily.    Dispense:  30 mL    Refill:  1  . methylPREDNISolone acetate (DEPO-MEDROL) injection 80 mg     I personally performed the services described in this documentation, which was scribed  in my presence. The recorded information has been reviewed and considered, and addended by me as needed.   [name], M.D.  Primary Care at [facility] [address] [place], [place] [address] [phone] phone [phone] fax  06/30/16 12:58 PM

## 2016-06-30 NOTE — Telephone Encounter (Signed)
Sent in to pharmacy.  

## 2016-07-01 LAB — CBC
HEMATOCRIT: 41.8 % (ref 37.5–51.0)
Hemoglobin: 14.1 g/dL (ref 13.0–17.7)
MCH: 31.1 pg (ref 26.6–33.0)
MCHC: 33.7 g/dL (ref 31.5–35.7)
MCV: 92 fL (ref 79–97)
PLATELETS: 267 10*3/uL (ref 150–379)
RBC: 4.54 x10E6/uL (ref 4.14–5.80)
RDW: 12.4 % (ref 12.3–15.4)
WBC: 6.6 10*3/uL (ref 3.4–10.8)

## 2016-07-01 LAB — SEDIMENTATION RATE: Sed Rate: 11 mm/hr (ref 0–30)

## 2016-07-03 DIAGNOSIS — R1032 Left lower quadrant pain: Secondary | ICD-10-CM | POA: Diagnosis not present

## 2016-07-05 ENCOUNTER — Ambulatory Visit: Payer: PPO | Admitting: Family Medicine

## 2016-07-05 DIAGNOSIS — K219 Gastro-esophageal reflux disease without esophagitis: Secondary | ICD-10-CM | POA: Diagnosis not present

## 2016-07-05 DIAGNOSIS — R1032 Left lower quadrant pain: Secondary | ICD-10-CM | POA: Diagnosis not present

## 2016-07-09 ENCOUNTER — Ambulatory Visit: Payer: PPO | Admitting: Family Medicine

## 2016-08-03 ENCOUNTER — Ambulatory Visit: Payer: PPO

## 2016-08-04 ENCOUNTER — Encounter: Payer: Self-pay | Admitting: Family Medicine

## 2016-08-04 ENCOUNTER — Ambulatory Visit (INDEPENDENT_AMBULATORY_CARE_PROVIDER_SITE_OTHER): Payer: PPO | Admitting: Family Medicine

## 2016-08-04 VITALS — BP 120/74 | HR 104 | Temp 97.8°F | Resp 18 | Ht 70.0 in | Wt 144.1 lb

## 2016-08-04 DIAGNOSIS — R059 Cough, unspecified: Secondary | ICD-10-CM

## 2016-08-04 DIAGNOSIS — R05 Cough: Secondary | ICD-10-CM | POA: Diagnosis not present

## 2016-08-04 DIAGNOSIS — R800 Isolated proteinuria: Secondary | ICD-10-CM | POA: Diagnosis not present

## 2016-08-04 DIAGNOSIS — E860 Dehydration: Secondary | ICD-10-CM | POA: Diagnosis not present

## 2016-08-04 DIAGNOSIS — R102 Pelvic and perineal pain: Secondary | ICD-10-CM | POA: Diagnosis not present

## 2016-08-04 DIAGNOSIS — R8289 Other abnormal findings on cytological and histological examination of urine: Secondary | ICD-10-CM

## 2016-08-04 DIAGNOSIS — R828 Abnormal findings on cytological and histological examination of urine: Secondary | ICD-10-CM | POA: Diagnosis not present

## 2016-08-04 LAB — POCT URINALYSIS DIP (MANUAL ENTRY)
Bilirubin, UA: NEGATIVE
GLUCOSE UA: NEGATIVE
Ketones, POC UA: NEGATIVE
LEUKOCYTES UA: NEGATIVE
NITRITE UA: NEGATIVE
PH UA: 6 (ref 5.0–8.0)
Protein Ur, POC: 100 — AB
Spec Grav, UA: 1.015 (ref 1.030–1.035)
UROBILINOGEN UA: 0.2 (ref ?–2.0)

## 2016-08-04 LAB — POC MICROSCOPIC URINALYSIS (UMFC)

## 2016-08-04 MED ORDER — CYPROHEPTADINE HCL 4 MG PO TABS: 4.0000 mg | ORAL_TABLET | Freq: Three times a day (TID) | ORAL | 0 refills | Status: DC | PRN

## 2016-08-04 MED ORDER — AZELASTINE HCL 0.15 % NA SOLN: 1.0000 | Freq: Two times a day (BID) | NASAL | 0 refills | Status: DC

## 2016-08-04 MED ORDER — ALBUTEROL SULFATE (2.5 MG/3ML) 0.083% IN NEBU
2.5000 mg | INHALATION_SOLUTION | Freq: Once | RESPIRATORY_TRACT | Status: AC
  Administered 2016-08-04: 2.5 mg via RESPIRATORY_TRACT

## 2016-08-04 MED ORDER — PANTOPRAZOLE SODIUM 40 MG PO TBEC: 40.0000 mg | DELAYED_RELEASE_TABLET | Freq: Every day | ORAL | 1 refills | Status: DC

## 2016-08-04 NOTE — Progress Notes (Addendum)
By signing my name below, I, [name], attest that this documentation has been prepared under the direction and in the presence of [name], MD.  Electronically Signed: [name], Medical Scribe. 08/04/16. 11:09 AM.  Subjective:    Patient ID: William [name], male    DOB: 1961/02/07, 56 y.o.   MRN: [id number]  HPI Chief Complaint  Patient presents with  . Cough    Sx's no better- Was prescribed Tessalon Perles & Patanol drops (had to stop due to headaches)    HPI Comments: William [name] is a 56 y.o. male who presents to the Primary Care at [facility] and [facility] complaining of dry cough that's been consistent since 06/30/16. Seen 1 month ago with acute flu like sxs. Over 1 week lab work and evaulation was consistent with a flu like pattern thought flu swab was negative. He was place on zyrtec with promethazine-DM cough syrup, patanol eye drops. When his sxs persisted he return and was treated with deopmedrol 80 mg IM. He had chest burning that did improve with a GI cocktail so he was placed on nexium BID for a week. Also given atrovent nasal spray and tessalon. Pt instructed to return in 1 week and that was 5 weeks prior. He did have a nl CXR, nl CBC, SED rate, and BMP.  Reports his cough is worse during the day and reports associated sxs of clear nasal drainage, and occasional chest pain and SOB. He has been taking tylenol daily for the past week, as well as promethazine-DM cough syrup with relief of his sxs but reports drowsiness as a side effects. Initially he was taking all of the medication rxed 06/30/16 but discontinues due to experiencing a HA. He discontinued zyrtec, atrovent nasal spray, lisinopril, topical lidocaine pain patch for the past year. He mentions when he got sick with the flu like sxs he stopped all of his medication until he got back to baseline. Pt has not noticed GERD/indigestion like sxs.  Urology: Pt was told the abx didn't work and was  rx'ed melixocam. Pt tried meloxicam in the past without relief of his sxs. Pt hasn't called Dr. Diona Fanti, but plans on making an appt in 2 days.  Back Pain: Reports his back doesn't hurt like it used to. Pt never recieved pelvic floor PT. Dr Allyson Sabal  Social Hx: Reports wife had nasal surgery 10 days ago and it's been difficult for him.   Patient Active Problem List   Diagnosis Date Noted  . Isolated proteinuria without specific morphologic lesion 06/27/2016  . Testicular pain 06/27/2016  . Dry skin dermatitis 06/27/2016  . Acute left flank pain 06/27/2016  . GERD (gastroesophageal reflux disease) 10/13/2015  . Gastroparesis 10/13/2015  . Intractable chronic migraine without aura and without status migrainosus 12/15/2013  . Hyperlipidemia 04/04/2012  . MITRAL VALVE PROLAPSE 07/15/2009  . IRRITABLE BOWEL SYNDROME 07/15/2009   Past Medical History:  Diagnosis Date  . Allergy   . Anxiety   . Depression   . Gastroparesis   . Heart murmur   . Migraine headache   . Mitral valve prolapse    No past surgical history on file. Allergies  Allergen Reactions  . Erythromycin Nausea And Vomiting and Anaphylaxis  . Hydrocodone-Acetaminophen Nausea And Vomiting  . Tramadol Nausea And Vomiting  . Cephalexin Diarrhea and Nausea And Vomiting  . Cephalosporins Diarrhea and Nausea And Vomiting  . Compazine [Prochlorperazine Edisylate]     uncontrollable shake - tremor  . Ketoconazole  Itching  . Ketorolac Nausea And Vomiting  . Ketorolac Tromethamine     REACTION: Reaction not known  . Latex Itching    REACTION: Reaction not known  . Nalbuphine Other (See Comments)    States loss of consciousness REACTION: Reaction not known  . Oxycodone-Acetaminophen Other (See Comments) and Swelling  . Prednisone Nausea And Vomiting  . Prochlorperazine Edisylate     REACTION: shakes/tremors  . Topamax [Topiramate] Diarrhea and Nausea And Vomiting  . Cefdinir Diarrhea, Nausea And Vomiting and Rash  .  Neosporin [Neomycin-Bacitracin Zn-Polymyx] Rash  . Oxycodone-Acetaminophen Other (See Comments)   Prior to Admission medications   Medication Sig Start Date End Date Taking? Authorizing Provider  Albuterol Sulfate (PROAIR RESPICLICK) 827 (90 Base) MCG/ACT AEPB Inhale 2 puffs into the lungs every 4 (four) hours as needed. 06/30/16  Yes Shawnee Knapp, MD  ammonium lactate (AMLACTIN) 12 % lotion Apply 1 application topically 2 (two) times daily. 05/05/16  Yes Shawnee Knapp, MD  cetirizine (ZYRTEC) 10 MG tablet Take 1 tablet (10 mg total) by mouth at bedtime. 06/28/16  Yes Shawnee Knapp, MD  cyclobenzaprine (FLEXERIL) 5 MG tablet Take 1 tablet (5 mg total) by mouth at bedtime. 05/05/16  Yes Shawnee Knapp, MD  ipratropium (ATROVENT) 0.03 % nasal spray Place 2 sprays into the nose 4 (four) times daily. 06/30/16  Yes Shawnee Knapp, MD  lidocaine (LIDODERM) 5 % Place 1 patch onto the skin daily. Remove & Discard patch within 12 hours or as directed by MD 02/08/14  Yes [name], MD  lisinopril (PRINIVIL,ZESTRIL) 5 MG tablet Take 5 mg by mouth daily.   Yes Historical Provider, MD  promethazine-dextromethorphan (PROMETHAZINE-DM) 6.25-15 MG/5ML syrup Take 5 mLs by mouth 4 (four) times daily as needed for cough. 06/28/16  Yes Shawnee Knapp, MD  urea (CARMOL) 40 % CREA Apply 1 application topically 2 (two) times daily. 05/05/16  Yes Shawnee Knapp, MD  Vitamin D, Ergocalciferol, (DRISDOL) 50000 units CAPS capsule Take 1 capsule (50,000 Units total) by mouth every 7 (seven) days. 05/05/16  Yes Shawnee Knapp, MD   Social History   Social History  . Marital status: Married    Spouse name: N/A  . Number of children: N/A  . Years of education: N/A   Occupational History  . Not on file.   Social History Main Topics  . Smoking status: Never Smoker  . Smokeless tobacco: Never Used  . Alcohol use No  . Drug use: No  . Sexual activity: Yes    Partners: Female   Other Topics Concern  . Not on file   Social History Narrative  . No  narrative on file   Review of Systems  HENT: Positive for rhinorrhea. Negative for ear pain and tinnitus.   Respiratory: Positive for cough and shortness of breath.   Cardiovascular: Positive for chest pain.  Gastrointestinal: Negative for abdominal pain.  Neurological: Positive for headaches.   Objective:  Physical Exam  Constitutional: He appears well-developed and well-nourished. No distress.  HENT:  Head: Normocephalic and atraumatic.  Right Ear: Tympanic membrane normal.  Left Ear: Tympanic membrane normal.  Mouth/Throat: Oropharynx is clear and moist.  Nares pale and boggy  Eyes: Conjunctivae are normal.  Neck: Neck supple. No thyroid mass and no thyromegaly present.  Cardiovascular: Normal rate, regular rhythm, S1 normal, S2 normal and normal heart sounds.  Exam reveals no gallop and no friction rub.   No murmur heard. Pulmonary/Chest: Effort normal  and breath sounds normal. No respiratory distress. He has no wheezes. He has no rales.  Abdominal: There is tenderness (chronic) in the left lower quadrant.  Lymphadenopathy:    He has no cervical adenopathy.  No lymph adenopathy  Neurological: He is alert.  Skin: Skin is warm and dry.  Psychiatric: He has a normal mood and affect. His behavior is normal.  Nursing note and vitals reviewed.  BP 120/74   Pulse (!) 104   Temp 97.8 F (36.6 C) (Oral)   Resp 18   Ht 5\' 10"  (1.778 m)   Wt 144 lb 2 oz (65.4 kg)   SpO2 95%   BMI 20.68 kg/m    Results for orders placed or performed in visit on 08/04/16  POCT urinalysis dipstick  Result Value Ref Range   Color, UA yellow yellow   Clarity, UA clear clear   Glucose, UA negative negative   Bilirubin, UA negative negative   Ketones, POC UA negative negative   Spec Grav, UA 1.015 1.030 - 1.035   Blood, UA small (A) negative   pH, UA 6.0 5.0 - 8.0   Protein Ur, POC =100 (A) negative   Urobilinogen, UA 0.2 Negative - 2.0   Nitrite, UA Negative Negative   Leukocytes, UA  Negative Negative  POCT Microscopic Urinalysis ([facility])  Result Value Ref Range   WBC,UR,HPF,POC None None WBC/hpf   RBC,UR,HPF,POC Few (A) None RBC/hpf   Bacteria None None, Too numerous to count   Mucus Present (A) Absent   Epithelial Cells, UR Per Microscopy Few (A) None, Too numerous to count cells/hpf   Predicted peak flow was 604. Pre-alb neb peak flow was 550; post was 570   Assessment & Plan:   1. Cough - suspect post-nasal drip  2. Isolated proteinuria without specific morphologic lesion - stable.  After pt is feeling better and cough resolved in sev wks, may retry very lowest dose poss of least potent acei/arb again to see if could tolerate any poss med for renal protection prior to f/u with nephrology but did have sig orthostatic sxs even with lisinopril 5mg   3. Dehydration   4. Pelvic pain in male - recommend pelvic PT with Earlie Counts - pt is VERY VERY anxious about this so states that he will do it but at this point is still wanting to hold off on placing referral until he is feeling better - call for referral whenever ready. Rec making appt with Dr. Diona Fanti to see if he has any other suggestions/opinions. I suspect this is due to a superficial perineal nerve impingement as has had intermittently for years, worse with certain positions/sitting, flairs up, no improvement with nsaids, antibiotics, antifungals, top steroids, muscle relaxants, etc.   5.      Abnormal urine cytology - rec second opinion with Dr. Diona Fanti who pt originally had appt w/. He was SO anxious and there was a significant wait time to see Dr. Luberta Robertson so pt was seen initially by Dr. Felipa Eth in [facility] who advised pt to ignore result since thought test should never have been ordered to work-up proteinuria in the first place and stated that result couldn't be trusted from a pathologist without specific expertise and high volume urine cytology experience (apparently he thought [facility] did not pass muster) so  no further w/u would be done.  Unfortunately, I did order this test that had an abnml result and pt is VERY VERY VERY anxious about his health conditions at baseline. I spoke  with [name] pathologist who mentioned that the differential of abnml urine cytology includes the poss of a flat malignant urinary wall lesion vs just more generalized inflammatory change so suggested that perhaps evaluation with cystoscopy might be indicated despite nml [place] and CT imaging.  Reviewed w/ pt numerous times that his work-up may benefit sig from "watchful waiting" of repeating the test in 6 mos but pt does NOT feel comfortable with the plan of no further evaluation unless develops persistent unexplained hematuria as was recommended by initial urology c/s so encouraged pt to pursue a second opinion.  See AVS.   Orders Placed This Encounter  Procedures  . Care order/instruction:    Scheduling Instructions:     Peak Flow (IF NEB IS ORDERED PLEASE DO BEFORE AND AFTER NEB)  . Care order/instruction:    Scheduling Instructions:     Complete orders, AVS and go.  [name] POCT urinalysis dipstick  . POCT Microscopic Urinalysis ([facility])    Meds ordered this encounter  Medications  . albuterol (PROVENTIL) (2.5 MG/3ML) 0.083% nebulizer solution 2.5 mg  . pantoprazole (PROTONIX) 40 MG tablet    Sig: Take 1 tablet (40 mg total) by mouth daily. 30 minutes before breakfast    Dispense:  30 tablet    Refill:  1  . Azelastine HCl 0.15 % SOLN    Sig: Place 1 spray into the nose 2 (two) times daily.    Dispense:  30 mL    Refill:  0  . cyproheptadine (PERIACTIN) 4 MG tablet    Sig: Take 1 tablet (4 mg total) by mouth 3 (three) times daily as needed for allergies or rhinitis.    Dispense:  30 tablet    Refill:  0   Over 40 min spent in face-to-face evaluation of and consultation with patient and coordination of care.  Over 50% of this time was spent counseling this patient.  I personally performed the services described in this  documentation, which was scribed in my presence. The recorded information has been reviewed and considered, and addended by me as needed.   [name], M.D.  Primary Care at [facility] [address] [place], [place] [address] [phone] phone [phone] fax  08/06/16 2:21 PM

## 2016-08-04 NOTE — Progress Notes (Signed)
umf

## 2016-08-04 NOTE — Patient Instructions (Addendum)
Restart the zyrtec.  Once we get the cyproheptadine approved you could change to that instead of the zyrtec if you are still having runny nose and cough. Start the azelastine nasal spray twice a day. If you are having any side effects to it let me know and we can change you to something else. Start the pantoprazole 30 minutes prior to dinner every day.  Use the benzonatate pills 3 times a day. Do NOT use the eye drops. Do all of this for 2 weeks. Please call or come back if you are getting a headache. Make sure you are doing PLENTY of fluids to treat the dehydration. Recheck with me in 2-4 weeks.  IF you received an x-ray today, you will receive an invoice from [facility]. Please contact [facility] at [phone] with questions or concerns regarding your invoice.   IF you received labwork today, you will receive an invoice from [facility]. Please contact [name] at [phone] with questions or concerns regarding your invoice.   Our billing staff will not be able to assist you with questions regarding bills from these companies.  You will be contacted with the lab results as soon as they are available. The fastest way to get your results is to activate your [name] account. Instructions are located on the last page of this paperwork. If you have not heard from [place] regarding the results in 2 weeks, please contact this office.    You cough is being caused by cough in immunocompetent adults upper airway cough syndrome (UACS), previously referred to as postnasal drip syndrome (PNDS). This is caused by chronic rhinitis/sinusitis with freq throat clearing and often made worse by silent laryngeal reflux (acid reflux into your throat that you don't feel).  Try at night allergy medications like the zyrtec but we will change to the more effective cyproheptadine if we can get it approved by your insurance and nasal sprays. Suppress your cough and throat clearing with candy and delsym and  tessalon pearles, not throat lozenges.  Allergic Rhinitis Allergic rhinitis is when the mucous membranes in the nose respond to allergens. Allergens are particles in the air that cause your body to have an allergic reaction. This causes you to release allergic antibodies. Through a chain of events, these eventually cause you to release histamine into the blood stream. Although meant to protect the body, it is this release of histamine that causes your discomfort, such as frequent sneezing, congestion, and an itchy, runny nose. What are the causes? Seasonal allergic rhinitis (hay fever) is caused by pollen allergens that may come from grasses, trees, and weeds. Year-round allergic rhinitis (perennial allergic rhinitis) is caused by allergens such as house dust mites, pet dander, and mold spores. What are the signs or symptoms?  Nasal stuffiness (congestion).  Itchy, runny nose with sneezing and tearing of the eyes. How is this diagnosed? Your health care provider can help you determine the allergen or allergens that trigger your symptoms. If you and your health care provider are unable to determine the allergen, skin or blood testing may be used. Your health care provider will diagnose your condition after taking your health history and performing a physical exam. Your health care provider may assess you for other related conditions, such as asthma, pink eye, or an ear infection. How is this treated? Allergic rhinitis does not have a cure, but it can be controlled by:  Medicines that block allergy symptoms. These may include allergy shots, nasal sprays, and oral antihistamines.  Avoiding the allergen. Hay fever may often be treated with antihistamines in pill or nasal spray forms. Antihistamines block the effects of histamine. There are over-the-counter medicines that may help with nasal congestion and swelling around the eyes. Check with your health care provider before taking or giving this  medicine. If avoiding the allergen or the medicine prescribed do not work, there are many new medicines your health care provider can prescribe. Stronger medicine may be used if initial measures are ineffective. Desensitizing injections can be used if medicine and avoidance does not work. Desensitization is when a patient is given ongoing shots until the body becomes less sensitive to the allergen. Make sure you follow up with your health care provider if problems continue. Follow these instructions at home: It is not possible to completely avoid allergens, but you can reduce your symptoms by taking steps to limit your exposure to them. It helps to know exactly what you are allergic to so that you can avoid your specific triggers. Contact a health care provider if:  You have a fever.  You develop a cough that does not stop easily (persistent).  You have shortness of breath.  You start wheezing.  Symptoms interfere with normal daily activities. This information is not intended to replace advice given to you by your health care provider. Make sure you discuss any questions you have with your health care provider. Document Released: 01/09/2001 Document Revised: 12/16/2015 Document Reviewed: 12/22/2012 Elsevier Interactive Patient Education  2017 [employer].

## 2016-08-13 ENCOUNTER — Ambulatory Visit: Payer: PPO | Admitting: Family Medicine

## 2016-08-20 ENCOUNTER — Ambulatory Visit: Payer: PPO | Admitting: Family Medicine

## 2016-09-28 ENCOUNTER — Ambulatory Visit (INDEPENDENT_AMBULATORY_CARE_PROVIDER_SITE_OTHER): Payer: PPO | Admitting: Emergency Medicine

## 2016-09-28 ENCOUNTER — Encounter: Payer: Self-pay | Admitting: Emergency Medicine

## 2016-09-28 VITALS — BP 114/74 | HR 93 | Temp 97.3°F | Resp 18 | Ht 70.0 in | Wt 146.2 lb

## 2016-09-28 DIAGNOSIS — R102 Pelvic and perineal pain: Secondary | ICD-10-CM

## 2016-09-28 DIAGNOSIS — R1032 Left lower quadrant pain: Secondary | ICD-10-CM | POA: Diagnosis not present

## 2016-09-28 DIAGNOSIS — G8929 Other chronic pain: Secondary | ICD-10-CM | POA: Insufficient documentation

## 2016-09-28 LAB — POCT URINALYSIS DIP (MANUAL ENTRY)
BILIRUBIN UA: NEGATIVE mg/dL
Bilirubin, UA: NEGATIVE
Glucose, UA: NEGATIVE mg/dL
LEUKOCYTES UA: NEGATIVE
Nitrite, UA: NEGATIVE
PH UA: 6 (ref 5.0–8.0)
SPEC GRAV UA: 1.02 (ref 1.010–1.025)
Urobilinogen, UA: 0.2 E.U./dL

## 2016-09-28 MED ORDER — ONDANSETRON HCL 4 MG PO TABS: 4.0000 mg | ORAL_TABLET | Freq: Three times a day (TID) | ORAL | 0 refills | Status: DC | PRN

## 2016-09-28 NOTE — Patient Instructions (Addendum)
IF you received an x-ray today, you will receive an invoice from [facility]. Please contact [facility] at [phone] with questions or concerns regarding your invoice.   IF you received labwork today, you will receive an invoice from [facility]. Please contact [name] at [phone] with questions or concerns regarding your invoice.   Our billing staff will not be able to assist you with questions regarding bills from these companies.  You will be contacted with the lab results as soon as they are available. The fastest way to get your results is to activate your [name] account. Instructions are located on the last page of this paperwork. If you have not heard from [place] regarding the results in 2 weeks, please contact this office.      Pelvic Pain, Male Pelvic pain is pain in your lower abdomen, below your belly button and between your hips. The pain may start suddenly (acute), keep coming back (recurring), or last a long time (chronic). Pelvic pain that lasts longer than six months is considered chronic. Pelvic pain may affect your:  Prostate gland.  Urinary system.  Digestive tract.  Musculoskeletal system.  There are many potential causes of pelvic pain. Sometimes, the pain can be a result of prostate, urinary, or digestive conditions. Strained muscles or ligaments may also cause pelvic pain. Sometimes, the cause is not known. Follow these instructions at home:  Take over-the-counter and prescription medicines only as told by your health care provider.  Rest as told by your health care provider.  Keep a journal of your pelvic pain. Write down: ? When the pain started. ? Where the pain is located. ? What seems to make the pain better or worse. ? Any symptoms you have along with the pain.  Keep all follow-up visits as told by your health care provider. This is important. Contact a health care provider if:  Medicine does not help your pain.  Your  pain comes back.  You have new symptoms.  You have a fever or chills.  You are constipated.  You have blood in your urine or stool.  You feel weak or lightheaded. Get help right away if:  You have sudden severe pain.  Your pain steadily gets worse.  You have severe pain along with fever, nausea, vomiting, or excessive sweating.  You lose consciousness. This information is not intended to replace advice given to you by your health care provider. Make sure you discuss any questions you have with your health care provider. Document Released: 01/09/2012 Document Revised: 05/11/2015 Document Reviewed: 02/04/2015 Elsevier Interactive Patient Education  2018 [employer].  Abdominal Pain, Adult Many things can cause belly (abdominal) pain. Most times, belly pain is not dangerous. Many cases of belly pain can be watched and treated at home. Sometimes belly pain is serious, though. Your doctor will try to find the cause of your belly pain. Follow these instructions at home:  Take over-the-counter and prescription medicines only as told by your doctor. Do not take medicines that help you poop (laxatives) unless told to by your doctor.  Drink enough fluid to keep your pee (urine) clear or pale yellow.  Watch your belly pain for any changes.  Keep all follow-up visits as told by your doctor. This is important. Contact a doctor if:  Your belly pain changes or gets worse.  You are not hungry, or you lose weight without trying.  You are having trouble pooping (constipated) or have watery poop (diarrhea) for more  than 2-3 days.  You have pain when you pee or poop.  Your belly pain wakes you up at night.  Your pain gets worse with meals, after eating, or with certain foods.  You are throwing up and cannot keep anything down.  You have a fever. Get help right away if:  Your pain does not go away as soon as your doctor says it should.  You cannot stop throwing up.  Your pain  is only in areas of your belly, such as the right side or the left lower part of the belly.  You have bloody or black poop, or poop that looks like tar.  You have very bad pain, cramping, or bloating in your belly.  You have signs of not having enough fluid or water in your body (dehydration), such as: ? Dark pee, very little pee, or no pee. ? Cracked lips. ? Dry mouth. ? Sunken eyes. ? Sleepiness. ? Weakness. This information is not intended to replace advice given to you by your health care provider. Make sure you discuss any questions you have with your health care provider. Document Released: 10/03/2007 Document Revised: 11/04/2015 Document Reviewed: 09/28/2015 Elsevier Interactive Patient Education  2017 [employer].

## 2016-09-28 NOTE — Progress Notes (Signed)
William [name] 56 y.o.   Chief Complaint  Patient presents with  . Abdominal Pain    for past couple of months; left side pain; possible hernia  . Dysuria    states it hurts to urinate    HISTORY OF PRESENT ILLNESS: This is a 56 y.o. male complaining of long term intermittent pains to both left hemipelvis and left lower abdomen; work up so far has been unequivocal and non-diagnostic; symptoms remain albeit intermittently with periods of almost total relief. Able to eat and drink ok; no new symptoms except ocassional nausea.  Abdominal Pain  This is a recurrent problem. The current episode started more than 1 month ago. The onset quality is undetermined. The problem occurs intermittently. The problem has been waxing and waning. The pain is located in the LLQ (left hemipelvis). The pain is mild. The quality of the pain is aching. The abdominal pain radiates to the scrotum. Associated symptoms include nausea (ocassional). Pertinent negatives include no anorexia, constipation, diarrhea, dysuria, fever, headaches, hematochezia, hematuria, melena, myalgias, vomiting or weight loss. Nothing aggravates the pain. 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 Prior diagnostic workup includes CT scan and ultrasound. His past medical history is significant for irritable bowel syndrome. There is no history of abdominal surgery, colon cancer, Crohn's disease, gallstones, GERD, pancreatitis, PUD or ulcerative colitis.     Prior to Admission medications   Medication Sig Start Date End Date Taking? Authorizing Provider  Albuterol Sulfate (PROAIR RESPICLICK) 161 (90 Base) MCG/ACT AEPB Inhale 2 puffs into the lungs every 4 (four) hours as needed. Patient not taking: Reported on 09/28/2016 06/30/16   Shawnee Knapp, MD  Azelastine HCl 0.15 % SOLN Place 1 spray into the nose 2 (two) times daily. Patient not taking: Reported on 09/28/2016 08/04/16   Shawnee Knapp, MD  cyproheptadine (PERIACTIN) 4 MG tablet Take 1 tablet (4 mg total)  by mouth 3 (three) times daily as needed for allergies or rhinitis. Patient not taking: Reported on 09/28/2016 08/04/16   Shawnee Knapp, MD  pantoprazole (PROTONIX) 40 MG tablet Take 1 tablet (40 mg total) by mouth daily. 30 minutes before breakfast Patient not taking: Reported on 09/28/2016 08/04/16   Shawnee Knapp, MD  Vitamin D, Ergocalciferol, (DRISDOL) 50000 units CAPS capsule Take 1 capsule (50,000 Units total) by mouth every 7 (seven) days. Patient not taking: Reported on 09/28/2016 05/05/16   Shawnee Knapp, MD    Allergies  Allergen Reactions  . Erythromycin Nausea And Vomiting and Anaphylaxis  . Hydrocodone-Acetaminophen Nausea And Vomiting  . Tramadol Nausea And Vomiting  . Cephalexin Diarrhea and Nausea And Vomiting  . Cephalosporins Diarrhea and Nausea And Vomiting  . Compazine [Prochlorperazine Edisylate]     uncontrollable shake - tremor  . Ketoconazole Itching  . Ketorolac Nausea And Vomiting  . Ketorolac Tromethamine     REACTION: Reaction not known  . Latex Itching    REACTION: Reaction not known  . Nalbuphine Other (See Comments)    States loss of consciousness REACTION: Reaction not known  . Oxycodone-Acetaminophen Other (See Comments) and Swelling  . Prednisone Nausea And Vomiting  . Prochlorperazine Edisylate     REACTION: shakes/tremors  . Topamax [Topiramate] Diarrhea and Nausea And Vomiting  . Cefdinir Diarrhea, Nausea And Vomiting and Rash  . Neosporin [Neomycin-Bacitracin Zn-Polymyx] Rash  . Oxycodone-Acetaminophen Other (See Comments)    Patient Active Problem List   Diagnosis Date Noted  . Isolated proteinuria without specific morphologic lesion 06/27/2016  . Testicular pain 06/27/2016  .  Dry skin dermatitis 06/27/2016  . Acute left flank pain 06/27/2016  . GERD (gastroesophageal reflux disease) 10/13/2015  . Gastroparesis 10/13/2015  . Intractable chronic migraine without aura and without status migrainosus 12/15/2013  . Hyperlipidemia 04/04/2012  . MITRAL  VALVE PROLAPSE 07/15/2009  . IRRITABLE BOWEL SYNDROME 07/15/2009    Past Medical History:  Diagnosis Date  . Allergy   . Anxiety   . Depression   . Gastroparesis   . Heart murmur   . Migraine headache   . Mitral valve prolapse     History reviewed. No pertinent surgical history.  Social History   Social History  . Marital status: Married    Spouse name: N/A  . Number of children: N/A  . Years of education: N/A   Occupational History  . Not on file.   Social History Main Topics  . Smoking status: Never Smoker  . Smokeless tobacco: Never Used  . Alcohol use No  . Drug use: No  . Sexual activity: Yes    Partners: Female   Other Topics Concern  . Not on file   Social History Narrative  . No narrative on file    Family History  Problem Relation Age of Onset  . Hypertension Mother   . Hypertension Brother      Review of Systems  Constitutional: Negative for chills, fever and weight loss.  HENT: Negative.   Eyes: Negative.   Respiratory: Negative.  Negative for cough and shortness of breath.   Cardiovascular: Negative.  Negative for chest pain, palpitations and leg swelling.  Gastrointestinal: Positive for abdominal pain and nausea (ocassional). Negative for anorexia, blood in stool, constipation, diarrhea, heartburn, hematochezia, melena and vomiting.  Genitourinary: Negative.  Negative for dysuria and hematuria.  Musculoskeletal: Negative.  Negative for back pain, myalgias and neck pain.  Skin: Negative for rash.  Neurological: Negative for dizziness, sensory change, focal weakness and headaches.  Endo/Heme/Allergies: Negative.   All other systems reviewed and are negative.   Vitals:   09/28/16 0819  BP: 114/74  Pulse: 93  Resp: 18  Temp: 97.3 F (36.3 C)    Physical Exam  Constitutional: He is oriented to person, place, and time. He appears well-developed and well-nourished.  HENT:  Head: Normocephalic and atraumatic.  Nose: Nose normal.    Mouth/Throat: Oropharynx is clear and moist. No oropharyngeal exudate.  Eyes: Conjunctivae and EOM are normal. Pupils are equal, round, and reactive to light.  Neck: Normal range of motion. Neck supple. No JVD present. No thyromegaly present.  Cardiovascular: Normal rate, regular rhythm, normal heart sounds and intact distal pulses.   Pulmonary/Chest: Effort normal and breath sounds normal.  Abdominal: Soft. Bowel sounds are normal. He exhibits no distension. There is no tenderness. Hernia confirmed negative in the right inguinal area and confirmed negative in the left inguinal area.  Genitourinary: Testes normal and penis normal. Cremasteric reflex is present. Right testis shows no mass, no swelling and no tenderness. Left testis shows no mass, no swelling and no tenderness. Circumcised. No penile tenderness. No discharge found.  Musculoskeletal: Normal range of motion.  Lymphadenopathy:    He has no cervical adenopathy. No inguinal adenopathy noted on the right or left side.  Neurological: He is alert and oriented to person, place, and time. No sensory deficit. He exhibits normal muscle tone.  Skin: Skin is warm and dry. Capillary refill takes less than 2 seconds. No rash noted.  Psychiatric: He has a normal mood and affect. His behavior is normal.  Vitals reviewed.    ASSESSMENT & PLAN: LLQ tenderness raises possibility of diverticular disease; however CT scan A/P done last January did not show diverticulosis/itis. U/S mentioned questionable left inguinal hernia but non-palpable clinically. No red flag signs or symptoms but definite diagnosis remains a question. Will continue work up. I suggest GI/GU consults, repeat CT A/P and blood work.  Taegan was seen today for abdominal pain and dysuria.  Diagnoses and all orders for this visit:  Pelvic pain in male -     POCT urinalysis dipstick -     Urine culture -     Ambulatory referral to Urology  Abdominal pain, left lower quadrant -      CBC with Differential/Platelet -     Comprehensive metabolic panel -     Ambulatory referral to Gastroenterology  Other orders -     ondansetron (ZOFRAN) 4 MG tablet; Take 1 tablet (4 mg total) by mouth every 8 (eight) hours as needed for nausea or vomiting.    Patient Instructions       IF you received an x-ray today, you will receive an invoice from [facility]. Please contact [facility] at [phone] with questions or concerns regarding your invoice.   IF you received labwork today, you will receive an invoice from [facility]. Please contact [name] at [phone] with questions or concerns regarding your invoice.   Our billing staff will not be able to assist you with questions regarding bills from these companies.  You will be contacted with the lab results as soon as they are available. The fastest way to get your results is to activate your [name] account. Instructions are located on the last page of this paperwork. If you have not heard from [place] regarding the results in 2 weeks, please contact this office.      Pelvic Pain, Male Pelvic pain is pain in your lower abdomen, below your belly button and between your hips. The pain may start suddenly (acute), keep coming back (recurring), or last a long time (chronic). Pelvic pain that lasts longer than six months is considered chronic. Pelvic pain may affect your:  Prostate gland.  Urinary system.  Digestive tract.  Musculoskeletal system.  There are many potential causes of pelvic pain. Sometimes, the pain can be a result of prostate, urinary, or digestive conditions. Strained muscles or ligaments may also cause pelvic pain. Sometimes, the cause is not known. Follow these instructions at home:  Take over-the-counter and prescription medicines only as told by your health care provider.  Rest as told by your health care provider.  Keep a journal of your pelvic pain. Write down: ? When the pain  started. ? Where the pain is located. ? What seems to make the pain better or worse. ? Any symptoms you have along with the pain.  Keep all follow-up visits as told by your health care provider. This is important. Contact a health care provider if:  Medicine does not help your pain.  Your pain comes back.  You have new symptoms.  You have a fever or chills.  You are constipated.  You have blood in your urine or stool.  You feel weak or lightheaded. Get help right away if:  You have sudden severe pain.  Your pain steadily gets worse.  You have severe pain along with fever, nausea, vomiting, or excessive sweating.  You lose consciousness. This information is not intended to replace advice given to you by your health care provider. Make sure  you discuss any questions you have with your health care provider. Document Released: 01/09/2012 Document Revised: 05/11/2015 Document Reviewed: 02/04/2015 Elsevier Interactive Patient Education  2018 [place].  Abdominal Pain, Adult Many things can cause belly (abdominal) pain. Most times, belly pain is not dangerous. Many cases of belly pain can be watched and treated at home. Sometimes belly pain is serious, though. Your doctor will try to find the cause of your belly pain. Follow these instructions at home:  Take over-the-counter and prescription medicines only as told by your doctor. Do not take medicines that help you poop (laxatives) unless told to by your doctor.  Drink enough fluid to keep your pee (urine) clear or pale yellow.  Watch your belly pain for any changes.  Keep all follow-up visits as told by your doctor. This is important. Contact a doctor if:  Your belly pain changes or gets worse.  You are not hungry, or you lose weight without trying.  You are having trouble pooping (constipated) or have watery poop (diarrhea) for more than 2-3 days.  You have pain when you pee or poop.  Your belly pain wakes you  up at night.  Your pain gets worse with meals, after eating, or with certain foods.  You are throwing up and cannot keep anything down.  You have a fever. Get help right away if:  Your pain does not go away as soon as your doctor says it should.  You cannot stop throwing up.  Your pain is only in areas of your belly, such as the right side or the left lower part of the belly.  You have bloody or black poop, or poop that looks like tar.  You have very bad pain, cramping, or bloating in your belly.  You have signs of not having enough fluid or water in your body (dehydration), such as: ? Dark pee, very little pee, or no pee. ? Cracked lips. ? Dry mouth. ? Sunken eyes. ? Sleepiness. ? Weakness. This information is not intended to replace advice given to you by your health care provider. Make sure you discuss any questions you have with your health care provider. Document Released: 10/03/2007 Document Revised: 11/04/2015 Document Reviewed: 09/28/2015 Elsevier Interactive Patient Education  2017 Elsevier Inc.     Agustina Caroli, MD [facility]

## 2016-09-29 ENCOUNTER — Encounter: Payer: Self-pay | Admitting: Family Medicine

## 2016-09-29 ENCOUNTER — Ambulatory Visit (INDEPENDENT_AMBULATORY_CARE_PROVIDER_SITE_OTHER): Payer: PPO | Admitting: Family Medicine

## 2016-09-29 VITALS — BP 114/63 | HR 97 | Temp 97.7°F | Resp 16 | Ht 70.0 in | Wt 145.1 lb

## 2016-09-29 DIAGNOSIS — N3010 Interstitial cystitis (chronic) without hematuria: Secondary | ICD-10-CM | POA: Diagnosis not present

## 2016-09-29 DIAGNOSIS — R800 Isolated proteinuria: Secondary | ICD-10-CM | POA: Diagnosis not present

## 2016-09-29 DIAGNOSIS — R51 Headache: Secondary | ICD-10-CM | POA: Diagnosis not present

## 2016-09-29 DIAGNOSIS — R519 Headache, unspecified: Secondary | ICD-10-CM

## 2016-09-29 DIAGNOSIS — H04129 Dry eye syndrome of unspecified lacrimal gland: Secondary | ICD-10-CM | POA: Diagnosis not present

## 2016-09-29 LAB — CBC WITH DIFFERENTIAL/PLATELET
BASOS ABS: 0 10*3/uL (ref 0.0–0.2)
Basos: 0 %
EOS (ABSOLUTE): 0 10*3/uL (ref 0.0–0.4)
Eos: 1 %
HEMOGLOBIN: 14.2 g/dL (ref 13.0–17.7)
Hematocrit: 42.7 % (ref 37.5–51.0)
Immature Grans (Abs): 0 10*3/uL (ref 0.0–0.1)
Immature Granulocytes: 0 %
LYMPHS ABS: 1.7 10*3/uL (ref 0.7–3.1)
Lymphs: 25 %
MCH: 31.4 pg (ref 26.6–33.0)
MCHC: 33.3 g/dL (ref 31.5–35.7)
MCV: 95 fL (ref 79–97)
MONOCYTES: 7 %
Monocytes Absolute: 0.5 10*3/uL (ref 0.1–0.9)
NEUTROS ABS: 4.4 10*3/uL (ref 1.4–7.0)
Neutrophils: 67 %
Platelets: 281 10*3/uL (ref 150–379)
RBC: 4.52 x10E6/uL (ref 4.14–5.80)
RDW: 13 % (ref 12.3–15.4)
WBC: 6.7 10*3/uL (ref 3.4–10.8)

## 2016-09-29 LAB — COMPREHENSIVE METABOLIC PANEL
A/G RATIO: 1.5 (ref 1.2–2.2)
ALBUMIN: 4.1 g/dL (ref 3.5–5.5)
ALK PHOS: 65 IU/L (ref 39–117)
ALT: 13 IU/L (ref 0–44)
AST: 15 IU/L (ref 0–40)
BILIRUBIN TOTAL: 0.8 mg/dL (ref 0.0–1.2)
BUN / CREAT RATIO: 11 (ref 9–20)
BUN: 11 mg/dL (ref 6–24)
CHLORIDE: 103 mmol/L (ref 96–106)
CO2: 22 mmol/L (ref 18–29)
Calcium: 9.5 mg/dL (ref 8.7–10.2)
Creatinine, Ser: 0.98 mg/dL (ref 0.76–1.27)
GFR calc Af Amer: 99 mL/min/{1.73_m2} (ref >59)
GFR calc non Af Amer: 86 mL/min/{1.73_m2} (ref >59)
GLUCOSE: 82 mg/dL (ref 65–99)
Globulin, Total: 2.8 g/dL (ref 1.5–4.5)
POTASSIUM: 4.2 mmol/L (ref 3.5–5.2)
SODIUM: 139 mmol/L (ref 134–144)
Total Protein: 6.9 g/dL (ref 6.0–8.5)

## 2016-09-29 LAB — URINE CULTURE: ORGANISM ID, BACTERIA: NO GROWTH

## 2016-09-29 MED ORDER — CYCLOSPORINE 0.05 % OP EMUL: 1.0000 [drp] | Freq: Two times a day (BID) | OPHTHALMIC | 1 refills | Status: DC

## 2016-09-29 MED ORDER — BUTALBITAL-APAP-CAFFEINE 50-325-40 MG PO TABS: 1.0000 | ORAL_TABLET | ORAL | 0 refills | Status: DC | PRN

## 2016-09-29 NOTE — Patient Instructions (Addendum)
Restart the miralax daily - we need to keep your bowel regular. Start the fioricet as needed to treat a headache. I will see if we can set you up to be able to do a nurse only visit for oxygen to treat a headache that is resistant. Try artificial tears like systane for Dry Eye or Restasis.    IF you received an x-ray today, you will receive an invoice from [facility]. Please contact [facility] Radiology at [phone] with questions or concerns regarding your invoice.   IF you received labwork today, you will receive an invoice from [facility]. Please contact [name] at [phone] with questions or concerns regarding your invoice.   Our billing staff will not be able to assist you with questions regarding bills from these companies.  You will be contacted with the lab results as soon as they are available. The fastest way to get your results is to activate your [name] account. Instructions are located on the last page of this paperwork. If you have not heard from [place] regarding the results in 2 weeks, please contact this office.      Dry Eye Dry eye, also called keratoconjunctivitis sicca, is dryness of the membranes surrounding the eye. It happens when there are not enough healthy, natural tears in the eyes. The eyes must remain moist at all times. A small amount of tears is constantly produced by the tear glands (lacrimal glands). These glands are located under the outside part of the upper eyelids. Dryness of the eyes can be a symptom of a variety of conditions, such as rheumatoid arthritis, lupus, or Sjgren syndrome. Dry eye may be mild to severe. What are the causes? This condition may be caused by:  Not making enough tears (aqueous tear-deficient dry eyes).  Tears evaporating from the eye too quickly (evaporative dry eyes). This is when there is an abnormality in the quality of your tears. This abnormality causes your tears to evaporate so quickly that the eye cannot be  kept moist.  What increases the risk? This condition is more likely to happen in:  Women, especially those who have gone through menopause.  People in dry climates.  People in dusty or smoky areas.  People who take certain medicines, such as: ? Anti-allergy medicines (antihistamines). ? Blood pressure medicines (antihypertensives). ? Birth control pills (oral contraceptives). ? Laxatives. ? Tranquilizers.  What are the signs or symptoms? Symptoms in the eyes may include:  Irritation.  Itchiness.  Redness.  Burning.  Inflammation of the eyelids.  Feeling as though something is stuck in the eye.  Light sensitivity.  Increased sensitivity and discomfort when wearing contact lenses, if this applies.  Vision that varies throughout the day.  Occasional excessive tearing.  How is this diagnosed? This condition is diagnosed based on your symptoms, your medical history, and an eye exam. Your health care provider may look at your eye using a microscope and may put dyes in your eye to check the health of the surface of your eye. You may have a tests, such as a test to evaluate your tear production (Schirmer test). During this test, a small strip of special paper is gently pressed into the inner corner of your eye. Your tear production is measured by how much of the paper is moistened by your tears during a set amount of time. You may be referred to a health care provider who specializes in eyes and eyesight (ophthalmologist). How is this treated? This condition is often treated at  home. Your health care provider may recommend eye drops, which are also called artificial tears. If your condition is severe, treatment may include:  Prescription eye drops.  Over-the-counter or prescription ointments to moisten your eyes.  Minor surgery to block tears from going into your nose.  Medicines to reduce inflammation of the eyelids.  Follow these instructions at home:  Take, use,  or apply over-the-counter and prescription medicines only as told by your health care provider. This includes eye drops.  If directed, apply a warm compress to your eyes to help reduce inflammation. Place a towel over your eyes and gently press the warm compress over your eyes for about 5 minutes, or as long as told by your health care provider.  If possible, avoid dry, drafty environments.  Use a humidifier at home to increase moisture in the air.  If you wear contact lenses, remove them regularly to give your eyes a break. Always remove contacts before sleeping.  Keep all follow-up visits as told by your health care provider. This is important. This includes yearly eye exams and vision tests. Contact a health care provider if:  You have eye pain.  You have pus-like fluid coming from your eye.  Your symptoms get worse or do not improve with treatment. Get help right away if:  Your vision suddenly changes. This information is not intended to replace advice given to you by your health care provider. Make sure you discuss any questions you have with your health care provider. Document Released: 03/03/2004 Document Revised: 09/22/2015 Document Reviewed: 02/09/2015 Elsevier Interactive Patient Education  2018 [employer].  Interstitial Cystitis Interstitial cystitis is a condition that causes inflammation of the bladder. The bladder is a hollow organ in the lower part of your abdomen. It stores urine after the urine is made by your kidneys. With interstitial cystitis, you may have pain in the bladder area. You may also have a frequent and urgent need to urinate. The severity of interstitial cystitis can vary from person to person. You may have flare-ups of the condition, and then it may go away for a while. For many people who have this condition, it becomes a long-term problem. What are the causes? The cause of this condition is not known. What increases the risk? This condition is more  likely to develop in women. What are the signs or symptoms? Symptoms of interstitial cystitis vary, and they can change over time. Symptoms may include:  Discomfort or pain in the bladder area. This can range from mild to severe. The pain may change in intensity as the bladder fills with urine or as it empties.  Pelvic pain.  An urgent need to urinate.  Frequent urination.  Pain during sexual intercourse.  Pinpoint bleeding on the bladder wall.  For women, the symptoms often get worse during menstruation. How is this diagnosed? This condition is diagnosed by evaluating your symptoms and ruling out other causes. A physical exam will be done. Various tests may be done to rule out other conditions. Common tests include:  Urine tests.  Cystoscopy. In this test, a tool that is like a very thin telescope is used to look into your bladder.  Biopsy. This involves taking a sample of tissue from the bladder wall to be examined under a microscope.  How is this treated? There is no cure for interstitial cystitis, but treatment methods are available to control your symptoms. Work closely with your health care provider to find the treatments that will be most  effective for you. Treatment options may include:  Medicines to relieve pain and to help reduce the number of times that you feel the need to urinate.  Bladder training. This involves learning ways to control when you urinate, such as: ? Urinating at scheduled times. ? Training yourself to delay urination. ? Doing exercises (Kegel exercises) to strengthen the muscles that control urine flow.  Lifestyle changes, such as changing your diet or taking steps to control stress.  Use of a device that provides electrical stimulation in order to reduce pain.  A procedure that stretches your bladder by filling it with air or fluid.  Surgery. This is rare. It is only done for extreme cases if other treatments do not help.  Follow these  instructions at home:  Take medicines only as directed by your health care provider.  Use bladder training techniques as directed. ? Keep a bladder diary to find out which foods, liquids, or activities make your symptoms worse. ? Use your bladder diary to schedule bathroom trips. If you are away from home, plan to be near a bathroom at each of your scheduled times. ? Make sure you urinate just before you leave the house and just before you go to bed.  Do Kegel exercises as directed by your health care provider.  Do not drink alcohol.  Do not use any tobacco products, including cigarettes, chewing tobacco, or electronic cigarettes. If you need help quitting, ask your health care provider.  Make dietary changes as directed by your health care provider. You may need to avoid spicy foods and foods that contain a high amount of potassium.  Limit your drinking of beverages that stimulate urination. These include soda, coffee, and tea.  Keep all follow-up visits as directed by your health care provider. This is important. Contact a health care provider if:  Your symptoms do not get better after treatment.  Your pain and discomfort are getting worse.  You have more frequent urges to urinate.  You have a fever. Get help right away if:  You are not able to control your bladder at all. This information is not intended to replace advice given to you by your health care provider. Make sure you discuss any questions you have with your health care provider. Document Released: 12/16/2003 Document Revised: 09/22/2015 Document Reviewed: 12/22/2013 Elsevier Interactive Patient Education  [employer].

## 2016-10-01 ENCOUNTER — Encounter: Payer: PPO | Admitting: Emergency Medicine

## 2016-10-02 NOTE — Progress Notes (Signed)
By signing my name below, I, [name], attest that this documentation has been prepared under the direction and in the presence of [name], MD.  Electronically Signed: [name], Medical Scribe. 10/02/16. 2:23 PM.  Subjective:    Patient ID: William [name], male    DOB: Oct 14, 1960, 56 y.o.   MRN: [id number]  Abdominal Pain  Associated symptoms include constipation, dysuria, headaches and nausea.  Urinary Tract Infection   Associated symptoms include nausea.   Chief Complaint  Patient presents with  . Abdominal Pain    LLQ 2 weeks ago   . Urinary Tract Infection    protien in urine    HPI Comments: William [name] is a 56 y.o. male who presents to Primary Care at [facility] complaining of intermittent LLQ abdominal pain. Reports associated sxs of worsening intermittent left sided HAs (no auras), dysuria, nausea onset yesterday (resolved), constipation, and sleep loss (lays on the sofa a few hours at night, but finds difficulty sleeping). Pt's HAs worsen with bright lights and loud noises and come with left eye watering - no aurora, or vision changes. He describes his HAs as pressure and throbbing, rating it 5-6/10 (if it was higher than 8 he would be in the doctors office), and lasting 20-30 mins through the day without taking something. Pt used to take imitrex but he had chest burning and fast heart rate while on it, and pt used oxygen to treat HAs in the past but it gave brief relief to his sxs. Pt used tylenol 2x a week and alka seltzer 2x a week without relief of his sxs. Pt stopped taking miralax since he doesn't think constipation was his issue. Pt stays away from caffeine. He is unsure if the stress from financial concerns he's experiencing is flaring his sxs. Pt would like to get a 24-hour urine kit. Pt was diagnosed with interstitial cystitis since 02/2010 with a follow-up with Dr. Diona Fanti 11/23/16. Pt spoke to Dr. Paulita Fujita and will set up an appt but has to check  his schedule yet.  Dry Eyes: Reports dry eyes and uses a wet warm cloth to his eyes for relief of his sxs.  Pt tried doxypenten, amytriplyline, gabapentin   Patient Active Problem List   Diagnosis Date Noted  . Pelvic pain in male 09/28/2016  . Abdominal pain, left lower quadrant 09/28/2016  . Isolated proteinuria without specific morphologic lesion 06/27/2016  . Testicular pain 06/27/2016  . Dry skin dermatitis 06/27/2016  . Acute left flank pain 06/27/2016  . GERD (gastroesophageal reflux disease) 10/13/2015  . Gastroparesis 10/13/2015  . Intractable chronic migraine without aura and without status migrainosus 12/15/2013  . Hyperlipidemia 04/04/2012  . MITRAL VALVE PROLAPSE 07/15/2009  . IRRITABLE BOWEL SYNDROME 07/15/2009   Past Medical History:  Diagnosis Date  . Allergy   . Anxiety   . Depression   . Gastroparesis   . Heart murmur   . Migraine headache   . Mitral valve prolapse    History reviewed. No pertinent surgical history. Allergies  Allergen Reactions  . Erythromycin Nausea And Vomiting and Anaphylaxis  . Hydrocodone-Acetaminophen Nausea And Vomiting  . Tramadol Nausea And Vomiting  . Cephalexin Diarrhea and Nausea And Vomiting  . Cephalosporins Diarrhea and Nausea And Vomiting  . Compazine [Prochlorperazine Edisylate]     uncontrollable shake - tremor  . Ketoconazole Itching  . Ketorolac Nausea And Vomiting  . Ketorolac Tromethamine     REACTION: Reaction not known  . Latex Itching  REACTION: Reaction not known  . Nalbuphine Other (See Comments)    States loss of consciousness REACTION: Reaction not known  . Oxycodone-Acetaminophen Other (See Comments) and Swelling  . Prednisone Nausea And Vomiting  . Prochlorperazine Edisylate     REACTION: shakes/tremors  . Topamax [Topiramate] Diarrhea and Nausea And Vomiting  . Cefdinir Diarrhea, Nausea And Vomiting and Rash  . Neosporin [Neomycin-Bacitracin Zn-Polymyx] Rash  . Oxycodone-Acetaminophen Other  (See Comments)   Prior to Admission medications   Medication Sig Start Date End Date Taking? Authorizing Provider  ondansetron (ZOFRAN) 4 MG tablet Take 1 tablet (4 mg total) by mouth every 8 (eight) hours as needed for nausea or vomiting. 09/28/16  Yes Sagardia, Ines Bloomer, MD  Vitamin D, Ergocalciferol, (DRISDOL) 50000 units CAPS capsule Take 1 capsule (50,000 Units total) by mouth every 7 (seven) days. 05/05/16  Yes Shawnee Knapp, MD   Social History   Social History  . Marital status: Married    Spouse name: N/A  . Number of children: N/A  . Years of education: N/A   Occupational History  . Not on file.   Social History Main Topics  . Smoking status: Never Smoker  . Smokeless tobacco: Never Used  . Alcohol use No  . Drug use: No  . Sexual activity: Yes    Partners: Female   Other Topics Concern  . Not on file   Social History Narrative  . No narrative on file   Review of Systems  Gastrointestinal: Positive for abdominal pain, constipation and nausea.  Genitourinary: Positive for dysuria.  Neurological: Positive for headaches.  Psychiatric/Behavioral: Positive for sleep disturbance.   Objective:  Physical Exam  Vitals:   09/29/16 1357  BP: 114/63  Pulse: 97  Resp: 16  Temp: 97.7 F (36.5 C)  TempSrc: Oral  SpO2: 97%  Weight: 145 lb 2 oz (65.8 kg)  Height: 5' 10" (1.778 m)  Body mass index is 20.82 kg/m. Assessment & Plan:   1. Nonintractable episodic headache, unspecified headache type   2. Isolated proteinuria without specific morphologic lesion   3. Interstitial cystitis   4. Dry eye     Orders Placed This Encounter  Procedures  . Protein, urine, 24 hour    Standing Status:   Future    Standing Expiration Date:   09/29/2017  . Creatinine, Urine, 24 Hour    Standing Status:   Future    Standing Expiration Date:   09/29/2017    Meds ordered this encounter  Medications  . butalbital-acetaminophen-caffeine (FIORICET, ESGIC) 50-325-40 MG tablet    Sig:  Take 1-2 tablets by mouth every 4 (four) hours as needed for headache.    Dispense:  40 tablet    Refill:  0  . cycloSPORINE (RESTASIS) 0.05 % ophthalmic emulsion    Sig: Place 1 drop into both eyes 2 (two) times daily.    Dispense:  5.5 mL    Refill:  1    I personally performed the services described in this documentation, which was scribed in my presence. The recorded information has been reviewed and considered, and addended by me as needed.   [name], M.D.  Primary Care at [facility] [address] [place], [place] [address] [phone] phone [phone] fax  10/02/16 12:02 AM

## 2016-10-03 DIAGNOSIS — R800 Isolated proteinuria: Secondary | ICD-10-CM | POA: Diagnosis not present

## 2016-10-03 NOTE — Addendum Note (Signed)
Addended by: MCNEILL, Lockie Bothun A on: 10/03/2016 12:32 PM   Modules accepted: Orders

## 2016-10-04 ENCOUNTER — Ambulatory Visit: Payer: PPO | Admitting: Family Medicine

## 2016-10-04 LAB — PROTEIN, URINE, 24 HOUR
PROTEIN UR: 18.6 mg/dL
Protein, 24H Urine: 214 mg/24 hr — ABNORMAL HIGH (ref 30–150)

## 2016-10-04 LAB — CREATININE, URINE, 24 HOUR
CREATININE 24H UR: 1051 mg/(24.h) (ref 1000–2000)
Creatinine, Urine: 91.4 mg/dL

## 2016-10-13 ENCOUNTER — Ambulatory Visit: Payer: PPO | Admitting: Family Medicine

## 2016-10-17 ENCOUNTER — Ambulatory Visit (INDEPENDENT_AMBULATORY_CARE_PROVIDER_SITE_OTHER): Payer: PPO | Admitting: Family Medicine

## 2016-10-17 ENCOUNTER — Encounter: Payer: Self-pay | Admitting: Family Medicine

## 2016-10-17 VITALS — BP 100/62 | HR 83 | Temp 98.0°F | Resp 18 | Ht 70.08 in | Wt 143.0 lb

## 2016-10-17 DIAGNOSIS — R1032 Left lower quadrant pain: Secondary | ICD-10-CM | POA: Diagnosis not present

## 2016-10-17 DIAGNOSIS — R3129 Other microscopic hematuria: Secondary | ICD-10-CM | POA: Diagnosis not present

## 2016-10-17 DIAGNOSIS — R110 Nausea: Secondary | ICD-10-CM

## 2016-10-17 MED ORDER — TRAMADOL HCL 50 MG PO TABS: 50.0000 mg | ORAL_TABLET | Freq: Three times a day (TID) | ORAL | 0 refills | Status: DC | PRN

## 2016-10-17 NOTE — Progress Notes (Signed)
William [name] is a 56 y.o. male who presents to Primary Care at [facility] today for LLQ abdominal pain:  1.  LLQ abdominal pain:  And flank pain.  Extends into his Left testicle.  Has been present intermittently since August of 2017.  Has had extensive w/u for this, including appts with Urology in [facility], upcoming GI appt for colonoscopy, multiple PCP visits.  Had U/S which showed possible Left-sided hernia, but this has never been felt clinically.  Pain for past several weeks is now constant rather than intermittent.  Describes dull ache and occasional sharp stabbing.  No change in bowel habits.  Diagnosed 2011 with interstitial cystitis but no current dysuria, nocturia, increased urinary frequency or urgency.    Also with what appears to be chronic hematuria for months now.  Creatinine has always been good.  No LE edema.  Has had documented 12 lb weight loss this year.  Decreased appetite.  Wife (present today) complains of his increasing malaise and pain that limits him from doing even minor tasks around the house. She is worried there is something else going on.    No discharge.  No rash.  No LE edema.  No dyspnea/chest pain. Moving bowels/flatus well.  Chronic mild nausea.  ROS as above.    PMH reviewed. Patient is a nonsmoker.   Past Medical History:  Diagnosis Date  . Allergy   . Anxiety   . Depression   . Gastroparesis   . Heart murmur   . Migraine headache   . Mitral valve prolapse    History reviewed. No pertinent surgical history.  Medications reviewed. Current Outpatient Prescriptions  Medication Sig Dispense Refill  . butalbital-acetaminophen-caffeine (FIORICET, ESGIC) 50-325-40 MG tablet Take 1-2 tablets by mouth every 4 (four) hours as needed for headache. 40 tablet 0  . ondansetron (ZOFRAN) 4 MG tablet Take 1 tablet (4 mg total) by mouth every 8 (eight) hours as needed for nausea or vomiting. 20 tablet 0  . Vitamin D, Ergocalciferol, (DRISDOL) 50000 units CAPS  capsule Take 1 capsule (50,000 Units total) by mouth every 7 (seven) days. 24 capsule 0  . traMADol (ULTRAM) 50 MG tablet Take 1 tablet (50 mg total) by mouth every 8 (eight) hours as needed. 25 tablet 0   No current facility-administered medications for this visit.      Physical Exam:  BP 100/62   Pulse 83   Temp 98 F (36.7 C) (Oral)   Resp 18   Ht 5' 10.08" (1.78 m)   Wt 143 lb (64.9 kg)   SpO2 97%   BMI 20.47 kg/m  Gen:  Alert, cooperative patient who appears stated age in no acute distress.  Vital signs reviewed.  Thin male.  Sallow appearance.   HEENT: EOMI,  MMM. Pulm:  Clear to auscultation bilaterally with good air movement.  No wheezes or rales noted.   Cardiac:  Regular rate and rhythm without murmur auscultated.  Good S1/S2. Abd:  Soft/nondistended.  TTP along entire Left flank and abdomen.  Worse in LLQ/inguinal area.  Mild LUQ and flank.  Moderate with deep palpation LLQ/inguinal area.  GU:  Circumcised male.  Normal external genitalia.  No lesions noted.  Nontender scrotum and testes BL.  Does have tenderness with palpation along inguinal canal on the Left.  No palpable bulge/mass/hernia noted.  No penile discharge.   Exts: Non edematous BL  LE, warm and well perfused.   Assessment and Plan:  1.  LLQ pain:  -  broad differential -- though he has had fairly extensive w/u for this.  No clear hernia on exam.  Labs all negative earlier this month.  CT scan normal back in January.  Nothing on exam warrants further imaging today.  -  Pain extends up through left abdomen, making this less likely.  Moving bowel well, no evidence SBO/other obstruction.  No evidence of diverticulitis or other infection.   - does have chronic mild nausea - unclear if his LLQ is related to hematuria.  Think he would benefit from cystocscopy due to chronic hematuria -- especially as with concomittant weight loss over the past year and generalized malaise.   - has appt with Urology in July.  Agree  with keeping this appt - Return if unable to take PO or pain becomes severe.  In meantime, tylenol is only thing he is taking for pain and not helping.  Tramadol at higher doses has caused nausea previously, but he would like to try 25 mg (1/2 pill) for something for pain.  See AVS  2.  Hematuria: - as above.  Again, cystoscopy if urology deems necessary - Normal creatinine/kidney function.  Patient tells me he saw a nephrologist in Feb but hasn't returned to be seen.   - Urology appt as above.

## 2016-10-17 NOTE — Patient Instructions (Addendum)
Try the Tramadol for pain relief.  Cut the pill in half.  If you do ok with this, you can try a whole pill.  Use the Zofran to prevent nausea with the Tramadol.  Take it with some food.  I think following up with the Urologist is the best plan.    It was good to see you today    IF you received an x-ray today, you will receive an invoice from [facility]. Please contact [facility] at [phone] with questions or concerns regarding your invoice.   IF you received labwork today, you will receive an invoice from [facility]. Please contact [name] at [phone] with questions or concerns regarding your invoice.   Our billing staff will not be able to assist you with questions regarding bills from these companies.  You will be contacted with the lab results as soon as they are available. The fastest way to get your results is to activate your [name] account. Instructions are located on the last page of this paperwork. If you have not heard from [place] regarding the results in 2 weeks, please contact this office.

## 2016-10-19 ENCOUNTER — Ambulatory Visit
Admission: RE | Admit: 2016-10-19 | Discharge: 2016-10-19 | Disposition: A | Discharge status: Home / Self Care | Payer: PPO | Source: Ambulatory Visit | Attending: Physician Assistant | Admitting: Physician Assistant

## 2016-10-19 ENCOUNTER — Other Ambulatory Visit: Payer: Self-pay | Admitting: Physician Assistant

## 2016-10-19 DIAGNOSIS — R109 Unspecified abdominal pain: Secondary | ICD-10-CM

## 2016-10-19 DIAGNOSIS — R1032 Left lower quadrant pain: Secondary | ICD-10-CM | POA: Diagnosis not present

## 2016-10-19 MED ORDER — IOPAMIDOL (ISOVUE-300) INJECTION 61%
100.0000 mL | Freq: Once | INTRAVENOUS | Status: AC | PRN
  Administered 2016-10-19: 100 mL via INTRAVENOUS

## 2016-10-20 ENCOUNTER — Ambulatory Visit (INDEPENDENT_AMBULATORY_CARE_PROVIDER_SITE_OTHER): Payer: PPO | Admitting: Family Medicine

## 2016-10-20 VITALS — BP 103/66 | HR 95 | Temp 98.6°F | Resp 16 | Ht 70.0 in | Wt 145.0 lb

## 2016-10-20 DIAGNOSIS — R801 Persistent proteinuria, unspecified: Secondary | ICD-10-CM | POA: Diagnosis not present

## 2016-10-20 DIAGNOSIS — R1032 Left lower quadrant pain: Secondary | ICD-10-CM

## 2016-10-20 DIAGNOSIS — G8929 Other chronic pain: Secondary | ICD-10-CM

## 2016-10-20 DIAGNOSIS — R109 Unspecified abdominal pain: Secondary | ICD-10-CM

## 2016-10-20 DIAGNOSIS — R3129 Other microscopic hematuria: Secondary | ICD-10-CM

## 2016-10-20 DIAGNOSIS — N50812 Left testicular pain: Secondary | ICD-10-CM | POA: Diagnosis not present

## 2016-10-20 LAB — POC MICROSCOPIC URINALYSIS (UMFC)

## 2016-10-20 LAB — POCT URINALYSIS DIP (MANUAL ENTRY)
GLUCOSE UA: NEGATIVE mg/dL
Leukocytes, UA: NEGATIVE
NITRITE UA: NEGATIVE
PH UA: 6 (ref 5.0–8.0)
Protein Ur, POC: >=300 mg/dL — AB
SPEC GRAV UA: 1.02 (ref 1.010–1.025)
UROBILINOGEN UA: 0.2 U/dL

## 2016-10-20 NOTE — Progress Notes (Addendum)
Subjective:  By signing my name below, I, [name], attest that this documentation has been prepared under the direction and in the presence of [name], MD. Electronically Signed: [name], [place]. 10/20/2016 , 9:20 AM .  Patient was seen in Room 8 .   Patient ID: William [name], male    DOB: January 04, 1961, 56 y.o.   MRN: [id number] Chief Complaint  Patient presents with  . Follow-up    LLQ pain - pt had CT scan yesterday    HPI William [name] is a 56 y.o. male who presents to Primary Care at [facility] for follow up of LLQ pain. He was seen 3 days ago by Dr. Mingo Amber for LLQ pain, and was prescribed low dose of tramadol. He was referred to GI, Dr. Paulita Fujita, for colonoscopy, but they had to have CT abdomen, which was normal. He is scheduled for colonoscopy on Tuesday (in 3 days). He rates his pain about 6-7/10 right now, worst at 8/10. His wife (present today) notes that she's noticed patient getting progressively worse over past few months.   He still hasn't seen Dr. Diona Fanti, but has an appointment on July 27th. He is planned for physical therapy for scrotal pain with [facility].   His last PSA was 1.1 2 years ago, suspect this has been redrawn at urologist, but unsure. Dr. Felipa Eth thought patient had epididymis and treated with doxycycline, then Levaquin, and then meloxicam- all without benefit. He had scrotal ultrasound in Aug 2017, which showed small left and right hydrocele, and left epididymal cyst.   Past Medical History:  Diagnosis Date  . Allergy   . Anxiety   . Depression   . Gastroparesis   . Heart murmur   . Migraine headache   . Mitral valve prolapse    Prior to Admission medications   Medication Sig Start Date End Date Taking? Authorizing Provider  butalbital-acetaminophen-caffeine (FIORICET, ESGIC) [phone] MG tablet Take 1-2 tablets by mouth every 4 (four) hours as needed for headache. 09/29/16 09/29/17 Yes Shawnee Knapp, MD  ondansetron (ZOFRAN) 4 MG  tablet Take 1 tablet (4 mg total) by mouth every 8 (eight) hours as needed for nausea or vomiting. 09/28/16  Yes Sagardia, Ines Bloomer, MD  Vitamin D, Ergocalciferol, (DRISDOL) 50000 units CAPS capsule Take 1 capsule (50,000 Units total) by mouth every 7 (seven) days. 05/05/16  Yes Shawnee Knapp, MD  traMADol (ULTRAM) 50 MG tablet Take 1 tablet (50 mg total) by mouth every 8 (eight) hours as needed. Patient not taking: Reported on 10/20/2016 10/17/16   Alveda Reasons, MD   Allergies  Allergen Reactions  . Erythromycin Nausea And Vomiting and Anaphylaxis  . Hydrocodone-Acetaminophen Nausea And Vomiting  . Tramadol Nausea And Vomiting  . Cephalexin Diarrhea and Nausea And Vomiting  . Cephalosporins Diarrhea and Nausea And Vomiting  . Compazine [Prochlorperazine Edisylate]     uncontrollable shake - tremor  . Ketoconazole Itching  . Ketorolac Nausea And Vomiting  . Ketorolac Tromethamine     REACTION: Reaction not known  . Latex Itching    REACTION: Reaction not known  . Nalbuphine Other (See Comments)    States loss of consciousness REACTION: Reaction not known  . Oxycodone-Acetaminophen Other (See Comments) and Swelling  . Prednisone Nausea And Vomiting  . Prochlorperazine Edisylate     REACTION: shakes/tremors  . Topamax [Topiramate] Diarrhea and Nausea And Vomiting  . Cefdinir Diarrhea, Nausea And Vomiting and Rash  . Neosporin [Neomycin-Bacitracin Zn-Polymyx] Rash  .  Oxycodone-Acetaminophen Other (See Comments)   Review of Systems  Constitutional: Negative for fatigue and unexpected weight change.  Eyes: Negative for visual disturbance.  Respiratory: Negative for cough, chest tightness and shortness of breath.   Cardiovascular: Negative for chest pain, palpitations and leg swelling.  Gastrointestinal: Positive for abdominal pain. Negative for blood in stool.  Neurological: Negative for dizziness, light-headedness and headaches.       Objective:   Physical Exam    Constitutional: He is oriented to person, place, and time. He appears well-developed and well-nourished. No distress.  HENT:  Head: Normocephalic and atraumatic.  Eyes: EOM are normal. Pupils are equal, round, and reactive to light.  Neck: Neck supple.  Cardiovascular: Normal rate, regular rhythm, S1 normal, S2 normal and normal heart sounds.   No murmur heard. Pulmonary/Chest: Effort normal and breath sounds normal. No respiratory distress. He has no wheezes.  Abdominal: Soft. Bowel sounds are normal. He exhibits no distension. There is tenderness (throughout, worse in LLQ, best in LUQ; no referred pain) in the left lower quadrant. There is CVA tenderness (left). There is no rebound and no guarding.  Musculoskeletal: Normal range of motion.  Neurological: He is alert and oriented to person, place, and time.  Skin: Skin is warm and dry.  Psychiatric: He has a normal mood and affect. His behavior is normal.  Nursing note and vitals reviewed.   BP 103/66   Pulse 95   Temp 98.6 F (37 C) (Oral)   Resp 16   Ht 5\' 10"  (1.778 m)   Wt 145 lb (65.8 kg)   SpO2 98%   BMI 20.81 kg/m     Assessment & Plan:   1. Abdominal pain, left lower quadrant   2. Microscopic hematuria   3. Persistent proteinuria   4. Left flank pain, chronic   5. Left testicular pain     Orders Placed This Encounter  Procedures  . Urine Culture  . Ambulatory referral to Physical Therapy    Referral Priority:   Routine    Referral Type:   Physical Medicine    Referral Reason:   Specialty Services Required    Requested Specialty:   Physical Therapy    Number of Visits Requested:   1  . Care order/instruction:    Scheduling Instructions:     Complete orders, AVS and go.  [name] POCT urinalysis dipstick  . POCT Microscopic Urinalysis ([facility])    Over 40 min spent in face-to-face evaluation of and consultation with patient and coordination of care.  Over 50% of this time was spent counseling this patient.  I  personally performed the services described in this documentation, which was scribed in my presence. The recorded information has been reviewed and considered, and addended by me as needed.   [name], M.D.  Primary Care at [facility] [address] [place], [place] [address] [phone] phone [phone] fax  10/22/16 10:36 PM

## 2016-10-20 NOTE — Patient Instructions (Signed)
     IF you received an x-ray today, you will receive an invoice from [facility]. Please contact [facility] at [phone] with questions or concerns regarding your invoice.   IF you received labwork today, you will receive an invoice from [name]. Please contact [name] at [phone] with questions or concerns regarding your invoice.   Our billing staff will not be able to assist you with questions regarding bills from these companies.  You will be contacted with the lab results as soon as they are available. The fastest way to get your results is to activate your [name] account. Instructions are located on the last page of this paperwork. If you have not heard from us regarding the results in 2 weeks, please contact this office.     

## 2016-10-20 NOTE — Progress Notes (Signed)
.  nn

## 2016-10-21 LAB — URINE CULTURE: ORGANISM ID, BACTERIA: NO GROWTH

## 2016-10-24 LAB — CYTOLOGY, URINE

## 2016-10-25 ENCOUNTER — Other Ambulatory Visit: Payer: Self-pay | Admitting: Gastroenterology

## 2016-10-25 ENCOUNTER — Ambulatory Visit
Admission: RE | Admit: 2016-10-25 | Discharge: 2016-10-25 | Disposition: A | Discharge status: Home / Self Care | Payer: PPO | Source: Ambulatory Visit | Attending: Gastroenterology | Admitting: Gastroenterology

## 2016-10-25 DIAGNOSIS — R109 Unspecified abdominal pain: Secondary | ICD-10-CM | POA: Diagnosis not present

## 2016-10-25 DIAGNOSIS — K5900 Constipation, unspecified: Secondary | ICD-10-CM

## 2016-10-25 DIAGNOSIS — R1032 Left lower quadrant pain: Secondary | ICD-10-CM | POA: Diagnosis not present

## 2016-10-26 ENCOUNTER — Telehealth: Payer: Self-pay | Admitting: Family Medicine

## 2016-10-26 NOTE — Telephone Encounter (Signed)
Pt is needing to let Dr. Brigitte Pulse know that the colonoscopy that was scheduled got cancelled due to the prep solution did not work and the provider ordered  movi pre-powder and the patient is wanting to know if this is ok due the kidney issues pt has   Best number [phone]

## 2016-11-07 ENCOUNTER — Ambulatory Visit: Payer: PPO | Admitting: Physical Therapy

## 2016-11-20 NOTE — Telephone Encounter (Signed)
THis note was never sent anywhere so never got addressed. It is fine to use the movi-prep. PLease call pt and make sure he proceeded and give my apologies. I'm sure he saw his last urine labs online but they looked great - no abnormal cells were seen on the latest - very reassuring!

## 2016-11-21 NOTE — Telephone Encounter (Signed)
Pt has appt in August with GI to discuss colonoscopy and reschedule.

## 2016-11-23 DIAGNOSIS — R311 Benign essential microscopic hematuria: Secondary | ICD-10-CM | POA: Diagnosis not present

## 2016-11-23 DIAGNOSIS — R102 Pelvic and perineal pain: Secondary | ICD-10-CM | POA: Diagnosis not present

## 2016-11-23 DIAGNOSIS — IMO0001 Reserved for inherently not codable concepts without codable children: Secondary | ICD-10-CM | POA: Insufficient documentation

## 2016-11-24 ENCOUNTER — Ambulatory Visit: Payer: PPO | Admitting: Family Medicine

## 2016-11-30 ENCOUNTER — Encounter: Payer: Self-pay | Admitting: Family Medicine

## 2016-12-15 ENCOUNTER — Ambulatory Visit (INDEPENDENT_AMBULATORY_CARE_PROVIDER_SITE_OTHER): Payer: PPO | Admitting: Family Medicine

## 2016-12-15 ENCOUNTER — Encounter: Payer: Self-pay | Admitting: Family Medicine

## 2016-12-15 VITALS — BP 116/77 | HR 65 | Resp 16 | Ht 70.5 in | Wt 144.0 lb

## 2016-12-15 DIAGNOSIS — R457 State of emotional shock and stress, unspecified: Secondary | ICD-10-CM

## 2016-12-15 DIAGNOSIS — R801 Persistent proteinuria, unspecified: Secondary | ICD-10-CM

## 2016-12-15 DIAGNOSIS — M6289 Other specified disorders of muscle: Secondary | ICD-10-CM | POA: Diagnosis not present

## 2016-12-15 MED ORDER — TRAMADOL HCL 50 MG PO TABS: 50.0000 mg | ORAL_TABLET | Freq: Three times a day (TID) | ORAL | 2 refills | Status: DC | PRN

## 2016-12-15 NOTE — Patient Instructions (Addendum)
Have William [name] consider asking provider for a prescription for meloxicam    IF you received an x-ray today, you will receive an invoice from [name]. Please contact [name] at [phone] with questions or concerns regarding your invoice.   IF you received labwork today, you will receive an invoice from [name]. Please contact [name] at [phone] with questions or concerns regarding your invoice.   Our billing staff will not be able to assist you with questions regarding bills from these companies.  You will be contacted with the lab results as soon as they are available. The fastest way to get your results is to activate your [name] account. Instructions are located on the last page of this paperwork. If you have not heard from [place] regarding the results in 2 weeks, please contact this office.    The Male Pelvic Floor Muscles  The pelvic floor consists of several layers of muscles that cover the bottom of the pelvic cavity. These muscles have several distinct roles:  1. To support the pelvic organs, the bladder and colon within the pelvis. 2. To assist in stopping and starting the flow of urine or the passage of gas or stool. 3. To aid in sexual appreciation.    How to Locate the Pelvic Floor Muscles  The Urine Stop Test . At the midstream of your urine flow, squeeze the pelvic floor muscles. You should feel the sensation of the openings close and the muscles pulling the penis and anus up and in to the pelvic cavity.  If you have strong muscles you will slow or stop the stream of urine. . Try to stop or slow the flow of urine without tensing the muscles of your legs, buttocks. . Do this only to locate the muscles, not as a daily exercise. Feeling the Muscle . Place a fingertip on or into the rectal opening.  Contract and lift the muscles as though you were holding back gas or a bowel movement.   . You will feel your anal opening tighten and your penis  move slightly. Watching the Muscles Contract . Begin by lying on a flat surface.  Position yourself with your knees apart and bent with your head elevated and supported on several pillows.  Use a mirror to look at the anal opening and penis.  . Contract or tighten the muscles around the anal opening and watch for a puckering and lifting of the anus and slight movement of the penis.   . If you see a bulge of your anus this is an incorrect contraction and you should notify your health care provider for more instructions.   2007, [employer] Doc.12

## 2016-12-15 NOTE — Progress Notes (Signed)
By signing my name below, I, [name], attest that this documentation has been prepared under the direction and in the presence of [name], MD.  Electronically Signed: [name], Medical Scribe. 12/15/16. 1:41 PM.  Subjective:    Patient ID: William [name], male    DOB: [birth year], 56 y.o.   MRN: [id number]  HPI Chief Complaint  Patient presents with  . referral follow up    has seen Urology has questions about what Urologist has told him     HPI Comments: William [name] is a 56 y.o. male who presents to Primary Care at [facility] for care giver referral. Pelvic floor dysfunction and mild left inguinal herna painfully symptomatic, similar in 2007, but worse.  Care Taker: Pt is helping his mom and she plans on living here from [place]. Pt was starting to get depressed after helping his mom out.  Suprapubic: Pt saw Dr. Diona Fanti and was told he may have a hernia. Pt has been using tramadol sparingly for relief of his pain. Denies getting happy when taking tramadol, or becoming addicted to tramadol.  Colon CA Screening: Pt has been pushing back his colonoscopy to take care of his mom. Pt's gastroenterologist suggest his abdominal pain isn't from his hernia. He used MoviPrep for his 2nd prep since the first prep didn't work.  Patient Active Problem List   Diagnosis Date Noted  . Mild left inguinal hernia 11/23/2016  . Pelvic pain in male 09/28/2016  . Abdominal pain, left lower quadrant 09/28/2016  . Isolated proteinuria without specific morphologic lesion 06/27/2016  . Testicular pain 06/27/2016  . Dry skin dermatitis 06/27/2016  . Acute left flank pain 06/27/2016  . Pelvic floor dysfunction 04/01/2016  . GERD (gastroesophageal reflux disease) 10/13/2015  . Gastroparesis 10/13/2015  . Intractable chronic migraine without aura and without status migrainosus 12/15/2013  . Hyperlipidemia 04/04/2012  . MITRAL VALVE PROLAPSE 07/15/2009  . IRRITABLE BOWEL SYNDROME  07/15/2009   Past Medical History:  Diagnosis Date  . Allergy   . Anxiety   . Depression   . Gastroparesis   . Heart murmur   . Migraine headache   . Mitral valve prolapse    No past surgical history on file. Allergies  Allergen Reactions  . Erythromycin Nausea And Vomiting and Anaphylaxis  . Hydrocodone-Acetaminophen Nausea And Vomiting  . Tramadol Nausea And Vomiting  . Cephalexin Diarrhea and Nausea And Vomiting  . Cephalosporins Diarrhea and Nausea And Vomiting  . Compazine [Prochlorperazine Edisylate]     uncontrollable shake - tremor  . Ketoconazole Itching  . Ketorolac Nausea And Vomiting  . Ketorolac Tromethamine     REACTION: Reaction not known  . Latex Itching    REACTION: Reaction not known  . Nalbuphine Other (See Comments)    States loss of consciousness REACTION: Reaction not known  . Oxycodone-Acetaminophen Other (See Comments) and Swelling  . Prednisone Nausea And Vomiting  . Prochlorperazine Edisylate     REACTION: shakes/tremors  . Topamax [Topiramate] Diarrhea and Nausea And Vomiting  . Cefdinir Diarrhea, Nausea And Vomiting and Rash  . Neosporin [Neomycin-Bacitracin Zn-Polymyx] Rash  . Oxycodone-Acetaminophen Other (See Comments)   Prior to Admission medications   Medication Sig Start Date End Date Taking? Authorizing Provider  butalbital-acetaminophen-caffeine (FIORICET, ESGIC) [phone] MG tablet Take 1-2 tablets by mouth every 4 (four) hours as needed for headache. 09/29/16 09/29/17 Yes Shawnee Knapp, MD  traMADol (ULTRAM) 50 MG tablet Take 1 tablet (50 mg total) by mouth every 8 (eight)  hours as needed. 10/17/16  Yes Alveda Reasons, MD  ondansetron (ZOFRAN) 4 MG tablet Take 1 tablet (4 mg total) by mouth every 8 (eight) hours as needed for nausea or vomiting. Patient not taking: Reported on 12/15/2016 09/28/16   Horald Pollen, MD  Vitamin D, Ergocalciferol, (DRISDOL) 50000 units CAPS capsule Take 1 capsule (50,000 Units total) by mouth every 7  (seven) days. Patient not taking: Reported on 12/15/2016 05/05/16   Shawnee Knapp, MD   Family History  Problem Relation Age of Onset  . Hypertension Mother   . Hypertension Brother     Social History   Social History  . Marital status: Married    Spouse name: N/A  . Number of children: N/A  . Years of education: N/A   Occupational History  . Not on file.   Social History Main Topics  . Smoking status: Never Smoker  . Smokeless tobacco: Never Used  . Alcohol use No  . Drug use: No  . Sexual activity: Yes    Partners: Female   Other Topics Concern  . Not on file   Social History Narrative  . No narrative on file   Depression screen [facility] 2/9 10/17/2016 09/29/2016 09/28/2016 06/30/2016 06/28/2016  Decreased Interest 0 0 0 0 0  Down, Depressed, Hopeless 0 0 0 0 0  PHQ - 2 Score 0 0 0 0 0  Altered sleeping - 0 - - -  Tired, decreased energy - 0 - - -  Change in appetite - 0 - - -  Feeling bad or failure about yourself  - 0 - - -  Trouble concentrating - 0 - - -  Moving slowly or fidgety/restless - 0 - - -  Suicidal thoughts - 0 - - -  PHQ-9 Score - 0 - - -  Some recent data might be hidden    Review of Systems  Gastrointestinal: Positive for abdominal pain.  Musculoskeletal: Positive for back pain.  Psychiatric/Behavioral: Positive for dysphoric mood.   Objective:  Physical Exam  Constitutional: He appears well-developed and well-nourished. No distress.  HENT:  Head: Normocephalic and atraumatic.  Eyes: Conjunctivae are normal.  Neck: Neck supple.  Cardiovascular: Normal rate.   Pulmonary/Chest: Effort normal.  Neurological: He is alert.  Skin: Skin is warm and dry.  Psychiatric: He has a normal mood and affect. His behavior is normal.  Nursing note and vitals reviewed.   Vitals:   12/15/16 1335  BP: 116/77  Pulse: 65  Resp: 16  Weight: 144 lb (65.3 kg)  Height: 5' 10.5" (1.791 m)   Body mass index is 20.37 kg/m. Assessment & Plan:   1. Caregiver stress  syndrome   2. Pelvic floor dysfunction   3. Persistent proteinuria     Orders Placed This Encounter  Procedures  . Microalbumin/Creatinine Ratio, Urine    Standing Status:   Future    Standing Expiration Date:   12/15/2017  . Ambulatory referral to [facility]    Referral Priority:   Routine    Referral Type:   Consultation    Referral Reason:   Specialty Services Required    Number of Visits Requested:   1    Meds ordered this encounter  Medications  . traMADol (ULTRAM) 50 MG tablet    Sig: Take 1 tablet (50 mg total) by mouth every 8 (eight) hours as needed.    Dispense:  25 tablet    Refill:  2    I personally performed  the services described in this documentation, which was scribed in my presence. The recorded information has been reviewed and considered, and addended by me as needed.   [name], M.D.  Primary Care at [facility] [address] [place], [place] [address] [phone] phone [phone] fax  12/17/16 11:53 PM

## 2016-12-20 ENCOUNTER — Telehealth: Payer: Self-pay

## 2016-12-20 DIAGNOSIS — Z1211 Encounter for screening for malignant neoplasm of colon: Secondary | ICD-10-CM | POA: Diagnosis not present

## 2016-12-20 DIAGNOSIS — R1032 Left lower quadrant pain: Secondary | ICD-10-CM | POA: Diagnosis not present

## 2016-12-20 NOTE — Telephone Encounter (Signed)
Called pt to schedule awv. Pt was driving and could not look at calender and will call back to schedule. -nr

## 2016-12-25 ENCOUNTER — Ambulatory Visit (INDEPENDENT_AMBULATORY_CARE_PROVIDER_SITE_OTHER): Payer: PPO | Admitting: Family Medicine

## 2016-12-25 DIAGNOSIS — R801 Persistent proteinuria, unspecified: Secondary | ICD-10-CM | POA: Diagnosis not present

## 2016-12-25 DIAGNOSIS — R809 Proteinuria, unspecified: Secondary | ICD-10-CM

## 2016-12-25 LAB — POCT URINALYSIS DIPSTICK
Bilirubin, UA: NEGATIVE
Glucose, UA: NEGATIVE
Ketones, UA: NEGATIVE
Leukocytes, UA: NEGATIVE
Nitrite, UA: NEGATIVE
Protein, UA: 100
Spec Grav, UA: 1.015 (ref 1.010–1.025)
Urobilinogen, UA: 1 E.U./dL
pH, UA: 7 (ref 5.0–8.0)

## 2016-12-26 LAB — MICROALBUMIN / CREATININE URINE RATIO
CREATININE, UR: 139.1 mg/dL
Microalb/Creat Ratio: 106.7 mg/g creat — ABNORMAL HIGH (ref 0.0–30.0)
Microalbumin, Urine: 148.4 ug/mL

## 2016-12-26 NOTE — Progress Notes (Signed)
Lab only visit 

## 2016-12-29 ENCOUNTER — Ambulatory Visit (INDEPENDENT_AMBULATORY_CARE_PROVIDER_SITE_OTHER): Payer: PPO | Admitting: Family Medicine

## 2016-12-29 ENCOUNTER — Encounter: Payer: Self-pay | Admitting: Family Medicine

## 2016-12-29 VITALS — BP 106/71 | HR 89 | Temp 98.4°F | Resp 17 | Ht 70.5 in | Wt 141.0 lb

## 2016-12-29 DIAGNOSIS — R5382 Chronic fatigue, unspecified: Secondary | ICD-10-CM

## 2016-12-29 DIAGNOSIS — R1032 Left lower quadrant pain: Secondary | ICD-10-CM | POA: Diagnosis not present

## 2016-12-29 DIAGNOSIS — E7800 Pure hypercholesterolemia, unspecified: Secondary | ICD-10-CM

## 2016-12-29 DIAGNOSIS — M6289 Other specified disorders of muscle: Secondary | ICD-10-CM

## 2016-12-29 DIAGNOSIS — G43719 Chronic migraine without aura, intractable, without status migrainosus: Secondary | ICD-10-CM | POA: Diagnosis not present

## 2016-12-29 DIAGNOSIS — J0111 Acute recurrent frontal sinusitis: Secondary | ICD-10-CM | POA: Diagnosis not present

## 2016-12-29 DIAGNOSIS — E559 Vitamin D deficiency, unspecified: Secondary | ICD-10-CM

## 2016-12-29 DIAGNOSIS — R800 Isolated proteinuria: Secondary | ICD-10-CM

## 2016-12-29 DIAGNOSIS — N50812 Left testicular pain: Secondary | ICD-10-CM | POA: Diagnosis not present

## 2016-12-29 LAB — POCT CBC
Granulocyte percent: 78.8 %G (ref 37–80)
HCT, POC: 45.8 % (ref 43.5–53.7)
Hemoglobin: 15.1 g/dL (ref 14.1–18.1)
LYMPH, POC: 2 (ref 0.6–3.4)
MCH, POC: 31.3 pg — AB (ref 27–31.2)
MCHC: 33.1 g/dL (ref 31.8–35.4)
MCV: 94.5 fL (ref 80–97)
MID (CBC): 0.3 (ref 0–0.9)
MPV: 7.5 fL (ref 0–99.8)
POC GRANULOCYTE: 8.5 — AB (ref 2–6.9)
POC LYMPH %: 18.2 % (ref 10–50)
POC MID %: 3 % (ref 0–12)
Platelet Count, POC: 273 10*3/uL (ref 142–424)
RBC: 4.84 M/uL (ref 4.69–6.13)
RDW, POC: 13.8 %
WBC: 10.8 10*3/uL — AB (ref 4.6–10.2)

## 2016-12-29 LAB — POCT URINALYSIS DIP (MANUAL ENTRY)
BILIRUBIN UA: NEGATIVE
GLUCOSE UA: NEGATIVE mg/dL
Ketones, POC UA: NEGATIVE mg/dL
LEUKOCYTES UA: NEGATIVE
NITRITE UA: NEGATIVE
Protein Ur, POC: 100 mg/dL — AB
Spec Grav, UA: 1.015 (ref 1.010–1.025)
UROBILINOGEN UA: 0.2 U/dL
pH, UA: 6.5 (ref 5.0–8.0)

## 2016-12-29 LAB — POC MICROSCOPIC URINALYSIS (UMFC): Mucus: ABSENT

## 2016-12-29 LAB — POCT SEDIMENTATION RATE: POCT SED RATE: 6 mm/h (ref 0–22)

## 2016-12-29 MED ORDER — METHYLPREDNISOLONE ACETATE 80 MG/ML IJ SUSP
80.0000 mg | Freq: Once | INTRAMUSCULAR | Status: AC
  Administered 2016-12-29: 80 mg via INTRAMUSCULAR

## 2016-12-29 MED ORDER — AMOXICILLIN 500 MG PO CAPS: 1000.0000 mg | ORAL_CAPSULE | Freq: Two times a day (BID) | ORAL | 0 refills | Status: DC

## 2016-12-29 NOTE — Progress Notes (Addendum)
Subjective:  By signing my name below, I, [name], attest that this documentation has been prepared under the direction and in the presence of [name], MD. Electronically Signed: [name], [place]. 12/29/2016 , 9:32 AM .  Patient was seen in Room 11 .   Patient ID: William [name], male    DOB: 1961/02/29, 56 y.o.   MRN: [id number] Chief Complaint  Patient presents with  . Follow-up    fatigue, high protein in urine   HPI William [name] is a 56 y.o. male who presents to Primary Care at [facility] for follow up. He is fasting today.   High proteinuria He had developed proteinuria, mild to moderate range approximately 1 year ago. He's seen 2 different urologists and nephrologists with no abnormality or etiology found. His levels has been ranging in the 300s. He came in 4 days ago for recheck, and was down to 106.7.   Fatigue Patient states he's been really fatigue and sleeping a lot of hours. He would be doing something, and then would rest for roughly 4 hours. He is able to wake up a few hours later, and return to sleep without difficulty. Yesterday, he fell asleep at 7:00PM until 11:00PM. He was able to wake up and stay awake for about 20-30 minutes; then returned to sleep from 12:00AM to 6:00AM. This has been ongoing for 2-3 months now. His last Vitamin D level done 8 months ago was at 10.   Headache He previously reported worsening intermittent left sided headaches without auras. He described his headaches as pressure and throbbing. He mentions his headaches have returned, but starts from his right temple and radiates to the back of his head. He was previously prescribed Fioricet but hasn't taken it. He was also prescribed tramadol last visit as needed for pain, and has been taking that for past week without relief. He also usually has sinus headaches around this time of the year and tried saline spray without relief. He denies headache in office.   Past Medical  History:  Diagnosis Date  . Allergy   . Anxiety   . Depression   . Gastroparesis   . Heart murmur   . Migraine headache   . Mitral valve prolapse    Prior to Admission medications   Medication Sig Start Date End Date Taking? Authorizing Provider  butalbital-acetaminophen-caffeine (FIORICET, ESGIC) [phone] MG tablet Take 1-2 tablets by mouth every 4 (four) hours as needed for headache. 09/29/16 09/29/17  Shawnee Knapp, MD  ondansetron (ZOFRAN) 4 MG tablet Take 1 tablet (4 mg total) by mouth every 8 (eight) hours as needed for nausea or vomiting. Patient not taking: Reported on 12/15/2016 09/28/16   Horald Pollen, MD  traMADol (ULTRAM) 50 MG tablet Take 1 tablet (50 mg total) by mouth every 8 (eight) hours as needed. 12/15/16   Shawnee Knapp, MD  Vitamin D, Ergocalciferol, (DRISDOL) 50000 units CAPS capsule Take 1 capsule (50,000 Units total) by mouth every 7 (seven) days. Patient not taking: Reported on 12/15/2016 05/05/16   Shawnee Knapp, MD   Allergies  Allergen Reactions  . Erythromycin Nausea And Vomiting and Anaphylaxis  . Hydrocodone-Acetaminophen Nausea And Vomiting  . Tramadol Nausea And Vomiting  . Cephalexin Diarrhea and Nausea And Vomiting  . Cephalosporins Diarrhea and Nausea And Vomiting  . Compazine [Prochlorperazine Edisylate]     uncontrollable shake - tremor  . Ketoconazole Itching  . Ketorolac Nausea And Vomiting  . Ketorolac Tromethamine  REACTION: Reaction not known  . Latex Itching    REACTION: Reaction not known  . Nalbuphine Other (See Comments)    States loss of consciousness REACTION: Reaction not known  . Oxycodone-Acetaminophen Other (See Comments) and Swelling  . Prednisone Nausea And Vomiting  . Prochlorperazine Edisylate     REACTION: shakes/tremors  . Topamax [Topiramate] Diarrhea and Nausea And Vomiting  . Cefdinir Diarrhea, Nausea And Vomiting and Rash  . Neosporin [Neomycin-Bacitracin Zn-Polymyx] Rash  . Oxycodone-Acetaminophen Other (See  Comments)   No past surgical history on file. Family History  Problem Relation Age of Onset  . Hypertension Mother   . Hypertension Brother    Social History   Social History  . Marital status: Married    Spouse name: N/A  . Number of children: N/A  . Years of education: N/A   Social History Main Topics  . Smoking status: Never Smoker  . Smokeless tobacco: Never Used  . Alcohol use No  . Drug use: No  . Sexual activity: Yes    Partners: Female   Other Topics Concern  . None   Social History Narrative  . None   Depression screen [facility] 2/9 12/29/2016 10/17/2016 09/29/2016 09/28/2016 06/30/2016  Decreased Interest 0 0 0 0 0  Down, Depressed, Hopeless 0 0 0 0 0  PHQ - 2 Score 0 0 0 0 0  Altered sleeping - - 0 - -  Tired, decreased energy - - 0 - -  Change in appetite - - 0 - -  Feeling bad or failure about yourself  - - 0 - -  Trouble concentrating - - 0 - -  Moving slowly or fidgety/restless - - 0 - -  Suicidal thoughts - - 0 - -  PHQ-9 Score - - 0 - -  Some recent data might be hidden    Review of Systems  Constitutional: Positive for fatigue. Negative for unexpected weight change.  Eyes: Negative for visual disturbance.  Respiratory: Negative for cough, chest tightness and shortness of breath.   Cardiovascular: Negative for chest pain, palpitations and leg swelling.  Gastrointestinal: Negative for abdominal pain and blood in stool.  Allergic/Immunologic: Positive for environmental allergies.  Neurological: Positive for headaches. Negative for dizziness and light-headedness.       Objective:   Physical Exam  Constitutional: He is oriented to person, place, and time. He appears well-developed and well-nourished. No distress.  HENT:  Head: Normocephalic and atraumatic.  Right Ear: Tympanic membrane normal.  Left Ear: Tympanic membrane normal.  Nares with pale and boggy mucosa Oropharynx with significant clear postnasal drip  Eyes: Pupils are equal, round, and  reactive to light. EOM are normal.  Neck: Neck supple.  Cardiovascular: Regular rhythm.  Tachycardia present.   Pulmonary/Chest: Effort normal and breath sounds normal. No respiratory distress. He has no wheezes.  Musculoskeletal: Normal range of motion.  Neurological: He is alert and oriented to person, place, and time.  Skin: Skin is warm and dry.  Psychiatric: He has a normal mood and affect. His behavior is normal.  Nursing note and vitals reviewed.   BP 106/71   Pulse 89   Temp 98.4 F (36.9 C) (Oral)   Resp 17   Ht 5' 10.5" (1.791 m)   Wt 141 lb (64 kg)   SpO2 98%   BMI 19.95 kg/m      Assessment & Plan:   1. Abdominal pain, left lower quadrant - saw GI who attempted colonoscopy twice but couldn't get  good cleanout - recommended cologuard for future colon cancer screening  2. Isolated proteinuria without specific morphologic lesion - sig improved on last microalb - pt would like to recheck wkly with lab-only visit - standing orders entered.  Could not tolerate even very low dose acei/arb due to hypotension.  3. Chronic fatigue - pt requests to be checked for mono, DM, hypoglycemia, CKD  4. Vitamin D deficiency - recheck  5. Acute recurrent frontal sinusitis - freq nasal saline, hot showers/tea/humidifier, amoxicillin - poss cause of new right temporal/orbital HAs (pt's usu are Left sided).  6.      Intractable chronic migraine without aura and without status migrainosus - wonders about stopping by office for nurse only visit for oxygen therapy if acute migraine - concerned insurance wouldn't pay for home O2.  Encouraged pt to consider TCMS/biofeedback. 7.      Pure hypercholesterolemia - today's lipids sig increased and ASCVD still 5.4% with optimal ASCVD risk of 5% and lifetime risk of 46% so not rec rx med treatment, esp considering numerous med sensitivities/intolerances 8.      Left testicular pain- seen by [facility] urology and now second opinion with [facility] -  suspect 2/2 below 9.      Pelvic floor dysfunction - Pt has still not pursued pelvic floor PT - he is worried about cost as was $50/session at PT but might have not been in network? Call insurance.   Orders Placed This Encounter  Procedures  . Microalbumin/Creatinine Ratio, Urine  . Comprehensive metabolic panel    Order Specific Question:   Has the patient fasted?    Answer:   Yes  . TSH  . Lipid panel    Order Specific Question:   Has the patient fasted?    Answer:   Yes  . Epstein-Barr virus VCA antibody panel  . Hemoglobin A1c  . VITAMIN D 25 Hydroxy (Vit-D Deficiency, Fractures)  . TestT+TestF+SHBG  . Cortisol  . Microalbumin/Creatinine Ratio, Urine    Standing Status:   Standing    Number of Occurrences:   12    Standing Expiration Date:   06/28/2017  . POCT urinalysis dipstick  . POCT Microscopic Urinalysis ([facility])  . POCT CBC  . POCT SEDIMENTATION RATE  . POCT urinalysis dipstick    Standing Status:   Standing    Number of Occurrences:   12    Standing Expiration Date:   06/28/2017    Meds ordered this encounter  Medications  . methylPREDNISolone acetate (DEPO-MEDROL) injection 80 mg  . amoxicillin (AMOXIL) 500 MG capsule    Sig: Take 2 capsules (1,000 mg total) by mouth 2 (two) times daily.    Dispense:  28 capsule    Refill:  0    I personally performed the services described in this documentation, which was scribed in my presence. The recorded information has been reviewed and considered, and addended by me as needed.   [name], M.D.  Primary Care at [facility] [address] [place], [place] [address] [phone] phone [phone] fax  12/30/16 3:24 AM

## 2016-12-29 NOTE — Patient Instructions (Addendum)
IF you received an x-ray today, you will receive an invoice from [facility]. Please contact [facility] at [phone] with questions or concerns regarding your invoice.   IF you received labwork today, you will receive an invoice from [facility]. Please contact [name] at [phone] with questions or concerns regarding your invoice.   Our billing staff will not be able to assist you with questions regarding bills from these companies.  You will be contacted with the lab results as soon as they are available. The fastest way to get your results is to activate your [name] account. Instructions are located on the last page of this paperwork. If you have not heard from [place] regarding the results in 2 weeks, please contact this office.   [place] Address: [address], [place], [place] [address] Phone: [phone]  [facility] Address: [address] [place], [place] [address] Phone: [phone]  [place].  Address: [address] [place], [place] [address] Phone: [phone]    Sinus Headache A sinus headache occurs when the paranasal sinuses become clogged or swollen. Paranasal sinuses are air pockets within the bones of the face. Sinus headaches can range from mild to severe. What are the causes? A sinus headache can result from various conditions that affect the sinuses, such as:  Colds.  Sinus infections.  Allergies.  What are the signs or symptoms? The main symptom of this condition is a headache that may feel like pain or pressure in the face, forehead, ears, or upper teeth. People who have a sinus headache often have other symptoms, such as:  Congested or runny nose.  Fever.  Inability to smell.  Weather changes can make symptoms worse. How is this diagnosed? This condition may be diagnosed based on:  A physical exam and medical history.  Imaging tests, such as a CT  scan and MRI, to check for problems with the sinuses.  A specialist may look into the sinuses with a tool that has a camera (endoscopy).  How is this treated? Treatment for this condition depends on the cause.  Sinus pain that is caused by a sinus infection may be treated with antibiotic medicine.  Sinus pain that is caused by allergies may be helped by allergy medicines (antihistamines) and medicated nasal sprays.  Sinus pain that is caused by congestion may be helped by flushing the nose and sinuses with saline solution.  Follow these instructions at home:  Take medicines only as directed by your health care provider.  If you were prescribed an antibiotic medicine, finish all of it even if you start to feel better.  If you have congestion, use a nasal spray to help reduce pressure.  If directed, apply a warm, moist washcloth to your face to help relieve pain. Contact a health care provider if:  You have headaches more than one time each week.  You have sensitivity to light or sound.  You have a fever.  You feel sick to your stomach (nauseous) or you throw up (vomit).  Your headaches do not get better with treatment. Many people think that they have a sinus headache when they actually have migraines or tension headaches. Get help right away if:  You have vision problems.  You have sudden, severe pain in your face or head.  You have a seizure.  You are confused.  You have a stiff neck. This information is not intended to replace advice given  to you by your health care provider. Make sure you discuss any questions you have with your health care provider. Document Released: 05/24/2004 Document Revised: 12/11/2015 Document Reviewed: 04/12/2014 Elsevier Interactive Patient Education  2018 [place].   Repetitive Transcranial Magnetic Stimulation Repetitive transcranial magnetic stimulation (rTMS) is a type of brain stimulation therapy that is used to treat major  depression. You may have this procedure if your depression has not responded to medicine and other treatments. In rTMS, an insulated magnetic coil is held directly against your forehead. It delivers short electromagnetic pulses to specific areas of the brain responsible for mood regulation. These electric currents stimulate nerve cells in the targeted area. You may have multiple treatments of rTMS. Tell a health care provider about:  Any allergies you have.  All medicines you are taking, including vitamins, herbs, eye drops, creams, and over-the-counter medicines.  Any blood disorders you have.  Any surgeries you have had.  Any medical conditions you have.  Whether you are pregnant or may be pregnant.  Any metal you may have in your body, such as a cochlear implant, spinal cord stimulator, pacemaker, stents, plates or screws. What are the risks? Common side effects of treatment include:  A headache.  A feeling of lightheadedness.  Discomfort, twitching, or tingling in your scalp.  Discomfort in your jaw or cheek on the side of your face where you had the procedure.  Rare side effects include:  Seizures. These are more common in people who have epilepsy, a history of seizures, or are already taking certain medicines that can trigger seizures.  Experiencing periods of elation or irritability and high energy (mania). This is more common in people who have bipolar disorder.  Hearing loss. This can happen if you do not have good ear protection during treatment.  What happens before the procedure?  Ask your health care provider about changing or stopping your normal medicines. What happens during the procedure?  You will be seated in a reclining chair.  You will be given earplugs to wear.  Your health care provider will use a machine to place an electromagnetic coil against your forehead.  Your health care provider will use a computer program to send electromagnetic pulses  through the coil to specific areas of your brain.  You may hear clicking sounds and feel tapping on your forehead.  Your health care provider may adjust or move the coil during your treatment session. The procedure may vary among health care providers and hospitals. What happens after the procedure?  Keep all follow-up visits as told by your health care provider. This is important. This information is not intended to replace advice given to you by your health care provider. Make sure you discuss any questions you have with your health care provider. Document Released: 09/06/2010 Document Revised: 09/22/2015 Document Reviewed: 11/03/2014 Elsevier Interactive Patient Education  2018 [place].  Chronic Fatigue Syndrome Chronic fatigue syndrome (CFS) is a condition that causes extreme tiredness (fatigue). This fatigue does not improve with rest, and it gets worse with physical or mental activity. You may have several other symptoms along with fatigue. Symptoms may come and go, but they generally last for months. Sometimes, CFS gets better over time, but it can be a lifelong condition. There is no cure, but there are many possible treatments. You will need to work with your health care providers to find a treatment plan that works best for you. What are the causes? The cause of CFS is not known.  There may be more than one cause. Possible causes include:  An infection.  An abnormal body defense system (immune system).  Low blood pressure.  Poor diet.  Physical or emotional stress.  What increases the risk? You are more likely to develop this condition if:  You are male.  You are 49?57 years old.  You have a family history of CFS.  You live with a lot of emotional stress.  What are the signs or symptoms? The main symptom of CFS is fatigue that is severe enough to interfere with day-to-day activities. This fatigue does not get better with rest, and it gets worse with physical  or mental activity. There are eight other major symptoms of CFS:  Lack of energy (malaise) that lasts more than 24 hours after physical exertion.  Sleep that does not relieve fatigue (unrefreshing sleep).  Short-term memory loss or confusion.  Joint pain without redness or swelling.  Muscle aches.  Headaches.  Painful and swollen glands (lymph nodes) in the neck or under the arms.  Sore throat.  You may also have:  Abdominal cramps, constipation, or diarrhea (irritable bowel).  Chills.  Night sweats.  Vision changes.  Dizziness.  Mental confusion (brain fog).  Clumsiness.  Sensitivity to food, noise, or odors.  Mood swings, depression, or anxiety attacks.  How is this diagnosed? There are no tests that can diagnose this condition. Your health care provider will make the diagnosis based on your medical history, a physical exam, and a mental health exam. However, it is important to make sure that your symptoms are not caused by another medical condition. You may have lab tests or X-rays to rule out other conditions. For your health care provider to diagnose CFS:  You must have had fatigue for at least 6 straight months.  Fatigue must be your first symptom, and it must be severe enough to interfere with day-to-day activities.  There must be no other cause found for the fatigue.  You must also have at least four of the eight other major symptoms of CFS.  How is this treated? There is no cure for CFS. The condition affects everyone differently. You will need to work with your team of health care providers to find the best treatments for your symptoms. Your team may include your primary care provider, physical and exercise therapists, and mental health therapists. Treatment may include:  Improving sleep with a regular bedtime routine.  Avoiding caffeine, alcohol, and tobacco.  Doing light exercise and stretching during the day.  Taking medicines to help you sleep  or to relieve joint or muscle pain.  Learning and practicing relaxation techniques.  Using memory aids or doing brainteasers to improve memory and concentration.  Seeing a mental health therapist to evaluate and treat depression, if necessary.  Trying massage therapy, acupuncture, and movement exercises, such as yoga or tai chi.  Follow these instructions at home:  Activity  Exercise regularly, as told by your health care provider.  Avoid fatigue by pacing yourself during the day and getting enough sleep at night.  Go to bed and get up at the same time every day. Eating and drinking  Avoid caffeine and alcohol.  Avoid heavy meals in the evening.  Eat a well-balanced diet. General instructions  Take over-the-counter and prescription medicines only as told by your health care provider.  Do not use herbal or dietary supplements unless they are approved by your health care provider.  Maintain a healthy weight.  Avoid stress and  use stress-reducing techniques that you learn in therapy.  Do not use any products that contain nicotine or tobacco, such as cigarettes and e-cigarettes. If you need help quitting, ask your health care provider.  Consider joining a CFS support group.  Keep all follow-up visits as told by your health care provider. This is important. Contact a health care provider if:  Your symptoms do not get better or they get worse.  You feel angry, guilty, anxious, or depressed. This information is not intended to replace advice given to you by your health care provider. Make sure you discuss any questions you have with your health care provider. Document Released: 05/24/2004 Document Revised: 12/22/2015 Document Reviewed: 07/25/2015 Elsevier Interactive Patient Education  [employer].

## 2016-12-30 LAB — MICROALBUMIN / CREATININE URINE RATIO
CREATININE, UR: 217.5 mg/dL
MICROALBUM., U, RANDOM: 276 ug/mL
Microalb/Creat Ratio: 126.9 mg/g creat — ABNORMAL HIGH (ref 0.0–30.0)

## 2017-01-01 ENCOUNTER — Other Ambulatory Visit: Payer: Self-pay | Admitting: Family Medicine

## 2017-01-01 DIAGNOSIS — R7989 Other specified abnormal findings of blood chemistry: Secondary | ICD-10-CM

## 2017-01-01 DIAGNOSIS — R5382 Chronic fatigue, unspecified: Secondary | ICD-10-CM

## 2017-01-03 LAB — COMPREHENSIVE METABOLIC PANEL
A/G RATIO: 1.6 (ref 1.2–2.2)
ALBUMIN: 4.3 g/dL (ref 3.5–5.5)
ALK PHOS: 70 IU/L (ref 39–117)
ALT: 10 IU/L (ref 0–44)
AST: 17 IU/L (ref 0–40)
BILIRUBIN TOTAL: 1.1 mg/dL (ref 0.0–1.2)
BUN / CREAT RATIO: 8 — AB (ref 9–20)
BUN: 9 mg/dL (ref 6–24)
CHLORIDE: 100 mmol/L (ref 96–106)
CO2: 23 mmol/L (ref 20–29)
Calcium: 9.8 mg/dL (ref 8.7–10.2)
Creatinine, Ser: 1.06 mg/dL (ref 0.76–1.27)
GFR calc Af Amer: 90 mL/min/{1.73_m2} (ref >59)
GFR calc non Af Amer: 78 mL/min/{1.73_m2} (ref >59)
GLOBULIN, TOTAL: 2.7 g/dL (ref 1.5–4.5)
GLUCOSE: 79 mg/dL (ref 65–99)
Potassium: 4.4 mmol/L (ref 3.5–5.2)
Sodium: 138 mmol/L (ref 134–144)
Total Protein: 7 g/dL (ref 6.0–8.5)

## 2017-01-03 LAB — EPSTEIN-BARR VIRUS VCA ANTIBODY PANEL
EBV Early Antigen Ab, IgG: 11.6 U/mL — ABNORMAL HIGH (ref 0.0–8.9)
EBV NA IgG: 425 U/mL — ABNORMAL HIGH (ref 0.0–17.9)
EBV VCA IgG: 501 U/mL — ABNORMAL HIGH (ref 0.0–17.9)
EBV VCA IgM: <36 U/mL (ref 0.0–35.9)

## 2017-01-03 LAB — CORTISOL: CORTISOL: 8.3 ug/dL

## 2017-01-03 LAB — LIPID PANEL
Chol/HDL Ratio: 4.5 ratio (ref 0.0–5.0)
Cholesterol, Total: 233 mg/dL — ABNORMAL HIGH (ref 100–199)
HDL: 52 mg/dL
LDL Calculated: 150 mg/dL — ABNORMAL HIGH (ref 0–99)
Triglycerides: 157 mg/dL — ABNORMAL HIGH (ref 0–149)
VLDL Cholesterol Cal: 31 mg/dL (ref 5–40)

## 2017-01-03 LAB — TESTT+TESTF+SHBG
SEX HORMONE BINDING: 61.1 nmol/L (ref 19.3–76.4)
TESTOSTERONE FREE: 5.4 pg/mL — AB (ref 7.2–24.0)
TESTOSTERONE, TOTAL: 521.4 ng/dL (ref 264.0–916.0)

## 2017-01-03 LAB — VITAMIN D 25 HYDROXY (VIT D DEFICIENCY, FRACTURES): VIT D 25 HYDROXY: 20.1 ng/mL — AB (ref 30.0–100.0)

## 2017-01-03 LAB — HEMOGLOBIN A1C
Est. average glucose Bld gHb Est-mCnc: 108 mg/dL
Hgb A1c MFr Bld: 5.4 % (ref 4.8–5.6)

## 2017-01-03 LAB — TSH: TSH: 0.634 u[IU]/mL (ref 0.450–4.500)

## 2017-01-07 ENCOUNTER — Telehealth: Payer: Self-pay | Admitting: Family Medicine

## 2017-01-10 ENCOUNTER — Ambulatory Visit (INDEPENDENT_AMBULATORY_CARE_PROVIDER_SITE_OTHER): Payer: PPO | Admitting: Family Medicine

## 2017-01-10 ENCOUNTER — Encounter: Payer: Self-pay | Admitting: Family Medicine

## 2017-01-10 VITALS — BP 115/68 | HR 88 | Temp 98.6°F | Resp 16 | Ht 69.75 in | Wt 142.8 lb

## 2017-01-10 DIAGNOSIS — S46011A Strain of muscle(s) and tendon(s) of the rotator cuff of right shoulder, initial encounter: Secondary | ICD-10-CM

## 2017-01-10 DIAGNOSIS — J0111 Acute recurrent frontal sinusitis: Secondary | ICD-10-CM | POA: Diagnosis not present

## 2017-01-10 MED ORDER — METHYLPREDNISOLONE ACETATE 80 MG/ML IJ SUSP
120.0000 mg | Freq: Once | INTRAMUSCULAR | Status: AC
  Administered 2017-01-10: 120 mg via INTRAMUSCULAR

## 2017-01-10 MED ORDER — LEVOFLOXACIN 500 MG PO TABS: 500.0000 mg | ORAL_TABLET | Freq: Every day | ORAL | 0 refills | Status: DC

## 2017-01-10 NOTE — Progress Notes (Signed)
FP

## 2017-01-10 NOTE — Telephone Encounter (Signed)
Please advise 

## 2017-01-10 NOTE — Progress Notes (Signed)
Subjective:    Patient ID: William [name], male    DOB: [birth year], 56 y.o.   MRN: [id number] Chief Complaint  Patient presents with  . Follow-up    SINUS and medication    HPI  I saw William [name] 56 yo 2 wks prior and he was having worsening left sided headaches with pressure and throbbing starting at his right temple and radiating posteriorly so treated for a sinus infxn.  3-4d and started having severe diarrhea which resolved after 2d off of amox. He retried it 4d ago and almost immed. He tolerates azithro fine as well as levaquin. HA is still severe.  He is still readlly fatigue.  He went to bed at 7 pm last night and woke up at midnight and couldn't fall back to sleep. He has been putting ice ot high right temple.  He has been alternatin between tramadol and butalbital but the latter caused lightheadedness.  He was considering trying the flexeril and right after his last visit he wrenched his right shoulder. Was going to see Mr. William [name] to see his visit.   Didn't feel any different after the depo-medrol shot. He did feel better after the amoxicillin.  Lightheadhess, dizziness, imbalancde.   Past Medical History:  Diagnosis Date  . Allergy   . Anxiety   . Depression   . Gastroparesis   . Heart murmur   . Migraine headache   . Mitral valve prolapse    No past surgical history on file. Current Outpatient Prescriptions on File Prior to Visit  Medication Sig Dispense Refill  . butalbital-acetaminophen-caffeine (FIORICET, ESGIC) 50-325-40 MG tablet Take 1-2 tablets by mouth every 4 (four) hours as needed for headache. 40 tablet 0  . traMADol (ULTRAM) 50 MG tablet Take 1 tablet (50 mg total) by mouth every 8 (eight) hours as needed. 25 tablet 2  . Vitamin D, Ergocalciferol, (DRISDOL) 50000 units CAPS capsule Take 1 capsule (50,000 Units total) by mouth every 7 (seven) days. 24 capsule 0   No current facility-administered medications on file prior to visit.    Allergies    Allergen Reactions  . Erythromycin Nausea And Vomiting and Anaphylaxis  . Hydrocodone-Acetaminophen Nausea And Vomiting  . Tramadol Nausea And Vomiting  . Cephalexin Diarrhea and Nausea And Vomiting  . Cephalosporins Diarrhea and Nausea And Vomiting  . Compazine [Prochlorperazine Edisylate]     uncontrollable shake - tremor  . Ketoconazole Itching  . Ketorolac Nausea And Vomiting  . Ketorolac Tromethamine     REACTION: Reaction not known  . Latex Itching    REACTION: Reaction not known  . Nalbuphine Other (See Comments)    States loss of consciousness REACTION: Reaction not known  . Oxycodone-Acetaminophen Other (See Comments) and Swelling  . Prednisone Nausea And Vomiting  . Prochlorperazine Edisylate     REACTION: shakes/tremors  . Topamax [Topiramate] Diarrhea and Nausea And Vomiting  . Cefdinir Diarrhea, Nausea And Vomiting and Rash  . Neosporin [Neomycin-Bacitracin Zn-Polymyx] Rash  . Oxycodone-Acetaminophen Other (See Comments)   Family History  Problem Relation Age of Onset  . Hypertension Mother   . Hypertension Brother    Social History   Social History  . Marital status: Married    Spouse name: N/A  . Number of children: N/A  . Years of education: N/A   Social History Main Topics  . Smoking status: Never Smoker  . Smokeless tobacco: Never Used  . Alcohol use No  . Drug use: No  . Sexual activity: Yes  Partners: Female   Other Topics Concern  . None   Social History Narrative  . None   Depression screen [facility] 2/9 01/10/2017 12/29/2016 10/17/2016 09/29/2016 09/28/2016  Decreased Interest 0 0 0 0 0  Down, Depressed, Hopeless 0 0 0 0 0  PHQ - 2 Score 0 0 0 0 0  Altered sleeping - - - 0 -  Tired, decreased energy - - - 0 -  Change in appetite - - - 0 -  Feeling bad or failure about yourself  - - - 0 -  Trouble concentrating - - - 0 -  Moving slowly or fidgety/restless - - - 0 -  Suicidal thoughts - - - 0 -  PHQ-9 Score - - - 0 -  Some recent data might  be hidden    Review of Systems See hpi    Objective:   Physical Exam  Constitutional: He is oriented to person, place, and time. He appears well-developed and well-nourished. No distress.  HENT:  Head: Normocephalic and atraumatic.  Right Ear: External ear and ear canal normal. Tympanic membrane is retracted. A middle ear effusion is present.  Left Ear: External ear and ear canal normal. Tympanic membrane is retracted. A middle ear effusion is present.  Nose: Mucosal edema and rhinorrhea present. Right sinus exhibits maxillary sinus tenderness. Left sinus exhibits maxillary sinus tenderness.  Mouth/Throat: Uvula is midline and mucous membranes are normal. Posterior oropharyngeal erythema present. No oropharyngeal exudate or posterior oropharyngeal edema.  Eyes: Conjunctivae are normal. Right eye exhibits no discharge. Left eye exhibits no discharge. No scleral icterus.  Neck: Normal range of motion. Neck supple. No thyromegaly present.  Cardiovascular: Normal rate, regular rhythm, normal heart sounds and intact distal pulses.   Pulmonary/Chest: Effort normal and breath sounds normal. No respiratory distress.  Musculoskeletal:       Right shoulder: He exhibits tenderness (ttp over spasm over proximal supraspinatus), spasm and decreased strength. He exhibits normal range of motion, no bony tenderness and no deformity.       Left shoulder: Normal.  Lymphadenopathy:       Head (right side): Submandibular adenopathy present.       Head (left side): Submandibular adenopathy present.    He has no cervical adenopathy.       Right: No supraclavicular adenopathy present.       Left: No supraclavicular adenopathy present.  Neurological: He is alert and oriented to person, place, and time.  Skin: Skin is warm and dry. He is not diaphoretic. No erythema.  Psychiatric: He has a normal mood and affect. His behavior is normal.      Assessment & Plan:   1. Acute recurrent frontal sinusitis - could  not tolerate amox 1g bid - only took about 4d of the 10d course, has done well on levaquin before and sxs have sig worsened so will try levaquin. Did not notice any sxs improvement with the DepoMedrol 80mg  IM given 2 wks ago though states he had noticed improvement when given with sinus infxns prior so will try higher dose today - avoid oral course due to reports of n/v and recent severe diarrhea with amox.  2. Rotator cuff strain, right, initial encounter - heat and prn flexeril. Very gentle stretching over the next 1-2 wks,  Then ok to start PT is sxs persist. Avoid any lifting, fast/jerky movements, or extremes of ROM.     Meds ordered this encounter  Medications  . methylPREDNISolone acetate (DEPO-MEDROL) injection 120 mg  .  levofloxacin (LEVAQUIN) 500 MG tablet    Sig: Take 1 tablet (500 mg total) by mouth daily.    Dispense:  7 tablet    Refill:  0    [name], M.D.  Primary Care at [facility] [address] [place], [place] [address] [phone] phone [phone] fax  01/11/17 2:34 PM

## 2017-01-10 NOTE — Patient Instructions (Addendum)
IF you received an x-ray today, you will receive an invoice from [facility]. Please contact [facility] at [phone] with questions or concerns regarding your invoice.   IF you received labwork today, you will receive an invoice from [place]. Please contact [name] at [phone] with questions or concerns regarding your invoice.   Our billing staff will not be able to assist you with questions regarding bills from these companies.  You will be contacted with the lab results as soon as they are available. The fastest way to get your results is to activate your [name] account. Instructions are located on the last page of this paperwork. If you have not heard from [place] regarding the results in 2 weeks, please contact this office.     Sinus Headache A sinus headache occurs when the paranasal sinuses become clogged or swollen. Paranasal sinuses are air pockets within the bones of the face. Sinus headaches can range from mild to severe. What are the causes? A sinus headache can result from various conditions that affect the sinuses, such as:  Colds.  Sinus infections.  Allergies.  What are the signs or symptoms? The main symptom of this condition is a headache that may feel like pain or pressure in the face, forehead, ears, or upper teeth. People who have a sinus headache often have other symptoms, such as:  Congested or runny nose.  Fever.  Inability to smell.  Weather changes can make symptoms worse. How is this diagnosed? This condition may be diagnosed based on:  A physical exam and medical history.  Imaging tests, such as a CT scan and MRI, to check for problems with the sinuses.  A specialist may look into the sinuses with a tool that has a camera (endoscopy).  How is this treated? Treatment for this condition depends on the cause.  Sinus pain that is caused by a sinus infection may be treated with antibiotic medicine.  Sinus pain that is  caused by allergies may be helped by allergy medicines (antihistamines) and medicated nasal sprays.  Sinus pain that is caused by congestion may be helped by flushing the nose and sinuses with saline solution.  Follow these instructions at home:  Take medicines only as directed by your health care provider.  If you were prescribed an antibiotic medicine, finish all of it even if you start to feel better.  If you have congestion, use a nasal spray to help reduce pressure.  If directed, apply a warm, moist washcloth to your face to help relieve pain. Contact a health care provider if:  You have headaches more than one time each week.  You have sensitivity to light or sound.  You have a fever.  You feel sick to your stomach (nauseous) or you throw up (vomit).  Your headaches do not get better with treatment. Many people think that they have a sinus headache when they actually have migraines or tension headaches. Get help right away if:  You have vision problems.  You have sudden, severe pain in your face or head.  You have a seizure.  You are confused.  You have a stiff neck. This information is not intended to replace advice given to you by your health care provider. Make sure you discuss any questions you have with your health care provider. Document Released: 05/24/2004 Document Revised: 12/11/2015 Document Reviewed: 04/12/2014 Elsevier Interactive Patient Education  2018 [place] Cuff Tendinitis Rotator cuff tendinitis is inflammation of the tough,  cord-like bands that connect muscle to bone (tendons) in the rotator cuff. The rotator cuff includes all of the muscles and tendons that connect the arm to the shoulder. The rotator cuff holds the head of the upper arm bone (humerus) in the cup (fossa) of the shoulder blade (scapula). This condition can lead to a long-lasting (chronic) tear. The tear may be partial or complete. What are the causes? This condition  is usually caused by overusing the rotator cuff. What increases the risk? This condition is more likely to develop in athletes and workers who frequently use their shoulder or reach over their heads. This can include activities such as:  Tennis.  Baseball or softball.  Swimming.  Construction work.  Painting.  What are the signs or symptoms? Symptoms of this condition include:  Pain spreading (radiating) from the shoulder to the upper arm.  Swelling and tenderness in front of the shoulder.  Pain when reaching, pulling, or lifting the arm above the head.  Pain when lowering the arm from above the head.  Minor pain in the shoulder when resting.  Increased pain in the shoulder at night.  Difficulty placing the arm behind the back.  How is this diagnosed? This condition is diagnosed with a medical history and physical exam. Tests may also be done, including:  X-rays.  MRI.  Ultrasounds.  CT or MR arthrogram. During this test, a contrast material is injected and then images are taken.  How is this treated? Treatment for this condition depends on the severity of the condition. In less severe cases, treatment may include:  Rest. This may be done with a sling that holds the shoulder still (immobilization). Your health care provider may also recommend avoiding activities that involve lifting your arm over your head.  Icing the shoulder.  Anti-inflammatory medicines, such as aspirin or ibuprofen.  In more severe cases, treatment may include:  Physical therapy.  Steroid injections.  Surgery.  Follow these instructions at home: If you have a sling:  Wear the sling as told by your health care provider. Remove it only as told by your health care provider.  Loosen the sling if your fingers tingle, become numb, or turn cold and blue.  Keep the sling clean.  If the sling is not waterproof, do not let it get wet. Remove it, if allowed, or cover it with a watertight  covering when you take a bath or shower. Managing pain, stiffness, and swelling  If directed, put ice on the injured area. ? If you have a removable sling, remove it as told by your health care provider. ? Put ice in a plastic bag. ? Place a towel between your skin and the bag. ? Leave the ice on for 20 minutes, 2-3 times a day.  Move your fingers often to avoid stiffness and to lessen swelling.  Raise (elevate) the injured area above the level of your heart while you are lying down.  Find a comfortable sleeping position or sleep on a recliner, if available. Driving  Do not drive or use heavy machinery while taking prescription pain medicine.  Ask your health care provider when it is safe to drive if you have a sling on your arm. Activity  Rest your shoulder as told by your health care provider.  Return to your normal activities as told by your health care provider. Ask your health care provider what activities are safe for you.  Do any exercises or stretches as told by your health care provider.  If you do repetitive overhead tasks, take small breaks in between and include stretching exercises as told by your health care provider. General instructions  Do not use any products that contain nicotine or tobacco, such as cigarettes and e-cigarettes. These can delay healing. If you need help quitting, ask your health care provider.  Take over-the-counter and prescription medicines only as told by your health care provider.  Keep all follow-up visits as told by your health care provider. This is important. Contact a health care provider if:  Your pain gets worse.  You have new pain in your arm, hands, or fingers.  Your pain is not relieved with medicine or does not get better after 6 weeks of treatment.  You have cracking sensations when moving your shoulder in certain directions.  You hear a snapping sound after using your shoulder, followed by severe pain and weakness. Get  help right away if:  Your arm, hand, or fingers are numb or tingling.  Your arm, hand, or fingers are swollen or painful or they turn white or blue. Summary  Rotator cuff tendinitis is inflammation of the tough, cord-like bands that connect muscle to bone (tendons) in the rotator cuff.  This condition is usually caused by overusing the rotator cuff, which includes all of the muscles and tendons that connect the arm to the shoulder.  This condition is more likely to develop in athletes and workers who frequently use their shoulder or reach over their heads.  Treatment generally includes rest, anti-inflammatory medicines, and icing. In some cases, physical therapy and steroid injections may be needed. In severe cases, surgery may be needed. This information is not intended to replace advice given to you by your health care provider. Make sure you discuss any questions you have with your health care provider. Document Released: 07/07/2003 Document Revised: 04/02/2016 Document Reviewed: 04/02/2016 Elsevier Interactive Patient Education  2017 [employer].

## 2017-01-24 ENCOUNTER — Encounter: Payer: Self-pay | Admitting: Family Medicine

## 2017-01-24 ENCOUNTER — Ambulatory Visit (INDEPENDENT_AMBULATORY_CARE_PROVIDER_SITE_OTHER): Payer: PPO | Admitting: Family Medicine

## 2017-01-24 VITALS — BP 110/82 | HR 121 | Temp 98.6°F | Resp 16 | Ht 69.75 in | Wt 140.0 lb

## 2017-01-24 DIAGNOSIS — J3489 Other specified disorders of nose and nasal sinuses: Secondary | ICD-10-CM

## 2017-01-24 DIAGNOSIS — R112 Nausea with vomiting, unspecified: Secondary | ICD-10-CM | POA: Diagnosis not present

## 2017-01-24 DIAGNOSIS — M25511 Pain in right shoulder: Secondary | ICD-10-CM | POA: Diagnosis not present

## 2017-01-24 MED ORDER — METHYLPREDNISOLONE SODIUM SUCC 125 MG IJ SOLR
125.0000 mg | Freq: Once | INTRAMUSCULAR | Status: AC
  Administered 2017-01-24: 125 mg via INTRAMUSCULAR

## 2017-01-24 MED ORDER — ONDANSETRON HCL 4 MG/2ML IJ SOLN
2.0000 mg | Freq: Once | INTRAMUSCULAR | Status: AC
  Administered 2017-01-24: 2 mg via INTRAMUSCULAR

## 2017-01-24 MED ORDER — ONDANSETRON HCL 8 MG PO TABS: 8.0000 mg | ORAL_TABLET | Freq: Three times a day (TID) | ORAL | 0 refills | Status: DC | PRN

## 2017-01-24 NOTE — Progress Notes (Signed)
Chief Complaint  Patient presents with  . Headache    hx of migraines, right temple pain, declines the flu today will come back  . Sinusitis  . right shoulder pain    slipped on plastic   . Emesis    5 times since 12 noon today    HPI  Sinusitis Pt reports that he took 4 days worth of amoxicillin for sinusitis prescribed on 01/10/17 He states that he has been having sinus headache  He states that the headache is a sharp piercing pain in the right maxillary area that radiates to the right temple  He states that he took butalbital which did not help He has some old tramadols but he has not taken them He would rate his pain 7/10  Diarrhea He states that he also fell at home 2 days ago He states that since starting antibiotic he has been having diarrhea and emesis He states that he has been vomiting 5 times alone since 4 hours He states that his emesis was mostly water He took an old prescription on promethazine for vomiting  Shoulder pain- Right  He reports that he has a history of the right shoulder injury  Now he fell on his right shoulder again He reports that on Tuesday, 2 days ago, he slipped on the garbage lid and fell onto his shoulder The pain is 4/10 The pain is sharp and is posterior shoulder Pain is aggravated by overhead movements of the arm  Past Medical History:  Diagnosis Date  . Allergy   . Anxiety   . Depression   . Gastroparesis   . Heart murmur   . Migraine headache   . Mitral valve prolapse     Current Outpatient Prescriptions  Medication Sig Dispense Refill  . butalbital-acetaminophen-caffeine (FIORICET, ESGIC) 50-325-40 MG tablet Take 1-2 tablets by mouth every 4 (four) hours as needed for headache. 40 tablet 0  . levofloxacin (LEVAQUIN) 500 MG tablet Take 1 tablet (500 mg total) by mouth daily. 7 tablet 0  . traMADol (ULTRAM) 50 MG tablet Take 1 tablet (50 mg total) by mouth every 8 (eight) hours as needed. 25 tablet 2  . Vitamin D,  Ergocalciferol, (DRISDOL) 50000 units CAPS capsule Take 1 capsule (50,000 Units total) by mouth every 7 (seven) days. 24 capsule 0  . ondansetron (ZOFRAN) 8 MG tablet Take 1 tablet (8 mg total) by mouth every 8 (eight) hours as needed for nausea or vomiting. 20 tablet 0   No current facility-administered medications for this visit.     Allergies:  Allergies  Allergen Reactions  . Erythromycin Nausea And Vomiting and Anaphylaxis  . Hydrocodone-Acetaminophen Nausea And Vomiting  . Tramadol Nausea And Vomiting  . Cephalexin Diarrhea and Nausea And Vomiting  . Cephalosporins Diarrhea and Nausea And Vomiting  . Compazine [Prochlorperazine Edisylate]     uncontrollable shake - tremor  . Ketoconazole Itching  . Ketorolac Nausea And Vomiting  . Ketorolac Tromethamine     REACTION: Reaction not known  . Latex Itching    REACTION: Reaction not known  . Nalbuphine Other (See Comments)    States loss of consciousness REACTION: Reaction not known  . Oxycodone-Acetaminophen Other (See Comments) and Swelling  . Prednisone Nausea And Vomiting  . Prochlorperazine Edisylate     REACTION: shakes/tremors  . Topamax [Topiramate] Diarrhea and Nausea And Vomiting  . Cefdinir Diarrhea, Nausea And Vomiting and Rash  . Neosporin [Neomycin-Bacitracin Zn-Polymyx] Rash  . Oxycodone-Acetaminophen Other (See Comments)  No past surgical history on file.  Social History   Social History  . Marital status: Married    Spouse name: N/A  . Number of children: N/A  . Years of education: N/A   Social History Main Topics  . Smoking status: Never Smoker  . Smokeless tobacco: Never Used  . Alcohol use No  . Drug use: No  . Sexual activity: Yes    Partners: Female   Other Topics Concern  . None   Social History Narrative  . None    Review of Systems  Constitutional: Negative for chills, fever and weight loss.  HENT: Positive for congestion and sinus pain. Negative for ear discharge and ear pain.    Respiratory: Negative for cough, shortness of breath and wheezing.   Cardiovascular: Negative for chest pain and palpitations.  Gastrointestinal:       See hpi  Skin: Negative for itching and rash.  Neurological: Negative for dizziness, tingling and headaches.  Endo/Heme/Allergies: Negative for environmental allergies. Does not bruise/bleed easily.  Psychiatric/Behavioral: Negative for depression. The patient is not nervous/anxious.     Objective: Vitals:   01/24/17 1521  BP: 110/82  Pulse: (!) 121  Resp: 16  Temp: 98.6 F (37 C)  TempSrc: Oral  SpO2: 98%  Weight: 140 lb (63.5 kg)  Height: 5' 9.75" (1.772 m)    Physical Exam  Constitutional: He is oriented to person, place, and time. He appears well-developed and well-nourished.  HENT:  Head: Normocephalic and atraumatic.  Right Ear: External ear normal.  Left Ear: External ear normal.  Nose: Nose normal.  Mouth/Throat: Oropharynx is clear and moist.  Eyes: Conjunctivae and EOM are normal.  Neck: Normal range of motion. No thyromegaly present.  Cardiovascular: Regular rhythm and normal heart sounds.  Tachycardia present.   No murmur heard. Pulmonary/Chest: Effort normal and breath sounds normal. No respiratory distress. He has no wheezes.  Musculoskeletal: He exhibits no edema.       Right shoulder: He exhibits decreased range of motion and spasm. He exhibits no tenderness, no bony tenderness, no swelling, no effusion, no crepitus, no deformity, no laceration, no pain, normal pulse and normal strength.  Neurological: He is alert and oriented to person, place, and time.  Skin: Skin is warm. Capillary refill takes less than 2 seconds.  Psychiatric: He has a normal mood and affect. His behavior is normal. Judgment and thought content normal.     Assessment and Plan William [name] was seen today for headache, sinusitis, right shoulder pain and emesis.  Diagnoses and all orders for this visit:  Non-intractable vomiting with  nausea, unspecified vomiting type- discussed zofran Advised completing a steroid course as his nausea is likely due to sinus infection and antibiotics -     ondansetron (ZOFRAN) injection 2 mg; Inject 1 mL (2 mg total) into the muscle once.  Sinus pain- would not do another round of antibiotic since pt also had diarrhea and vomiting -     methylPREDNISolone sodium succinate (SOLU-MEDROL) 125 mg/2 mL injection 125 mg; Inject 2 mLs (125 mg total) into the muscle once.  Acute pain of right shoulder- pt with numerous allergies, will give steroids for antiinflammatory -     methylPREDNISolone sodium succinate (SOLU-MEDROL) 125 mg/2 mL injection 125 mg; Inject 2 mLs (125 mg total) into the muscle once.  Other orders -     ondansetron (ZOFRAN) 8 MG tablet; Take 1 tablet (8 mg total) by mouth every 8 (eight) hours as needed for nausea or  vomiting.     [place]

## 2017-01-24 NOTE — Patient Instructions (Addendum)
Your should take your tramadol for pain every 8 hours for pain You can also take your butalbital-acetaminophen-caffeine one tablet every 4 hours for headaches.  So if you take your tramadol at 8am then you should take your butalbital at noon then alternate 4pm tramadol then 8pm fioricet.    IF you received an x-ray today, you will receive an invoice from [facility]. Please contact [facility] at [phone] with questions or concerns regarding your invoice.   IF you received labwork today, you will receive an invoice from [facility]. Please contact [name] at [phone] with questions or concerns regarding your invoice.   Our billing staff will not be able to assist you with questions regarding bills from these companies.  You will be contacted with the lab results as soon as they are available. The fastest way to get your results is to activate your [name] account. Instructions are located on the last page of this paperwork. If you have not heard from [place] regarding the results in 2 weeks, please contact this office.     Sinus Headache A sinus headache occurs when the paranasal sinuses become clogged or swollen. Paranasal sinuses are air pockets within the bones of the face. Sinus headaches can range from mild to severe. What are the causes? A sinus headache can result from various conditions that affect the sinuses, such as:  Colds.  Sinus infections.  Allergies.  What are the signs or symptoms? The main symptom of this condition is a headache that may feel like pain or pressure in the face, forehead, ears, or upper teeth. People who have a sinus headache often have other symptoms, such as:  Congested or runny nose.  Fever.  Inability to smell.  Weather changes can make symptoms worse. How is this diagnosed? This condition may be diagnosed based on:  A physical exam and medical history.  Imaging tests, such as a CT scan and MRI, to check for  problems with the sinuses.  A specialist may look into the sinuses with a tool that has a camera (endoscopy).  How is this treated? Treatment for this condition depends on the cause.  Sinus pain that is caused by a sinus infection may be treated with antibiotic medicine.  Sinus pain that is caused by allergies may be helped by allergy medicines (antihistamines) and medicated nasal sprays.  Sinus pain that is caused by congestion may be helped by flushing the nose and sinuses with saline solution.  Follow these instructions at home:  Take medicines only as directed by your health care provider.  If you were prescribed an antibiotic medicine, finish all of it even if you start to feel better.  If you have congestion, use a nasal spray to help reduce pressure.  If directed, apply a warm, moist washcloth to your face to help relieve pain. Contact a health care provider if:  You have headaches more than one time each week.  You have sensitivity to light or sound.  You have a fever.  You feel sick to your stomach (nauseous) or you throw up (vomit).  Your headaches do not get better with treatment. Many people think that they have a sinus headache when they actually have migraines or tension headaches. Get help right away if:  You have vision problems.  You have sudden, severe pain in your face or head.  You have a seizure.  You are confused.  You have a stiff neck. This information is not intended to replace advice given  to you by your health care provider. Make sure you discuss any questions you have with your health care provider. Document Released: 05/24/2004 Document Revised: 12/11/2015 Document Reviewed: 04/12/2014 Elsevier Interactive Patient Education  [employer].

## 2017-02-20 DIAGNOSIS — H04123 Dry eye syndrome of bilateral lacrimal glands: Secondary | ICD-10-CM | POA: Diagnosis not present

## 2017-02-21 ENCOUNTER — Ambulatory Visit (INDEPENDENT_AMBULATORY_CARE_PROVIDER_SITE_OTHER): Payer: PPO | Admitting: Family Medicine

## 2017-02-21 ENCOUNTER — Encounter: Payer: Self-pay | Admitting: Family Medicine

## 2017-02-21 VITALS — BP 110/68 | HR 96 | Temp 98.4°F | Resp 16 | Ht 69.75 in | Wt 142.0 lb

## 2017-02-21 DIAGNOSIS — J069 Acute upper respiratory infection, unspecified: Secondary | ICD-10-CM

## 2017-02-21 DIAGNOSIS — R197 Diarrhea, unspecified: Secondary | ICD-10-CM | POA: Diagnosis not present

## 2017-02-21 NOTE — Progress Notes (Signed)
Chief Complaint  Patient presents with  . URI    chills, night sweats, ha,  nausea, lightheadedness, cough, rn, sneezing and cough is heavy.   Onset: Monday, taking immodium for diarrhea and upset stomach.  No temps taken    HPI   Pt reports that he has been having chills, night sweats, headache, nausea, lightheadedness, cough, myalgia, sneezing.   Onset: Monday He has been taking imodium for diarrhea and upset stomach Did not check temp with a thermometer No recent travel No sick contacts Drinking plenty of fluids He recently started brown bread after not eating it for months     Past Medical History:  Diagnosis Date  . Allergy   . Anxiety   . Depression   . Gastroparesis   . Heart murmur   . Migraine headache   . Mitral valve prolapse     Current Outpatient Medications  Medication Sig Dispense Refill  . butalbital-acetaminophen-caffeine (FIORICET, ESGIC) 50-325-40 MG tablet Take 1-2 tablets by mouth every 4 (four) hours as needed for headache. 40 tablet 0  . ondansetron (ZOFRAN) 8 MG tablet Take 1 tablet (8 mg total) by mouth every 8 (eight) hours as needed for nausea or vomiting. 20 tablet 0  . traMADol (ULTRAM) 50 MG tablet Take 1 tablet (50 mg total) by mouth every 8 (eight) hours as needed. 25 tablet 2  . Vitamin D, Ergocalciferol, (DRISDOL) 50000 units CAPS capsule Take 1 capsule (50,000 Units total) by mouth every 7 (seven) days. 24 capsule 0  . Lifitegrast (XIIDRA) 5 % SOLN Apply 1 drop 2 (two) times daily to eye.     No current facility-administered medications for this visit.     Allergies:  Allergies  Allergen Reactions  . Erythromycin Nausea And Vomiting and Anaphylaxis  . Hydrocodone-Acetaminophen Nausea And Vomiting  . Tramadol Nausea And Vomiting  . Cephalexin Diarrhea and Nausea And Vomiting  . Cephalosporins Diarrhea and Nausea And Vomiting  . Compazine [Prochlorperazine Edisylate]     uncontrollable shake - tremor  . Ketoconazole Itching  .  Ketorolac Nausea And Vomiting  . Ketorolac Tromethamine     REACTION: Reaction not known  . Latex Itching    REACTION: Reaction not known  . Nalbuphine Other (See Comments)    States loss of consciousness REACTION: Reaction not known  . Oxycodone-Acetaminophen Other (See Comments) and Swelling  . Prednisone Nausea And Vomiting  . Prochlorperazine Edisylate     REACTION: shakes/tremors  . Restasis [Cyclosporine] Other (See Comments)    migraines  . Topamax [Topiramate] Diarrhea and Nausea And Vomiting  . Cefdinir Diarrhea, Nausea And Vomiting and Rash  . Neosporin [Neomycin-Bacitracin Zn-Polymyx] Rash  . Oxycodone-Acetaminophen Other (See Comments)    No past surgical history on file.  Social History   Socioeconomic History  . Marital status: Married    Spouse name: None  . Number of children: None  . Years of education: None  . Highest education level: None  Social Needs  . Financial resource strain: None  . Food insecurity - worry: None  . Food insecurity - inability: None  . Transportation needs - medical: None  . Transportation needs - non-medical: None  Occupational History  . None  Tobacco Use  . Smoking status: Never Smoker  . Smokeless tobacco: Never Used  Substance and Sexual Activity  . Alcohol use: No    Alcohol/week: 0.0 oz  . Drug use: No  . Sexual activity: Yes    Partners: Female  Other Topics Concern  .  None  Social History Narrative  . None    Family History  Problem Relation Age of Onset  . Hypertension Mother   . Hypertension Brother      ROS Review of Systems See HPI Constitution: No fevers or chills No malaise No diaphoresis Skin: No rash or itching Eyes: no blurry vision, no double vision GU: no dysuria or hematuria Neuro: no dizziness or headaches     Objective: Vitals:   02/21/17 1706  BP: 110/68  Pulse: 96  Resp: 16  Temp: 98.4 F (36.9 C)  TempSrc: Oral  SpO2: 98%  Weight: 142 lb (64.4 kg)  Height: 5' 9.75"  (1.772 m)    Physical Exam General: alert, oriented, in NAD Head: normocephalic, atraumatic, no sinus tenderness Eyes: EOM intact, no scleral icterus or conjunctival injection Ears: TM clear bilaterally Nose: mucosa nonerythematous, nonedematous Throat: no pharyngeal exudate or erythema Lymph: no posterior auricular, submental or cervical lymph adenopathy Heart: normal rate, normal sinus rhythm, no murmurs Lungs: clear to auscultation bilaterally, no wheezing   Assessment and Plan Markos was seen today for uri.  Diagnoses and all orders for this visit:  Acute URI- advised supportive care No antibiotic indicated  Diarrhea, unspecified type- continue [place]

## 2017-02-21 NOTE — Patient Instructions (Addendum)
   IF you received an x-ray today, you will receive an invoice from [facility]. Please contact [facility] at [phone] with questions or concerns regarding your invoice.   IF you received labwork today, you will receive an invoice from [name]. Please contact [name] at [phone] with questions or concerns regarding your invoice.   Our billing staff will not be able to assist you with questions regarding bills from these companies.  You will be contacted with the lab results as soon as they are available. The fastest way to get your results is to activate your [name] account. Instructions are located on the last page of this paperwork. If you have not heard from us regarding the results in 2 weeks, please contact this office.      Gastroparesis Gastroparesis, also called delayed gastric emptying, is a condition in which food takes longer than normal to empty from the stomach. The condition is usually long-lasting (chronic). What are the causes? This condition may be caused by:  An endocrine disorder, such as hypothyroidism or diabetes. Diabetes is the most common cause of this condition.  A nervous system disease, such as Parkinson disease or multiple sclerosis.  Cancer, infection, or surgery of the stomach or vagus nerve.  A connective tissue disorder, such as scleroderma.  Certain medicines.  In most cases, the cause is not known. What increases the risk? This condition is more likely to develop in:  People with certain disorders, including endocrine disorders, eating disorders, amyloidosis, and scleroderma.  People with certain diseases, including Parkinson disease or multiple sclerosis.  People with cancer or infection of the stomach or vagus nerve.  People who have had surgery on the stomach or vagus nerve.  People who take certain medicines.  Women.  What are the signs or symptoms? Symptoms of this condition include:  An early  feeling of fullness when eating.  Nausea.  Weight loss.  Vomiting.  Heartburn.  Abdominal bloating.  Inconsistent blood glucose levels.  Lack of appetite.  Acid from the stomach coming up into the esophagus (gastroesophageal reflux).  Spasms of the stomach.  Symptoms may come and go. How is this diagnosed? This condition is diagnosed with tests, such as:  Tests that check how long it takes food to move through the stomach and intestines. These tests include: ? Upper gastrointestinal (GI) series. In this test, X-rays of the intestines are taken after you drink a liquid. The liquid makes the intestines show up better on the X-rays. ? Gastric emptying scintigraphy. In this test, scans are taken after you eat food that contains a small amount of radioactive material. ? Wireless capsule GI monitoring system. This test involves swallowing a capsule that records information about movement through the stomach.  Gastric manometry. This test measures electrical and muscular activity in the stomach. It is done with a thin tube that is passed down the throat and into the stomach.  Endoscopy. This test checks for abnormalities in the lining of the stomach. It is done with a long, thin tube that is passed down the throat and into the stomach.  An ultrasound. This test can help rule out gallbladder disease or pancreatitis as a cause of your symptoms. It uses sound waves to take pictures of the inside of your body.  How is this treated? There is no cure for gastroparesis. This condition may be managed with:  Treatment of the underlying condition causing the gastroparesis.  Lifestyle changes, including exercise and dietary changes. Dietary changes   can include: ? Changes in what and when you eat. ? Eating smaller meals more often. ? Eating low-fat foods. ? Eating low-fiber forms of high-fiber foods, such as cooked vegetables instead of raw vegetables. ? Having liquid foods in place of  solid foods. Liquid foods are easier to digest.  Medicines. These may be given to control nausea and vomiting and to stimulate stomach muscles.  Getting food through a feeding tube. This may be done in severe cases.  A gastric neurostimulator. This is a device that is inserted into the body with surgery. It helps improve stomach emptying and control nausea and vomiting.  Follow these instructions at home:  Follow your health care provider's instructions about exercise and diet.  Take medicines only as directed by your health care provider. Contact a health care provider if:  Your symptoms do not improve with treatment.  You have new symptoms. Get help right away if:  You have severe abdominal pain that does not improve with treatment.  You have nausea that does not go away.  You cannot keep fluids down. This information is not intended to replace advice given to you by your health care provider. Make sure you discuss any questions you have with your health care provider. Document Released: 04/16/2005 Document Revised: 09/22/2015 Document Reviewed: 04/12/2014 Elsevier Interactive Patient Education  2018 [employer]vier Inc.  

## 2017-02-25 ENCOUNTER — Ambulatory Visit: Payer: PPO | Admitting: Family Medicine

## 2017-02-28 ENCOUNTER — Ambulatory Visit (INDEPENDENT_AMBULATORY_CARE_PROVIDER_SITE_OTHER): Payer: PPO | Admitting: Family Medicine

## 2017-02-28 DIAGNOSIS — R5382 Chronic fatigue, unspecified: Secondary | ICD-10-CM

## 2017-02-28 DIAGNOSIS — R7989 Other specified abnormal findings of blood chemistry: Secondary | ICD-10-CM

## 2017-03-01 NOTE — Progress Notes (Signed)
Pt not seen.

## 2017-03-03 LAB — TESTT+TESTF+SHBG
SEX HORMONE BINDING: 39.9 nmol/L (ref 19.3–76.4)
TESTOSTERONE FREE: 7.3 pg/mL (ref 7.2–24.0)
Testosterone, total: 449.9 ng/dL (ref 264.0–916.0)

## 2017-03-06 ENCOUNTER — Encounter: Payer: Self-pay | Admitting: Family Medicine

## 2017-03-06 ENCOUNTER — Ambulatory Visit: Payer: PPO | Admitting: Family Medicine

## 2017-03-06 ENCOUNTER — Other Ambulatory Visit: Payer: Self-pay

## 2017-03-06 VITALS — BP 112/74 | HR 92 | Temp 98.8°F | Resp 16 | Ht 69.75 in | Wt 140.4 lb

## 2017-03-06 DIAGNOSIS — S46011D Strain of muscle(s) and tendon(s) of the rotator cuff of right shoulder, subsequent encounter: Secondary | ICD-10-CM

## 2017-03-06 DIAGNOSIS — R801 Persistent proteinuria, unspecified: Secondary | ICD-10-CM

## 2017-03-06 DIAGNOSIS — M6289 Other specified disorders of muscle: Secondary | ICD-10-CM

## 2017-03-06 DIAGNOSIS — Z23 Encounter for immunization: Secondary | ICD-10-CM | POA: Diagnosis not present

## 2017-03-06 DIAGNOSIS — R800 Isolated proteinuria: Secondary | ICD-10-CM

## 2017-03-06 DIAGNOSIS — R109 Unspecified abdominal pain: Secondary | ICD-10-CM | POA: Diagnosis not present

## 2017-03-06 DIAGNOSIS — G8929 Other chronic pain: Secondary | ICD-10-CM

## 2017-03-06 DIAGNOSIS — R3129 Other microscopic hematuria: Secondary | ICD-10-CM | POA: Diagnosis not present

## 2017-03-06 DIAGNOSIS — R5382 Chronic fatigue, unspecified: Secondary | ICD-10-CM | POA: Diagnosis not present

## 2017-03-06 DIAGNOSIS — G43819 Other migraine, intractable, without status migrainosus: Secondary | ICD-10-CM

## 2017-03-06 LAB — POCT URINALYSIS DIP (MANUAL ENTRY)
BILIRUBIN UA: NEGATIVE
BILIRUBIN UA: NEGATIVE mg/dL
GLUCOSE UA: NEGATIVE mg/dL
Leukocytes, UA: NEGATIVE
Nitrite, UA: NEGATIVE
Protein Ur, POC: 100 mg/dL — AB
Spec Grav, UA: 1.025 (ref 1.010–1.025)
Urobilinogen, UA: 0.2 E.U./dL
pH, UA: 6 (ref 5.0–8.0)

## 2017-03-06 MED ORDER — LIFITEGRAST 5 % OP SOLN: 1.0000 [drp] | Freq: Two times a day (BID) | OPHTHALMIC | Status: DC

## 2017-03-06 NOTE — Patient Instructions (Addendum)
I don't see that the xiidra is related to any of the medications that currently seem to trigger your migraines so I think it would be safe to try once to twice a day.   Call the [facility] outpatient rehab center at [facility] to schedule the pelvic floor physical therapy appointment with Earlie Counts.  Address: [address], [place], [place] [address] Phone: [phone] [email or web address]      IF you received an x-ray today, you will receive an invoice from [facility]. Please contact [facility] at [phone] with questions or concerns regarding your invoice.   IF you received labwork today, you will receive an invoice from [facility]. Please contact [name] at [phone] with questions or concerns regarding your invoice.   Our billing staff will not be able to assist you with questions regarding bills from these companies.  You will be contacted with the lab results as soon as they are available. The fastest way to get your results is to activate your [name] account. Instructions are located on the last page of this paperwork. If you have not heard from [place] regarding the results in 2 weeks, please contact this office.

## 2017-03-06 NOTE — Progress Notes (Addendum)
Subjective:  By signing my name below, I, William [name], attest that this documentation has been prepared under the direction and in the presence of [name], MD Electronically Signed: Ladene Artist, ED Scribe 03/06/2017 at 9:18 AM.   Patient ID: William [name], male    DOB: 08/12/1960, 56 y.o.   MRN: [id number]  Chief Complaint  Patient presents with  . Follow-up   HPI William [name] is a 56 y.o. male who presents to Primary Care at [facility] for follow-up. Pt has started going to seminars since his last visit. States he is starting to realize that he has to take care of himself in order to be a caregiver for his wife and his mother as she is about to begin Fosamax.  High Proteinuria He had developed proteinuria, mild to moderate range approximately 1 year ago. He's seen 2 different urologists and nephrologists with no abnormality or etiology found. His levels has been ranging in the 300s. Saw nephrology who reccommended low dose ACE/ARB for renal protection but pt could not tolerate even a microscopic amount due to hypotension.  Chronic Fatigue Have encouraged TCSM/biofeedback prior but cost limiting. H/o EBV.  Chronic HAs Alternates Tramadol and Fioricet during flares. Discussed resuming oxygen therapy for acute migraine which he responded well to prior but concerned about insurance coverage. Pt states HAs are still intermittent but have improved since sinusitis has improved, although he still reports some cough, sneezing and dry eyes. He takes 1-2 Tramadol tablets a week since he is unsure of side-effects but has noticed it provides significant relief. Pt rarely takes Fioricet due to the caffeine component.   Vitamin D Deficiency  On high dose once weekly 6 month course through March 2019, then change to 2000 units OTC. Pt resumed Vitamin D to see if it would increase his energy level but states he missed the last 2 weeks.   Pelvic Floor Dysfunction Resulting in L  Testicular Pain Both myself and urology have repeated the recommended PT. Pt is still experiencing L testicular pain, L flank pain and itching.  Chronic LLQ Abdominal Pain Saw GI who attempted colonoscopy twice but couldn't get good cleanout - recommended cologuard for future colon cancer screening.  R Shoulder Pain Pt also reports that he is still having R shoulder pain s/p a fall that occurred over a month ago.   Past Medical History:  Diagnosis Date  . Allergy   . Anxiety   . Depression   . Gastroparesis   . Heart murmur   . Migraine headache   . Mitral valve prolapse    Current Outpatient Medications on File Prior to Visit  Medication Sig Dispense Refill  . butalbital-acetaminophen-caffeine (FIORICET, ESGIC) 50-325-40 MG tablet Take 1-2 tablets by mouth every 4 (four) hours as needed for headache. 40 tablet 0  . ondansetron (ZOFRAN) 8 MG tablet Take 1 tablet (8 mg total) by mouth every 8 (eight) hours as needed for nausea or vomiting. 20 tablet 0  . traMADol (ULTRAM) 50 MG tablet Take 1 tablet (50 mg total) by mouth every 8 (eight) hours as needed. 25 tablet 2  . Vitamin D, Ergocalciferol, (DRISDOL) 50000 units CAPS capsule Take 1 capsule (50,000 Units total) by mouth every 7 (seven) days. 24 capsule 0   No current facility-administered medications on file prior to visit.    Allergies  Allergen Reactions  . Erythromycin Nausea And Vomiting and Anaphylaxis  . Hydrocodone-Acetaminophen Nausea And Vomiting  . Tramadol Nausea And Vomiting  .  Cephalexin Diarrhea and Nausea And Vomiting  . Cephalosporins Diarrhea and Nausea And Vomiting  . Compazine [Prochlorperazine Edisylate]     uncontrollable shake - tremor  . Ketoconazole Itching  . Ketorolac Nausea And Vomiting  . Ketorolac Tromethamine     REACTION: Reaction not known  . Latex Itching    REACTION: Reaction not known  . Nalbuphine Other (See Comments)    States loss of consciousness REACTION: Reaction not known  .  Oxycodone-Acetaminophen Other (See Comments) and Swelling  . Prednisone Nausea And Vomiting  . Prochlorperazine Edisylate     REACTION: shakes/tremors  . Restasis [Cyclosporine] Other (See Comments)    migraines  . Topamax [Topiramate] Diarrhea and Nausea And Vomiting  . Cefdinir Diarrhea, Nausea And Vomiting and Rash  . Neosporin [Neomycin-Bacitracin Zn-Polymyx] Rash  . Oxycodone-Acetaminophen Other (See Comments)   History reviewed. No pertinent surgical history. Family History  Problem Relation Age of Onset  . Hypertension Mother   . Hypertension Brother    Social History   Socioeconomic History  . Marital status: Married    Spouse name: None  . Number of children: None  . Years of education: None  . Highest education level: None  Social Needs  . Financial resource strain: None  . Food insecurity - worry: None  . Food insecurity - inability: None  . Transportation needs - medical: None  . Transportation needs - non-medical: None  Occupational History  . None  Tobacco Use  . Smoking status: Never Smoker  . Smokeless tobacco: Never Used  Substance and Sexual Activity  . Alcohol use: No    Alcohol/week: 0.0 oz  . Drug use: No  . Sexual activity: Yes    Partners: Female  Other Topics Concern  . None  Social History Narrative  . None   Depression screen [facility] 2/9 03/06/2017 02/21/2017 01/24/2017 01/10/2017 12/29/2016  Decreased Interest 0 0 0 0 0  Down, Depressed, Hopeless 0 0 0 0 0  PHQ - 2 Score 0 0 0 0 0  Altered sleeping - - - - -  Tired, decreased energy - - - - -  Change in appetite - - - - -  Feeling bad or failure about yourself  - - - - -  Trouble concentrating - - - - -  Moving slowly or fidgety/restless - - - - -  Suicidal thoughts - - - - -  PHQ-9 Score - - - - -  Some recent data might be hidden    Review of Systems  Constitutional: Positive for fatigue.  HENT: Positive for sneezing.   Respiratory: Positive for cough.   Genitourinary: Positive for  flank pain and testicular pain.  Musculoskeletal: Positive for arthralgias.  Neurological: Positive for headaches (intermittent).      Objective:   Physical Exam  Constitutional: He is oriented to person, place, and time. He appears well-developed and well-nourished. No distress.  HENT:  Head: Normocephalic and atraumatic.  Right Ear: Tympanic membrane normal.  Left Ear: Tympanic membrane normal.  Mouth/Throat: Uvula is midline and mucous membranes are normal.  Nares: pale, boggy mucosa Oropharynx: Mild, postnasal drip  Eyes: Conjunctivae and EOM are normal.  Neck: Neck supple. No tracheal deviation present. No thyroid mass and no thyromegaly present.  Cardiovascular: Normal rate, regular rhythm, S1 normal, S2 normal and normal heart sounds.  Pulmonary/Chest: Effort normal. No respiratory distress.  Lungs clear. Good air movement.  Musculoskeletal: Normal range of motion.  Lymphadenopathy:    He has no cervical  adenopathy.  Neurological: He is alert and oriented to person, place, and time.  Skin: Skin is warm and dry.  Psychiatric: He has a normal mood and affect. His behavior is normal.  Nursing note and vitals reviewed.  BP 112/74   Pulse 92   Temp 98.8 F (37.1 C)   Resp 16   Ht 5' 9.75" (1.772 m)   Wt 140 lb 6.4 oz (63.7 kg)   SpO2 99%   BMI 20.29 kg/m    Results for orders placed or performed in visit on 03/06/17  POCT urinalysis dipstick  Result Value Ref Range   Color, UA yellow yellow   Clarity, UA clear clear   Glucose, UA negative negative mg/dL   Bilirubin, UA negative negative   Ketones, POC UA negative negative mg/dL   Spec Grav, UA 1.025 1.010 - 1.025   Blood, UA small (A) negative   pH, UA 6.0 5.0 - 8.0   Protein Ur, POC =100 (A) negative mg/dL   Urobilinogen, UA 0.2 0.2 or 1.0 E.U./dL   Nitrite, UA Negative Negative   Leukocytes, UA Negative Negative      Assessment & Plan:   1. Chronic fatigue - wanting to look into vocational rehab - will  ask for help from [facility] connected care team - Ana  2. Persistent proteinuria - could not tolerated even lisinopril 2.5 due to severe orthostatatic hypotension sxs; quantities improving overtime, no need to f/u with Dr. Hollie Salk at [facility] at this time unless urine findings worsen. Has standing orders for recheck on lab-only visit if he desires - if very anxious about the monitoring of this and freq.  3. Isolated proteinuria without specific morphologic lesion   4. Pelvic floor dysfunction - needs to start pelvic floor PT - call [facility] outpt rehab at [facility] for appt w/ Austin Miles  5. Microscopic hematuria - on dip only, not seen under microscopy.  6. Rotator cuff strain, right, subsequent encounter - appt tomorrow with [place] rehab at [facility]  7. Chronic left flank pain - appt tomorrow with [place] rehab at [place]. Need for prophylactic vaccination and inoculation against influenza     Orders Placed This Encounter  Procedures  . Flu Vaccine QUAD 36+ mos IM  . Microalbumin/Creatinine Ratio, Urine  . Urinalysis, microscopic only  . Ambulatory referral to Physical Therapy    Referral Priority:   Routine    Referral Type:   Physical Medicine    Referral Reason:   Specialty Services Required    Requested Specialty:   Physical Therapy    Number of Visits Requested:   1  . POCT urinalysis dipstick    Meds ordered this encounter  Medications  . Lifitegrast (XIIDRA) 5 % SOLN    Sig: Apply 1 drop 2 (two) times daily to eye.   Over 40 min spent in face-to-face evaluation of and consultation with patient and coordination of care.  Over 50% of this time was spent counseling this patient regarding vocational rehab, self-care, physical therapy, persistence of proteinuria and significant reassurance about all of these things..  I personally performed the services described in this documentation, which was scribed in my presence. The recorded information has been reviewed and considered, and addended by  me as needed.   [name], M.D.  Primary Care at [facility] [address] [place], [place] [address] [phone] phone [phone] fax  03/08/17 4:06 PM

## 2017-03-07 DIAGNOSIS — S46011D Strain of muscle(s) and tendon(s) of the rotator cuff of right shoulder, subsequent encounter: Secondary | ICD-10-CM | POA: Diagnosis not present

## 2017-03-07 DIAGNOSIS — M545 Low back pain: Secondary | ICD-10-CM | POA: Diagnosis not present

## 2017-03-07 DIAGNOSIS — R109 Unspecified abdominal pain: Secondary | ICD-10-CM | POA: Diagnosis not present

## 2017-03-07 LAB — URINALYSIS, MICROSCOPIC ONLY
CASTS: NONE SEEN /LPF
EPITHELIAL CELLS (NON RENAL): NONE SEEN /HPF (ref 0–10)
RBC, UA: NONE SEEN /hpf (ref 0–?)

## 2017-03-07 LAB — MICROALBUMIN / CREATININE URINE RATIO
Creatinine, Urine: 223.3 mg/dL
MICROALBUM., U, RANDOM: 287.9 ug/mL
Microalb/Creat Ratio: 128.9 mg/g creat — ABNORMAL HIGH (ref 0.0–30.0)

## 2017-03-18 ENCOUNTER — Other Ambulatory Visit: Payer: Self-pay

## 2017-03-18 ENCOUNTER — Encounter: Payer: Self-pay | Admitting: Family Medicine

## 2017-03-18 ENCOUNTER — Ambulatory Visit: Payer: PPO | Admitting: Family Medicine

## 2017-03-18 VITALS — BP 108/66 | HR 108 | Temp 98.3°F | Resp 17 | Ht 69.75 in | Wt 140.0 lb

## 2017-03-18 DIAGNOSIS — R509 Fever, unspecified: Secondary | ICD-10-CM | POA: Diagnosis not present

## 2017-03-18 DIAGNOSIS — R112 Nausea with vomiting, unspecified: Secondary | ICD-10-CM | POA: Diagnosis not present

## 2017-03-18 LAB — POCT INFLUENZA A/B
Influenza A, POC: NEGATIVE
Influenza B, POC: NEGATIVE

## 2017-03-18 MED ORDER — FLUTICASONE PROPIONATE 50 MCG/ACT NA SUSP: 2.0000 | Freq: Every day | NASAL | 6 refills | Status: DC

## 2017-03-18 NOTE — Progress Notes (Signed)
Chief Complaint  Patient presents with  . Emesis    onset: Friday, warm and hot to touch no temps taken  . Headache    onset: Friday, warm and hot to touch no temps taken  . Fatigue    onset: Friday, warm and hot to touch no temps taken    HPI  Pt with onset of emesis, frontal headache and fatigue with fevers and chills that started on Friday 03/15/2017 He took tylenol on Friday for fevers and tramadol for headache He reports some lightheadedness  He has been using raid for ants He has been using lots of cleaners like windex, chlorox bleach and twice a week he has been cleaning the house and shower with strong chemicals   4 review of systems  Past Medical History:  Diagnosis Date  . Allergy   . Anxiety   . Depression   . Gastroparesis   . Heart murmur   . Migraine headache   . Mitral valve prolapse     Current Outpatient Medications  Medication Sig Dispense Refill  . butalbital-acetaminophen-caffeine (FIORICET, ESGIC) 50-325-40 MG tablet Take 1-2 tablets by mouth every 4 (four) hours as needed for headache. 40 tablet 0  . Lifitegrast (XIIDRA) 5 % SOLN Apply 1 drop 2 (two) times daily to eye.    . ondansetron (ZOFRAN) 8 MG tablet Take 1 tablet (8 mg total) by mouth every 8 (eight) hours as needed for nausea or vomiting. 20 tablet 0  . traMADol (ULTRAM) 50 MG tablet Take 1 tablet (50 mg total) by mouth every 8 (eight) hours as needed. 25 tablet 2  . Vitamin D, Ergocalciferol, (DRISDOL) 50000 units CAPS capsule Take 1 capsule (50,000 Units total) by mouth every 7 (seven) days. 24 capsule 0  . fluticasone (FLONASE) 50 MCG/ACT nasal spray Place 2 sprays daily into both nostrils. 16 g 6   No current facility-administered medications for this visit.     Allergies:  Allergies  Allergen Reactions  . Erythromycin Nausea And Vomiting and Anaphylaxis  . Hydrocodone-Acetaminophen Nausea And Vomiting  . Tramadol Nausea And Vomiting  . Cephalexin Diarrhea and Nausea And Vomiting   . Cephalosporins Diarrhea and Nausea And Vomiting  . Compazine [Prochlorperazine Edisylate]     uncontrollable shake - tremor  . Ketoconazole Itching  . Ketorolac Nausea And Vomiting  . Ketorolac Tromethamine     REACTION: Reaction not known  . Latex Itching    REACTION: Reaction not known  . Nalbuphine Other (See Comments)    States loss of consciousness REACTION: Reaction not known  . Oxycodone-Acetaminophen Other (See Comments) and Swelling  . Prednisone Nausea And Vomiting  . Prochlorperazine Edisylate     REACTION: shakes/tremors  . Restasis [Cyclosporine] Other (See Comments)    migraines  . Topamax [Topiramate] Diarrhea and Nausea And Vomiting  . Cefdinir Diarrhea, Nausea And Vomiting and Rash  . Neosporin [Neomycin-Bacitracin Zn-Polymyx] Rash  . Oxycodone-Acetaminophen Other (See Comments)    No past surgical history on file.  Social History   Socioeconomic History  . Marital status: Married    Spouse name: None  . Number of children: None  . Years of education: None  . Highest education level: None  Social Needs  . Financial resource strain: None  . Food insecurity - worry: None  . Food insecurity - inability: None  . Transportation needs - medical: None  . Transportation needs - non-medical: None  Occupational History  . None  Tobacco Use  . Smoking status: Never Smoker  .  Smokeless tobacco: Never Used  Substance and Sexual Activity  . Alcohol use: No    Alcohol/week: 0.0 oz  . Drug use: No  . Sexual activity: Yes    Partners: Female  Other Topics Concern  . None  Social History Narrative  . None    Family History  Problem Relation Age of Onset  . Hypertension Mother   . Hypertension Brother      Review of Systems  Constitutional:       See hpi  HENT: Positive for congestion, ear pain and sinus pain. Negative for sore throat and tinnitus.   Respiratory: Negative for cough, shortness of breath and stridor.   Cardiovascular: Negative for  chest pain, palpitations and orthopnea.  Neurological: Negative for dizziness and headaches.  Psychiatric/Behavioral: Negative for depression. The patient is not nervous/anxious.        Objective: Vitals:   03/18/17 1119  BP: 108/66  Pulse: (!) 108  Resp: 17  Temp: 98.3 F (36.8 C)  TempSrc: Oral  SpO2: 96%  Weight: 140 lb (63.5 kg)  Height: 5' 9.75" (1.772 m)    Physical Exam General: alert, oriented, in NAD Head: normocephalic, atraumatic, no sinus tenderness Eyes: EOM intact, no scleral icterus or conjunctival injection Ears: TM clear bilaterally Nose: mucosa nonerythematous, nonedematous Throat: no pharyngeal exudate or erythema Lymph: no posterior auricular, submental or cervical lymph adenopathy Heart: normal rate, normal sinus rhythm, no murmurs Lungs: clear to auscultation bilaterally, no wheezing   Assessment and Plan William [name] was seen today for emesis, headache and fatigue.  Diagnoses and all orders for this visit:  Fever and chills -     POCT Influenza A/B  Intractable vomiting with nausea, unspecified vomiting type -     POCT Influenza A/B  Other orders -     Cancel: Ambulatory referral to Gastroenterology -     fluticasone (FLONASE) 50 MCG/ACT nasal spray; Place 2 sprays daily into both nostrils.   Concerning for chemical irritation Advised not to use multiple cleaners in a confined space like the tub Advised trying to use less He should also air out the room   [place]

## 2017-03-18 NOTE — Patient Instructions (Addendum)
   IF you received an x-ray today, you will receive an invoice from [facility]. Please contact [facility] at [phone] with questions or concerns regarding your invoice.   IF you received labwork today, you will receive an invoice from [name]. Please contact [name] at [phone] with questions or concerns regarding your invoice.   Our billing staff will not be able to assist you with questions regarding bills from these companies.  You will be contacted with the lab results as soon as they are available. The fastest way to get your results is to activate your [name] account. Instructions are located on the last page of this paperwork. If you have not heard from us regarding the results in 2 weeks, please contact this office.      Nausea, Adult Nausea is the feeling of an upset stomach or having to vomit. Nausea on its own is not usually a serious concern, but it may be an early sign of a more serious medical problem. As nausea gets worse, it can lead to vomiting. If vomiting develops, or if you are not able to drink enough fluids, you are at risk of becoming dehydrated. Dehydration can make you tired and thirsty, cause you to have a dry mouth, and decrease how often you urinate. Older adults and people with other diseases or a weak immune system are at higher risk for dehydration. The main goals of treating your nausea are:  To limit repeated nausea episodes.  To prevent vomiting and dehydration.  Follow these instructions at home: Follow instructions from your health care provider about how to care for yourself at home. Eating and drinking Follow these recommendations as told by your health care provider:  Take an oral rehydration solution (ORS). This is a drink that is sold at pharmacies and retail stores.  Drink clear fluids in small amounts as you are able. Clear fluids include water, ice chips, diluted fruit juice, and low-calorie sports  drinks.  Eat bland, easy-to-digest foods in small amounts as you are able. These foods include bananas, applesauce, rice, lean meats, toast, and crackers.  Avoid drinking fluids that contain a lot of sugar or caffeine, such as energy drinks, sports drinks, and soda.  Avoid alcohol.  Avoid spicy or fatty foods.  General instructions  Drink enough fluid to keep your urine clear or pale yellow.  Wash your hands often. If soap and water are not available, use hand sanitizer.  Make sure that all people in your household wash their hands well and often.  Rest at home while you recover.  Take over-the-counter and prescription medicines only as told by your health care provider.  Breathe slowly and deeply when you feel nauseous.  Watch your condition for any changes.  Keep all follow-up visits as told by your health care provider. This is important. Contact a health care provider if:  You have a headache.  You have new symptoms.  Your nausea gets worse.  You have a fever.  You feel light-headed or dizzy.  You vomit.  You cannot keep fluids down. Get help right away if:  You have pain in your chest, neck, arm, or jaw.  You feel extremely weak or you faint.  You have vomit that is bright red or looks like coffee grounds.  You have bloody or black stools or stools that look like tar.  You have a severe headache, a stiff neck, or both.  You have severe pain, cramping, or bloating in your abdomen.    You have a rash.  You have difficulty breathing or are breathing very quickly.  Your heart is beating very quickly.  Your skin feels cold and clammy.  You feel confused.  You have pain when you urinate.  You have signs of dehydration, such as: ? Dark urine, very little, or no urine. ? Cracked lips. ? Dry mouth. ? Sunken eyes. ? Sleepiness. ? Weakness. These symptoms may represent a serious problem that is an emergency. Do not wait to see if the symptoms will go away. Get  medical help right away. Call your local emergency services (911 in the [place] Do not drive yourself to the hospital. This information is not intended to replace advice given to you by your health care provider. Make sure you discuss any questions you have with your health care provider. Document Released: 05/24/2004 Document Revised: 09/19/2015 Document Reviewed: 12/21/2014 Elsevier Interactive Patient Education  2017 [place] Inhalation Injury A chemical inhalation injury is an internal injury, such as lung damage, that results from breathing in fumes of a chemical or harmful substance (toxic agent). Chemical inhalation injuries most often occur:  During fires, when materials that are burned release chemicals into the environment.  During work accidents, when large quantities of toxic chemicals are spilled at [employer] or industrial sites. Chemical inhalation injuries vary in severity. An injury tends to be more severe:  The more acidic or alkaline the chemical is.  The more concentrated the substance is.  The longer you are exposed to the substance. RISK FACTORS You are at a high risk for a chemical inhalation injury if you:  Are exposed to burning materials.  Work with chemicals, solvents, or cleaners. SIGNS AND SYMPTOMS Symptoms of a chemical inhalation injury may include:  Hoarse voice.  Shortness of breath or trouble breathing.  Chest pain.  Pale or blue skin.  Mucus production.  Cough.  Weakness.  Dizziness or fainting. DIAGNOSIS Most chemical inhalation injuries can be diagnosed with a physical exam and medical history. Tests may be done to check for lung damage. They may include:  A blood oxygen level test.  A chest X-ray.  Pulmonary function tests. There are no tests to identify the specific chemical or substance that caused the injury. TREATMENT  There is no specific treatment for a chemical inhalation injury. Most treatment is directed at  improving the ability of the lungs to deliver oxygen to the body. Time is needed for lung tissue to heal. Supportive treatment may include:  Aerosol treatments to decrease swelling in the airways.  Suctioning of the airways to remove excess mucus.  Supplemental oxygen. HOME CARE INSTRUCTIONS  Do not use any tobacco products, including cigarettes, chewing tobacco, or electronic cigarettes. If you need help quitting, ask your health care provider.  Do not allow yourself to be exposed to any airway irritants, such as cigarette smoke or smoke from a fireplace.  Follow your health care provider's instructions for the use of any inhalers.  Take medicines only as directed by your health care provider.  Keep all follow-up visits as directed by your health care provider. This is important. SEEK MEDICAL CARE IF:  Your symptoms are not improving as your health care provider predicted. SEEK IMMEDIATE MEDICAL CARE IF:  Your symptoms get worse.  You have increasing shortness of breath or wheezing.  Your skin or your lips appear very pale or blue.  You have a persistent cough.  You cough up blood or dark material.  You have chest pain  or weakness.  You have a fever.  You faint. This information is not intended to replace advice given to you by your health care provider. Make sure you discuss any questions you have with your health care provider. Document Released: 12/18/2013 Document Reviewed: 12/18/2013 Elsevier Interactive Patient Education  2017 [employer].

## 2017-03-20 ENCOUNTER — Encounter: Payer: Self-pay | Admitting: Family Medicine

## 2017-03-25 DIAGNOSIS — R109 Unspecified abdominal pain: Secondary | ICD-10-CM | POA: Diagnosis not present

## 2017-03-25 DIAGNOSIS — S46011D Strain of muscle(s) and tendon(s) of the rotator cuff of right shoulder, subsequent encounter: Secondary | ICD-10-CM | POA: Diagnosis not present

## 2017-03-25 DIAGNOSIS — M545 Low back pain: Secondary | ICD-10-CM | POA: Diagnosis not present

## 2017-03-25 NOTE — Telephone Encounter (Signed)
Pt. Stopped in to the office today to follow up on the above e-mail. He is wanting PSA Blood Test but have not heard back from [facility] as of yet.   Please advise pt. At [phone]

## 2017-03-26 ENCOUNTER — Encounter: Payer: Self-pay | Admitting: Physician Assistant

## 2017-03-26 ENCOUNTER — Ambulatory Visit (INDEPENDENT_AMBULATORY_CARE_PROVIDER_SITE_OTHER): Payer: PPO | Admitting: Physician Assistant

## 2017-03-26 VITALS — BP 104/64 | HR 83 | Resp 16 | Ht 70.0 in | Wt 141.8 lb

## 2017-03-26 DIAGNOSIS — I059 Rheumatic mitral valve disease, unspecified: Secondary | ICD-10-CM

## 2017-03-26 DIAGNOSIS — R002 Palpitations: Secondary | ICD-10-CM

## 2017-03-26 DIAGNOSIS — E782 Mixed hyperlipidemia: Secondary | ICD-10-CM

## 2017-03-26 MED ORDER — EZETIMIBE 10 MG PO TABS: 10.0000 mg | ORAL_TABLET | Freq: Every day | ORAL | 3 refills | Status: DC

## 2017-03-26 MED ORDER — DILTIAZEM HCL 30 MG PO TABS: 30.0000 mg | ORAL_TABLET | ORAL | 11 refills | Status: DC | PRN

## 2017-03-26 NOTE — Patient Instructions (Signed)
Medication Instructions:  1. START ZETIA 10 MG DAILY; RX HAS BEEN SENT IN  2. AN RX HAS BEEN SENT IN FOR CARDIZEM 30 MG TABLET WITH THE DIRECTIONS TO TAKE AS NEEDED FOR PALPITATIONS  Labwork: FASTING LIPID AND LIVER PANEL 6 WEEKS 05/07/17  Testing/Procedures: Your physician has recommended that you wear an event monitor. Event monitors are medical devices that record the heart's electrical activity. Doctors most often [place] these monitors to diagnose arrhythmias. Arrhythmias are problems with the speed or rhythm of the heartbeat. The monitor is a small, portable device. You can wear one while you do your normal daily activities. This is usually used to diagnose what is causing palpitations/syncope (passing out).     Follow-Up: DR. Angelena Form IN 2 MONTHS   Any Other Special Instructions Will Be Listed Below (If Applicable).     If you need a refill on your cardiac medications before your next appointment, please call your pharmacy.

## 2017-03-26 NOTE — Progress Notes (Signed)
Cardiology Office Note    Date:  03/26/2017   ID:  William [name], DOB May 06, 1960, MRN [id number]  PCP:  Shawnee Knapp, MD  Cardiologist: Dr. Angelena Form  Chief Complaint  Patient presents with  . Follow-up    palpitations     History of Present Illness:  William [name] is a 56 y.o. male with history of palpitations/PVCs, previous diagnosis of MVP in 1989.  Has not tolerated beta-blockers in the past.  Has been on Cardizem since 2011.  Had a normal echo and stress test in 2011.  Holter monitor at that time no evidence of SVT or atrial fibrillation.  Chest pain and high 0.06/2012 CTA of the chest negative for PE.  Sent to [facility] troponins negative, 48-hour monitor showed normal sinus rhythm with rare PACs and echo showed normal LV size and function.  Last saw Dr. Angelena Form 03/2014 and was doing well.  Patient comes in today after a 3-year absence.  He was at [facility] urgent care last week with upper respiratory symptoms and the doctor noticed his heart was going fast and he was told to do some deep breathing.  They felt he was a bit dehydrated and told him to drink more fluids.  He has noticed his palpitations have returned over the past several months.  He says his heart starts racing with activity or bending over and then settles down.  It occurs 1-2 times monthly.  He denies any associated chest pain, dyspnea, dizziness or presyncope.  He cannot exercise because of migraines and is disabled because of them.  He is followed regularly at [facility] urgent care and recently has fasting lipid panel was elevated but he was not started on any medication because he has so many side effects from meds.  He was supposed to exercise but is unable to.  He has had trouble with protein in his urine and is followed by a local renal physician.  He was in tolerant of the medication she prescribed.    Past Medical History:  Diagnosis Date  . Allergy   . Anxiety   . Depression   . Gastroparesis   .  Heart murmur   . Migraine headache   . Mitral valve prolapse     No past surgical history on file.  Current Medications: Current Meds  Medication Sig  . butalbital-acetaminophen-caffeine (FIORICET, ESGIC) 50-325-40 MG tablet Take 1-2 tablets by mouth every 4 (four) hours as needed for headache.  . fluticasone (FLONASE) 50 MCG/ACT nasal spray Place 2 sprays daily into both nostrils.  [name] Lifitegrast (XIIDRA) 5 % SOLN Apply 1 drop 2 (two) times daily to eye.  . ondansetron (ZOFRAN) 8 MG tablet Take 1 tablet (8 mg total) by mouth every 8 (eight) hours as needed for nausea or vomiting.  . traMADol (ULTRAM) 50 MG tablet Take 1 tablet (50 mg total) by mouth every 8 (eight) hours as needed.  . Vitamin D, Ergocalciferol, (DRISDOL) 50000 units CAPS capsule Take 1 capsule (50,000 Units total) by mouth every 7 (seven) days.     Allergies:   Erythromycin; Hydrocodone-acetaminophen; Tramadol; Cephalexin; Cephalosporins; Compazine [prochlorperazine edisylate]; Ketoconazole; Ketorolac; Ketorolac tromethamine; Latex; Nalbuphine; Oxycodone-acetaminophen; Prednisone; Prochlorperazine edisylate; Restasis [cyclosporine]; Topamax [topiramate]; Cefdinir; Neosporin [neomycin-bacitracin zn-polymyx]; and Oxycodone-acetaminophen   Social History   Socioeconomic History  . Marital status: Married    Spouse name: None  . Number of children: None  . Years of education: None  . Highest education level: None  Social Needs  . Financial  resource strain: None  . Food insecurity - worry: None  . Food insecurity - inability: None  . Transportation needs - medical: None  . Transportation needs - non-medical: None  Occupational History  . None  Tobacco Use  . Smoking status: Never Smoker  . Smokeless tobacco: Never Used  Substance and Sexual Activity  . Alcohol use: No    Alcohol/week: 0.0 oz  . Drug use: No  . Sexual activity: Yes    Partners: Female  Other Topics Concern  . None  Social History Narrative    . None     Family History:  The patient's family history includes Hypertension in his brother and mother.   ROS:   Please see the history of present illness.    Review of Systems  Constitution: Positive for malaise/fatigue.  HENT: Negative.   Cardiovascular: Positive for palpitations.  Respiratory: Negative.   Endocrine: Negative.   Hematologic/Lymphatic: Negative.   Musculoskeletal: Positive for back pain.  Gastrointestinal: Positive for abdominal pain and diarrhea.  Genitourinary: Negative.   Neurological: Positive for headaches.  Psychiatric/Behavioral: The patient is nervous/anxious.    All other systems reviewed and are negative.   PHYSICAL EXAM:   VS:  BP 104/64   Pulse 83   Resp 16   Ht 5\' 10"  (1.778 m)   Wt 141 lb 12.8 oz (64.3 kg)   SpO2 98%   BMI 20.35 kg/m   Physical Exam  GEN: Well nourished, well developed, in no acute distress  Neck: no JVD, carotid bruits, or masses Cardiac:RRR; no murmurs, rubs, or gallops  Respiratory:  clear to auscultation bilaterally, normal work of breathing GI: soft, nontender, nondistended, + BS Ext: without cyanosis, clubbing, or edema, Good distal pulses bilaterally Neuro:  Alert and Oriented x 3, Psych: euthymic mood, full affect  Wt Readings from Last 3 Encounters:  03/26/17 141 lb 12.8 oz (64.3 kg)  03/18/17 140 lb (63.5 kg)  03/06/17 140 lb 6.4 oz (63.7 kg)      Studies/Labs Reviewed:   EKG:  EKG is  ordered today.  The ekg ordered today demonstrates normal sinus rhythm normal EKG  Recent Labs: 09/28/2016: Platelets 281 12/29/2016: ALT 10; BUN 9; Creatinine, Ser 1.06; Hemoglobin 15.1; Potassium 4.4; Sodium 138; TSH 0.634   Lipid Panel    Component Value Date/Time   CHOL 233 (H) 12/29/2016 1036   TRIG 157 (H) 12/29/2016 1036   HDL 52 12/29/2016 1036   CHOLHDL 4.5 12/29/2016 1036   CHOLHDL 3.7 05/14/2015 0958   VLDL 25 05/14/2015 0958   LDLCALC 150 (H) 12/29/2016 1036    Additional studies/ records that were  reviewed today include:  2D echo 2014  Echo 07/10/12: Left ventricle: The cavity size was normal. Wall thickness was normal. Systolic function was normal. The estimated ejection fraction was in the range of 60% to 65%. Wall motion was normal; there were no regional wall motion abnormalities. Left ventricular diastolic function parameters were normal.  CTA 2014IMPRESSION:   1.  No evidence of pulmonary embolus. 2.  Minimal airspace opacity in the periphery of the right upper lobe, concerning for mild pneumonia.       ASSESSMENT:    1. Palpitations   2. Mixed hyperlipidemia   3. Mitral valve disease      PLAN:  In order of problems listed above:  Palpitations/PVCs history of with increasing symptoms over the past couple months.  He has not had Cardizem to take as needed.  We will give him Cardizem  30 mg as needed.  Schedule an event monitor to rule out any significant arrhythmias.  Avoid stimulants.  Follow-up with Dr. Angelena Form in 2 months.  Mixed hyperlipidemia recent lipid panel done at [facility] urgent care reviewed with patient.  Will start Zetia 10 mg once daily.  Hopefully he will tolerate this.  Heart healthy diet.  Avoid fast foods and processed foods.  History of mitral valve prolapse most recent echo in 2016 did not reveal this.  Do not hear on exam.      Medication Adjustments/Labs and Tests Ordered: Current medicines are reviewed at length with the patient today.  Concerns regarding medicines are outlined above.  Medication changes, Labs and Tests ordered today are listed in the Patient Instructions below. There are no Patient Instructions on file for this visit.   Sumner Boast, PA-C  03/26/2017 11:39 AM    [facility] [address], [place], [place]  [address] Phone: [phone]; Fax: [phone]

## 2017-03-27 ENCOUNTER — Encounter: Payer: Self-pay | Admitting: Family Medicine

## 2017-03-27 ENCOUNTER — Ambulatory Visit: Payer: PPO | Admitting: Family Medicine

## 2017-03-27 VITALS — BP 120/70 | HR 90 | Temp 97.7°F | Resp 18 | Ht 70.0 in | Wt 141.6 lb

## 2017-03-27 DIAGNOSIS — R002 Palpitations: Secondary | ICD-10-CM | POA: Diagnosis not present

## 2017-03-27 DIAGNOSIS — E785 Hyperlipidemia, unspecified: Secondary | ICD-10-CM

## 2017-03-27 DIAGNOSIS — Z125 Encounter for screening for malignant neoplasm of prostate: Secondary | ICD-10-CM | POA: Diagnosis not present

## 2017-03-27 NOTE — Patient Instructions (Addendum)
IF you received an x-ray today, you will receive an invoice from [facility]. Please contact [facility] at [phone] with questions or concerns regarding your invoice.   IF you received labwork today, you will receive an invoice from [facility]. Please contact [name] at [phone] with questions or concerns regarding your invoice.   Our billing staff will not be able to assist you with questions regarding bills from these companies.  You will be contacted with the lab results as soon as they are available. The fastest way to get your results is to activate your [name] account. Instructions are located on the last page of this paperwork. If you have not heard from [place] regarding the results in 2 weeks, please contact this office.     Ezetimibe Tablets What is this medicine? EZETIMIBE (ez ET i mibe) blocks the absorption of cholesterol from the stomach. It can help lower blood cholesterol for patients who are at risk of getting heart disease or a stroke. It is only for patients whose cholesterol level is not controlled by diet. This medicine may be used for other purposes; ask your health care provider or pharmacist if you have questions. COMMON BRAND NAME(S): Zetia What should I tell my health care provider before I take this medicine? They need to know if you have any of these conditions: -liver disease -an unusual or allergic reaction to ezetimibe, medicines, foods, dyes, or preservatives -pregnant or trying to get pregnant -breast-feeding How should I use this medicine? Take this medicine by mouth with a glass of water. Follow the directions on the prescription label. This medicine can be taken with or without food. Take your doses at regular intervals. Do not take your medicine more often than directed. Talk to your pediatrician regarding the use of this medicine in children. Special care may be needed. Overdosage: If you think you have taken too much of  this medicine contact a poison control center or emergency room at once. NOTE: This medicine is only for you. Do not share this medicine with others. What if I miss a dose? If you miss a dose, take it as soon as you can. If it is almost time for your next dose, take only that dose. Do not take double or extra doses. What may interact with this medicine? Do not take this medicine with any of the following medications: -fenofibrate -gemfibrozil This medicine may also interact with the following medications: -antacids -cyclosporine -herbal medicines like red yeast rice -other medicines to lower cholesterol or triglycerides This list may not describe all possible interactions. Give your health care provider a list of all the medicines, herbs, non-prescription drugs, or dietary supplements you use. Also tell them if you smoke, drink alcohol, or use illegal drugs. Some items may interact with your medicine. What should I watch for while using this medicine? Visit your doctor or health care professional for regular checks on your progress. You will need to have your cholesterol levels checked. If you are also taking some other cholesterol medicines, you will also need to have tests to make sure your liver is working properly. Tell your doctor or health care professional if you get any unexplained muscle pain, tenderness, or weakness, especially if you also have a fever and tiredness. You need to follow a low-cholesterol, low-fat diet while you are taking this medicine. This will decrease your risk of getting heart and blood vessel disease. Exercising and avoiding alcohol and smoking can also help. Ask your  doctor or dietician for advice. What side effects may I notice from receiving this medicine? Side effects that you should report to your doctor or health care professional as soon as possible: -allergic reactions like skin rash, itching or hives, swelling of the face, lips, or tongue -dark yellow or  brown urine -unusually weak or tired -yellowing of the skin or eyes Side effects that usually do not require medical attention (report to your doctor or health care professional if they continue or are bothersome): -diarrhea -dizziness -headache -stomach upset or pain This list may not describe all possible side effects. Call your doctor for medical advice about side effects. You may report side effects to FDA at 1-800-FDA-1088. Where should I keep my medicine? Keep out of the reach of children. Store at room temperature between 15 and 30 degrees C (59 and 86 degrees F). Protect from moisture. Keep container tightly closed. Throw away any unused medicine after the expiration date. NOTE: This sheet is a summary. It may not cover all possible information. If you have questions about this medicine, talk to your doctor, pharmacist, or health care provider.  2018 Elsevier/Gold Standard (2011-10-22 15:39:09) Fat and Cholesterol Restricted Diet High levels of fat and cholesterol in your blood may lead to various health problems, such as diseases of the heart, blood vessels, gallbladder, liver, and pancreas. Fats are concentrated sources of energy that come in various forms. Certain types of fat, including saturated fat, may be harmful in excess. Cholesterol is a substance needed by your body in small amounts. Your body makes all the cholesterol it needs. Excess cholesterol comes from the food you eat. When you have high levels of cholesterol and saturated fat in your blood, health problems can develop because the excess fat and cholesterol will gather along the walls of your blood vessels, causing them to narrow. Choosing the right foods will help you control your intake of fat and cholesterol. This will help keep the levels of these substances in your blood within normal limits and reduce your risk of disease. What is my plan? Your health care provider recommends that you:  Limit your fat intake to  ______% or less of your total calories per day.  Limit the amount of cholesterol in your diet to less than _________mg per day.  Eat 20-30 grams of fiber each day.  What types of fat should I choose?  Choose healthy fats more often. Choose monounsaturated and polyunsaturated fats, such as olive and canola oil, flaxseeds, walnuts, almonds, and seeds.  Eat more omega-3 fats. Good choices include salmon, mackerel, sardines, tuna, flaxseed oil, and ground flaxseeds. Aim to eat fish at least two times a week.  Limit saturated fats. Saturated fats are primarily found in animal products, such as meats, butter, and cream. Plant sources of saturated fats include palm oil, palm kernel oil, and coconut oil.  Avoid foods with partially hydrogenated oils in them. These contain trans fats. Examples of foods that contain trans fats are stick margarine, some tub margarines, cookies, crackers, and other baked goods. What general guidelines do I need to follow? These guidelines for healthy eating will help you control your intake of fat and cholesterol:  Check food labels carefully to identify foods with trans fats or high amounts of saturated fat.  Fill one half of your plate with vegetables and green salads.  Fill one fourth of your plate with whole grains. Look for the word "whole" as the first word in the ingredient list.  Fill  one fourth of your plate with lean protein foods.  Limit fruit to two servings a day. Choose fruit instead of juice.  Eat more foods that contain fiber, such as apples, broccoli, carrots, beans, peas, and barley.  Eat more home-cooked food and less restaurant, buffet, and fast food.  Limit or avoid alcohol.  Limit foods high in starch and sugar.  Limit fried foods.  Cook foods using methods other than frying. Baking, boiling, grilling, and broiling are all great options.  Lose weight if you are overweight. Losing just 5-10% of your initial body weight can help your  overall health and prevent diseases such as diabetes and heart disease.  What foods can I eat? Grains  Whole grains, such as whole wheat or whole grain breads, crackers, cereals, and pasta. Unsweetened oatmeal, bulgur, barley, quinoa, or brown rice. Corn or whole wheat flour tortillas. Vegetables  Fresh or frozen vegetables (raw, steamed, roasted, or grilled). Green salads. Fruits  All fresh, canned (in natural juice), or frozen fruits. Meats and other protein foods  Ground beef (85% or leaner), grass-fed beef, or beef trimmed of fat. Skinless chicken or [place]. Ground chicken or [place]. Pork trimmed of fat. All fish and seafood. Eggs. Dried beans, peas, or lentils. Unsalted nuts or seeds. Unsalted canned or dry beans. Dairy  Low-fat dairy products, such as skim or 1% milk, 2% or reduced-fat cheeses, low-fat ricotta or cottage cheese, or plain low-fat yo Fats and oils  Tub margarines without trans fats. Light or reduced-fat mayonnaise and salad dressings. Avocado. Olive, canola, sesame, or safflower oils. Natural peanut or almond butter (choose ones without added sugar and oil). The items listed above may not be a complete list of recommended foods or beverages. Contact your dietitian for more options. Foods to avoid Grains  White bread. White pasta. White rice. Cornbread. Bagels, pastries, and croissants. Crackers that contain trans fat. Vegetables  White potatoes. Corn. Creamed or fried vegetables. Vegetables in a cheese sauce. Fruits  Dried fruits. Canned fruit in light or heavy syrup. Fruit juice. Meats and other protein foods  Fatty cuts of meat. Ribs, chicken wings, bacon, sausage, bologna, salami, chitterlings, fatback, hot dogs, bratwurst, and packaged luncheon meats. Liver and organ meats. Dairy  Whole or 2% milk, cream, half-and-half, and cream cheese. Whole milk cheeses. Whole-fat or sweetened yogurt. Full-fat cheeses. Nondairy creamers and whipped toppings. Processed  cheese, cheese spreads, or cheese curds. Beverages  Alcohol. Sweetened drinks (such as sodas, lemonade, and fruit drinks or punches). Fats and oils  Butter, stick margarine, lard, shortening, ghee, or bacon fat. Coconut, palm kernel, or palm oils. Sweets and desserts  Corn syrup, sugars, honey, and molasses. Candy. Jam and jelly. Syrup. Sweetened cereals. Cookies, pies, cakes, donuts, muffins, and ice cream. The items listed above may not be a complete list of foods and beverages to avoid. Contact your dietitian for more information. This information is not intended to replace advice given to you by your health care provider. Make sure you discuss any questions you have with your health care provider. Document Released: 04/16/2005 Document Revised: 05/07/2014 Document Reviewed: 07/15/2013 Elsevier Interactive Patient Education  2017 [employer].

## 2017-03-27 NOTE — Progress Notes (Signed)
Chief Complaint  Patient presents with  . medication consult    was given new meds for palpitations and have questions before taking it  . PSA    wants blood work for PSA    HPI  Pt reports that he saw Cardiology and was advised to take Zetia by Cardiology He states that he was also prescribed Diltiazem for palpitations He states that he does not know why he was not started on Zetia before He reports that he wanted to understand more about his risk for heart attack and stroke.   The 10-year ASCVD risk score William [name] DC Brooke Bonito., et al., 2013) is: 6.7%   Values used to calculate the score:     Age: 56 years     Sex: Male     Is Non-Hispanic African American: Yes     Diabetic: No     Tobacco smoker: No     Systolic Blood Pressure: 371 mmHg     Is BP treated: No     HDL Cholesterol: 52 mg/dL     Total Cholesterol: 233 mg/dL  He says his wife's insurance needs an updated PSA   Past Medical History:  Diagnosis Date  . Allergy   . Anxiety   . Depression   . Gastroparesis   . Heart murmur   . Migraine headache   . Mitral valve prolapse     Current Outpatient Medications  Medication Sig Dispense Refill  . butalbital-acetaminophen-caffeine (FIORICET, ESGIC) 50-325-40 MG tablet Take 1-2 tablets by mouth every 4 (four) hours as needed for headache. 40 tablet 0  . fluticasone (FLONASE) 50 MCG/ACT nasal spray Place 2 sprays daily into both nostrils. 16 g 6  . Lifitegrast (XIIDRA) 5 % SOLN Apply 1 drop 2 (two) times daily to eye.    . ondansetron (ZOFRAN) 8 MG tablet Take 1 tablet (8 mg total) by mouth every 8 (eight) hours as needed for nausea or vomiting. 20 tablet 0  . traMADol (ULTRAM) 50 MG tablet Take 1 tablet (50 mg total) by mouth every 8 (eight) hours as needed. 25 tablet 2  . Vitamin D, Ergocalciferol, (DRISDOL) 50000 units CAPS capsule Take 1 capsule (50,000 Units total) by mouth every 7 (seven) days. 24 capsule 0  . diltiazem (CARDIZEM) 30 MG tablet Take 1 tablet (30 mg  total) by mouth as needed. TAKE FOR PALPITATIONS (Patient not taking: Reported on 03/27/2017) 30 tablet 11  . ezetimibe (ZETIA) 10 MG tablet Take 1 tablet (10 mg total) by mouth daily. (Patient not taking: Reported on 03/27/2017) 90 tablet 3   No current facility-administered medications for this visit.     Allergies:  Allergies  Allergen Reactions  . Erythromycin Nausea And Vomiting and Anaphylaxis  . Hydrocodone-Acetaminophen Nausea And Vomiting  . Tramadol Nausea And Vomiting  . Cephalexin Diarrhea and Nausea And Vomiting  . Cephalosporins Diarrhea and Nausea And Vomiting  . Compazine [Prochlorperazine Edisylate]     uncontrollable shake - tremor  . Ketoconazole Itching  . Ketorolac Nausea And Vomiting  . Ketorolac Tromethamine     REACTION: Reaction not known  . Latex Itching    REACTION: Reaction not known  . Nalbuphine Other (See Comments)    States loss of consciousness REACTION: Reaction not known  . Oxycodone-Acetaminophen Other (See Comments) and Swelling  . Prednisone Nausea And Vomiting  . Prochlorperazine Edisylate     REACTION: shakes/tremors  . Restasis [Cyclosporine] Other (See Comments)    migraines  . Topamax [Topiramate] Diarrhea and Nausea  And Vomiting  . Cefdinir Diarrhea, Nausea And Vomiting and Rash  . Neosporin [Neomycin-Bacitracin Zn-Polymyx] Rash  . Oxycodone-Acetaminophen Other (See Comments)    No past surgical history on file.  Social History   Socioeconomic History  . Marital status: Married    Spouse name: None  . Number of children: None  . Years of education: None  . Highest education level: None  Social Needs  . Financial resource strain: None  . Food insecurity - worry: None  . Food insecurity - inability: None  . Transportation needs - medical: None  . Transportation needs - non-medical: None  Occupational History  . None  Tobacco Use  . Smoking status: Never Smoker  . Smokeless tobacco: Never Used  Substance and Sexual  Activity  . Alcohol use: No    Alcohol/week: 0.0 oz  . Drug use: No  . Sexual activity: Yes    Partners: Female  Other Topics Concern  . None  Social History Narrative  . None    Family History  Problem Relation Age of Onset  . Hypertension Mother   . Hypertension Brother      ROS Review of Systems See HPI Constitution: No fevers or chills No malaise No diaphoresis Skin: No rash or itching Eyes: no blurry vision, no double vision GU: no dysuria or hematuria Neuro: no dizziness or headaches  all others reviewed and negative   Objective: Vitals:   03/27/17 1130  BP: 120/70  Pulse: 90  Resp: 18  Temp: 97.7 F (36.5 C)  SpO2: 98%  Weight: 141 lb 9.6 oz (64.2 kg)  Height: 5\' 10"  (1.778 m)    Physical Exam  Constitutional: He is oriented to person, place, and time. He appears well-developed and well-nourished.  HENT:  Head: Normocephalic and atraumatic.  Eyes: Conjunctivae and EOM are normal.  Cardiovascular: Normal rate, regular rhythm and normal heart sounds.  No murmur heard. Pulmonary/Chest: Effort normal and breath sounds normal. No stridor. No respiratory distress.  Neurological: He is alert and oriented to person, place, and time.  Psychiatric: He has a normal mood and affect. His behavior is normal. Judgment and thought content normal.     Assessment and Plan Quill was seen today for medication consult and psa.  Diagnoses and all orders for this visit:  Dyslipidemia- discussed his ascvd risk score of <10% Discussed risk and benefits of zetia Pt is highly allergic to meds He will continue lifestyle modification  Heart palpitations- continue diltiazem for palpitations and follow up with cardiology  Screening for PSA- Pt requesting psa screening  He already had a DRE by Urology      [place]

## 2017-03-28 ENCOUNTER — Telehealth: Payer: Self-pay | Admitting: Cardiovascular Disease

## 2017-03-28 LAB — PSA: PROSTATE SPECIFIC AG, SERUM: 2.2 ng/mL (ref 0.0–4.0)

## 2017-03-28 NOTE — Telephone Encounter (Signed)
I spoke with pt. He is asking about flying, driving and physical therapy while wearing event monitor. PT involves using hot towels and electrical stimulation to his back.  I told pt OK to drive while wearing monitor.  I told him he would need to take monitor off while flying and during PT.   Pt reports high sensitivity to medications and is concerned about possibly having problems with Zetia.  I told him if he has problems with this medication he should stop it and let our office know.

## 2017-03-28 NOTE — Telephone Encounter (Signed)
New message   Pt verbalized that he want Dr.McAlhany to call because he has some questions that he want to ask him

## 2017-03-29 ENCOUNTER — Telehealth: Payer: Self-pay

## 2017-03-29 NOTE — Telephone Encounter (Signed)
Received community resource referral from Dr. Brigitte Pulse. Contacted patient and provided requested vocational rehab resources as well as my contact information for any further questions.    Josepha Pigg, B.A.  Care Guide - [facility] [place]

## 2017-04-05 ENCOUNTER — Ambulatory Visit (INDEPENDENT_AMBULATORY_CARE_PROVIDER_SITE_OTHER): Payer: PPO

## 2017-04-05 DIAGNOSIS — R002 Palpitations: Secondary | ICD-10-CM

## 2017-04-22 ENCOUNTER — Other Ambulatory Visit: Payer: Self-pay

## 2017-04-22 ENCOUNTER — Ambulatory Visit: Payer: PPO | Admitting: Family Medicine

## 2017-04-22 ENCOUNTER — Encounter: Payer: Self-pay | Admitting: Family Medicine

## 2017-04-22 VITALS — BP 104/68 | HR 114 | Temp 98.8°F | Resp 16 | Ht 71.85 in | Wt 138.0 lb

## 2017-04-22 DIAGNOSIS — R0981 Nasal congestion: Secondary | ICD-10-CM

## 2017-04-22 NOTE — Progress Notes (Signed)
Chief Complaint  Patient presents with  . Sinus Problem    pt states he has been having sinus pressure and drainage since Friday.     HPI  Pt reports that he has been having sinus pressure and drainage since Friday which was 3 days ago Over the weekend he had nausea and vomited He took zofran which did not help He took the flexeril for muscle spasms He also took tramadol for headache pain He states that he continues to have sinus pressure He took some medication for constipation and had some relief He continues to have some nasal drainage and sinus headache   Past Medical History:  Diagnosis Date  . Allergy   . Anxiety   . Depression   . Gastroparesis   . Heart murmur   . Migraine headache   . Mitral valve prolapse     Current Outpatient Medications  Medication Sig Dispense Refill  . butalbital-acetaminophen-caffeine (FIORICET, ESGIC) 50-325-40 MG tablet Take 1-2 tablets by mouth every 4 (four) hours as needed for headache. 40 tablet 0  . clonazePAM (KLONOPIN) 2 MG tablet Take by mouth.    . diltiazem (CARDIZEM) 30 MG tablet Take 1 tablet (30 mg total) by mouth as needed. TAKE FOR PALPITATIONS 30 tablet 11  . ezetimibe (ZETIA) 10 MG tablet Take 1 tablet (10 mg total) by mouth daily. 90 tablet 3  . fluticasone (FLONASE) 50 MCG/ACT nasal spray Place 2 sprays daily into both nostrils. 16 g 6  . ibuprofen (ADVIL,MOTRIN) 200 MG tablet Take by mouth.    [name] Lifitegrast (XIIDRA) 5 % SOLN Apply 1 drop 2 (two) times daily to eye.    . metoCLOPramide (REGLAN) 5 MG tablet Take by mouth.    . mirtazapine (REMERON) 45 MG tablet Take by mouth.    . ondansetron (ZOFRAN) 4 MG tablet Take by mouth.    . ondansetron (ZOFRAN-ODT) 8 MG disintegrating tablet Take by mouth.    . promethazine (PHENERGAN) 25 MG tablet Take by mouth.    . traMADol (ULTRAM) 50 MG tablet Take 1 tablet (50 mg total) by mouth every 8 (eight) hours as needed. 25 tablet 2  . Vitamin D, Ergocalciferol, (DRISDOL) 50000  units CAPS capsule Take 1 capsule (50,000 Units total) by mouth every 7 (seven) days. 24 capsule 0   No current facility-administered medications for this visit.     Allergies:  Allergies  Allergen Reactions  . Erythromycin Nausea And Vomiting and Anaphylaxis  . Hydrocodone-Acetaminophen Nausea And Vomiting  . Tramadol Nausea And Vomiting  . Cephalexin Diarrhea and Nausea And Vomiting  . Cephalosporins Diarrhea and Nausea And Vomiting  . Compazine [Prochlorperazine Edisylate]     uncontrollable shake - tremor  . Ketoconazole Itching  . Ketorolac Nausea And Vomiting  . Ketorolac Tromethamine     REACTION: Reaction not known  . Latex Itching    REACTION: Reaction not known  . Nalbuphine Other (See Comments)    States loss of consciousness REACTION: Reaction not known  . Oxycodone-Acetaminophen Other (See Comments) and Swelling  . Prednisone Nausea And Vomiting  . Prochlorperazine Edisylate     REACTION: shakes/tremors  . Restasis [Cyclosporine] Other (See Comments)    migraines  . Topamax [Topiramate] Diarrhea and Nausea And Vomiting  . Cefdinir Diarrhea, Nausea And Vomiting and Rash  . Neosporin [Neomycin-Bacitracin Zn-Polymyx] Rash  . Oxycodone-Acetaminophen Other (See Comments)    History reviewed. No pertinent surgical history.  Social History   Socioeconomic History  . Marital status: Married  Spouse name: None  . Number of children: None  . Years of education: None  . Highest education level: None  Social Needs  . Financial resource strain: None  . Food insecurity - worry: None  . Food insecurity - inability: None  . Transportation needs - medical: None  . Transportation needs - non-medical: None  Occupational History  . None  Tobacco Use  . Smoking status: Never Smoker  . Smokeless tobacco: Never Used  Substance and Sexual Activity  . Alcohol use: No    Alcohol/week: 0.0 oz  . Drug use: No  . Sexual activity: Yes    Partners: Female  Other Topics  Concern  . None  Social History Narrative  . None    Family History  Problem Relation Age of Onset  . Hypertension Mother   . Hypertension Brother      ROS Review of Systems See HPI Constitution: No fevers or chills No malaise No diaphoresis Skin: No rash or itching Eyes: no blurry vision, no double vision GU: no dysuria or hematuria Neuro: no dizziness or headaches  all others reviewed and negative   Objective: Vitals:   04/22/17 1218  BP: 104/68  Pulse: (!) 114  Resp: 16  Temp: 98.8 F (37.1 C)  TempSrc: Oral  SpO2: 99%  Weight: 138 lb (62.6 kg)  Height: 5' 11.85" (1.825 m)    Physical Exam General: alert, oriented, in NAD Head: normocephalic, atraumatic, no sinus tenderness Eyes: EOM intact, no scleral icterus or conjunctival injection Ears: TM clear bilaterally Nose: mucosa nonerythematous, nonedematous Throat: no pharyngeal exudate or erythema Lymph: no posterior auricular, submental or cervical lymph adenopathy Heart: normal rate, normal sinus rhythm, no murmurs Lungs: clear to auscultation bilaterally, no wheezing   Assessment and Plan Tandy was seen today for sinus problem.  Diagnoses and all orders for this visit:  Sinus congestion     Karilynn Carranza A Nolon Rod

## 2017-04-22 NOTE — Patient Instructions (Addendum)
     IF you received an x-ray today, you will receive an invoice from [facility]. Please contact [facility] at [phone] with questions or concerns regarding your invoice.   IF you received labwork today, you will receive an invoice from [place]. Please contact [name] at [phone] with questions or concerns regarding your invoice.   Our billing staff will not be able to assist you with questions regarding bills from these companies.  You will be contacted with the lab results as soon as they are available. The fastest way to get your results is to activate your [name] account. Instructions are located on the last page of this paperwork. If you have not heard from [place] regarding the results in 2 weeks, please contact this office.     Sinus Rinse What is a sinus rinse? A sinus rinse is a simple home treatment that is used to rinse your sinuses with a sterile mixture of salt and water (saline solution). Sinuses are air-filled spaces in your skull behind the bones of your face and forehead that open into your nasal cavity. You will use the following:  Saline solution.  Neti pot or spray bottle. This releases the saline solution into your nose and through your sinuses. Neti pots and spray bottles can be purchased at [employer], a health food store, or online.  When would I do a sinus rinse? A sinus rinse can help to clear mucus, dirt, dust, or pollen from the nasal cavity. You may do a sinus rinse when you have a cold, a virus, nasal allergy symptoms, a sinus infection, or stuffiness in the nose or sinuses. If you are considering a sinus rinse:  Ask your child's health care provider before performing a sinus rinse on your child.  Do not do a sinus rinse if you have had ear or nasal surgery, ear infection, or blocked ears.  How do I do a sinus rinse?  Wash your hands.  Disinfect your device according to the directions provided and then dry it.  Use  the solution that comes with your device or one that is sold separately in stores. Follow the mixing directions on the package.  Fill your device with the amount of saline solution as directed by the device instructions.  Stand over a sink and tilt your head sideways over the sink.  Place the spout of the device in your upper nostril (the one closer to the ceiling).  Gently pour or squeeze the saline solution into the nasal cavity. The liquid should drain to the lower nostril if you are not overly congested.  Gently blow your nose. Blowing too hard may cause ear pain.  Repeat in the other nostril.  Clean and rinse your device with clean water and then air-dry it. Are there risks of a sinus rinse? Sinus rinse is generally very safe and effective. However, there are a few risks, which include:  A burning sensation in the sinuses. This may happen if you do not make the saline solution as directed. Make sure to follow all directions when making the saline solution.  Infection from contaminated water. This is rare, but possible.  Nasal irritation.  This information is not intended to replace advice given to you by your health care provider. Make sure you discuss any questions you have with your health care provider. Document Released: 11/11/2013 Document Revised: 03/13/2016 Document Reviewed: 09/01/2013 Elsevier Interactive Patient Education  2017 [employer].

## 2017-04-25 ENCOUNTER — Telehealth: Payer: Self-pay | Admitting: Cardiovascular Disease

## 2017-04-25 NOTE — Telephone Encounter (Signed)
Received monitor recording.  Pt had 40sec run of SVT with rate of 180BPM this morning at 10:15A.  Spoke with pt and he states he was at a car dealership getting repairs.  Pt felt heart racing and sat down, drank water and it resolved.  Pt didn't have the cell phone on him to trigger monitor but it did auto trigger.  Pt also didn't have his PRN diltiazem on him.  Pt states he knows when he goes in and out of these rhythms.  Denies SOB but states he does fill some pressure or fullness (pt indecisive about exact feeling).  Had pt hit the button on monitor to do f/u tracing.  Spoke with [employer] and they will go ahead and load f/u monitor.  They did say pt was back in NSR with a rate of 90BPM.  Spoke with Dr. Caryl Comes, DOD and he said have pt continue to monitor and use Diltiazem when appropriate.  Spoke with pt and made him aware of recommendations.  Advised pt to always keep monitor cell phone and Diltiazem on him.  Pt verbalized understanding and was in agreement with this plan.  Will route to Dr. Angelena Form for review and advisement. Pt currently scheduled to see Dr. Angelena Form 06/03/17.

## 2017-04-26 ENCOUNTER — Ambulatory Visit: Payer: PPO | Admitting: Emergency Medicine

## 2017-04-26 ENCOUNTER — Encounter: Payer: Self-pay | Admitting: Emergency Medicine

## 2017-04-26 ENCOUNTER — Other Ambulatory Visit: Payer: Self-pay

## 2017-04-26 VITALS — BP 106/64 | HR 106 | Temp 97.9°F | Resp 16 | Wt 137.6 lb

## 2017-04-26 DIAGNOSIS — G43809 Other migraine, not intractable, without status migrainosus: Secondary | ICD-10-CM | POA: Diagnosis not present

## 2017-04-26 DIAGNOSIS — R112 Nausea with vomiting, unspecified: Secondary | ICD-10-CM

## 2017-04-26 DIAGNOSIS — J3489 Other specified disorders of nose and nasal sinuses: Secondary | ICD-10-CM

## 2017-04-26 DIAGNOSIS — R002 Palpitations: Secondary | ICD-10-CM | POA: Diagnosis not present

## 2017-04-26 MED ORDER — ONDANSETRON HCL 4 MG PO TABS: 4.0000 mg | ORAL_TABLET | Freq: Three times a day (TID) | ORAL | 0 refills | Status: DC | PRN

## 2017-04-26 MED ORDER — BUTALBITAL-ASPIRIN-CAFFEINE 50-325-40 MG PO CAPS: 1.0000 | ORAL_CAPSULE | Freq: Two times a day (BID) | ORAL | 0 refills | Status: DC | PRN

## 2017-04-26 MED ORDER — TRIAMCINOLONE ACETONIDE 55 MCG/ACT NA AERO: 2.0000 | INHALATION_SPRAY | Freq: Every day | NASAL | 12 refills | Status: DC

## 2017-04-26 NOTE — Telephone Encounter (Signed)
Agree. Thanks, Chris 

## 2017-04-26 NOTE — Progress Notes (Signed)
William [name] 56 y.o.   Chief Complaint  Patient presents with  . Migraine    per patient since 04/22/17 Christmas Eve with nausea and vomiting    HISTORY OF PRESENT ILLNESS: This is a 56 y.o. male with h/o migraine headaches for years (on disability for these) c/o right sided typical bad headache with nausea and vomiting that started with sinus pressure 4 days ago. Also c/o intermittent heart palpitations; wearing monitor and under cardiologist's care; recently HR 180 (detected as SVT, lasted 40 seconds); he's been advised to take Cardizem when this happens. No new symptomatology.  HPI   Prior to Admission medications   Medication Sig Start Date End Date Taking? Authorizing Provider  butalbital-acetaminophen-caffeine (FIORICET, ESGIC) [phone] MG tablet Take 1-2 tablets by mouth every 4 (four) hours as needed for headache. 09/29/16 09/29/17 Yes Shawnee Knapp, MD  diltiazem (CARDIZEM) 30 MG tablet Take 1 tablet (30 mg total) by mouth as needed. TAKE FOR PALPITATIONS 03/26/17  Yes Imogene Burn, PA-C  fluticasone [facility]) 50 MCG/ACT nasal spray Place 2 sprays daily into both nostrils. 03/18/17  Yes Delia Chimes A, MD  ibuprofen (ADVIL,MOTRIN) 200 MG tablet Take by mouth.   Yes [provider]  ondansetron (ZOFRAN) 4 MG tablet Take by mouth.   Yes [provider]  promethazine (PHENERGAN) 25 MG tablet Take by mouth.   Yes [provider]  traMADol (ULTRAM) 50 MG tablet Take 1 tablet (50 mg total) by mouth every 8 (eight) hours as needed. 12/15/16  Yes Shawnee Knapp, MD  clonazePAM [facility]) 2 MG tablet Take by mouth.    [provider]  ezetimibe (ZETIA) 10 MG tablet Take 1 tablet (10 mg total) by mouth daily. Patient not taking: Reported on 04/26/2017 03/26/17 06/24/17  Imogene Burn, PA-C  Lifitegrast [facility]) 5 % SOLN Apply 1 drop 2 (two) times daily to eye. Patient not taking: Reported on 04/26/2017 03/06/17   Shawnee Knapp, MD  metoCLOPramide  (REGLAN) 5 MG tablet Take by mouth.    [provider]  mirtazapine (REMERON) 45 MG tablet Take by mouth.    [provider]  ondansetron (ZOFRAN-ODT) 8 MG disintegrating tablet Take by mouth.    [provider]  Vitamin D, Ergocalciferol, (DRISDOL) 50000 units CAPS capsule Take 1 capsule (50,000 Units total) by mouth every 7 (seven) days. Patient not taking: Reported on 04/26/2017 05/05/16   Shawnee Knapp, MD    Allergies  Allergen Reactions  . Erythromycin Nausea And Vomiting and Anaphylaxis  . Hydrocodone-Acetaminophen Nausea And Vomiting  . Tramadol Nausea And Vomiting  . Cephalexin Diarrhea and Nausea And Vomiting  . Cephalosporins Diarrhea and Nausea And Vomiting  . Compazine [Prochlorperazine Edisylate]     uncontrollable shake - tremor  . Ketoconazole Itching  . Ketorolac Nausea And Vomiting  . Ketorolac Tromethamine     REACTION: Reaction not known  . Latex Itching    REACTION: Reaction not known  . Nalbuphine Other (See Comments)    States loss of consciousness REACTION: Reaction not known  . Oxycodone-Acetaminophen Other (See Comments) and Swelling  . Prednisone Nausea And Vomiting  . Prochlorperazine Edisylate     REACTION: shakes/tremors  . Restasis [Cyclosporine] Other (See Comments)    migraines  . Topamax [Topiramate] Diarrhea and Nausea And Vomiting  . Cefdinir Diarrhea, Nausea And Vomiting and Rash  . Neosporin [Neomycin-Bacitracin Zn-Polymyx] Rash  . Oxycodone-Acetaminophen Other (See Comments)    Patient Active Problem List   Diagnosis  Date Noted  . Mild left inguinal hernia 11/23/2016  . Pelvic pain in male 09/28/2016  . Abdominal pain, left lower quadrant 09/28/2016  . Isolated proteinuria without specific morphologic lesion 06/27/2016  . Left testicular pain 06/27/2016  . Dry skin dermatitis 06/27/2016  . Acute left flank pain 06/27/2016  . Pelvic floor dysfunction 04/01/2016  . GERD (gastroesophageal reflux disease)  10/13/2015  . Gastroparesis 10/13/2015  . Intractable chronic migraine without aura and without status migrainosus 12/15/2013  . Hyperlipidemia 04/04/2012  . Mitral valve disease 07/15/2009  . IRRITABLE BOWEL SYNDROME 07/15/2009  . Palpitations 06/28/2009    Past Medical History:  Diagnosis Date  . Allergy   . Anxiety   . Depression   . Gastroparesis   . Heart murmur   . Migraine headache   . Mitral valve prolapse     No past surgical history on file.  Social History   Socioeconomic History  . Marital status: Married    Spouse name: Not on file  . Number of children: Not on file  . Years of education: Not on file  . Highest education level: Not on file  Social Needs  . Financial resource strain: Not on file  . Food insecurity - worry: Not on file  . Food insecurity - inability: Not on file  . Transportation needs - medical: Not on file  . Transportation needs - non-medical: Not on file  Occupational History  . Not on file  Tobacco Use  . Smoking status: Never Smoker  . Smokeless tobacco: Never Used  Substance and Sexual Activity  . Alcohol use: No    Alcohol/week: 0.0 oz  . Drug use: No  . Sexual activity: Yes    Partners: Female  Other Topics Concern  . Not on file  Social History Narrative  . Not on file    Family History  Problem Relation Age of Onset  . Hypertension Mother   . Hypertension Brother      Review of Systems  Constitutional: Negative for chills and fever.  HENT: Positive for congestion and sinus pain. Negative for sore throat.        Rhinorrhea  Eyes: Negative for blurred vision and double vision.  Respiratory: Negative for cough and shortness of breath.   Cardiovascular: Positive for palpitations. Negative for chest pain and leg swelling.  Gastrointestinal: Positive for nausea and vomiting. Negative for abdominal pain and diarrhea.  Genitourinary: Negative for dysuria and hematuria.  Skin: Negative for rash.  Neurological:  Positive for headaches. Negative for dizziness, focal weakness and loss of consciousness.  Endo/Heme/Allergies: Negative.   All other systems reviewed and are negative.     Vitals:   04/25/17 0922  BP: 106/64  Pulse: (!) 106  Resp: 16  Temp: 97.9 F (36.6 C)  SpO2: 100%    Physical Exam  Constitutional: He is oriented to person, place, and time. He appears well-developed and well-nourished.  HENT:  Head: Normocephalic and atraumatic.  Right Ear: External ear normal.  Left Ear: External ear normal.  Nose: Nose normal.  Mouth/Throat: Oropharynx is clear and moist.  Eyes: Conjunctivae and EOM are normal. Pupils are equal, round, and reactive to light.  Neck: Normal range of motion. Neck supple. No JVD present. No thyromegaly present.  Cardiovascular: Normal rate, regular rhythm and normal heart sounds.  Pulmonary/Chest: Effort normal and breath sounds normal.  Abdominal: Soft. Bowel sounds are normal. He exhibits no distension. There is no tenderness.  Musculoskeletal: Normal range of motion. He  exhibits no edema or tenderness.  Lymphadenopathy:    He has no cervical adenopathy.  Neurological: He is alert and oriented to person, place, and time. He displays normal reflexes. No sensory deficit. He exhibits normal muscle tone. Coordination normal.  Skin: Skin is warm and dry. Capillary refill takes less than 2 seconds. No rash noted.  Psychiatric: He has a normal mood and affect. His behavior is normal.  Vitals reviewed.  NSR with VR94 No acute ischemic changes PR152 QRS84 compared with 11/27  no changes.   ASSESSMENT & PLAN: Dinh was seen today for migraine.  Diagnoses and all orders for this visit:  Palpitations -     EKG 12-Lead  Other migraine without status migrainosus, not intractable -     butalbital-aspirin-caffeine (FIORINAL) 50-325-40 MG capsule; Take 1 capsule by mouth 2 (two) times daily as needed for headache.  Nausea and vomiting, intractability of  vomiting not specified, unspecified vomiting type -     ondansetron (ZOFRAN) 4 MG tablet; Take 1 tablet (4 mg total) by mouth every 8 (eight) hours as needed for nausea or vomiting.  Sinus pressure -     triamcinolone (NASACORT) 55 MCG/ACT AERO nasal inhaler; Place 2 sprays into the nose daily.    Patient Instructions       IF you received an x-ray today, you will receive an invoice from [facility]. Please contact [facility] at [phone] with questions or concerns regarding your invoice.   IF you received labwork today, you will receive an invoice from [place]. Please contact [name] at [phone] with questions or concerns regarding your invoice.   Our billing staff will not be able to assist you with questions regarding bills from these companies.  You will be contacted with the lab results as soon as they are available. The fastest way to get your results is to activate your [name] account. Instructions are located on the last page of this paperwork. If you have not heard from [place] regarding the results in 2 weeks, please contact this office.     Migraine Headache A migraine headache is a very strong throbbing pain on one side or both sides of your head. Migraines can also cause other symptoms. Talk with your doctor about what things may bring on (trigger) your migraine headaches. Follow these instructions at home: Medicines  Take over-the-counter and prescription medicines only as told by your doctor.  Do not drive or use heavy machinery while taking prescription pain medicine.  To prevent or treat constipation while you are taking prescription pain medicine, your doctor may recommend that you: ? Drink enough fluid to keep your pee (urine) clear or pale yellow. ? Take over-the-counter or prescription medicines. ? Eat foods that are high in fiber. These include fresh fruits and vegetables, whole grains, and beans. ? Limit foods that are high in fat  and processed sugars. These include fried and sweet foods. Lifestyle  Avoid alcohol.  Do not use any products that contain nicotine or tobacco, such as cigarettes and e-cigarettes. If you need help quitting, ask your doctor.  Get at least 8 hours of sleep every night.  Limit your stress. General instructions   Keep a journal to find out what may bring on your migraines. For example, write down: ? What you eat and drink. ? How much sleep you get. ? Any change in what you eat or drink. ? Any change in your medicines.  If you have a migraine: ? Avoid things that make  your symptoms worse, such as bright lights. ? It may help to lie down in a dark, quiet room. ? Do not drive or use heavy machinery. ? Ask your doctor what activities are safe for you.  Keep all follow-up visits as told by your doctor. This is important. Contact a doctor if:  You get a migraine that is different or worse than your usual migraines. Get help right away if:  Your migraine gets very bad.  You have a fever.  You have a stiff neck.  You have trouble seeing.  Your muscles feel weak or like you cannot control them.  You start to lose your balance a lot.  You start to have trouble walking.  You pass out (faint). This information is not intended to replace advice given to you by your health care provider. Make sure you discuss any questions you have with your health care provider. Document Released: 01/24/2008 Document Revised: 11/04/2015 Document Reviewed: 10/03/2015 Elsevier Interactive Patient Education  2018 [employer].  Palpitations A palpitation is the feeling that your heart:  Has an uneven (irregular) heartbeat.  Is beating faster than normal.  Is fluttering.  Is skipping a beat.  This is usually not a serious problem. In some cases, you may need more medical tests. Follow these instructions at home:  Avoid: ? Caffeine in coffee, tea, soft drinks, diet pills, and energy  drinks. ? Chocolate. ? Alcohol.  Do not use any tobacco products. These include cigarettes, chewing tobacco, and e-cigarettes. If you need help quitting, ask your doctor.  Try to reduce your stress. These things may help: ? Yoga. ? Meditation. ? Physical activity. Swimming, jogging, and walking are good choices. ? A method that helps you use your mind to control things in your body, like heartbeats (biofeedback).  Get plenty of rest and sleep.  Take over-the-counter and prescription medicines only as told by your doctor.  Keep all follow-up visits as told by your doctor. This is important. Contact a doctor if:  Your heartbeat is still fast or uneven after 24 hours.  Your palpitations occur more often. Get help right away if:  You have chest pain.  You feel short of breath.  You have a very bad headache.  You feel dizzy.  You pass out (faint). This information is not intended to replace advice given to you by your health care provider. Make sure you discuss any questions you have with your health care provider. Document Released: 01/24/2008 Document Revised: 09/22/2015 Document Reviewed: 12/30/2014 Elsevier Interactive Patient Education  2018 [employer].      Agustina Caroli, MD [facility]

## 2017-04-26 NOTE — Patient Instructions (Addendum)
IF you received an x-ray today, you will receive an invoice from [facility]. Please contact [facility] at [phone] with questions or concerns regarding your invoice.   IF you received labwork today, you will receive an invoice from [place]. Please contact [name] at [phone] with questions or concerns regarding your invoice.   Our billing staff will not be able to assist you with questions regarding bills from these companies.  You will be contacted with the lab results as soon as they are available. The fastest way to get your results is to activate your [name] account. Instructions are located on the last page of this paperwork. If you have not heard from [place] regarding the results in 2 weeks, please contact this office.     Migraine Headache A migraine headache is a very strong throbbing pain on one side or both sides of your head. Migraines can also cause other symptoms. Talk with your doctor about what things may bring on (trigger) your migraine headaches. Follow these instructions at home: Medicines  Take over-the-counter and prescription medicines only as told by your doctor.  Do not drive or use heavy machinery while taking prescription pain medicine.  To prevent or treat constipation while you are taking prescription pain medicine, your doctor may recommend that you: ? Drink enough fluid to keep your pee (urine) clear or pale yellow. ? Take over-the-counter or prescription medicines. ? Eat foods that are high in fiber. These include fresh fruits and vegetables, whole grains, and beans. ? Limit foods that are high in fat and processed sugars. These include fried and sweet foods. Lifestyle  Avoid alcohol.  Do not use any products that contain nicotine or tobacco, such as cigarettes and e-cigarettes. If you need help quitting, ask your doctor.  Get at least 8 hours of sleep every night.  Limit your stress. General instructions   Keep a  journal to find out what may bring on your migraines. For example, write down: ? What you eat and drink. ? How much sleep you get. ? Any change in what you eat or drink. ? Any change in your medicines.  If you have a migraine: ? Avoid things that make your symptoms worse, such as bright lights. ? It may help to lie down in a dark, quiet room. ? Do not drive or use heavy machinery. ? Ask your doctor what activities are safe for you.  Keep all follow-up visits as told by your doctor. This is important. Contact a doctor if:  You get a migraine that is different or worse than your usual migraines. Get help right away if:  Your migraine gets very bad.  You have a fever.  You have a stiff neck.  You have trouble seeing.  Your muscles feel weak or like you cannot control them.  You start to lose your balance a lot.  You start to have trouble walking.  You pass out (faint). This information is not intended to replace advice given to you by your health care provider. Make sure you discuss any questions you have with your health care provider. Document Released: 01/24/2008 Document Revised: 11/04/2015 Document Reviewed: 10/03/2015 Elsevier Interactive Patient Education  2018 [employer].  Palpitations A palpitation is the feeling that your heart:  Has an uneven (irregular) heartbeat.  Is beating faster than normal.  Is fluttering.  Is skipping a beat.  This is usually not a serious problem. In some cases, you may need more medical tests.  Follow these instructions at home:  Avoid: ? Caffeine in coffee, tea, soft drinks, diet pills, and energy drinks. ? Chocolate. ? Alcohol.  Do not use any tobacco products. These include cigarettes, chewing tobacco, and e-cigarettes. If you need help quitting, ask your doctor.  Try to reduce your stress. These things may help: ? Yoga. ? Meditation. ? Physical activity. Swimming, jogging, and walking are good choices. ? A method  that helps you use your mind to control things in your body, like heartbeats (biofeedback).  Get plenty of rest and sleep.  Take over-the-counter and prescription medicines only as told by your doctor.  Keep all follow-up visits as told by your doctor. This is important. Contact a doctor if:  Your heartbeat is still fast or uneven after 24 hours.  Your palpitations occur more often. Get help right away if:  You have chest pain.  You feel short of breath.  You have a very bad headache.  You feel dizzy.  You pass out (faint). This information is not intended to replace advice given to you by your health care provider. Make sure you discuss any questions you have with your health care provider. Document Released: 01/24/2008 Document Revised: 09/22/2015 Document Reviewed: 12/30/2014 Elsevier Interactive Patient Education  [employer].

## 2017-04-26 NOTE — Telephone Encounter (Signed)
Called patient about Dr. Camillia Herter response. Patient verbalized understanding.

## 2017-04-29 ENCOUNTER — Other Ambulatory Visit: Payer: Self-pay | Admitting: Emergency Medicine

## 2017-04-29 NOTE — Progress Notes (Unsigned)
fioricet

## 2017-05-03 ENCOUNTER — Telehealth: Payer: Self-pay | Admitting: Cardiovascular Disease

## 2017-05-03 NOTE — Telephone Encounter (Signed)
New message      Pt c/o medication issue:  1. Name of Medication: ezetimibe (ZETIA) 10 MG tablet Take 1 tablet (10 mg total) by mouth daily. Patient not taking: Reported on 04/26/2017     2. How are you currently taking this medication (dosage and times per day)? Once a day , patient started taking it around christmas   3. Are you having a reaction (difficulty breathing--STAT)? Yes   4. What is your medication issue? Diarrhea

## 2017-05-03 NOTE — Telephone Encounter (Signed)
I spoke with William [name]. He started Zetia around 12/28 and quickly developed diarrhea. History of IBS. He stopped Zetia and diarrhea improved. I told him to stay off Zetia and  I would cancel follow up lipid and liver profiles scheduled for 05/07/17.  He will discuss other possible cholesterol medications with Dr. Angelena Form at office visit on 06/03/17

## 2017-05-07 ENCOUNTER — Other Ambulatory Visit: Payer: PPO

## 2017-05-08 ENCOUNTER — Telehealth: Payer: Self-pay | Admitting: *Deleted

## 2017-05-08 NOTE — Telephone Encounter (Signed)
-----   Message from Burnell Blanks, MD sent at 05/08/2017 11:14 AM EST ----- One episode of SVT that last for less than a minute. Otherwise sinus. Continue to use Cardizem prn for palpitations. This is not a dangerous arhythmia. We can review vagal procedures with him to use as well. Thanks, chris

## 2017-05-08 NOTE — Telephone Encounter (Signed)
Pt notified. Vagal procedures reviewed with pt.

## 2017-05-28 ENCOUNTER — Ambulatory Visit (INDEPENDENT_AMBULATORY_CARE_PROVIDER_SITE_OTHER): Payer: PPO | Admitting: Family Medicine

## 2017-05-28 ENCOUNTER — Ambulatory Visit: Payer: PPO | Admitting: Family Medicine

## 2017-05-28 ENCOUNTER — Encounter: Payer: Self-pay | Admitting: Family Medicine

## 2017-05-28 VITALS — BP 120/70 | HR 85 | Temp 97.5°F | Resp 18 | Ht 71.0 in | Wt 141.0 lb

## 2017-05-28 DIAGNOSIS — E559 Vitamin D deficiency, unspecified: Secondary | ICD-10-CM | POA: Insufficient documentation

## 2017-05-28 DIAGNOSIS — R800 Isolated proteinuria: Secondary | ICD-10-CM

## 2017-05-28 DIAGNOSIS — Z5181 Encounter for therapeutic drug level monitoring: Secondary | ICD-10-CM | POA: Diagnosis not present

## 2017-05-28 DIAGNOSIS — E782 Mixed hyperlipidemia: Secondary | ICD-10-CM | POA: Diagnosis not present

## 2017-05-28 DIAGNOSIS — G8929 Other chronic pain: Secondary | ICD-10-CM | POA: Diagnosis not present

## 2017-05-28 DIAGNOSIS — I471 Supraventricular tachycardia: Secondary | ICD-10-CM | POA: Diagnosis not present

## 2017-05-28 DIAGNOSIS — R102 Pelvic and perineal pain: Secondary | ICD-10-CM | POA: Diagnosis not present

## 2017-05-28 DIAGNOSIS — Z1211 Encounter for screening for malignant neoplasm of colon: Secondary | ICD-10-CM | POA: Diagnosis not present

## 2017-05-28 DIAGNOSIS — N5082 Scrotal pain: Secondary | ICD-10-CM | POA: Diagnosis not present

## 2017-05-28 DIAGNOSIS — G43719 Chronic migraine without aura, intractable, without status migrainosus: Secondary | ICD-10-CM

## 2017-05-28 LAB — POC MICROSCOPIC URINALYSIS (UMFC): Mucus: ABSENT

## 2017-05-28 LAB — POCT URINALYSIS DIP (MANUAL ENTRY)
Bilirubin, UA: NEGATIVE
GLUCOSE UA: NEGATIVE mg/dL
Ketones, POC UA: NEGATIVE mg/dL
Leukocytes, UA: NEGATIVE
Nitrite, UA: NEGATIVE
SPEC GRAV UA: 1.01 (ref 1.010–1.025)
UROBILINOGEN UA: 0.2 U/dL
pH, UA: 6 (ref 5.0–8.0)

## 2017-05-28 MED ORDER — PROMETHAZINE HCL 12.5 MG PO TABS: 12.5000 mg | ORAL_TABLET | Freq: Three times a day (TID) | ORAL | 0 refills | Status: DC | PRN

## 2017-05-28 MED ORDER — PROPRANOLOL HCL 20 MG PO TABS: 20.0000 mg | ORAL_TABLET | Freq: Two times a day (BID) | ORAL | 0 refills | Status: DC

## 2017-05-28 NOTE — Progress Notes (Signed)
Subjective:  By signing my name below, I, William [name], attest that this documentation has been prepared under the direction and in the presence of William Cheadle, MD Electronically Signed: Ladene [name], ED Scribe 05/28/2017 at 10:48 AM.   Patient ID: William [name], male    DOB: [birth year], 57 y.o.   MRN: [id number]  Chief Complaint  Patient presents with  . chronic fatigue    follow up  . heart palpitations    seeing cardiology next week, wore a heart monitor december - jan per pt   HPI William [name] "William [name]" is a 57 y.o. male who presents to Primary Care at [facility] for f/u on chronic fatigue. Pt has OV with William [name] on 2/4. Started on Zetia but had diarrhea so taken off and will discuss other cholesterol meds with cardiologist at next visit. Also had an event monitor which he wore for some time, only detected 1 episode of SVT that lasted less than 1 minute. Advised to use Cardizem prn for palpitations and take vagal procedures to use during SVT. -- Pt has only taken 2 doses.  Nephrology Pt has an annual with his nephrologist William Lips, MD at [facility] on 2/14.  GI Pt's GI doctor advised that if abdominal pain has improved, he would hold off on colonoscopy. Recommended Cologuard for colon CA screening. He would only advise colonoscopy if pt was having abdominal pain.  Migraines Pt is preparing for a trip in April. States he has not flown since 1996 but is nervous that flying will exacerbate HAs. Has been treated migraines with cyclobenzaprine and fiorinal together which he states works well. Takes Zofran for nausea when he has migraines, however, he states that he still ends up vomiting.  Past Medical History:  Diagnosis Date  . Allergy   . Anxiety   . Depression   . Gastroparesis   . Heart murmur   . Migraine headache   . Mitral valve prolapse    Current Outpatient Medications on File Prior to Visit  Medication Sig Dispense Refill  .  butalbital-aspirin-caffeine (FIORINAL) 50-325-40 MG capsule Take 1 capsule by mouth 2 (two) times daily as needed for headache. 20 capsule 0  . clonazePAM (KLONOPIN) 2 MG tablet Take by mouth.    . diltiazem (CARDIZEM) 30 MG tablet Take 1 tablet (30 mg total) by mouth as needed. TAKE FOR PALPITATIONS 30 tablet 11  . fluticasone (FLONASE) 50 MCG/ACT nasal spray Place 2 sprays daily into both nostrils. 16 g 6  . ibuprofen (ADVIL,MOTRIN) 200 MG tablet Take by mouth.    . metoCLOPramide (REGLAN) 5 MG tablet Take by mouth.    . mirtazapine (REMERON) 45 MG tablet Take by mouth.    . ondansetron (ZOFRAN) 4 MG tablet Take by mouth.    . traMADol (ULTRAM) 50 MG tablet Take 1 tablet (50 mg total) by mouth every 8 (eight) hours as needed. 25 tablet 2  . triamcinolone (NASACORT) 55 MCG/ACT AERO nasal inhaler Place 2 sprays into the nose daily. 1 Inhaler 12  . Vitamin D, Ergocalciferol, (DRISDOL) 50000 units CAPS capsule Take 1 capsule (50,000 Units total) by mouth every 7 (seven) days. 24 capsule 0  . Lifitegrast (XIIDRA) 5 % SOLN Apply 1 drop 2 (two) times daily to eye. (Patient not taking: Reported on 04/26/2017)    . ondansetron (ZOFRAN) 4 MG tablet Take 1 tablet (4 mg total) by mouth every 8 (eight) hours as needed for nausea or vomiting. (Patient not taking: Reported on  05/28/2017) 20 tablet 0  . ondansetron (ZOFRAN-ODT) 8 MG disintegrating tablet Take by mouth.    . promethazine (PHENERGAN) 25 MG tablet Take by mouth.     No current facility-administered medications on file prior to visit.    Allergies  Allergen Reactions  . Erythromycin Nausea And Vomiting and Anaphylaxis  . Hydrocodone-Acetaminophen Nausea And Vomiting  . Tramadol Nausea And Vomiting  . Cephalexin Diarrhea and Nausea And Vomiting  . Cephalosporins Diarrhea and Nausea And Vomiting  . Compazine [Prochlorperazine Edisylate]     uncontrollable shake - tremor  . Ketoconazole Itching  . Ketorolac Nausea And Vomiting  . Ketorolac  Tromethamine     REACTION: Reaction not known  . Latex Itching    REACTION: Reaction not known  . Nalbuphine Other (See Comments)    States loss of consciousness REACTION: Reaction not known  . Oxycodone-Acetaminophen Other (See Comments) and Swelling  . Prednisone Nausea And Vomiting  . Prochlorperazine Edisylate     REACTION: shakes/tremors  . Restasis [Cyclosporine] Other (See Comments)    migraines  . Topamax [Topiramate] Diarrhea and Nausea And Vomiting  . Zetia [Ezetimibe] Diarrhea  . Cefdinir Diarrhea, Nausea And Vomiting and Rash  . Neosporin [Neomycin-Bacitracin Zn-Polymyx] Rash  . Oxycodone-Acetaminophen Other (See Comments)   No past surgical history on file. Family History  Problem Relation Age of Onset  . Hypertension Mother   . Hypertension Brother    Social History   Socioeconomic History  . Marital status: Married    Spouse name: None  . Number of children: None  . Years of education: None  . Highest education level: None  Social Needs  . Financial resource strain: None  . Food insecurity - worry: None  . Food insecurity - inability: None  . Transportation needs - medical: None  . Transportation needs - non-medical: None  Occupational History  . None  Tobacco Use  . Smoking status: Never Smoker  . Smokeless tobacco: Never Used  Substance and Sexual Activity  . Alcohol use: No    Alcohol/week: 0.0 oz  . Drug use: No  . Sexual activity: Yes    Partners: Female  Other Topics Concern  . None  Social History Narrative  . None   Depression screen [facility] 2/9 05/28/2017 04/26/2017 04/22/2017 03/27/2017 03/18/2017  Decreased Interest 0 0 0 0 0  Down, Depressed, Hopeless 0 0 0 0 0  PHQ - 2 Score 0 0 0 0 0  Altered sleeping - - - - -  Tired, decreased energy - - - - -  Change in appetite - - - - -  Feeling bad or failure about yourself  - - - - -  Trouble concentrating - - - - -  Moving slowly or fidgety/restless - - - - -  Suicidal thoughts - - - - -   PHQ-9 Score - - - - -  Some recent data might be hidden    Review of Systems  Cardiovascular: Positive for palpitations.  Neurological: Positive for headaches.      Objective:   Physical Exam  Constitutional: He is oriented to person, place, and time. He appears well-developed and well-nourished. No distress.  HENT:  Head: Normocephalic and atraumatic.  Eyes: Conjunctivae and EOM are normal.  Neck: Neck supple. No tracheal deviation present.  Cardiovascular: Normal rate, regular rhythm, S1 normal, S2 normal and normal heart sounds.  Pulmonary/Chest: Effort normal and breath sounds normal. No respiratory distress.  Musculoskeletal: Normal range of motion.  Neurological:  He is alert and oriented to person, place, and time.  Skin: Skin is warm and dry.  Psychiatric: He has a normal mood and affect. His behavior is normal.  Nursing note and vitals reviewed.  BP 120/70   Pulse 85   Temp (!) 97.5 F (36.4 C) (Oral)   Resp 18   Ht 5\' 11"  (1.803 m)   Wt 141 lb (64 kg)   SpO2 100%   BMI 19.67 kg/m      Results for orders placed or performed in visit on 05/28/17  POCT urinalysis dipstick  Result Value Ref Range   Color, UA yellow yellow   Clarity, UA clear clear   Glucose, UA negative negative mg/dL   Bilirubin, UA negative negative   Ketones, POC UA negative negative mg/dL   Spec Grav, UA 1.010 1.010 - 1.025   Blood, UA trace-lysed (A) negative   pH, UA 6.0 5.0 - 8.0   Protein Ur, POC trace (A) negative mg/dL   Urobilinogen, UA 0.2 0.2 or 1.0 E.U./dL   Nitrite, UA Negative Negative   Leukocytes, UA Negative Negative  POCT Microscopic Urinalysis ([facility])  Result Value Ref Range   WBC,UR,HPF,POC None None WBC/hpf   RBC,UR,HPF,POC None None RBC/hpf   Bacteria None None, Too numerous to count   Mucus Absent Absent   Epithelial Cells, UR Per Microscopy None None, Too numerous to count cells/hpf   Assessment & Plan:  Fax all lab results to Dr. Hollie Salk at [facility].  Okay to refill cyclobenzaprine and fiorinal which he uses together for migraine HAs. 1. Isolated proteinuria without specific morphologic lesion   2. Chronic pelvic pain in male   3. Scrotal pain   4. Screening for colon cancer   5. Vitamin D deficiency   6. Intractable chronic migraine without aura and without status migrainosus   7. Mixed hyperlipidemia   8. Paroxysmal SVT (supraventricular tachycardia) (HCC)   9. Medication monitoring encounter     Orders Placed This Encounter  Procedures  . Protein / Creatinine Ratio, Urine  . Cologuard  . Comprehensive metabolic panel  . VITAMIN D 25 Hydroxy (Vit-D Deficiency, Fractures)  . Ambulatory referral to Physical Therapy    Referral Priority:   Routine    Referral Type:   Physical Medicine    Referral Reason:   Specialty Services Required    Requested Specialty:   Physical Therapy    Number of Visits Requested:   1  . POCT urinalysis dipstick  . POCT Microscopic Urinalysis ([facility])    Meds ordered this encounter  Medications  . promethazine (PHENERGAN) 12.5 MG tablet    Sig: Take 1 tablet (12.5 mg total) by mouth every 8 (eight) hours as needed (migraine headache).    Dispense:  20 tablet    Refill:  0  . propranolol (INDERAL) 20 MG tablet    Sig: Take 1 tablet (20 mg total) by mouth 2 (two) times daily. Take twice daily to prevent SVT and migraines    Dispense:  60 tablet    Refill:  0    I personally performed the services described in this documentation, which was scribed in my presence. The recorded information has been reviewed and considered, and addended by me as needed.   William [name], M.D.  Primary Care at [facility] [address] [place], [place] [address] [phone] phone [phone] fax  05/31/17 8:14 AM

## 2017-05-28 NOTE — Patient Instructions (Addendum)
Start propranolol twice a day to prevent migraines and palpitations. We might need to gradually increase the does over time until it is effective.    IF you received an x-ray today, you will receive an invoice from [facility]. Please contact [facility] at [phone] with questions or concerns regarding your invoice.   IF you received labwork today, you will receive an invoice from [facility]. Please contact [name] at [phone] with questions or concerns regarding your invoice.   Our billing staff will not be able to assist you with questions regarding bills from these companies.  You will be contacted with the lab results as soon as they are available. The fastest way to get your results is to activate your [name] account. Instructions are located on the last page of this paperwork. If you have not heard from [place] regarding the results in 2 weeks, please contact this office.

## 2017-05-29 LAB — COMPREHENSIVE METABOLIC PANEL
A/G RATIO: 1.6 (ref 1.2–2.2)
ALT: 13 IU/L (ref 0–44)
AST: 17 IU/L (ref 0–40)
Albumin: 4.5 g/dL (ref 3.5–5.5)
Alkaline Phosphatase: 82 IU/L (ref 39–117)
BUN/Creatinine Ratio: 11 (ref 9–20)
BUN: 10 mg/dL (ref 6–24)
Bilirubin Total: 0.3 mg/dL (ref 0.0–1.2)
CO2: 22 mmol/L (ref 20–29)
CREATININE: 0.89 mg/dL (ref 0.76–1.27)
Calcium: 9.5 mg/dL (ref 8.7–10.2)
Chloride: 98 mmol/L (ref 96–106)
GFR calc non Af Amer: 95 mL/min/{1.73_m2} (ref >59)
GFR, EST AFRICAN AMERICAN: 110 mL/min/{1.73_m2} (ref >59)
GLOBULIN, TOTAL: 2.8 g/dL (ref 1.5–4.5)
Glucose: 74 mg/dL (ref 65–99)
Potassium: 4.4 mmol/L (ref 3.5–5.2)
SODIUM: 140 mmol/L (ref 134–144)
TOTAL PROTEIN: 7.3 g/dL (ref 6.0–8.5)

## 2017-05-29 LAB — PROTEIN / CREATININE RATIO, URINE
Creatinine, Urine: 110.4 mg/dL
PROTEIN UR: 24 mg/dL
Protein/Creat Ratio: 217 mg/g creat — ABNORMAL HIGH (ref 0–200)

## 2017-05-29 LAB — VITAMIN D 25 HYDROXY (VIT D DEFICIENCY, FRACTURES): Vit D, 25-Hydroxy: 19.7 ng/mL — ABNORMAL LOW (ref 30.0–100.0)

## 2017-06-03 ENCOUNTER — Ambulatory Visit: Payer: PPO | Admitting: Cardiovascular Disease

## 2017-06-03 ENCOUNTER — Encounter: Payer: Self-pay | Admitting: Cardiovascular Disease

## 2017-06-03 VITALS — BP 116/68 | HR 97 | Ht 70.0 in | Wt 145.0 lb

## 2017-06-03 DIAGNOSIS — I471 Supraventricular tachycardia: Secondary | ICD-10-CM | POA: Diagnosis not present

## 2017-06-03 MED ORDER — DILTIAZEM HCL ER COATED BEADS 120 MG PO CP24: 120.0000 mg | ORAL_CAPSULE | Freq: Every day | ORAL | 6 refills | Status: DC

## 2017-06-03 NOTE — Progress Notes (Signed)
Chief Complaint  Patient presents with  . Follow-up    palpitations    History of Present Illness: 57 yo male with history of palpitations/PVCs, migraine headaches, mitral valve prolapse and irritable bowel syndrome who is here today for cardiac follow up. He has been known to have PVCs in the past. He did not tolerate beta blockers in the past. I started him on Cardizem CD 120 mg per day in 2011. He had a normal echo and stress test in 2011. Holter monitor in 2011 with no evidence of SVT, atrial fibrillation. On 07/03/12 he began to have a productive cough and chest pain and was seen in the [facility]. CTA chest negative for PE. He was seen later that day by Dr. Everlene Farrier in the [facility] and given c/o chest pain at that time, was sent to [facility] ED. EKG with sinus tach, rate 107 bpm. Troponin was negative in the ED. He described sweating, coughing, fever, chills and pleuritic chest pain with cough. He had noticed his heart racing. I arranged a 48 hour monitor and an echo. Echo with normal LV size and function. 48 hour monitor with normal sinus rhythm and rare PACs. He was seen November 2018 in our office by Estella Husk, PA and had c/o palpitations. He had been seen in primary care with a URI and was felt to be dehydrated. He was given Diltiazem to take as needed. Cardiac event monitor November 2018 with sinus rhythm and one run of SVT (40 beats).  He ws started on propranolol for his SVT by primary care but it caused diarrhea. He was started on Zetia for his HLD but had diarrhea.  He is here today for follow up. The patient denies any chest pain, dyspnea, lower extremity edema, orthopnea, PND, dizziness, near syncope or syncope. He has noted 4 episodes of palpitations over the past 2 months, two were while he was wearing his cardiac monitor. Each episode lasts for 30 seconds.   Primary Care Physician: Shawnee Knapp, MD  Past Medical History:  Diagnosis Date  . Allergy   . Anxiety    . Depression   . Gastroparesis   . Heart murmur   . Migraine headache   . Mitral valve prolapse    History reviewed. No pertinent surgical history.  Current Outpatient Medications  Medication Sig Dispense Refill  . butalbital-aspirin-caffeine (FIORINAL) 50-325-40 MG capsule Take 1 capsule by mouth 2 (two) times daily as needed for headache. 20 capsule 0  . clonazePAM (KLONOPIN) 2 MG tablet Take by mouth.    . diltiazem (CARDIZEM) 30 MG tablet Take 1 tablet (30 mg total) by mouth as needed. TAKE FOR PALPITATIONS 30 tablet 11  . fluticasone (FLONASE) 50 MCG/ACT nasal spray Place 2 sprays daily into both nostrils. 16 g 6  . ibuprofen (ADVIL,MOTRIN) 200 MG tablet Take by mouth.    [name] Lifitegrast (XIIDRA) 5 % SOLN Apply 1 drop 2 (two) times daily to eye.    . metoCLOPramide (REGLAN) 5 MG tablet Take by mouth.    . mirtazapine (REMERON) 45 MG tablet Take by mouth.    . ondansetron (ZOFRAN) 4 MG tablet Take by mouth.    . promethazine (PHENERGAN) 12.5 MG tablet Take 1 tablet (12.5 mg total) by mouth every 8 (eight) hours as needed (migraine headache). 20 tablet 0  . traMADol (ULTRAM) 50 MG tablet Take 1 tablet (50 mg total) by mouth every 8 (eight) hours as needed. 25 tablet  2  . triamcinolone (NASACORT) 55 MCG/ACT AERO nasal inhaler Place 2 sprays into the nose daily. 1 Inhaler 12  . Vitamin D, Ergocalciferol, (DRISDOL) 50000 units CAPS capsule Take 1 capsule (50,000 Units total) by mouth every 7 (seven) days. 24 capsule 0  . diltiazem (CARDIZEM CD) 120 MG 24 hr capsule Take 1 capsule (120 mg total) by mouth daily. 30 capsule 6   No current facility-administered medications for this visit.     Allergies  Allergen Reactions  . Erythromycin Nausea And Vomiting and Anaphylaxis  . Hydrocodone-Acetaminophen Nausea And Vomiting  . Cephalexin Diarrhea and Nausea And Vomiting  . Cephalosporins Diarrhea and Nausea And Vomiting  . Compazine [Prochlorperazine Edisylate]     uncontrollable shake -  tremor  . Ketoconazole Itching  . Ketorolac Nausea And Vomiting  . Latex Itching    REACTION: Reaction not known  . Nalbuphine Other (See Comments)    States loss of consciousness REACTION: Reaction not known  . Oxycodone-Acetaminophen Other (See Comments) and Swelling  . Prednisone Nausea And Vomiting  . Propranolol Diarrhea  . Restasis [Cyclosporine] Other (See Comments)    migraines  . Topamax [Topiramate] Diarrhea and Nausea And Vomiting  . Zetia [Ezetimibe] Diarrhea  . Cefdinir Diarrhea, Nausea And Vomiting and Rash  . Neosporin [Neomycin-Bacitracin Zn-Polymyx] Rash    Social History   Socioeconomic History  . Marital status: Married    Spouse name: Not on file  . Number of children: Not on file  . Years of education: Not on file  . Highest education level: Not on file  Social Needs  . Financial resource strain: Not on file  . Food insecurity - worry: Not on file  . Food insecurity - inability: Not on file  . Transportation needs - medical: Not on file  . Transportation needs - non-medical: Not on file  Occupational History  . Not on file  Tobacco Use  . Smoking status: Never Smoker  . Smokeless tobacco: Never Used  Substance and Sexual Activity  . Alcohol use: No    Alcohol/week: 0.0 oz  . Drug use: No  . Sexual activity: Yes    Partners: Female  Other Topics Concern  . Not on file  Social History Narrative  . Not on file    Family History  Problem Relation Age of Onset  . Hypertension Mother   . Hypertension Brother     Review of Systems:  As stated in the HPI and otherwise negative.   BP 116/68   Pulse 97   Ht 5\' 10"  (1.778 m)   Wt 145 lb (65.8 kg)   SpO2 98%   BMI 20.81 kg/m   Physical Examination:  General: Well developed, well nourished, NAD  HEENT: OP clear, mucus membranes moist  SKIN: warm, dry. No rashes. Neuro: No focal deficits  Musculoskeletal: Muscle strength 5/5 all ext  Psychiatric: Mood and affect normal  Neck: No JVD, no  carotid bruits, no thyromegaly, no lymphadenopathy.  Lungs:Clear bilaterally, no wheezes, rhonci, crackles Cardiovascular: Regular rate and rhythm. No murmurs, gallops or rubs. Abdomen:Soft. Bowel sounds present. Non-tender.  Extremities: No lower extremity edema. Pulses are 2 + in the bilateral DP/PT.  Echo 07/10/12: Left ventricle: The cavity size was normal. Wall thickness was normal. Systolic function was normal. The estimated ejection fraction was in the range of 60% to 65%. Wall motion was normal; there were no regional wall motion abnormalities. Left ventricular diastolic function parameters were normal.   EKG:  EKG is not  ordered today. The ekg ordered today demonstrates   Recent Labs: 09/28/2016: Platelets 281 12/29/2016: Hemoglobin 15.1; TSH 0.634 05/28/2017: ALT 13; BUN 10; Creatinine, Ser 0.89; Potassium 4.4; Sodium 140   Lipid Panel    Component Value Date/Time   CHOL 233 (H) 12/29/2016 1036   TRIG 157 (H) 12/29/2016 1036   HDL 52 12/29/2016 1036   CHOLHDL 4.5 12/29/2016 1036   CHOLHDL 3.7 05/14/2015 0958   VLDL 25 05/14/2015 0958   LDLCALC 150 (H) 12/29/2016 1036     Wt Readings from Last 3 Encounters:  06/03/17 145 lb (65.8 kg)  05/28/17 141 lb (64 kg)  04/25/17 137 lb 9.6 oz (62.4 kg)     Other studies Reviewed: Additional studies/ records that were reviewed today include: . Review of the above records demonstrates:   Assessment and Plan:   1. SVT: Event monitor showed SVT. Will start Cardizem CD 120 mg daily. He will use vagal maneuvers and short acting Diltiazem for break through episodes. If he has frequent recurrence, will need EP referral for ablation.   Current medicines are reviewed at length with the patient today.  The patient does not have concerns regarding medicines.  The following changes have been made:  Adding Cardizem CD  Labs/ tests ordered today include:  No orders of the defined types were placed in this encounter.    Disposition:    FU with me in 3 months   Signed, Lauree Chandler, MD 06/03/2017 2:46 PM    [place], [place], [place]  [address] Phone: [phone]; Fax: [phone]

## 2017-06-03 NOTE — Patient Instructions (Signed)
Medication Instructions:  Your physician has recommended you make the following change in your medication:  Start Cardizem CD 120 mg by mouth daily.    Labwork: none  Testing/Procedures: none  Follow-Up: Your physician recommends that you schedule a follow-up appointment in: 3 months.  Scheduled for May 10,2019 at 10:00    Any Other Special Instructions Will Be Listed Below (If Applicable).     If you need a refill on your cardiac medications before your next appointment, please call your pharmacy.

## 2017-06-04 IMAGING — CR DG FINGER THUMB 2+V*R*
3 series · 3 of 3 positions shown · non-contrast
Comparison: None.

CLINICAL DATA: Right thumb contusion

EXAM:
RIGHT THUMB 2+V

[lateral]
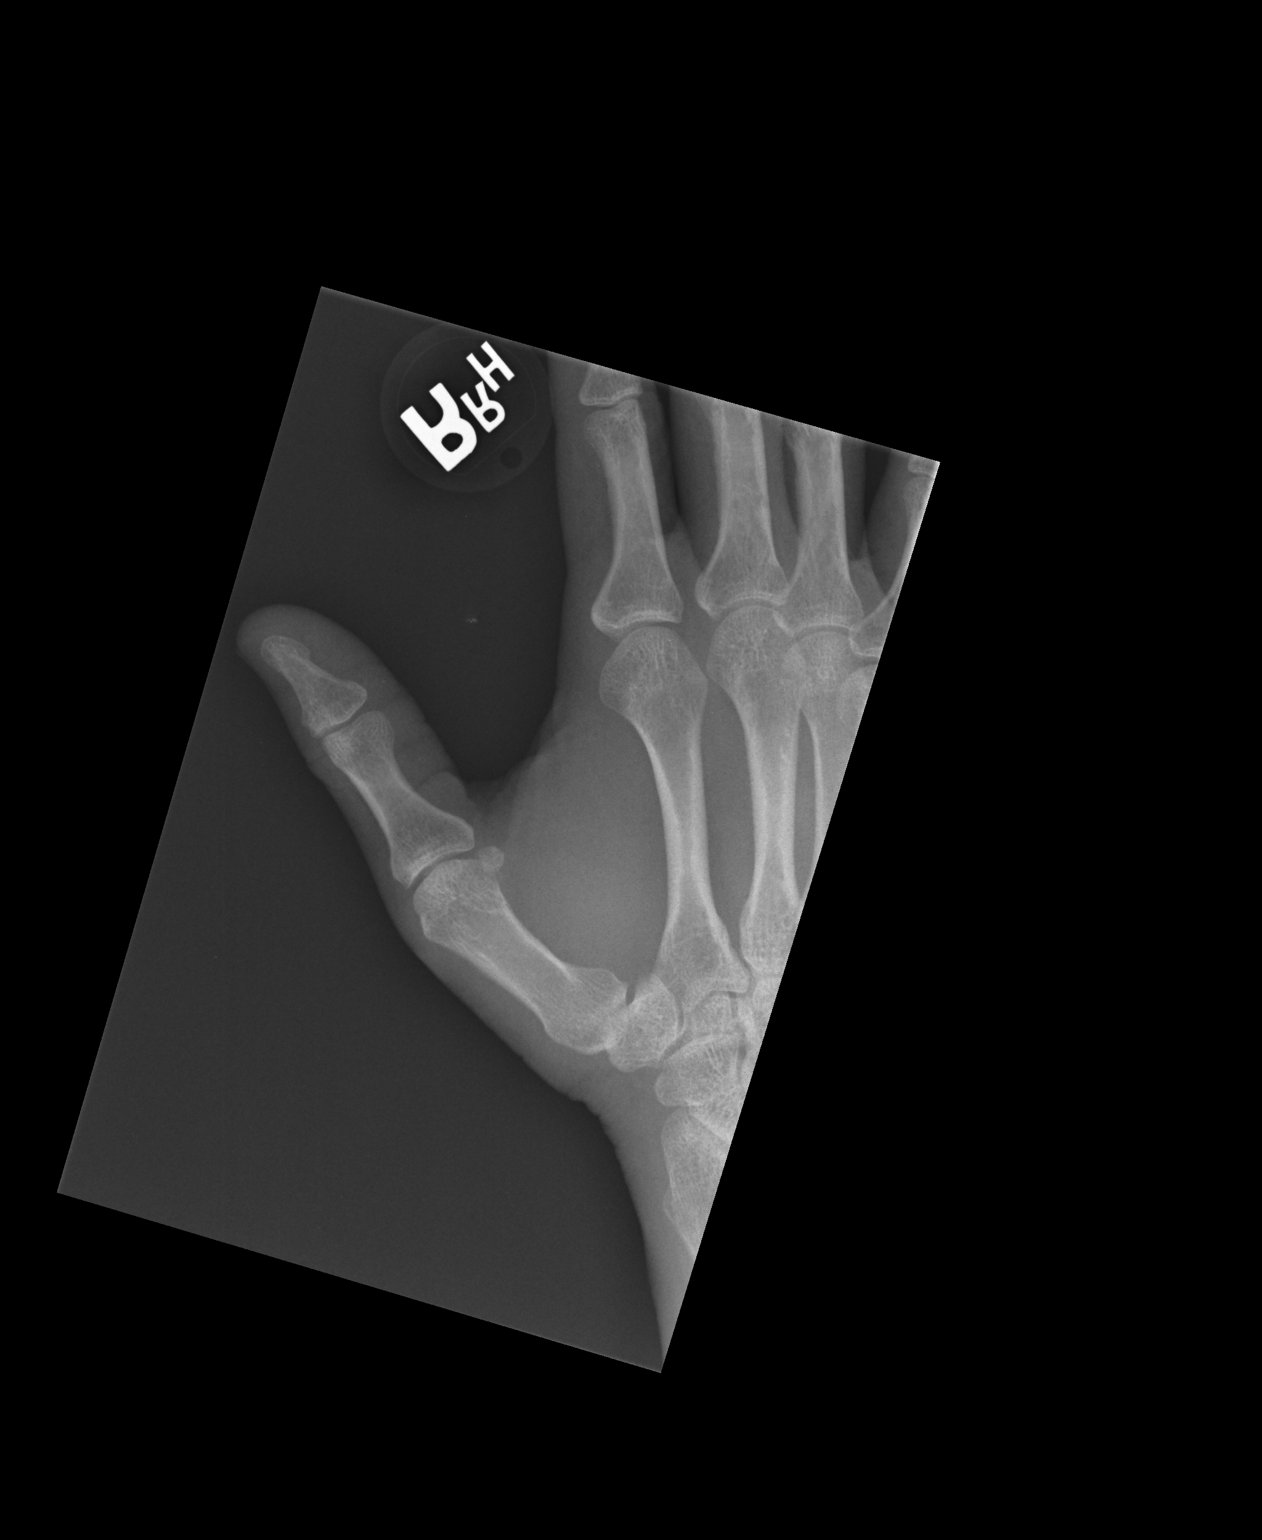

[PA]
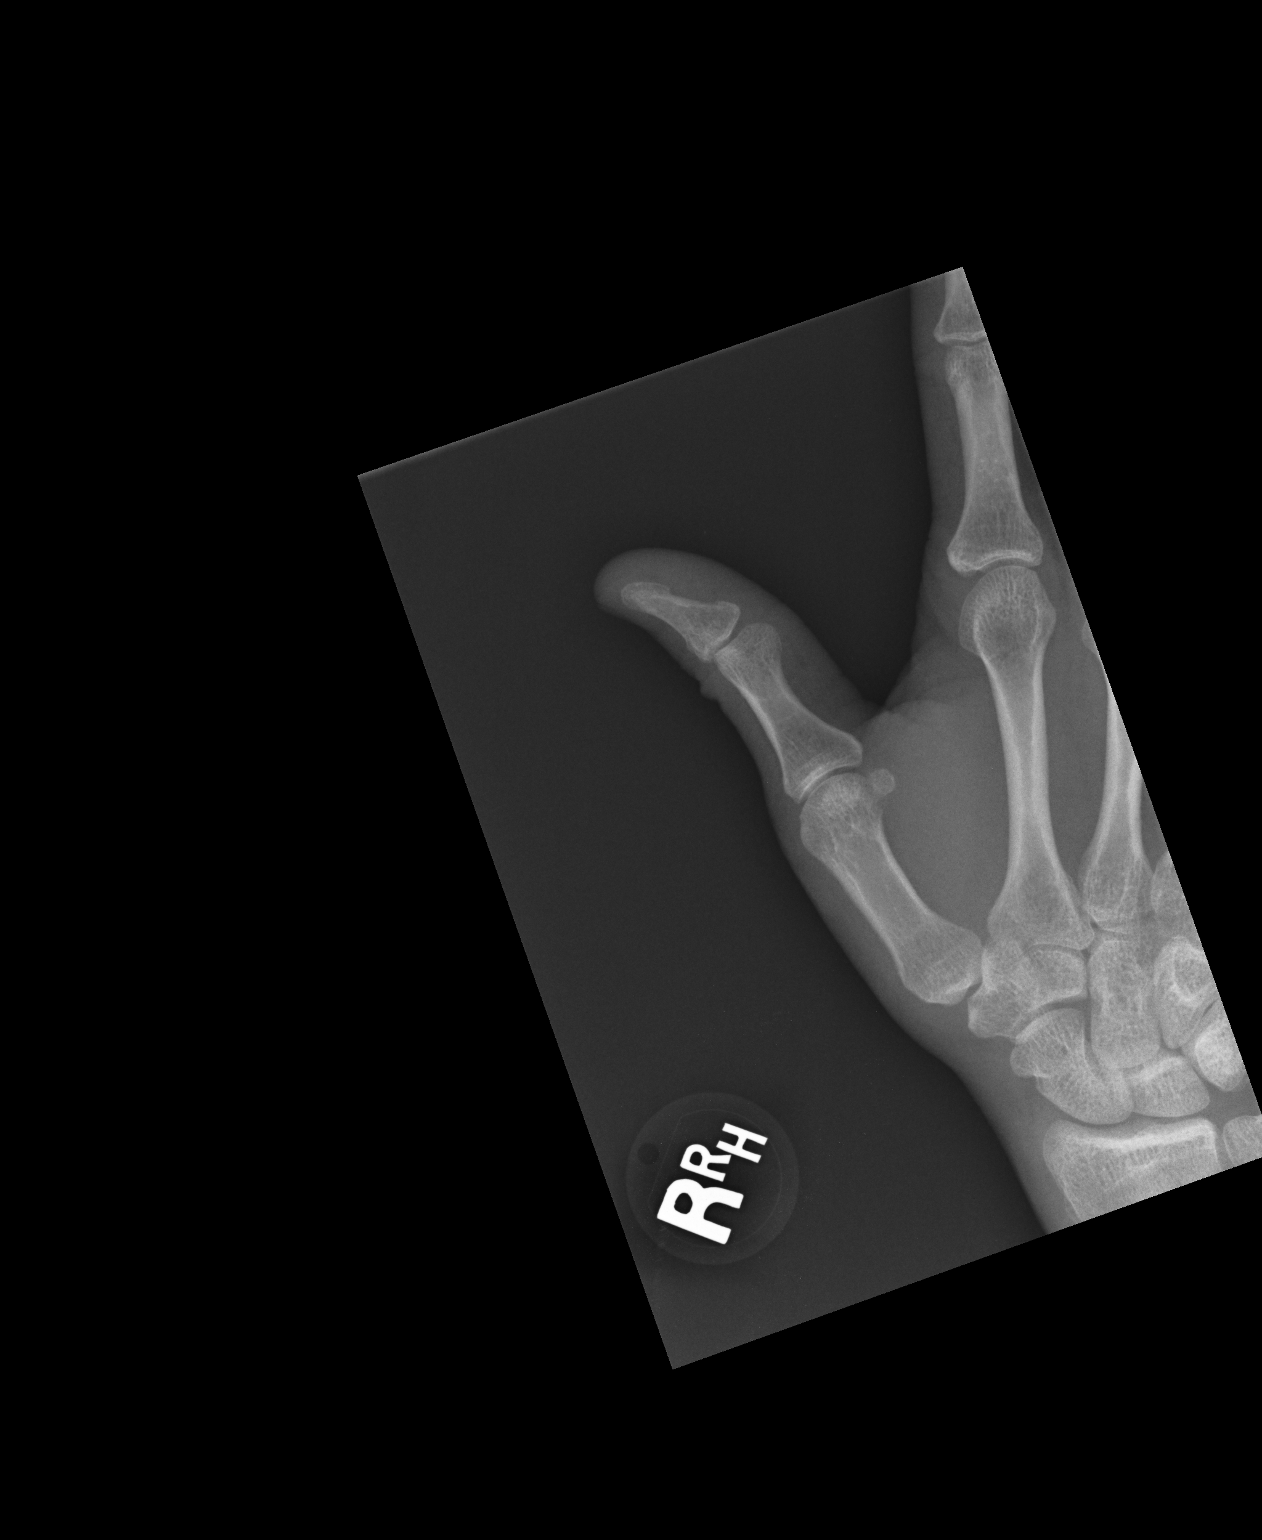

[oblique]
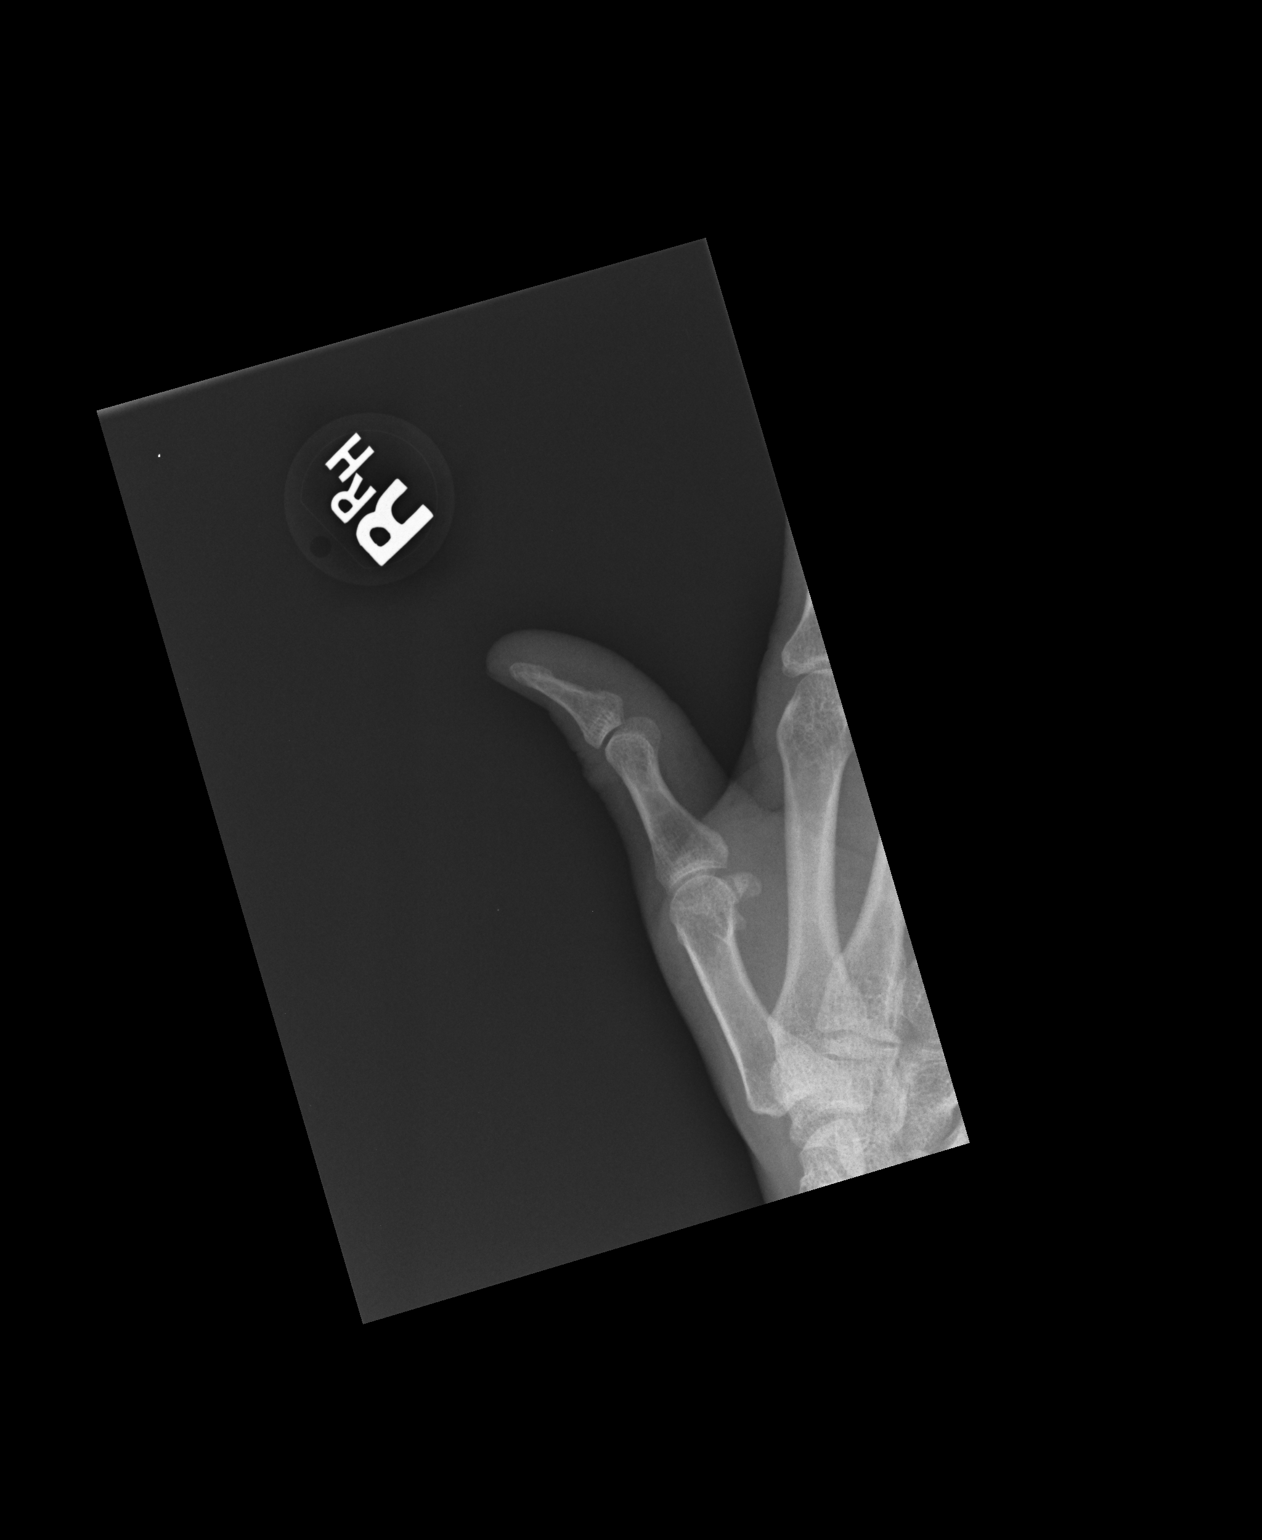

[3 of 3 positions shown; findings below may reference images not displayed]

FINDINGS: There is no evidence of fracture or dislocation. There is no
evidence of arthropathy or other focal bone abnormality. Soft
tissues are unremarkable
IMPRESSION: Negative.

## 2017-06-10 DIAGNOSIS — Z1211 Encounter for screening for malignant neoplasm of colon: Secondary | ICD-10-CM | POA: Diagnosis not present

## 2017-06-10 DIAGNOSIS — Z1212 Encounter for screening for malignant neoplasm of rectum: Secondary | ICD-10-CM | POA: Diagnosis not present

## 2017-06-13 DIAGNOSIS — N182 Chronic kidney disease, stage 2 (mild): Secondary | ICD-10-CM | POA: Diagnosis not present

## 2017-06-13 DIAGNOSIS — I471 Supraventricular tachycardia: Secondary | ICD-10-CM | POA: Diagnosis not present

## 2017-06-13 DIAGNOSIS — K589 Irritable bowel syndrome without diarrhea: Secondary | ICD-10-CM | POA: Diagnosis not present

## 2017-06-13 DIAGNOSIS — G43719 Chronic migraine without aura, intractable, without status migrainosus: Secondary | ICD-10-CM | POA: Diagnosis not present

## 2017-06-15 ENCOUNTER — Other Ambulatory Visit: Payer: Self-pay

## 2017-06-15 ENCOUNTER — Ambulatory Visit (INDEPENDENT_AMBULATORY_CARE_PROVIDER_SITE_OTHER): Payer: PPO | Admitting: Family Medicine

## 2017-06-15 ENCOUNTER — Encounter: Payer: Self-pay | Admitting: Family Medicine

## 2017-06-15 VITALS — BP 151/83 | HR 84 | Temp 97.7°F | Resp 17 | Ht 70.0 in | Wt 146.2 lb

## 2017-06-15 DIAGNOSIS — G8929 Other chronic pain: Secondary | ICD-10-CM | POA: Diagnosis not present

## 2017-06-15 DIAGNOSIS — G43809 Other migraine, not intractable, without status migrainosus: Secondary | ICD-10-CM

## 2017-06-15 DIAGNOSIS — R102 Pelvic and perineal pain: Secondary | ICD-10-CM

## 2017-06-15 DIAGNOSIS — J310 Chronic rhinitis: Secondary | ICD-10-CM

## 2017-06-15 MED ORDER — CYCLOBENZAPRINE HCL 10 MG PO TABS: 10.0000 mg | ORAL_TABLET | Freq: Three times a day (TID) | ORAL | 0 refills | Status: DC | PRN

## 2017-06-15 NOTE — Progress Notes (Signed)
Chief Complaint  Patient presents with  . URI    dry itchy eyes, rn, dry scratchy throat and cough, onset: 06/09/17.  Pt taking xiidra just started today    HPI  Onset 06/09/17 Pt reports that he is having sneezing, Dry itchy eyes, Runny nose, Dry scratchy throat and cough He states that he started [place] today for dry eyes He reports that he gets stressed and it sets off his headaches    Past Medical History:  Diagnosis Date  . Allergy   . Anxiety   . Depression   . Gastroparesis   . Heart murmur   . Migraine headache   . Mitral valve prolapse     Current Outpatient Medications  Medication Sig Dispense Refill  . butalbital-aspirin-caffeine (FIORINAL) 50-325-40 MG capsule Take 1 capsule by mouth 2 (two) times daily as needed for headache. 20 capsule 0  . diltiazem (CARDIZEM CD) 120 MG 24 hr capsule Take 1 capsule (120 mg total) by mouth daily. 30 capsule 6  . diltiazem (CARDIZEM) 30 MG tablet Take 1 tablet (30 mg total) by mouth as needed. TAKE FOR PALPITATIONS 30 tablet 11  . fluticasone (FLONASE) 50 MCG/ACT nasal spray Place 2 sprays daily into both nostrils. 16 g 6  . ibuprofen (ADVIL,MOTRIN) 200 MG tablet Take by mouth.    [name] Lifitegrast (XIIDRA) 5 % SOLN Apply 1 drop 2 (two) times daily to eye.    . ondansetron (ZOFRAN) 4 MG tablet Take by mouth.    . promethazine (PHENERGAN) 12.5 MG tablet Take 1 tablet (12.5 mg total) by mouth every 8 (eight) hours as needed (migraine headache). 20 tablet 0  . cyclobenzaprine (FLEXERIL) 10 MG tablet Take 1 tablet (10 mg total) by mouth 3 (three) times daily as needed for muscle spasms. 30 tablet 0  . metoCLOPramide (REGLAN) 5 MG tablet Take by mouth.    . mirtazapine (REMERON) 45 MG tablet Take by mouth.    . traMADol (ULTRAM) 50 MG tablet Take 1 tablet (50 mg total) by mouth every 8 (eight) hours as needed. 25 tablet 2  . triamcinolone (NASACORT) 55 MCG/ACT AERO nasal inhaler Place 2 sprays into the nose daily. 1 Inhaler 12  . Vitamin  D, Ergocalciferol, (DRISDOL) 50000 units CAPS capsule Take 1 capsule (50,000 Units total) by mouth every 7 (seven) days. 24 capsule 0   No current facility-administered medications for this visit.     Allergies:  Allergies  Allergen Reactions  . Erythromycin Nausea And Vomiting and Anaphylaxis  . Hydrocodone-Acetaminophen Nausea And Vomiting  . Cephalexin Diarrhea and Nausea And Vomiting  . Cephalosporins Diarrhea and Nausea And Vomiting  . Compazine [Prochlorperazine Edisylate]     uncontrollable shake - tremor  . Ketoconazole Itching  . Ketorolac Nausea And Vomiting  . Latex Itching    REACTION: Reaction not known  . Nalbuphine Other (See Comments)    States loss of consciousness REACTION: Reaction not known  . Oxycodone-Acetaminophen Other (See Comments) and Swelling  . Prednisone Nausea And Vomiting  . Propranolol Diarrhea  . Restasis [Cyclosporine] Other (See Comments)    migraines  . Topamax [Topiramate] Diarrhea and Nausea And Vomiting  . Zetia [Ezetimibe] Diarrhea  . Cefdinir Diarrhea, Nausea And Vomiting and Rash  . Neosporin [Neomycin-Bacitracin Zn-Polymyx] Rash    No past surgical history on file.  Social History   Socioeconomic History  . Marital status: Married    Spouse name: None  . Number of children: None  . Years of education: None  .  Highest education level: None  Social Needs  . Financial resource strain: None  . Food insecurity - worry: None  . Food insecurity - inability: None  . Transportation needs - medical: None  . Transportation needs - non-medical: None  Occupational History  . None  Tobacco Use  . Smoking status: Never Smoker  . Smokeless tobacco: Never Used  Substance and Sexual Activity  . Alcohol use: No    Alcohol/week: 0.0 oz  . Drug use: No  . Sexual activity: Yes    Partners: Female  Other Topics Concern  . None  Social History Narrative  . None    Family History  Problem Relation Age of Onset  . Hypertension  Mother   . Hypertension Brother      ROS Review of Systems See HPI Constitution: No fevers or chills No malaise No diaphoresis Skin: No rash or itching Eyes: no blurry vision, no double vision GU: no dysuria or hematuria Neuro: no dizziness or headaches  all others reviewed and negative   Objective: Vitals:   06/15/17 1143  BP: (!) 151/83  Pulse: 84  Resp: 17  Temp: 97.7 F (36.5 C)  TempSrc: Oral  SpO2: 100%  Weight: 146 lb 3.2 oz (66.3 kg)  Height: 5\' 10"  (1.778 m)    Physical Exam General: alert, oriented, in NAD Head: normocephalic, atraumatic, no sinus tenderness Eyes: EOM intact, no scleral icterus or conjunctival injection Ears: TM clear bilaterally Nose: mucosa nonerythematous, nonedematous Throat: no pharyngeal exudate or erythema Lymph: no posterior auricular, submental or cervical lymph adenopathy Heart: normal rate, normal sinus rhythm, no murmurs Lungs: clear to auscultation bilaterally, no wheezing   Assessment and Plan Nichoals was seen today for uri.  Diagnoses and all orders for this visit:  Chronic rhinitis Discussed stress mgmt as he stress seems to be aggravating his headache For his rhinitis he should continue flonase  Other migraine without status migrainosus, not intractable For headaches take Fioricet Also flexeril to help the headaches as well  Chronic pelvic pain in male -     cyclobenzaprine (FLEXERIL) 10 MG tablet; Take 1 tablet (10 mg total) by mouth 3 (three) times daily as needed for muscle spasms. Advised to follow up with Pelvic Floor rehab Refilled the flexeril        Jola Critzer A Nolon Rod

## 2017-06-15 NOTE — Patient Instructions (Addendum)
IF you received an x-ray today, you will receive an invoice from [facility] Radiology. Please contact [facility] Radiology at [phone] with questions or concerns regarding your invoice.   IF you received labwork today, you will receive an invoice from [facility]. Please contact [name] at [phone] with questions or concerns regarding your invoice.   Our billing staff will not be able to assist you with questions regarding bills from these companies.  You will be contacted with the lab results as soon as they are available. The fastest way to get your results is to activate your [name] account. Instructions are located on the last page of this paperwork. If you have not heard from [place] regarding the results in 2 weeks, please contact this office.     Nonallergic Rhinitis Nonallergic rhinitis is a condition that causes symptoms that affect the nose, such as a runny nose and a stuffed-up nose (nasal congestion) that can make it hard to breathe through the nose. This condition is different from having an allergy (allergic rhinitis). Allergic rhinitis occurs when the body's defense system (immune system) reacts to a substance that you are allergic to (allergen), such as pollen, pet dander, mold, or dust. Nonallergic rhinitis has many similar symptoms, but it is not caused by allergens. Nonallergic rhinitis can be a short-term or long-term problem. What are the causes? This condition can be caused by many different things. Some common types of nonallergic rhinitis include: Infectious rhinitis  This is usually due to an infection in the upper respiratory tract. Vasomotor rhinitis  This is the most common type of long-term nonallergic rhinitis.  It is caused by too much blood flow through the nose, which makes the tissue inside of the nose swell.  Symptoms are often triggered by strong odors, cold air, stress, drinking alcohol, cigarette smoke, or changes in the weather. Occupational  rhinitis  This type is caused by triggers in the workplace, such as chemicals, dusts, animal dander, or air pollution. Hormonal rhinitis  This type occurs in women as a result of an increase in the male hormone estrogen.  It may occur during pregnancy, puberty, and menstrual cycles.  Symptoms improve when estrogen levels drop. Drug-induced rhinitis Several drugs can cause nonallergic rhinitis, including:  Medicines that are used to treat high blood pressure, heart disease, and Parkinson disease.  Aspirin and NSAIDs.  Over-the-counter nasal decongestant sprays. These can cause a type of nonallergic rhinitis (rhinitis medicamentosa) when they are used for more than a few days.  Nonallergic rhinitis with eosinophilia syndrome (NARES)  This type is caused by having too much of a certain type of white blood cell (eosinophil). Nonallergic rhinitis can also be caused by a reaction to eating hot or spicy foods. This does not usually cause long-term symptoms. In some cases, the cause of nonallergic rhinitis is not known. What increases the risk? You are more likely to develop this condition if:  You are 34-74 years of age.  You are a woman. Women are twice as likely to have this condition.  What are the signs or symptoms? Common symptoms of this condition include:  Nasal congestion.  Runny nose.  The feeling of mucus going down the back of the throat (postnasal drip).  Trouble sleeping at night and daytime sleepiness.  Less common symptoms include:  Sneezing.  Coughing.  Itchy nose.  Bloodshot eyes.  How is this diagnosed? This condition may be diagnosed based on:  Your symptoms and medical history.  A physical exam.  Allergy testing to rule out allergic rhinitis. You may have skin tests or blood tests.  In some cases, the health care provider may take a swab of nasal secretions to look for an increased number of eosinophils. This would be done to confirm a  diagnosis of NARES. How is this treated? Treatment for this condition depends on the cause. No single treatment works for everyone. Work with your health care provider to find the best treatment for you. Treatment may include:  Avoiding the things that trigger your symptoms.  Using medicines to relieve congestion, such as: ? Steroid nasal spray. There are many types. You may need to try a few to find out which one works best. ? Decongestant medicine. This may be an oral medicine or a nasal spray. These medicines are only used for a short time.  Using medicines to relieve a runny nose. These may include antihistamine medicines or anticholinergic nasal sprays.  Surgery to remove tissue from inside the nose may be needed in severe cases if the condition has not improved after 6-12 months of medical treatment. Follow these instructions at home:  Take or use over-the-counter and prescription medicines only as told by your health care provider. Do not stop using your medicine even if you start to feel better.  Use salt-water (saline) rinses or other solutions (nasal washes or irrigations) to wash or rinse out the inside of your nose as told by your health care provider.  Do not take NSAIDs or medicines that contain aspirin if they make your symptoms worse.  Do not drink alcohol if it makes your symptoms worse.  Do not use any tobacco products, such as cigarettes, chewing tobacco, and e-cigarettes. If you need help quitting, ask your health care provider.  Avoid secondhand smoke.  Get some exercise every day. Exercise may help reduce symptoms of nonallergic rhinitis for some people. Ask your health care provider how much exercise and what types of exercise are safe for you.  Sleep with the head of your bed raised (elevated). This may reduce nighttime nasal congestion.  Keep all follow-up visits as told by your health care provider. This is important. Contact a health care provider if:  You  have a fever.  Your symptoms are getting worse at home.  Your symptoms are not responding to medicine.  You develop new symptoms, especially a headache or nosebleed. This information is not intended to replace advice given to you by your health care provider. Make sure you discuss any questions you have with your health care provider. Document Released: 08/08/2015 Document Revised: 09/22/2015 Document Reviewed: 07/07/2015 Elsevier Interactive Patient Education  2018 [employer].

## 2017-06-18 ENCOUNTER — Other Ambulatory Visit: Payer: Self-pay

## 2017-06-18 ENCOUNTER — Ambulatory Visit: Payer: PPO | Attending: Family Medicine | Admitting: Physical Therapy

## 2017-06-18 ENCOUNTER — Encounter: Payer: Self-pay | Admitting: Physical Therapy

## 2017-06-18 DIAGNOSIS — M6281 Muscle weakness (generalized): Secondary | ICD-10-CM | POA: Insufficient documentation

## 2017-06-18 DIAGNOSIS — M62838 Other muscle spasm: Secondary | ICD-10-CM | POA: Diagnosis not present

## 2017-06-18 DIAGNOSIS — R279 Unspecified lack of coordination: Secondary | ICD-10-CM | POA: Diagnosis not present

## 2017-06-18 NOTE — Therapy (Signed)
[facility] [address], [place], [name], [address] Phone: [phone]   Fax:  [phone]  Physical Therapy Evaluation  Patient Details  Name: William [name] MRN: [id number] Date of Birth: 11-Dec-1960 Referring Provider: Dr. [name]   Encounter Date: 06/18/2017  PT End of Session - 06/18/17 1119    Visit Number  1    Date for PT Re-Evaluation  08/13/17    Authorization Type  [employer]    PT Start Time  1015    PT Stop Time  1110    PT Time Calculation (min)  55 min    Activity Tolerance  Patient tolerated treatment well    Behavior During Therapy  [facility] for tasks assessed/performed       Past Medical History:  Diagnosis Date  . Allergy   . Anxiety   . Depression   . Gastroparesis   . Heart murmur   . Migraine headache   . Mitral valve prolapse     History reviewed. No pertinent surgical history.  There were no vitals filed for this visit.   Subjective Assessment - 06/18/17 1012    Subjective  Pain in left testicle. Patient reports pelvic pain since 2016.  Patient reports pain in back and in left hip.  Patient has had physical therapy in the past for it.  Present MD said he has pelvic pain. Patient feels he may of lifting something too heavy.     Patient Stated Goals  reduce pain    Currently in Pain?  Yes    Pain Score  8     Pain Location  Groin lower left back    Pain Orientation  Left    Pain Descriptors / Indicators  Throbbing;Constant    Pain Type  Chronic pain    Pain Radiating Towards  tingling/numbness down lateral left leg to ankle    Pain Onset  More than a month ago    Pain Frequency  Constant    Aggravating Factors   sit up straight; sitting still too long; lay on left side    Pain Relieving Factors  lay on back    Multiple Pain Sites  No         [facility] PT Assessment - 06/18/17 0001      Assessment   Medical Diagnosis  R10.2, G89.29 Chronic pelvic pain in male; N50.82  Scrotal pain    Referring Provider  Dr. [name]    Onset Date/Surgical Date  04/30/14    Prior Therapy  yes       Precautions   Precautions  Other (comment)    Precaution Comments  exercise too much will increase migrain      Restrictions   Weight Bearing Restrictions  No      Balance Screen   Has the patient fallen in the past 6 months  No    Has the patient had a decrease in activity level because of a fear of falling?   No    Is the patient reluctant to leave their home because of a fear of falling?   No      Home Film/video editor residence      Prior Function   Level of Independence  Independent    Vocation  On disability    Leisure  trouble exercising due to migraines      Cognition   Overall Cognitive Status  Within Functional Limits for tasks assessed  Observation/Other Assessments   Focus on Therapeutic Outcomes (FOTO)   Patient limited by 20% due to pain in left groin and back      Posture/Postural Control   Posture/Postural Control  Postural limitations    Postural Limitations  Rounded Shoulders;Forward head;Flexed trunk sitting      ROM / Strength   AROM / PROM / Strength  AROM;PROM;Strength      AROM   Lumbar - Right Rotation  decreased by 25%    Lumbar - Left Rotation  decreased by 25%      Strength   Left Hip Flexion  3+/5 pain in left back    Left Hip ABduction  4/5    Left Hip ADduction  4/5      Flexibility   Soft Tissue Assessment /Muscle Length  yes    Hamstrings  decreased bilaterally    Quadriceps  prone knee flexion right 95 degrees, left  102 degrees      Palpation   SI assessment   ASIS are equal but left shallow compared to left    Palpation comment  tender in right psoas, left suprapubic bone, left psoas. right  obliques, bil. hip adductors, along the right ischiocarvernosus,       Transfers   Transfers  Not assessed      Ambulation/Gait   Ambulation/Gait  No      Balance   Balance Assessed  --  difficulty with standing on one leg bil. increased body sway             Objective measurements completed on examination: See above findings.    Pelvic Floor Special Questions - 06/18/17 0001    Currently Sexually Active  No    Is this Painful  No    Marinoff Scale  -- unable to get an erection;     Urinary Leakage  No    Urinary frequency  urinates every couple of hours, sometimes has the urge to urinate and has no urine decreased urine stream    Fecal incontinence  No    Skin Integrity  Intact    Pelvic Floor Internal Exam  Patient confirms identification and approves PT to assess muscle integrity and treatment    Exam Type  Rectal left sidely    Palpation  tenderness located in left obturator internist, left levator ani, tight puborectalis    Strength  weak squeeze, no lift due to muscles being so tight    Tone  increased in levator ani, anal sphincter       [facility] Adult PT Treatment/Exercise - 06/18/17 0001      Therapeutic Activites    Therapeutic Activities  ADL's    ADL's  sit to stand and back with increased spinal extension, sitting without slouching      Manual Therapy   Manual Therapy  Internal Pelvic Floor;Soft tissue mobilization    Soft tissue mobilization  left quadratus, longissimus, left lumbar paraspinals, and left iliopsoas    Internal Pelvic Floor  left obturator internist and obturator internist       Trigger Point Dry Needling - 06/18/17 1136    Consent Given?  Yes    Education Handout Provided  Yes    Muscles Treated Upper Body  Longissimus;Iliopsoas;Quadratus Lumborum L3-L5 multifidi, all left side muscles    Longissimus Response  Twitch response elicited;Palpable increased muscle length           PT Education - 06/18/17 1114    Education provided  Yes  Education Details  information on dry needling    Person(s) Educated  Patient    Methods  Explanation;Handout    Comprehension  Verbalized understanding       PT Short Term Goals -  06/18/17 1130      PT SHORT TERM GOAL #1   Title  independent with stretches to elongate muscles    Time  4    Period  Weeks    Target Date  07/16/17      PT SHORT TERM GOAL #2   Title  ability to demonstrate correct posture with sitting and daily tasks    Time  4    Period  Weeks    Status  New    Target Date  07/16/17      PT SHORT TERM GOAL #3   Title  pain with laying on left side decreased >/= 25% due to muscle elongation    Time  4    Period  Weeks    Status  New    Target Date  07/16/17      PT SHORT TERM GOAL #4   Title  sit upright with pain decreased >/= 25% due to reduction in muscle trigger points    Time  4    Period  Weeks    Status  New    Target Date  07/16/17        PT Long Term Goals - 06/18/17 1132      PT LONG TERM GOAL #1   Title  independent with HEP for flexibility and strengthening    Time  8    Period  Weeks    Status  New    Target Date  08/13/17      PT LONG TERM GOAL #2   Title  pain with laying on left side decreased >/= 75% due to improve mobility     Time  8    Period  Weeks    Status  New    Target Date  08/13/17      PT LONG TERM GOAL #3   Title  pain with sitting for 45 minutes with correct posture to decrease strain on muscles decreased >/= 75%    Time  8    Period  Weeks    Status  New    Target Date  08/13/17      PT LONG TERM GOAL #4   Title  ability to urinate when he has the urge due to pelvic floor muscles able to relax to let the urine flow    Time  8    Period  Weeks    Status  New    Target Date  08/13/17      PT LONG TERM GOAL #5   Title  pelvic floor strength increased to 3/5 due to ability to relax fully and contract the anal sphincter    Time  8    Period  Weeks    Status  New    Target Date  08/13/17             Plan - 06/18/17 1120    Clinical Impression Statement  Patient is a 57 year old male with chronic pelvic and left back pain since 2016. Patient feels he may of picked up a heavy  item. Patient reports his pain level is 8/10 located in left groin, left testicle, and left low back.  Pain will increase with sitting too long, sitting upright, and laying on left  side.  When the patient has the urge to urinate sometimes no urine will come out.  Patient reports a weak urinary stream.  Patient reports erectile dysfunction.  Left hip strength is 4/5 and anal sphincter strength is 1/5 due to muscles being too tight.  Palpable tenderness located in bilateral psoas, right abdominals, abdominal insertion on the left pubic bone, left quadratus, left obturator internist, left levator ani.  Negative FABER TEST. After dry needling with soft tissue work pain decreased to 3/10.  Patient sits with a slouched posture.  Patient will benefit from skilled therapy to reduce trigger points in the abdominals and pelvic floor to improve function and urinar issues.     History and Personal Factors relevant to plan of care:  Depression, Anxiety, Gastroporesis, Migaines    Clinical Presentation  Evolving    Clinical Presentation due to:  pain preventing from doing daily activities and poor posture     Clinical Decision Making  Moderate    Rehab Potential  Excellent    Clinical Impairments Affecting Rehab Potential  Depression, Anxiety, Gastroporesis, Migaines    PT Frequency  2x / week    PT Duration  8 weeks    PT Treatment/Interventions  Biofeedback;Cryotherapy;Electrical Stimulation;Moist Heat;Ultrasound;Therapeutic exercise;Therapeutic activities;Neuromuscular re-education;Patient/family education;Manual techniques;Dry needling    PT Next Visit Plan  dry needling, soft tissue work, diaphgramatic breathing, left hip mobs    PT Home Exercise Plan  posture; toileting, hip stretches    Consulted and Agree with Plan of Care  Patient       Patient will benefit from skilled therapeutic intervention in order to improve the following deficits and impairments:  Increased fascial restricitons, Pain, Increased  muscle spasms, Postural dysfunction, Decreased activity tolerance, Decreased endurance, Decreased strength, Impaired flexibility  Visit Diagnosis: Muscle weakness (generalized) - Plan: PT plan of care cert/re-cert  Other muscle spasm - Plan: PT plan of care cert/re-cert  Unspecified lack of coordination - Plan: PT plan of care cert/re-cert     Problem List Patient Active Problem List   Diagnosis Date Noted  . Vitamin D deficiency 05/28/2017  . Scrotal pain 05/28/2017  . Paroxysmal SVT (supraventricular tachycardia) ([place]) 05/28/2017  . Mild left inguinal hernia 11/23/2016  . Chronic pelvic pain in male 09/28/2016  . Abdominal pain, left lower quadrant 09/28/2016  . Isolated proteinuria without specific morphologic lesion 06/27/2016  . Left testicular pain 06/27/2016  . Dry skin dermatitis 06/27/2016  . Acute left flank pain 06/27/2016  . Pelvic floor dysfunction 04/01/2016  . GERD (gastroesophageal reflux disease) 10/13/2015  . Gastroparesis 10/13/2015  . Intractable chronic migraine without aura and without status migrainosus 12/15/2013  . Hyperlipidemia 04/04/2012  . Mitral valve disease 07/15/2009  . IRRITABLE BOWEL SYNDROME 07/15/2009  . Palpitations 06/28/2009    Earlie Counts, PT 06/18/17 11:41 AM   [facility] [address], [place], [name], [address] Phone: [phone]   Fax:  [phone]  Name: William [name] MRN: [id number] Date of Birth: 23-Mar-1961

## 2017-06-18 NOTE — Patient Instructions (Signed)

## 2017-06-19 ENCOUNTER — Other Ambulatory Visit: Payer: Self-pay

## 2017-06-19 ENCOUNTER — Ambulatory Visit: Payer: PPO | Admitting: Physical Therapy

## 2017-06-19 ENCOUNTER — Encounter: Payer: Self-pay | Admitting: Physical Therapy

## 2017-06-19 ENCOUNTER — Ambulatory Visit (INDEPENDENT_AMBULATORY_CARE_PROVIDER_SITE_OTHER): Payer: PPO | Admitting: Family Medicine

## 2017-06-19 ENCOUNTER — Encounter: Payer: Self-pay | Admitting: Family Medicine

## 2017-06-19 VITALS — BP 126/79 | HR 88 | Resp 16 | Ht 70.0 in | Wt 146.4 lb

## 2017-06-19 DIAGNOSIS — M2550 Pain in unspecified joint: Secondary | ICD-10-CM | POA: Diagnosis not present

## 2017-06-19 DIAGNOSIS — M6289 Other specified disorders of muscle: Secondary | ICD-10-CM

## 2017-06-19 DIAGNOSIS — M791 Myalgia, unspecified site: Secondary | ICD-10-CM

## 2017-06-19 DIAGNOSIS — R279 Unspecified lack of coordination: Secondary | ICD-10-CM

## 2017-06-19 DIAGNOSIS — M6281 Muscle weakness (generalized): Secondary | ICD-10-CM

## 2017-06-19 DIAGNOSIS — R002 Palpitations: Secondary | ICD-10-CM | POA: Diagnosis not present

## 2017-06-19 DIAGNOSIS — R102 Pelvic and perineal pain: Secondary | ICD-10-CM

## 2017-06-19 DIAGNOSIS — G8929 Other chronic pain: Secondary | ICD-10-CM

## 2017-06-19 DIAGNOSIS — I471 Supraventricular tachycardia: Secondary | ICD-10-CM

## 2017-06-19 DIAGNOSIS — R800 Isolated proteinuria: Secondary | ICD-10-CM

## 2017-06-19 DIAGNOSIS — M62838 Other muscle spasm: Secondary | ICD-10-CM

## 2017-06-19 DIAGNOSIS — G43709 Chronic migraine without aura, not intractable, without status migrainosus: Secondary | ICD-10-CM

## 2017-06-19 LAB — COLOGUARD: COLOGUARD: NEGATIVE

## 2017-06-19 MED ORDER — DIAZEPAM 2 MG PO TABS: 1.0000 mg | ORAL_TABLET | Freq: Four times a day (QID) | ORAL | 1 refills | Status: DC | PRN

## 2017-06-19 NOTE — Patient Instructions (Addendum)
Use 1/2 tab of diazepam with 1-2 tylenol for body spasms/pain instead of the butalbital. Continue to use the diltiazem 30mg  1-2x/day to prevent palpitations - pretreat for situations likely to induce palpitations.    IF you received an x-ray today, you will receive an invoice from [facility] Radiology. Please contact [facility] Radiology at [phone] with questions or concerns regarding your invoice.   IF you received labwork today, you will receive an invoice from [facility]. Please contact [name] at [phone] with questions or concerns regarding your invoice.   Our billing staff will not be able to assist you with questions regarding bills from these companies.  You will be contacted with the lab results as soon as they are available. The fastest way to get your results is to activate your [name] account. Instructions are located on the last page of this paperwork. If you have not heard from [place] regarding the results in 2 weeks, please contact this office.

## 2017-06-19 NOTE — Progress Notes (Signed)
Subjective:  By signing my name below, I, William [name], attest that this documentation has been prepared under the direction and in the presence of William Cheadle, MD. Electronically Signed: Moises [name], [place]. 06/19/2017 , 2:12 PM .  Patient was seen in Room 1 .   Patient ID: William [name], male    DOB: [birth year], 57 y.o.   MRN: [id number] Chief Complaint  Patient presents with  . Medication Problem    patient has questions regarding several of his medications, would like to discuss with provider. Patient would also like to discuss PT appointment   HPI William [name] is a 57 y.o. male who presents to Primary Care at [facility] for follow up.   Left groin pain - physical therapy He had initial pelvic floor PT appointment with Earlie Counts yesterday. Recommended dry needling, for left low back pain, left groin and left testicle. Plan to see them 2 times a week for 8 weeks.   He's really happy with the physical therapy. He's done acupuncture done in the past and didn't have problems with needling. He's was still tightening up in the mornings, so he's been taking butalbital for pain. He's taken Flexeril about 3 times in the last week. He's taken tramadol in the past, but it caused him to become more agitated.   Cardiology He was seen by cardiology (Dr. Angelena Form) 2 weeks ago, to follow up for his PVC's. He did not tolerate beta blockers. He was not using propranolol as it caused diarrhea, and also did not tolerate Zetia for cholesterol due to diarrhea. He did have 2 episodes of palpitations while wearing cardiac monitor, which confirmed SVT so started on Cardizem CD 120 QD and was taught vagal maneuvers and prn IR diltiazem for breakthrough. Recheck in 3 months if frequent reoccurrence, can discuss ablation.   He tried taking Cardizem but notes that he gets flutters without any relief. He informs the flutters occur any time of the day, always followed by stress. He isn't sure if he had  flutters due to the upcoming PT appointments. He does note feeling better just on diltiazem in the morning without symptoms recurring; comes into effect within 30 minutes after taking it. He's also been exercising with some walking and some weight resistant exercises.   Nephrology He saw Dr. Hollie Salk at [facility] 1 week previously. He was not able to tolerate lisinopril 5mg  or 2.5mg  due to dizziness by hypotension. Urinalysis was negative for protein, total urine protein 11.4 mg/dL, protein creatinine ratio 109, normal 0-200. Albumin to creatinine ratio 15.8. Suspect proteinuria due to copious NSAID use due to migraines. But as long as only mild amount, can just monitor annually. Noted that diltiazem can have a small anti-proteinuric effect. Dr. Hollie Salk placed referral to [facility] neurology to discuss Botox for migraines.   Dry Eyes He's been using xiidra for dry eyes, but reports hasn't been really effective.   Past Medical History:  Diagnosis Date  . Allergy   . Anxiety   . Depression   . Gastroparesis   . Heart murmur   . Migraine headache   . Mitral valve prolapse    No past surgical history on file. Prior to Admission medications   Medication Sig Start Date End Date Taking? Authorizing Provider  butalbital-aspirin-caffeine [facility]) [phone] MG capsule Take 1 capsule by mouth 2 (two) times daily as needed for headache. 04/26/17  Yes Sagardia, Ines Bloomer, MD  cyclobenzaprine (FLEXERIL) 10 MG tablet Take 1 tablet (10 mg total)  by mouth 3 (three) times daily as needed for muscle spasms. 06/15/17  Yes Forrest Moron, MD  diltiazem (CARDIZEM CD) 120 MG 24 hr capsule Take 1 capsule (120 mg total) by mouth daily. 06/03/17 09/01/17 Yes Burnell Blanks, MD  diltiazem (CARDIZEM) 30 MG tablet Take 1 tablet (30 mg total) by mouth as needed. TAKE FOR PALPITATIONS 03/26/17  Yes Imogene Burn, PA-C  fluticasone [facility]) 50 MCG/ACT nasal spray Place 2 sprays daily into both nostrils.  03/18/17  Yes Stallings, Zoe A, MD  Lifitegrast [facility]) 5 % SOLN Apply 1 drop 2 (two) times daily to eye. 03/06/17  Yes Shawnee Knapp, MD  ondansetron (ZOFRAN) 4 MG tablet Take by mouth.   Yes [provider]  promethazine (PHENERGAN) 12.5 MG tablet Take 1 tablet (12.5 mg total) by mouth every 8 (eight) hours as needed (migraine headache). 05/28/17  Yes Shawnee Knapp, MD  triamcinolone (NASACORT) 55 MCG/ACT AERO nasal inhaler Place 2 sprays into the nose daily. 04/26/17  Yes Sagardia, Ines Bloomer, MD  Vitamin D, Ergocalciferol, (DRISDOL) 50000 units CAPS capsule Take 1 capsule (50,000 Units total) by mouth every 7 (seven) days. 05/05/16  Yes Shawnee Knapp, MD  ibuprofen (ADVIL,MOTRIN) 200 MG tablet Take by mouth.    [provider]  metoCLOPramide (REGLAN) 5 MG tablet Take by mouth.    [provider]  mirtazapine (REMERON) 45 MG tablet Take by mouth.    [provider]  traMADol (ULTRAM) 50 MG tablet Take 1 tablet (50 mg total) by mouth every 8 (eight) hours as needed. Patient not taking: Reported on 06/19/2017 12/15/16   Shawnee Knapp, MD   Allergies  Allergen Reactions  . Erythromycin Nausea And Vomiting and Anaphylaxis  . Hydrocodone-Acetaminophen Nausea And Vomiting  . Cephalexin Diarrhea and Nausea And Vomiting  . Cephalosporins Diarrhea and Nausea And Vomiting  . Compazine [Prochlorperazine Edisylate]     uncontrollable shake - tremor  . Ketoconazole Itching  . Ketorolac Nausea And Vomiting  . Latex Itching    REACTION: Reaction not known  . Nalbuphine Other (See Comments)    States loss of consciousness REACTION: Reaction not known  . Oxycodone-Acetaminophen Other (See Comments) and Swelling  . Prednisone Nausea And Vomiting  . Propranolol Diarrhea  . Restasis [Cyclosporine] Other (See Comments)    migraines  . Topamax [Topiramate] Diarrhea and Nausea And Vomiting  . Zetia [Ezetimibe] Diarrhea  . Cefdinir Diarrhea, Nausea And Vomiting and Rash  .  Neosporin [Neomycin-Bacitracin Zn-Polymyx] Rash   Family History  Problem Relation Age of Onset  . Hypertension Mother   . Hypertension Brother    Social History   Socioeconomic History  . Marital status: Married    Spouse name: None  . Number of children: None  . Years of education: None  . Highest education level: None  Social Needs  . Financial resource strain: None  . Food insecurity - worry: None  . Food insecurity - inability: None  . Transportation needs - medical: None  . Transportation needs - non-medical: None  Occupational History  . None  Tobacco Use  . Smoking status: Never Smoker  . Smokeless tobacco: Never Used  Substance and Sexual Activity  . Alcohol use: No    Alcohol/week: 0.0 oz  . Drug use: No  . Sexual activity: Yes    Partners: Female  Other Topics Concern  . None  Social History Narrative  . None   Depression screen [facility] 2/9 06/19/2017 06/15/2017 05/28/2017  04/26/2017 04/22/2017  Decreased Interest 0 0 0 0 0  Down, Depressed, Hopeless 0 0 0 0 0  PHQ - 2 Score 0 0 0 0 0  Altered sleeping - - - - -  Tired, decreased energy - - - - -  Change in appetite - - - - -  Feeling bad or failure about yourself  - - - - -  Trouble concentrating - - - - -  Moving slowly or fidgety/restless - - - - -  Suicidal thoughts - - - - -  PHQ-9 Score - - - - -  Some recent data might be hidden    Review of Systems  Constitutional: Negative for fatigue and unexpected weight change.  Eyes: Negative for visual disturbance.  Respiratory: Negative for cough, chest tightness and shortness of breath.   Cardiovascular: Negative for chest pain, palpitations and leg swelling.  Gastrointestinal: Negative for abdominal pain and [name] in stool.  Musculoskeletal: Positive for myalgias.  Neurological: Negative for dizziness, light-headedness and headaches.       Objective:   Physical Exam  Constitutional: He is oriented to person, place, and time. He appears  well-developed and well-nourished. No distress.  HENT:  Head: Normocephalic and atraumatic.  Eyes: EOM are normal. Pupils are equal, round, and reactive to light.  Neck: Neck supple.  Cardiovascular: Normal rate, regular rhythm and normal heart sounds.  No murmur heard. Pulmonary/Chest: Effort normal. No respiratory distress.  Musculoskeletal: Normal range of motion.  Neurological: He is alert and oriented to person, place, and time.  Skin: Skin is warm and dry.  Psychiatric: He has a normal mood and affect. His behavior is normal.  Nursing note and vitals reviewed.   BP 126/79 (BP Location: Right Arm, Patient Position: Sitting, Cuff Size: Normal)   Pulse 88   Resp 16   Ht 5\' 10"  (1.778 m)   Wt 146 lb 6.4 oz (66.4 kg)   SpO2 99%   BMI 21.01 kg/m      Assessment & Plan:   1. Myalgia   2. Arthralgia of multiple sites, bilateral   3. Paroxysmal SVT (supraventricular tachycardia) (HCC)   4. Palpitations   5. Isolated proteinuria without specific morphologic lesion   6. Chronic pelvic pain in male   7. Pelvic floor dysfunction   8. Chronic migraine without aura without status migrainosus, not intractable     Meds ordered this encounter  Medications  . diazepam (VALIUM) 2 MG tablet    Sig: Take 0.5 tablets (1 mg total) by mouth every 6 (six) hours as needed for muscle spasms. Accompanied by 2 tabs acetaminophen    Dispense:  30 tablet    Refill:  1    I personally performed the services described in this documentation, which was scribed in my presence. The recorded information has been reviewed and considered, and addended by me as needed.   William [name], M.D.  Primary Care at [facility] [address] [place], [place] [address] [phone] phone [phone] fax  06/22/17 2:04 AM

## 2017-06-19 NOTE — Therapy (Signed)
[facility] [address], [place], [name], [address] Phone: [phone]   Fax:  [phone]  Physical Therapy Treatment  Patient Details  Name: William [name] MRN: [id number] Date of Birth: 10-11-1960 Referring Provider: Dr. [name]   Encounter Date: 06/19/2017  PT End of Session - 06/19/17 1011    Visit Number  2    Date for PT Re-Evaluation  08/13/17    Authorization Type  [employer]    PT Start Time  0930    PT Stop Time  1011    PT Time Calculation (min)  41 min    Activity Tolerance  Patient tolerated treatment well    Behavior During Therapy  [facility] for tasks assessed/performed       Past Medical History:  Diagnosis Date  . Allergy   . Anxiety   . Depression   . Gastroparesis   . Heart murmur   . Migraine headache   . Mitral valve prolapse     History reviewed. No pertinent surgical history.  There were no vitals filed for this visit.  Subjective Assessment - 06/19/17 0934    Subjective  I had some relief since the appointment.     Patient Stated Goals  reduce pain    Currently in Pain?  Yes    Pain Score  4     Pain Location  Groin    Pain Orientation  Left    Pain Descriptors / Indicators  Constant;Throbbing    Pain Type  Chronic pain    Pain Radiating Towards  Tingling/numbness down lateral left leg to ankle    Pain Onset  More than a month ago    Pain Frequency  Constant    Aggravating Factors   sit up straight; sitting still too long; lay left side    Pain Relieving Factors  lay on back    Multiple Pain Sites  No                      [facility] Adult PT Treatment/Exercise - 06/19/17 0001      Manual Therapy   Manual Therapy  Joint mobilization    Manual therapy comments  manual stretch of left hip in all directions    Joint Mobilization  P-A mobilization to T11-L5 and side glide, anterior mobilization to left hip in pron; with Mulligan belt grade 3 to left hip for  distraction, inferior glide, and lateral glide    Soft tissue mobilization  left quadratus, longissimus, left lumbar paraspinals, and left iliopsoas       Trigger Point Dry Needling - 06/18/17 1136    Consent Given?  Yes    Education Handout Provided  Yes    Muscles Treated Upper Body  Longissimus;Iliopsoas;Quadratus Lumborum L3-L5 multifidi, all left side muscles    Longissimus Response  Twitch response elicited;Palpable increased muscle length           PT Education - 06/19/17 1009    Education provided  Yes    Education Details  relaxation of the pelvic floor and stretches    Person(s) Educated  Patient    Methods  Explanation;Demonstration;Handout;Verbal cues    Comprehension  Verbalized understanding;Returned demonstration       PT Short Term Goals - 06/18/17 1130      PT SHORT TERM GOAL #1   Title  independent with stretches to elongate muscles    Time  4    Period  Weeks  Target Date  07/16/17      PT SHORT TERM GOAL #2   Title  ability to demonstrate correct posture with sitting and daily tasks    Time  4    Period  Weeks    Status  New    Target Date  07/16/17      PT SHORT TERM GOAL #3   Title  pain with laying on left side decreased >/= 25% due to muscle elongation    Time  4    Period  Weeks    Status  New    Target Date  07/16/17      PT SHORT TERM GOAL #4   Title  sit upright with pain decreased >/= 25% due to reduction in muscle trigger points    Time  4    Period  Weeks    Status  New    Target Date  07/16/17        PT Long Term Goals - 06/18/17 1132      PT LONG TERM GOAL #1   Title  independent with HEP for flexibility and strengthening    Time  8    Period  Weeks    Status  New    Target Date  08/13/17      PT LONG TERM GOAL #2   Title  pain with laying on left side decreased >/= 75% due to improve mobility     Time  8    Period  Weeks    Status  New    Target Date  08/13/17      PT LONG TERM GOAL #3   Title  pain with  sitting for 45 minutes with correct posture to decrease strain on muscles decreased >/= 75%    Time  8    Period  Weeks    Status  New    Target Date  08/13/17      PT LONG TERM GOAL #4   Title  ability to urinate when he has the urge due to pelvic floor muscles able to relax to let the urine flow    Time  8    Period  Weeks    Status  New    Target Date  08/13/17      PT LONG TERM GOAL #5   Title  pelvic floor strength increased to 3/5 due to ability to relax fully and contract the anal sphincter    Time  8    Period  Weeks    Status  New    Target Date  08/13/17            Plan - 06/19/17 1012    Clinical Impression Statement  After therapy, patient reports no left hip pain.  Patient has improve mobility of left hip after joint mobilization.  Patient was able to extend left hip further after manual skills. Patient has improve quadratus muscle mobility.  Patient was able to stand taller after therapy.  Patient will benefi tfrom skilled therapy to reduce trigger points in the abdomen and pelvic floor to improve function and urnary issues.     Rehab Potential  Excellent    Clinical Impairments Affecting Rehab Potential  Depression, Anxiety, Gastroporesis, Migaines    PT Frequency  2x / week    PT Duration  8 weeks    PT Treatment/Interventions  Biofeedback;Cryotherapy;Electrical Stimulation;Moist Heat;Ultrasound;Therapeutic exercise;Therapeutic activities;Neuromuscular re-education;Patient/family education;Manual techniques;Dry needling    PT Next Visit Plan  work on pelvic floor;  abdominal bracing, bridges    PT Home Exercise Plan  posture; toileting, hip stretches    Consulted and Agree with Plan of Care  Patient       Patient will benefit from skilled therapeutic intervention in order to improve the following deficits and impairments:  Increased fascial restricitons, Pain, Increased muscle spasms, Postural dysfunction, Decreased activity tolerance, Decreased endurance,  Decreased strength, Impaired flexibility  Visit Diagnosis: Muscle weakness (generalized)  Other muscle spasm  Unspecified lack of coordination     Problem List Patient Active Problem List   Diagnosis Date Noted  . Vitamin D deficiency 05/28/2017  . Scrotal pain 05/28/2017  . Paroxysmal SVT (supraventricular tachycardia) ([place]) 05/28/2017  . Mild left inguinal hernia 11/23/2016  . Chronic pelvic pain in male 09/28/2016  . Abdominal pain, left lower quadrant 09/28/2016  . Isolated proteinuria without specific morphologic lesion 06/27/2016  . Left testicular pain 06/27/2016  . Dry skin dermatitis 06/27/2016  . Acute left flank pain 06/27/2016  . Pelvic floor dysfunction 04/01/2016  . GERD (gastroesophageal reflux disease) 10/13/2015  . Gastroparesis 10/13/2015  . Intractable chronic migraine without aura and without status migrainosus 12/15/2013  . Hyperlipidemia 04/04/2012  . Mitral valve disease 07/15/2009  . IRRITABLE BOWEL SYNDROME 07/15/2009  . Palpitations 06/28/2009    Earlie Counts, PT 06/19/17 10:15 AM    [facility] [address], [place], [name], [address] Phone: [phone]   Fax:  [phone]  Name: William [name] MRN: [id number] Date of Birth: 1960-05-23

## 2017-06-19 NOTE — Patient Instructions (Addendum)
Stabilization: Diaphragmatic Breathing    Lie with knees bent, feet flat. Place one hand on stomach, other on chest. Breathe deeply through nose, lifting belly hand without any motion of hand on chest. Repeat  For 5 min. . Do when you have pain and 3 times daily. Copyright  VHI. All rights reserved.   1.Pelvic floor drops: also known as the "reverse kegel" or pelvic floor relaxation exercise. This is lengthening and opening of the pelvic floor muscles; the opposite action of a contraction where the muscles shorten, close, and draw-in cephalically. The patient lies supine on a yoga mat with legs on couch with their knees and hips bent and slightly apart from each other and feet together. Gently push your knees up and out as you resist and breath out.  They should practice this for 3-5 breaths daily and whenever they are having pain. Here is a video demonstrating the pelvic floor drop. Pelvic Floor Drop.[email or web address]   Pelvic Health and [place]    4. Child's pose: the child's pose position with the knees towards your chest and apart is the best position for the pelvic floor muscle relaxation. Because the pelvic floor muscles attach laterally to the obturator fascia, the position of the hips can impact the position of the pelvic floor. Imagine the pelvic floor is a hammock; when the hips are in neutral or extended, the hammock is pulled taut. When the hips are flexed and externally rotated, the hammock is slackened, which allows the muscles to lengthen and drop. This pose for 1-5 minutes combined with breathing and pelvic floor relaxation can relieve pain and improve motor control.Mid-Back Stretch    3. Heat/sitz baths: Applying heat to the abdomen, lower back, and pelvis (with lots of towel layers to protect the skin from burning) for 10-15 minutes per day can help relieve muscle tension, spasms, and pain and improve blood flow. Warm (not hot) baths with epsom salt can also alleviate pelvic  pain related to muscle tension, hemorrhoids, fissures, and irritated tissues. This can also be done while practicing the diaphragmatic breath and pelvic floor drops. Lay down  Hip Flexor Stretch    Lying on back near edge of bed, bend one leg, foot flat. Hang other leg over edge, relaxed, thigh resting entirely on bed for __1__ minutes. Repeat _2___ times. Do __1__ sessions per day. Advanced Exercise: Bend knee back keeping thigh in contact with bed.  http://gt2.exer.us/346   Copyright  VHI. All rights reserved.  [facility] [address], [place], [place] [address] Phone # [phone] Fax [phone]

## 2017-06-20 ENCOUNTER — Ambulatory Visit: Payer: PPO | Admitting: Family Medicine

## 2017-06-22 DIAGNOSIS — G43009 Migraine without aura, not intractable, without status migrainosus: Secondary | ICD-10-CM | POA: Insufficient documentation

## 2017-06-25 ENCOUNTER — Encounter: Payer: PPO | Admitting: Physical Therapy

## 2017-06-27 ENCOUNTER — Encounter: Payer: PPO | Admitting: Physical Therapy

## 2017-07-02 ENCOUNTER — Encounter: Payer: Self-pay | Admitting: Physical Therapy

## 2017-07-02 ENCOUNTER — Ambulatory Visit: Payer: PPO | Attending: Family Medicine | Admitting: Physical Therapy

## 2017-07-02 DIAGNOSIS — M6281 Muscle weakness (generalized): Secondary | ICD-10-CM | POA: Insufficient documentation

## 2017-07-02 DIAGNOSIS — M62838 Other muscle spasm: Secondary | ICD-10-CM

## 2017-07-02 DIAGNOSIS — R279 Unspecified lack of coordination: Secondary | ICD-10-CM | POA: Insufficient documentation

## 2017-07-02 NOTE — Patient Instructions (Addendum)
On Elbows (Prone)    Rise up on elbows as high as possible, keeping hips on floor. Hold __10__ seconds. Repeat __5__ times per set. Do __1__ sets per session. Do __1__ sessions per day.  http://orth.exer.us/92   Copyright  VHI. All rights reserved.    Start in a supine position, one leg bent with foot flat on the bed/floor, and the other leg crossed over the knee.  Gently push the knee of the crossed leg forward until a strong yet pain-free stretch is felt.  Hold for 30 seconds then release.  Repeat 1csets as instructed, then switch sides and repeat.     Step forward with one leg and hold this position.   Next, raise up your arm over head and side bend to the right side until a stretch is felt along the side of your body.Bring your left leg back.  Hold 15 sec 2 times.    Lower Trunk Rotation Stretch    Keeping back flat and feet together, rotate knees to left side. Hold _30___ seconds. Repeat __2__ times per set. Do __1__ sets per session. Do _1___ sessions per day.  http://orth.exer.us/122   Copyright  VHI. All rights reserved.  HIP: Extension / KNEE: Flexion - Prone    Bend knee, squeeze glutes. Raise leg up. __10_ reps per set, _1__ sets per day, __1_ days per week   Copyright  VHI. All rights reserved.  [facility] [address], [place], [place] [address] Phone # [phone] Fax [phone]

## 2017-07-02 NOTE — Therapy (Addendum)
[facility] [address] [place], [name], [address] Phone: [phone]   Fax:  [phone]  Physical Therapy Treatment  Patient Details  Name: William [name] MRN: [id number] Date of Birth: 1960/05/05 Referring Provider: Dr. [name]   Encounter Date: 07/02/2017  PT End of Session - 07/02/17 1145    Visit Number  3    Date for PT Re-Evaluation  08/13/17    Authorization Type  [employer]    PT Start Time  1100    PT Stop Time  1145    PT Time Calculation (min)  45 min    Activity Tolerance  Patient tolerated treatment well    Behavior During Therapy  [facility] for tasks assessed/performed       Past Medical History:  Diagnosis Date  . Allergy   . Anxiety   . Depression   . Gastroparesis   . Heart murmur   . Migraine headache   . Mitral valve prolapse     History reviewed. No pertinent surgical history.  There were no vitals filed for this visit.  Subjective Assessment - 07/02/17 1105    Subjective  I did not come for 1 week due to having migraines.  The exercise I had a big problem is with laying on the floor.     Patient Stated Goals  reduce pain    Currently in Pain?  Yes    Pain Score  4     Pain Location  Groin    Pain Orientation  Left    Pain Descriptors / Indicators  Throbbing    Pain Type  Chronic pain    Pain Radiating Towards  no tingling or numbness in left leg since therapy started    Pain Onset  More than a month ago    Pain Frequency  Intermittent    Aggravating Factors   sit up straight,sitting still too long; lay left side    Pain Relieving Factors  lay on back    Multiple Pain Sites  No                      [facility] Adult PT Treatment/Exercise - 07/02/17 0001      Manual Therapy   Manual Therapy  Joint mobilization;Soft tissue mobilization    Manual therapy comments  manually stretched the left quads    Joint Mobilization  anterior glide of left hip grade 3    Soft tissue mobilization  left iliopsoas, quadratus, left hamstring, left hip adductors       Trigger Point Dry Needling - 07/02/17 1118    Consent Given?  Yes    Muscles Treated Upper Body  Iliopsoas;Quadratus Lumborum    Muscles Treated Lower Body  -- twitch response, muscle elongation           PT Education - 07/02/17 1117    Education provided  Yes    Education Details  trunk stretches, hip extension in prone,     Person(s) Educated  Patient    Methods  Explanation;Demonstration;Verbal cues;Handout    Comprehension  Returned demonstration;Verbalized understanding       PT Short Term Goals - 07/02/17 1149      PT SHORT TERM GOAL #1   Title  independent with stretches to elongate muscles    Time  4    Period  Weeks    Status  Achieved      PT SHORT TERM GOAL #2   Title  ability to demonstrate correct posture with sitting and daily tasks    Time  4    Period  Weeks    Status  Achieved      PT SHORT TERM GOAL #3   Title  pain with laying on left side decreased >/= 25% due to muscle elongation    Time  4    Period  Weeks    Status  On-going      PT SHORT TERM GOAL #4   Title  sit upright with pain decreased >/= 25% due to reduction in muscle trigger points    Time  4    Period  Weeks    Status  On-going        PT Long Term Goals - 06/18/17 1132      PT LONG TERM GOAL #1   Title  independent with HEP for flexibility and strengthening    Time  8    Period  Weeks    Status  New    Target Date  08/13/17      PT LONG TERM GOAL #2   Title  pain with laying on left side decreased >/= 75% due to improve mobility     Time  8    Period  Weeks    Status  New    Target Date  08/13/17      PT LONG TERM GOAL #3   Title  pain with sitting for 45 minutes with correct posture to decrease strain on muscles decreased >/= 75%    Time  8    Period  Weeks    Status  New    Target Date  08/13/17      PT LONG TERM GOAL #4   Title  ability to urinate when he has the  urge due to pelvic floor muscles able to relax to let the urine flow    Time  8    Period  Weeks    Status  New    Target Date  08/13/17      PT LONG TERM GOAL #5   Title  pelvic floor strength increased to 3/5 due to ability to relax fully and contract the anal sphincter    Time  8    Period  Weeks    Status  New    Target Date  08/13/17            Plan - 07/02/17 1109    Clinical Impression Statement  Numbness and tingling in left leg is gone.  Pain is now intermitent than constant. After therapy, patient had no pain. Patient has decreased mobility of left hip anterior glide.  Patient will benefit from skilled therapy to improve mobility and strength while reducing pain.     Rehab Potential  Excellent    Clinical Impairments Affecting Rehab Potential  Depression, Anxiety, Gastroporesis, Migaines    PT Frequency  2x / week    PT Duration  8 weeks    PT Treatment/Interventions  Biofeedback;Cryotherapy;Electrical Stimulation;Moist Heat;Ultrasound;Therapeutic exercise;Therapeutic activities;Neuromuscular re-education;Patient/family education;Manual techniques;Dry needling    PT Next Visit Plan  work on pelvic floor; hip strength; squats    PT Home Exercise Plan  progress as needed    Recommended Other Services  MD signed initial eval.     Consulted and Agree with Plan of Care  Patient       Patient will benefit from skilled therapeutic intervention in order to improve the following deficits and impairments:  Increased fascial  restricitons, Pain, Increased muscle spasms, Postural dysfunction, Decreased activity tolerance, Decreased endurance, Decreased strength, Impaired flexibility  Visit Diagnosis: Muscle weakness (generalized)  Other muscle spasm  Unspecified lack of coordination     Problem List Patient Active Problem List   Diagnosis Date Noted  . Chronic migraine without aura without status migrainosus, not intractable 06/22/2017  . Vitamin D deficiency 05/28/2017   . Scrotal pain 05/28/2017  . Paroxysmal SVT (supraventricular tachycardia) ([place]) 05/28/2017  . Mild left inguinal hernia 11/23/2016  . Chronic pelvic pain in male 09/28/2016  . Abdominal pain, left lower quadrant 09/28/2016  . Isolated proteinuria without specific morphologic lesion 06/27/2016  . Left testicular pain 06/27/2016  . Dry skin dermatitis 06/27/2016  . Acute left flank pain 06/27/2016  . Pelvic floor dysfunction 04/01/2016  . GERD (gastroesophageal reflux disease) 10/13/2015  . Gastroparesis 10/13/2015  . Intractable chronic migraine without aura and without status migrainosus 12/15/2013  . Hyperlipidemia 04/04/2012  . Mitral valve disease 07/15/2009  . IRRITABLE BOWEL SYNDROME 07/15/2009  . Palpitations 06/28/2009    Earlie Counts, PT 07/02/17 11:50 AM   [facility] [address] [place], [name], [address] Phone: [phone]   Fax:  [phone]  Name: William [name] MRN: [id number] Date of Birth: 28-Dec-1960  PHYSICAL THERAPY DISCHARGE SUMMARY  Visits from Start of Care: 3  Current functional level related to goals / functional outcomes: See above.    Remaining deficits: See above. Patient has not returned to physical therapy due to family issues.    Education / Equipment: HEP Plan: Patient agrees to discharge.  Patient goals were not met. Patient is being discharged due to not returning since the last visit.  Thank you for the referral. Earlie Counts, PT 09/05/17 1:26 PM  ?????

## 2017-07-04 ENCOUNTER — Encounter: Payer: PPO | Admitting: Physical Therapy

## 2017-07-08 ENCOUNTER — Encounter: Payer: PPO | Admitting: Physical Therapy

## 2017-07-09 ENCOUNTER — Encounter: Payer: PPO | Admitting: Physical Therapy

## 2017-07-10 ENCOUNTER — Encounter: Payer: PPO | Admitting: Physical Therapy

## 2017-07-11 ENCOUNTER — Encounter: Payer: PPO | Admitting: Physical Therapy

## 2017-07-16 ENCOUNTER — Encounter: Payer: PPO | Admitting: Physical Therapy

## 2017-07-18 ENCOUNTER — Encounter: Payer: PPO | Admitting: Physical Therapy

## 2017-07-23 ENCOUNTER — Encounter: Payer: PPO | Admitting: Physical Therapy

## 2017-07-25 ENCOUNTER — Encounter: Payer: PPO | Admitting: Physical Therapy

## 2017-07-28 ENCOUNTER — Encounter: Payer: Self-pay | Admitting: Family Medicine

## 2017-08-21 ENCOUNTER — Encounter: Payer: Self-pay | Admitting: Family Medicine

## 2017-09-05 ENCOUNTER — Other Ambulatory Visit: Payer: Self-pay

## 2017-09-05 ENCOUNTER — Encounter: Payer: Self-pay | Admitting: Family Medicine

## 2017-09-05 ENCOUNTER — Ambulatory Visit (INDEPENDENT_AMBULATORY_CARE_PROVIDER_SITE_OTHER): Payer: PPO | Admitting: Family Medicine

## 2017-09-05 VITALS — BP 110/68 | HR 92 | Temp 98.1°F | Resp 18 | Ht 70.0 in | Wt 147.0 lb

## 2017-09-05 DIAGNOSIS — R102 Pelvic and perineal pain: Secondary | ICD-10-CM | POA: Diagnosis not present

## 2017-09-05 DIAGNOSIS — G8929 Other chronic pain: Secondary | ICD-10-CM | POA: Diagnosis not present

## 2017-09-05 DIAGNOSIS — G43809 Other migraine, not intractable, without status migrainosus: Secondary | ICD-10-CM | POA: Diagnosis not present

## 2017-09-05 DIAGNOSIS — R800 Isolated proteinuria: Secondary | ICD-10-CM

## 2017-09-05 LAB — POC MICROSCOPIC URINALYSIS (UMFC): Mucus: ABSENT

## 2017-09-05 LAB — POCT URINALYSIS DIP (MANUAL ENTRY)
Bilirubin, UA: NEGATIVE
GLUCOSE UA: NEGATIVE mg/dL
Ketones, POC UA: NEGATIVE mg/dL
Leukocytes, UA: NEGATIVE
Nitrite, UA: NEGATIVE
Protein Ur, POC: NEGATIVE mg/dL
SPEC GRAV UA: 1.01 (ref 1.010–1.025)
UROBILINOGEN UA: 0.2 U/dL
pH, UA: 6 (ref 5.0–8.0)

## 2017-09-05 MED ORDER — BUTALBITAL-ASPIRIN-CAFFEINE 50-325-40 MG PO CAPS: 1.0000 | ORAL_CAPSULE | ORAL | 1 refills | Status: DC | PRN

## 2017-09-05 MED ORDER — METOCLOPRAMIDE HCL 5 MG/5ML PO SOLN: 5.0000 mg | Freq: Three times a day (TID) | ORAL | 1 refills | Status: DC

## 2017-09-05 MED ORDER — CYCLOBENZAPRINE HCL 10 MG PO TABS: 10.0000 mg | ORAL_TABLET | Freq: Three times a day (TID) | ORAL | 3 refills | Status: DC | PRN

## 2017-09-05 NOTE — Progress Notes (Signed)
Subjective:    Patient ID: William [name], male    DOB: 1960/05/17, 57 y.o.   MRN: [id number] Chief Complaint  Patient presents with  . Migraine    Pt states migraines are worse. Pt states migraines are more intense not more freq  . Follow-up  . Medication Refill    Fiorinal, Flexeril 10 MG, Reglan 5 MG    HPI  Was [place] in 6 weeks w/ her BP spiking up to 180-200 so spend a lot of time in the ER.  Thought he was going to be able to bring her back right away but instead was there for 6 week by a surprise.  [name] has found that Dr. Angelena Form, cardiologist, going to see her. She is here now, and now greg is trying to convenience her to move here. His brother is in [place] so can visit. She has only been here for 2 weeks and seems to be doing better as she has more social outlet.   Migraines or HAs have been worse.   Past Medical History:  Diagnosis Date  . Allergy   . Anxiety   . Depression   . Gastroparesis   . Heart murmur   . Migraine headache   . Mitral valve prolapse    History reviewed. No pertinent surgical history. Current Outpatient Medications on File Prior to Visit  Medication Sig Dispense Refill  . diltiazem (CARDIZEM) 30 MG tablet Take 1 tablet (30 mg total) by mouth as needed. TAKE FOR PALPITATIONS 30 tablet 11  . fluticasone (FLONASE) 50 MCG/ACT nasal spray Place 2 sprays daily into both nostrils. 16 g 6  . ibuprofen (ADVIL,MOTRIN) 200 MG tablet Take by mouth.    . ondansetron (ZOFRAN) 4 MG tablet Take by mouth.    . promethazine (PHENERGAN) 12.5 MG tablet Take 1 tablet (12.5 mg total) by mouth every 8 (eight) hours as needed (migraine headache). 20 tablet 0  . triamcinolone (NASACORT) 55 MCG/ACT AERO nasal inhaler Place 2 sprays into the nose daily. 1 Inhaler 12   No current facility-administered medications on file prior to visit.    Allergies  Allergen Reactions  . Erythromycin Nausea And Vomiting and Anaphylaxis  . Hydrocodone-Acetaminophen Nausea And  Vomiting  . Prochlorperazine Edisylate     uncontrollable shake - tremor Other reaction(s): Other Shakes  . Tyloxapol Swelling  . Cephalexin Diarrhea and Nausea And Vomiting  . Cephalosporins Diarrhea and Nausea And Vomiting  . Ketoconazole Itching  . Ketorolac Nausea And Vomiting  . Latex Itching    REACTION: Reaction not known  . Nalbuphine Other (See Comments)    States loss of consciousness REACTION: Reaction not known  . Oxycodone-Acetaminophen Other (See Comments) and Swelling  . Prednisone Nausea And Vomiting  . Propranolol Diarrhea  . Restasis [Cyclosporine] Other (See Comments)    migraines  . Topamax [Topiramate] Diarrhea and Nausea And Vomiting  . Zetia [Ezetimibe] Diarrhea  . Cefdinir Diarrhea, Nausea And Vomiting and Rash  . Doxycycline Hyclate Nausea Only and Other (See Comments)    abd pain  . Neosporin [Neomycin-Bacitracin Zn-Polymyx] Rash   Family History  Problem Relation Age of Onset  . Hypertension Mother   . Hypertension Brother    Social History   Socioeconomic History  . Marital status: Married    Spouse name: Not on file  . Number of children: Not on file  . Years of education: Not on file  . Highest education level: Not on file  Occupational History  . Not  on file  Social Needs  . Financial resource strain: Not on file  . Food insecurity:    Worry: Not on file    Inability: Not on file  . Transportation needs:    Medical: Not on file    Non-medical: Not on file  Tobacco Use  . Smoking status: Never Smoker  . Smokeless tobacco: Never Used  Substance and Sexual Activity  . Alcohol use: No    Alcohol/week: 0.0 oz  . Drug use: No  . Sexual activity: Yes    Partners: Female  Lifestyle  . Physical activity:    Days per week: Not on file    Minutes per session: Not on file  . Stress: Not on file  Relationships  . Social connections:    Talks on phone: Not on file    Gets together: Not on file    Attends religious service: Not on  file    Active member of club or organization: Not on file    Attends meetings of clubs or organizations: Not on file    Relationship status: Not on file  Other Topics Concern  . Not on file  Social History Narrative  . Not on file   Depression screen [facility] 2/9 11/01/2017 10/16/2017 09/19/2017 09/05/2017 06/19/2017  Decreased Interest 0 0 0 0 0  Down, Depressed, Hopeless 0 0 0 0 0  PHQ - 2 Score 0 0 0 0 0  Altered sleeping - - - - -  Tired, decreased energy - - - - -  Change in appetite - - - - -  Feeling bad or failure about yourself  - - - - -  Trouble concentrating - - - - -  Moving slowly or fidgety/restless - - - - -  Suicidal thoughts - - - - -  PHQ-9 Score - - - - -  Some recent data might be hidden      Review of Systems  Constitutional: Positive for activity change, appetite change and fatigue. Negative for chills and fever.  Eyes: Negative for visual disturbance.  Respiratory: Negative for shortness of breath.   Cardiovascular: Negative for chest pain and leg swelling.  Genitourinary: Positive for flank pain. Negative for discharge and penile pain.  Neurological: Negative for dizziness, syncope, facial asymmetry, weakness, light-headedness and headaches.   See hpi    Objective:   Physical Exam  Constitutional: He is oriented to person, place, and time. He appears well-developed and well-nourished. No distress.  HENT:  Head: Normocephalic and atraumatic.  Eyes: Pupils are equal, round, and reactive to light. Conjunctivae are normal. No scleral icterus.  Neck: Normal range of motion. Neck supple. No thyromegaly present.  Cardiovascular: Normal rate, regular rhythm, normal heart sounds and intact distal pulses.  Pulmonary/Chest: Effort normal and breath sounds normal. No respiratory distress.  Abdominal: There is tenderness.  Musculoskeletal: He exhibits no edema.  Lymphadenopathy:    He has no cervical adenopathy.  Neurological: He is alert and oriented to person,  place, and time.  Skin: Skin is warm and dry. He is not diaphoretic.  Psychiatric: He has a normal mood and affect. His behavior is normal.          Assessment & Plan:   1. Isolated proteinuria without specific morphologic lesion   2. Other migraine without status migrainosus, not intractable   3. Chronic pelvic pain in male     Orders Placed This Encounter  Procedures  . Protein / Creatinine Ratio, Urine  . POCT  urinalysis dipstick  . POCT Microscopic Urinalysis ([facility])    Meds ordered this encounter  Medications  . metoCLOPramide (REGLAN) 5 MG/5ML solution    Sig: Take 5 mLs (5 mg total) by mouth 4 (four) times daily -  before meals and at bedtime.    Dispense:  600 mL    Refill:  1  . butalbital-aspirin-caffeine (FIORINAL) 50-325-40 MG capsule    Sig: Take 1 capsule by mouth every 4 (four) hours as needed for headache or migraine.    Dispense:  60 capsule    Refill:  1  . cyclobenzaprine (FLEXERIL) 10 MG tablet    Sig: Take 1 tablet (10 mg total) by mouth 3 (three) times daily as needed for muscle spasms.    Dispense:  90 tablet    Refill:  3    [name], M.D.  Primary Care at [facility] [address] [place], [place] [address] [phone] phone [phone] fax  11/20/17 4:46 AM

## 2017-09-05 NOTE — Patient Instructions (Addendum)
IF you received an x-ray today, you will receive an invoice from [facility]. Please contact [facility] at [phone] with questions or concerns regarding your invoice.   IF you received labwork today, you will receive an invoice from [facility]. Please contact [name] at [phone] with questions or concerns regarding your invoice.   Our billing staff will not be able to assist you with questions regarding bills from these companies.  You will be contacted with the lab results as soon as they are available. The fastest way to get your results is to activate your [name] account. Instructions are located on the last page of this paperwork. If you have not heard from [place] regarding the results in 2 weeks, please contact this office.     Analgesic Rebound Headache An analgesic rebound headache, sometimes called a medication overuse headache, is a headache that comes after pain medicine (analgesic) taken to treat the original (primary) headache has worn off. Any type of primary headache can return as a rebound headache if a person regularly takes analgesics more than three times a week to treat it. The types of primary headaches that are commonly associated with rebound headaches include:  Migraines.  Headaches that arise from tense muscles in the head and neck area (tension headaches).  Headaches that develop and happen again (recur) on one side of the head and around the eye (cluster headaches).  If rebound headaches continue, they become chronic daily headaches. What are the causes? This condition may be caused by frequent use of:  Over-the-counter medicines such as aspirin, ibuprofen, and acetaminophen.  Sinus relief medicines and other medicines that contain caffeine.  Narcotic pain medicines such as codeine and oxycodone.  What are the signs or symptoms? The symptoms of a rebound headache are the same as the symptoms of the original headache. Some of the  symptoms of specific types of headaches include: Migraine headache  Pulsing or throbbing pain on one or both sides of the head.  Severe pain that interferes with daily activities.  Pain that is worsened by physical activity.  Nausea, vomiting, or both.  Pain with exposure to bright light, loud noises, or strong smells.  General sensitivity to bright light, loud noises, or strong smells.  Visual changes.  Numbness of one or both arms. Tension headache  Pressure around the head.  Dull, aching head pain.  Pain felt over the front and sides of the head.  Tenderness in the muscles of the head, neck, and shoulders. Cluster headache  Severe pain that begins in or around one eye or temple.  Redness and tearing in the eye on the same side as the pain.  Droopy or swollen eyelid.  One-sided head pain.  Nausea.  Runny nose.  Sweaty, pale facial skin.  Restlessness. How is this diagnosed? This condition is diagnosed by:  Reviewing your medical history. This includes the nature of your primary headaches.  Reviewing the types of pain medicines that you have been using to treat your headaches and how often you take them.  How is this treated? This condition may be treated or managed by:  Discontinuing frequent use of the analgesic medicine. Doing this may worsen your headaches at first, but the pain should eventually become more manageable, less frequent, and less severe.  Seeing a headache specialist. He or she may be able to help you manage your headaches and help make sure there is not another cause of the headaches.  Using methods of stress  relief, such as acupuncture, counseling, biofeedback, and massage. Talk with your health care provider about which methods might be good for you.  Follow these instructions at home:  Take over-the-counter and prescription medicines only as told by your health care provider.  Stop the repeated use of pain medicine as told by your  health care provider. Stopping can be difficult. Carefully follow instructions from your health care provider.  Avoid triggers that are known to cause your primary headaches.  Keep all follow-up visits as told by your health care provider. This is important. Contact a health care provider if:  You continue to experience headaches after following treatments that your health care provider recommended. Get help right away if:  You develop new headache pain.  You develop headache pain that is different than what you have experienced in the past.  You develop numbness or tingling in your arms or legs.  You develop changes in your speech or vision. This information is not intended to replace advice given to you by your health care provider. Make sure you discuss any questions you have with your health care provider. Document Released: 07/07/2003 Document Revised: 11/04/2015 Document Reviewed: 09/19/2015 Elsevier Interactive Patient Education  [employer].

## 2017-09-06 ENCOUNTER — Ambulatory Visit: Payer: PPO | Admitting: Cardiovascular Disease

## 2017-09-06 ENCOUNTER — Encounter: Payer: Self-pay | Admitting: Cardiovascular Disease

## 2017-09-06 VITALS — BP 112/70 | HR 95 | Ht 70.0 in | Wt 149.0 lb

## 2017-09-06 DIAGNOSIS — I471 Supraventricular tachycardia: Secondary | ICD-10-CM | POA: Diagnosis not present

## 2017-09-06 LAB — PROTEIN / CREATININE RATIO, URINE
Creatinine, Urine: 101.8 mg/dL
Protein, Ur: 17.6 mg/dL
Protein/Creat Ratio: 173 mg/g creat (ref 0–200)

## 2017-09-06 MED ORDER — DILTIAZEM HCL ER COATED BEADS 120 MG PO CP24: 120.0000 mg | ORAL_CAPSULE | Freq: Every day | ORAL | 11 refills | Status: DC

## 2017-09-06 NOTE — Progress Notes (Signed)
Chief Complaint  Patient presents with  . Follow-up    SVT    History of Present Illness: 57 yo male with history of palpitations/PVCs, SVT, migraine headaches, mitral valve prolapse and irritable bowel syndrome who is here today for cardiac follow up. He has been known to have PVCs in the past but has not tolerated beta blockers. He had been on long acting Cardizem CD remotely. He had a normal echo and stress test in 2011. Holter monitor in 2011 with no evidence of SVT, atrial fibrillation. In 2014 he was admitted with a cough and chest pain and had a negative workup with no evidence of PE on Chest CTA and sinus tach on telemetry. Troponin was negative in the ED. He described sweating, coughing, fever, chills and pleuritic chest pain with cough. He had noticed his heart racing. I arranged a 48 hour monitor and an echo. Echo with normal LV size and function. 48 hour monitor with normal sinus rhythm and rare PACs. He was seen November 2018 in our office by Estella Husk, PA and had c/o palpitations. He had been seen in primary care with a URI and was felt to be dehydrated. He was given Diltiazem to take as needed. Cardiac event monitor November 2018 with sinus rhythm and one run of SVT (40 beats).  He ws started on propranolol for his SVT by primary care but it caused diarrhea. He was started on Zetia but had diarrhea. I restarted his Cardizem CD when I saw him in February 2019 but he stopped taking it. He is not sure why he stopped it. He does not recall dizziness.   He is here today for follow up. The patient denies any chest pain, dyspnea, lower extremity edema, orthopnea, PND, dizziness, near syncope or syncope. He has palpitations once every two weeks and they quickly resolve with short acting Dilitiazem.    Primary Care Physician: Shawnee Knapp, MD  Past Medical History:  Diagnosis Date  . Allergy   . Anxiety   . Depression   . Gastroparesis   . Heart murmur   . Migraine headache   .  Mitral valve prolapse    History reviewed. No pertinent surgical history.  Current Outpatient Medications  Medication Sig Dispense Refill  . butalbital-aspirin-caffeine (FIORINAL) 50-325-40 MG capsule Take 1 capsule by mouth every 4 (four) hours as needed for headache or migraine. 60 capsule 1  . cyclobenzaprine (FLEXERIL) 10 MG tablet Take 1 tablet (10 mg total) by mouth 3 (three) times daily as needed for muscle spasms. 90 tablet 3  . diltiazem (CARDIZEM) 30 MG tablet Take 1 tablet (30 mg total) by mouth as needed. TAKE FOR PALPITATIONS 30 tablet 11  . fluticasone (FLONASE) 50 MCG/ACT nasal spray Place 2 sprays daily into both nostrils. 16 g 6  . ibuprofen (ADVIL,MOTRIN) 200 MG tablet Take by mouth.    . metoCLOPramide (REGLAN) 5 MG/5ML solution Take 5 mLs (5 mg total) by mouth 4 (four) times daily -  before meals and at bedtime. 600 mL 1  . ondansetron (ZOFRAN) 4 MG tablet Take by mouth.    . promethazine (PHENERGAN) 12.5 MG tablet Take 1 tablet (12.5 mg total) by mouth every 8 (eight) hours as needed (migraine headache). 20 tablet 0  . triamcinolone (NASACORT) 55 MCG/ACT AERO nasal inhaler Place 2 sprays into the nose daily. 1 Inhaler 12  . diltiazem (CARDIZEM CD) 120 MG 24 hr capsule Take 1 capsule (120 mg total) by mouth daily.  30 capsule 11   No current facility-administered medications for this visit.     Allergies  Allergen Reactions  . Erythromycin Nausea And Vomiting and Anaphylaxis  . Hydrocodone-Acetaminophen Nausea And Vomiting  . Prochlorperazine Edisylate     uncontrollable shake - tremor Other reaction(s): Other Shakes  . Tyloxapol Swelling  . Cephalexin Diarrhea and Nausea And Vomiting  . Cephalosporins Diarrhea and Nausea And Vomiting  . Ketoconazole Itching  . Ketorolac Nausea And Vomiting  . Latex Itching    REACTION: Reaction not known  . Nalbuphine Other (See Comments)    States loss of consciousness REACTION: Reaction not known  . Oxycodone-Acetaminophen  Other (See Comments) and Swelling  . Prednisone Nausea And Vomiting  . Propranolol Diarrhea  . Restasis [Cyclosporine] Other (See Comments)    migraines  . Topamax [Topiramate] Diarrhea and Nausea And Vomiting  . Zetia [Ezetimibe] Diarrhea  . Cefdinir Diarrhea, Nausea And Vomiting and Rash  . Doxycycline Hyclate Nausea Only and Other (See Comments)    abd pain  . Neosporin [Neomycin-Bacitracin Zn-Polymyx] Rash    Social History   Socioeconomic History  . Marital status: Married    Spouse name: Not on file  . Number of children: Not on file  . Years of education: Not on file  . Highest education level: Not on file  Occupational History  . Not on file  Social Needs  . Financial resource strain: Not on file  . Food insecurity:    Worry: Not on file    Inability: Not on file  . Transportation needs:    Medical: Not on file    Non-medical: Not on file  Tobacco Use  . Smoking status: Never Smoker  . Smokeless tobacco: Never Used  Substance and Sexual Activity  . Alcohol use: No    Alcohol/week: 0.0 oz  . Drug use: No  . Sexual activity: Yes    Partners: Female  Lifestyle  . Physical activity:    Days per week: Not on file    Minutes per session: Not on file  . Stress: Not on file  Relationships  . Social connections:    Talks on phone: Not on file    Gets together: Not on file    Attends religious service: Not on file    Active member of club or organization: Not on file    Attends meetings of clubs or organizations: Not on file    Relationship status: Not on file  . Intimate partner violence:    Fear of current or ex partner: Not on file    Emotionally abused: Not on file    Physically abused: Not on file    Forced sexual activity: Not on file  Other Topics Concern  . Not on file  Social History Narrative  . Not on file    Family History  Problem Relation Age of Onset  . Hypertension Mother   . Hypertension Brother     Review of Systems:  As stated in  the HPI and otherwise negative.   BP 112/70   Pulse 95   Ht 5\' 10"  (1.778 m)   Wt 149 lb (67.6 kg)   SpO2 98%   BMI 21.38 kg/m   Physical Examination:  General: Well developed, well nourished, NAD  HEENT: OP clear, mucus membranes moist  SKIN: warm, dry. No rashes. Neuro: No focal deficits  Musculoskeletal: Muscle strength 5/5 all ext  Psychiatric: Mood and affect normal  Neck: No JVD, no carotid bruits, no  thyromegaly, no lymphadenopathy.  Lungs:Clear bilaterally, no wheezes, rhonci, crackles Cardiovascular: Regular rate and rhythm. No murmurs, gallops or rubs. Abdomen:Soft. Bowel sounds present. Non-tender.  Extremities: No lower extremity edema. Pulses are 2 + in the bilateral DP/PT.  Echo 07/10/12: Left ventricle: The cavity size was normal. Wall thickness was normal. Systolic function was normal. The estimated ejection fraction was in the range of 60% to 65%. Wall motion was normal; there were no regional wall motion abnormalities. Left ventricular diastolic function parameters were normal.   EKG:  EKG is not ordered today. The ekg ordered today demonstrates   Recent Labs: 09/28/2016: Platelets 281 12/29/2016: Hemoglobin 15.1; TSH 0.634 05/28/2017: ALT 13; BUN 10; Creatinine, Ser 0.89; Potassium 4.4; Sodium 140   Lipid Panel    Component Value Date/Time   CHOL 233 (H) 12/29/2016 1036   TRIG 157 (H) 12/29/2016 1036   HDL 52 12/29/2016 1036   CHOLHDL 4.5 12/29/2016 1036   CHOLHDL 3.7 05/14/2015 0958   VLDL 25 05/14/2015 0958   LDLCALC 150 (H) 12/29/2016 1036     Wt Readings from Last 3 Encounters:  09/06/17 149 lb (67.6 kg)  09/05/17 147 lb (66.7 kg)  06/19/17 146 lb 6.4 oz (66.4 kg)     Other studies Reviewed: Additional studies/ records that were reviewed today include: . Review of the above records demonstrates:   Assessment and Plan:   1. SVT: Event monitor in January 2019 showed one 40 beat episode of SVT. Cardizem CD was started at his office visit  in February 2019 but he stoppedit. He will need to be restarted on Cardizem CD 120 mg daily. He can use vagal maneuvers and short acting Diitiazem as needed for episodes of SVT. Ablation is always an option if his SVT is unable to be controlled with medications.    Current medicines are reviewed at length with the patient today.  The patient does not have concerns regarding medicines.  The following changes have been made:  Adding Cardizem CD  Labs/ tests ordered today include:  No orders of the defined types were placed in this encounter.    Disposition:   FU with me in 12  months   Signed, Lauree Chandler, MD 09/06/2017 10:45 AM    [facility] [address], [place], [place]  [address] Phone: [phone]; Fax: [phone]

## 2017-09-06 NOTE — Patient Instructions (Addendum)
Medication Instructions:  Your physician has recommended you make the following change in your medication: Resume Cardizem CD 120 mg by mouth daily   Labwork: none  Testing/Procedures: none  Follow-Up: Your physician recommends that you schedule a follow-up appointment in: about 12 months. Please call our office in about 8 months to schedule this appointment    Any Other Special Instructions Will Be Listed Below (If Applicable).     If you need a refill on your cardiac medications before your next appointment, please call your pharmacy.

## 2017-09-13 ENCOUNTER — Telehealth: Payer: Self-pay | Admitting: Family Medicine

## 2017-09-13 NOTE — Telephone Encounter (Signed)
Copied from [place] [id number]. Topic: Quick Communication - Rx Refill/Question >> Sep 13, 2017  2:22 PM Margot Ables wrote: Medication: RX received 09/05/17 [place] - pt cannot take aspirin - pharmacy states they called and then faxed request for change to [place] - they faxed 09/05/17, 09/09/17, 09/11/17 and have not had a response. Please advise. Preferred Pharmacy (with phone number or street name): [facility], [place]. [phone] (Phone) [phone] (Fax)

## 2017-09-18 MED ORDER — BUTALBITAL-APAP-CAFFEINE 50-325-40 MG PO TABS: 1.0000 | ORAL_TABLET | ORAL | 1 refills | Status: DC | PRN

## 2017-09-18 NOTE — Telephone Encounter (Signed)
Yes, that switch is fine. Sent in new rx.

## 2017-09-19 ENCOUNTER — Encounter: Payer: Self-pay | Admitting: Family Medicine

## 2017-09-19 ENCOUNTER — Other Ambulatory Visit: Payer: Self-pay

## 2017-09-19 ENCOUNTER — Ambulatory Visit (INDEPENDENT_AMBULATORY_CARE_PROVIDER_SITE_OTHER): Payer: PPO | Admitting: Family Medicine

## 2017-09-19 VITALS — BP 100/66 | HR 94 | Temp 97.8°F | Resp 18 | Ht 70.0 in | Wt 148.0 lb

## 2017-09-19 DIAGNOSIS — M549 Dorsalgia, unspecified: Secondary | ICD-10-CM | POA: Diagnosis not present

## 2017-09-19 DIAGNOSIS — R109 Unspecified abdominal pain: Secondary | ICD-10-CM | POA: Diagnosis not present

## 2017-09-19 LAB — POCT URINALYSIS DIP (MANUAL ENTRY)
Glucose, UA: NEGATIVE mg/dL
Leukocytes, UA: NEGATIVE
NITRITE UA: NEGATIVE
PH UA: 5.5 (ref 5.0–8.0)
PROTEIN UA: NEGATIVE mg/dL
UROBILINOGEN UA: 0.2 U/dL

## 2017-09-19 MED ORDER — METHOCARBAMOL 500 MG PO TABS: 500.0000 mg | ORAL_TABLET | Freq: Four times a day (QID) | ORAL | 1 refills | Status: DC

## 2017-09-19 NOTE — Progress Notes (Signed)
Subjective:  By signing my name below, I, William [name], attest that this documentation has been prepared under the direction and in the presence of [name], MD. Electronically Signed: Moises [name], [place]. 09/19/2017 , 5:44 PM .  Patient was seen in Room 3 .   Patient ID: William [name], male    DOB: [birth year], 57 y.o.   MRN: [id number] Chief Complaint  Patient presents with  . Back Pain    .x3 days, mid back pain, Pt states it hurt to straighten back. Pt states he is not having any urinary symptoms. Pt states he may have slept on the couch wrong.   HPI William [name] is a 57 y.o. male who presents to Primary Care at [facility] complaining of back pain which started about 3 days ago. Patient states he's been gradually having more pain across his back, locates left flank and paraspinal area of lumbar spine. He thought it came from sleeping on the couch 3 nights ago. He's been walking with a limp. When he comes down on his left foot when walking and bearing weight, he is able to reproduce the pain with shooting, tingling pain down into his left foot. He denies numbness, urinary symptoms, or stool symptoms. He denies leg pain or hip pain. He's been able to eat and drink okay. He tried applying heat over the area last night. He's been busy taking care of his wife and his mother.   Past Medical History:  Diagnosis Date  . Allergy   . Anxiety   . Depression   . Gastroparesis   . Heart murmur   . Migraine headache   . Mitral valve prolapse    History reviewed. No pertinent surgical history. Prior to Admission medications   Medication Sig Start Date End Date Taking? Authorizing Provider  butalbital-acetaminophen-caffeine (FIORICET, ESGIC) [phone] MG tablet Take 1 tablet by mouth every 4 (four) hours as needed for headache. 09/18/17   Shawnee Knapp, MD  cyclobenzaprine (FLEXERIL) 10 MG tablet Take 1 tablet (10 mg total) by mouth 3 (three) times daily as needed for muscle spasms.  09/05/17   Shawnee Knapp, MD  diltiazem (CARDIZEM CD) 120 MG 24 hr capsule Take 1 capsule (120 mg total) by mouth daily. 09/06/17   Burnell Blanks, MD  diltiazem (CARDIZEM) 30 MG tablet Take 1 tablet (30 mg total) by mouth as needed. TAKE FOR PALPITATIONS 03/26/17   Imogene Burn, PA-C  fluticasone [facility]) 50 MCG/ACT nasal spray Place 2 sprays daily into both nostrils. 03/18/17   Forrest Moron, MD  ibuprofen (ADVIL,MOTRIN) 200 MG tablet Take by mouth.    [provider]  metoCLOPramide (REGLAN) 5 MG/5ML solution Take 5 mLs (5 mg total) by mouth 4 (four) times daily -  before meals and at bedtime. 09/05/17   Shawnee Knapp, MD  ondansetron (ZOFRAN) 4 MG tablet Take by mouth.    [provider]  promethazine (PHENERGAN) 12.5 MG tablet Take 1 tablet (12.5 mg total) by mouth every 8 (eight) hours as needed (migraine headache). 05/28/17   Shawnee Knapp, MD  triamcinolone (NASACORT) 55 MCG/ACT AERO nasal inhaler Place 2 sprays into the nose daily. 04/26/17   Horald Pollen, MD   Allergies  Allergen Reactions  . Erythromycin Nausea And Vomiting and Anaphylaxis  . Hydrocodone-Acetaminophen Nausea And Vomiting  . Prochlorperazine Edisylate     uncontrollable shake - tremor Other reaction(s): Other Shakes  . Tyloxapol Swelling  . Cephalexin Diarrhea and Nausea  And Vomiting  . Cephalosporins Diarrhea and Nausea And Vomiting  . Ketoconazole Itching  . Ketorolac Nausea And Vomiting  . Latex Itching    REACTION: Reaction not known  . Nalbuphine Other (See Comments)    States loss of consciousness REACTION: Reaction not known  . Oxycodone-Acetaminophen Other (See Comments) and Swelling  . Prednisone Nausea And Vomiting  . Propranolol Diarrhea  . Restasis [Cyclosporine] Other (See Comments)    migraines  . Topamax [Topiramate] Diarrhea and Nausea And Vomiting  . Zetia [Ezetimibe] Diarrhea  . Cefdinir Diarrhea, Nausea And Vomiting and Rash  . Doxycycline Hyclate Nausea  Only and Other (See Comments)    abd pain  . Neosporin [Neomycin-Bacitracin Zn-Polymyx] Rash   Family History  Problem Relation Age of Onset  . Hypertension Mother   . Hypertension Brother    Social History   Socioeconomic History  . Marital status: Married    Spouse name: Not on file  . Number of children: Not on file  . Years of education: Not on file  . Highest education level: Not on file  Occupational History  . Not on file  Social Needs  . Financial resource strain: Not on file  . Food insecurity:    Worry: Not on file    Inability: Not on file  . Transportation needs:    Medical: Not on file    Non-medical: Not on file  Tobacco Use  . Smoking status: Never Smoker  . Smokeless tobacco: Never Used  Substance and Sexual Activity  . Alcohol use: No    Alcohol/week: 0.0 oz  . Drug use: No  . Sexual activity: Yes    Partners: Female  Lifestyle  . Physical activity:    Days per week: Not on file    Minutes per session: Not on file  . Stress: Not on file  Relationships  . Social connections:    Talks on phone: Not on file    Gets together: Not on file    Attends religious service: Not on file    Active member of club or organization: Not on file    Attends meetings of clubs or organizations: Not on file    Relationship status: Not on file  Other Topics Concern  . Not on file  Social History Narrative  . Not on file   Depression screen [facility] 2/9 09/19/2017 09/05/2017 06/19/2017 06/15/2017 05/28/2017  Decreased Interest 0 0 0 0 0  Down, Depressed, Hopeless 0 0 0 0 0  PHQ - 2 Score 0 0 0 0 0  Altered sleeping - - - - -  Tired, decreased energy - - - - -  Change in appetite - - - - -  Feeling bad or failure about yourself  - - - - -  Trouble concentrating - - - - -  Moving slowly or fidgety/restless - - - - -  Suicidal thoughts - - - - -  PHQ-9 Score - - - - -  Some recent data might be hidden    Review of Systems  Constitutional: Negative for fatigue and  unexpected weight change.  Eyes: Negative for visual disturbance.  Respiratory: Negative for cough, chest tightness and shortness of breath.   Cardiovascular: Negative for chest pain, palpitations and leg swelling.  Gastrointestinal: Negative for abdominal pain, [name] in stool, constipation, diarrhea, nausea and vomiting.  Genitourinary: Negative for dysuria, frequency, hematuria and urgency.  Musculoskeletal: Positive for back pain.  Neurological: Negative for dizziness, weakness, light-headedness, numbness and  headaches.       Objective:   Physical Exam  Constitutional: He is oriented to person, place, and time. He appears well-developed and well-nourished. No distress.  HENT:  Head: Normocephalic and atraumatic.  Eyes: Pupils are equal, round, and reactive to light. EOM are normal.  Neck: Neck supple.  Cardiovascular: Normal rate.  Pulmonary/Chest: Effort normal. No respiratory distress.  Abdominal: There is no CVA tenderness.  Musculoskeletal: Normal range of motion.  Lower extremities strength 5/5, positive seated straight leg raise bilaterally; muscle tightness and spasms all through the left flank  Neurological: He is alert and oriented to person, place, and time.  Skin: Skin is warm and dry.  Psychiatric: He has a normal mood and affect. His behavior is normal.  Nursing note and vitals reviewed.   BP 100/66 (BP Location: Left Arm, Patient Position: Sitting, Cuff Size: Normal)   Pulse 94   Temp 97.8 F (36.6 C) (Oral)   Resp 18   Ht 5\' 10"  (1.778 m)   Wt 148 lb (67.1 kg)   SpO2 98%   BMI 21.24 kg/m   Results for orders placed or performed in visit on 09/19/17  POCT urinalysis dipstick  Result Value Ref Range   Color, UA yellow yellow   Clarity, UA clear clear   Glucose, UA negative negative mg/dL   Bilirubin, UA small (A) negative   Ketones, POC UA trace (5) (A) negative mg/dL   Spec Grav, UA >=1.030 (A) 1.010 - 1.025   [name], UA moderate (A) negative   pH, UA  5.5 5.0 - 8.0   Protein Ur, POC negative negative mg/dL   Urobilinogen, UA 0.2 0.2 or 1.0 E.U./dL   Nitrite, UA Negative Negative   Leukocytes, UA Negative Negative       Assessment & Plan:   1. Back pain, unspecified back location, unspecified back pain laterality, unspecified chronicity   2. Acute left flank pain     Orders Placed This Encounter  Procedures  . POCT urinalysis dipstick    Meds ordered this encounter  Medications  . methocarbamol (ROBAXIN) 500 MG tablet    Sig: Take 1 tablet (500 mg total) by mouth 4 (four) times daily. For muscle spasm    Dispense:  60 tablet    Refill:  1    I personally performed the services described in this documentation, which was scribed in my presence. The recorded information has been reviewed and considered, and addended by me as needed.   [name], M.D.  Primary Care at [facility] [address] [place],  [address] [phone] phone [phone] fax  11/20/17 4:49 AM

## 2017-09-19 NOTE — Patient Instructions (Addendum)
Make sure you are taking a muscle relaxant methocarbamol and applying heat 4 times a day for 15 minutes for the next 3 days followed by gentle stretching.    IF you received an x-ray today, you will receive an invoice from [facility]. Please contact [facility] at [phone] with questions or concerns regarding your invoice.   IF you received labwork today, you will receive an invoice from [facility]. Please contact [name] at [phone] with questions or concerns regarding your invoice.   Our billing staff will not be able to assist you with questions regarding bills from these companies.  You will be contacted with the lab results as soon as they are available. The fastest way to get your results is to activate your [name] account. Instructions are located on the last page of this paperwork. If you have not heard from [place] regarding the results in 2 weeks, please contact this office.     Low Back Strain A strain is a stretch or tear in a muscle or the strong cords of tissue that attach muscle to bone (tendons). Strains of the lower back (lumbar spine) are a common cause of low back pain. A strain occurs when muscles or tendons are torn or are stretched beyond their limits. The muscles may become inflamed, resulting in pain and sudden muscle tightening (spasms). A strain can happen suddenly due to an injury (trauma), or it can develop gradually due to overuse. There are three types of strains:  Grade 1 is a mild strain involving a minor tear of the muscle fibers or tendons. This may cause some pain but no loss of muscle strength.  Grade 2 is a moderate strain involving a partial tear of the muscle fibers or tendons. This causes more severe pain and some loss of muscle strength.  Grade 3 is a severe strain involving a complete tear of the muscle or tendon. This causes severe pain and complete or nearly complete loss of muscle strength.  What are the causes? This  condition may be caused by:  Trauma, such as a fall or a hit to the body.  Twisting or overstretching the back. This may result from doing activities that require a lot of energy, such as lifting heavy objects.  What increases the risk? The following factors may increase your risk of getting this condition:  Playing contact sports.  Participating in sports or activities that put excessive stress on the back and require a lot of bending and twisting, including: ? Lifting weights or heavy objects. ? Gymnastics. ? Soccer. ? Figure skating. ? Snowboarding.  Being overweight or obese.  Having poor strength and flexibility.  What are the signs or symptoms? Symptoms of this condition may include:  Sharp or dull pain in the lower back that does not go away. Pain may extend to the buttocks.  Stiffness.  Limited range of motion.  Inability to stand up straight due to stiffness or pain.  Muscle spasms.  How is this diagnosed? This condition may be diagnosed based on:  Your symptoms.  Your medical history.  A physical exam. ? Your health care provider may push on certain areas of your back to determine the source of your pain. ? You may be asked to bend forward, backward, and side to side to assess the severity of your pain and your range of motion.  Imaging tests, such as: ? X-rays. ? MRI.  How is this treated? Treatment for this condition may include:  Applying heat  and cold to the affected area.  Medicines to help relieve pain and to relax your muscles (muscle relaxants).  NSAIDs to help reduce swelling and discomfort.  Physical therapy.  When your symptoms improve, it is important to gradually return to your normal routine as soon as possible to reduce pain, avoid stiffness, and avoid loss of muscle strength. Generally, symptoms should improve within 6 weeks of treatment. However, recovery time varies. Follow these instructions at home: Managing pain, stiffness,  and swelling  If directed, apply ice to the injured area during the first 24 hours after your injury. ? Put ice in a plastic bag. ? Place a towel between your skin and the bag. ? Leave the ice on for 20 minutes, 2-3 times a day.  If directed, apply heat to the affected area as often as told by your health care provider. Use the heat source that your health care provider recommends, such as a moist heat pack or a heating pad. ? Place a towel between your skin and the heat source. ? Leave the heat on for 20-30 minutes. ? Remove the heat if your skin turns bright red. This is especially important if you are unable to feel pain, heat, or cold. You may have a greater risk of getting burned. Activity  Rest and return to your normal activities as told by your health care provider. Ask your health care provider what activities are safe for you.  Avoid activities that take a lot of effort (are strenuous) for as long as told by your health care provider.  Do exercises as told by your health care provider. General instructions   Take over-the-counter and prescription medicines only as told by your health care provider.  If you have questions or concerns about safety while taking pain medicine, talk with your health care provider.  Do not drive or operate heavy machinery until you know how your pain medicine affects you.  Do not use any tobacco products, such as cigarettes, chewing tobacco, and e-cigarettes. Tobacco can delay bone healing. If you need help quitting, ask your health care provider.  Keep all follow-up visits as told by your health care provider. This is important. How is this prevented?  Warm up and stretch before being active.  Cool down and stretch after being active.  Give your body time to rest between periods of activity.  Avoid: ? Being physically inactive for long periods at a time. ? Exercising or playing sports when you are tired or in pain.  Use correct form  when playing sports and lifting heavy objects.  Use good posture when sitting and standing.  Maintain a healthy weight.  Sleep on a mattress with medium firmness to support your back.  Make sure to use equipment that fits you, including shoes that fit well.  Be safe and responsible while being active to avoid falls.  Do at least 150 minutes of moderate-intensity exercise each week, such as brisk walking or water aerobics. Try a form of exercise that takes stress off your back, such as swimming or stationary cycling.  Maintain physical fitness, including: ? Strength. ? Flexibility. ? Cardiovascular fitness. ? Endurance. Contact a health care provider if:  Your back pain does not improve after 6 weeks of treatment.  Your symptoms get worse. Get help right away if:  Your back pain is severe.  You are unable to stand or walk.  You develop pain in your legs.  You develop weakness in your buttocks or legs.  You have difficulty controlling when you urinate or when you have a bowel movement. This information is not intended to replace advice given to you by your health care provider. Make sure you discuss any questions you have with your health care provider. Document Released: 04/16/2005 Document Revised: 12/22/2015 Document Reviewed: 01/26/2015 Elsevier Interactive Patient Education  2017 [facility] Ask your health care provider which exercises are safe for you. Do exercises exactly as told by your health care provider and adjust them as directed. It is normal to feel mild stretching, pulling, tightness, or discomfort as you do these exercises, but you should stop right away if you feel sudden pain or your pain gets worse. Do not begin these exercises until told by your health care provider. Stretching and range of motion exercises These exercises warm up your muscles and joints and improve the movement and flexibility of your back. These exercises also  help to relieve pain, numbness, and tingling. Exercise A: Single knee to chest  1. Lie on your back on a firm surface with both legs straight. 2. Bend one of your knees. Use your hands to move your knee up toward your chest until you feel a gentle stretch in your lower back and buttock. ? Hold your leg in this position by holding onto the front of your knee. ? Keep your other leg as straight as possible. 3. Hold for __________ seconds. 4. Slowly return to the starting position. 5. Repeat with your other leg. Repeat __________ times. Complete this exercise __________ times a day. Exercise B: Prone extension on elbows  1. Lie on your abdomen on a firm surface. 2. Prop yourself up on your elbows. 3. Use your arms to help lift your chest up until you feel a gentle stretch in your abdomen and your lower back. ? This will place some of your body weight on your elbows. If this is uncomfortable, try stacking pillows under your chest. ? Your hips should stay down, against the surface that you are lying on. Keep your hip and back muscles relaxed. 4. Hold for __________ seconds. 5. Slowly relax your upper body and return to the starting position. Repeat __________ times. Complete this exercise __________ times a day. Strengthening exercises These exercises build strength and endurance in your back. Endurance is the ability to use your muscles for a long time, even after they get tired. Exercise C: Pelvic tilt 1. Lie on your back on a firm surface. Bend your knees and keep your feet flat. 2. Tense your abdominal muscles. Tip your pelvis up toward the ceiling and flatten your lower back into the floor. ? To help with this exercise, you may place a small towel under your lower back and try to push your back into the towel. 3. Hold for __________ seconds. 4. Let your muscles relax completely before you repeat this exercise. Repeat __________ times. Complete this exercise __________ times a  day. Exercise D: Alternating arm and leg raises  1. Get on your hands and knees on a firm surface. If you are on a hard floor, you may want to use padding to cushion your knees, such as an exercise mat. 2. Line up your arms and legs. Your hands should be below your shoulders, and your knees should be below your hips. 3. Lift your left leg behind you. At the same time, raise your right arm and straighten it in front of you. ? Do not lift your leg higher than  your hip. ? Do not lift your arm higher than your shoulder. ? Keep your abdominal and back muscles tight. ? Keep your hips facing the ground. ? Do not arch your back. ? Keep your balance carefully, and do not hold your breath. 4. Hold for __________ seconds. 5. Slowly return to the starting position and repeat with your right leg and your left arm. Repeat __________ times. Complete this exercise __________times a day. Exercise J: Single leg lower with bent knees 1. Lie on your back on a firm surface. 2. Tense your abdominal muscles and lift your feet off the floor, one foot at a time, so your knees and hips are bent in an "L" shape (at about 90 degrees). ? Your knees should be over your hips and your lower legs should be parallel to the floor. 3. Keeping your abdominal muscles tense and your knee bent, slowly lower one of your legs so your toe touches the ground. 4. Lift your leg back up to return to the starting position. ? Do not hold your breath. ? Do not let your back arch. Keep your back flat against the ground. 5. Repeat with your other leg. Repeat __________ times. Complete this exercise __________ times a day. Posture and body mechanics  Body mechanics refers to the movements and positions of your body while you do your daily activities. Posture is part of body mechanics. Good posture and healthy body mechanics can help to relieve stress in your body's tissues and joints. Good posture means that your spine is in its natural  S-curve position (your spine is neutral), your shoulders are pulled back slightly, and your head is not tipped forward. The following are general guidelines for applying improved posture and body mechanics to your everyday activities. Standing   When standing, keep your spine neutral and your feet about hip-width apart. Keep a slight bend in your knees. Your ears, shoulders, and hips should line up.  When you do a task in which you stand in one place for a long time, place one foot up on a stable object that is 2-4 inches (5-10 cm) high, such as a footstool. This helps keep your spine neutral. Sitting   When sitting, keep your spine neutral and keep your feet flat on the floor. Use a footrest, if necessary, and keep your thighs parallel to the floor. Avoid rounding your shoulders, and avoid tilting your head forward.  When working at a desk or a computer, keep your desk at a height where your hands are slightly lower than your elbows. Slide your chair under your desk so you are close enough to maintain good posture.  When working at a computer, place your monitor at a height where you are looking straight ahead and you do not have to tilt your head forward or downward to look at the screen. Resting   When lying down and resting, avoid positions that are most painful for you.  If you have pain with activities such as sitting, bending, stooping, or squatting (flexion-based activities), lie in a position in which your body does not bend very much. For example, avoid curling up on your side with your arms and knees near your chest (fetal position).  If you have pain with activities such as standing for a long time or reaching with your arms (extension-based activities), lie with your spine in a neutral position and bend your knees slightly. Try the following positions: ? Lying on your side with a pillow between your  knees. ? Lying on your back with a pillow under your knees. Lifting   When  lifting objects, keep your feet at least shoulder-width apart and tighten your abdominal muscles.  Bend your knees and hips and keep your spine neutral. It is important to lift using the strength of your legs, not your back. Do not lock your knees straight out.  Always ask for help to lift heavy or awkward objects. This information is not intended to replace advice given to you by your health care provider. Make sure you discuss any questions you have with your health care provider. Document Released: 04/16/2005 Document Revised: 12/22/2015 Document Reviewed: 01/26/2015 Elsevier Interactive Patient Education  [employer].

## 2017-10-09 NOTE — Progress Notes (Signed)
[name] [address], [place] [address] Phone: [phone] Subjective:    I'm seeing this patient by the request  of:  Shawnee Knapp, MD   CC: Back pain  [id number]  William [name] is a 57 y.o. male coming in with complaint of left side of his body. Pain in his back that radiates down the left side and posterior of leg to the heel on the left foot. Pain is constant. Patient carried a Landscape architect of water 2 weeks ago. He was in therapy for pelvic floor dysfunction. Physical therapy was helping. Pain is burning sensation at a 5/10 today.        Past Medical History:  Diagnosis Date  . Allergy   . Anxiety   . Depression   . Gastroparesis   . Heart murmur   . Migraine headache   . Mitral valve prolapse    No past surgical history on file. Social History   Socioeconomic History  . Marital status: Married    Spouse name: Not on file  . Number of children: Not on file  . Years of education: Not on file  . Highest education level: Not on file  Occupational History  . Not on file  Social Needs  . Financial resource strain: Not on file  . Food insecurity:    Worry: Not on file    Inability: Not on file  . Transportation needs:    Medical: Not on file    Non-medical: Not on file  Tobacco Use  . Smoking status: Never Smoker  . Smokeless tobacco: Never Used  Substance and Sexual Activity  . Alcohol use: No    Alcohol/week: 0.0 oz  . Drug use: No  . Sexual activity: Yes    Partners: Female  Lifestyle  . Physical activity:    Days per week: Not on file    Minutes per session: Not on file  . Stress: Not on file  Relationships  . Social connections:    Talks on phone: Not on file    Gets together: Not on file    Attends religious service: Not on file    Active member of club or organization: Not on file    Attends meetings of clubs or organizations: Not on file    Relationship status: Not on file  Other Topics  Concern  . Not on file  Social History Narrative  . Not on file   Allergies  Allergen Reactions  . Erythromycin Nausea And Vomiting and Anaphylaxis  . Hydrocodone-Acetaminophen Nausea And Vomiting  . Prochlorperazine Edisylate     uncontrollable shake - tremor Other reaction(s): Other Shakes  . Tyloxapol Swelling  . Cephalexin Diarrhea and Nausea And Vomiting  . Cephalosporins Diarrhea and Nausea And Vomiting  . Ketoconazole Itching  . Ketorolac Nausea And Vomiting  . Latex Itching    REACTION: Reaction not known  . Nalbuphine Other (See Comments)    States loss of consciousness REACTION: Reaction not known  . Oxycodone-Acetaminophen Other (See Comments) and Swelling  . Prednisone Nausea And Vomiting  . Propranolol Diarrhea  . Restasis [Cyclosporine] Other (See Comments)    migraines  . Topamax [Topiramate] Diarrhea and Nausea And Vomiting  . Zetia [Ezetimibe] Diarrhea  . Cefdinir Diarrhea, Nausea And Vomiting and Rash  . Doxycycline Hyclate Nausea Only and Other (See Comments)    abd pain  . Neosporin [Neomycin-Bacitracin Zn-Polymyx] Rash   Family History  Problem Relation Age of  Onset  . Hypertension Mother   . Hypertension Brother      Past medical history, social, surgical and family history all reviewed in electronic medical record.  No pertanent information unless stated regarding to the chief complaint.   Review of Systems:Review of systems updated and as accurate as of 10/10/17  No headache, visual changes, nausea, vomiting, diarrhea, constipation, dizziness, abdominal pain, skin rash, fevers, chills, night sweats, weight loss, swollen lymph nodes, body aches, joint swelling,  chest pain, shortness of breath, mood changes.  Positive muscle aches  Objective  Blood pressure 120/76, pulse (!) 103, height 5\' 10"  (1.778 m), weight 148 lb (67.1 kg), SpO2 98 %. Systems examined below as of 10/10/17   General: No apparent distress alert and oriented x3 mood and  affect normal, dressed appropriately.  HEENT: Pupils equal, extraocular movements intact  Respiratory: Patient's speak in full sentences and does not appear short of breath  Cardiovascular: No lower extremity edema, non tender, no erythema  Skin: Warm dry intact with no signs of infection or rash on extremities or on axial skeleton.  Abdomen: Soft nontender  Neuro: Cranial nerves II through XII are intact, neurovascularly intact in all extremities withand 2+ pulses.  Lymph: No lymphadenopathy of posterior or anterior cervical chain or axillae bilaterally.  Gait cautious MSK:  Non tender with full range of motion and good stability and symmetric strength and tone of shoulders, elbows, wrist, hip, knee and ankles bilaterally.  Back Exam:  Inspection: Loss of lordosis Motion: Flexion 25 deg, Extension 25 deg, Side Bending to 45 deg bilaterally,  Rotation to 45 deg bilaterally  SLR laying: Positive left side at 20 degrees XSLR laying: Positive with radicular signs on the left side Palpable tenderness: Tender to palpation the paraspinal musculature lumbar spine right greater than left. FABER: 32 discomfort. Sensory change: Gross sensation intact to all lumbar and sacral dermatomes.  Reflexes: 2+ at both patellar tendons, 1+ on the left compared to the right at the Achilles Full strength noted of the ankles bilaterally.   Impression and Recommendations:     This case required medical decision making of moderate complexity.      Note: This dictation was prepared with Dragon dictation along with smaller phrase technology. Any transcriptional errors that result from this process are unintentional.

## 2017-10-10 ENCOUNTER — Ambulatory Visit (INDEPENDENT_AMBULATORY_CARE_PROVIDER_SITE_OTHER)
Admission: RE | Admit: 2017-10-10 | Discharge: 2017-10-10 | Disposition: A | Discharge status: Home / Self Care | Payer: PPO | Source: Ambulatory Visit | Attending: Family Medicine | Admitting: Family Medicine

## 2017-10-10 ENCOUNTER — Other Ambulatory Visit: Payer: PPO

## 2017-10-10 ENCOUNTER — Ambulatory Visit (INDEPENDENT_AMBULATORY_CARE_PROVIDER_SITE_OTHER): Payer: PPO | Admitting: Family Medicine

## 2017-10-10 VITALS — BP 120/76 | HR 103 | Ht 70.0 in | Wt 148.0 lb

## 2017-10-10 DIAGNOSIS — M545 Low back pain, unspecified: Secondary | ICD-10-CM

## 2017-10-10 DIAGNOSIS — M5416 Radiculopathy, lumbar region: Secondary | ICD-10-CM

## 2017-10-10 MED ORDER — TRAMADOL HCL 50 MG PO TABS: 50.0000 mg | ORAL_TABLET | Freq: Three times a day (TID) | ORAL | 0 refills | Status: DC | PRN

## 2017-10-10 MED ORDER — PREDNISONE 50 MG PO TABS
50.0000 mg | ORAL_TABLET | Freq: Every day | ORAL | 0 refills | Status: DC
Start: 2017-10-10 — End: 2017-10-17

## 2017-10-10 MED ORDER — GABAPENTIN 100 MG PO CAPS: 200.0000 mg | ORAL_CAPSULE | Freq: Every day | ORAL | 3 refills | Status: DC

## 2017-10-10 NOTE — Assessment & Plan Note (Signed)
Lumbar radiculopathy.  Discussed with patient at great length.  Discussed icing regimen, home exercise, prednisone, gabapentin and tramadol given for breakthrough pain.  X-rays pending.  worsening pain go to ER patient will follow-up in 1 week

## 2017-10-10 NOTE — Patient Instructions (Addendum)
Good to see you  I am sorry you are hurting.  Prednisone daily for 5 days Gabapentin 200mg  at night Tramadol 50 mg up to 2 times a day with 500mg  of tylenol.  See me again in 1 week (ok to double book)

## 2017-10-11 ENCOUNTER — Ambulatory Visit: Payer: PPO | Admitting: Family Medicine

## 2017-10-16 ENCOUNTER — Ambulatory Visit (INDEPENDENT_AMBULATORY_CARE_PROVIDER_SITE_OTHER): Payer: PPO | Admitting: Family Medicine

## 2017-10-16 ENCOUNTER — Ambulatory Visit: Payer: PPO | Admitting: Family Medicine

## 2017-10-16 ENCOUNTER — Encounter: Payer: Self-pay | Admitting: Family Medicine

## 2017-10-16 VITALS — BP 112/76 | HR 81 | Temp 97.9°F | Ht 70.0 in | Wt 150.6 lb

## 2017-10-16 DIAGNOSIS — I471 Supraventricular tachycardia: Secondary | ICD-10-CM

## 2017-10-16 DIAGNOSIS — G43009 Migraine without aura, not intractable, without status migrainosus: Secondary | ICD-10-CM | POA: Diagnosis not present

## 2017-10-16 DIAGNOSIS — M5416 Radiculopathy, lumbar region: Secondary | ICD-10-CM | POA: Diagnosis not present

## 2017-10-16 DIAGNOSIS — K3184 Gastroparesis: Secondary | ICD-10-CM

## 2017-10-16 NOTE — Assessment & Plan Note (Signed)
Followed by Dr. Tamala Julian, has follow up tomorrow.  Some improvement with recent medications.

## 2017-10-16 NOTE — Assessment & Plan Note (Signed)
Stable at this time with current medications Recommend continuation He'll continue to follow with cardiology as well.

## 2017-10-16 NOTE — Progress Notes (Signed)
[name] [place], [place] [address] Phone: [phone] Subjective:      CC: Back pain  [id number]  William [name] is a 57 y.o. male coming in with complaint of back pain. He has improved from last week. Does feel that he is 50% better but still has constant tingling in left foot. Leg feels weak.  Patient states that he is able to walk but if he tries to run he feels like he is going to trip sometimes.  States that sometimes it is uncomfortable to find a position at night.  Patient describes the pain as an aching sensation most of the time.  Denies any bowel or bladder incontinence.    Past Medical History:  Diagnosis Date  . Allergy   . Anxiety   . Depression   . Gastroparesis   . Heart murmur   . Migraine headache   . Mitral valve prolapse    No past surgical history on file. Social History   Socioeconomic History  . Marital status: Married    Spouse name: Not on file  . Number of children: Not on file  . Years of education: Not on file  . Highest education level: Not on file  Occupational History  . Not on file  Social Needs  . Financial resource strain: Not on file  . Food insecurity:    Worry: Not on file    Inability: Not on file  . Transportation needs:    Medical: Not on file    Non-medical: Not on file  Tobacco Use  . Smoking status: Never Smoker  . Smokeless tobacco: Never Used  Substance and Sexual Activity  . Alcohol use: No    Alcohol/week: 0.0 oz  . Drug use: No  . Sexual activity: Yes    Partners: Female  Lifestyle  . Physical activity:    Days per week: Not on file    Minutes per session: Not on file  . Stress: Not on file  Relationships  . Social connections:    Talks on phone: Not on file    Gets together: Not on file    Attends religious service: Not on file    Active member of club or organization: Not on file    Attends meetings of clubs or organizations: Not on file   Relationship status: Not on file  Other Topics Concern  . Not on file  Social History Narrative  . Not on file   Allergies  Allergen Reactions  . Erythromycin Nausea And Vomiting and Anaphylaxis  . Hydrocodone-Acetaminophen Nausea And Vomiting  . Prochlorperazine Edisylate     uncontrollable shake - tremor Other reaction(s): Other Shakes  . Tyloxapol Swelling  . Cephalexin Diarrhea and Nausea And Vomiting  . Cephalosporins Diarrhea and Nausea And Vomiting  . Ketoconazole Itching  . Ketorolac Nausea And Vomiting  . Latex Itching    REACTION: Reaction not known  . Nalbuphine Other (See Comments)    States loss of consciousness REACTION: Reaction not known  . Oxycodone-Acetaminophen Other (See Comments) and Swelling  . Prednisone Nausea And Vomiting  . Propranolol Diarrhea  . Restasis [Cyclosporine] Other (See Comments)    migraines  . Topamax [Topiramate] Diarrhea and Nausea And Vomiting  . Zetia [Ezetimibe] Diarrhea  . Cefdinir Diarrhea, Nausea And Vomiting and Rash  . Doxycycline Hyclate Nausea Only and Other (See Comments)    abd pain  . Neosporin [Neomycin-Bacitracin Zn-Polymyx] Rash   Family History  Problem Relation Age of Onset  . Hypertension Mother   . Hypertension Brother      Past medical history, social, surgical and family history all reviewed in electronic medical record.  No pertanent information unless stated regarding to the chief complaint.   Review of Systems:Review of systems updated and as accurate as of 10/17/17  No headache, visual changes, nausea, vomiting, diarrhea, constipation, dizziness, abdominal pain, skin rash, fevers, chills, night sweats, weight loss, swollen lymph nodes, body aches, joint swelling,  chest pain, shortness of breath, mood changes.  Positive muscle aches  Objective  Blood pressure 122/74, pulse 85, height 5\' 10"  (1.778 m), weight 151 lb (68.5 kg), SpO2 98 %. Systems examined below as of 10/17/17   General: No apparent  distress alert and oriented x3 mood and affect normal, dressed appropriately.  HEENT: Pupils equal, extraocular movements intact  Respiratory: Patient's speak in full sentences and does not appear short of breath  Cardiovascular: No lower extremity edema, non tender, no erythema  Skin: Warm dry intact with no signs of infection or rash on extremities or on axial skeleton.  Abdomen: Soft nontender  Neuro: Cranial nerves II through XII are intact, neurovascularly intact in all extremities with 2+ pulses.  Lymph: No lymphadenopathy of posterior or anterior cervical chain or axillae bilaterally.  Gait normal with good balance and coordination.  MSK:  Non tender with full range of motion and good stability and symmetric strength and tone of shoulders, elbows, wrist, hip, knee and ankles bilaterally.  Back Exam:  Inspection: Unremarkable  Motion: Flexion 35 deg with worsening symptoms, Extension 25 deg, Side Bending to 30 deg bilaterally,  Rotation to 35 deg bilaterally  SLR laying: Positive left side XSLR laying: Negative  Palpable tenderness: Tender to palpation of the paraspinal musculature lumbar spine L5-S1 left side. FABER: Positive left. Sensory change: Gross sensation intact to all lumbar and sacral dermatomes.  Reflexes: 2+ at both patellar tendons, 1+ at achilles tendons on the left compared to the right Babinski's downgoing.  Strength at foot  Plantar-flexion: Mild weakness with 4 out of 5 strength on the left compared to the right  [id number]; 15 additional minutes spent for Therapeutic exercises as stated in above notes.  This included exercises focusing on stretching, strengthening, with significant focus on eccentric aspects.   Long term goals include an improvement in range of motion, strength, endurance as well as avoiding reinjury. Patient's frequency would include in 1-2 times a day, 3-5 times a week for a duration of 6-12 weeks. Low back exercises that included:  Pelvic tilt/bracing  instruction to focus on control of the pelvic girdle and lower abdominal muscles  Glute strengthening exercises, focusing on proper firing of the glutes without engaging the low back muscles Proper stretching techniques for maximum relief for the hamstrings, hip flexors, low back and some rotation where tolerated    Proper technique shown and discussed handout in great detail with ATC.  All questions were discussed and answered.      Impression and Recommendations:     This case required medical decision making of moderate complexity.      Note: This dictation was prepared with Dragon dictation along with smaller phrase technology. Any transcriptional errors that result from this process are unintentional.

## 2017-10-16 NOTE — Assessment & Plan Note (Signed)
Increasing frequency of migraines Rebound may be contributing some given use of fioricet and recent addition of tramadol Discussed that topamax may be beneficial and may be able to tolerate since being off for a prolonged period I also discussed that botox may be a good option as well Referral placed back to neurology.

## 2017-10-16 NOTE — Progress Notes (Signed)
William [name] - 57 y.o. male MRN [id number]  Date of birth: 1960-10-25  Subjective Chief Complaint  Patient presents with  . Establish Care    est care.Has severe migraines and would like to work on that. Seeing Dr tomorrow for pain in his back.    HPI William [name] is a 57 y.o. male here today to establish with new PCP.  He has a history of migraines, PSVT, Back pain with radiculopathy and gastroparesis.   -Migraines:  Reports suffering with migraines for several years.  Has headaches a few times per week and describes as throbbing pain behind left eye associated with light/sound sensitivity and nausea.  He has been seen by neurology in the past, treated with several medications including triptans, topamax and antidepressant (?tricyclics).  He had done well with topamax but reports that this eventually "messed up his stomach".  It was suggested that he try botox injections however was hesitant to try this and never followed up with neurology.  He was having some improvement of  Headaches but seem to be worsening again over the past few months.  Reports increased stress due to caring for his ill mother.  Currently using fioricet as needed but does try to limit this to episodes of severe headache.    -Back pain:  Following with sports medicine.  Seen last week and started on tramadol, steroid and gabapentin.  Some improvement with this but still having radiation of pain into left leg.  Denies weakness of the leg.  He does have follow up with Dr. Tamala Julian tomorrow.   -Gastroparesis:  Followed by Dr. Paulita Fujita with [facility].  Takes reglan as needed for symptoms.  Denies increased abdominal pain, nausea or vomiting, bloating at this time.   -PSVT:  This has been well controlled with cardizem CD daily with IR cardizem as needed for palpitations.  He is followed by cardiology for this.  Denies anginal symptoms, dizziness, shortness of breath at this time.    ROS:  ROS completed and negative  except as noted per HPI  Allergies  Allergen Reactions  . Erythromycin Nausea And Vomiting and Anaphylaxis  . Hydrocodone-Acetaminophen Nausea And Vomiting  . Prochlorperazine Edisylate     uncontrollable shake - tremor Other reaction(s): Other Shakes  . Tyloxapol Swelling  . Cephalexin Diarrhea and Nausea And Vomiting  . Cephalosporins Diarrhea and Nausea And Vomiting  . Ketoconazole Itching  . Ketorolac Nausea And Vomiting  . Latex Itching    REACTION: Reaction not known  . Nalbuphine Other (See Comments)    States loss of consciousness REACTION: Reaction not known  . Oxycodone-Acetaminophen Other (See Comments) and Swelling  . Prednisone Nausea And Vomiting  . Propranolol Diarrhea  . Restasis [Cyclosporine] Other (See Comments)    migraines  . Topamax [Topiramate] Diarrhea and Nausea And Vomiting  . Zetia [Ezetimibe] Diarrhea  . Cefdinir Diarrhea, Nausea And Vomiting and Rash  . Doxycycline Hyclate Nausea Only and Other (See Comments)    abd pain  . Neosporin [Neomycin-Bacitracin Zn-Polymyx] Rash    Past Medical History:  Diagnosis Date  . Allergy   . Anxiety   . Depression   . Gastroparesis   . Heart murmur   . Migraine headache   . Mitral valve prolapse     No past surgical history on file.  Social History   Socioeconomic History  . Marital status: Married    Spouse name: Not on file  . Number of children: Not on file  .  Years of education: Not on file  . Highest education level: Not on file  Occupational History  . Not on file  Social Needs  . Financial resource strain: Not on file  . Food insecurity:    Worry: Not on file    Inability: Not on file  . Transportation needs:    Medical: Not on file    Non-medical: Not on file  Tobacco Use  . Smoking status: Never Smoker  . Smokeless tobacco: Never Used  Substance and Sexual Activity  . Alcohol use: No    Alcohol/week: 0.0 oz  . Drug use: No  . Sexual activity: Yes    Partners: Female    Lifestyle  . Physical activity:    Days per week: Not on file    Minutes per session: Not on file  . Stress: Not on file  Relationships  . Social connections:    Talks on phone: Not on file    Gets together: Not on file    Attends religious service: Not on file    Active member of club or organization: Not on file    Attends meetings of clubs or organizations: Not on file    Relationship status: Not on file  Other Topics Concern  . Not on file  Social History Narrative  . Not on file    Family History  Problem Relation Age of Onset  . Hypertension Mother   . Hypertension Brother     Health Maintenance  Topic Date Due  . HIV Screening  10/08/1974  . INFLUENZA VACCINE  11/28/2017  . Fecal DNA (Cologuard)  06/17/2020  . TETANUS/TDAP  02/26/2024  . Hepatitis C Screening  Completed    ----------------------------------------------------------------------------------------------------------------------------------------------------------------------------------------------------------------- Physical Exam BP 112/76 (BP Location: Left Arm, Patient Position: Sitting, Cuff Size: Normal)   Pulse 81   Temp 97.9 F (36.6 C) (Oral)   Ht 5\' 10"  (1.778 m)   Wt 150 lb 9.6 oz (68.3 kg)   SpO2 97%   BMI 21.61 kg/m   Physical Exam  Constitutional: He is oriented to person, place, and time. He appears well-nourished. No distress.  HENT:  Head: Normocephalic and atraumatic.  Mouth/Throat: Oropharynx is clear and moist.  Eyes: No scleral icterus.  Neck: Neck supple. No thyromegaly present.  Cardiovascular: Normal rate, regular rhythm and normal heart sounds.  Pulmonary/Chest: Effort normal and breath sounds normal.  Neurological: He is alert and oriented to person, place, and time. No cranial nerve deficit or sensory deficit. He exhibits normal muscle tone. Coordination normal.  Skin: Skin is warm and dry. No rash noted.  Psychiatric: He has a normal mood and affect. His behavior  is normal.    ------------------------------------------------------------------------------------------------------------------------------------------------------------------------------------------------------------------- Assessment and Plan  Paroxysmal SVT (supraventricular tachycardia) ([place]) Stable at this time with current medications Recommend continuation He'll continue to follow with cardiology as well.   Migraine without aura and without status migrainosus, not intractable Increasing frequency of migraines Rebound may be contributing some given use of fioricet and recent addition of tramadol Discussed that topamax may be beneficial and may be able to tolerate since being off for a prolonged period I also discussed that botox may be a good option as well Referral placed back to neurology.   Gastroparesis Stable, uses reglan as needed.  He will also continue to follow with GI.    Left lumbar radiculopathy Followed by Dr. Tamala Julian, has follow up tomorrow.  Some improvement with recent medications.

## 2017-10-16 NOTE — Patient Instructions (Signed)
I have placed a referral back to neurology for you.  I think Botox may be a good option for control of your migraines I will see you back in about 6 months or sooner if needed.

## 2017-10-16 NOTE — Assessment & Plan Note (Signed)
Stable, uses reglan as needed.  He will also continue to follow with GI.

## 2017-10-17 ENCOUNTER — Telehealth: Payer: Self-pay | Admitting: Family Medicine

## 2017-10-17 ENCOUNTER — Ambulatory Visit (INDEPENDENT_AMBULATORY_CARE_PROVIDER_SITE_OTHER): Payer: PPO | Admitting: Family Medicine

## 2017-10-17 ENCOUNTER — Ambulatory Visit: Payer: PPO | Admitting: Family Medicine

## 2017-10-17 ENCOUNTER — Encounter: Payer: Self-pay | Admitting: Family Medicine

## 2017-10-17 DIAGNOSIS — M5416 Radiculopathy, lumbar region: Secondary | ICD-10-CM

## 2017-10-17 NOTE — Telephone Encounter (Signed)
Dr. Zigmund Daniel is aware. PCP changed back to Dr. Brigitte Pulse as pt requested.     Copied from [place] [id number]. Topic: General - Other >> Oct 17, 2017 11:17 AM William [name] wrote: Reason for CRM:  Patient was in to see Luetta Nutting as a new patient, but he really didn't want to change his PCP from Dr. [name] at [facility].  He didn't understand you can't have two PCPs.  So he would like to change his PCP back to Dr. [name].

## 2017-10-17 NOTE — Assessment & Plan Note (Signed)
Left lumbar radiculopathy.  Patient was 50% better but still has some decrease in strength as well as the deep tendon reflexes on the left side.  Patient has tramadol for breakthrough pain and muscle relaxer.  We discussed the possibility of titrating the gabapentin up more.  Patient declined formal physical therapy or advanced imaging at this point patient weakness seems to worsen I do feel an MRI will be necessary.  Patient will follow-up with me again 2 to 3 weeks.  At that point if any worsening symptoms or no improvement will encourage advanced imaging and possible epidural.

## 2017-10-17 NOTE — Patient Instructions (Signed)
Good to see you  We will watch it closely  Ice is your friend.  Exercises 3 times a week.  Gabapentin 1-3 tabs at night to help with the nerve  See me again in 2-3 weeks (ok to double book)

## 2017-10-18 ENCOUNTER — Ambulatory Visit: Payer: PPO | Admitting: Family Medicine

## 2017-11-01 ENCOUNTER — Ambulatory Visit (INDEPENDENT_AMBULATORY_CARE_PROVIDER_SITE_OTHER): Payer: PPO | Admitting: Family Medicine

## 2017-11-01 ENCOUNTER — Encounter: Payer: Self-pay | Admitting: Family Medicine

## 2017-11-01 ENCOUNTER — Other Ambulatory Visit: Payer: Self-pay

## 2017-11-01 VITALS — BP 129/85 | HR 100 | Temp 98.0°F | Resp 16 | Ht 70.0 in | Wt 148.0 lb

## 2017-11-01 DIAGNOSIS — G959 Disease of spinal cord, unspecified: Secondary | ICD-10-CM | POA: Diagnosis not present

## 2017-11-01 DIAGNOSIS — M5136 Other intervertebral disc degeneration, lumbar region: Secondary | ICD-10-CM

## 2017-11-01 DIAGNOSIS — M5416 Radiculopathy, lumbar region: Secondary | ICD-10-CM

## 2017-11-01 MED ORDER — PREDNISONE 20 MG PO TABS: ORAL_TABLET | ORAL | 0 refills | Status: DC

## 2017-11-01 NOTE — Progress Notes (Signed)
Subjective:  By signing my name below, I, [name], attest that this documentation has been prepared under the direction and in the presence of [name], MD Electronically Signed: Ladene Artist, ED Scribe 11/01/2017 at 8:25 AM.   Patient ID: William [name], male    DOB: 1960/11/26, 57 y.o.   MRN: [id number]  Chief Complaint  Patient presents with  . Follow-up    headcahes, pt states he feels better  . Back Pain    on the left side all the way down to feet, pt states been ongoing   HPI William [name] is a 57 y.o. male who presents to Primary Care at [facility] for f/u on HAs. Pt states HAs have improved.  Back Pain Pt states that he picked up a case of water containing 40 bottles 1 month ago. Pt noticed constant, 5/10 lower thoracic L-sided back pain that radiates down his L leg and into his L foot. Saw Dr. Hulan Saas who obtained XRs, mild arthritis. Prescribed gabapentin, 5 day course of 50 mg prednisone and tramadol which improved pain some but noted agitation with tramadol. F/u on 7/11.  Past Medical History:  Diagnosis Date  . Allergy   . Anxiety   . Depression   . Gastroparesis   . Heart murmur   . Migraine headache   . Mitral valve prolapse    History reviewed. No pertinent surgical history. Current Outpatient Medications on File Prior to Visit  Medication Sig Dispense Refill  . butalbital-acetaminophen-caffeine (FIORICET, ESGIC) 50-325-40 MG tablet Take 1 tablet by mouth every 4 (four) hours as needed for headache. 60 tablet 1  . cyclobenzaprine (FLEXERIL) 10 MG tablet Take 1 tablet (10 mg total) by mouth 3 (three) times daily as needed for muscle spasms. 90 tablet 3  . diltiazem (CARDIZEM CD) 120 MG 24 hr capsule Take 1 capsule (120 mg total) by mouth daily. 30 capsule 11  . diltiazem (CARDIZEM) 30 MG tablet Take 1 tablet (30 mg total) by mouth as needed. TAKE FOR PALPITATIONS 30 tablet 11  . fluticasone (FLONASE) 50 MCG/ACT nasal spray Place 2 sprays  daily into both nostrils. 16 g 6  . gabapentin (NEURONTIN) 100 MG capsule Take 2 capsules (200 mg total) by mouth at bedtime. 60 capsule 3  . ibuprofen (ADVIL,MOTRIN) 200 MG tablet Take by mouth.    . methocarbamol (ROBAXIN) 500 MG tablet Take 1 tablet (500 mg total) by mouth 4 (four) times daily. For muscle spasm 60 tablet 1  . metoCLOPramide (REGLAN) 5 MG/5ML solution Take 5 mLs (5 mg total) by mouth 4 (four) times daily -  before meals and at bedtime. 600 mL 1  . ondansetron (ZOFRAN) 4 MG tablet Take by mouth.    . promethazine (PHENERGAN) 12.5 MG tablet Take 1 tablet (12.5 mg total) by mouth every 8 (eight) hours as needed (migraine headache). 20 tablet 0  . traMADol (ULTRAM) 50 MG tablet Take 1 tablet (50 mg total) by mouth every 8 (eight) hours as needed. 30 tablet 0  . triamcinolone (NASACORT) 55 MCG/ACT AERO nasal inhaler Place 2 sprays into the nose daily. 1 Inhaler 12   No current facility-administered medications on file prior to visit.    Allergies  Allergen Reactions  . Erythromycin Nausea And Vomiting and Anaphylaxis  . Hydrocodone-Acetaminophen Nausea And Vomiting  . Prochlorperazine Edisylate     uncontrollable shake - tremor Other reaction(s): Other Shakes  . Tyloxapol Swelling  . Cephalexin Diarrhea and Nausea And Vomiting  .  Cephalosporins Diarrhea and Nausea And Vomiting  . Ketoconazole Itching  . Ketorolac Nausea And Vomiting  . Latex Itching    REACTION: Reaction not known  . Nalbuphine Other (See Comments)    States loss of consciousness REACTION: Reaction not known  . Oxycodone-Acetaminophen Other (See Comments) and Swelling  . Prednisone Nausea And Vomiting  . Propranolol Diarrhea  . Restasis [Cyclosporine] Other (See Comments)    migraines  . Topamax [Topiramate] Diarrhea and Nausea And Vomiting  . Zetia [Ezetimibe] Diarrhea  . Cefdinir Diarrhea, Nausea And Vomiting and Rash  . Doxycycline Hyclate Nausea Only and Other (See Comments)    abd pain  .  Neosporin [Neomycin-Bacitracin Zn-Polymyx] Rash   Family History  Problem Relation Age of Onset  . Hypertension Mother   . Hypertension Brother    Social History   Socioeconomic History  . Marital status: Married    Spouse name: Not on file  . Number of children: Not on file  . Years of education: Not on file  . Highest education level: Not on file  Occupational History  . Not on file  Social Needs  . Financial resource strain: Not on file  . Food insecurity:    Worry: Not on file    Inability: Not on file  . Transportation needs:    Medical: Not on file    Non-medical: Not on file  Tobacco Use  . Smoking status: Never Smoker  . Smokeless tobacco: Never Used  Substance and Sexual Activity  . Alcohol use: No    Alcohol/week: 0.0 oz  . Drug use: No  . Sexual activity: Yes    Partners: Female  Lifestyle  . Physical activity:    Days per week: Not on file    Minutes per session: Not on file  . Stress: Not on file  Relationships  . Social connections:    Talks on phone: Not on file    Gets together: Not on file    Attends religious service: Not on file    Active member of club or organization: Not on file    Attends meetings of clubs or organizations: Not on file    Relationship status: Not on file  Other Topics Concern  . Not on file  Social History Narrative  . Not on file   Depression screen [facility] 2/9 11/01/2017 10/16/2017 09/19/2017 09/05/2017 06/19/2017  Decreased Interest 0 0 0 0 0  Down, Depressed, Hopeless 0 0 0 0 0  PHQ - 2 Score 0 0 0 0 0  Altered sleeping - - - - -  Tired, decreased energy - - - - -  Change in appetite - - - - -  Feeling bad or failure about yourself  - - - - -  Trouble concentrating - - - - -  Moving slowly or fidgety/restless - - - - -  Suicidal thoughts - - - - -  PHQ-9 Score - - - - -  Some recent data might be hidden   Review of Systems  Musculoskeletal: Positive for back pain.  Neurological: Positive for headaches (improved).       Objective:   Physical Exam  Constitutional: He is oriented to person, place, and time. He appears well-developed and well-nourished. No distress.  HENT:  Head: Normocephalic and atraumatic.  Eyes: Conjunctivae and EOM are normal.  Neck: Neck supple. No tracheal deviation present.  Cardiovascular: Normal rate.  Pulmonary/Chest: Effort normal. No respiratory distress.  Musculoskeletal: Normal range of motion.  No  point tenderness over SI joint, l-spine or paraspinals. L hip flexors and dorsiflexion 4+/5, otherwise 5/5.  Neurological: He is alert and oriented to person, place, and time.  Reflex Scores:      Patellar reflexes are 2+ on the left side.      Achilles reflexes are 2+ on the left side. Skin: Skin is warm and dry.  Psychiatric: He has a normal mood and affect. His behavior is normal.  Nursing note and vitals reviewed.  BP 129/85   Pulse 100   Temp 98 F (36.7 C) (Oral)   Resp 16   Ht 5\' 10"  (1.778 m)   Wt 148 lb (67.1 kg)   SpO2 96%   BMI 21.24 kg/m     Assessment & Plan:   1. Lumbar back pain with radiculopathy affecting left lower extremity   2. Myelopathy ([place])   3. Other intervertebral disc degeneration, lumbar region     Orders Placed This Encounter  Procedures  . Ambulatory referral to Orthopedic Surgery    Referral Priority:   Routine    Referral Type:   Surgical    Referral Reason:   Specialty Services Required    Requested Specialty:   Orthopedic Surgery    Number of Visits Requested:   1  . Ambulatory referral to Physical Therapy    Referral Priority:   Routine    Referral Type:   Physical Medicine    Referral Reason:   Specialty Services Required    Requested Specialty:   Physical Therapy    Number of Visits Requested:   1    Meds ordered this encounter  Medications  . predniSONE (DELTASONE) 20 MG tablet    Sig: 3 tabs po x3d, 2 tabs po x 3d, 1 tab po x 3d    Dispense:  18 tablet    Refill:  0    I personally performed the  services described in this documentation, which was scribed in my presence. The recorded information has been reviewed and considered, and addended by me as needed.   [name], M.D.  Primary Care at [facility] [address] [place], [place] [address] [phone] phone [phone] fax  11/20/17 4:50 AM

## 2017-11-01 NOTE — Patient Instructions (Addendum)
Try another round of prednisone  Refer to spinal surgery to discuss injections after MRI. Start formal physical therapy in the meantime    IF you received an x-ray today, you will receive an invoice from [facility]. Please contact [facility] at [phone] with questions or concerns regarding your invoice.   IF you received labwork today, you will receive an invoice from [place]. Please contact [name] at [phone] with questions or concerns regarding your invoice.   Our billing staff will not be able to assist you with questions regarding bills from these companies.  You will be contacted with the lab results as soon as they are available. The fastest way to get your results is to activate your [name] account. Instructions are located on the last page of this paperwork. If you have not heard from [place] regarding the results in 2 weeks, please contact this office.     sciati Sciatica Sciatica is pain, numbness, weakness, or tingling along the path of the sciatic nerve. The sciatic nerve starts in the lower back and runs down the back of each leg. The nerve controls the muscles in the lower leg and in the back of the knee. It also provides feeling (sensation) to the back of the thigh, the lower leg, and the sole of the foot. Sciatica is a symptom of another medical condition that pinches or puts pressure on the sciatic nerve. Generally, sciatica only affects one side of the body. Sciatica usually goes away on its own or with treatment. In some cases, sciatica may keep coming back (recur). What are the causes? This condition is caused by pressure on the sciatic nerve, or pinching of the sciatic nerve. This may be the result of:  A disk in between the bones of the spine (vertebrae) bulging out too far (herniated disk).  Age-related changes in the spinal disks (degenerative disk disease).  A pain disorder that affects a muscle in the buttock (piriformis  syndrome).  Extra bone growth (bone spur) near the sciatic nerve.  An injury or break (fracture) of the pelvis.  Pregnancy.  Tumor (rare).  What increases the risk? The following factors may make you more likely to develop this condition:  Playing sports that place pressure or stress on the spine, such as football or weight lifting.  Having poor strength and flexibility.  A history of back injury.  A history of back surgery.  Sitting for long periods of time.  Doing activities that involve repetitive bending or lifting.  Obesity.  What are the signs or symptoms? Symptoms can vary from mild to very severe, and they may include:  Any of these problems in the lower back, leg, hip, or buttock: ? Mild tingling or dull aches. ? Burning sensations. ? Sharp pains.  Numbness in the back of the calf or the sole of the foot.  Leg weakness.  Severe back pain that makes movement difficult.  These symptoms may get worse when you cough, sneeze, or laugh, or when you sit or stand for long periods of time. Being overweight may also make symptoms worse. In some cases, symptoms may recur over time. How is this diagnosed? This condition may be diagnosed based on:  Your symptoms.  A physical exam. Your health care provider may ask you to do certain movements to check whether those movements trigger your symptoms.  You may have tests, including: ? Blood tests. ? X-rays. ? MRI. ? CT scan.  How is this treated? In many cases, this  condition improves on its own, without any treatment. However, treatment may include:  Reducing or modifying physical activity during periods of pain.  Exercising and stretching to strengthen your abdomen and improve the flexibility of your spine.  Icing and applying heat to the affected area.  Medicines that help: ? To relieve pain and swelling. ? To relax your muscles.  Injections of medicines that help to relieve pain, irritation, and  inflammation around the sciatic nerve (steroids).  Surgery.  Follow these instructions at home: Medicines  Take over-the-counter and prescription medicines only as told by your health care provider.  Do not drive or operate heavy machinery while taking prescription pain medicine. Managing pain  If directed, apply ice to the affected area. ? Put ice in a plastic bag. ? Place a towel between your skin and the bag. ? Leave the ice on for 20 minutes, 2-3 times a day.  After icing, apply heat to the affected area before you exercise or as often as told by your health care provider. Use the heat source that your health care provider recommends, such as a moist heat pack or a heating pad. ? Place a towel between your skin and the heat source. ? Leave the heat on for 20-30 minutes. ? Remove the heat if your skin turns bright red. This is especially important if you are unable to feel pain, heat, or cold. You may have a greater risk of getting burned. Activity  Return to your normal activities as told by your health care provider. Ask your health care provider what activities are safe for you. ? Avoid activities that make your symptoms worse.  Take brief periods of rest throughout the day. Resting in a lying or standing position is usually better than sitting to rest. ? When you rest for longer periods, mix in some mild activity or stretching between periods of rest. This will help to prevent stiffness and pain. ? Avoid sitting for long periods of time without moving. Get up and move around at least one time each hour.  Exercise and stretch regularly, as told by your health care provider.  Do not lift anything that is heavier than 10 lb (4.5 kg) while you have symptoms of sciatica. When you do not have symptoms, you should still avoid heavy lifting, especially repetitive heavy lifting.  When you lift objects, always use proper lifting technique, which includes: ? Bending your  knees. ? Keeping the load close to your body. ? Avoiding twisting. General instructions  Use good posture. ? Avoid leaning forward while sitting. ? Avoid hunching over while standing.  Maintain a healthy weight. Excess weight puts extra stress on your back and makes it difficult to maintain good posture.  Wear supportive, comfortable shoes. Avoid wearing high heels.  Avoid sleeping on a mattress that is too soft or too hard. A mattress that is firm enough to support your back when you sleep may help to reduce your pain.  Keep all follow-up visits as told by your health care provider. This is important. Contact a health care provider if:  You have pain that wakes you up when you are sleeping.  You have pain that gets worse when you lie down.  Your pain is worse than you have experienced in the past.  Your pain lasts longer than 4 weeks.  You experience unexplained weight loss. Get help right away if:  You lose control of your bowel or bladder (incontinence).  You have: ? Weakness in your lower  back, pelvis, buttocks, or legs that gets worse. ? Redness or swelling of your back. ? A burning sensation when you urinate. This information is not intended to replace advice given to you by your health care provider. Make sure you discuss any questions you have with your health care provider. Document Released: 04/10/2001 Document Revised: 09/20/2015 Document Reviewed: 12/24/2014 Elsevier Interactive Patient Education  2018 [employer].   Sciatica Rehab Ask your health care provider which exercises are safe for you. Do exercises exactly as told by your health care provider and adjust them as directed. It is normal to feel mild stretching, pulling, tightness, or discomfort as you do these exercises, but you should stop right away if you feel sudden pain or your pain gets worse.Do not begin these exercises until told by your health care provider. Stretching and range of motion  exercises These exercises warm up your muscles and joints and improve the movement and flexibility of your hips and your back. These exercises also help to relieve pain, numbness, and tingling. Exercise A: Sciatic nerve glide 1. Sit in a chair with your head facing down toward your chest. Place your hands behind your back. Let your shoulders slump forward. 2. Slowly straighten one of your knees while you tilt your head back as if you are looking toward the ceiling. Only straighten your leg as far as you can without making your symptoms worse. 3. Hold for __________ seconds. 4. Slowly return to the starting position. 5. Repeat with your other leg. Repeat __________ times. Complete this exercise __________ times a day. Exercise B: Knee to chest with hip adduction and internal rotation  1. Lie on your back on a firm surface with both legs straight. 2. Bend one of your knees and move it up toward your chest until you feel a gentle stretch in your lower back and buttock. Then, move your knee toward the shoulder that is on the opposite side from your leg. ? Hold your leg in this position by holding onto the front of your knee. 3. Hold for __________ seconds. 4. Slowly return to the starting position. 5. Repeat with your other leg. Repeat __________ times. Complete this exercise __________ times a day. Exercise C: Prone extension on elbows  1. Lie on your abdomen on a firm surface. A bed may be too soft for this exercise. 2. Prop yourself up on your elbows. 3. Use your arms to help lift your chest up until you feel a gentle stretch in your abdomen and your lower back. ? This will place some of your body weight on your elbows. If this is uncomfortable, try stacking pillows under your chest. ? Your hips should stay down, against the surface that you are lying on. Keep your hip and back muscles relaxed. 4. Hold for __________ seconds. 5. Slowly relax your upper body and return to the starting  position. Repeat __________ times. Complete this exercise __________ times a day. Strengthening exercises These exercises build strength and endurance in your back. Endurance is the ability to use your muscles for a long time, even after they get tired. Exercise D: Pelvic tilt 1. Lie on your back on a firm surface. Bend your knees and keep your feet flat. 2. Tense your abdominal muscles. Tip your pelvis up toward the ceiling and flatten your lower back into the floor. ? To help with this exercise, you may place a small towel under your lower back and try to push your back into the towel. 3.  Hold for __________ seconds. 4. Let your muscles relax completely before you repeat this exercise. Repeat __________ times. Complete this exercise __________ times a day. Exercise E: Alternating arm and leg raises  1. Get on your hands and knees on a firm surface. If you are on a hard floor, you may want to use padding to cushion your knees, such as an exercise mat. 2. Line up your arms and legs. Your hands should be below your shoulders, and your knees should be below your hips. 3. Lift your left leg behind you. At the same time, raise your right arm and straighten it in front of you. ? Do not lift your leg higher than your hip. ? Do not lift your arm higher than your shoulder. ? Keep your abdominal and back muscles tight. ? Keep your hips facing the ground. ? Do not arch your back. ? Keep your balance carefully, and do not hold your breath. 4. Hold for __________ seconds. 5. Slowly return to the starting position and repeat with your right leg and your left arm. Repeat __________ times. Complete this exercise __________ times a day. Posture and body mechanics  Body mechanics refers to the movements and positions of your body while you do your daily activities. Posture is part of body mechanics. Good posture and healthy body mechanics can help to relieve stress in your body's tissues and joints. Good  posture means that your spine is in its natural S-curve position (your spine is neutral), your shoulders are pulled back slightly, and your head is not tipped forward. The following are general guidelines for applying improved posture and body mechanics to your everyday activities. Standing   When standing, keep your spine neutral and your feet about hip-width apart. Keep a slight bend in your knees. Your ears, shoulders, and hips should line up.  When you do a task in which you stand in one place for a long time, place one foot up on a stable object that is 2-4 inches (5-10 cm) high, such as a footstool. This helps keep your spine neutral. Sitting   When sitting, keep your spine neutral and keep your feet flat on the floor. Use a footrest, if necessary, and keep your thighs parallel to the floor. Avoid rounding your shoulders, and avoid tilting your head forward.  When working at a desk or a computer, keep your desk at a height where your hands are slightly lower than your elbows. Slide your chair under your desk so you are close enough to maintain good posture.  When working at a computer, place your monitor at a height where you are looking straight ahead and you do not have to tilt your head forward or downward to look at the screen. Resting   When lying down and resting, avoid positions that are most painful for you.  If you have pain with activities such as sitting, bending, stooping, or squatting (flexion-based activities), lie in a position in which your body does not bend very much. For example, avoid curling up on your side with your arms and knees near your chest (fetal position).  If you have pain with activities such as standing for a long time or reaching with your arms (extension-based activities), lie with your spine in a neutral position and bend your knees slightly. Try the following positions: ? Lying on your side with a pillow between your knees. ? Lying on your back with  a pillow under your knees. Lifting   When lifting  objects, keep your feet at least shoulder-width apart and tighten your abdominal muscles.  Bend your knees and hips and keep your spine neutral. It is important to lift using the strength of your legs, not your back. Do not lock your knees straight out.  Always ask for help to lift heavy or awkward objects. This information is not intended to replace advice given to you by your health care provider. Make sure you discuss any questions you have with your health care provider. Document Released: 04/16/2005 Document Revised: 12/22/2015 Document Reviewed: 12/31/2014 Elsevier Interactive Patient Education  [employer].

## 2017-11-07 ENCOUNTER — Ambulatory Visit: Payer: PPO | Admitting: Family Medicine

## 2017-11-13 ENCOUNTER — Ambulatory Visit (INDEPENDENT_AMBULATORY_CARE_PROVIDER_SITE_OTHER): Payer: PPO | Admitting: Family Medicine

## 2017-11-13 ENCOUNTER — Encounter: Payer: Self-pay | Admitting: Family Medicine

## 2017-11-13 DIAGNOSIS — M5416 Radiculopathy, lumbar region: Secondary | ICD-10-CM | POA: Diagnosis not present

## 2017-11-13 MED ORDER — METHYLPREDNISOLONE ACETATE 80 MG/ML IJ SUSP
80.0000 mg | Freq: Once | INTRAMUSCULAR | Status: AC
  Administered 2017-11-13: 80 mg via INTRAMUSCULAR

## 2017-11-13 MED ORDER — KETOROLAC TROMETHAMINE 60 MG/2ML IM SOLN
60.0000 mg | Freq: Once | INTRAMUSCULAR | Status: AC
  Administered 2017-11-13: 60 mg via INTRAMUSCULAR

## 2017-11-13 NOTE — Patient Instructions (Signed)
Good to see you  2 injections today  MRi is a good idea and call [phone] Call us after the MRI at [phone] and then I will look at it and order the correct injection  Ice is still a good idea Hold on home exercises and PT and we will see what we got After the injection see em again 2 weeks AFTER the injection

## 2017-11-13 NOTE — Assessment & Plan Note (Signed)
Worsening symptoms, failed second round of prednisone, MRI has been ordered and encourage patient to schedule as soon as possible.  Patient is on gabapentin and increase dose to 300 mg, discussed icing regimen, muscle relaxer given.  Patient given 2 injections a day and warned of potential side effects.  Patient will likely be a candidate for epidural or nerve root injections depending on findings.  Would like patient to hold though on any type of surgical intervention.  Following up with me after imaging to discuss further treatment.

## 2017-11-13 NOTE — Progress Notes (Signed)
[name] [place], [name] [address] Phone: [phone] Subjective:     CC: Back pain follow-up  KCL:[id number]  [name] is a 57 y.o. male coming in with complaint of back pain.  Patient has had more of a lumbar radiculopathy.  Was making some improvement given home exercises discussed icing regimen patient did see primary care.  Patient was given another round of prednisone with no significant benefit.  Continues to have pain this radiating down the leg and is more of a nagging sensation now.  Sometimes feels like he has weakness.  Even daily activities have become more difficult.  Denies any fevers or chills.  Patient states that the pain was unrelenting.  Patient does have an MRI ordered but he has not scheduled it at this time.  Also was referred to spinal surgery     Past Medical History:  Diagnosis Date  . Allergy   . Anxiety   . Depression   . Gastroparesis   . Heart murmur   . Migraine headache   . Mitral valve prolapse    No past surgical history on file. Social History   Socioeconomic History  . Marital status: Married    Spouse name: Not on file  . Number of children: Not on file  . Years of education: Not on file  . Highest education level: Not on file  Occupational History  . Not on file  Social Needs  . Financial resource strain: Not on file  . Food insecurity:    Worry: Not on file    Inability: Not on file  . Transportation needs:    Medical: Not on file    Non-medical: Not on file  Tobacco Use  . Smoking status: Never Smoker  . Smokeless tobacco: Never Used  Substance and Sexual Activity  . Alcohol use: No    Alcohol/week: 0.0 oz  . Drug use: No  . Sexual activity: Yes    Partners: Female  Lifestyle  . Physical activity:    Days per week: Not on file    Minutes per session: Not on file  . Stress: Not on file  Relationships  . Social connections:    Talks on phone: Not on file   Gets together: Not on file    Attends religious service: Not on file    Active member of club or organization: Not on file    Attends meetings of clubs or organizations: Not on file    Relationship status: Not on file  Other Topics Concern  . Not on file  Social History Narrative  . Not on file   Allergies  Allergen Reactions  . Erythromycin Nausea And Vomiting and Anaphylaxis  . Hydrocodone-Acetaminophen Nausea And Vomiting  . Prochlorperazine Edisylate     uncontrollable shake - tremor Other reaction(s): Other Shakes  . Tyloxapol Swelling  . Cephalexin Diarrhea and Nausea And Vomiting  . Cephalosporins Diarrhea and Nausea And Vomiting  . Ketoconazole Itching  . Ketorolac Nausea And Vomiting  . Latex Itching    REACTION: Reaction not known  . Nalbuphine Other (See Comments)    States loss of consciousness REACTION: Reaction not known  . Oxycodone-Acetaminophen Other (See Comments) and Swelling  . Prednisone Nausea And Vomiting  . Propranolol Diarrhea  . Restasis [Cyclosporine] Other (See Comments)    migraines  . Topamax [Topiramate] Diarrhea and Nausea And Vomiting  . Zetia [Ezetimibe] Diarrhea  . Cefdinir Diarrhea, Nausea And Vomiting and  Rash  . Doxycycline Hyclate Nausea Only and Other (See Comments)    abd pain  . Neosporin [Neomycin-Bacitracin Zn-Polymyx] Rash   Family History  Problem Relation Age of Onset  . Hypertension Mother   . Hypertension Brother      Past medical history, social, surgical and family history all reviewed in electronic medical record.  No pertanent information unless stated regarding to the chief complaint.   Review of Systems:Review of systems updated and as accurate as of 11/13/17  No headache, visual changes, nausea, vomiting, diarrhea, constipation, dizziness, abdominal pain, skin rash, fevers, chills, night sweats, weight loss, swollen lymph nodes, body aches, joint swelling, muscle aches, chest pain, shortness of breath, mood  changes.   Objective  Blood pressure 116/64, pulse 94, height 5\' 10"  (1.778 m), weight 149 lb (67.6 kg), SpO2 98 %. Systems examined below as of 11/13/17   General: No apparent distress alert and oriented x3 mood and affect normal, dressed appropriately.  HEENT: Pupils equal, extraocular movements intact  Respiratory: Patient's speak in full sentences and does not appear short of breath  Cardiovascular: No lower extremity edema, non tender, no erythema  Skin: Warm dry intact with no signs of infection or rash on extremities or on axial skeleton.  Abdomen: Soft nontender  Neuro: Cranial nerves II through XII are intact, neurovascularly intact in all extremities with  and 2+ pulses.  Lymph: No lymphadenopathy of posterior or anterior cervical chain or axillae bilaterally.  Gait weakness of the left leg noted MSK:  Non tender with full range of motion and good stability and symmetric strength and tone of shoulders, elbows, wrist, hip, knee and ankles bilaterally.  Back Exam:  Inspection: Significant loss of lordosis Motion: Flexion 15 deg with radicular symptoms going down the left leg, Extension 45 deg, Side Bending to 45 deg bilaterally,  Rotation to 25 deg bilaterally  SLR laying: Positive left XSLR laying: Positive left Palpable tenderness: Tender to palpation over the paraspinal musculature on the left side more over the lumbosacral area. FABER: negative. Sensory change: Gross sensation intact to all lumbar and sacral dermatomes.  Reflexes: 2+ at both patellar tendons,  Babinski's downgoing.  Strength at foot  Plantar-flexion: 45 on the left compared to contralateral side with a 1+ Achilles deep tendon reflex Leg strength  Quad: 5/5 Hamstring: 5/5 Hip flexor: 5/5 Hip abductors: 4/5      Impression and Recommendations:     This case required medical decision making of moderate complexity.      Note: This dictation was prepared with Dragon dictation along with smaller phrase  technology. Any transcriptional errors that result from this process are unintentional.

## 2017-11-14 ENCOUNTER — Ambulatory Visit
Admission: RE | Admit: 2017-11-14 | Discharge: 2017-11-14 | Disposition: A | Discharge status: Home / Self Care | Payer: PPO | Source: Ambulatory Visit | Attending: Family Medicine | Admitting: Family Medicine

## 2017-11-14 DIAGNOSIS — M5416 Radiculopathy, lumbar region: Secondary | ICD-10-CM

## 2017-11-14 DIAGNOSIS — M48061 Spinal stenosis, lumbar region without neurogenic claudication: Secondary | ICD-10-CM | POA: Diagnosis not present

## 2017-11-15 ENCOUNTER — Encounter: Payer: Self-pay | Admitting: Family Medicine

## 2017-11-15 ENCOUNTER — Other Ambulatory Visit: Payer: Self-pay | Admitting: *Deleted

## 2017-11-15 DIAGNOSIS — M5416 Radiculopathy, lumbar region: Secondary | ICD-10-CM

## 2017-11-20 ENCOUNTER — Encounter: Payer: Self-pay | Admitting: Family Medicine

## 2017-11-20 ENCOUNTER — Other Ambulatory Visit: Payer: Self-pay

## 2017-11-20 ENCOUNTER — Ambulatory Visit (INDEPENDENT_AMBULATORY_CARE_PROVIDER_SITE_OTHER): Payer: PPO | Admitting: Family Medicine

## 2017-11-20 VITALS — BP 122/78 | HR 82 | Temp 97.6°F | Ht 70.0 in | Wt 148.2 lb

## 2017-11-20 DIAGNOSIS — E559 Vitamin D deficiency, unspecified: Secondary | ICD-10-CM | POA: Diagnosis not present

## 2017-11-20 DIAGNOSIS — M5416 Radiculopathy, lumbar region: Secondary | ICD-10-CM

## 2017-11-20 DIAGNOSIS — R828 Abnormal findings on cytological and histological examination of urine: Secondary | ICD-10-CM

## 2017-11-20 DIAGNOSIS — R8289 Other abnormal findings on cytological and histological examination of urine: Secondary | ICD-10-CM

## 2017-11-20 MED ORDER — VITAMIN D (ERGOCALCIFEROL) 1.25 MG (50000 UNIT) PO CAPS: 50000.0000 [IU] | ORAL_CAPSULE | ORAL | 1 refills | Status: DC

## 2017-11-20 NOTE — Progress Notes (Signed)
Subjective:  By signing my name below, I, [name], attest that this documentation has been prepared under the direction and in the presence of [name], MD Electronically Signed: Ladene Artist, ED Scribe 11/20/2017 at 8:40 AM.   Patient ID: William [name], male    DOB: [birth year], 57 y.o.   MRN: [id number]  Chief Complaint  Patient presents with  . Pain    pain on the left side for over a month, this is a follow up appt, Having an injection in the back done at [facility] radiology nx week   HPI William [name] "William [name]" is a 57 y.o. male who presents to Primary Care at [facility] complaining of back pain radiating to the L LE x 1 month. Pt states he has back pain with just sitting on the examination table. He has been taking Flexeril 10 mg prn and gabapentin 200 mg qhs which he states helps him sleep, but he is still having pain throughout the day. Pt is scheduled to have an injection in his back at [facility] on 7/31.  Past Medical History:  Diagnosis Date  . Allergy   . Anxiety   . Depression   . Gastroparesis   . Heart murmur   . Migraine headache   . Mitral valve prolapse    History reviewed. No pertinent surgical history. Current Outpatient Medications on File Prior to Visit  Medication Sig Dispense Refill  . butalbital-acetaminophen-caffeine (FIORICET, ESGIC) 50-325-40 MG tablet Take 1 tablet by mouth every 4 (four) hours as needed for headache. 60 tablet 1  . cyclobenzaprine (FLEXERIL) 10 MG tablet Take 1 tablet (10 mg total) by mouth 3 (three) times daily as needed for muscle spasms. 90 tablet 3  . diltiazem (CARDIZEM CD) 120 MG 24 hr capsule Take 1 capsule (120 mg total) by mouth daily. 30 capsule 11  . diltiazem (CARDIZEM) 30 MG tablet Take 1 tablet (30 mg total) by mouth as needed. TAKE FOR PALPITATIONS 30 tablet 11  . fluticasone (FLONASE) 50 MCG/ACT nasal spray Place 2 sprays daily into both nostrils. 16 g 6  . gabapentin (NEURONTIN) 100 MG  capsule Take 2 capsules (200 mg total) by mouth at bedtime. 60 capsule 3  . methocarbamol (ROBAXIN) 500 MG tablet Take 1 tablet (500 mg total) by mouth 4 (four) times daily. For muscle spasm 60 tablet 1  . metoCLOPramide (REGLAN) 5 MG/5ML solution Take 5 mLs (5 mg total) by mouth 4 (four) times daily -  before meals and at bedtime. 600 mL 1  . ondansetron (ZOFRAN) 4 MG tablet Take by mouth.    . predniSONE (DELTASONE) 20 MG tablet 3 tabs po x3d, 2 tabs po x 3d, 1 tab po x 3d 18 tablet 0  . promethazine (PHENERGAN) 12.5 MG tablet Take 1 tablet (12.5 mg total) by mouth every 8 (eight) hours as needed (migraine headache). 20 tablet 0  . traMADol (ULTRAM) 50 MG tablet Take 1 tablet (50 mg total) by mouth every 8 (eight) hours as needed. 30 tablet 0   No current facility-administered medications on file prior to visit.    Allergies  Allergen Reactions  . Erythromycin Nausea And Vomiting and Anaphylaxis  . Hydrocodone-Acetaminophen Nausea And Vomiting  . Prochlorperazine Edisylate     uncontrollable shake - tremor Other reaction(s): Other Shakes  . Tyloxapol Swelling  . Cephalexin Diarrhea and Nausea And Vomiting  . Cephalosporins Diarrhea and Nausea And Vomiting  . Ketoconazole Itching  . Ketorolac Nausea And Vomiting  .  Latex Itching    REACTION: Reaction not known  . Nalbuphine Other (See Comments)    States loss of consciousness REACTION: Reaction not known  . Oxycodone-Acetaminophen Other (See Comments) and Swelling  . Prednisone Nausea And Vomiting  . Propranolol Diarrhea  . Restasis [Cyclosporine] Other (See Comments)    migraines  . Topamax [Topiramate] Diarrhea and Nausea And Vomiting  . Zetia [Ezetimibe] Diarrhea  . Cefdinir Diarrhea, Nausea And Vomiting and Rash  . Doxycycline Hyclate Nausea Only and Other (See Comments)    abd pain  . Neosporin [Neomycin-Bacitracin Zn-Polymyx] Rash   Family History  Problem Relation Age of Onset  . Hypertension Mother   . Hypertension  Brother    Social History   Socioeconomic History  . Marital status: Married    Spouse name: Not on file  . Number of children: Not on file  . Years of education: Not on file  . Highest education level: Not on file  Occupational History  . Not on file  Social Needs  . Financial resource strain: Not on file  . Food insecurity:    Worry: Not on file    Inability: Not on file  . Transportation needs:    Medical: Not on file    Non-medical: Not on file  Tobacco Use  . Smoking status: Never Smoker  . Smokeless tobacco: Never Used  Substance and Sexual Activity  . Alcohol use: No    Alcohol/week: 0.0 oz  . Drug use: No  . Sexual activity: Yes    Partners: Female  Lifestyle  . Physical activity:    Days per week: Not on file    Minutes per session: Not on file  . Stress: Not on file  Relationships  . Social connections:    Talks on phone: Not on file    Gets together: Not on file    Attends religious service: Not on file    Active member of club or organization: Not on file    Attends meetings of clubs or organizations: Not on file    Relationship status: Not on file  Other Topics Concern  . Not on file  Social History Narrative  . Not on file   Depression screen [facility] 2/9 11/20/2017 11/01/2017 10/16/2017 09/19/2017 09/05/2017  Decreased Interest 0 0 0 0 0  Down, Depressed, Hopeless 0 0 0 0 0  PHQ - 2 Score 0 0 0 0 0  Altered sleeping - - - - -  Tired, decreased energy - - - - -  Change in appetite - - - - -  Feeling bad or failure about yourself  - - - - -  Trouble concentrating - - - - -  Moving slowly or fidgety/restless - - - - -  Suicidal thoughts - - - - -  PHQ-9 Score - - - - -  Some recent data might be hidden   Review of Systems  Musculoskeletal: Positive for back pain.      Objective:   Physical Exam  Constitutional: He is oriented to person, place, and time. He appears well-developed and well-nourished. No distress.  HENT:  Head: Normocephalic and  atraumatic.  Eyes: Conjunctivae and EOM are normal.  Neck: Neck supple. No tracheal deviation present.  Cardiovascular: Normal rate.  Pulmonary/Chest: Effort normal. No respiratory distress.  Musculoskeletal: Normal range of motion.  Neurological: He is alert and oriented to person, place, and time.  Skin: Skin is warm and dry.  Psychiatric: He has a normal mood  and affect. His behavior is normal.  Nursing note and vitals reviewed.  BP 122/78 (BP Location: Left Arm, Patient Position: Sitting, Cuff Size: Normal)   Pulse 82   Temp 97.6 F (36.4 C) (Oral)   Ht 5\' 10"  (1.778 m)   Wt 148 lb 3.2 oz (67.2 kg)   SpO2 96%   BMI 21.26 kg/m     Results for orders placed or performed in visit on 09/19/17  POCT urinalysis dipstick  Result Value Ref Range   Color, UA yellow yellow   Clarity, UA clear clear   Glucose, UA negative negative mg/dL   Bilirubin, UA small (A) negative   Ketones, POC UA trace (5) (A) negative mg/dL   Spec Grav, UA >=1.030 (A) 1.010 - 1.025   Blood, UA moderate (A) negative   pH, UA 5.5 5.0 - 8.0   Protein Ur, POC negative negative mg/dL   Urobilinogen, UA 0.2 0.2 or 1.0 E.U./dL   Nitrite, UA Negative Negative   Leukocytes, UA Negative Negative    Assessment & Plan:   1. Lumbar back pain with radiculopathy affecting left lower extremity   2. Abnormal urine cytology   3. Vitamin D deficiency -  Lab Results  Component Value Date   VD25OH 19.7 (L) 05/28/2017   VD25OH 20.1 (L) 12/29/2016   VD25OH 10.5 (L) 04/12/2016   VD25OH 11 (L) 05/14/2015   VD25OH 10 (L) 09/09/2014   VD25OH 21 (L) 10/22/2013      Orders Placed This Encounter  Procedures  . Protein / Creatinine Ratio, Urine  . POCT urinalysis dipstick  . POCT Microscopic Urinalysis ([facility])    Meds ordered this encounter  Medications  . Vitamin D, Ergocalciferol, (DRISDOL) 50000 units CAPS capsule    Sig: Take 1 capsule (50,000 Units total) by mouth every 7 (seven) days.    Dispense:  12  capsule    Refill:  1   I personally performed the services described in this documentation, which was scribed in my presence. The recorded information has been reviewed and considered, and addended by me as needed.   [name], M.D.  Primary Care at [facility] [address] [place], [place] [address] [phone] phone [phone] fax  02/16/18 2:38 AM

## 2017-11-20 NOTE — Patient Instructions (Signed)
     IF you received an x-ray today, you will receive an invoice from [facility]. Please contact [facility] at [phone] with questions or concerns regarding your invoice.   IF you received labwork today, you will receive an invoice from [name]. Please contact [name] at [phone] with questions or concerns regarding your invoice.   Our billing staff will not be able to assist you with questions regarding bills from these companies.  You will be contacted with the lab results as soon as they are available. The fastest way to get your results is to activate your [name] account. Instructions are located on the last page of this paperwork. If you have not heard from us regarding the results in 2 weeks, please contact this office.     

## 2017-11-20 NOTE — Progress Notes (Deleted)
Vitamin d def - consider checking Lab Results  Component Value Date   VD25OH 19.7 (L) 05/28/2017   VD25OH 20.1 (L) 12/29/2016   VD25OH 10.5 (L) 04/12/2016   VD25OH 11 (L) 05/14/2015   VD25OH 10 (L) 09/09/2014   VD25OH 21 (L) 10/22/2013   VD25OH 15 (L) 04/22/2012   VD25OH 17 (L) 04/04/2012

## 2017-11-21 ENCOUNTER — Other Ambulatory Visit: Payer: PPO

## 2017-11-27 ENCOUNTER — Other Ambulatory Visit: Payer: PPO

## 2017-11-27 ENCOUNTER — Ambulatory Visit
Admission: RE | Admit: 2017-11-27 | Discharge: 2017-11-27 | Disposition: A | Discharge status: Home / Self Care | Payer: PPO | Source: Ambulatory Visit | Attending: Family Medicine | Admitting: Family Medicine

## 2017-11-27 DIAGNOSIS — M47817 Spondylosis without myelopathy or radiculopathy, lumbosacral region: Secondary | ICD-10-CM | POA: Diagnosis not present

## 2017-11-27 DIAGNOSIS — M5416 Radiculopathy, lumbar region: Secondary | ICD-10-CM

## 2017-11-27 MED ORDER — IOPAMIDOL (ISOVUE-M 200) INJECTION 41%
1.0000 mL | Freq: Once | INTRAMUSCULAR | Status: AC
  Administered 2017-11-27: 1 mL via EPIDURAL

## 2017-11-27 MED ORDER — METHYLPREDNISOLONE ACETATE 40 MG/ML INJ SUSP (RADIOLOG
120.0000 mg | Freq: Once | INTRAMUSCULAR | Status: AC
  Administered 2017-11-27: 120 mg via EPIDURAL

## 2017-11-27 NOTE — Discharge Instructions (Signed)

## 2017-12-05 ENCOUNTER — Ambulatory Visit (INDEPENDENT_AMBULATORY_CARE_PROVIDER_SITE_OTHER): Payer: PPO | Admitting: Family Medicine

## 2017-12-05 ENCOUNTER — Other Ambulatory Visit: Payer: Self-pay

## 2017-12-05 ENCOUNTER — Encounter: Payer: Self-pay | Admitting: Family Medicine

## 2017-12-05 VITALS — BP 112/68 | HR 88 | Temp 98.0°F | Resp 16 | Ht 71.85 in | Wt 146.2 lb

## 2017-12-05 DIAGNOSIS — R102 Pelvic and perineal pain: Secondary | ICD-10-CM | POA: Diagnosis not present

## 2017-12-05 DIAGNOSIS — M5136 Other intervertebral disc degeneration, lumbar region: Secondary | ICD-10-CM | POA: Diagnosis not present

## 2017-12-05 DIAGNOSIS — G8929 Other chronic pain: Secondary | ICD-10-CM | POA: Diagnosis not present

## 2017-12-05 DIAGNOSIS — M6289 Other specified disorders of muscle: Secondary | ICD-10-CM

## 2017-12-05 DIAGNOSIS — M5416 Radiculopathy, lumbar region: Secondary | ICD-10-CM

## 2017-12-05 DIAGNOSIS — R800 Isolated proteinuria: Secondary | ICD-10-CM

## 2017-12-05 LAB — POCT URINALYSIS DIP (MANUAL ENTRY)
Bilirubin, UA: NEGATIVE
Glucose, UA: NEGATIVE mg/dL
Ketones, POC UA: NEGATIVE mg/dL
Leukocytes, UA: NEGATIVE
NITRITE UA: NEGATIVE
PROTEIN UA: NEGATIVE mg/dL
Spec Grav, UA: 1.015 (ref 1.010–1.025)
UROBILINOGEN UA: 0.2 U/dL
pH, UA: 6 (ref 5.0–8.0)

## 2017-12-05 NOTE — Patient Instructions (Signed)
     IF you received an x-ray today, you will receive an invoice from [facility]. Please contact [facility] at [phone] with questions or concerns regarding your invoice.   IF you received labwork today, you will receive an invoice from [name]. Please contact [name] at [phone] with questions or concerns regarding your invoice.   Our billing staff will not be able to assist you with questions regarding bills from these companies.  You will be contacted with the lab results as soon as they are available. The fastest way to get your results is to activate your [name] account. Instructions are located on the last page of this paperwork. If you have not heard from us regarding the results in 2 weeks, please contact this office.     

## 2017-12-05 NOTE — Progress Notes (Signed)
Subjective:    Patient ID: William [name], male    DOB: [birth year], 57 y.o.   MRN: [id number] Chief Complaint  Patient presents with  . Lumbar Back Pain    follow-up from [facility] 7/24 pt states he still have the same pain was given a steroid shot 1 week ago and feel that is not working.     HPI Had injection 1 week ago - no change for the first couple of days but not it is going back to spasms and spurts rather than relentless  His mother saw Dr. Phineas Real and it was great.   Dr. Veva Holes at [place].    Past Medical History:  Diagnosis Date  . Allergy   . Anxiety   . Depression   . Gastroparesis   . Heart murmur   . Migraine headache   . Mitral valve prolapse    History reviewed. No pertinent surgical history. Current Outpatient Medications on File Prior to Visit  Medication Sig Dispense Refill  . butalbital-acetaminophen-caffeine (FIORICET, ESGIC) 50-325-40 MG tablet Take 1 tablet by mouth every 4 (four) hours as needed for headache. 60 tablet 1  . cyclobenzaprine (FLEXERIL) 10 MG tablet Take 1 tablet (10 mg total) by mouth 3 (three) times daily as needed for muscle spasms. 90 tablet 3  . diltiazem (CARDIZEM CD) 120 MG 24 hr capsule Take 1 capsule (120 mg total) by mouth daily. 30 capsule 11  . diltiazem (CARDIZEM) 30 MG tablet Take 1 tablet (30 mg total) by mouth as needed. TAKE FOR PALPITATIONS 30 tablet 11  . fluticasone (FLONASE) 50 MCG/ACT nasal spray Place 2 sprays daily into both nostrils. 16 g 6  . methocarbamol (ROBAXIN) 500 MG tablet Take 1 tablet (500 mg total) by mouth 4 (four) times daily. For muscle spasm 60 tablet 1  . ondansetron (ZOFRAN) 4 MG tablet Take by mouth.    . promethazine (PHENERGAN) 12.5 MG tablet Take 1 tablet (12.5 mg total) by mouth every 8 (eight) hours as needed (migraine headache). 20 tablet 0  . Vitamin D, Ergocalciferol, (DRISDOL) 50000 units CAPS capsule Take 1 capsule (50,000 Units total) by mouth every 7 (seven) days. 12 capsule 1   No  current facility-administered medications on file prior to visit.    Allergies  Allergen Reactions  . Erythromycin Anaphylaxis and Nausea And Vomiting  . Nalbuphine Other (See Comments)    States loss of consciousness   . Cefdinir Diarrhea, Nausea And Vomiting and Rash  . Cephalexin Diarrhea and Nausea And Vomiting  . Cephalosporins Diarrhea and Nausea And Vomiting  . Hydrocodone-Acetaminophen Nausea And Vomiting  . Ketorolac Nausea And Vomiting  . Prochlorperazine Edisylate Other (See Comments)    uncontrollable shake - tremor   . Restasis [Cyclosporine] Other (See Comments)    migraines  . Tyloxapol Swelling  . Propranolol Diarrhea  . Topamax [Topiramate] Diarrhea and Nausea And Vomiting  . Zetia [Ezetimibe] Diarrhea  . Doxycycline Hyclate Nausea Only and Other (See Comments)    abd pain  . Iodinated Diagnostic Agents Other (See Comments)    Lightheaded, flushed feeling (explained to patient these are normal side effects of IV iodinated contrast, not allergies, but he wanted this noted in the epic; he tolerated epidural steroid injections w/ contrast w/o difficulty)  . Ketoconazole Itching  . Latex Itching  . Neosporin [Neomycin-Bacitracin Zn-Polymyx] Rash  . Oxycodone-Acetaminophen Other (See Comments)    Tylox caused constipation  . Prednisone Nausea And Vomiting    Oral route, only  Family History  Problem Relation Age of Onset  . Hypertension Mother   . Hypertension Brother    Social History   Socioeconomic History  . Marital status: Married    Spouse name: Not on file  . Number of children: Not on file  . Years of education: Not on file  . Highest education level: Not on file  Occupational History  . Not on file  Social Needs  . Financial resource strain: Not on file  . Food insecurity:    Worry: Not on file    Inability: Not on file  . Transportation needs:    Medical: Not on file    Non-medical: Not on file  Tobacco Use  . Smoking status: Never  Smoker  . Smokeless tobacco: Never Used  Substance and Sexual Activity  . Alcohol use: No    Alcohol/week: 0.0 standard drinks  . Drug use: No  . Sexual activity: Yes    Partners: Female  Lifestyle  . Physical activity:    Days per week: Not on file    Minutes per session: Not on file  . Stress: Not on file  Relationships  . Social connections:    Talks on phone: Not on file    Gets together: Not on file    Attends religious service: Not on file    Active member of club or organization: Not on file    Attends meetings of clubs or organizations: Not on file    Relationship status: Not on file  Other Topics Concern  . Not on file  Social History Narrative  . Not on file   Depression screen [facility] 2/9 12/05/2017 11/20/2017 11/01/2017 10/16/2017 09/19/2017  Decreased Interest 0 0 0 0 0  Down, Depressed, Hopeless 0 0 0 0 0  PHQ - 2 Score 0 0 0 0 0  Altered sleeping - - - - -  Tired, decreased energy - - - - -  Change in appetite - - - - -  Feeling bad or failure about yourself  - - - - -  Trouble concentrating - - - - -  Moving slowly or fidgety/restless - - - - -  Suicidal thoughts - - - - -  PHQ-9 Score - - - - -  Some recent data might be hidden     Review of Systems See hpi    Objective:   Physical Exam  Constitutional: He is oriented to person, place, and time. He appears well-developed and well-nourished. No distress.  HENT:  Head: Normocephalic and atraumatic.  Eyes: Pupils are equal, round, and reactive to light. Conjunctivae are normal. No scleral icterus.  Neck: Normal range of motion. Neck supple. No thyromegaly present.  Cardiovascular: Normal rate, regular rhythm, normal heart sounds and intact distal pulses.  Pulmonary/Chest: Effort normal and breath sounds normal. No respiratory distress.  Musculoskeletal: He exhibits no edema.  Lymphadenopathy:    He has no cervical adenopathy.  Neurological: He is alert and oriented to person, place, and time.  Skin: Skin  is warm and dry. He is not diaphoretic.  Psychiatric: He has a normal mood and affect. His behavior is normal.          Assessment & Plan:   1. Left lumbar radiculopathy   2. Chronic pelvic pain in male   3. Pelvic floor dysfunction   4. Lumbar back pain with radiculopathy affecting left lower extremity   5. Other intervertebral disc degeneration, lumbar region   6. Isolated proteinuria without  specific morphologic lesion     Orders Placed This Encounter  Procedures  . Protein / Creatinine Ratio, Urine  . Protein,Total,Urine  . Ambulatory referral to Physical Therapy    Referral Priority:   Routine    Referral Type:   Physical Medicine    Referral Reason:   Specialty Services Required    Requested Specialty:   Physical Therapy    Number of Visits Requested:   1  . POCT urinalysis dipstick    [name], MD, MPH Primary Care at [facility] [place], [place]  [address] [phone] Office phone  [phone] Office fax   02/16/18 4:12 AM

## 2017-12-09 ENCOUNTER — Encounter: Payer: Self-pay | Admitting: Family Medicine

## 2017-12-09 ENCOUNTER — Ambulatory Visit (INDEPENDENT_AMBULATORY_CARE_PROVIDER_SITE_OTHER): Payer: PPO | Admitting: Family Medicine

## 2017-12-09 VITALS — BP 116/82 | HR 99 | Ht 71.0 in | Wt 146.0 lb

## 2017-12-09 DIAGNOSIS — M5416 Radiculopathy, lumbar region: Secondary | ICD-10-CM

## 2017-12-09 MED ORDER — GABAPENTIN 300 MG PO CAPS: ORAL_CAPSULE | ORAL | 3 refills | Status: DC

## 2017-12-09 NOTE — Progress Notes (Signed)
[name] [place], [place] [address] Phone: [phone] Subjective:     CC: Back pain follow-up  HDQ:[id number]  William [name] is a 57 y.o. male coming in with complaint of back pain. He was in 8/10 pain before the epidural and is now 4/10.  Patient was found to have bilateral foraminal stenosis at L5-S1 as well as disc protrusion at L4-L5.  Patient undergone an epidural continues to have tingling and numbness in left leg and foot. He is using gabapentin at night to sleep. Numbness is worst in the morning when he arises.     Past Medical History:  Diagnosis Date  . Allergy   . Anxiety   . Depression   . Gastroparesis   . Heart murmur   . Migraine headache   . Mitral valve prolapse    No past surgical history on file. Social History   Socioeconomic History  . Marital status: Married    Spouse name: Not on file  . Number of children: Not on file  . Years of education: Not on file  . Highest education level: Not on file  Occupational History  . Not on file  Social Needs  . Financial resource strain: Not on file  . Food insecurity:    Worry: Not on file    Inability: Not on file  . Transportation needs:    Medical: Not on file    Non-medical: Not on file  Tobacco Use  . Smoking status: Never Smoker  . Smokeless tobacco: Never Used  Substance and Sexual Activity  . Alcohol use: No    Alcohol/week: 0.0 standard drinks  . Drug use: No  . Sexual activity: Yes    Partners: Female  Lifestyle  . Physical activity:    Days per week: Not on file    Minutes per session: Not on file  . Stress: Not on file  Relationships  . Social connections:    Talks on phone: Not on file    Gets together: Not on file    Attends religious service: Not on file    Active member of club or organization: Not on file    Attends meetings of clubs or organizations: Not on file    Relationship status: Not on file  Other Topics Concern  .  Not on file  Social History Narrative  . Not on file   Allergies  Allergen Reactions  . Erythromycin Nausea And Vomiting and Anaphylaxis  . Hydrocodone-Acetaminophen Nausea And Vomiting  . Prochlorperazine Edisylate     uncontrollable shake - tremor Other reaction(s): Other Shakes  . Tyloxapol Swelling  . Cephalexin Diarrhea and Nausea And Vomiting  . Cephalosporins Diarrhea and Nausea And Vomiting  . Ketoconazole Itching  . Ketorolac Nausea And Vomiting  . Latex Itching    REACTION: Reaction not known  . Nalbuphine Other (See Comments)    States loss of consciousness REACTION: Reaction not known  . Oxycodone-Acetaminophen Other (See Comments) and Swelling  . Prednisone Nausea And Vomiting  . Propranolol Diarrhea  . Restasis [Cyclosporine] Other (See Comments)    migraines  . Topamax [Topiramate] Diarrhea and Nausea And Vomiting  . Zetia [Ezetimibe] Diarrhea  . Cefdinir Diarrhea, Nausea And Vomiting and Rash  . Doxycycline Hyclate Nausea Only and Other (See Comments)    abd pain  . Neosporin [Neomycin-Bacitracin Zn-Polymyx] Rash   Family History  Problem Relation Age of Onset  . Hypertension Mother   . Hypertension Brother  Past medical history, social, surgical and family history all reviewed in electronic medical record.  No pertanent information unless stated regarding to the chief complaint.   Review of Systems:Review of systems updated and as accurate as of 12/09/17  No headache, visual changes, nausea, vomiting, diarrhea, constipation, dizziness, abdominal pain, skin rash, fevers, chills, night sweats, weight loss, swollen lymph nodes, body aches, joint swelling,  chest pain, shortness of breath, mood changes.  Positive muscle aches  Objective  Blood pressure 116/82, pulse 99, height 5\' 11"  (1.803 m), weight 146 lb (66.2 kg), SpO2 97 %. Systems examined below as of 12/09/17   General: No apparent distress alert and oriented x3 mood and affect normal,  dressed appropriately.  HEENT: Pupils equal, extraocular movements intact  Respiratory: Patient's speak in full sentences and does not appear short of breath  Cardiovascular: No lower extremity edema, non tender, no erythema  Skin: Warm dry intact with no signs of infection or rash on extremities or on axial skeleton.  Abdomen: Soft nontender  Neuro: Cranial nerves II through XII are intact, neurovascularly intact in all extremities with 2+ DTRs and 2+ pulses.  Lymph: No lymphadenopathy of posterior or anterior cervical chain or axillae bilaterally.  Gait normal with good balance and coordination.  MSK:  Non tender with full range of motion and good stability and symmetric strength and tone of shoulders, elbows, wrist, hip, knee and ankles bilaterally.  Back Exam:  Inspection: Loss of lordosis Motion: Flexion 45 deg, Extension 25 deg, Side Bending to 35 deg bilaterally,  Rotation to 35 deg bilaterally  SLR laying: Mild positive left XSLR laying: Negative  Palpable tenderness: Tenderness to palpation the paraspinal musculature lumbar spine left greater than right. FABER: Positive bilateral. Sensory change: Gross sensation intact to all lumbar and sacral dermatomes.  Reflexes: 2+ at both patellar tendons, 2+ at achilles tendons, Babinski's downgoing.  Strength at foot  Plantar-flexion: 5/5 Dorsi-flexion: 5/5 Eversion: 5/5 Inversion: 5/5  Leg strength  Quad: 5/5 Hamstring: 5/5 Hip flexor: 5/5 Hip abductors: 4/5 but symmetric Gait unremarkable.     Impression and Recommendations:     This case required medical decision making of moderate complexity.      Note: This dictation was prepared with Dragon dictation along with smaller phrase technology. Any transcriptional errors that result from this process are unintentional.

## 2017-12-09 NOTE — Patient Instructions (Signed)
Good to see you  We order the epidural.  Please call them [phone] to have it scheduled.  Gabapentin 300mg  at night After you have the epdural lets get you in PT 1-2 weeks after that and they should call you  See me again in 4 weeks or so just because we should have all that in play but call us at [phone] if you need [place] sooner.

## 2017-12-09 NOTE — Assessment & Plan Note (Signed)
Improvement but patient still has a positive straight leg test.  Patient has made about a 50% improvement.  Encourage patient to continue the home exercises and we will repeat epidural.  Increase gabapentin to 300 mg.  Patient will also be referred to formal physical therapy for after the epidural to start increasing activity as tolerated.  Follow-up with me again in 4 weeks.  Spent  25 minutes with patient face-to-face and had greater than 50% of counseling including as described above in assessment and plan.

## 2017-12-13 DIAGNOSIS — S39012D Strain of muscle, fascia and tendon of lower back, subsequent encounter: Secondary | ICD-10-CM | POA: Diagnosis not present

## 2017-12-17 ENCOUNTER — Ambulatory Visit: Payer: PPO | Admitting: Physician Assistant

## 2017-12-17 DIAGNOSIS — H10022 Other mucopurulent conjunctivitis, left eye: Secondary | ICD-10-CM | POA: Diagnosis not present

## 2017-12-18 ENCOUNTER — Telehealth: Payer: Self-pay | Admitting: *Deleted

## 2017-12-18 NOTE — Telephone Encounter (Signed)
Discussed with pt

## 2017-12-18 NOTE — Telephone Encounter (Signed)
Pt called stating that he is scheduled to have his epidural on Friday 8.23.19. He was diagnosed yesterday with pink eye & was prescribed an eye drop abx. Pt would like to know if it is still okay for him to have his epidural on Friday.

## 2017-12-18 NOTE — Telephone Encounter (Signed)
Yes - that would be fine

## 2017-12-20 ENCOUNTER — Other Ambulatory Visit: Payer: Self-pay | Admitting: Family Medicine

## 2017-12-20 ENCOUNTER — Ambulatory Visit
Admission: RE | Admit: 2017-12-20 | Discharge: 2017-12-20 | Disposition: A | Discharge status: Home / Self Care | Payer: PPO | Source: Ambulatory Visit | Attending: Family Medicine | Admitting: Family Medicine

## 2017-12-20 DIAGNOSIS — M5416 Radiculopathy, lumbar region: Secondary | ICD-10-CM

## 2017-12-20 DIAGNOSIS — M47817 Spondylosis without myelopathy or radiculopathy, lumbosacral region: Secondary | ICD-10-CM | POA: Diagnosis not present

## 2017-12-20 MED ORDER — DIPHENHYDRAMINE HCL 50 MG PO CAPS
50.0000 mg | ORAL_CAPSULE | Freq: Once | ORAL | Status: AC
  Administered 2017-12-20: 50 mg via ORAL

## 2017-12-20 MED ORDER — IOPAMIDOL (ISOVUE-M 200) INJECTION 41%
1.0000 mL | Freq: Once | INTRAMUSCULAR | Status: AC
  Administered 2017-12-20: 1 mL via EPIDURAL

## 2017-12-20 MED ORDER — METHYLPREDNISOLONE ACETATE 40 MG/ML INJ SUSP (RADIOLOG
120.0000 mg | Freq: Once | INTRAMUSCULAR | Status: AC
  Administered 2017-12-20: 120 mg via EPIDURAL

## 2017-12-20 NOTE — Discharge Instructions (Signed)

## 2017-12-25 DIAGNOSIS — R509 Fever, unspecified: Secondary | ICD-10-CM | POA: Diagnosis not present

## 2017-12-31 ENCOUNTER — Ambulatory Visit: Payer: PPO | Attending: Family Medicine | Admitting: Physical Therapy

## 2017-12-31 ENCOUNTER — Other Ambulatory Visit: Payer: Self-pay

## 2017-12-31 ENCOUNTER — Encounter: Payer: Self-pay | Admitting: Physical Therapy

## 2017-12-31 DIAGNOSIS — M25552 Pain in left hip: Secondary | ICD-10-CM | POA: Insufficient documentation

## 2017-12-31 DIAGNOSIS — R293 Abnormal posture: Secondary | ICD-10-CM | POA: Diagnosis not present

## 2017-12-31 DIAGNOSIS — M545 Low back pain: Secondary | ICD-10-CM | POA: Insufficient documentation

## 2017-12-31 DIAGNOSIS — M62838 Other muscle spasm: Secondary | ICD-10-CM | POA: Insufficient documentation

## 2017-12-31 DIAGNOSIS — M6281 Muscle weakness (generalized): Secondary | ICD-10-CM | POA: Diagnosis not present

## 2017-12-31 NOTE — Patient Instructions (Signed)
Access Code: [email or web address]  URL: [email or web address]  Date: 12/31/2017  Prepared by: Elly Modena   Exercises  Sidelying Thoracic Rotation - 10 reps - 2-3x daily - 7x weekly    [facility] [address] [place], [place] [address] Phone # [phone] Fax [phone]

## 2017-12-31 NOTE — Therapy (Signed)
[facility] [address], [place], [name], [address] Phone: [phone]   Fax:  [phone]  Physical Therapy Evaluation  Patient Details  Name: William [name] MRN: [id number] Date of Birth: [birth year] Referring Provider: Hulan Saas, DO    Encounter Date: 12/31/2017  PT End of Session - 12/31/17 1151    Visit Number  1    Date for PT Re-Evaluation  02/14/18    Authorization Time Period  01/27/18 to 02/14/18    Authorization - Visit Number  1    Authorization - Number of Visits  10    PT Start Time  1100    PT Stop Time  1149    PT Time Calculation (min)  49 min    Activity Tolerance  Patient tolerated treatment well    Behavior During Therapy  [facility] for tasks assessed/performed       Past Medical History:  Diagnosis Date  . Allergy   . Anxiety   . Depression   . Gastroparesis   . Heart murmur   . Migraine headache   . Mitral valve prolapse     History reviewed. No pertinent surgical history.  There were no vitals filed for this visit.   Subjective Assessment - 12/31/17 1104    Subjective  Pt reports having on and off back pain for quite a while but recently the past 3 months his pain has been really bad. He had 2 injections but the 2nd one didn't seem to make much difference. He has not been completing what he was given by his referring physician because they are too painful. Ice seems to help some. He will sometimes have the feeling as if his knee wants to buckle.     Pertinent History  anxiety, depression, migraines    Limitations  Lifting    How long can you sit comfortably?  15 minutes     Patient Stated Goals  decrease pain     Currently in Pain?  Yes    Pain Score  7     Pain Location  Back    Pain Orientation  Left;Posterior    Pain Descriptors / Indicators  Constant    Pain Type  Chronic pain    Pain Radiating Towards  sometimes shooting down the lateral thigh and skips the calf down  to the bottom of the foot.     Pain Onset  More than a month ago    Pain Frequency  Constant    Aggravating Factors   bending over in the tub, sitting for long periods     Pain Relieving Factors  standing preferred     Effect of Pain on Daily Activities  unable to lift, currently helping care for his mother          [facility] PT Assessment - 12/31/17 0001      Assessment   Medical Diagnosis  Lumbar radiculopathy    Referring Provider  Hulan Saas, DO     Next MD Visit  might be going back soon     Prior Therapy  Pelvic floor PT      Precautions   Precautions  None      Restrictions   Weight Bearing Restrictions  No      Balance Screen   Has the patient fallen in the past 6 months  No    Has the patient had a decrease in activity level because of a fear of falling?   No  Is the patient reluctant to leave their home because of a fear of falling?   No      Prior Function   Leisure  currently his mother lives with him       Cognition   Overall Cognitive Status  Within Functional Limits for tasks assessed      Observation/Other Assessments   Observations  sitting slouched in chair       Posture/Postural Control   Posture Comments  forward head, rounded shoulders       ROM / Strength   AROM / PROM / Strength  AROM;Strength      AROM   AROM Assessment Site  Lumbar    Lumbar Flexion  repeated flexion in standing x10 reps, no change     Lumbar Extension  repeated extension in standing x10 reps, no change     Lumbar - Right Rotation  limited, pain free    Lumbar - Left Rotation  limited, painful (Lt mid back)       Strength   Strength Assessment Site  Hip;Knee;Ankle    Right/Left Hip  Right;Left    Right Hip Flexion  5/5    Right Hip Extension  5/5    Right Hip ABduction  4/5    Left Hip Flexion  5/5    Left Hip Extension  5/5    Left Hip ABduction  4/5   (+) pain over lateral thigh   Right/Left Knee  Right;Left    Right Knee Flexion  5/5    Right Knee Extension   5/5    Left Knee Flexion  5/5    Left Knee Extension  5/5    Right/Left Ankle  Right;Left    Right Ankle Dorsiflexion  5/5    Left Ankle Dorsiflexion  5/5      Flexibility   Soft Tissue Assessment /Muscle Length  yes    Quadriceps  WNL      Palpation   Spinal mobility  tenderness with spine mobility testing of lumbar and thoracic spine     Palpation comment  tenderness along Lt thoracolumbar paraspinals, lateral hamstring/quad with ITB, Lt TFL/glutes      Special Tests   Other special tests  (-) slump test Lt and Rt                 Objective measurements completed on examination: See above findings.      [facility] Adult PT Treatment/Exercise - 12/31/17 0001      Self-Care   Self-Care  Heat/Ice Application;Posture    Posture  sleeping with pillows propped to avoid direct pressure; seated with lumbar roll    Heat/Ice Application  ice parameters for hip       Exercises   Exercises  Knee/Hip      Knee/Hip Exercises: Sidelying   Other Sidelying Knee/Hip Exercises  thoracic rotation Lt/Rt x10 reps each side             PT Education - 12/31/17 1150    Education Details  eval findings/POC; posture/sleeping position; ice application; implemented HEP    Person(s) Educated  Patient    Methods  Explanation;Verbal cues;Handout    Comprehension  Verbalized understanding;Returned demonstration       PT Short Term Goals - 12/31/17 1202      PT SHORT TERM GOAL #1   Title  Pt will be independent with his initial HEP to improve ROM, strength and body mechanics.     Time  3  Period  Weeks    Status  New    Target Date  01/21/18      PT SHORT TERM GOAL #2   Title  Pt will demo improved posture with sitting and consistent use of lumbar roll for additional back support throughout the day.     Time  3    Period  Weeks    Status  New      PT SHORT TERM GOAL #3   Title  Pt will report making proper sleeping adjustments to decrease strain on the Lt hip throughout the  night.     Time  3    Period  Weeks    Status  New      PT SHORT TERM GOAL #4   Title  Pt will demo good understanding of proper posture and mechanics with lifting a small object (less than 5 lb) from the floor, 2/3 trials without PT cuing.     Time  3    Period  Weeks    Status  New        PT Long Term Goals - 12/31/17 1205      PT LONG TERM GOAL #1   Title  Pt will be independent with his advanced HEP to improve flexibility, strength and mechanics with daily activity after d/c from PT.     Time  6    Period  Weeks    Status  New    Target Date  02/14/18      PT LONG TERM GOAL #2   Title  Pt will report  atleast 75% improvement in his pain with sleeping through the night.     Time  6    Status  New      PT LONG TERM GOAL #3   Title  Pt will report being able to sit for atleast 30 minutes without increase in pain, which will allow him to drive around town without limitation.     Time  6    Period  Weeks    Status  New      PT LONG TERM GOAL #4   Title  Pt will be able to lift 10 lb box from the floor with proper mechanics and without increase in hip/back pain, 2/3 trials.     Time  6    Period  Weeks    Status  New             Plan - 12/31/17 1152    Clinical Impression Statement  Pt is a pleasant 57 y.o M referred to OPPT with complaints of Lt sided low back pain and Lt hip pain down to the lateral knee onset several months ago. Pt also has intermittent numbness reported on the sole of his Lt foot with prolonged standing/walking. He had some improvements in his pain with prone on elbows stretch, but repeated extension and flexion in standing remained primarily unchanged. Pt's pain was onset in his back following lifting heavy packages and his hip started some time after this. Overall, he has poor posture awareness and slouched sitting posture during today's evaluation. Slump testing is negative and his strength is grossly 5/5 MMT except for hip abduction 4/5  bilaterally. He is tender along the Lt thoracic/lumbar parapsinals as well as the Lt greater trochanter and surrounding Lt glutes, TFL and ITB. at this time, his pain in his hip appears to be related to compensations made following his mid back strain and developed over time. He  would benefit from skilled PT to educate on proper lifting mechanics, provide sleeping adjustments and improve his flexibility and muscle spasm to ensure he is able to provide care for his mother.    History and Personal Factors relevant to plan of care:  anxiety and depression    Clinical Presentation  Evolving    Clinical Presentation due to:  worsening over time    Clinical Decision Making  Low    Rehab Potential  Good    PT Frequency  2x / week    PT Duration  6 weeks    PT Treatment/Interventions  ADLs/Self Care Home Management;Cryotherapy;Electrical Stimulation;Moist Heat;Therapeutic exercise;Patient/family education;Manual techniques;Passive range of motion;Taping;Dry needling;Neuromuscular re-education;Functional mobility training;Therapeutic activities    PT Next Visit Plan  f/u on sleeping adjustments/sitting with lumbar roll; DN Lt glutes, TFL, lumbar/thoracic paraspinals; gentle hip stretches and lumbar mobility     PT Home Exercise Plan  [email or web address]    Consulted and Agree with Plan of Care  Patient       Patient will benefit from skilled therapeutic intervention in order to improve the following deficits and impairments:  Decreased activity tolerance, Decreased strength, Impaired flexibility, Postural dysfunction, Pain, Improper body mechanics, Decreased scar mobility, Decreased range of motion, Hypomobility, Increased muscle spasms  Visit Diagnosis: Other muscle spasm  Pain in left hip  Left low back pain, unspecified chronicity, with sciatica presence unspecified  Abnormal posture     Problem List Patient Active Problem List   Diagnosis Date Noted  . Left lumbar radiculopathy 10/10/2017  .  Migraine without aura and without status migrainosus, not intractable 06/22/2017  . Vitamin D deficiency 05/28/2017  . Scrotal pain 05/28/2017  . Paroxysmal SVT (supraventricular tachycardia) ([place]) 05/28/2017  . Mild left inguinal hernia 11/23/2016  . Chronic pelvic pain in male 09/28/2016  . Abdominal pain, left lower quadrant 09/28/2016  . Isolated proteinuria without specific morphologic lesion 06/27/2016  . Left testicular pain 06/27/2016  . Acute left flank pain 06/27/2016  . Epididymitis, left 06/05/2016  . Pelvic floor dysfunction 04/01/2016  . GERD (gastroesophageal reflux disease) 10/13/2015  . Gastroparesis 10/13/2015  . Intractable chronic migraine without aura and without status migrainosus 12/15/2013  . Hyperlipidemia 04/04/2012  . Mitral valve disease 07/15/2009  . IRRITABLE BOWEL SYNDROME 07/15/2009  . Palpitations 06/28/2009    12:15 PM,12/31/17 Sherol Dade PT, DPT [facility] [address], [place], [name], [address] Phone: [phone]   Fax:  [phone]  Name: William [name] MRN: [id number] Date of Birth: 1960/11/27

## 2018-01-02 ENCOUNTER — Encounter: Payer: PPO | Admitting: Physical Therapy

## 2018-01-07 ENCOUNTER — Encounter: Payer: Self-pay | Admitting: Physical Therapy

## 2018-01-07 ENCOUNTER — Ambulatory Visit: Payer: PPO | Admitting: Physical Therapy

## 2018-01-07 DIAGNOSIS — M62838 Other muscle spasm: Secondary | ICD-10-CM | POA: Diagnosis not present

## 2018-01-07 DIAGNOSIS — M25552 Pain in left hip: Secondary | ICD-10-CM

## 2018-01-07 DIAGNOSIS — M545 Low back pain: Secondary | ICD-10-CM

## 2018-01-07 DIAGNOSIS — R293 Abnormal posture: Secondary | ICD-10-CM

## 2018-01-07 NOTE — Patient Instructions (Signed)
Access Code: [email or web address]  URL: [email or web address]  Date: 01/07/2018  Prepared by: Elly Modena   Exercises  Sidelying Thoracic Rotation - 10 reps - 2-3x daily - 7x weekly  Supine Gluteus Stretch - 3 sets - 30 hold - 1x daily - 7x weekly  Supine Double Knee to Chest - 3 sets - 30 hold - 1x daily - 7x weekly  Supine Figure 4 Piriformis Stretch - 3 sets - 30 hold - 1x daily - 7x weekly    [facility] [address] [place], [place] [address] Phone # [phone] Fax [phone]

## 2018-01-07 NOTE — Therapy (Signed)
[facility] [address], [place], [name], [address] Phone: [phone]   Fax:  [phone]  Physical Therapy Treatment  Patient Details  Name: William [name] MRN: [id number] Date of Birth: 1960/12/10 Referring Provider: Hulan Saas, DO    Encounter Date: 01/07/2018  PT End of Session - 01/07/18 1036    Visit Number  2    Date for PT Re-Evaluation  02/14/18    Authorization Time Period  01/27/18 to 02/14/18    Authorization - Visit Number  2    Authorization - Number of Visits  10    PT Start Time  9518    PT Stop Time  1056    PT Time Calculation (min)  40 min    Activity Tolerance  Patient tolerated treatment well;No increased pain    Behavior During Therapy  WFL for tasks assessed/performed       Past Medical History:  Diagnosis Date  . Allergy   . Anxiety   . Depression   . Gastroparesis   . Heart murmur   . Migraine headache   . Mitral valve prolapse     History reviewed. No pertinent surgical history.  There were no vitals filed for this visit.  Subjective Assessment - 01/07/18 1025    Subjective  Pt states that the pillow at night has made a big difference with his sleep. He also is able to sit better with the lumbar support. He has some mid back pain currently, as well as numbness/tingling in the bottom of his foot.     Pertinent History  anxiety, depression, migraines    Limitations  Lifting    How long can you sit comfortably?  15 minutes     Patient Stated Goals  decrease pain     Currently in Pain?  Yes    Pain Score  5     Pain Location  Back    Pain Orientation  Left;Posterior;Mid    Pain Descriptors / Indicators  Constant    Pain Type  Chronic pain    Pain Radiating Towards  sometimes down lateral thigh and to bottom of foot    Pain Onset  More than a month ago    Pain Frequency  Constant    Aggravating Factors   bending over, sitting for long periods    Pain Relieving  Factors  standing preferred                       [facility] Adult PT Treatment/Exercise - 01/07/18 0001      Exercises   Exercises  Knee/Hip      Knee/Hip Exercises: Stretches   Piriformis Stretch  Left;3 reps;20 seconds    Piriformis Stretch Limitations  supine     Other Knee/Hip Stretches  B knees to chest stretch 3x30 sec    Other Knee/Hip Stretches  Lt and Rt glute stretch 3x20 sec each; prone on elbows stretch x3 min      Manual Therapy   Manual Therapy  Soft tissue mobilization    Soft tissue mobilization  STM Lt gluteals and thoracic/lumbar paraspinals, QL       Trigger Point Dry Needling - 01/07/18 1108    Consent Given?  Yes    Education Handout Provided  Yes   last session   Muscles Treated Lower Body  Gluteus minimus;Gluteus maximus    Gluteus Maximus Response  Twitch response elicited;Palpable increased muscle length    Gluteus  Minimus Response  Twitch response elicited;Palpable increased muscle length           PT Education - 01/07/18 1717    Education Details  technique with HEP, updated HEP    Person(s) Educated  Patient    Methods  Explanation;Verbal cues;Handout    Comprehension  Verbalized understanding;Returned demonstration       PT Short Term Goals - 12/31/17 1202      PT SHORT TERM GOAL #1   Title  Pt will be independent with his initial HEP to improve ROM, strength and body mechanics.     Time  3    Period  Weeks    Status  New    Target Date  01/21/18      PT SHORT TERM GOAL #2   Title  Pt will demo improved posture with sitting and consistent use of lumbar roll for additional back support throughout the day.     Time  3    Period  Weeks    Status  New      PT SHORT TERM GOAL #3   Title  Pt will report making proper sleeping adjustments to decrease strain on the Lt hip throughout the night.     Time  3    Period  Weeks    Status  New      PT SHORT TERM GOAL #4   Title  Pt will demo good understanding of proper posture  and mechanics with lifting a small object (less than 5 lb) from the floor, 2/3 trials without PT cuing.     Time  3    Period  Weeks    Status  New        PT Long Term Goals - 12/31/17 1205      PT LONG TERM GOAL #1   Title  Pt will be independent with his advanced HEP to improve flexibility, strength and mechanics with daily activity after d/c from PT.     Time  6    Period  Weeks    Status  New    Target Date  02/14/18      PT LONG TERM GOAL #2   Title  Pt will report  atleast 75% improvement in his pain with sleeping through the night.     Time  6    Status  New      PT LONG TERM GOAL #3   Title  Pt will report being able to sit for atleast 30 minutes without increase in pain, which will allow him to drive around town without limitation.     Time  6    Period  Weeks    Status  New      PT LONG TERM GOAL #4   Title  Pt will be able to lift 10 lb box from the floor with proper mechanics and without increase in hip/back pain, 2/3 trials.     Time  6    Period  Weeks    Status  New            Plan - 01/07/18 1717    Clinical Impression Statement  Pt has noted improvements in both his sleeping and sitting tolerance after making changes discussed at his last visit. He did arrive with Lt sided mid back pain this session. Hip flexibility exercises were introduced and pt was able to demonstrate good understanding of these HEP additions.  Pt was agreeable to dry needling treatment so this was  completed to the gluteals. Also completed soft tissue mobilization to the Lt hip and back region. Ended session without reported increase in pain.     Rehab Potential  Good    PT Frequency  2x / week    PT Duration  6 weeks    PT Treatment/Interventions  ADLs/Self Care Home Management;Cryotherapy;Electrical Stimulation;Moist Heat;Therapeutic exercise;Patient/family education;Manual techniques;Passive range of motion;Taping;Dry needling;Neuromuscular re-education;Functional mobility  training;Therapeutic activities    PT Next Visit Plan  f/u on DN Lt glutes, and D/N TFL, lumbar/thoracic paraspinals if needed; gentle hip stretches and lumbar mobility     PT Home Exercise Plan  [email or web address]    Consulted and Agree with Plan of Care  Patient       Patient will benefit from skilled therapeutic intervention in order to improve the following deficits and impairments:  Decreased activity tolerance, Decreased strength, Impaired flexibility, Postural dysfunction, Pain, Improper body mechanics, Decreased scar mobility, Decreased range of motion, Hypomobility, Increased muscle spasms  Visit Diagnosis: Other muscle spasm  Pain in left hip  Left low back pain, unspecified chronicity, with sciatica presence unspecified  Abnormal posture     Problem List Patient Active Problem List   Diagnosis Date Noted  . Left lumbar radiculopathy 10/10/2017  . Migraine without aura and without status migrainosus, not intractable 06/22/2017  . Vitamin D deficiency 05/28/2017  . Scrotal pain 05/28/2017  . Paroxysmal SVT (supraventricular tachycardia) ([place]) 05/28/2017  . Mild left inguinal hernia 11/23/2016  . Chronic pelvic pain in male 09/28/2016  . Abdominal pain, left lower quadrant 09/28/2016  . Isolated proteinuria without specific morphologic lesion 06/27/2016  . Left testicular pain 06/27/2016  . Acute left flank pain 06/27/2016  . Epididymitis, left 06/05/2016  . Pelvic floor dysfunction 04/01/2016  . GERD (gastroesophageal reflux disease) 10/13/2015  . Gastroparesis 10/13/2015  . Intractable chronic migraine without aura and without status migrainosus 12/15/2013  . Hyperlipidemia 04/04/2012  . Mitral valve disease 07/15/2009  . IRRITABLE BOWEL SYNDROME 07/15/2009  . Palpitations 06/28/2009   5:23 PM,01/07/18 Sherol Dade PT, DPT [facility] [address], [place], [name], [address] Phone: [phone]   Fax:  [phone]  Name: William [name] MRN: [id number] Date of Birth: 1960-11-07

## 2018-01-10 ENCOUNTER — Encounter

## 2018-01-14 ENCOUNTER — Ambulatory Visit: Payer: PPO | Admitting: Physical Therapy

## 2018-01-15 ENCOUNTER — Ambulatory Visit: Payer: PPO | Admitting: Family Medicine

## 2018-01-17 ENCOUNTER — Ambulatory Visit: Payer: PPO | Admitting: Family Medicine

## 2018-01-22 ENCOUNTER — Encounter

## 2018-01-23 ENCOUNTER — Encounter: Payer: Self-pay | Admitting: Physical Therapy

## 2018-01-23 ENCOUNTER — Ambulatory Visit: Payer: PPO | Admitting: Physical Therapy

## 2018-01-23 DIAGNOSIS — M545 Low back pain: Secondary | ICD-10-CM

## 2018-01-23 DIAGNOSIS — M62838 Other muscle spasm: Secondary | ICD-10-CM | POA: Diagnosis not present

## 2018-01-23 DIAGNOSIS — R293 Abnormal posture: Secondary | ICD-10-CM

## 2018-01-23 DIAGNOSIS — M6281 Muscle weakness (generalized): Secondary | ICD-10-CM

## 2018-01-23 DIAGNOSIS — M25552 Pain in left hip: Secondary | ICD-10-CM

## 2018-01-23 NOTE — Therapy (Signed)
[facility] [address] [place], [name], [address] Phone: [phone]   Fax:  [phone]  Physical Therapy Treatment  Patient Details  Name: William [name] MRN: [id number] Date of Birth: [birth year] Referring Provider: Hulan Saas, DO    Encounter Date: 01/23/2018  PT End of Session - 01/23/18 0949    Visit Number  3    Date for PT Re-Evaluation  02/14/18    Authorization Time Period  01/27/18 to 02/14/18    Authorization - Visit Number  3    Authorization - Number of Visits  10    PT Start Time  0931    PT Stop Time  1020    PT Time Calculation (min)  49 min    Activity Tolerance  Patient tolerated treatment well;No increased pain    Behavior During Therapy  WFL for tasks assessed/performed       Past Medical History:  Diagnosis Date  . Allergy   . Anxiety   . Depression   . Gastroparesis   . Heart murmur   . Migraine headache   . Mitral valve prolapse     History reviewed. No pertinent surgical history.  There were no vitals filed for this visit.  Subjective Assessment - 01/23/18 0932    Subjective  Pt reports that he is still having issues with his mother and wife going to the hospital alot. He is still having low back pain but it is not as bad as his migraines he has been having. He has not been able to keep up with his HEP and is unable to tell for sure if it is making a difference.     Pertinent History  anxiety, depression, migraines    Limitations  Lifting    How long can you sit comfortably?  15 minutes     Patient Stated Goals  decrease pain     Currently in Pain?  No/denies    Pain Onset  More than a month ago                       [facility] Adult PT Treatment/Exercise - 01/23/18 0001      Exercises   Exercises  Knee/Hip      Knee/Hip Exercises: Stretches   Passive Hamstring Stretch  Right;1 rep;20 seconds    Passive Hamstring Stretch Limitations  seated hip  hinge, cues for proper posture     Piriformis Stretch  Right;Left;1 rep;30 seconds    Piriformis Stretch Limitations  seated     Other Knee/Hip Stretches  B knees to chest with LE on green physioball x15 reps (cues to relax shoulders)     Other Knee/Hip Stretches  seated Rt and Lt glute stretch 1x20 sec each       Knee/Hip Exercises: Standing   Other Standing Knee Exercises  standing hip IR/ER with over pressure x15 reps each side       Knee/Hip Exercises: Supine   Other Supine Knee/Hip Exercises  single leg clam with red TB x10 reps, tactile cues for abdominal brace    Other Supine Knee/Hip Exercises  abdominal brace with bent knee 90/90 alternating heel tap x10 reps       Manual Therapy   Soft tissue mobilization  STM Lt mid thoracic parapsinals             PT Education - 01/23/18 0940    Education Details  importance of finding ways to relax at  home; encouraged to pick back up on HEP    Person(s) Educated  Patient    Methods  Explanation;Verbal cues    Comprehension  Returned demonstration;Verbalized understanding       PT Short Term Goals - 01/23/18 1025      PT SHORT TERM GOAL #1   Title  Pt will be independent with his initial HEP to improve ROM, strength and body mechanics.     Time  3    Period  Weeks    Status  On-going      PT SHORT TERM GOAL #2   Title  Pt will demo improved posture with sitting and consistent use of lumbar roll for additional back support throughout the day.     Time  3    Period  Weeks    Status  Partially Met      PT SHORT TERM GOAL #3   Title  Pt will report making proper sleeping adjustments to decrease strain on the Lt hip throughout the night.     Time  3    Period  Weeks    Status  Achieved      PT SHORT TERM GOAL #4   Title  Pt will demo good understanding of proper posture and mechanics with lifting a small object (less than 5 lb) from the floor, 2/3 trials without PT cuing.     Time  3    Period  Weeks    Status  On-going         PT Long Term Goals - 12/31/17 1205      PT LONG TERM GOAL #1   Title  Pt will be independent with his advanced HEP to improve flexibility, strength and mechanics with daily activity after d/c from PT.     Time  6    Period  Weeks    Status  New    Target Date  02/14/18      PT LONG TERM GOAL #2   Title  Pt will report  atleast 75% improvement in his pain with sleeping through the night.     Time  6    Status  New      PT LONG TERM GOAL #3   Title  Pt will report being able to sit for atleast 30 minutes without increase in pain, which will allow him to drive around town without limitation.     Time  6    Period  Weeks    Status  New      PT LONG TERM GOAL #4   Title  Pt will be able to lift 10 lb box from the floor with proper mechanics and without increase in hip/back pain, 2/3 trials.     Time  6    Period  Weeks    Status  New            Plan - 01/23/18 1004    Clinical Impression Statement  Pt arrived without reports of LE symptoms since his last session. His foot numbness has also resolved. He does have some discomfort with trunk rotation and palpable tenderness along his Lt thoracic paraspinals. Completed therex to improve hip ROM and trunk flexibility. Pt reported no increase in pain end of session and demonstrated understanding of HEP updates.     Rehab Potential  Good    PT Frequency  2x / week    PT Duration  6 weeks    PT Treatment/Interventions  ADLs/Self Care Home  Management;Cryotherapy;Electrical Stimulation;Moist Heat;Therapeutic exercise;Patient/family education;Manual techniques;Passive range of motion;Taping;Dry needling;Neuromuscular re-education;Functional mobility training;Therapeutic activities    PT Next Visit Plan  DN lumbar/thoracic paraspinals if needed; gentle hip stretches and lumbar mobility progression    PT Home Exercise Plan  [email or web address]    Consulted and Agree with Plan of Care  Patient       Patient will benefit from skilled  therapeutic intervention in order to improve the following deficits and impairments:  Decreased activity tolerance, Decreased strength, Impaired flexibility, Postural dysfunction, Pain, Improper body mechanics, Decreased scar mobility, Decreased range of motion, Hypomobility, Increased muscle spasms  Visit Diagnosis: Other muscle spasm  Pain in left hip  Left low back pain, unspecified chronicity, with sciatica presence unspecified  Abnormal posture  Muscle weakness (generalized)     Problem List Patient Active Problem List   Diagnosis Date Noted  . Left lumbar radiculopathy 10/10/2017  . Migraine without aura and without status migrainosus, not intractable 06/22/2017  . Vitamin D deficiency 05/28/2017  . Scrotal pain 05/28/2017  . Paroxysmal SVT (supraventricular tachycardia) ([place]) 05/28/2017  . Mild left inguinal hernia 11/23/2016  . Chronic pelvic pain in male 09/28/2016  . Abdominal pain, left lower quadrant 09/28/2016  . Isolated proteinuria without specific morphologic lesion 06/27/2016  . Left testicular pain 06/27/2016  . Acute left flank pain 06/27/2016  . Epididymitis, left 06/05/2016  . Pelvic floor dysfunction 04/01/2016  . GERD (gastroesophageal reflux disease) 10/13/2015  . Gastroparesis 10/13/2015  . Intractable chronic migraine without aura and without status migrainosus 12/15/2013  . Hyperlipidemia 04/04/2012  . Mitral valve disease 07/15/2009  . IRRITABLE BOWEL SYNDROME 07/15/2009  . Palpitations 06/28/2009     10:29 AM,01/23/18 Sherol Dade PT, DPT [place] [address] [place], [name], [address] Phone: [phone]   Fax:  [phone]  Name: William [name] MRN: [id number] Date of Birth: 07/31/1960

## 2018-01-23 NOTE — Patient Instructions (Signed)
Access Code: [email or web address]  URL: [email or web address]  Date: 01/23/2018  Prepared by: Sherol Dade   Exercises  Sidelying Thoracic Rotation - 10 reps - 2-3x daily - 7x weekly  Supine Gluteus Stretch - 3 sets - 30 hold - 1x daily - 7x weekly  Supine Double Knee to Chest - 3 sets - 30 hold - 1x daily - 7x weekly  Seated Gluteal Stretch - 10 reps - 3 sets - 1x daily - 7x weekly  Seated Piriformis Stretch - 3 sets - 30 hold - 1x daily - 7x weekly  Seated Hamstring Stretch - 3 sets - 30 hold - 1x daily - 7x weekly    [facility] [address], [place], [place] [address] Phone # [phone] Fax [phone]

## 2018-01-28 ENCOUNTER — Encounter: Payer: Self-pay | Admitting: Physical Therapy

## 2018-01-28 ENCOUNTER — Ambulatory Visit: Payer: PPO | Attending: Family Medicine | Admitting: Physical Therapy

## 2018-01-28 DIAGNOSIS — M6281 Muscle weakness (generalized): Secondary | ICD-10-CM | POA: Diagnosis not present

## 2018-01-28 DIAGNOSIS — M25552 Pain in left hip: Secondary | ICD-10-CM | POA: Diagnosis not present

## 2018-01-28 DIAGNOSIS — R293 Abnormal posture: Secondary | ICD-10-CM

## 2018-01-28 DIAGNOSIS — M62838 Other muscle spasm: Secondary | ICD-10-CM | POA: Diagnosis not present

## 2018-01-28 NOTE — Therapy (Signed)
[facility] [address] [place], [name], [address] Phone: [phone]   Fax:  [phone]  Physical Therapy Treatment  Patient Details  Name: William [name] MRN: [id number] Date of Birth: Sep 24, 1960 Referring Provider (PT): Hulan Saas, DO    Encounter Date: 01/28/2018  PT End of Session - 01/28/18 0942    Visit Number  4    Date for PT Re-Evaluation  02/14/18    Authorization Time Period  01/27/18 to 02/14/18    Authorization - Visit Number  4    Authorization - Number of Visits  10    PT Start Time  1324    PT Stop Time  1013    PT Time Calculation (min)  42 min    Activity Tolerance  Patient tolerated treatment well;No increased pain    Behavior During Therapy  WFL for tasks assessed/performed       Past Medical History:  Diagnosis Date  . Allergy   . Anxiety   . Depression   . Gastroparesis   . Heart murmur   . Migraine headache   . Mitral valve prolapse     History reviewed. No pertinent surgical history.  There were no vitals filed for this visit.  Subjective Assessment - 01/28/18 0935    Subjective  Pt reports that he has been working on his exercises. He tripped and caught himself sideways and now his Lt mid back and Lt shoulder is sore.     Pertinent History  anxiety, depression, migraines    Limitations  Lifting    How long can you sit comfortably?  15 minutes     Patient Stated Goals  decrease pain     Currently in Pain?  Yes    Pain Score  5     Pain Location  Back    Pain Orientation  Left;Lateral    Pain Descriptors / Indicators  Aching    Pain Type  Chronic pain    Pain Radiating Towards  none     Pain Onset  More than a month ago    Pain Frequency  Constant    Aggravating Factors   bending over, alot of activity    Pain Relieving Factors  standing is preferred                       [facility] Adult PT Treatment/Exercise - 01/28/18 0001      Self-Care    Self-Care  Lifting    Lifting  lifiting small box from floor x10 reps, heavy cuing initially on LE position and maintaining back flat     Posture  lumbar roll at home; difference between desk chairs      Exercises   Exercises  Knee/Hip      Knee/Hip Exercises: Aerobic   Nustep  L1 x6 min UE/LE and PT present to discuss progress       Knee/Hip Exercises: Standing   Other Standing Knee Exercises  standing hip IR/ER with over pressure x10 reps each side       Knee/Hip Exercises: Seated   Other Seated Knee/Hip Exercises  seated trunk rotation stretch in chair 3x10 sec with breathing      Manual Therapy   Manual Therapy  Myofascial release    Soft tissue mobilization  STM Lt mid thoracic parapsinals    Myofascial Release  trigger point release Lt thoracic parspinals       Trigger Point Dry Needling -  01/28/18 0959    Consent Given?  Yes    Education Handout Provided  No    Muscles Treated Lower Body  --   Lt lumbar multifidi (+) twitch response          PT Education - 01/28/18 0942    Education Details  lifting mechanics; safe exercise for at the gym    Person(s) Educated  Patient    Methods  Explanation;Verbal cues    Comprehension  Returned demonstration;Verbalized understanding       PT Short Term Goals - 01/23/18 1025      PT SHORT TERM GOAL #1   Title  Pt will be independent with his initial HEP to improve ROM, strength and body mechanics.     Time  3    Period  Weeks    Status  On-going      PT SHORT TERM GOAL #2   Title  Pt will demo improved posture with sitting and consistent use of lumbar roll for additional back support throughout the day.     Time  3    Period  Weeks    Status  Partially Met      PT SHORT TERM GOAL #3   Title  Pt will report making proper sleeping adjustments to decrease strain on the Lt hip throughout the night.     Time  3    Period  Weeks    Status  Achieved      PT SHORT TERM GOAL #4   Title  Pt will demo good understanding  of proper posture and mechanics with lifting a small object (less than 5 lb) from the floor, 2/3 trials without PT cuing.     Time  3    Period  Weeks    Status  On-going        PT Long Term Goals - 12/31/17 1205      PT LONG TERM GOAL #1   Title  Pt will be independent with his advanced HEP to improve flexibility, strength and mechanics with daily activity after d/c from PT.     Time  6    Period  Weeks    Status  New    Target Date  02/14/18      PT LONG TERM GOAL #2   Title  Pt will report  atleast 75% improvement in his pain with sleeping through the night.     Time  6    Status  New      PT LONG TERM GOAL #3   Title  Pt will report being able to sit for atleast 30 minutes without increase in pain, which will allow him to drive around town without limitation.     Time  6    Period  Weeks    Status  New      PT LONG TERM GOAL #4   Title  Pt will be able to lift 10 lb box from the floor with proper mechanics and without increase in hip/back pain, 2/3 trials.     Time  6    Period  Weeks    Status  New            Plan - 01/28/18 1014    Clinical Impression Statement  Pt is doing well, reporting minimal Lt sided lumbar pain upon arrival. Therapist completed dry needling and soft tissue mobilization techniques to decrease muscle spasm and pain. Therapist also educated pt on proper lifting technique which pt  was able to demonstrate correctly. Therapist discussed desk posture and set up at home reinforce improvements in this session. Ended session with resolved pain.    Rehab Potential  Good    PT Frequency  2x / week    PT Duration  6 weeks    PT Treatment/Interventions  ADLs/Self Care Home Management;Cryotherapy;Electrical Stimulation;Moist Heat;Therapeutic exercise;Patient/family education;Manual techniques;Passive range of motion;Taping;Dry needling;Neuromuscular re-education;Functional mobility training;Therapeutic activities    PT Next Visit Plan  squatting exercise;  progress trunk strength and lumbar mobility    PT Home Exercise Plan  [email or web address]    Consulted and Agree with Plan of Care  Patient       Patient will benefit from skilled therapeutic intervention in order to improve the following deficits and impairments:  Decreased activity tolerance, Decreased strength, Impaired flexibility, Postural dysfunction, Pain, Improper body mechanics, Decreased scar mobility, Decreased range of motion, Hypomobility, Increased muscle spasms  Visit Diagnosis: Other muscle spasm  Pain in left hip  Abnormal posture  Muscle weakness (generalized)     Problem List Patient Active Problem List   Diagnosis Date Noted  . Left lumbar radiculopathy 10/10/2017  . Migraine without aura and without status migrainosus, not intractable 06/22/2017  . Vitamin D deficiency 05/28/2017  . Scrotal pain 05/28/2017  . Paroxysmal SVT (supraventricular tachycardia) ([place]) 05/28/2017  . Mild left inguinal hernia 11/23/2016  . Chronic pelvic pain in male 09/28/2016  . Abdominal pain, left lower quadrant 09/28/2016  . Isolated proteinuria without specific morphologic lesion 06/27/2016  . Left testicular pain 06/27/2016  . Acute left flank pain 06/27/2016  . Epididymitis, left 06/05/2016  . Pelvic floor dysfunction 04/01/2016  . GERD (gastroesophageal reflux disease) 10/13/2015  . Gastroparesis 10/13/2015  . Intractable chronic migraine without aura and without status migrainosus 12/15/2013  . Hyperlipidemia 04/04/2012  . Mitral valve disease 07/15/2009  . IRRITABLE BOWEL SYNDROME 07/15/2009  . Palpitations 06/28/2009   10:58 AM,01/28/18 Sherol Dade PT, DPT [place] [facility] [address], [place], [name], [address] Phone: [phone]   Fax:  [phone]  Name: William [name] MRN: [id number] Date of Birth: [birth year]

## 2018-01-30 ENCOUNTER — Ambulatory Visit: Payer: PPO | Admitting: Physical Therapy

## 2018-02-04 ENCOUNTER — Ambulatory Visit: Payer: PPO | Admitting: Physical Therapy

## 2018-02-04 DIAGNOSIS — M25552 Pain in left hip: Secondary | ICD-10-CM

## 2018-02-04 DIAGNOSIS — M6281 Muscle weakness (generalized): Secondary | ICD-10-CM

## 2018-02-04 DIAGNOSIS — M62838 Other muscle spasm: Secondary | ICD-10-CM | POA: Diagnosis not present

## 2018-02-04 DIAGNOSIS — G43109 Migraine with aura, not intractable, without status migrainosus: Secondary | ICD-10-CM | POA: Diagnosis not present

## 2018-02-04 DIAGNOSIS — R293 Abnormal posture: Secondary | ICD-10-CM

## 2018-02-04 NOTE — Therapy (Signed)
[facility] [address], [place], [name], [address] Phone: [phone]   Fax:  [phone]  Physical Therapy Treatment  Patient Details  Name: William [name] MRN: [id number] Date of Birth: [birth year] Referring Provider (PT): Hulan Saas, DO    Encounter Date: 02/04/2018  PT End of Session - 02/04/18 1021    Visit Number  5    Date for PT Re-Evaluation  02/14/18    Authorization Time Period  01/27/18 to 02/14/18    Authorization - Visit Number  5    Authorization - Number of Visits  10    PT Start Time  0933    PT Stop Time  1018    PT Time Calculation (min)  45 min    Activity Tolerance  Patient tolerated treatment well;No increased pain    Behavior During Therapy  WFL for tasks assessed/performed       Past Medical History:  Diagnosis Date  . Allergy   . Anxiety   . Depression   . Gastroparesis   . Heart murmur   . Migraine headache   . Mitral valve prolapse     No past surgical history on file.  There were no vitals filed for this visit.  Subjective Assessment - 02/04/18 0936    Subjective  Pt reports that things are going well. He is not as stressed with family at the moment, and has been trying to complete his HEP. He has an issue with breathing properly.     Pertinent History  anxiety, depression, migraines    Limitations  Lifting    How long can you sit comfortably?  15 minutes     Patient Stated Goals  decrease pain     Currently in Pain?  No/denies    Pain Onset  More than a month ago                       [facility] Adult PT Treatment/Exercise - 02/04/18 0001      Self-Care   Lifting  lifting #5 box from floor x5 reps; lifting #10 box from floor and turning to place on shelf, verbal cues needed throughout      Exercises   Exercises  Knee/Hip      Knee/Hip Exercises: Aerobic   Elliptical  ramp 5, L1 x4 min PT present to discuss progress       Knee/Hip Exercises:  Machines for Strengthening   Total Gym Leg Press  Seat 8 #110 BLE 2x15 reps PT cues for foot/knee placement       Knee/Hip Exercises: Standing   Wall Squat  1 set;10 reps    Wall Squat Limitations  verbal cues     Other Standing Knee Exercises  BLE squat with mirror feedback x10 reps, x10 reps with blue TB resistance at hips and red TB around knees     Other Standing Knee Exercises  squat hold with 5# dumbbell rows 2x8 reps              PT Education - 02/04/18 1020    Education Details  lifting mechanics; technique with squats/machines for LE strengthening     Person(s) Educated  Patient    Methods  Explanation;Tactile cues;Handout;Demonstration    Comprehension  Verbalized understanding;Returned demonstration       PT Short Term Goals - 02/04/18 1024      PT SHORT TERM GOAL #1   Title  Pt will be independent with  his initial HEP to improve ROM, strength and body mechanics.     Time  3    Period  Weeks    Status  Achieved      PT SHORT TERM GOAL #2   Title  Pt will demo improved posture with sitting and consistent use of lumbar roll for additional back support throughout the day.     Time  3    Period  Weeks    Status  Achieved      PT SHORT TERM GOAL #3   Title  Pt will report making proper sleeping adjustments to decrease strain on the Lt hip throughout the night.     Time  3    Period  Weeks    Status  Achieved      PT SHORT TERM GOAL #4   Title  Pt will demo good understanding of proper posture and mechanics with lifting a small object (less than 5 lb) from the floor, 2/3 trials without PT cuing.     Baseline  improved but required instruction this session    Time  3    Period  Weeks    Status  Partially Met        PT Long Term Goals - 12/31/17 1205      PT LONG TERM GOAL #1   Title  Pt will be independent with his advanced HEP to improve flexibility, strength and mechanics with daily activity after d/c from PT.     Time  6    Period  Weeks    Status   New    Target Date  02/14/18      PT LONG TERM GOAL #2   Title  Pt will report  atleast 75% improvement in his pain with sleeping through the night.     Time  6    Status  New      PT LONG TERM GOAL #3   Title  Pt will report being able to sit for atleast 30 minutes without increase in pain, which will allow him to drive around town without limitation.     Time  6    Period  Weeks    Status  New      PT LONG TERM GOAL #4   Title  Pt will be able to lift 10 lb box from the floor with proper mechanics and without increase in hip/back pain, 2/3 trials.     Time  6    Period  Weeks    Status  New            Plan - 02/04/18 1021    Clinical Impression Statement  Pt arrived with improvements in lifting mechanics, although he had difficulty in the store when placing his object in the buggy or rotating. Therapist reviewed these scenarios with the pt and he reported pain free lifting during this. Therapist focused the remainder of the session on squat activity and variations with attention to proper technique. Pt does tend to shift his weight forward which places increased strain on the low back. Ended with LE fatigue and reported understanding of all exercises reviewed. Several squat examples were provided for his home reference. Will continue with current POC.     Rehab Potential  Good    PT Frequency  2x / week    PT Duration  6 weeks    PT Treatment/Interventions  ADLs/Self Care Home Management;Cryotherapy;Electrical Stimulation;Moist Heat;Therapeutic exercise;Patient/family education;Manual techniques;Passive range of motion;Taping;Dry needling;Neuromuscular re-education;Functional mobility  training;Therapeutic activities    PT Next Visit Plan  f/u on squats; hip flexiblity/lumbar flexibility over foam roll; pull downs/rows    PT Home Exercise Plan  [email or web address]    Consulted and Agree with Plan of Care  Patient       Patient will benefit from skilled therapeutic intervention in order  to improve the following deficits and impairments:  Decreased activity tolerance, Decreased strength, Impaired flexibility, Postural dysfunction, Pain, Improper body mechanics, Decreased scar mobility, Decreased range of motion, Hypomobility, Increased muscle spasms  Visit Diagnosis: Other muscle spasm  Pain in left hip  Abnormal posture  Muscle weakness (generalized)     Problem List Patient Active Problem List   Diagnosis Date Noted  . Left lumbar radiculopathy 10/10/2017  . Migraine without aura and without status migrainosus, not intractable 06/22/2017  . Vitamin D deficiency 05/28/2017  . Scrotal pain 05/28/2017  . Paroxysmal SVT (supraventricular tachycardia) ([place]) 05/28/2017  . Mild left inguinal hernia 11/23/2016  . Chronic pelvic pain in male 09/28/2016  . Abdominal pain, left lower quadrant 09/28/2016  . Isolated proteinuria without specific morphologic lesion 06/27/2016  . Left testicular pain 06/27/2016  . Acute left flank pain 06/27/2016  . Epididymitis, left 06/05/2016  . Pelvic floor dysfunction 04/01/2016  . GERD (gastroesophageal reflux disease) 10/13/2015  . Gastroparesis 10/13/2015  . Intractable chronic migraine without aura and without status migrainosus 12/15/2013  . Hyperlipidemia 04/04/2012  . Mitral valve disease 07/15/2009  . IRRITABLE BOWEL SYNDROME 07/15/2009  . Palpitations 06/28/2009    10:26 AM,02/04/18 Sherol Dade PT, DPT [place] [facility] [address] [place], [name], [address] Phone: [phone]   Fax:  [phone]  Name: William [name] MRN: [id number] Date of Birth: [birth year]

## 2018-02-06 ENCOUNTER — Encounter: Payer: Self-pay | Admitting: Physical Therapy

## 2018-02-06 ENCOUNTER — Ambulatory Visit: Payer: PPO | Admitting: Physical Therapy

## 2018-02-06 DIAGNOSIS — M62838 Other muscle spasm: Secondary | ICD-10-CM | POA: Diagnosis not present

## 2018-02-06 DIAGNOSIS — M6281 Muscle weakness (generalized): Secondary | ICD-10-CM

## 2018-02-06 DIAGNOSIS — R293 Abnormal posture: Secondary | ICD-10-CM

## 2018-02-06 DIAGNOSIS — M25552 Pain in left hip: Secondary | ICD-10-CM

## 2018-02-06 NOTE — Therapy (Signed)
[facility] [address] [place], [name], [address] Phone: [phone]   Fax:  [phone]  Physical Therapy Treatment  Patient Details  Name: William [name] MRN: [id number] Date of Birth: May 12, 1960 Referring Provider (PT): Hulan Saas, DO    Encounter Date: 02/06/2018  PT End of Session - 02/06/18 1112    Visit Number  6    Date for PT Re-Evaluation  02/14/18    Authorization Time Period  01/27/18 to 02/14/18    Authorization - Visit Number  6    Authorization - Number of Visits  10    PT Start Time  1856    PT Stop Time  3149    PT Time Calculation (min)  41 min    Activity Tolerance  Patient tolerated treatment well;No increased pain    Behavior During Therapy  WFL for tasks assessed/performed       Past Medical History:  Diagnosis Date  . Allergy   . Anxiety   . Depression   . Gastroparesis   . Heart murmur   . Migraine headache   . Mitral valve prolapse     History reviewed. No pertinent surgical history.  There were no vitals filed for this visit.  Subjective Assessment - 02/06/18 0939    Subjective  Pt reports that he is doing well. He tried practicing his lifting but still has issues with using proper technique throughout the day.     Pertinent History  anxiety, depression, migraines    Limitations  Lifting    How long can you sit comfortably?  15 minutes     Patient Stated Goals  decrease pain     Currently in Pain?  No/denies    Pain Onset  More than a month ago                       [facility] Adult PT Treatment/Exercise - 02/06/18 0001      Exercises   Exercises  Other Exercises;Lumbar    Other Exercises   Power tower rows x5 reps #35 and x15 reps with #45; power tower BUE pressdown #35 2x10 reps       Lumbar Exercises: Prone   Other Prone Lumbar Exercises  incline plank/elbows extended x15 sec, incline plank elbows bent       Lumbar Exercises: Quadruped   Madcat/Old Horse  15 reps    Madcat/Old Horse Limitations  decreased flexion/extension noted      Knee/Hip Exercises: Stretches   Other Knee/Hip Stretches  Lt and Rt glute stretch in supine 3x20 sec hold       Knee/Hip Exercises: Machines for Strengthening   Total Gym Leg Press  Seat 7 #110 BLE x15 reps, increased to #135 x15 reps              PT Education - 02/06/18 1111    Education Details  technique with therex; transition to home program/personal trainer after PT; HEP updated    Person(s) Educated  Patient    Methods  Explanation;Handout;Tactile cues;Demonstration    Comprehension  Verbalized understanding;Returned demonstration       PT Short Term Goals - 02/04/18 1024      PT SHORT TERM GOAL #1   Title  Pt will be independent with his initial HEP to improve ROM, strength and body mechanics.     Time  3    Period  Weeks    Status  Achieved  PT SHORT TERM GOAL #2   Title  Pt will demo improved posture with sitting and consistent use of lumbar roll for additional back support throughout the day.     Time  3    Period  Weeks    Status  Achieved      PT SHORT TERM GOAL #3   Title  Pt will report making proper sleeping adjustments to decrease strain on the Lt hip throughout the night.     Time  3    Period  Weeks    Status  Achieved      PT SHORT TERM GOAL #4   Title  Pt will demo good understanding of proper posture and mechanics with lifting a small object (less than 5 lb) from the floor, 2/3 trials without PT cuing.     Baseline  improved but required instruction this session    Time  3    Period  Weeks    Status  Partially Met        PT Long Term Goals - 12/31/17 1205      PT LONG TERM GOAL #1   Title  Pt will be independent with his advanced HEP to improve flexibility, strength and mechanics with daily activity after d/c from PT.     Time  6    Period  Weeks    Status  New    Target Date  02/14/18      PT LONG TERM GOAL #2   Title  Pt will  report  atleast 75% improvement in his pain with sleeping through the night.     Time  6    Status  New      PT LONG TERM GOAL #3   Title  Pt will report being able to sit for atleast 30 minutes without increase in pain, which will allow him to drive around town without limitation.     Time  6    Period  Weeks    Status  New      PT LONG TERM GOAL #4   Title  Pt will be able to lift 10 lb box from the floor with proper mechanics and without increase in hip/back pain, 2/3 trials.     Time  6    Period  Weeks    Status  New            Plan - 02/06/18 1000    Clinical Impression Statement  Pt continues to report minimal back pain throughout the day as well as increased awareness of his posture with lifting. He was able to progress resistance with leg press and completed several other exercises to increase back strength without any difficulty or increase in pain. Pt had several questions regarding safe set up of various exercises and therapist was able to educate and provide proper adjustments. Ended session with HEP updates to reflect today's exercises and pt verbalized good understanding.     Rehab Potential  Good    PT Frequency  2x / week    PT Duration  6 weeks    PT Treatment/Interventions  ADLs/Self Care Home Management;Cryotherapy;Electrical Stimulation;Moist Heat;Therapeutic exercise;Patient/family education;Manual techniques;Passive range of motion;Taping;Dry needling;Neuromuscular re-education;Functional mobility training;Therapeutic activities    PT Next Visit Plan  f/u on squats; hip flexiblity/lumbar flexibility over foam roll; pull downs/rows, LE strength progressions    PT Home Exercise Plan  [email or web address]    Consulted and Agree with Plan of Care  Patient  Patient will benefit from skilled therapeutic intervention in order to improve the following deficits and impairments:  Decreased activity tolerance, Decreased strength, Impaired flexibility, Postural dysfunction,  Pain, Improper body mechanics, Decreased scar mobility, Decreased range of motion, Hypomobility, Increased muscle spasms  Visit Diagnosis: Other muscle spasm  Pain in left hip  Abnormal posture  Muscle weakness (generalized)     Problem List Patient Active Problem List   Diagnosis Date Noted  . Left lumbar radiculopathy 10/10/2017  . Migraine without aura and without status migrainosus, not intractable 06/22/2017  . Vitamin D deficiency 05/28/2017  . Scrotal pain 05/28/2017  . Paroxysmal SVT (supraventricular tachycardia) ([place]) 05/28/2017  . Mild left inguinal hernia 11/23/2016  . Chronic pelvic pain in male 09/28/2016  . Abdominal pain, left lower quadrant 09/28/2016  . Isolated proteinuria without specific morphologic lesion 06/27/2016  . Left testicular pain 06/27/2016  . Acute left flank pain 06/27/2016  . Epididymitis, left 06/05/2016  . Pelvic floor dysfunction 04/01/2016  . GERD (gastroesophageal reflux disease) 10/13/2015  . Gastroparesis 10/13/2015  . Intractable chronic migraine without aura and without status migrainosus 12/15/2013  . Hyperlipidemia 04/04/2012  . Mitral valve disease 07/15/2009  . IRRITABLE BOWEL SYNDROME 07/15/2009  . Palpitations 06/28/2009   11:23 AM,02/06/18 Sherol Dade PT, DPT [facility] [facility] [address] [place], [name], [address] Phone: [phone]   Fax:  [phone]  Name: William [name] MRN: [id number] Date of Birth: June 22, 1960

## 2018-02-06 NOTE — Patient Instructions (Signed)
Access Code: [email or web address]  URL: [email or web address]  Date: 02/06/2018  Prepared by: Sherol Dade   Exercises  Sidelying Thoracic Rotation - 10 reps - 2-3x daily - 7x weekly  Supine Gluteus Stretch - 3 sets - 30 hold - 1x daily - 7x weekly  Supine Double Knee to Chest - 3 sets - 30 hold - 1x daily - 7x weekly  Seated Gluteal Stretch - 10 reps - 3 sets - 1x daily - 7x weekly  Seated Piriformis Stretch - 3 sets - 30 hold - 1x daily - 7x weekly  Seated Hamstring Stretch - 3 sets - 30 hold - 1x daily - 7x weekly  Plank with Elbows on Table - 15 reps - 1 sets - 1x daily - 7x weekly  Cat Cow - 15 reps - 1 sets - 1x daily - 7x weekly    [facility] [address] [place], [place] [address] Phone # [phone] Fax [phone]

## 2018-02-07 ENCOUNTER — Ambulatory Visit (INDEPENDENT_AMBULATORY_CARE_PROVIDER_SITE_OTHER): Payer: PPO | Admitting: Family Medicine

## 2018-02-07 ENCOUNTER — Encounter: Payer: Self-pay | Admitting: Family Medicine

## 2018-02-07 VITALS — BP 124/75 | HR 83 | Temp 97.8°F | Resp 16 | Ht 71.0 in | Wt 145.8 lb

## 2018-02-07 DIAGNOSIS — G43009 Migraine without aura, not intractable, without status migrainosus: Secondary | ICD-10-CM | POA: Diagnosis not present

## 2018-02-07 DIAGNOSIS — K580 Irritable bowel syndrome with diarrhea: Secondary | ICD-10-CM

## 2018-02-07 NOTE — Progress Notes (Signed)
10/11/201910:53 AM  William [name] 1961-01-28, 57 y.o. male [id number]  Chief Complaint  Patient presents with  . Migraine    chronic headache; started back x1 month ago  . Diarrhea    related to migraine  . Abdominal Pain    related to migraine    HPI:   Patient is a 57 y.o. male with past medical history significant for MVP, SVT, chronic migraines, IBS, lumbar radiculopathy, HLP, gastroparesis who presents today for migraines  Saw by [facility] 3 days ago, was given ex for relpax 40mg and referred to neurology headache clinic, Dr Joretta Bachelor Out of network  Long standing h/o migraines Was given bulbital/APAP/caffeine helps with migraines Has noticed increased diarrhea and cramping Takes about once or twice a week imitrex caused flushing sensation, did not try relpax Very stressed recently, caring for an elderly parent   Fall Risk  12/05/2017 11/20/2017 11/01/2017 10/16/2017 09/19/2017  Falls in the past year? No No No No No  Number falls in past yr: - - - - -  Injury with Fall? - - - - -  Comment - - - - -     Depression screen [facility] 2/9 12/05/2017 11/20/2017 11/01/2017  Decreased Interest 0 0 0  Down, Depressed, Hopeless 0 0 0  PHQ - 2 Score 0 0 0  Altered sleeping - - -  Tired, decreased energy - - -  Change in appetite - - -  Feeling bad or failure about yourself  - - -  Trouble concentrating - - -  Moving slowly or fidgety/restless - - -  Suicidal thoughts - - -  PHQ-9 Score - - -  Some recent data might be hidden    Allergies  Allergen Reactions  . Erythromycin Anaphylaxis and Nausea And Vomiting  . Nalbuphine Other (See Comments)    States loss of consciousness   . Cefdinir Diarrhea, Nausea And Vomiting and Rash  . Cephalexin Diarrhea and Nausea And Vomiting  . Cephalosporins Diarrhea and Nausea And Vomiting  . Hydrocodone-Acetaminophen Nausea And Vomiting  . Ketorolac Nausea And Vomiting  . Prochlorperazine Edisylate Other (See Comments)    uncontrollable  shake - tremor   . Restasis [Cyclosporine] Other (See Comments)    migraines  . Tyloxapol Swelling  . Propranolol Diarrhea  . Topamax [Topiramate] Diarrhea and Nausea And Vomiting  . Zetia [Ezetimibe] Diarrhea  . Doxycycline Hyclate Nausea Only and Other (See Comments)    abd pain  . Iodinated Diagnostic Agents Other (See Comments)    Lightheaded, flushed feeling (explained to patient these are normal side effects of IV iodinated contrast, not allergies, but he wanted this noted in the epic; he tolerated epidural steroid injections w/ contrast w/o difficulty)  . Ketoconazole Itching  . Latex Itching  . Neosporin [Neomycin-Bacitracin Zn-Polymyx] Rash  . Oxycodone-Acetaminophen Other (See Comments)    Tylox caused constipation  . Prednisone Nausea And Vomiting    Oral route, only    Prior to Admission medications   Medication Sig Start Date End Date Taking? Authorizing Provider  butalbital-acetaminophen-caffeine (FIORICET, ESGIC) [phone] MG tablet Take 1 tablet by mouth every 4 (four) hours as needed for headache. 09/18/17  Yes Shawnee Knapp, MD  cyclobenzaprine (FLEXERIL) 10 MG tablet Take 1 tablet (10 mg total) by mouth 3 (three) times daily as needed for muscle spasms. 09/05/17  Yes Shawnee Knapp, MD  diltiazem (CARDIZEM CD) 120 MG 24 hr capsule Take 1 capsule (120 mg total) by mouth daily. 09/06/17  Yes McAlhany,  Annita Brod, MD  diltiazem (CARDIZEM) 30 MG tablet Take 1 tablet (30 mg total) by mouth as needed. TAKE FOR PALPITATIONS 03/26/17  Yes Imogene Burn, PA-C  eletriptan (RELPAX) 40 MG tablet Take by mouth. 02/04/18  Yes [provider]  fluticasone (FLONASE) 50 MCG/ACT nasal spray Place 2 sprays daily into both nostrils. 03/18/17  Yes Delia Chimes A, MD  gabapentin (NEURONTIN) 300 MG capsule nightly 12/09/17  Yes Hulan Saas M, DO  methocarbamol (ROBAXIN) 500 MG tablet Take 1 tablet (500 mg total) by mouth 4 (four) times daily. For muscle spasm 09/19/17  Yes Shawnee Knapp, MD  ondansetron [facility]) 4 MG tablet Take by mouth.   Yes [provider]  promethazine (PHENERGAN) 12.5 MG tablet Take 1 tablet (12.5 mg total) by mouth every 8 (eight) hours as needed (migraine headache). 05/28/17  Yes Shawnee Knapp, MD  Vitamin D, Ergocalciferol, (DRISDOL) 50000 units CAPS capsule Take 1 capsule (50,000 Units total) by mouth every 7 (seven) days. 11/20/17  Yes Shawnee Knapp, MD  gabapentin (NEURONTIN) 300 MG capsule Take by mouth.    [provider]    Past Medical History:  Diagnosis Date  . Allergy   . Anxiety   . Depression   . Gastroparesis   . Heart murmur   . Migraine headache   . Mitral valve prolapse     History reviewed. No pertinent surgical history.  Social History   Tobacco Use  . Smoking status: Never Smoker  . Smokeless tobacco: Never Used  Substance Use Topics  . Alcohol use: No    Alcohol/week: 0.0 standard drinks    Family History  Problem Relation Age of Onset  . Hypertension Mother   . Hypertension Brother     ROS Per hpi  OBJECTIVE:  Blood pressure 124/75, pulse 83, temperature 97.8 F (36.6 C), temperature source Oral, resp. rate 16, height 5\' 11"  (1.803 m), weight 145 lb 12.8 oz (66.1 kg), SpO2 97 %. Body mass index is 20.33 kg/m.   Physical Exam  Constitutional: He is oriented to person, place, and time. He appears well-developed and well-nourished.  HENT:  Head: Normocephalic and atraumatic.  Mouth/Throat: Oropharynx is clear and moist.  Eyes: Pupils are equal, round, and reactive to light. Conjunctivae and EOM are normal.  Neck: Neck supple.  Pulmonary/Chest: Effort normal.  Neurological: He is alert and oriented to person, place, and time.  Skin: Skin is warm and dry.  Psychiatric: He has a normal mood and affect.  Nursing note and vitals reviewed.    ASSESSMENT and PLAN  1. Migraine without aura and without status migrainosus, not intractable - AMB referral to headache clinic  2. Irritable  bowel syndrome with diarrhea Discussed low fodmap diet. Patient educational handouts given.   Discussed counseling. Provided contact info  Other orders   Return for already scheduled with Dr Brigitte Pulse.    Rutherford Guys, MD Primary Care at [place], [place] [address] Ph.  [phone] Fax [phone]

## 2018-02-07 NOTE — Patient Instructions (Addendum)
  For therapy -- [facility] ([address] Laqueta Due [place]) [place] [facility] [address] [place]) - [facility] - [phone] [facility] [place] - [phone] [place], [phone] [facility]  - [phone] (do not file insurance) Florentina Jenny [phone] [facility] - [place], [phone] [facility] - [phone]  Nutritionist [facility]   If you have lab work done today you will be contacted with your lab results within the next 2 weeks.  If you have not heard from [place] then please contact us. The fastest way to get your results is to register for [name].   IF you received an x-ray today, you will receive an invoice from [facility]. Please contact [facility] at [phone] with questions or concerns regarding your invoice.   IF you received labwork today, you will receive an invoice from [facility]. Please contact [name] at [phone] with questions or concerns regarding your invoice.   Our billing staff will not be able to assist you with questions regarding bills from these companies.  You will be contacted with the lab results as soon as they are available. The fastest way to get your results is to activate your [name] account. Instructions are located on the last page of this paperwork. If you have not heard from [place] regarding the results in 2 weeks, please contact this office.

## 2018-02-11 ENCOUNTER — Ambulatory Visit: Payer: PPO | Admitting: Physical Therapy

## 2018-02-11 ENCOUNTER — Encounter: Payer: Self-pay | Admitting: Physical Therapy

## 2018-02-11 DIAGNOSIS — M25552 Pain in left hip: Secondary | ICD-10-CM

## 2018-02-11 DIAGNOSIS — M62838 Other muscle spasm: Secondary | ICD-10-CM | POA: Diagnosis not present

## 2018-02-11 DIAGNOSIS — M6281 Muscle weakness (generalized): Secondary | ICD-10-CM

## 2018-02-11 DIAGNOSIS — R293 Abnormal posture: Secondary | ICD-10-CM

## 2018-02-11 NOTE — Therapy (Signed)
[facility] [address] [place], [place], [address] Phone: [phone]   Fax:  [phone]  Physical Therapy Treatment  Patient Details  Name: William [name] MRN: [id number] Date of Birth: 06/09/1960 Referring Provider (PT): Zachary Smith, DO    Encounter Date: 02/11/2018  PT End of Session - 02/11/18 1012    Visit Number  7    Date for PT Re-Evaluation  02/14/18    Authorization Time Period  01/27/18 to 02/14/18    Authorization - Visit Number  7    Authorization - Number of Visits  10    PT Start Time  0932    PT Stop Time  1015    PT Time Calculation (min)  43 min    Activity Tolerance  Patient tolerated treatment well;No increased pain    Behavior During Therapy  WFL for tasks assessed/performed       Past Medical History:  Diagnosis Date  . Allergy   . Anxiety   . Depression   . Gastroparesis   . Heart murmur   . Migraine headache   . Mitral valve prolapse     History reviewed. No pertinent surgical history.  There were no vitals filed for this visit.  Subjective Assessment - 02/11/18 0939    Subjective  Pt reports that he is about 80% improved since beginning PT. He will have some flare ups of his pain when lifting heavy objects.    Pertinent History  anxiety, depression, migraines    Limitations  Lifting    How long can you sit comfortably?  15 minutes     Patient Stated Goals  decrease pain     Currently in Pain?  No/denies    Pain Onset  More than a month ago                       [facility] Adult PT Treatment/Exercise - 02/11/18 0001      Exercises   Other Exercises   B lat pull down #45 x10 reps; tricep dips x7 reps (exercise technique)      Knee/Hip Exercises: Stretches   Piriformis Stretch  Right;Left;1 rep;30 seconds    Piriformis Stretch Limitations  seated     Other Knee/Hip Stretches  dyamic stretch: knee hug walk x5 reps each, standing hip circle x5 reps  each       Knee/Hip Exercises: Standing   Other Standing Knee Exercises  lunge x8 reps each LE     Other Standing Knee Exercises  front squat x5 reps, cues for technique              PT Education - 02/11/18 0950    Education Details  safe stretches;machines for home/gym; safe exercise progressions     Person(s) Educated  Patient    Methods  Explanation;Verbal cues;Demonstration    Comprehension  Returned demonstration;Verbalized understanding       PT Short Term Goals - 02/04/18 1024      PT SHORT TERM GOAL #1   Title  Pt will be independent with his initial HEP to improve ROM, strength and body mechanics.     Time  3    Period  Weeks    Status  Achieved      PT SHORT TERM GOAL #2   Title  Pt will demo improved posture with sitting and consistent use of lumbar roll for additional back support throughout the day.     Time    3    Period  Weeks    Status  Achieved      PT SHORT TERM GOAL #3   Title  Pt will report making proper sleeping adjustments to decrease strain on the Lt hip throughout the night.     Time  3    Period  Weeks    Status  Achieved      PT SHORT TERM GOAL #4   Title  Pt will demo good understanding of proper posture and mechanics with lifting a small object (less than 5 lb) from the floor, 2/3 trials without PT cuing.     Baseline  improved but required instruction this session    Time  3    Period  Weeks    Status  Partially Met        PT Long Term Goals - 02/11/18 1031      PT LONG TERM GOAL #1   Title  Pt will be independent with his advanced HEP to improve flexibility, strength and mechanics with daily activity after d/c from PT.     Time  6    Period  Weeks    Status  New      PT LONG TERM GOAL #2   Title  Pt will report  atleast 75% improvement in his pain with sleeping through the night.     Baseline  80%    Time  6    Status  Achieved      PT LONG TERM GOAL #3   Title  Pt will report being able to sit for atleast 30 minutes  without increase in pain, which will allow him to drive around town without limitation.     Time  6    Period  Weeks    Status  New      PT LONG TERM GOAL #4   Title  Pt will be able to lift 10 lb box from the floor with proper mechanics and without increase in hip/back pain, 2/3 trials.     Time  6    Period  Weeks    Status  On-going            Plan - 02/11/18 1021    Clinical Impression Statement  Pt is making good progress, with 80% improvement in his pain overall since beginning PT. He arrived with several questions and exercise examples that he needed to review for proper understanding. Therapist was able to make adjustments to multiple flexibility and strength exercises and pt had good understanding of this. He does have remaining limitations in LE flexibility and exercise understanding and would continue to benefit from skilled PT to allow for safe transition to his home program.    Rehab Potential  Good    PT Frequency  2x / week    PT Duration  6 weeks    PT Treatment/Interventions  ADLs/Self Care Home Management;Cryotherapy;Electrical Stimulation;Moist Heat;Therapeutic exercise;Patient/family education;Manual techniques;Passive range of motion;Taping;Dry needling;Neuromuscular re-education;Functional mobility training;Therapeutic activities    PT Next Visit Plan  re-evaluation; deadlift technique; leg extension/curl machine    PT Home Exercise Plan  [email or web address]    Consulted and Agree with Plan of Care  Patient       Patient will benefit from skilled therapeutic intervention in order to improve the following deficits and impairments:  Decreased activity tolerance, Decreased strength, Impaired flexibility, Postural dysfunction, Pain, Improper body mechanics, Decreased scar mobility, Decreased range of motion, Hypomobility, Increased muscle spasms  Visit Diagnosis: Other muscle   spasm  Pain in left hip  Abnormal posture  Muscle weakness (generalized)     Problem  List Patient Active Problem List   Diagnosis Date Noted  . Left lumbar radiculopathy 10/10/2017  . Migraine without aura and without status migrainosus, not intractable 06/22/2017  . Vitamin D deficiency 05/28/2017  . Scrotal pain 05/28/2017  . Paroxysmal SVT (supraventricular tachycardia) ([place]) 05/28/2017  . Mild left inguinal hernia 11/23/2016  . Chronic pelvic pain in male 09/28/2016  . Abdominal pain, left lower quadrant 09/28/2016  . Isolated proteinuria without specific morphologic lesion 06/27/2016  . Left testicular pain 06/27/2016  . Acute left flank pain 06/27/2016  . Epididymitis, left 06/05/2016  . Pelvic floor dysfunction 04/01/2016  . GERD (gastroesophageal reflux disease) 10/13/2015  . Gastroparesis 10/13/2015  . Intractable chronic migraine without aura and without status migrainosus 12/15/2013  . Hyperlipidemia 04/04/2012  . Mitral valve disease 07/15/2009  . IRRITABLE BOWEL SYNDROME 07/15/2009  . Palpitations 06/28/2009     10:34 AM,02/11/18 Sherol Dade PT, DPT [place] [facility] [address] [place], [name], [address] Phone: [phone]   Fax:  [phone]  Name: William [name] MRN: [id number] Date of Birth: Jun 20, 1960

## 2018-02-13 ENCOUNTER — Ambulatory Visit: Payer: PPO | Admitting: Physical Therapy

## 2018-02-13 ENCOUNTER — Encounter: Payer: Self-pay | Admitting: Physical Therapy

## 2018-02-13 DIAGNOSIS — R293 Abnormal posture: Secondary | ICD-10-CM

## 2018-02-13 DIAGNOSIS — M62838 Other muscle spasm: Secondary | ICD-10-CM

## 2018-02-13 DIAGNOSIS — M25552 Pain in left hip: Secondary | ICD-10-CM

## 2018-02-13 NOTE — Patient Instructions (Signed)
Access Code: [email or web address]  URL: [email or web address]  Date: 02/13/2018  Prepared by: Sherol Dade   Exercises  Sidelying Thoracic Rotation - 10 reps - 2-3x daily - 7x weekly  Supine Gluteus Stretch - 3 sets - 30 hold - 1x daily - 7x weekly  Supine Double Knee to Chest - 3 sets - 30 hold - 1x daily - 7x weekly  Seated Gluteal Stretch - 10 reps - 3 sets - 1x daily - 7x weekly  Seated Piriformis Stretch - 3 sets - 30 hold - 1x daily - 7x weekly  Seated Hamstring Stretch - 3 sets - 30 hold - 1x daily - 7x weekly  Plank with Elbows on Table - 15 reps - 1 sets - 1x daily - 7x weekly  Cat Cow - 15 reps - 1 sets - 1x daily - 7x weekly  Seated High Lat Pull Down with Overhead Anchored Resistance - 10 reps - 3 sets - 1x daily - 7x weekly  Quadriceps Stretch with Chair - 2 reps - 20 hold - 1x daily - 7x weekly    [facility] [address], [place], [place] [address] Phone # [phone] Fax [phone]

## 2018-02-13 NOTE — Therapy (Signed)
[facility] [address], [place], [name], [address] Phone: [phone]   Fax:  [phone]  Physical Therapy Treatment/Re-evaluation  Patient Details  Name: William [name] MRN: [id number] Date of Birth: 1960-07-24 Referring Provider (PT): Hulan Saas, DO    Encounter Date: 02/13/2018  PT End of Session - 02/13/18 0958    Visit Number  8    Date for PT Re-Evaluation  02/14/18    Authorization Time Period  new: 02/15/18 to 04/17/18    Authorization - Visit Number  8    Authorization - Number of Visits  18    PT Start Time  6387    PT Stop Time  5643    PT Time Calculation (min)  41 min    Activity Tolerance  Patient tolerated treatment well;No increased pain    Behavior During Therapy  WFL for tasks assessed/performed       Past Medical History:  Diagnosis Date  . Allergy   . Anxiety   . Depression   . Gastroparesis   . Heart murmur   . Migraine headache   . Mitral valve prolapse     History reviewed. No pertinent surgical history.  There were no vitals filed for this visit.  Subjective Assessment - 02/13/18 0937    Subjective  Pt reports that he is doing good. His legs are a little sore, and his low back is a little tired following cleaning the tub. He is now aware of the proper lifting mechanics.     Pertinent History  anxiety, depression, migraines    Limitations  Lifting    How long can you sit comfortably?  15 minutes     Patient Stated Goals  decrease pain     Currently in Pain?  No/denies    Pain Onset  More than a month ago         [facility] PT Assessment - 02/13/18 0001      Assessment   Medical Diagnosis  Lumbar radiculopathy    Referring Provider (PT)  Hulan Saas, DO     Next MD Visit  might be going back soon     Prior Therapy  Pelvic floor PT      Precautions   Precautions  None      Restrictions   Weight Bearing Restrictions  No      Balance Screen   Has the  patient fallen in the past 6 months  No    Has the patient had a decrease in activity level because of a fear of falling?   No    Is the patient reluctant to leave their home because of a fear of falling?   No      Prior Function   Leisure  currently his mother lives with him       Cognition   Overall Cognitive Status  Within Functional Limits for tasks assessed      Observation/Other Assessments   Observations  sitting slouched in chair       Posture/Postural Control   Posture Comments  forward head, rounded shoulders       AROM   Lumbar Flexion  pain free    Lumbar Extension  pain free    Lumbar - Right Rotation  limited, pain free    Lumbar - Left Rotation  limited, pain free      Strength   Right Hip Flexion  5/5    Right Hip Extension  5/5  Right Hip ABduction  5/5    Left Hip Flexion  5/5    Left Hip Extension  5/5    Left Hip ABduction  5/5    Right Knee Flexion  5/5    Right Knee Extension  5/5    Left Knee Flexion  5/5    Left Knee Extension  5/5    Right Ankle Dorsiflexion  5/5    Left Ankle Dorsiflexion  5/5      Flexibility   Soft Tissue Assessment /Muscle Length  yes    Quadriceps  (+) thomas test for hip quadriceps Lt and Rt       Palpation   Spinal mobility  --    Palpation comment  --      Special Tests   Other special tests  --                   [facility] Adult PT Treatment/Exercise - 02/13/18 0001      Self-Care   Lifting  lifting and turning demonstration with heavy objects; cleaning down on one knee compared to bent forward    Posture  use of lumbar roll while sitting      Knee/Hip Exercises: Stretches   Other Knee/Hip Stretches  Lt and Rt quad stretch in standaing x30 sec each              PT Education - 02/13/18 1008    Education Details  POC updates and HEP updates; improving independence with HEP; lifting mechanics     Person(s) Educated  Patient    Methods  Explanation;Verbal cues;Handout    Comprehension   Verbalized understanding;Returned demonstration       PT Short Term Goals - 02/13/18 1106      PT SHORT TERM GOAL #1   Title  Pt will be independent with his initial HEP to improve ROM, strength and body mechanics.     Time  3    Period  Weeks    Status  Achieved      PT SHORT TERM GOAL #2   Title  Pt will demo improved posture with sitting and consistent use of lumbar roll for additional back support throughout the day.     Time  3    Period  Weeks    Status  Achieved      PT SHORT TERM GOAL #3   Title  Pt will report making proper sleeping adjustments to decrease strain on the Lt hip throughout the night.     Time  3    Period  Weeks    Status  Achieved      PT SHORT TERM GOAL #4   Title  Pt will demo good understanding of proper posture and mechanics with lifting a small object (less than 5 lb) from the floor, 2/3 trials without PT cuing.     Baseline  improved but requires instruction for household activity    Time  3    Period  Weeks    Status  Partially Met        PT Long Term Goals - 02/13/18 1107      PT LONG TERM GOAL #1   Title  Pt will be independent with his advanced HEP to improve flexibility, strength and mechanics with daily activity after d/c from PT.     Baseline  pt taking time with this and has little confidence with this    Time  6    Period  Weeks  Status  On-going      PT LONG TERM GOAL #2   Title  Pt will report  atleast 75% improvement in his pain with sleeping through the night.     Baseline  80%    Time  6    Status  Achieved      PT LONG TERM GOAL #3   Title  Pt will report being able to sit for atleast 30 minutes without increase in pain, which will allow him to drive around town without limitation.     Time  6    Period  Weeks    Status  Achieved      PT LONG TERM GOAL #4   Title  Pt will be able to lift 10 lb box from the floor with proper mechanics and without increase in hip/back pain, 2/3 trials.     Baseline  pt requires  verbal cues currently.    Time  6    Period  Weeks    Status  On-going      PT LONG TERM GOAL #5   Title  Pt will demo consistency and independence with his advanced program to allow transition to a gym program and decrease his need for return to PT in the future.     Time  8    Period  Weeks    Status  New    Target Date  04/17/18            Plan - 02/13/18 1118    Clinical Impression Statement  Pt has made significant progress since beginning PT several weeks ago. He feels 80% improved, with only intermittent Lt lateral hip pain/numbness and tingling throughout the day. Pt has improved awareness of body mechanics with daily activity and lifting, however he does have remaining questions/concerns with this and more changes in position. His LE strength is 5/5 MMT however his trunk strength and control is still a limiting factor in his ability to safely complete daily activity without increase in back fatigue. He also has significant limitations in hip flexibility. He would benefit from continued skilled PT every other week to allow focus on remaining limitations and improve understanding and independence with exercise mechanics and progressions to ensure safe transition into a home/gym program.    Rehab Potential  Good    PT Frequency  Biweekly    PT Duration  8 weeks    PT Treatment/Interventions  ADLs/Self Care Home Management;Cryotherapy;Electrical Stimulation;Moist Heat;Therapeutic exercise;Patient/family education;Manual techniques;Passive range of motion;Taping;Dry needling;Neuromuscular re-education;Functional mobility training;Therapeutic activities    PT Next Visit Plan  f/u on HEP; hip flexor stretch; trunk strength and HEP progression for this    PT Home Exercise Plan  [id number]    Consulted and Agree with Plan of Care  Patient       Patient will benefit from skilled therapeutic intervention in order to improve the following deficits and impairments:  Decreased activity  tolerance, Decreased strength, Impaired flexibility, Postural dysfunction, Pain, Improper body mechanics, Decreased scar mobility, Decreased range of motion, Hypomobility, Increased muscle spasms  Visit Diagnosis: Other muscle spasm  Pain in left hip  Abnormal posture     Problem List Patient Active Problem List   Diagnosis Date Noted  . Left lumbar radiculopathy 10/10/2017  . Migraine without aura and without status migrainosus, not intractable 06/22/2017  . Vitamin D deficiency 05/28/2017  . Scrotal pain 05/28/2017  . Paroxysmal SVT (supraventricular tachycardia) ([place]) 05/28/2017  . Mild left inguinal hernia 11/23/2016  .  Chronic pelvic pain in male 09/28/2016  . Abdominal pain, left lower quadrant 09/28/2016  . Isolated proteinuria without specific morphologic lesion 06/27/2016  . Left testicular pain 06/27/2016  . Acute left flank pain 06/27/2016  . Epididymitis, left 06/05/2016  . Pelvic floor dysfunction 04/01/2016  . GERD (gastroesophageal reflux disease) 10/13/2015  . Gastroparesis 10/13/2015  . Intractable chronic migraine without aura and without status migrainosus 12/15/2013  . Hyperlipidemia 04/04/2012  . Mitral valve disease 07/15/2009  . IRRITABLE BOWEL SYNDROME 07/15/2009  . Palpitations 06/28/2009    11:42 AM,02/13/18 Sherol Dade PT, DPT [facility] [address], [place], [name], [address] Phone: [phone]   Fax:  [phone]  Name: Cohl Behrens MRN: [id number] Date of Birth: May 15, 1960

## 2018-02-18 ENCOUNTER — Encounter: Payer: PPO | Admitting: Physical Therapy

## 2018-02-18 ENCOUNTER — Other Ambulatory Visit: Payer: Self-pay | Admitting: *Deleted

## 2018-02-18 MED ORDER — GABAPENTIN 100 MG PO CAPS: 100.0000 mg | ORAL_CAPSULE | Freq: Every day | ORAL | 0 refills | Status: DC

## 2018-02-20 ENCOUNTER — Encounter: Payer: PPO | Admitting: Physical Therapy

## 2018-02-21 ENCOUNTER — Ambulatory Visit: Payer: PPO | Admitting: Family Medicine

## 2018-02-27 ENCOUNTER — Ambulatory Visit: Payer: PPO | Admitting: Physical Therapy

## 2018-03-06 ENCOUNTER — Ambulatory Visit: Payer: PPO | Attending: Family Medicine | Admitting: Physical Therapy

## 2018-03-06 ENCOUNTER — Encounter: Payer: Self-pay | Admitting: Physical Therapy

## 2018-03-06 DIAGNOSIS — R293 Abnormal posture: Secondary | ICD-10-CM | POA: Diagnosis not present

## 2018-03-06 DIAGNOSIS — M62838 Other muscle spasm: Secondary | ICD-10-CM | POA: Insufficient documentation

## 2018-03-06 DIAGNOSIS — M6281 Muscle weakness (generalized): Secondary | ICD-10-CM

## 2018-03-06 DIAGNOSIS — M25552 Pain in left hip: Secondary | ICD-10-CM | POA: Insufficient documentation

## 2018-03-06 NOTE — Therapy (Signed)
[facility] [address], [place], [name], [address] Phone: [phone]   Fax:  [phone]  Physical Therapy Treatment  Patient Details  Name: William [name] MRN: [id number] Date of Birth: [birth year] Referring Provider (PT): Hulan Saas, DO    Encounter Date: 03/06/2018  PT End of Session - 03/06/18 0951    Visit Number  9    Date for PT Re-Evaluation  02/14/18    Authorization Time Period  new: 02/15/18 to 04/17/18    Authorization - Visit Number  9    Authorization - Number of Visits  18    PT Start Time  3716    PT Stop Time  0930    PT Time Calculation (min)  43 min    Activity Tolerance  Patient tolerated treatment well;No increased pain    Behavior During Therapy  WFL for tasks assessed/performed       Past Medical History:  Diagnosis Date  . Allergy   . Anxiety   . Depression   . Gastroparesis   . Heart murmur   . Migraine headache   . Mitral valve prolapse     History reviewed. No pertinent surgical history.  There were no vitals filed for this visit.  Subjective Assessment - 03/06/18 0851    Subjective  Pt reports that things are going well. He has no issues with leg pain/symptoms. He was able to meet with a personal trainer and is waiting to hear from me before starting this. He has been able to focus on his lifting as well.     Pertinent History  anxiety, depression, migraines    Limitations  Lifting    How long can you sit comfortably?  15 minutes     Patient Stated Goals  decrease pain     Currently in Pain?  No/denies    Pain Onset  More than a month ago               [facility] Adult PT Treatment/Exercise - 03/06/18 0001      Exercises   Other Exercises   incline plank on hands x15 sec (tactile/verbal cues); Rt and Lt plank on side (elbow and knees bent) 2x10 sec       Lumbar Exercises: Quadruped   [place] Limitations  decreased flexion/extension noted x15  reps       Knee/Hip Exercises: Stretches   Other Knee/Hip Stretches  supine hamstring active stretch in 90/90 position x5 reps each LE; B gastroc stretch on slant board x30, standing x30 sec each side    Other Knee/Hip Stretches  Lt and Rt quad stretch in standaing x30 sec each              PT Education - 03/06/18 0951    Education Details  addressed questions/concerns with exercise technique    Person(s) Educated  Patient    Methods  Explanation;Verbal cues;Demonstration    Comprehension  Verbalized understanding;Returned demonstration       PT Short Term Goals - 02/13/18 1106      PT SHORT TERM GOAL #1   Title  Pt will be independent with his initial HEP to improve ROM, strength and body mechanics.     Time  3    Period  Weeks    Status  Achieved      PT SHORT TERM GOAL #2   Title  Pt will demo improved posture with sitting and consistent use of lumbar roll for additional  back support throughout the day.     Time  3    Period  Weeks    Status  Achieved      PT SHORT TERM GOAL #3   Title  Pt will report making proper sleeping adjustments to decrease strain on the Lt hip throughout the night.     Time  3    Period  Weeks    Status  Achieved      PT SHORT TERM GOAL #4   Title  Pt will demo good understanding of proper posture and mechanics with lifting a small object (less than 5 lb) from the floor, 2/3 trials without PT cuing.     Baseline  improved but requires instruction for household activity    Time  3    Period  Weeks    Status  Partially Met        PT Long Term Goals - 02/13/18 1107      PT LONG TERM GOAL #1   Title  Pt will be independent with his advanced HEP to improve flexibility, strength and mechanics with daily activity after d/c from PT.     Baseline  pt taking time with this and has little confidence with this    Time  6    Period  Weeks    Status  On-going      PT LONG TERM GOAL #2   Title  Pt will report  atleast 75% improvement in his  pain with sleeping through the night.     Baseline  80%    Time  6    Status  Achieved      PT LONG TERM GOAL #3   Title  Pt will report being able to sit for atleast 30 minutes without increase in pain, which will allow him to drive around town without limitation.     Time  6    Period  Weeks    Status  Achieved      PT LONG TERM GOAL #4   Title  Pt will be able to lift 10 lb box from the floor with proper mechanics and without increase in hip/back pain, 2/3 trials.     Baseline  pt requires verbal cues currently.    Time  6    Period  Weeks    Status  On-going      PT LONG TERM GOAL #5   Title  Pt will demo consistency and independence with his advanced program to allow transition to a gym program and decrease his need for return to PT in the future.     Time  8    Period  Weeks    Status  New    Target Date  04/17/18            Plan - 03/06/18 7681    Clinical Impression Statement  Pt is doing well, with some reports of intermittent low back discomfort throughout the day. Session focused on addressing pt questions regarding transition to working with a Physiological scientist. Therapist was able to address technique concerns and introduce trunk strengthening to ensure he is able to properly activate his abdominals. Pt does demonstrate tendency for Lt oblique activation more than the Rt secondary to Rt oblique weakness. This was further evident during side planks with increased difficulty on the Rt. Pt was encouraged to schedule his appointment with the personal trainer to establish his plan/goals and will follow up with PT following this to further progress  trunk stability and ensure safe start to his gym program.     Rehab Potential  Good    PT Frequency  Biweekly    PT Duration  8 weeks    PT Treatment/Interventions  ADLs/Self Care Home Management;Cryotherapy;Electrical Stimulation;Moist Heat;Therapeutic exercise;Patient/family education;Manual techniques;Passive range of  motion;Taping;Dry needling;Neuromuscular re-education;Functional mobility training;Therapeutic activities    PT Next Visit Plan  f/u on personal trainer session; hip flexor stretch; trunk strength (Rt oblique) and HEP progression for this    PT Home Exercise Plan  [id number]    Consulted and Agree with Plan of Care  Patient       Patient will benefit from skilled therapeutic intervention in order to improve the following deficits and impairments:  Decreased activity tolerance, Decreased strength, Impaired flexibility, Postural dysfunction, Pain, Improper body mechanics, Decreased scar mobility, Decreased range of motion, Hypomobility, Increased muscle spasms  Visit Diagnosis: Other muscle spasm  Pain in left hip  Abnormal posture  Muscle weakness (generalized)     Problem List Patient Active Problem List   Diagnosis Date Noted  . Left lumbar radiculopathy 10/10/2017  . Migraine without aura and without status migrainosus, not intractable 06/22/2017  . Vitamin D deficiency 05/28/2017  . Scrotal pain 05/28/2017  . Paroxysmal SVT (supraventricular tachycardia) ([place]) 05/28/2017  . Mild left inguinal hernia 11/23/2016  . Chronic pelvic pain in male 09/28/2016  . Abdominal pain, left lower quadrant 09/28/2016  . Isolated proteinuria without specific morphologic lesion 06/27/2016  . Left testicular pain 06/27/2016  . Acute left flank pain 06/27/2016  . Epididymitis, left 06/05/2016  . Pelvic floor dysfunction 04/01/2016  . GERD (gastroesophageal reflux disease) 10/13/2015  . Gastroparesis 10/13/2015  . Intractable chronic migraine without aura and without status migrainosus 12/15/2013  . Hyperlipidemia 04/04/2012  . Mitral valve disease 07/15/2009  . IRRITABLE BOWEL SYNDROME 07/15/2009  . Palpitations 06/28/2009    9:55 AM,03/06/18 Sherol Dade PT, DPT [facility] [address] [place], [name], [address] Phone: [phone]   Fax:  [phone]  Name: William [name] MRN: [id number] Date of Birth: 04-11-1961

## 2018-03-11 ENCOUNTER — Encounter: Payer: PPO | Admitting: Physical Therapy

## 2018-03-13 ENCOUNTER — Encounter: Payer: Self-pay | Admitting: Family Medicine

## 2018-03-13 ENCOUNTER — Other Ambulatory Visit: Payer: Self-pay

## 2018-03-13 ENCOUNTER — Ambulatory Visit (INDEPENDENT_AMBULATORY_CARE_PROVIDER_SITE_OTHER): Payer: PPO | Admitting: Family Medicine

## 2018-03-13 VITALS — BP 132/70 | HR 97 | Temp 98.0°F | Resp 16 | Ht 71.46 in | Wt 149.0 lb

## 2018-03-13 DIAGNOSIS — N50812 Left testicular pain: Secondary | ICD-10-CM

## 2018-03-13 DIAGNOSIS — G8929 Other chronic pain: Secondary | ICD-10-CM

## 2018-03-13 DIAGNOSIS — R800 Isolated proteinuria: Secondary | ICD-10-CM

## 2018-03-13 DIAGNOSIS — R1032 Left lower quadrant pain: Secondary | ICD-10-CM | POA: Diagnosis not present

## 2018-03-13 DIAGNOSIS — R102 Pelvic and perineal pain: Secondary | ICD-10-CM

## 2018-03-13 DIAGNOSIS — G43719 Chronic migraine without aura, intractable, without status migrainosus: Secondary | ICD-10-CM

## 2018-03-13 DIAGNOSIS — N5082 Scrotal pain: Secondary | ICD-10-CM

## 2018-03-13 DIAGNOSIS — R109 Unspecified abdominal pain: Secondary | ICD-10-CM

## 2018-03-13 LAB — POCT URINALYSIS DIP (MANUAL ENTRY)
BILIRUBIN UA: NEGATIVE
Blood, UA: NEGATIVE
Glucose, UA: NEGATIVE mg/dL
Ketones, POC UA: NEGATIVE mg/dL
LEUKOCYTES UA: NEGATIVE
NITRITE UA: NEGATIVE
PH UA: 7 (ref 5.0–8.0)
Protein Ur, POC: NEGATIVE mg/dL
Spec Grav, UA: 1.015 (ref 1.010–1.025)
Urobilinogen, UA: 0.2 E.U./dL

## 2018-03-13 LAB — POC MICROSCOPIC URINALYSIS (UMFC): MUCUS RE: ABSENT

## 2018-03-13 MED ORDER — GALCANEZUMAB-GNLM 120 MG/ML ~~LOC~~ SOAJ: SUBCUTANEOUS | 11 refills | Status: DC

## 2018-03-13 NOTE — Patient Instructions (Signed)
° ° ° °  If you have lab work done today you will be contacted with your lab results within the next 2 weeks.  If you have not heard from us then please contact us. The fastest way to get your results is to register for [name]. ° ° °IF you received an x-ray today, you will receive an invoice from [facility]. Please contact [facility] at [phone] with questions or concerns regarding your invoice.  ° °IF you received labwork today, you will receive an invoice from [name]. Please contact [name] at [phone] with questions or concerns regarding your invoice.  ° °Our billing staff will not be able to assist you with questions regarding bills from these companies. ° °You will be contacted with the lab results as soon as they are available. The fastest way to get your results is to activate your [name] account. Instructions are located on the last page of this paperwork. If you have not heard from us regarding the results in 2 weeks, please contact this office. °  ° ° ° °

## 2018-03-13 NOTE — Progress Notes (Signed)
Subjective:    Patient: William [name] "William [name]"  DOB: [birth year]; 57 y.o.   MRN: [id number]  Chief Complaint  Patient presents with  . Migraine    pt states the medication has not been helping     HPIb  His back has improved after 2 injections and seeing PT. Going to start Then considering transferring to back to the pelvic floor PT with Austin Miles as still feels he has issues. Has  Seen Dr. Luberta [name] and Dr. Felipa [name] but didn't feel listen to  Saw Dr. Melford [name] as HAs worsening who was referred to neurologist Dr. Sima [name] who is now out of his network. Saw Dr. Sima [name] in 2010 who rec botox but pt has reluctance about this. Saw [place] who referred him to [facility] Having migraines 3-4x/wk which he treats with flexeril, tramadol, and butalbital-apap-caffeine.  He is weaning off gabapentin as he is worried it was the trigger that worsened the migraines but no longer needing the gabapentin for his back as much anyway as PT is kicking in. Concerned about relpax causing cardiac issues - heart flushing sensations when tried it years prior so never picked up. HAs are photo- and phonophobic and will improve w/ sleep in dark room 30-40% of time but often to nauseated to sleep then. No preceeding aura. Last for 3-4 hrs w/ blurred vision accompanying.    Medical History Past Medical History:  Diagnosis Date  . Allergy   . Anxiety   . Depression   . Gastroparesis   . Heart murmur   . Migraine headache   . Mitral valve prolapse    History reviewed. No pertinent surgical history. Current Outpatient Medications on File Prior to Visit  Medication Sig Dispense Refill  . butalbital-acetaminophen-caffeine (FIORICET, ESGIC) 50-325-40 MG tablet Take 1 tablet by mouth every 4 (four) hours as needed for headache. 60 tablet 1  . cyclobenzaprine (FLEXERIL) 10 MG tablet Take 1 tablet (10 mg total) by mouth 3 (three) times daily as needed for muscle spasms. 90 tablet 3  . diltiazem  (CARDIZEM CD) 120 MG 24 hr capsule Take 1 capsule (120 mg total) by mouth daily. 30 capsule 11  . diltiazem (CARDIZEM) 30 MG tablet Take 1 tablet (30 mg total) by mouth as needed. TAKE FOR PALPITATIONS 30 tablet 11  . eletriptan (RELPAX) 40 MG tablet Take by mouth.    . fluticasone (FLONASE) 50 MCG/ACT nasal spray Place 2 sprays daily into both nostrils. 16 g 6  . gabapentin (NEURONTIN) 100 MG capsule Take 1 capsule (100 mg total) by mouth at bedtime. 90 capsule 0  . gabapentin (NEURONTIN) 300 MG capsule nightly 30 capsule 3  . gabapentin (NEURONTIN) 300 MG capsule Take by mouth.    . methocarbamol (ROBAXIN) 500 MG tablet Take 1 tablet (500 mg total) by mouth 4 (four) times daily. For muscle spasm 60 tablet 1  . ondansetron (ZOFRAN) 4 MG tablet Take by mouth.    . promethazine (PHENERGAN) 12.5 MG tablet Take 1 tablet (12.5 mg total) by mouth every 8 (eight) hours as needed (migraine headache). 20 tablet 0  . Vitamin D, Ergocalciferol, (DRISDOL) 50000 units CAPS capsule Take 1 capsule (50,000 Units total) by mouth every 7 (seven) days. 12 capsule 1   No current facility-administered medications on file prior to visit.    Allergies  Allergen Reactions  . Erythromycin Anaphylaxis and Nausea And Vomiting  . Nalbuphine Other (See Comments)    States loss of consciousness   . Cefdinir Diarrhea,  Nausea And Vomiting and Rash  . Cephalexin Diarrhea and Nausea And Vomiting  . Cephalosporins Diarrhea and Nausea And Vomiting  . Hydrocodone-Acetaminophen Nausea And Vomiting  . Ketorolac Nausea And Vomiting  . Prochlorperazine Edisylate Other (See Comments)    uncontrollable shake - tremor   . Restasis [Cyclosporine] Other (See Comments)    migraines  . Tyloxapol Swelling  . Propranolol Diarrhea  . Topamax [Topiramate] Diarrhea and Nausea And Vomiting  . Zetia [Ezetimibe] Diarrhea  . Doxycycline Hyclate Nausea Only and Other (See Comments)    abd pain  . Iodinated Diagnostic Agents Other (See  Comments)    Lightheaded, flushed feeling (explained to patient these are normal side effects of IV iodinated contrast, not allergies, but he wanted this noted in the epic; he tolerated epidural steroid injections w/ contrast w/o difficulty)  . Ketoconazole Itching  . Latex Itching  . Neosporin [Neomycin-Bacitracin Zn-Polymyx] Rash  . Oxycodone-Acetaminophen Other (See Comments)    Tylox caused constipation  . Prednisone Nausea And Vomiting    Oral route, only   Family History  Problem Relation Age of Onset  . Hypertension Mother   . Hypertension Brother    Social History   Socioeconomic History  . Marital status: Married    Spouse name: Not on file  . Number of children: Not on file  . Years of education: Not on file  . Highest education level: Not on file  Occupational History  . Not on file  Social Needs  . Financial resource strain: Not on file  . Food insecurity:    Worry: Not on file    Inability: Not on file  . Transportation needs:    Medical: Not on file    Non-medical: Not on file  Tobacco Use  . Smoking status: Never Smoker  . Smokeless tobacco: Never Used  Substance and Sexual Activity  . Alcohol use: No    Alcohol/week: 0.0 standard drinks  . Drug use: No  . Sexual activity: Yes    Partners: Female  Lifestyle  . Physical activity:    Days per week: Not on file    Minutes per session: Not on file  . Stress: Not on file  Relationships  . Social connections:    Talks on phone: Not on file    Gets together: Not on file    Attends religious service: Not on file    Active member of club or organization: Not on file    Attends meetings of clubs or organizations: Not on file    Relationship status: Not on file  Other Topics Concern  . Not on file  Social History Narrative  . Not on file   Depression screen [facility] 2/9 03/13/2018 12/05/2017 11/20/2017 11/01/2017 10/16/2017  Decreased Interest 0 0 0 0 0  Down, Depressed, Hopeless 0 0 0 0 0  PHQ - 2 Score 0 0 0  0 0  Altered sleeping - - - - -  Tired, decreased energy - - - - -  Change in appetite - - - - -  Feeling bad or failure about yourself  - - - - -  Trouble concentrating - - - - -  Moving slowly or fidgety/restless - - - - -  Suicidal thoughts - - - - -  PHQ-9 Score - - - - -  Some recent data might be hidden    ROS As noted in HPI  Objective:  BP 132/70   Pulse 97   Temp 98 F (  36.7 C) (Oral)   Resp 16   Ht 5' 11.46" (1.815 m)   Wt 149 lb (67.6 kg)   SpO2 99%   BMI 20.52 kg/m  Physical Exam  Constitutional: He is oriented to person, place, and time. He appears well-developed and well-nourished. No distress.  HENT:  Head: Normocephalic and atraumatic.  Eyes: Pupils are equal, round, and reactive to light. Conjunctivae are normal. No scleral icterus.  Neck: Normal range of motion. Neck supple. No thyromegaly present.  Cardiovascular: Normal rate, regular rhythm, normal heart sounds and intact distal pulses.  Pulmonary/Chest: Effort normal and breath sounds normal. No respiratory distress.  Musculoskeletal: He exhibits no edema.  Lymphadenopathy:    He has no cervical adenopathy.  Neurological: He is alert and oriented to person, place, and time.  Skin: Skin is warm and dry. He is not diaphoretic.  Psychiatric: He has a normal mood and affect. His behavior is normal.    [facility] Office Visit on 03/13/2018  Component Date Value Ref Range Status  . Color, UA 03/13/2018 yellow  yellow Final  . Clarity, UA 03/13/2018 clear  clear Final  . Glucose, UA 03/13/2018 negative  negative mg/dL Final  . Bilirubin, UA 03/13/2018 negative  negative Final  . Ketones, POC UA 03/13/2018 negative  negative mg/dL Final  . Spec Grav, UA 03/13/2018 1.015  1.010 - 1.025 Final  . Blood, UA 03/13/2018 negative  negative Final  . pH, UA 03/13/2018 7.0  5.0 - 8.0 Final  . Protein Ur, POC 03/13/2018 negative  negative mg/dL Final  . Urobilinogen, UA 03/13/2018 0.2  0.2 or 1.0 E.U./dL Final   . Nitrite, UA 03/13/2018 Negative  Negative Final  . Leukocytes, UA 03/13/2018 Negative  Negative Final  . WBC,UR,HPF,POC 03/13/2018 None  None WBC/hpf Final  . RBC,UR,HPF,POC 03/13/2018 None  None RBC/hpf Final  . Bacteria 03/13/2018 None  None, Too numerous to count Final  . Mucus 03/13/2018 Absent  Absent Final  . Epithelial Cells, UR Per Microscopy 03/13/2018 None  None, Too numerous to count cells/hpf Final  . Creatinine, Urine 03/13/2018 74.9  Not Estab. mg/dL Final  . Protein, Ur 03/13/2018 4.9  Not Estab. mg/dL Final  . Protein/Creat Ratio 03/13/2018 65  0 - 200 mg/g creat Final     Assessment & Plan:   1. Isolated proteinuria without specific morphologic lesion   2. Left testicular pain - chronic for many yrs; referred to urology - rec Dr. Erlene Quan in [place], restart pelvic floor PT - call to make an appt.  3. Acute left flank pain   4. Chronic pelvic pain in male   5. Abdominal pain, left lower quadrant   6. Scrotal pain   7. Intractable chronic migraine without aura and without status migrainosus - was referred to headache treatment center w/ dr. Domingo Cocking but not sure he wants to do - perhaps rather see Dr[facility] Loretta Plume at [facility]. Start trial of biologic Emgality   Patient will continue on current chronic medications other than changes noted above, so ok to refill when needed.   See after visit summary for patient specific instructions.  Orders Placed This Encounter  Procedures  . Protein / Creatinine Ratio, Urine  . Ambulatory referral to Urology    Referral Priority:   Routine    Referral Type:   Consultation    Referral Reason:   Specialty Services Required    Referred to Provider:   Hollice Espy, MD    Requested Specialty:   Urology  Number of Visits Requested:   1  . POCT urinalysis dipstick  . POCT Microscopic Urinalysis ([facility])    Meds ordered this encounter  Medications  . Galcanezumab-gnlm (EMGALITY) 120 MG/ML SOAJ    Sig: Inject 240 mg into  the skin every 30 (thirty) days for 29 days, THEN 120 mg every 30 (thirty) days.    Dispense:  4 pen    Refill:  11    Patient verbalized to me that they understand the following: diagnosis, what is being done for them, what to expect and what should be done at home.  Their questions have been answered. They understand that I am unable to predict every possible medication interaction or adverse outcome and that if any unexpected symptoms arise, they should contact us and their pharmacist, as well as never hesitate to seek urgent/emergent care at [facility] or ER if they think it might be warranted.    Over 40 min spent in face-to-face evaluation of and consultation with patient and coordination of care.  Over 50% of this time was spent counseling this patient regarding above.  [name], MD, MPH [facility] [address] [place], [place]  [address] [phone] Office phone  [phone] Office fax  03/13/18 9:52 AM

## 2018-03-14 LAB — PROTEIN / CREATININE RATIO, URINE
Creatinine, Urine: 74.9 mg/dL
PROTEIN UR: 4.9 mg/dL
PROTEIN/CREAT RATIO: 65 mg/g{creat} (ref 0–200)

## 2018-03-17 ENCOUNTER — Telehealth: Payer: Self-pay | Admitting: Family Medicine

## 2018-03-17 NOTE — Telephone Encounter (Signed)
Called and spoke with pt regarding their appt with Dr. Brigitte Pulse on 04/01/18. I explained that Dr. Brigitte Pulse is unavailable that day and I was able to get pt rescheduled for 04/04/18 with Dr. Brigitte Pulse at the same time as the original appt. I advised of time, building number and late policy.  Pt was agreeable and acknowledged.

## 2018-03-21 ENCOUNTER — Ambulatory Visit: Payer: PPO | Admitting: Physical Therapy

## 2018-03-24 ENCOUNTER — Ambulatory Visit: Payer: Self-pay | Admitting: Urology

## 2018-03-25 ENCOUNTER — Encounter: Payer: Self-pay | Admitting: Physical Therapy

## 2018-03-25 ENCOUNTER — Ambulatory Visit: Payer: PPO | Admitting: Physical Therapy

## 2018-03-25 DIAGNOSIS — M25552 Pain in left hip: Secondary | ICD-10-CM

## 2018-03-25 DIAGNOSIS — M62838 Other muscle spasm: Secondary | ICD-10-CM | POA: Diagnosis not present

## 2018-03-25 DIAGNOSIS — R293 Abnormal posture: Secondary | ICD-10-CM

## 2018-03-25 DIAGNOSIS — M6281 Muscle weakness (generalized): Secondary | ICD-10-CM

## 2018-03-25 NOTE — Therapy (Signed)
[facility] [address] [place], [name], [address] Phone: [phone]   Fax:  [phone]  Physical Therapy Treatment  Patient Details  Name: William [name] MRN: [id number] Date of Birth: May 03, 1960 Referring Provider (PT): Hulan Saas, DO    Encounter Date: 03/25/2018  PT End of Session - 03/25/18 1048    Visit Number  10    Date for PT Re-Evaluation  02/14/18    Authorization Time Period  new: 02/15/18 to 04/17/18    Authorization - Visit Number  10    Authorization - Number of Visits  18    PT Start Time  1013    PT Stop Time  4431    PT Time Calculation (min)  38 min    Activity Tolerance  Patient tolerated treatment well;No increased pain    Behavior During Therapy  WFL for tasks assessed/performed       Past Medical History:  Diagnosis Date  . Allergy   . Anxiety   . Depression   . Gastroparesis   . Heart murmur   . Migraine headache   . Mitral valve prolapse     History reviewed. No pertinent surgical history.  There were no vitals filed for this visit.  Subjective Assessment - 03/25/18 1020    Subjective  Pt reports that things are going well. He has health aids for his mom at home which will help him have more time to work on his HEP. He is waiting to hear back from the personal trainer.     Pertinent History  anxiety, depression, migraines    Limitations  Lifting    How long can you sit comfortably?  15 minutes     Patient Stated Goals  decrease pain     Pain Onset  More than a month ago                       [facility] Adult PT Treatment/Exercise - 03/25/18 0001      Knee/Hip Exercises: Stretches   Other Knee/Hip Stretches  closed chain DF weight shift Lt/Rt x10 reps       Knee/Hip Exercises: Standing   Other Standing Knee Exercises  BLE squat with heels lifted and front hold 10# x10 reps, 5x10 sec hold at bottom of squat      Manual Therapy   Manual Therapy   Joint mobilization    Manual therapy comments  increase in closed chain ankle DF on Lt from 6 deg to 20 deg following treatment    Joint Mobilization  Lt ankle DW MWM 2x10 reps, Rt ankle DF MWM x10 reps              PT Education - 03/25/18 1046    Education Details  technique with squats; encouraged pt to meet with trainer to improve his confidence with his own program at the gym    Person(s) Educated  Patient    Methods  Explanation;Verbal cues;Demonstration    Comprehension  Verbalized understanding;Returned demonstration       PT Short Term Goals - 03/25/18 1049      PT SHORT TERM GOAL #1   Title  Pt will be independent with his initial HEP to improve ROM, strength and body mechanics.     Time  3    Period  Weeks    Status  Achieved      PT SHORT TERM GOAL #2   Title  Pt will  demo improved posture with sitting and consistent use of lumbar roll for additional back support throughout the day.     Time  3    Period  Weeks    Status  Achieved      PT SHORT TERM GOAL #3   Title  Pt will report making proper sleeping adjustments to decrease strain on the Lt hip throughout the night.     Time  3    Period  Weeks    Status  Achieved      PT SHORT TERM GOAL #4   Title  Pt will demo good understanding of proper posture and mechanics with lifting a small object (less than 5 lb) from the floor, 2/3 trials without PT cuing.     Baseline  pt able to lift properly during session    Time  3    Period  Weeks    Status  Achieved        PT Long Term Goals - 02/13/18 1107      PT LONG TERM GOAL #1   Title  Pt will be independent with his advanced HEP to improve flexibility, strength and mechanics with daily activity after d/c from PT.     Baseline  pt taking time with this and has little confidence with this    Time  6    Period  Weeks    Status  On-going      PT LONG TERM GOAL #2   Title  Pt will report  atleast 75% improvement in his pain with sleeping through the night.      Baseline  80%    Time  6    Status  Achieved      PT LONG TERM GOAL #3   Title  Pt will report being able to sit for atleast 30 minutes without increase in pain, which will allow him to drive around town without limitation.     Time  6    Period  Weeks    Status  Achieved      PT LONG TERM GOAL #4   Title  Pt will be able to lift 10 lb box from the floor with proper mechanics and without increase in hip/back pain, 2/3 trials.     Baseline  pt requires verbal cues currently.    Time  6    Period  Weeks    Status  On-going      PT LONG TERM GOAL #5   Title  Pt will demo consistency and independence with his advanced program to allow transition to a gym program and decrease his need for return to PT in the future.     Time  8    Period  Weeks    Status  New    Target Date  04/17/18            Plan - 03/25/18 1055    Clinical Impression Statement  Pt is doing well, reporting no increase in low back pain over the past several weeks. He did notice back fatigue with vacuuming which did not last long during the rest of the day. He is planning to meet with a personal trainer in the next week or so, which will help with his transition to a gym program. Pt had several concerns with exercise technique so this was addressed. Pt's closed chain ankle dorsiflexion limits his ability maintain proper mechanics during squat, however this improved following ankle mobilization and addition of small heel  lift. Pt had atleast 10 deg improvement in ROM end of session. Will follow up on pt's transition to a routine with his personal trainer and plan on possible d/c next visit assuming no new issues/concerns arise.     Rehab Potential  Good    PT Frequency  Biweekly    PT Duration  8 weeks    PT Treatment/Interventions  ADLs/Self Care Home Management;Cryotherapy;Electrical Stimulation;Moist Heat;Therapeutic exercise;Patient/family education;Manual techniques;Passive range of motion;Taping;Dry  needling;Neuromuscular re-education;Functional mobility training;Therapeutic activities    PT Next Visit Plan  f/u on personal trainer session; hip flexor stretch; trunk strength (Rt oblique) and HEP progression for this; possible d/c    PT Home Exercise Plan  [email or web address]    Consulted and Agree with Plan of Care  Patient       Patient will benefit from skilled therapeutic intervention in order to improve the following deficits and impairments:  Decreased activity tolerance, Decreased strength, Impaired flexibility, Postural dysfunction, Pain, Improper body mechanics, Decreased scar mobility, Decreased range of motion, Hypomobility, Increased muscle spasms  Visit Diagnosis: Other muscle spasm  Pain in left hip  Abnormal posture  Muscle weakness (generalized)     Problem List Patient Active Problem List   Diagnosis Date Noted  . Left lumbar radiculopathy 10/10/2017  . Migraine without aura and without status migrainosus, not intractable 06/22/2017  . Vitamin D deficiency 05/28/2017  . Scrotal pain 05/28/2017  . Paroxysmal SVT (supraventricular tachycardia) ([place]) 05/28/2017  . Mild left inguinal hernia 11/23/2016  . Chronic pelvic pain in male 09/28/2016  . Abdominal pain, left lower quadrant 09/28/2016  . Isolated proteinuria without specific morphologic lesion 06/27/2016  . Left testicular pain 06/27/2016  . Acute left flank pain 06/27/2016  . Epididymitis, left 06/05/2016  . Pelvic floor dysfunction 04/01/2016  . GERD (gastroesophageal reflux disease) 10/13/2015  . Gastroparesis 10/13/2015  . Intractable chronic migraine without aura and without status migrainosus 12/15/2013  . Hyperlipidemia 04/04/2012  . Mitral valve disease 07/15/2009  . IRRITABLE BOWEL SYNDROME 07/15/2009  . Palpitations 06/28/2009     10:57 AM,03/25/18 Sherol Dade PT, DPT [facility] [address], [place], [name], [address] Phone: [phone]   Fax:  [phone]  Name: Leston Schueller MRN: [id number] Date of Birth: Sep 05, 1960

## 2018-03-31 DIAGNOSIS — Z23 Encounter for immunization: Secondary | ICD-10-CM | POA: Diagnosis not present

## 2018-04-01 ENCOUNTER — Ambulatory Visit: Payer: PPO | Admitting: Family Medicine

## 2018-04-04 ENCOUNTER — Other Ambulatory Visit: Payer: Self-pay | Admitting: Physician Assistant

## 2018-04-04 ENCOUNTER — Ambulatory Visit: Payer: PPO | Admitting: Family Medicine

## 2018-04-10 ENCOUNTER — Ambulatory Visit: Payer: PPO | Attending: Family Medicine | Admitting: Physical Therapy

## 2018-04-10 ENCOUNTER — Encounter: Payer: Self-pay | Admitting: Physical Therapy

## 2018-04-10 DIAGNOSIS — R293 Abnormal posture: Secondary | ICD-10-CM | POA: Insufficient documentation

## 2018-04-10 DIAGNOSIS — M62838 Other muscle spasm: Secondary | ICD-10-CM | POA: Diagnosis not present

## 2018-04-10 DIAGNOSIS — M25552 Pain in left hip: Secondary | ICD-10-CM | POA: Insufficient documentation

## 2018-04-10 NOTE — Patient Instructions (Signed)
Access Code: [email or web address]  URL: [email or web address]  Date: 04/10/2018  Prepared by: Sherol Dade   Exercises  Sidelying Thoracic Rotation - 10 reps - 2-3x daily - 7x weekly  Supine Gluteus Stretch - 3 sets - 30 hold - 1x daily - 7x weekly  Supine Double Knee to Chest - 3 sets - 30 hold - 1x daily - 7x weekly  Seated Gluteal Stretch - 10 reps - 3 sets - 1x daily - 7x weekly  Seated Piriformis Stretch - 3 sets - 30 hold - 1x daily - 7x weekly  Seated Hamstring Stretch - 3 sets - 30 hold - 1x daily - 7x weekly  Plank with Elbows on Table - 15 reps - 1 sets - 1x daily - 7x weekly  Cat Cow - 15 reps - 1 sets - 1x daily - 7x weekly  Seated High Lat Pull Down with Overhead Anchored Resistance - 10 reps - 3 sets - 1x daily - 7x weekly  Quadriceps Stretch with Chair - 2 reps - 20 hold - 1x daily - 7x weekly    [facility] [address], [place], [place] [address] Phone # [phone] Fax [phone]

## 2018-04-10 NOTE — Therapy (Signed)
[facility] [address] [place], [name], [address] Phone: [phone]   Fax:  [phone]  Physical Therapy Treatment/Discharge  Patient Details  Name: William [name] MRN: [id number] Date of Birth: 1960-11-24 Referring Provider (PT): Hulan Saas, DO    Encounter Date: 04/10/2018  PT End of Session - 04/10/18 0945    Visit Number  11    Date for PT Re-Evaluation  02/14/18    Authorization Time Period  new: 02/15/18 to 04/17/18    Authorization - Visit Number  11    Authorization - Number of Visits  18    PT Start Time  0930    PT Stop Time  1013    PT Time Calculation (min)  43 min    Activity Tolerance  Patient tolerated treatment well;No increased pain    Behavior During Therapy  WFL for tasks assessed/performed       Past Medical History:  Diagnosis Date  . Allergy   . Anxiety   . Depression   . Gastroparesis   . Heart murmur   . Migraine headache   . Mitral valve prolapse     History reviewed. No pertinent surgical history.  There were no vitals filed for this visit.  Subjective Assessment - 04/10/18 0932    Subjective  Pt reports that things are going ok. He was not able to meet with his personal trainer over the past several weeks because he has been busy. He is atleast 90% improved.     Pertinent History  anxiety, depression, migraines    Limitations  Lifting    How long can you sit comfortably?  15 minutes     Patient Stated Goals  decrease pain     Currently in Pain?  No/denies    Pain Onset  More than a month ago         [facility] PT Assessment - 04/10/18 0001      Assessment   Medical Diagnosis  Lumbar radiculopathy    Referring Provider (PT)  Hulan Saas, DO     Next MD Visit  might be going back soon     Prior Therapy  Pelvic floor PT      Precautions   Precautions  None      Restrictions   Weight Bearing Restrictions  No      Balance Screen   Has the patient  fallen in the past 6 months  No    Has the patient had a decrease in activity level because of a fear of falling?   No    Is the patient reluctant to leave their home because of a fear of falling?   No      Prior Function   Leisure  currently his mother lives with him       Cognition   Overall Cognitive Status  Within Functional Limits for tasks assessed      Observation/Other Assessments   Observations  sitting slouched in chair       Posture/Postural Control   Posture Comments  forward head, rounded shoulders       AROM   Lumbar Flexion  pain free    Lumbar Extension  pain free    Lumbar - Right Rotation  limited, pain free    Lumbar - Left Rotation  limited, pain free      Strength   Right Hip Flexion  5/5    Right Hip Extension  5/5  Right Hip ABduction  5/5    Left Hip Flexion  5/5    Left Hip Extension  5/5    Left Hip ABduction  5/5    Right Knee Flexion  5/5    Right Knee Extension  5/5    Left Knee Flexion  5/5    Left Knee Extension  5/5    Right Ankle Dorsiflexion  5/5    Left Ankle Dorsiflexion  5/5      Flexibility   Soft Tissue Assessment /Muscle Length  yes    Quadriceps  (+) thomas test for hip quadriceps Lt and Rt (Rt more limited than Lt)                   [facility] Adult PT Treatment/Exercise - 04/10/18 0001      Self-Care   Posture  seated posture correction with lumbar roll      Lumbar Exercises: Seated   Other Seated Lumbar Exercises  seated lat pulldown with black TB x10 reps       Lumbar Exercises: Sidelying   Other Sidelying Lumbar Exercises  thoracic rotation open book stretch x5 reps each direction      Lumbar Exercises: Prone   Other Prone Lumbar Exercises  incline plank hold on elbows x30 sec; side plank on elbow and knees x20 sec Lt and Rt       Lumbar Exercises: Quadruped   Madcat/Old Horse  15 reps    Madcat/Old Horse Limitations  verbal cues needed initially       Knee/Hip Exercises: Stretches   Passive Hamstring  Stretch  Both;1 rep;30 seconds    Passive Hamstring Stretch Limitations  seated     Quad Stretch  Both;1 rep;20 seconds    Quad Stretch Limitations  standing     Other Knee/Hip Stretches  Lt and Rt glute stretch 2x15 sec each    Other Knee/Hip Stretches  seated glute stretch              PT Education - 04/10/18 [phone]    Education Details  benefits of completing regular exercise program at home with his HEP or trainer     Person(s) Educated  Patient    Methods  Explanation    Comprehension  Verbalized understanding       PT Short Term Goals - 04/10/18 1011      PT SHORT TERM GOAL #1   Title  Pt will be independent with his initial HEP to improve ROM, strength and body mechanics.     Time  3    Period  Weeks    Status  Achieved      PT SHORT TERM GOAL #2   Title  Pt will demo improved posture with sitting and consistent use of lumbar roll for additional back support throughout the day.     Time  3    Period  Weeks    Status  Achieved      PT SHORT TERM GOAL #3   Title  Pt will report making proper sleeping adjustments to decrease strain on the Lt hip throughout the night.     Time  3    Period  Weeks    Status  Achieved      PT SHORT TERM GOAL #4   Title  Pt will demo good understanding of proper posture and mechanics with lifting a small object (less than 5 lb) from the floor, 2/3 trials without PT cuing.     Baseline  pt able to lift properly during session    Time  3    Period  Weeks    Status  Achieved        PT Long Term Goals - 04/10/18 1012      PT LONG TERM GOAL #1   Title  Pt will be independent with his advanced HEP to improve flexibility, strength and mechanics with daily activity after d/c from PT.     Baseline  pt feels more confident     Time  6    Period  Weeks    Status  Achieved      PT LONG TERM GOAL #2   Title  Pt will report  atleast 75% improvement in his pain with sleeping through the night.     Baseline  90% improvement     Time  6     Status  Achieved      PT LONG TERM GOAL #3   Title  Pt will report being able to sit for atleast 30 minutes without increase in pain, which will allow him to drive around town without limitation.     Time  6    Period  Weeks    Status  Achieved      PT LONG TERM GOAL #4   Title  Pt will be able to lift 10 lb box from the floor with proper mechanics and without increase in hip/back pain, 2/3 trials.     Baseline  pt able to do this    Time  6    Period  Weeks    Status  Achieved      PT LONG TERM GOAL #5   Title  Pt will demo consistency and independence with his advanced program to allow transition to a gym program and decrease his need for return to PT in the future.     Baseline  pt unable to get appointment with personal trainer     Time  8    Period  Weeks    Status  Partially Met            Plan - 04/10/18 1009    Clinical Impression Statement  Pt has progress well since beginning PT. He has met all of his short term goals, as well as nearly all of his long term goals since beginning PT. Although pt has had difficulty attending all of his appointments because of family health issues, he feels that he is atleast 90% improved. He completes a majority of his HEP throughout the week and has an increased awareness of his body mechanics. Pt has made some adjustments to his seated posture in the car and while at home but continues to require reminders for this at his sessions. Pt's POC was originally extended secondary to his lack of confidence with his HEP and self-progressions of exercises. Pt has not been able to set up an appointment with his personal trainer, but has been encouraged to do so moving forward. His PT HEP was updated and reviewed this visit and therapist discussed importance of completing regularly in order to promote strength gains. He was understanding of this and agreeable with d/c at this time.     Rehab Potential  Good    PT Frequency  Biweekly    PT Duration   8 weeks    PT Treatment/Interventions  ADLs/Self Care Home Management;Cryotherapy;Electrical Stimulation;Moist Heat;Therapeutic exercise;Patient/family education;Manual techniques;Passive range of motion;Taping;Dry needling;Neuromuscular re-education;Functional mobility training;Therapeutic activities    PT Next  Visit Plan  d/c with HEP    PT Home Exercise Plan  [id number]    Consulted and Agree with Plan of Care  Patient       Patient will benefit from skilled therapeutic intervention in order to improve the following deficits and impairments:  Decreased activity tolerance, Decreased strength, Impaired flexibility, Postural dysfunction, Pain, Improper body mechanics, Decreased scar mobility, Decreased range of motion, Hypomobility, Increased muscle spasms  Visit Diagnosis: Other muscle spasm  Pain in left hip  Abnormal posture  PHYSICAL THERAPY DISCHARGE SUMMARY  Visits from Start of Care: 11  Current functional level related to goals / functional outcomes: See above for more details   Remaining deficits: See above for more details    Education / Equipment: See above for more details  Plan: Patient agrees to discharge.  Patient goals were partially met. Patient is being discharged due to being pleased with the current functional level.  ?????        Problem List Patient Active Problem List   Diagnosis Date Noted  . Left lumbar radiculopathy 10/10/2017  . Migraine without aura and without status migrainosus, not intractable 06/22/2017  . Vitamin D deficiency 05/28/2017  . Scrotal pain 05/28/2017  . Paroxysmal SVT (supraventricular tachycardia) ([place]) 05/28/2017  . Mild left inguinal hernia 11/23/2016  . Chronic pelvic pain in male 09/28/2016  . Abdominal pain, left lower quadrant 09/28/2016  . Isolated proteinuria without specific morphologic lesion 06/27/2016  . Left testicular pain 06/27/2016  . Acute left flank pain 06/27/2016  . Epididymitis, left 06/05/2016  .  Pelvic floor dysfunction 04/01/2016  . GERD (gastroesophageal reflux disease) 10/13/2015  . Gastroparesis 10/13/2015  . Intractable chronic migraine without aura and without status migrainosus 12/15/2013  . Hyperlipidemia 04/04/2012  . Mitral valve disease 07/15/2009  . IRRITABLE BOWEL SYNDROME 07/15/2009  . Palpitations 06/28/2009    10:46 AM,04/10/18 Sherol Dade PT, DPT [facility] [address] [place], [name], [address] Phone: [phone]   Fax:  [phone]  Name: William [name] MRN: [id number] Date of Birth: 1961/03/24

## 2018-04-13 ENCOUNTER — Other Ambulatory Visit: Payer: Self-pay | Admitting: Family Medicine

## 2018-04-18 ENCOUNTER — Encounter: Payer: PPO | Admitting: Physical Therapy

## 2018-04-22 ENCOUNTER — Encounter: Payer: PPO | Admitting: Physical Therapy

## 2018-04-24 ENCOUNTER — Encounter: Payer: PPO | Admitting: Physical Therapy

## 2018-05-09 ENCOUNTER — Ambulatory Visit: Payer: PPO | Admitting: Family Medicine

## 2018-05-09 ENCOUNTER — Encounter

## 2018-05-13 ENCOUNTER — Ambulatory Visit: Payer: Self-pay | Admitting: Urology

## 2018-06-02 ENCOUNTER — Ambulatory Visit: Payer: PPO | Admitting: Family Medicine

## 2018-06-06 DIAGNOSIS — G43719 Chronic migraine without aura, intractable, without status migrainosus: Secondary | ICD-10-CM | POA: Diagnosis not present

## 2018-06-06 DIAGNOSIS — R809 Proteinuria, unspecified: Secondary | ICD-10-CM | POA: Diagnosis not present

## 2018-06-06 DIAGNOSIS — N182 Chronic kidney disease, stage 2 (mild): Secondary | ICD-10-CM | POA: Diagnosis not present

## 2018-06-06 DIAGNOSIS — I471 Supraventricular tachycardia: Secondary | ICD-10-CM | POA: Diagnosis not present

## 2018-06-16 ENCOUNTER — Telehealth: Payer: Self-pay | Admitting: Family Medicine

## 2018-06-16 NOTE — Telephone Encounter (Signed)
LVM for pt to call office and reschedule. Due to Dr. Brigitte Pulse being on leave, pt will need to be reschedule. When pt calls back, please reschedule appt form 06/24/18 at their convenience. Thank you!

## 2018-06-18 DIAGNOSIS — R05 Cough: Secondary | ICD-10-CM | POA: Diagnosis not present

## 2018-06-24 ENCOUNTER — Other Ambulatory Visit: Payer: Self-pay

## 2018-06-24 ENCOUNTER — Ambulatory Visit (INDEPENDENT_AMBULATORY_CARE_PROVIDER_SITE_OTHER): Payer: PPO | Admitting: Family Medicine

## 2018-06-24 ENCOUNTER — Encounter: Payer: Self-pay | Admitting: Family Medicine

## 2018-06-24 ENCOUNTER — Ambulatory Visit: Payer: PPO | Admitting: Family Medicine

## 2018-06-24 VITALS — BP 117/78 | HR 70 | Temp 98.0°F | Resp 16 | Ht 71.0 in | Wt 151.0 lb

## 2018-06-24 DIAGNOSIS — J302 Other seasonal allergic rhinitis: Secondary | ICD-10-CM | POA: Diagnosis not present

## 2018-06-24 DIAGNOSIS — R102 Pelvic and perineal pain: Secondary | ICD-10-CM

## 2018-06-24 DIAGNOSIS — Z1329 Encounter for screening for other suspected endocrine disorder: Secondary | ICD-10-CM

## 2018-06-24 DIAGNOSIS — G8929 Other chronic pain: Secondary | ICD-10-CM

## 2018-06-24 DIAGNOSIS — Z125 Encounter for screening for malignant neoplasm of prostate: Secondary | ICD-10-CM | POA: Diagnosis not present

## 2018-06-24 DIAGNOSIS — E559 Vitamin D deficiency, unspecified: Secondary | ICD-10-CM | POA: Diagnosis not present

## 2018-06-24 MED ORDER — FLUTICASONE PROPIONATE 50 MCG/ACT NA SUSP: 2.0000 | Freq: Every day | NASAL | 6 refills | Status: AC

## 2018-06-24 MED ORDER — AZELASTINE HCL 0.05 % OP SOLN: 1.0000 [drp] | Freq: Two times a day (BID) | OPHTHALMIC | 12 refills | Status: DC

## 2018-06-24 MED ORDER — CYCLOBENZAPRINE HCL 10 MG PO TABS: 10.0000 mg | ORAL_TABLET | Freq: Three times a day (TID) | ORAL | 3 refills | Status: DC | PRN

## 2018-06-24 MED ORDER — TRAMADOL HCL 50 MG PO TABS: 50.0000 mg | ORAL_TABLET | Freq: Every day | ORAL | 1 refills | Status: DC

## 2018-06-24 MED ORDER — BUTALBITAL-APAP-CAFFEINE 50-325-40 MG PO TABS: 1.0000 | ORAL_TABLET | ORAL | 1 refills | Status: DC | PRN

## 2018-06-24 MED ORDER — ACETAMINOPHEN-CODEINE #2 300-15 MG PO TABS: 1.0000 | ORAL_TABLET | ORAL | 0 refills | Status: DC | PRN

## 2018-06-24 MED ORDER — PROMETHAZINE HCL 12.5 MG PO TABS: 12.5000 mg | ORAL_TABLET | Freq: Three times a day (TID) | ORAL | 0 refills | Status: DC | PRN

## 2018-06-24 MED ORDER — LORATADINE 10 MG PO TABS: 10.0000 mg | ORAL_TABLET | Freq: Every day | ORAL | 11 refills | Status: DC

## 2018-06-24 NOTE — Patient Instructions (Addendum)
If you have lab work done today you will be contacted with your lab results within the next 2 weeks.  If you have not heard from [place] then please contact us. The fastest way to get your results is to register for [name].   IF you received an x-ray today, you will receive an invoice from [facility]. Please contact [facility] at [phone] with questions or concerns regarding your invoice.   IF you received labwork today, you will receive an invoice from [facility]. Please contact [name] at [phone] with questions or concerns regarding your invoice.   Our billing staff will not be able to assist you with questions regarding bills from these companies.  You will be contacted with the lab results as soon as they are available. The fastest way to get your results is to activate your [name] account. Instructions are located on the last page of this paperwork. If you have not heard from [place] regarding the results in 2 weeks, please contact this office.      Recurrent Migraine Headache  Migraines are a type of headache, and they are usually stronger and more sudden than normal headaches (tension headaches). Migraines are characterized by an intense pulsing, throbbing pain that is usually only present on one side of the head. Sometimes, migraine headaches can cause nausea, vomiting, sensitivity to light and sound, and vision changes. Recurrent migraines keep coming back (recurring). A migraine can last from 4 hours up to 3 days. What are the causes? The exact cause of this condition is not known. However, a migraine may be caused when nerves in the brain become irritated and release chemicals that cause inflammation of blood vessels. This inflammation causes pain. Certain things may also trigger migraines, such as:  A disruption in your regular eating and sleeping schedule.  Smoking.  Stress.  Menstruation.  Certain foods and drinks, such as: ? Aged  cheese. ? Chocolate. ? Alcohol. ? Caffeine. ? Foods or drinks that contain nitrates, glutamate, aspartame, MSG, or tyramine.  Lack of sleep.  Hunger.  Physical exertion.  Fatigue.  High altitude.  Weather changes.  Medicines, such as: ? Nitroglycerin, which is used to treat chest pain. ? Birth control pills. ? Estrogen. ? Some blood pressure medicines. What are the signs or symptoms? Symptoms of this condition vary for each person and may include:  Pain that is usually only present on one side of the head. In some cases, the pain may be on both sides of the head or around the head or neck.  Pulsating or throbbing pain.  Severe pain that prevents daily activities.  Pain that is aggravated by any physical activity.  Nausea, vomiting, or both.  Dizziness.  Pain with exposure to bright lights, loud noises, or activity.  General sensitivity to bright lights, loud noises, or smells. Before you get a migraine, you may get warning signs that a migraine is coming (aura). An aura may include:  Seeing flashing lights.  Seeing bright spots, halos, or zigzag lines.  Having tunnel vision or blurred vision.  Having numbness or a tingling feeling.  Having trouble talking.  Having muscle weakness.  Smelling a certain odor. How is this diagnosed? This condition is often diagnosed based on:  Your symptoms and medical history.  A physical exam. You may also have tests, including:  A CT scan or MRI of your brain. These imaging tests cannot diagnose migraines, but they can help to rule out other causes of  headaches.  Blood tests. How is this treated? This condition is treated with:  Medicines. These are used for: ? Lessening pain and nausea. ? Preventing recurrent migraines.  Lifestyle changes, such as changes to your diet or sleeping patterns.  Behavior therapy, such as relaxation training or biofeedback. Biofeedback is a treatment that involves teaching you to  relax and use your brain to lower your heart rate and control your breathing. Follow these instructions at home: Medicines  Take over-the-counter and prescription medicines only as told by your health care provider.  Do not drive or use heavy machinery while taking prescription pain medicine. Lifestyle  Do not use any products that contain nicotine or tobacco, such as cigarettes and e-cigarettes. If you need help quitting, ask your health care provider.  Limit alcohol intake to no more than 1 drink a day for nonpregnant women and 2 drinks a day for men. One drink equals 12 oz of beer, 5 oz of wine, or 1 oz of hard liquor.  Get 7-9 hours of sleep each night, or the amount of sleep recommended by your health care provider.  Limit your stress. Talk with your health care provider if you need help with stress management.  Maintain a healthy weight. If you need help losing weight, ask your health care provider.  Exercise regularly. Aim for 150 minutes of moderate-intensity exercise (walking, biking, yoga) or 75 minutes of vigorous exercise (running, circuit training, swimming) each week. General instructions   Keep a journal to find out what triggers your migraine headaches so you can avoid these triggers. For example, write down: ? What you eat and drink. ? How much sleep you get. ? Any change to your diet or medicines.  Lie down in a dark, quiet room when you have a migraine.  Try placing a cool towel over your head when you have a migraine.  Keep lights dim, if bright lights bother you and make your migraines worse.  Keep all follow-up visits as told by your health care provider. This is important. Contact a health care provider if:  Your pain does not improve, even with medicine.  Your migraines continue to return, even with medicine.  You have a fever.  You have weight loss. Get help right away if:  Your migraine becomes severe and medicine does not help.  You have a  stiff neck.  You have a loss of vision.  You have muscle weakness or loss of muscle control.  You start losing your balance or have trouble walking.  You feel faint or you pass out.  You develop new, severe symptoms.  You start having abrupt severe headaches that last for a second or less, like a thunderclap. Summary  Migraine headaches are usually stronger and more sudden than normal headaches (tension headaches). Migraines are characterized by an intense pulsing, throbbing pain that is usually only present on one side of the head.  The exact cause of this condition is not known. However, a migraine may be caused when nerves in the brain become irritated and release chemicals that cause inflammation of blood vessels.  Certain things may trigger migraines, such as changes to diet or sleeping patterns, smoking, certain foods, alcohol, stress, and certain medicines.  Sometimes, migraine headaches can cause nausea, vomiting, sensitivity to light and sound, and vision changes.  Migraines are often diagnosed based on your symptoms, medical history, and a physical exam. This information is not intended to replace advice given to you by your health care provider.  Make sure you discuss any questions you have with your health care provider. Document Released: 01/09/2001 Document Revised: 01/27/2016 Document Reviewed: 01/27/2016 Elsevier Interactive Patient Education  2019 [employer].

## 2018-06-24 NOTE — Progress Notes (Signed)
Subjective:    Patient: William [name]  DOB: 06/28/1960; 58 y.o.   MRN: [id number]  Chief Complaint  Patient presents with  . Back Pain    HPI Pt gave him PT Paulene Floor to work on his back to strengthen his back. But seems to be getting worse.  Has appt with Dr. Erlene Quan tomorrow. Never finished pelvic floor PT.   Has had allergies and taking a lot of benadryl which stopped the itching eyes and throat - which helped a lot but temporarily.  Wondering about eye drops or nasal spay to help.  The migraine injection was very expensive - Emg[facility].  Mother been with him for almost his a full year and doesn't see how she can go back. Causing a lot of struggles - she is treating him like a child but he has to take care of her and make the medical decisions and care.  She has been seeing Dr. Justin Mend (at [facility] who is Allison's doctor.) He is keeping his mother who is having BP spikes then req close montiring  Has seen Dr. Melford Aase prior   [facility] was $40 so he might be able to consider it in the future. He is willing to try it in the future - maybe would like to try after he re-establishes with me at [facility]. Tramadol makes him to aggressive.  Medical History Past Medical History:  Diagnosis Date  . Allergy   . Anxiety   . Depression   . Gastroparesis   . Heart murmur   . Migraine headache   . Mitral valve prolapse    History reviewed. No pertinent surgical history. Current Outpatient Medications on File Prior to Visit  Medication Sig Dispense Refill  . butalbital-acetaminophen-caffeine (FIORICET, ESGIC) 50-325-40 MG tablet Take 1 tablet by mouth every 4 (four) hours as needed for headache. 60 tablet 1  . cyclobenzaprine (FLEXERIL) 10 MG tablet Take 1 tablet (10 mg total) by mouth 3 (three) times daily as needed for muscle spasms. 90 tablet 3  . diltiazem (CARDIZEM CD) 120 MG 24 hr capsule Take 1 capsule (120 mg total) by mouth daily. 30 capsule 11  . diltiazem (CARDIZEM) 30 MG  tablet TAKE (1) TABLET AS NEEDED FOR PALPITATIONS 30 tablet 5  . fluticasone (FLONASE) 50 MCG/ACT nasal spray Place 2 sprays daily into both nostrils. 16 g 6  . Galcanezumab-gnlm (EMGALITY) 120 MG/ML SOAJ Inject 240 mg into the skin every 30 (thirty) days for 29 days, THEN 120 mg every 30 (thirty) days. 4 pen 11  . methocarbamol (ROBAXIN) 500 MG tablet Take 1 tablet (500 mg total) by mouth 4 (four) times daily. For muscle spasm 60 tablet 1  . ondansetron (ZOFRAN) 4 MG tablet Take by mouth.    . promethazine (PHENERGAN) 12.5 MG tablet Take 1 tablet (12.5 mg total) by mouth every 8 (eight) hours as needed (migraine headache). 20 tablet 0  . traMADol (ULTRAM) 50 MG tablet TAKE 1 TABLET EVERY 8 HOURS AS NEEDED. 21 tablet 0  . Vitamin D, Ergocalciferol, (DRISDOL) 50000 units CAPS capsule Take 1 capsule (50,000 Units total) by mouth every 7 (seven) days. 12 capsule 1   No current facility-administered medications on file prior to visit.    Allergies  Allergen Reactions  . Erythromycin Anaphylaxis and Nausea And Vomiting  . Nalbuphine Other (See Comments)    States loss of consciousness   . Cefdinir Diarrhea, Nausea And Vomiting and Rash  . Cephalexin Diarrhea and Nausea And Vomiting  . Cephalosporins  Diarrhea and Nausea And Vomiting  . Hydrocodone-Acetaminophen Nausea And Vomiting  . Ketorolac Nausea And Vomiting  . Prochlorperazine Edisylate Other (See Comments)    uncontrollable shake - tremor   . Restasis [Cyclosporine] Other (See Comments)    migraines  . Tyloxapol Swelling  . Propranolol Diarrhea  . Topamax [Topiramate] Diarrhea and Nausea And Vomiting  . Zetia [Ezetimibe] Diarrhea  . Doxycycline Hyclate Nausea Only and Other (See Comments)    abd pain  . Iodinated Diagnostic Agents Other (See Comments)    Lightheaded, flushed feeling (explained to patient these are normal side effects of IV iodinated contrast, not allergies, but he wanted this noted in the epic; he tolerated  epidural steroid injections w/ contrast w/o difficulty)  . Ketoconazole Itching  . Latex Itching  . Neosporin [Neomycin-Bacitracin Zn-Polymyx] Rash  . Oxycodone-Acetaminophen Other (See Comments)    Tylox caused constipation  . Prednisone Nausea And Vomiting    Oral route, only   Family History  Problem Relation Age of Onset  . Hypertension Mother   . Hypertension Brother    Social History   Socioeconomic History  . Marital status: Married    Spouse name: Not on file  . Number of children: Not on file  . Years of education: Not on file  . Highest education level: Not on file  Occupational History  . Not on file  Social Needs  . Financial resource strain: Not on file  . Food insecurity:    Worry: Not on file    Inability: Not on file  . Transportation needs:    Medical: Not on file    Non-medical: Not on file  Tobacco Use  . Smoking status: Never Smoker  . Smokeless tobacco: Never Used  Substance and Sexual Activity  . Alcohol use: No    Alcohol/week: 0.0 standard drinks  . Drug use: No  . Sexual activity: Yes    Partners: Female  Lifestyle  . Physical activity:    Days per week: Not on file    Minutes per session: Not on file  . Stress: Not on file  Relationships  . Social connections:    Talks on phone: Not on file    Gets together: Not on file    Attends religious service: Not on file    Active member of club or organization: Not on file    Attends meetings of clubs or organizations: Not on file    Relationship status: Not on file  Other Topics Concern  . Not on file  Social History Narrative  . Not on file   Depression screen [facility] 2/9 06/24/2018 03/13/2018 12/05/2017 11/20/2017 11/01/2017  Decreased Interest 0 0 0 0 0  Down, Depressed, Hopeless 0 0 0 0 0  PHQ - 2 Score 0 0 0 0 0  Altered sleeping - - - - -  Tired, decreased energy - - - - -  Change in appetite - - - - -  Feeling bad or failure about yourself  - - - - -  Trouble concentrating - - - - -    Moving slowly or fidgety/restless - - - - -  Suicidal thoughts - - - - -  PHQ-9 Score - - - - -  Some recent data might be hidden    ROS As noted in HPI  Objective:  BP 117/78   Pulse 70   Temp 98 F (36.7 C) (Oral)   Resp 16   Ht 5\' 11"  (1.803 m)  Wt 151 lb (68.5 kg)   SpO2 98%   BMI 21.06 kg/m  Physical Exam Constitutional:      General: He is not in acute distress.    Appearance: He is well-developed. He is not diaphoretic.  HENT:     Head: Normocephalic and atraumatic.  Eyes:     General: No scleral icterus. Pulmonary:     Effort: Pulmonary effort is normal.  Skin:    General: Skin is warm and dry.  Neurological:     Mental Status: He is alert and oriented to person, place, and time.  Psychiatric:        Behavior: Behavior normal.     [facility] Office Visit on 06/24/2018  Component Date Value Ref Range Status  . Vit D, 25-Hydroxy 06/24/2018 13.6* 30.0 - 100.0 ng/mL Final   Comment: Vitamin D deficiency has been defined by the [place] practice guideline as a level of serum 25-OH vitamin D less than 20 ng/mL (1,2). The Endocrine Society went on to further define vitamin D insufficiency as a level between 21 and 29 ng/mL (2). 1. IOM (Institute of Medicine). 2010. Dietary reference    intakes for calcium and D. [place]: The    [employer]. 2. Holick MF, Binkley , Bischoff-Ferrari HA, et al.    Evaluation, treatment, and prevention of vitamin D    deficiency: an Endocrine Society clinical practice    guideline. JCEM. 2011 Jul; 96(7):1911-30.   [name] Glucose 06/24/2018 82  65 - 99 mg/dL Final  . BUN 06/24/2018 15  6 - 24 mg/dL Final  . Creatinine, Ser 06/24/2018 1.15  0.76 - 1.27 mg/dL Final  . GFR calc non Af Amer 06/24/2018 70  >59 mL/min/1.73 Final  . GFR calc Af Amer 06/24/2018 81  >59 mL/min/1.73 Final  . BUN/Creatinine Ratio 06/24/2018 13  9 - 20 Final  . Sodium 06/24/2018 138  134 - 144  mmol/L Final  . Potassium 06/24/2018 4.4  3.5 - 5.2 mmol/L Final  . Chloride 06/24/2018 103  96 - 106 mmol/L Final  . CO2 06/24/2018 21  20 - 29 mmol/L Final  . Calcium 06/24/2018 9.9  8.7 - 10.2 mg/dL Final  . Total Protein 06/24/2018 7.1  6.0 - 8.5 g/dL Final  . Albumin 06/24/2018 4.6  3.8 - 4.9 g/dL Final                 **Please note reference interval change**  . Globulin, Total 06/24/2018 2.5  1.5 - 4.5 g/dL Final  . Albumin/Globulin Ratio 06/24/2018 1.8  1.2 - 2.2 Final  . Bilirubin Total 06/24/2018 1.0  0.0 - 1.2 mg/dL Final  . Alkaline Phosphatase 06/24/2018 89  39 - 117 IU/L Final  . AST 06/24/2018 20  0 - 40 IU/L Final  . ALT 06/24/2018 20  0 - 44 IU/L Final  . Prostate Specific Ag, Serum 06/24/2018 1.6  0.0 - 4.0 ng/mL Final   Comment: Roche ECLIA methodology. According to the American Urological Association, Serum PSA should decrease and remain at undetectable levels after radical prostatectomy. The AUA defines biochemical recurrence as an initial PSA value 0.2 ng/mL or greater followed by a subsequent confirmatory PSA value 0.2 ng/mL or greater. Values obtained with different assay methods or kits cannot be used interchangeably. Results cannot be interpreted as absolute evidence of the presence or absence of malignant disease.   [name] TSH 06/24/2018 0.712  0.450 - 4.500 uIU/mL Final  . WBC  06/24/2018 7.1  3.4 - 10.8 x10E3/uL Final  . RBC 06/24/2018 4.93  4.14 - 5.80 x10E6/uL Final  . Hemoglobin 06/24/2018 15.6  13.0 - 17.7 g/dL Final  . Hematocrit 06/24/2018 45.3  37.5 - 51.0 % Final  . MCV 06/24/2018 92  79 - 97 fL Final  . MCH 06/24/2018 31.6  26.6 - 33.0 pg Final  . MCHC 06/24/2018 34.4  31.5 - 35.7 g/dL Final  . RDW 06/24/2018 11.3* 11.6 - 15.4 % Final  . Platelets 06/24/2018 293  150 - 450 x10E3/uL Final  . Neutrophils 06/24/2018 65  Not Estab. % Final  . Lymphs 06/24/2018 26  Not Estab. % Final  . Monocytes 06/24/2018 7  Not Estab. % Final  . Eos 06/24/2018 1   Not Estab. % Final  . Basos 06/24/2018 1  Not Estab. % Final  . Neutrophils Absolute 06/24/2018 4.6  1.4 - 7.0 x10E3/uL Final  . Lymphocytes Absolute 06/24/2018 1.8  0.7 - 3.1 x10E3/uL Final  . Monocytes Absolute 06/24/2018 0.5  0.1 - 0.9 x10E3/uL Final  . EOS (ABSOLUTE) 06/24/2018 0.1  0.0 - 0.4 x10E3/uL Final  . Basophils Absolute 06/24/2018 0.0  0.0 - 0.2 x10E3/uL Final  . Immature Granulocytes 06/24/2018 0  Not Estab. % Final  . Immature Grans (Abs) 06/24/2018 0.0  0.0 - 0.1 x10E3/uL Final     Assessment & Plan:   1. Vitamin D deficiency   2. Chronic pelvic pain in male   3. Screening for thyroid disorder   4. Screening for prostate cancer   5. Seasonal allergies     above  Patient will continue on current chronic medications other than changes noted above, so ok to refill when needed.   See after visit summary for patient specific instructions.  Orders Placed This Encounter  Procedures  . VITAMIN D 25 Hydroxy (Vit-D Deficiency, Fractures)  . Comprehensive metabolic panel  . PSA  . TSH  . CBC with Differential/Platelet    Meds ordered this encounter  Medications  . acetaminophen-codeine (TYLENOL #2) 300-15 MG tablet    Sig: Take 1 tablet by mouth every 4 (four) hours as needed for moderate pain (headaches).    Dispense:  30 tablet    Refill:  0  . cyclobenzaprine (FLEXERIL) 10 MG tablet    Sig: Take 1 tablet (10 mg total) by mouth 3 (three) times daily as needed for muscle spasms.    Dispense:  90 tablet    Refill:  3  . butalbital-acetaminophen-caffeine (FIORICET, ESGIC) 50-325-40 MG tablet    Sig: Take 1 tablet by mouth every 4 (four) hours as needed for headache.    Dispense:  60 tablet    Refill:  1  . fluticasone (FLONASE) 50 MCG/ACT nasal spray    Sig: Place 2 sprays into both nostrils daily.    Dispense:  16 g    Refill:  6  . promethazine (PHENERGAN) 12.5 MG tablet    Sig: Take 1 tablet (12.5 mg total) by mouth every 8 (eight) hours as needed  (migraine headache).    Dispense:  20 tablet    Refill:  0  . traMADol (ULTRAM) 50 MG tablet    Sig: Take 1 tablet (50 mg total) by mouth at bedtime.    Dispense:  30 tablet    Refill:  1  . loratadine (CLARITIN) 10 MG tablet    Sig: Take 1 tablet (10 mg total) by mouth daily.    Dispense:  30 tablet  Refill:  11  . azelastine (OPTIVAR) 0.05 % ophthalmic solution    Sig: Place 1 drop into both eyes 2 (two) times daily.    Dispense:  6 mL    Refill:  12    Patient verbalized to me that they understand the following: diagnosis, what is being done for them, what to expect and what should be done at home.  Their questions have been answered. They understand that I am unable to predict every possible medication interaction or adverse outcome and that if any unexpected symptoms arise, they should contact us and their pharmacist, as well as never hesitate to seek urgent/emergent care at [facility] or ER if they think it might be warranted.    Over 40 min spent in face-to-face evaluation of and consultation with patient and coordination of care.  Over 50% of this time was spent counseling this patient regarding above.   [name], MD, MPH Primary [facility] at [facility] [place], [place]  [address] [phone] Office phone  [phone] Office fax  06/24/18 11:01 AM

## 2018-06-25 ENCOUNTER — Ambulatory Visit: Payer: PPO | Admitting: Urology

## 2018-06-25 ENCOUNTER — Encounter: Payer: Self-pay | Admitting: Urology

## 2018-06-25 VITALS — BP 131/84 | HR 85 | Ht 70.0 in | Wt 156.0 lb

## 2018-06-25 DIAGNOSIS — R1032 Left lower quadrant pain: Secondary | ICD-10-CM | POA: Diagnosis not present

## 2018-06-25 DIAGNOSIS — R102 Pelvic and perineal pain: Secondary | ICD-10-CM

## 2018-06-25 DIAGNOSIS — N50819 Testicular pain, unspecified: Secondary | ICD-10-CM | POA: Diagnosis not present

## 2018-06-25 LAB — VITAMIN D 25 HYDROXY (VIT D DEFICIENCY, FRACTURES): VIT D 25 HYDROXY: 13.6 ng/mL — AB (ref 30.0–100.0)

## 2018-06-25 LAB — COMPREHENSIVE METABOLIC PANEL
A/G RATIO: 1.8 (ref 1.2–2.2)
ALT: 20 IU/L (ref 0–44)
AST: 20 IU/L (ref 0–40)
Albumin: 4.6 g/dL (ref 3.8–4.9)
Alkaline Phosphatase: 89 IU/L (ref 39–117)
BUN/Creatinine Ratio: 13 (ref 9–20)
BUN: 15 mg/dL (ref 6–24)
Bilirubin Total: 1 mg/dL (ref 0.0–1.2)
CALCIUM: 9.9 mg/dL (ref 8.7–10.2)
CHLORIDE: 103 mmol/L (ref 96–106)
CO2: 21 mmol/L (ref 20–29)
Creatinine, Ser: 1.15 mg/dL (ref 0.76–1.27)
GFR calc Af Amer: 81 mL/min/{1.73_m2} (ref >59)
GFR, EST NON AFRICAN AMERICAN: 70 mL/min/{1.73_m2} (ref >59)
GLUCOSE: 82 mg/dL (ref 65–99)
Globulin, Total: 2.5 g/dL (ref 1.5–4.5)
POTASSIUM: 4.4 mmol/L (ref 3.5–5.2)
Sodium: 138 mmol/L (ref 134–144)
Total Protein: 7.1 g/dL (ref 6.0–8.5)

## 2018-06-25 LAB — MICROSCOPIC EXAMINATION
Bacteria, UA: NONE SEEN
Epithelial Cells (non renal): NONE SEEN /HPF (ref 0–10)
WBC, UA: NONE SEEN /HPF (ref 0–5)

## 2018-06-25 LAB — URINALYSIS, COMPLETE
Bilirubin, UA: NEGATIVE
Glucose, UA: NEGATIVE
Ketones, UA: NEGATIVE
Leukocytes, UA: NEGATIVE
Nitrite, UA: NEGATIVE
Specific Gravity, UA: 1.02 (ref 1.005–1.030)
Urobilinogen, Ur: 1 mg/dL (ref 0.2–1.0)
pH, UA: 7 (ref 5.0–7.5)

## 2018-06-25 LAB — CBC WITH DIFFERENTIAL/PLATELET
BASOS ABS: 0 10*3/uL (ref 0.0–0.2)
Basos: 1 %
EOS (ABSOLUTE): 0.1 10*3/uL (ref 0.0–0.4)
Eos: 1 %
Hematocrit: 45.3 % (ref 37.5–51.0)
Hemoglobin: 15.6 g/dL (ref 13.0–17.7)
Immature Grans (Abs): 0 10*3/uL (ref 0.0–0.1)
Immature Granulocytes: 0 %
LYMPHS ABS: 1.8 10*3/uL (ref 0.7–3.1)
Lymphs: 26 %
MCH: 31.6 pg (ref 26.6–33.0)
MCHC: 34.4 g/dL (ref 31.5–35.7)
MCV: 92 fL (ref 79–97)
MONOCYTES: 7 %
MONOS ABS: 0.5 10*3/uL (ref 0.1–0.9)
Neutrophils Absolute: 4.6 10*3/uL (ref 1.4–7.0)
Neutrophils: 65 %
PLATELETS: 293 10*3/uL (ref 150–450)
RBC: 4.93 x10E6/uL (ref 4.14–5.80)
RDW: 11.3 % — AB (ref 11.6–15.4)
WBC: 7.1 10*3/uL (ref 3.4–10.8)

## 2018-06-25 LAB — TSH: TSH: 0.712 u[IU]/mL (ref 0.450–4.500)

## 2018-06-25 LAB — PSA: Prostate Specific Ag, Serum: 1.6 ng/mL (ref 0.0–4.0)

## 2018-06-25 NOTE — Progress Notes (Addendum)
06/25/2018 11:29 AM   William [name] 02-Jan-1961 [id number]  Referring provider: Shawnee Knapp, MD [address] [place], [place] [address]  Chief Complaint  Patient presents with  . Testicle Pain    New Patient    HPI: William [name] is a 58 yo M who presents today for the evaluation and management of chronic left testicular pain (fx3-4 years), left flank pain, chronic pelvic pain, abdominal pain (LLQ), and scrotal pain.   He has been seen and evaluated by many providers over the past several years for this including urologist at [facility], [facility] (Dr. Diona Fanti) all of whom have found no pathology other than concern for possible pelvic floor dysfunction.  He reports that his pelvic pain wraps around and describes it as a dull aching pain.  It seems to be exacerbated with physical activity.  Improves with rest.  It tracks down into his left hemiscrotum but seems to originate in his left inguinal area.  He is been checked for hernias as well as CT scan all of which are negative.  He was referred to pelvic floor therapy about a year ago and was progressing nicely with improvement of his chronic pelvic/inguinal/scrotal pain.  He injured himself while carrying four water boxes which hurt his back last summer. He got a steroid injection for this pain.  In light of his back pain, he transition from a physical therapist who specialized in pelvic floor therapy to mine who specializes in back pain.  He reports that his pain worsened with PT exercises such as planks and stretches.    He reports of a slow stream and frequency which are not bothersome. Nocturia at most 2x .  No dysuria or gross hematuria.  His most recent PSA is 1.6 from 06/24/2018.  His UA today is negative.   PMH: Past Medical History:  Diagnosis Date  . Allergy   . Anxiety   . Depression   . Gastroparesis   . Heart murmur   . Migraine headache   . Mitral valve prolapse     Surgical History: No  past surgical history on file.  Home Medications:  Allergies as of 06/25/2018      Reactions   Erythromycin Anaphylaxis, Nausea And Vomiting   Nalbuphine Other (See Comments)   States loss of consciousness   Cefdinir Diarrhea, Nausea And Vomiting, Rash   Cephalexin Diarrhea, Nausea And Vomiting   Cephalosporins Diarrhea, Nausea And Vomiting   Hydrocodone-acetaminophen Nausea And Vomiting   Ketorolac Nausea And Vomiting   Prochlorperazine Edisylate Other (See Comments)   uncontrollable shake - tremor   Restasis [cyclosporine] Other (See Comments)   migraines   Tyloxapol Swelling   Propranolol Diarrhea   Topamax [topiramate] Diarrhea, Nausea And Vomiting   Zetia [ezetimibe] Diarrhea   Doxycycline Hyclate Nausea Only, Other (See Comments)   abd pain   Iodinated Diagnostic Agents Other (See Comments)   Lightheaded, flushed feeling (explained to patient these are normal side effects of IV iodinated contrast, not allergies, but he wanted this noted in the epic; he tolerated epidural steroid injections w/ contrast w/o difficulty)   Ketoconazole Itching   Latex Itching   Neosporin [neomycin-bacitracin Zn-polymyx] Rash   Oxycodone-acetaminophen Other (See Comments)   Tylox caused constipation   Prednisone Nausea And Vomiting   Oral route, only      Medication List       Accurate as of June 25, 2018 11:29 AM. Always use your most recent med list.  acetaminophen-codeine 300-15 MG tablet Commonly known as:  TYLENOL #2 Take 1 tablet by mouth every 4 (four) hours as needed for moderate pain (headaches).   azelastine 0.05 % ophthalmic solution Commonly known as:  OPTIVAR Place 1 drop into both eyes 2 (two) times daily.   butalbital-acetaminophen-caffeine 50-325-40 MG tablet Commonly known as:  FIORICET, ESGIC Take 1 tablet by mouth every 4 (four) hours as needed for headache.   cyclobenzaprine 10 MG tablet Commonly known as:  FLEXERIL Take 1 tablet (10 mg total) by  mouth 3 (three) times daily as needed for muscle spasms.   diltiazem 30 MG tablet Commonly known as:  CARDIZEM TAKE (1) TABLET AS NEEDED FOR PALPITATIONS   fluticasone 50 MCG/ACT nasal spray Commonly known as:  FLONASE Place 2 sprays into both nostrils daily.   loratadine 10 MG tablet Commonly known as:  CLARITIN Take 1 tablet (10 mg total) by mouth daily.   methocarbamol 500 MG tablet Commonly known as:  ROBAXIN Take 1 tablet (500 mg total) by mouth 4 (four) times daily. For muscle spasm   ondansetron 4 MG tablet Commonly known as:  ZOFRAN Take by mouth.   promethazine 12.5 MG tablet Commonly known as:  PHENERGAN Take 1 tablet (12.5 mg total) by mouth every 8 (eight) hours as needed (migraine headache).   traMADol 50 MG tablet Commonly known as:  ULTRAM Take 1 tablet (50 mg total) by mouth at bedtime.   Vitamin D (Ergocalciferol) 1.25 MG (50000 UT) Caps capsule Commonly known as:  DRISDOL Take 1 capsule (50,000 Units total) by mouth every 7 (seven) days.       Allergies:  Allergies  Allergen Reactions  . Erythromycin Anaphylaxis and Nausea And Vomiting  . Nalbuphine Other (See Comments)    States loss of consciousness   . Cefdinir Diarrhea, Nausea And Vomiting and Rash  . Cephalexin Diarrhea and Nausea And Vomiting  . Cephalosporins Diarrhea and Nausea And Vomiting  . Hydrocodone-Acetaminophen Nausea And Vomiting  . Ketorolac Nausea And Vomiting  . Prochlorperazine Edisylate Other (See Comments)    uncontrollable shake - tremor   . Restasis [Cyclosporine] Other (See Comments)    migraines  . Tyloxapol Swelling  . Propranolol Diarrhea  . Topamax [Topiramate] Diarrhea and Nausea And Vomiting  . Zetia [Ezetimibe] Diarrhea  . Doxycycline Hyclate Nausea Only and Other (See Comments)    abd pain  . Iodinated Diagnostic Agents Other (See Comments)    Lightheaded, flushed feeling (explained to patient these are normal side effects of IV iodinated contrast, not  allergies, but he wanted this noted in the epic; he tolerated epidural steroid injections w/ contrast w/o difficulty)  . Ketoconazole Itching  . Latex Itching  . Neosporin [Neomycin-Bacitracin Zn-Polymyx] Rash  . Oxycodone-Acetaminophen Other (See Comments)    Tylox caused constipation  . Prednisone Nausea And Vomiting    Oral route, only    Family History: Family History  Problem Relation Age of Onset  . Hypertension Mother   . Hypertension Brother   . Kidney failure Maternal Grandmother   . Prostate cancer Neg Hx   . Kidney cancer Neg Hx     Social History:  reports that he has never smoked. He has never used smokeless tobacco. He reports that he does not drink alcohol or use drugs.  ROS: UROLOGY Frequent Urination?: Yes Hard to postpone urination?: No Burning/pain with urination?: No Get up at night to urinate?: Yes Leakage of urine?: No Urine stream starts and stops?: Yes Trouble starting stream?:  Yes Do you have to strain to urinate?: Yes Blood in urine?: No Urinary tract infection?: No Sexually transmitted disease?: No Injury to kidneys or bladder?: No Painful intercourse?: No Weak stream?: Yes Erection problems?: Yes Penile pain?: No  Gastrointestinal Nausea?: No Vomiting?: No Indigestion/heartburn?: No Diarrhea?: Yes Constipation?: No  Constitutional Fever: No Night sweats?: Yes Weight loss?: No Fatigue?: Yes  Skin Skin rash/lesions?: No Itching?: No  Eyes Blurred vision?: No Double vision?: No  Ears/Nose/Throat Sore throat?: No Sinus problems?: Yes  Hematologic/Lymphatic Swollen glands?: No Easy bruising?: No  Cardiovascular Leg swelling?: No Chest pain?: No  Respiratory Cough?: Yes Shortness of breath?: Yes  Endocrine Excessive thirst?: Yes  Musculoskeletal Back pain?: Yes Joint pain?: No  Neurological Headaches?: Yes Dizziness?: No  Psychologic Depression?: No Anxiety?: No  Physical Exam: BP 131/84   Pulse 85    Ht 5\' 10"  (1.778 m)   Wt 156 lb (70.8 kg)   BMI 22.38 kg/m   Constitutional:  Well nourished. Alert and oriented, No acute distress. HEENT: [place] AT, moist mucus membranes.  Trachea midline, no masses. Cardiovascular: No clubbing, cyanosis, or edema. Respiratory: Normal respiratory effort, no increased work of breathing. GI: Abdomen is soft, non tender, non distended, no abdominal masses.  GU:  Urethral meatus is patent.  No penile discharge. No penile lesions or rashes. Scrotum without lesions, cysts, rashes and/or edema.  Testicles are located scrotally bilaterally. No masses are appreciated in the testicles. Left and right epididymis are normal.  He does have tenderness over his left external ring and along his cord without obvious bulging, masses, or hernias. Skin: No rashes, bruises or suspicious lesions. Lymph: No cervical or inguinal adenopathy.Left inguinal tenderness as above Neurologic: Grossly intact, no focal deficits, moving all 4 extremities. Psychiatric: Normal mood and affect.   Urinalysis Results for orders placed or performed in visit on 06/25/18  Microscopic Examination  Result Value Ref Range   WBC, UA None seen 0 - 5 /hpf   RBC, UA 0-2 0 - 2 /hpf   Epithelial Cells (non renal) None seen 0 - 10 /hpf   Bacteria, UA None seen None seen/Few  Urinalysis, Complete  Result Value Ref Range   Specific Gravity, UA 1.020 1.005 - 1.030   pH, UA 7.0 5.0 - 7.5   Color, UA Yellow Yellow   Appearance Ur Clear Clear   Leukocytes, UA Negative Negative   Protein, UA Trace (A) Negative/Trace   Glucose, UA Negative Negative   Ketones, UA Negative Negative   RBC, UA Trace (A) Negative   Bilirubin, UA Negative Negative   Urobilinogen, Ur 1.0 0.2 - 1.0 mg/dL   Nitrite, UA Negative Negative   Microscopic Examination See below:     Imaging: CT abdomen pelvis from 2018 was personally reviewed at which time the patient was having chronic pain.  Specific attention to the inguinal area  was appreciated, no evidence of inguinal hernias bilaterally and normal left cord.  Assessment & Plan:    1.  Chronic left testicular/inguinal/pelvic pain   Request records from Dr. Diona Fanti UA today negative, no significant concern for bladder pathology or GU issues based on history and physical exam today Recommend return to specifically pelvic floor therapy as this has been helpful in the past, referral sent to PT for pelvic floor  Return in about 3 months for recheck of urinary symptoms, consider cord block   Return in about 3 months (around 09/23/2018) for recheck urinary symptoms, consider cord block.  [employer]  [place] [address] [place], [place] [address] [phone]  I, Lucas Mallow, am acting as a scribe for Dr. Hollice Espy,  I spent 45 min with this patient of which greater than 50% was spent in counseling and coordination of care with the patient.

## 2018-06-26 ENCOUNTER — Encounter: Payer: Self-pay | Admitting: Family Medicine

## 2018-06-30 ENCOUNTER — Telehealth: Payer: Self-pay | Admitting: Family Medicine

## 2018-06-30 NOTE — Telephone Encounter (Signed)
mychart message sent to pt about their appointment with Dr Shaw °

## 2018-07-01 ENCOUNTER — Telehealth: Payer: Self-pay | Admitting: Urology

## 2018-07-01 DIAGNOSIS — N50819 Testicular pain, unspecified: Secondary | ICD-10-CM

## 2018-07-01 NOTE — Telephone Encounter (Signed)
Pt would like to go to PT in [place], since he lives there, not [facility].  He would like to go to [facility] [phone] FAX# [phone]  Therapist is Earlie Counts

## 2018-07-07 NOTE — Telephone Encounter (Signed)
New referral has been sent

## 2018-07-16 ENCOUNTER — Ambulatory Visit: Payer: PPO | Admitting: Family Medicine

## 2018-07-17 ENCOUNTER — Ambulatory Visit: Payer: PPO | Admitting: Physical Therapy

## 2018-07-24 ENCOUNTER — Other Ambulatory Visit: Payer: Self-pay

## 2018-07-24 ENCOUNTER — Telehealth (INDEPENDENT_AMBULATORY_CARE_PROVIDER_SITE_OTHER): Payer: PPO | Admitting: Family Medicine

## 2018-07-24 DIAGNOSIS — G43909 Migraine, unspecified, not intractable, without status migrainosus: Secondary | ICD-10-CM | POA: Diagnosis not present

## 2018-07-24 DIAGNOSIS — E559 Vitamin D deficiency, unspecified: Secondary | ICD-10-CM

## 2018-07-24 NOTE — Progress Notes (Signed)
Virtual Visit via Telephone Note  I connected with William [name] on 07/24/18 at 10:40 AM  by telephone and verified that I am speaking with the correct person using two identifiers.   I discussed the limitations, risks, security and privacy concerns of performing an evaluation and management service by telephone and the availability of in person appointments. I also discussed with the patient that there may be a patient responsible charge related to this service. The patient expressed understanding and agreed to proceed.  CC: Migraine HA. Chart review 5 mins prior to call.  Prior PCP: Dr. Brigitte Pulse.   History of Present Illness:  Migraine Headaches: See prior notes, has discussed with primary care provider Dr. Brigitte Pulse most recently 1 month ago.  Emgality reportedly was expensive at prior time, had discussed possibly restarting that once he followed up with primary care provider at other office.  He was continued on Phenergan, Fioricet, Flexeril, Tylenol No. 2, tramadol at bedtime. Needs phenergan refilled.  Has refills of other meds.  Currently taking above meds for his headaches. Those cause drowsiness, and affect his activities needed. Can afford that med now, and would like to try Emgality to see if that lessens need.  Migraine HA 4 times per week, not N/V. Typical migraine HA. No recent HA specialist, Dr. Tomi Likens years ago.   Vitamin D deficiency: Level 19.7 a year ago, down to 13.6 last month. Was not taking 50,000until supplement. Now on that supplement once per week. Plan to recheck in 2-3 months.     Patient Active Problem List   Diagnosis Date Noted  . Left lumbar radiculopathy 10/10/2017  . Migraine without aura and without status migrainosus, not intractable 06/22/2017  . Vitamin D deficiency 05/28/2017  . Scrotal pain 05/28/2017  . Paroxysmal SVT (supraventricular tachycardia) ([place]) 05/28/2017  . Mild left inguinal hernia 11/23/2016  . Chronic pelvic pain in male  09/28/2016  . Abdominal pain, left lower quadrant 09/28/2016  . Isolated proteinuria without specific morphologic lesion 06/27/2016  . Left testicular pain 06/27/2016  . Acute left flank pain 06/27/2016  . Epididymitis, left 06/05/2016  . Pelvic floor dysfunction 04/01/2016  . GERD (gastroesophageal reflux disease) 10/13/2015  . Gastroparesis 10/13/2015  . Intractable chronic migraine without aura and without status migrainosus 12/15/2013  . Hyperlipidemia 04/04/2012  . Mitral valve disease 07/15/2009  . IRRITABLE BOWEL SYNDROME 07/15/2009  . Palpitations 06/28/2009   Past Medical History:  Diagnosis Date  . Allergy   . Anxiety   . Depression   . Gastroparesis   . Heart murmur   . Migraine headache   . Mitral valve prolapse    No past surgical history on file. Allergies  Allergen Reactions  . Erythromycin Anaphylaxis and Nausea And Vomiting  . Nalbuphine Other (See Comments)    States loss of consciousness   . Cefdinir Diarrhea, Nausea And Vomiting and Rash  . Cephalexin Diarrhea and Nausea And Vomiting  . Cephalosporins Diarrhea and Nausea And Vomiting  . Hydrocodone-Acetaminophen Nausea And Vomiting  . Ketorolac Nausea And Vomiting  . Prochlorperazine Edisylate Other (See Comments)    uncontrollable shake - tremor   . Restasis [Cyclosporine] Other (See Comments)    migraines  . Tyloxapol Swelling  . Propranolol Diarrhea  . Topamax [Topiramate] Diarrhea and Nausea And Vomiting  . Zetia [Ezetimibe] Diarrhea  . Doxycycline Hyclate Nausea Only and Other (See Comments)    abd pain  . Iodinated Diagnostic Agents Other (See Comments)    Lightheaded, flushed feeling (explained  to patient these are normal side effects of IV iodinated contrast, not allergies, but he wanted this noted in the epic; he tolerated epidural steroid injections w/ contrast w/o difficulty)  . Ketoconazole Itching  . Latex Itching  . Neosporin [Neomycin-Bacitracin Zn-Polymyx] Rash  .  Oxycodone-Acetaminophen Other (See Comments)    Tylox caused constipation  . Prednisone Nausea And Vomiting    Oral route, only   Prior to Admission medications   Medication Sig Start Date End Date Taking? Authorizing Provider  azelastine (OPTIVAR) 0.05 % ophthalmic solution Place 1 drop into both eyes 2 (two) times daily. 06/24/18  Yes Shawnee Knapp, MD  butalbital-acetaminophen-caffeine (FIORICET, ESGIC) [phone] MG tablet Take 1 tablet by mouth every 4 (four) hours as needed for headache. 06/24/18  Yes Shawnee Knapp, MD  cyclobenzaprine (FLEXERIL) 10 MG tablet Take 1 tablet (10 mg total) by mouth 3 (three) times daily as needed for muscle spasms. 06/24/18  Yes Shawnee Knapp, MD  diltiazem (CARDIZEM) 30 MG tablet TAKE (1) TABLET AS NEEDED FOR PALPITATIONS 04/04/18  Yes Imogene Burn, PA-C  fluticasone (FLONASE) 50 MCG/ACT nasal spray Place 2 sprays into both nostrils daily. 06/24/18  Yes Shawnee Knapp, MD  loratadine (CLARITIN) 10 MG tablet Take 1 tablet (10 mg total) by mouth daily. 06/24/18  Yes Shawnee Knapp, MD  methocarbamol (ROBAXIN) 500 MG tablet Take 1 tablet (500 mg total) by mouth 4 (four) times daily. For muscle spasm 09/19/17  Yes Shawnee Knapp, MD  ondansetron [facility]) 4 MG tablet Take by mouth.   Yes [provider]  promethazine (PHENERGAN) 12.5 MG tablet Take 1 tablet (12.5 mg total) by mouth every 8 (eight) hours as needed (migraine headache). 06/24/18  Yes Shawnee Knapp, MD  traMADol (ULTRAM) 50 MG tablet Take 1 tablet (50 mg total) by mouth at bedtime. 06/24/18  Yes Shawnee Knapp, MD  Vitamin D, Ergocalciferol, (DRISDOL) 50000 units CAPS capsule Take 1 capsule (50,000 Units total) by mouth every 7 (seven) days. 11/20/17  Yes Shawnee Knapp, MD  acetaminophen-codeine (TYLENOL #2) 300-15 MG tablet Take 1 tablet by mouth every 4 (four) hours as needed for moderate pain (headaches). Patient not taking: Reported on 07/24/2018 06/24/18   Shawnee Knapp, MD   Social History   Socioeconomic History   . Marital status: Married    Spouse name: Not on file  . Number of children: Not on file  . Years of education: Not on file  . Highest education level: Not on file  Occupational History  . Not on file  Social Needs  . Financial resource strain: Not on file  . Food insecurity:    Worry: Not on file    Inability: Not on file  . Transportation needs:    Medical: Not on file    Non-medical: Not on file  Tobacco Use  . Smoking status: Never Smoker  . Smokeless tobacco: Never Used  Substance and Sexual Activity  . Alcohol use: No    Alcohol/week: 0.0 standard drinks  . Drug use: No  . Sexual activity: Yes    Partners: Female  Lifestyle  . Physical activity:    Days per week: Not on file    Minutes per session: Not on file  . Stress: Not on file  Relationships  . Social connections:    Talks on phone: Not on file    Gets together: Not on file    Attends religious service: Not on file  Active member of club or organization: Not on file    Attends meetings of clubs or organizations: Not on file    Relationship status: Not on file  . Intimate partner violence:    Fear of current or ex partner: Not on file    Emotionally abused: Not on file    Physically abused: Not on file    Forced sexual activity: Not on file  Other Topics Concern  . Not on file  Social History Narrative  . Not on file     Observations/Objective: Speaking normally, no distress noted over the phone.  Assessment and Plan: Migraine without status migrainosus, not intractable, unspecified migraine type - Plan: Ambulatory referral to Neurology  -Now requesting Emgality.  Referral placed to neurology for ongoing management of migraine headaches.  Will check further into Emgality to see if that can be prescribed sooner.  RTC/ER precautions if change in headaches  Vitamin D deficiency  -Low off meds.  Expect this to improve back on supplementation.  Plan for recheck levels 2 to 3 months  Follow Up  Instructions:    I discussed the assessment and treatment plan with the patient. The patient was provided an opportunity to ask questions and all were answered. The patient agreed with the plan and demonstrated an understanding of the instructions.   The patient was advised to call back or seek an in-person evaluation if the symptoms worsen or if the condition fails to improve as anticipated.  I provided 13 minutes of non-face-to-face time during this encounter.  Signed,   Merri Ray, MD [facility].  07/26/18 8:32 AM

## 2018-07-24 NOTE — Patient Instructions (Addendum)
  I will check into Emgality, but have also placed a referral back to neurology to help with the management of your migraine headaches.  Return to the clinic or go to the nearest emergency room if any of your symptoms worsen or new symptoms occur.   Expect vitamin D levels to improve now that you are back on the supplement.  Can plan to recheck those levels in the next 2 to 3 months with new primary care provider if needed or with your previous provider.  Let us know if there are questions in the meantime.   If you have lab work done today you will be contacted with your lab results within the next 2 weeks.  If you have not heard from [place] then please contact us. The fastest way to get your results is to register for [name].   IF you received an x-ray today, you will receive an invoice from [facility]. Please contact [facility] at [phone] with questions or concerns regarding your invoice.   IF you received labwork today, you will receive an invoice from [facility]. Please contact [name] at [phone] with questions or concerns regarding your invoice.   Our billing staff will not be able to assist you with questions regarding bills from these companies.  You will be contacted with the lab results as soon as they are available. The fastest way to get your results is to activate your [name] account. Instructions are located on the last page of this paperwork. If you have not heard from [place] regarding the results in 2 weeks, please contact this office.

## 2018-07-24 NOTE — Progress Notes (Signed)
Patient stated he was offered a Medication called Emgality for migraine a while back by Dr Titus Dubin. He did not take it cause it was to expensive, so he was unable to afford that medication at that time. Patient was wondering would the medication Emgality could eliminate some of the other medication he is currently taking like tramadol, gabapentin. Phenergan, flexeril, tylenol #2 and butalbital-APAP-Caffeine medication?

## 2018-08-05 ENCOUNTER — Ambulatory Visit: Payer: Self-pay

## 2018-08-19 DIAGNOSIS — J309 Allergic rhinitis, unspecified: Secondary | ICD-10-CM | POA: Diagnosis not present

## 2018-08-25 DIAGNOSIS — J0100 Acute maxillary sinusitis, unspecified: Secondary | ICD-10-CM | POA: Diagnosis not present

## 2018-08-25 DIAGNOSIS — K3184 Gastroparesis: Secondary | ICD-10-CM | POA: Diagnosis not present

## 2018-09-24 ENCOUNTER — Telehealth: Payer: PPO | Admitting: Urology

## 2018-10-14 ENCOUNTER — Ambulatory Visit: Payer: PPO | Admitting: Physical Therapy

## 2018-10-22 ENCOUNTER — Telehealth: Payer: PPO | Admitting: Neurology

## 2018-10-24 DIAGNOSIS — R1030 Lower abdominal pain, unspecified: Secondary | ICD-10-CM | POA: Diagnosis not present

## 2018-10-24 DIAGNOSIS — G8929 Other chronic pain: Secondary | ICD-10-CM | POA: Diagnosis not present

## 2018-10-24 DIAGNOSIS — N5082 Scrotal pain: Secondary | ICD-10-CM | POA: Diagnosis not present

## 2018-10-24 DIAGNOSIS — N50812 Left testicular pain: Secondary | ICD-10-CM | POA: Diagnosis not present

## 2018-10-24 DIAGNOSIS — G43719 Chronic migraine without aura, intractable, without status migrainosus: Secondary | ICD-10-CM | POA: Diagnosis not present

## 2018-10-24 DIAGNOSIS — M6289 Other specified disorders of muscle: Secondary | ICD-10-CM | POA: Diagnosis not present

## 2018-10-24 DIAGNOSIS — I471 Supraventricular tachycardia: Secondary | ICD-10-CM | POA: Diagnosis not present

## 2018-10-24 DIAGNOSIS — R800 Isolated proteinuria: Secondary | ICD-10-CM | POA: Diagnosis not present

## 2018-10-24 DIAGNOSIS — R002 Palpitations: Secondary | ICD-10-CM | POA: Diagnosis not present

## 2018-10-28 DIAGNOSIS — R002 Palpitations: Secondary | ICD-10-CM | POA: Diagnosis not present

## 2018-10-28 DIAGNOSIS — I471 Supraventricular tachycardia: Secondary | ICD-10-CM | POA: Diagnosis not present

## 2018-10-28 DIAGNOSIS — G8929 Other chronic pain: Secondary | ICD-10-CM | POA: Diagnosis not present

## 2018-10-28 DIAGNOSIS — R800 Isolated proteinuria: Secondary | ICD-10-CM | POA: Diagnosis not present

## 2018-10-28 DIAGNOSIS — N5082 Scrotal pain: Secondary | ICD-10-CM | POA: Diagnosis not present

## 2018-10-28 DIAGNOSIS — G43719 Chronic migraine without aura, intractable, without status migrainosus: Secondary | ICD-10-CM | POA: Diagnosis not present

## 2018-10-28 DIAGNOSIS — N50812 Left testicular pain: Secondary | ICD-10-CM | POA: Diagnosis not present

## 2018-10-28 DIAGNOSIS — R109 Unspecified abdominal pain: Secondary | ICD-10-CM | POA: Diagnosis not present

## 2018-11-17 ENCOUNTER — Telehealth: Payer: PPO | Admitting: Neurology

## 2018-12-05 DIAGNOSIS — G43909 Migraine, unspecified, not intractable, without status migrainosus: Secondary | ICD-10-CM | POA: Diagnosis not present

## 2018-12-05 DIAGNOSIS — J0101 Acute recurrent maxillary sinusitis: Secondary | ICD-10-CM | POA: Diagnosis not present

## 2018-12-09 ENCOUNTER — Ambulatory Visit: Payer: PPO | Admitting: Urology

## 2019-01-12 ENCOUNTER — Telehealth: Payer: Self-pay | Admitting: Cardiovascular Disease

## 2019-01-12 NOTE — Telephone Encounter (Signed)
New Message:   Pt wants to know if Dr Angelena Form think it would be alight to start  Diltiazem 120 mg? He used to take Diltiazem 30 mg.

## 2019-01-12 NOTE — Telephone Encounter (Signed)
Pt says his palpitations are coming much more frequently than they have in the past.. 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   He says he has some light headedness with them.. he takes a diltiazem 30 mg and rests and they go away.   Pt says Dr. Angelena Form has tried him on the daily Diltiazem 120 mg in the past and he could not tolerate due to feeling "swimmy headed".. but he thinks he needs more coverage now and wants to try it again.    Last OV 08/2017.[name]  Next OV 05/2019.    Will forward to Dr. Angelena Form for advice.

## 2019-01-13 MED ORDER — DILTIAZEM HCL ER COATED BEADS 120 MG PO CP24: 120.0000 mg | ORAL_CAPSULE | Freq: Every day | ORAL | 3 refills | Status: DC

## 2019-01-13 NOTE — Telephone Encounter (Signed)
Left message to call office

## 2019-01-13 NOTE — Telephone Encounter (Signed)
We can write for Cardizem CD 120 mg for him to take daily. Thanks, chris

## 2019-01-13 NOTE — Telephone Encounter (Signed)
I spoke with pt and gave him instructions from Dr. Angelena Form.  Will send prescription for Cardizem CD to [facility].  PRN Cardizem and vagal maneuvers discussed with pt as outlined in last office note.

## 2019-01-20 DIAGNOSIS — R5383 Other fatigue: Secondary | ICD-10-CM | POA: Diagnosis not present

## 2019-01-20 DIAGNOSIS — R102 Pelvic and perineal pain: Secondary | ICD-10-CM | POA: Diagnosis not present

## 2019-01-20 DIAGNOSIS — G8929 Other chronic pain: Secondary | ICD-10-CM | POA: Diagnosis not present

## 2019-01-20 DIAGNOSIS — E559 Vitamin D deficiency, unspecified: Secondary | ICD-10-CM | POA: Diagnosis not present

## 2019-01-20 DIAGNOSIS — R110 Nausea: Secondary | ICD-10-CM | POA: Diagnosis not present

## 2019-01-20 DIAGNOSIS — G43719 Chronic migraine without aura, intractable, without status migrainosus: Secondary | ICD-10-CM | POA: Diagnosis not present

## 2019-01-20 DIAGNOSIS — R002 Palpitations: Secondary | ICD-10-CM | POA: Diagnosis not present

## 2019-01-20 DIAGNOSIS — N50812 Left testicular pain: Secondary | ICD-10-CM | POA: Diagnosis not present

## 2019-01-20 DIAGNOSIS — N5082 Scrotal pain: Secondary | ICD-10-CM | POA: Diagnosis not present

## 2019-02-06 DIAGNOSIS — E611 Iron deficiency: Secondary | ICD-10-CM | POA: Diagnosis not present

## 2019-02-06 DIAGNOSIS — Z1211 Encounter for screening for malignant neoplasm of colon: Secondary | ICD-10-CM | POA: Diagnosis not present

## 2019-02-06 DIAGNOSIS — Z23 Encounter for immunization: Secondary | ICD-10-CM | POA: Diagnosis not present

## 2019-02-06 DIAGNOSIS — Z136 Encounter for screening for cardiovascular disorders: Secondary | ICD-10-CM | POA: Diagnosis not present

## 2019-02-06 DIAGNOSIS — R5382 Chronic fatigue, unspecified: Secondary | ICD-10-CM | POA: Diagnosis not present

## 2019-02-06 DIAGNOSIS — Z13220 Encounter for screening for lipoid disorders: Secondary | ICD-10-CM | POA: Diagnosis not present

## 2019-02-06 DIAGNOSIS — Z125 Encounter for screening for malignant neoplasm of prostate: Secondary | ICD-10-CM | POA: Diagnosis not present

## 2019-02-06 DIAGNOSIS — J0100 Acute maxillary sinusitis, unspecified: Secondary | ICD-10-CM | POA: Diagnosis not present

## 2019-02-06 DIAGNOSIS — E559 Vitamin D deficiency, unspecified: Secondary | ICD-10-CM | POA: Diagnosis not present

## 2019-02-06 DIAGNOSIS — E538 Deficiency of other specified B group vitamins: Secondary | ICD-10-CM | POA: Diagnosis not present

## 2019-02-11 DIAGNOSIS — E559 Vitamin D deficiency, unspecified: Secondary | ICD-10-CM | POA: Diagnosis not present

## 2019-02-12 DIAGNOSIS — R800 Isolated proteinuria: Secondary | ICD-10-CM | POA: Diagnosis not present

## 2019-02-12 DIAGNOSIS — E538 Deficiency of other specified B group vitamins: Secondary | ICD-10-CM | POA: Diagnosis not present

## 2019-02-12 DIAGNOSIS — Z1211 Encounter for screening for malignant neoplasm of colon: Secondary | ICD-10-CM | POA: Diagnosis not present

## 2019-02-16 ENCOUNTER — Ambulatory Visit: Payer: PPO | Admitting: Neurology

## 2019-02-17 DIAGNOSIS — R102 Pelvic and perineal pain: Secondary | ICD-10-CM | POA: Diagnosis not present

## 2019-02-17 DIAGNOSIS — M6289 Other specified disorders of muscle: Secondary | ICD-10-CM | POA: Diagnosis not present

## 2019-02-17 DIAGNOSIS — N5082 Scrotal pain: Secondary | ICD-10-CM | POA: Diagnosis not present

## 2019-02-17 DIAGNOSIS — G8929 Other chronic pain: Secondary | ICD-10-CM | POA: Diagnosis not present

## 2019-02-17 DIAGNOSIS — R002 Palpitations: Secondary | ICD-10-CM | POA: Diagnosis not present

## 2019-02-17 DIAGNOSIS — E538 Deficiency of other specified B group vitamins: Secondary | ICD-10-CM | POA: Diagnosis not present

## 2019-02-17 DIAGNOSIS — M5010 Cervical disc disorder with radiculopathy, unspecified cervical region: Secondary | ICD-10-CM | POA: Diagnosis not present

## 2019-02-17 DIAGNOSIS — G43719 Chronic migraine without aura, intractable, without status migrainosus: Secondary | ICD-10-CM | POA: Diagnosis not present

## 2019-02-17 DIAGNOSIS — F4323 Adjustment disorder with mixed anxiety and depressed mood: Secondary | ICD-10-CM | POA: Diagnosis not present

## 2019-02-17 DIAGNOSIS — I471 Supraventricular tachycardia: Secondary | ICD-10-CM | POA: Diagnosis not present

## 2019-02-17 DIAGNOSIS — N50812 Left testicular pain: Secondary | ICD-10-CM | POA: Diagnosis not present

## 2019-02-17 DIAGNOSIS — R800 Isolated proteinuria: Secondary | ICD-10-CM | POA: Diagnosis not present

## 2019-02-19 DIAGNOSIS — E559 Vitamin D deficiency, unspecified: Secondary | ICD-10-CM | POA: Diagnosis not present

## 2019-02-20 ENCOUNTER — Telehealth: Payer: PPO | Admitting: Neurology

## 2019-02-24 DIAGNOSIS — I471 Supraventricular tachycardia: Secondary | ICD-10-CM | POA: Diagnosis not present

## 2019-02-24 DIAGNOSIS — J329 Chronic sinusitis, unspecified: Secondary | ICD-10-CM | POA: Diagnosis not present

## 2019-02-24 DIAGNOSIS — N5082 Scrotal pain: Secondary | ICD-10-CM | POA: Diagnosis not present

## 2019-02-24 DIAGNOSIS — R800 Isolated proteinuria: Secondary | ICD-10-CM | POA: Diagnosis not present

## 2019-02-24 DIAGNOSIS — M5010 Cervical disc disorder with radiculopathy, unspecified cervical region: Secondary | ICD-10-CM | POA: Diagnosis not present

## 2019-02-24 DIAGNOSIS — R002 Palpitations: Secondary | ICD-10-CM | POA: Diagnosis not present

## 2019-02-24 DIAGNOSIS — E539 Vitamin B deficiency, unspecified: Secondary | ICD-10-CM | POA: Diagnosis not present

## 2019-02-24 DIAGNOSIS — R109 Unspecified abdominal pain: Secondary | ICD-10-CM | POA: Diagnosis not present

## 2019-02-24 DIAGNOSIS — E538 Deficiency of other specified B group vitamins: Secondary | ICD-10-CM | POA: Diagnosis not present

## 2019-02-24 DIAGNOSIS — G43719 Chronic migraine without aura, intractable, without status migrainosus: Secondary | ICD-10-CM | POA: Diagnosis not present

## 2019-02-24 DIAGNOSIS — N50812 Left testicular pain: Secondary | ICD-10-CM | POA: Diagnosis not present

## 2019-02-24 DIAGNOSIS — G8929 Other chronic pain: Secondary | ICD-10-CM | POA: Diagnosis not present

## 2019-02-24 DIAGNOSIS — F4323 Adjustment disorder with mixed anxiety and depressed mood: Secondary | ICD-10-CM | POA: Diagnosis not present

## 2019-02-26 DIAGNOSIS — R800 Isolated proteinuria: Secondary | ICD-10-CM | POA: Diagnosis not present

## 2019-02-26 DIAGNOSIS — I471 Supraventricular tachycardia: Secondary | ICD-10-CM | POA: Diagnosis not present

## 2019-02-26 DIAGNOSIS — R109 Unspecified abdominal pain: Secondary | ICD-10-CM | POA: Diagnosis not present

## 2019-02-26 DIAGNOSIS — F4323 Adjustment disorder with mixed anxiety and depressed mood: Secondary | ICD-10-CM | POA: Diagnosis not present

## 2019-02-26 DIAGNOSIS — E538 Deficiency of other specified B group vitamins: Secondary | ICD-10-CM | POA: Diagnosis not present

## 2019-02-26 DIAGNOSIS — N50812 Left testicular pain: Secondary | ICD-10-CM | POA: Diagnosis not present

## 2019-02-26 DIAGNOSIS — N5082 Scrotal pain: Secondary | ICD-10-CM | POA: Diagnosis not present

## 2019-02-26 DIAGNOSIS — R002 Palpitations: Secondary | ICD-10-CM | POA: Diagnosis not present

## 2019-02-26 DIAGNOSIS — J329 Chronic sinusitis, unspecified: Secondary | ICD-10-CM | POA: Diagnosis not present

## 2019-02-26 DIAGNOSIS — G8929 Other chronic pain: Secondary | ICD-10-CM | POA: Diagnosis not present

## 2019-02-26 DIAGNOSIS — G43719 Chronic migraine without aura, intractable, without status migrainosus: Secondary | ICD-10-CM | POA: Diagnosis not present

## 2019-02-26 DIAGNOSIS — M5010 Cervical disc disorder with radiculopathy, unspecified cervical region: Secondary | ICD-10-CM | POA: Diagnosis not present

## 2019-02-27 DIAGNOSIS — Z91048 Other nonmedicinal substance allergy status: Secondary | ICD-10-CM | POA: Diagnosis not present

## 2019-03-03 ENCOUNTER — Other Ambulatory Visit: Payer: Self-pay

## 2019-03-03 DIAGNOSIS — Z20822 Contact with and (suspected) exposure to covid-19: Secondary | ICD-10-CM

## 2019-03-04 LAB — NOVEL CORONAVIRUS, NAA: SARS-CoV-2, NAA: NOT DETECTED

## 2019-03-05 DIAGNOSIS — E539 Vitamin B deficiency, unspecified: Secondary | ICD-10-CM | POA: Diagnosis not present

## 2019-03-09 NOTE — Progress Notes (Signed)
Virtual Visit via Video Note The purpose of this virtual visit is to provide medical care while limiting exposure to the novel coronavirus.    Consent was obtained for video visit:  Yes.   Answered questions that patient had about telehealth interaction:  Yes.   I discussed the limitations, risks, security and privacy concerns of performing an evaluation and management service by telemedicine. I also discussed with the patient that there may be a patient responsible charge related to this service. The patient expressed understanding and agreed to proceed.  Pt location: Home Physician Location: office Name of referring provider:  Shawnee Knapp, MD I connected with [name] at patients initiation/request on 03/10/2019 at  9:10 AM EST by video enabled telemedicine application and verified that I am speaking with the correct person using two identifiers. Pt MRN:  [id number] Pt DOB:  1961-04-05 Video Participants:  [name]   History of Present Illness:  William [name] is a 58 year old right-handed male with hyperlipidemia, MVP, IBS, gastroparesis, generalized anxiety disorder and palpitations who presents for headaches.  I have seen patient intermittently since 2015.  At both previous visits, I had recommended Botox.  He never followed up.  Headaches improved for a while.  He got off on some of his antidepressants.  Then over the past 1 1/2 years, they have increased again.  He reports increased stress, such as caring for his mother.  He was found to have low vitamin D and B12, which were repleated  Onset: 1986.   Location: 90% of time: Left retro-orbital region, 10% bi-frontal Quality: stabbing Intensity: Fluctuates from 4/10-10/10 Aura: no Prodrome: no Associated symptoms: Lightheadedness, fatigue, nausea, tearing of left eye. Duration: 4 hours Frequency: 3 to 4 times a week Triggers/aggravating factors: Afrin, odors (perfumes, cigarette, chinese food), MSG,  salt, stress, extreme temperatures Relieving factors: Water Activity: About 4 to 5 days a week, cannot function  Current NSAIDS:  none Current analgesics:  Fioricet Current triptans:  Contraindicated (MVP) Current ergotamine:  none Current anti-emetic:  Phenergan 12.5mg  or Zofran Current muscle relaxants:  Robaxin, Flexeril Current anti-anxiolytic:  none Current sleep aide:  none Current Antihypertensive medications:  Cardizem Current Antidepressant medications:  none Current Anticonvulsant medications:  none Current anti-CGRP: He was prescribed Emgality but hasn't started it yet. Current Vitamins/Herbal/Supplements:  D Current Antihistamines/Decongestants:  Claritin, Flonase Other therapy:  none  Past NSAIDS: Indomethacin Past analgesics: Nucynta, lidocaine patches, tramadol, nalbuphine, Tylox, bupivacaine injections, hydrocodone, morphine, Tylenol #2, Tylenol Past abortive triptans:  Sumatriptan po/[place], Maxalt, Relpax, Amerge, Zomig Past abortive ergotamine:  Migranal, DHE protocol Past muscle relaxants:  none Past anti-emetic:  Zofran, Phenergan Past anxiolytics:  Temazepam, clonazepam Past antihypertensive medications:  Inderal, metoprolol, verapamil Past antidepressant medications:  Nortriptyline, amitriptyline, venlafaxine, Wellbutrin, Remeron Past anticonvulsant medications:  Depakote, topiramate (weight loss, diarrhea), Lyrica,Keppra, gabapentin Past anti-CGRP:  none Past vitamins/Herbal/Supplements:  petasites Past antihistamines/decongestants:  none Other past therapies:  Biofeedback, acupuncture, counseling    He has had multiple imaging of the head, all unremarkable, including:  CT HEAD from 06/01/09 and 07/30/09. MRI BRAIN W/WO from 12/08/02, 01/19/05, 04/18/12.  MRI of brain ordered by me from 11/19/2015 was personally reviewed and was normal.  CERVICAL XR from 01/15/08 showed mild degenerative disc disease at C5-6.  He has seen multiple neurologists and headache  and pain specialists, including Dr. Morene Antu, Dr. Asencion Partridge Dohmeieir, Dr. Earley Favor, Dr. Melton Alar, Dr. Rock Nephew at [facility] and he was seen at the [facility].  He also has history of some obsessive compulsive symptoms since he was young. For example, he repeatedly washes his hands and always uses a new cup during the day when he drinks something. He has history of low blood pressure and has been evaluated by cardiology for palpitations. Work up was unremarkable.  Past Medical History: Past Medical History:  Diagnosis Date  . Allergy   . Anxiety   . Depression   . Gastroparesis   . Heart murmur   . Migraine headache   . Mitral valve prolapse     Medications: Outpatient Encounter Medications as of 03/10/2019  Medication Sig Note  . acetaminophen-codeine (TYLENOL #2) 300-15 MG tablet Take 1 tablet by mouth every 4 (four) hours as needed for moderate pain (headaches). (Patient not taking: Reported on 07/24/2018)   . azelastine (OPTIVAR) 0.05 % ophthalmic solution Place 1 drop into both eyes 2 (two) times daily.   . butalbital-acetaminophen-caffeine (FIORICET, ESGIC) 50-325-40 MG tablet Take 1 tablet by mouth every 4 (four) hours as needed for headache.   . cyclobenzaprine (FLEXERIL) 10 MG tablet Take 1 tablet (10 mg total) by mouth 3 (three) times daily as needed for muscle spasms.   [name] diltiazem (CARDIZEM CD) 120 MG 24 hr capsule Take 1 capsule (120 mg total) by mouth daily.   [name] diltiazem (CARDIZEM) 30 MG tablet TAKE (1) TABLET AS NEEDED FOR PALPITATIONS   . fluticasone (FLONASE) 50 MCG/ACT nasal spray Place 2 sprays into both nostrils daily.   [name] loratadine (CLARITIN) 10 MG tablet Take 1 tablet (10 mg total) by mouth daily.   . methocarbamol (ROBAXIN) 500 MG tablet Take 1 tablet (500 mg total) by mouth 4 (four) times daily. For muscle spasm   . ondansetron (ZOFRAN) 4 MG tablet Take by mouth. 06/15/2017: prn  . promethazine (PHENERGAN) 12.5 MG tablet Take 1 tablet (12.5 mg total) by  mouth every 8 (eight) hours as needed (migraine headache).   . traMADol (ULTRAM) 50 MG tablet Take 1 tablet (50 mg total) by mouth at bedtime.   . Vitamin D, Ergocalciferol, (DRISDOL) 50000 units CAPS capsule Take 1 capsule (50,000 Units total) by mouth every 7 (seven) days.    No facility-administered encounter medications on file as of 03/10/2019.     Allergies: Allergies  Allergen Reactions  . Erythromycin Anaphylaxis and Nausea And Vomiting  . Nalbuphine Other (See Comments)    States loss of consciousness   . Cefdinir Diarrhea, Nausea And Vomiting and Rash  . Cephalexin Diarrhea and Nausea And Vomiting  . Cephalosporins Diarrhea and Nausea And Vomiting  . Hydrocodone-Acetaminophen Nausea And Vomiting  . Ketorolac Nausea And Vomiting  . Prochlorperazine Edisylate Other (See Comments)    uncontrollable shake - tremor   . Restasis [Cyclosporine] Other (See Comments)    migraines  . Tyloxapol Swelling  . Propranolol Diarrhea  . Topamax [Topiramate] Diarrhea and Nausea And Vomiting  . Zetia [Ezetimibe] Diarrhea  . Doxycycline Hyclate Nausea Only and Other (See Comments)    abd pain  . Iodinated Diagnostic Agents Other (See Comments)    Lightheaded, flushed feeling (explained to patient these are normal side effects of IV iodinated contrast, not allergies, but he wanted this noted in the epic; he tolerated epidural steroid injections w/ contrast w/o difficulty)  . Ketoconazole Itching  . Latex Itching  . Neosporin [Neomycin-Bacitracin Zn-Polymyx] Rash  . Oxycodone-Acetaminophen Other (See Comments)    Tylox caused constipation  . Prednisone Nausea And Vomiting    Oral route, only  Family History: Family History  Problem Relation Age of Onset  . Hypertension Mother   . Hypertension Brother   . Kidney failure Maternal Grandmother   . Prostate cancer Neg Hx   . Kidney cancer Neg Hx     Social History: Social History   Socioeconomic History  . Marital status:  Married    Spouse name: Not on file  . Number of children: Not on file  . Years of education: Not on file  . Highest education level: Not on file  Occupational History  . Not on file  Social Needs  . Financial resource strain: Not on file  . Food insecurity    Worry: Not on file    Inability: Not on file  . Transportation needs    Medical: Not on file    Non-medical: Not on file  Tobacco Use  . Smoking status: Never Smoker  . Smokeless tobacco: Never Used  Substance and Sexual Activity  . Alcohol use: No    Alcohol/week: 0.0 standard drinks  . Drug use: No  . Sexual activity: Yes    Partners: Female  Lifestyle  . Physical activity    Days per week: Not on file    Minutes per session: Not on file  . Stress: Not on file  Relationships  . Social Herbalist on phone: Not on file    Gets together: Not on file    Attends religious service: Not on file    Active member of club or organization: Not on file    Attends meetings of clubs or organizations: Not on file    Relationship status: Not on file  . Intimate partner violence    Fear of current or ex partner: Not on file    Emotionally abused: Not on file    Physically abused: Not on file    Forced sexual activity: Not on file  Other Topics Concern  . Not on file  Social History Narrative  . Not on file    Observations/Objective:   Height 5\' 10"  (1.778 m), weight 160 lb (72.6 kg). No acute distress.  Alert and oriented.  Speech fluent and not dysarthric.  Language intact.  Eyes orthophoric on primary gaze.  Face symmetric.  Assessment and Plan:   Chronic migraine without aura, without status migrainosus, not intractable  1.  For preventative management, agree with Emgality 2.  For abortive therapy, will try Nurtec.   Triptans are contraindicated.  He has tried and failed multiple over the counter medications, as well as alternative prescription medications such as Fioricet and tramadol. 3.  Limit use of  pain relievers to no more than 2 days out of week to prevent risk of rebound or medication-overuse headache. 4.  Keep headache diary 5.  Exercise, hydration, caffeine cessation, sleep hygiene, monitor for and avoid triggers 6.  Consider:  magnesium citrate 400mg  daily, riboflavin 400mg  daily, and coenzyme Q10 100mg  three times daily 7. Follow up 4 months    Follow Up Instructions:    -I discussed the assessment and treatment plan with the patient. The patient was provided an opportunity to ask questions and all were answered. The patient agreed with the plan and demonstrated an understanding of the instructions.   The patient was advised to call back or seek an in-person evaluation if the symptoms worsen or if the condition fails to improve as anticipated.    Total Time spent in visit with the patient was:  40 minutes Adam  Melvern Sample, DO

## 2019-03-10 ENCOUNTER — Other Ambulatory Visit: Payer: Self-pay

## 2019-03-10 ENCOUNTER — Encounter: Payer: Self-pay | Admitting: Neurology

## 2019-03-10 ENCOUNTER — Telehealth (INDEPENDENT_AMBULATORY_CARE_PROVIDER_SITE_OTHER): Payer: PPO | Admitting: Neurology

## 2019-03-10 VITALS — Ht 70.0 in | Wt 160.0 lb

## 2019-03-10 DIAGNOSIS — G43709 Chronic migraine without aura, not intractable, without status migrainosus: Secondary | ICD-10-CM

## 2019-03-10 MED ORDER — NURTEC 75 MG PO TBDP: 75.0000 mg | ORAL_TABLET | ORAL | 11 refills | Status: DC | PRN

## 2019-03-11 ENCOUNTER — Encounter: Payer: Self-pay | Admitting: *Deleted

## 2019-03-11 NOTE — Progress Notes (Signed)
William [name] (Key: [id number]) Nurtec 75MG  dispersible tablets   Form [employer] 4-Part [employer]CP[employer] Electronic PA Form Created 31 minutes ago Sent to Plan 27 minutes ago Plan Response 27 minutes ago Submit Clinical Questions 2 minutes ago Determination Wait for Determination Please wait for [employer] NCPDP to return a determination.

## 2019-03-12 NOTE — Progress Notes (Signed)
Received fax from [facility] ODT 75 MG STATUS: APPROVED Through 04/29/20  Phone: [phone] Fax: [phone]

## 2019-03-12 NOTE — Progress Notes (Signed)
Form sent to be scanned.

## 2019-03-13 DIAGNOSIS — R800 Isolated proteinuria: Secondary | ICD-10-CM | POA: Diagnosis not present

## 2019-03-13 DIAGNOSIS — N5082 Scrotal pain: Secondary | ICD-10-CM | POA: Diagnosis not present

## 2019-03-13 DIAGNOSIS — R002 Palpitations: Secondary | ICD-10-CM | POA: Diagnosis not present

## 2019-03-13 DIAGNOSIS — G43719 Chronic migraine without aura, intractable, without status migrainosus: Secondary | ICD-10-CM | POA: Diagnosis not present

## 2019-03-13 DIAGNOSIS — R109 Unspecified abdominal pain: Secondary | ICD-10-CM | POA: Diagnosis not present

## 2019-03-13 DIAGNOSIS — G8929 Other chronic pain: Secondary | ICD-10-CM | POA: Diagnosis not present

## 2019-03-13 DIAGNOSIS — N50812 Left testicular pain: Secondary | ICD-10-CM | POA: Diagnosis not present

## 2019-03-13 DIAGNOSIS — E538 Deficiency of other specified B group vitamins: Secondary | ICD-10-CM | POA: Diagnosis not present

## 2019-03-13 DIAGNOSIS — J329 Chronic sinusitis, unspecified: Secondary | ICD-10-CM | POA: Diagnosis not present

## 2019-03-13 DIAGNOSIS — F4323 Adjustment disorder with mixed anxiety and depressed mood: Secondary | ICD-10-CM | POA: Diagnosis not present

## 2019-03-13 DIAGNOSIS — M5010 Cervical disc disorder with radiculopathy, unspecified cervical region: Secondary | ICD-10-CM | POA: Diagnosis not present

## 2019-03-13 DIAGNOSIS — I471 Supraventricular tachycardia: Secondary | ICD-10-CM | POA: Diagnosis not present

## 2019-03-16 DIAGNOSIS — Z1159 Encounter for screening for other viral diseases: Secondary | ICD-10-CM | POA: Diagnosis not present

## 2019-03-16 DIAGNOSIS — G43719 Chronic migraine without aura, intractable, without status migrainosus: Secondary | ICD-10-CM | POA: Diagnosis not present

## 2019-03-16 DIAGNOSIS — F438 Other reactions to severe stress: Secondary | ICD-10-CM | POA: Diagnosis not present

## 2019-03-16 DIAGNOSIS — Z91138 Patient's unintentional underdosing of medication regimen for other reason: Secondary | ICD-10-CM | POA: Diagnosis not present

## 2019-03-16 DIAGNOSIS — F4323 Adjustment disorder with mixed anxiety and depressed mood: Secondary | ICD-10-CM | POA: Diagnosis not present

## 2019-03-16 DIAGNOSIS — F430 Acute stress reaction: Secondary | ICD-10-CM | POA: Diagnosis not present

## 2019-03-16 DIAGNOSIS — J329 Chronic sinusitis, unspecified: Secondary | ICD-10-CM | POA: Diagnosis not present

## 2019-03-17 ENCOUNTER — Ambulatory Visit: Payer: PPO | Admitting: Urology

## 2019-03-17 ENCOUNTER — Other Ambulatory Visit: Payer: Self-pay | Admitting: Neurology

## 2019-04-21 DIAGNOSIS — F430 Acute stress reaction: Secondary | ICD-10-CM | POA: Diagnosis not present

## 2019-04-21 DIAGNOSIS — E538 Deficiency of other specified B group vitamins: Secondary | ICD-10-CM | POA: Diagnosis not present

## 2019-04-21 DIAGNOSIS — F4323 Adjustment disorder with mixed anxiety and depressed mood: Secondary | ICD-10-CM | POA: Diagnosis not present

## 2019-04-21 DIAGNOSIS — F438 Other reactions to severe stress: Secondary | ICD-10-CM | POA: Diagnosis not present

## 2019-04-21 DIAGNOSIS — G43719 Chronic migraine without aura, intractable, without status migrainosus: Secondary | ICD-10-CM | POA: Diagnosis not present

## 2019-04-21 DIAGNOSIS — M5412 Radiculopathy, cervical region: Secondary | ICD-10-CM | POA: Diagnosis not present

## 2019-04-21 DIAGNOSIS — R202 Paresthesia of skin: Secondary | ICD-10-CM | POA: Diagnosis not present

## 2019-04-21 DIAGNOSIS — R42 Dizziness and giddiness: Secondary | ICD-10-CM | POA: Diagnosis not present

## 2019-04-21 DIAGNOSIS — G8929 Other chronic pain: Secondary | ICD-10-CM | POA: Diagnosis not present

## 2019-04-21 DIAGNOSIS — I471 Supraventricular tachycardia: Secondary | ICD-10-CM | POA: Diagnosis not present

## 2019-04-21 DIAGNOSIS — M5413 Radiculopathy, cervicothoracic region: Secondary | ICD-10-CM | POA: Diagnosis not present

## 2019-04-21 DIAGNOSIS — Z91138 Patient's unintentional underdosing of medication regimen for other reason: Secondary | ICD-10-CM | POA: Diagnosis not present

## 2019-04-21 DIAGNOSIS — R109 Unspecified abdominal pain: Secondary | ICD-10-CM | POA: Diagnosis not present

## 2019-04-21 DIAGNOSIS — N5082 Scrotal pain: Secondary | ICD-10-CM | POA: Diagnosis not present

## 2019-04-21 DIAGNOSIS — R002 Palpitations: Secondary | ICD-10-CM | POA: Diagnosis not present

## 2019-04-21 DIAGNOSIS — M5010 Cervical disc disorder with radiculopathy, unspecified cervical region: Secondary | ICD-10-CM | POA: Diagnosis not present

## 2019-04-21 DIAGNOSIS — M5415 Radiculopathy, thoracolumbar region: Secondary | ICD-10-CM | POA: Diagnosis not present

## 2019-04-21 DIAGNOSIS — J329 Chronic sinusitis, unspecified: Secondary | ICD-10-CM | POA: Diagnosis not present

## 2019-04-21 DIAGNOSIS — N50812 Left testicular pain: Secondary | ICD-10-CM | POA: Diagnosis not present

## 2019-04-28 ENCOUNTER — Ambulatory Visit: Payer: PPO | Attending: Internal Medicine

## 2019-04-28 DIAGNOSIS — Z20828 Contact with and (suspected) exposure to other viral communicable diseases: Secondary | ICD-10-CM | POA: Diagnosis not present

## 2019-04-28 DIAGNOSIS — Z20822 Contact with and (suspected) exposure to covid-19: Secondary | ICD-10-CM

## 2019-04-29 ENCOUNTER — Other Ambulatory Visit: Payer: Self-pay | Admitting: Family Medicine

## 2019-04-29 ENCOUNTER — Ambulatory Visit
Admission: RE | Admit: 2019-04-29 | Discharge: 2019-04-29 | Disposition: A | Discharge status: Home / Self Care | Payer: PPO | Source: Ambulatory Visit | Attending: Family Medicine | Admitting: Family Medicine

## 2019-04-29 DIAGNOSIS — M5413 Radiculopathy, cervicothoracic region: Secondary | ICD-10-CM

## 2019-04-29 DIAGNOSIS — M4802 Spinal stenosis, cervical region: Secondary | ICD-10-CM | POA: Diagnosis not present

## 2019-04-29 DIAGNOSIS — M4722 Other spondylosis with radiculopathy, cervical region: Secondary | ICD-10-CM | POA: Diagnosis not present

## 2019-04-29 LAB — NOVEL CORONAVIRUS, NAA: SARS-CoV-2, NAA: NOT DETECTED

## 2019-05-05 DIAGNOSIS — N50812 Left testicular pain: Secondary | ICD-10-CM | POA: Diagnosis not present

## 2019-05-05 DIAGNOSIS — R202 Paresthesia of skin: Secondary | ICD-10-CM | POA: Diagnosis not present

## 2019-05-05 DIAGNOSIS — M5010 Cervical disc disorder with radiculopathy, unspecified cervical region: Secondary | ICD-10-CM | POA: Diagnosis not present

## 2019-05-05 DIAGNOSIS — D519 Vitamin B12 deficiency anemia, unspecified: Secondary | ICD-10-CM | POA: Diagnosis not present

## 2019-05-05 DIAGNOSIS — E559 Vitamin D deficiency, unspecified: Secondary | ICD-10-CM | POA: Diagnosis not present

## 2019-05-05 DIAGNOSIS — M5415 Radiculopathy, thoracolumbar region: Secondary | ICD-10-CM | POA: Diagnosis not present

## 2019-05-05 DIAGNOSIS — R200 Anesthesia of skin: Secondary | ICD-10-CM | POA: Diagnosis not present

## 2019-05-05 DIAGNOSIS — N5082 Scrotal pain: Secondary | ICD-10-CM | POA: Diagnosis not present

## 2019-05-05 DIAGNOSIS — R5382 Chronic fatigue, unspecified: Secondary | ICD-10-CM | POA: Diagnosis not present

## 2019-05-05 DIAGNOSIS — G43719 Chronic migraine without aura, intractable, without status migrainosus: Secondary | ICD-10-CM | POA: Diagnosis not present

## 2019-05-05 DIAGNOSIS — J329 Chronic sinusitis, unspecified: Secondary | ICD-10-CM | POA: Diagnosis not present

## 2019-05-05 DIAGNOSIS — R109 Unspecified abdominal pain: Secondary | ICD-10-CM | POA: Diagnosis not present

## 2019-05-06 ENCOUNTER — Encounter: Payer: Self-pay | Admitting: Cardiovascular Disease

## 2019-05-06 ENCOUNTER — Ambulatory Visit (INDEPENDENT_AMBULATORY_CARE_PROVIDER_SITE_OTHER): Payer: PPO | Admitting: Family Medicine

## 2019-05-06 ENCOUNTER — Other Ambulatory Visit: Payer: Self-pay

## 2019-05-06 VITALS — BP 118/82 | HR 69 | Ht 70.0 in | Wt 163.0 lb

## 2019-05-06 DIAGNOSIS — M2550 Pain in unspecified joint: Secondary | ICD-10-CM | POA: Diagnosis not present

## 2019-05-06 DIAGNOSIS — M5030 Other cervical disc degeneration, unspecified cervical region: Secondary | ICD-10-CM

## 2019-05-06 DIAGNOSIS — M5416 Radiculopathy, lumbar region: Secondary | ICD-10-CM

## 2019-05-06 MED ORDER — KETOROLAC TROMETHAMINE 60 MG/2ML IM SOLN
60.0000 mg | Freq: Once | INTRAMUSCULAR | Status: AC
  Administered 2019-05-06: 14:00:00 60 mg via INTRAMUSCULAR

## 2019-05-06 MED ORDER — TIZANIDINE HCL 4 MG PO TABS: 4.0000 mg | ORAL_TABLET | Freq: Every day | ORAL | 0 refills | Status: DC

## 2019-05-06 MED ORDER — METHYLPREDNISOLONE ACETATE 80 MG/ML IJ SUSP
80.0000 mg | Freq: Once | INTRAMUSCULAR | Status: AC
  Administered 2019-05-06: 80 mg via INTRAMUSCULAR

## 2019-05-06 NOTE — Progress Notes (Signed)
Chief Complaint  Patient presents with  . Follow-up    SVT    History of Present Illness: 59 yo male with history of palpitations/PVCs, SVT, migraine headaches, mitral valve prolapse and irritable bowel syndrome who is here today for cardiac follow up. He has been known to have PVCs in the past but has not tolerated beta blockers. He had been on long acting Cardizem CD remotely. He had a normal echo and stress test in 2011. Holter monitor in 2011 with no evidence of SVT, atrial fibrillation. In 2014 he was admitted with a cough and chest pain and had a negative workup with no evidence of PE on Chest CTA and sinus tach on telemetry. Troponin was negative in the ED. He described sweating, coughing, fever, chills and pleuritic chest pain with cough. He had noticed his heart racing. I arranged a 48 hour monitor and an echo. Echo with normal LV size and function. 48 hour monitor with normal sinus rhythm and rare PACs. He was seen November 2018 in our office by Estella Husk, PA and had c/o palpitations. He had been seen in primary care with a URI and was felt to be dehydrated. He was given Diltiazem to take as needed. Cardiac event monitor November 2018 with sinus rhythm and one run of SVT (40 beats).  He was started on propranolol for his SVT by primary care but it caused diarrhea. He was started on Zetia but had diarrhea. I restarted his Cardizem CD when I saw him in February 2019 but he stopped taking it. The Cardizem was restarted at his office visit in 2018 but he preferred to use short acting Diltiazem for episodes of tachycardia. He thought it was making him fatigued. He has been found to be B12 deficient and is now on supplementation so he willing to try Diltiazem.   He is here today for follow up. The patient denies any chest pain, dyspnea, lower extremity edema, orthopnea, PND, dizziness, near syncope or syncope. He has continued to have palpitations several times per week.    Primary Care  Physician: Shawnee Knapp, MD  Past Medical History:  Diagnosis Date  . Allergy   . Anxiety   . Depression   . Gastroparesis   . Heart murmur   . Migraine headache   . Mitral valve prolapse   . SVT (supraventricular tachycardia) (HCC)    No past surgical history on file.  Current Outpatient Medications  Medication Sig Dispense Refill  . azelastine (OPTIVAR) 0.05 % ophthalmic solution Place 1 drop into both eyes 2 (two) times daily. 6 mL 12  . baclofen (LIORESAL) 10 MG tablet Take 10 mg by mouth 3 (three) times daily.    [name] diltiazem (CARDIZEM) 30 MG tablet TAKE (1) TABLET AS NEEDED FOR PALPITATIONS 30 tablet 5  . EMGALITY 120 MG/ML SOAJ     . fluticasone (FLONASE) 50 MCG/ACT nasal spray Place 2 sprays into both nostrils daily. 16 g 6  . methocarbamol (ROBAXIN) 500 MG tablet Take 1 tablet (500 mg total) by mouth 4 (four) times daily. For muscle spasm 60 tablet 1  . omeprazole (PRILOSEC) 40 MG capsule Take 40 mg by mouth daily.    . ondansetron (ZOFRAN) 4 MG tablet Take 4 mg by mouth as needed.     . Rimegepant Sulfate (NURTEC) 75 MG TBDP Take 75 mg by mouth as needed for up to 1 dose (Maximum 1 tablet in 24 hours.). 8 tablet 11  . tiZANidine (ZANAFLEX)  4 MG tablet Take 1 tablet (4 mg total) by mouth at bedtime. 30 tablet 0  . diltiazem (CARDIZEM CD) 120 MG 24 hr capsule Take 1 capsule (120 mg total) by mouth daily. 90 capsule 3   No current facility-administered medications for this visit.    Allergies  Allergen Reactions  . Erythromycin Anaphylaxis and Nausea And Vomiting  . Nalbuphine Other (See Comments)    States loss of consciousness   . Cefdinir Diarrhea, Nausea And Vomiting and Rash  . Cephalexin Diarrhea and Nausea And Vomiting  . Cephalosporins Diarrhea and Nausea And Vomiting  . Hydrocodone-Acetaminophen Nausea And Vomiting  . Ketorolac Nausea And Vomiting  . Prochlorperazine Edisylate Other (See Comments)    uncontrollable shake - tremor   . Restasis [Cyclosporine]  Other (See Comments)    migraines  . Tyloxapol Swelling  . Propranolol Diarrhea  . Topamax [Topiramate] Diarrhea and Nausea And Vomiting  . Zetia [Ezetimibe] Diarrhea  . Doxycycline Hyclate Nausea Only and Other (See Comments)    abd pain  . Iodinated Diagnostic Agents Other (See Comments)    Lightheaded, flushed feeling (explained to patient these are normal side effects of IV iodinated contrast, not allergies, but he wanted this noted in the epic; he tolerated epidural steroid injections w/ contrast w/o difficulty)  . Ketoconazole Itching  . Latex Itching  . Neosporin [Neomycin-Bacitracin Zn-Polymyx] Rash  . Oxycodone-Acetaminophen Other (See Comments)    Tylox caused constipation  . Prednisone Nausea And Vomiting    Oral route, only    Social History   Socioeconomic History  . Marital status: Married    Spouse name: Not on file  . Number of children: 0  . Years of education: Not on file  . Highest education level: Bachelor's degree (e.g., BA, AB, BS)  Occupational History  . Not on file  Tobacco Use  . Smoking status: Never Smoker  . Smokeless tobacco: Never Used  Substance and Sexual Activity  . Alcohol use: No    Alcohol/week: 0.0 standard drinks  . Drug use: No  . Sexual activity: Yes    Partners: Female  Other Topics Concern  . Not on file  Social History Narrative   Pt is married lives with spouse in 1 story apartment he has No children   BS degree- he is right handed   Drinks soda  Ginger ale, no coffee no tea   Social Determinants of Radio broadcast assistant Strain:   . Difficulty of Paying Living Expenses: Not on file  Food Insecurity:   . Worried About Charity fundraiser in the Last Year: Not on file  . Ran Out of Food in the Last Year: Not on file  Transportation Needs:   . Lack of Transportation (Medical): Not on file  . Lack of Transportation (Non-Medical): Not on file  Physical Activity:   . Days of Exercise per Week: Not on file  . Minutes  of Exercise per Session: Not on file  Stress:   . Feeling of Stress : Not on file  Social Connections:   . Frequency of Communication with Friends and Family: Not on file  . Frequency of Social Gatherings with Friends and Family: Not on file  . Attends Religious Services: Not on file  . Active Member of Clubs or Organizations: Not on file  . Attends Archivist Meetings: Not on file  . Marital Status: Not on file  Intimate Partner Violence:   . Fear of Current or Ex-Partner:  Not on file  . Emotionally Abused: Not on file  . Physically Abused: Not on file  . Sexually Abused: Not on file    Family History  Problem Relation Age of Onset  . Hypertension Mother   . Hypertension Brother   . Kidney failure Maternal Grandmother   . Prostate cancer Neg Hx   . Kidney cancer Neg Hx     Review of Systems:  As stated in the HPI and otherwise negative.   BP 122/76   Pulse 87   Ht 5\' 10"  (1.778 m)   Wt 164 lb 6.4 oz (74.6 kg)   SpO2 99%   BMI 23.59 kg/m   Physical Examination:  General: Well developed, well nourished, NAD  HEENT: OP clear, mucus membranes moist  SKIN: warm, dry. No rashes. Neuro: No focal deficits  Musculoskeletal: Muscle strength 5/5 all ext  Psychiatric: Mood and affect normal  Neck: No JVD, no carotid bruits, no thyromegaly, no lymphadenopathy.  Lungs:Clear bilaterally, no wheezes, rhonci, crackles Cardiovascular: Regular rate and rhythm. No murmurs, gallops or rubs. Abdomen:Soft. Bowel sounds present. Non-tender.  Extremities: No lower extremity edema. Pulses are 2 + in the bilateral DP/PT.  Echo 07/10/12: Left ventricle: The cavity size was normal. Wall thickness was normal. Systolic function was normal. The estimated ejection fraction was in the range of 60% to 65%. Wall motion was normal; there were no regional wall motion abnormalities. Left ventricular diastolic function parameters were normal.   EKG:  EKG is ordered today. The ekg ordered  today demonstrates NSR, rate 78 bpm  Recent Labs: 06/24/2018: ALT 20; BUN 15; Creatinine, Ser 1.15; Hemoglobin 15.6; Platelets 293; Potassium 4.4; Sodium 138; TSH 0.712   Lipid Panel    Component Value Date/Time   CHOL 233 (H) 12/29/2016 1036   TRIG 157 (H) 12/29/2016 1036   HDL 52 12/29/2016 1036   CHOLHDL 4.5 12/29/2016 1036   CHOLHDL 3.7 05/14/2015 0958   VLDL 25 05/14/2015 0958   LDLCALC 150 (H) 12/29/2016 1036     Wt Readings from Last 3 Encounters:  05/07/19 164 lb 6.4 oz (74.6 kg)  05/06/19 163 lb (73.9 kg)  03/10/19 160 lb (72.6 kg)     Other studies Reviewed: Additional studies/ records that were reviewed today include: . Review of the above records demonstrates:   Assessment and Plan:   1. SVT: Event monitor in January 2019 showed one 40 beat episode of SVT. Cardizem CD was started at his office visit in February 2019 but he stopped it. He did not wish to continue the long acting Diltiazem. He is now having more frequent palpitations. He is willing to restart Cardizem CD 120 mg daily. He will continue to use short acting Diltiazem as needed. He can use vagal maneuvers and short acting Diitiazem as needed for episodes of SVT. Ablation is always an option if his SVT is unable to be controlled with medications.    Current medicines are reviewed at length with the patient today.  The patient does not have concerns regarding medicines.  The following changes have been made:    Labs/ tests ordered today include:   Orders Placed This Encounter  Procedures  . EKG 12-Lead     Disposition:   FU with me in 12  months   Signed, Lauree Chandler, MD 05/07/2019 9:47 AM    [facility] [place], [place], [place]  [address] Phone: [phone]; Fax: [phone]

## 2019-05-06 NOTE — Patient Instructions (Signed)
Zanaflex at night Restart Dicolfenac in 2 days Exercises 3x a week Up to you on [employer] See me again in 3 weeks if not perfect
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
## 2019-05-07 ENCOUNTER — Encounter: Payer: Self-pay | Admitting: Cardiovascular Disease

## 2019-05-07 ENCOUNTER — Ambulatory Visit: Payer: PPO | Admitting: Cardiovascular Disease

## 2019-05-07 ENCOUNTER — Encounter: Payer: Self-pay | Admitting: Family Medicine

## 2019-05-07 VITALS — BP 122/76 | HR 87 | Ht 70.0 in | Wt 164.4 lb

## 2019-05-07 DIAGNOSIS — I471 Supraventricular tachycardia: Secondary | ICD-10-CM

## 2019-05-07 DIAGNOSIS — M5030 Other cervical disc degeneration, unspecified cervical region: Secondary | ICD-10-CM | POA: Insufficient documentation

## 2019-05-07 MED ORDER — DILTIAZEM HCL 30 MG PO TABS: ORAL_TABLET | ORAL | 5 refills | Status: DC

## 2019-05-07 MED ORDER — DILTIAZEM HCL ER COATED BEADS 120 MG PO CP24: 120.0000 mg | ORAL_CAPSULE | Freq: Every day | ORAL | 3 refills | Status: DC
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
## 2019-05-07 NOTE — Patient Instructions (Signed)
Medication Instructions:  Your physician has recommended you make the following change in your medication:  1.) start Cardizem CD 120 mg (long acting diltiazem) - take one tablet by mouth once a day  *If you need a refill on your cardiac medications before your next appointment, please call your pharmacy*  Lab Work: none If you have labs (blood work) drawn today and your tests are completely normal, you will receive your results only by: [name] MyChart Message (if you have MyChart) OR . A paper copy in the mail If you have any lab test that is abnormal or we need to change your treatment, we will call you to review the results.  Testing/Procedures: none  Follow-Up: At [facility], you and your health needs are our priority.  As part of our continuing mission to provide you with exceptional heart care, we have created designated Provider Care Teams.  These Care Teams include your primary Cardiologist (physician) and Advanced Practice Providers (APPs -  Physician Assistants and Nurse Practitioners) who all work together to provide you with the care you need, when you need it.  Your next appointment:   12 month(s)  The format for your next appointment:   Either In Person or Virtual  Provider:   You may see Lauree Chandler, MD or one of the following Advanced Practice Providers on your designated Care Team:    Melina Copa, PA-C  Ermalinda Barrios, PA-C   Other Instructions

## 2019-05-07 NOTE — Assessment & Plan Note (Signed)
Patient continues to have intermittent pain.  Baclofen has been given previously and encouraged him to take it on a regular basis but did not continue with the methocarbamol and take 1 or the other.  Toradol and Depo-Medrol given today.  Neck exam seems to have some loss of lordosis and do feel x-ray could be beneficial.  We will hold on laboratory work-up at the moment.  Vitamin D supplementation can continue.  Patient will follow-up again in 2 to 3 weeks

## 2019-05-08 ENCOUNTER — Ambulatory Visit: Payer: PPO | Admitting: Family Medicine

## 2019-05-12 ENCOUNTER — Other Ambulatory Visit (HOSPITAL_BASED_OUTPATIENT_CLINIC_OR_DEPARTMENT_OTHER): Payer: Self-pay | Admitting: Family Medicine

## 2019-05-12 DIAGNOSIS — M5030 Other cervical disc degeneration, unspecified cervical region: Secondary | ICD-10-CM

## 2019-05-12 DIAGNOSIS — M5010 Cervical disc disorder with radiculopathy, unspecified cervical region: Secondary | ICD-10-CM

## 2019-05-16 ENCOUNTER — Ambulatory Visit (HOSPITAL_BASED_OUTPATIENT_CLINIC_OR_DEPARTMENT_OTHER): Payer: PPO

## 2019-05-21 ENCOUNTER — Telehealth: Payer: Self-pay | Admitting: *Deleted

## 2019-05-21 NOTE — Telephone Encounter (Signed)
Pt called stating he saw Dr. Tamala Julian earlier this month for neck pain. He had a f/u appt with his PCP & she ordered an MRI. Pt wanted to check with Dr. Tamala Julian to see if he feels like the pt needs to have this done.

## 2019-05-21 NOTE — Telephone Encounter (Signed)
Patient planning on looking into places to get MRI as per a verbal from Dr. Tamala Julian patient should proceed with MRI if neck pain is persisting. Patient will call once scheduled.

## 2019-05-23 ENCOUNTER — Ambulatory Visit (HOSPITAL_BASED_OUTPATIENT_CLINIC_OR_DEPARTMENT_OTHER): Payer: PPO

## 2019-05-25 DIAGNOSIS — H1030 Unspecified acute conjunctivitis, unspecified eye: Secondary | ICD-10-CM | POA: Diagnosis not present

## 2019-05-27 DIAGNOSIS — H00015 Hordeolum externum left lower eyelid: Secondary | ICD-10-CM | POA: Diagnosis not present

## 2019-05-30 ENCOUNTER — Ambulatory Visit (HOSPITAL_BASED_OUTPATIENT_CLINIC_OR_DEPARTMENT_OTHER): Payer: PPO

## 2019-06-06 ENCOUNTER — Other Ambulatory Visit: Payer: Self-pay

## 2019-06-06 ENCOUNTER — Ambulatory Visit (HOSPITAL_BASED_OUTPATIENT_CLINIC_OR_DEPARTMENT_OTHER)
Admission: RE | Admit: 2019-06-06 | Discharge: 2019-06-06 | Disposition: A | Discharge status: Home / Self Care | Payer: PPO | Source: Ambulatory Visit | Attending: Family Medicine | Admitting: Family Medicine

## 2019-06-06 DIAGNOSIS — M5030 Other cervical disc degeneration, unspecified cervical region: Secondary | ICD-10-CM

## 2019-06-06 DIAGNOSIS — M5010 Cervical disc disorder with radiculopathy, unspecified cervical region: Secondary | ICD-10-CM

## 2019-06-06 DIAGNOSIS — M50223 Other cervical disc displacement at C6-C7 level: Secondary | ICD-10-CM | POA: Diagnosis not present

## 2019-06-09 ENCOUNTER — Ambulatory Visit (INDEPENDENT_AMBULATORY_CARE_PROVIDER_SITE_OTHER): Payer: PPO | Admitting: Family Medicine

## 2019-06-09 ENCOUNTER — Encounter: Payer: Self-pay | Admitting: Family Medicine

## 2019-06-09 DIAGNOSIS — M5030 Other cervical disc degeneration, unspecified cervical region: Secondary | ICD-10-CM

## 2019-06-09 DIAGNOSIS — M5416 Radiculopathy, lumbar region: Secondary | ICD-10-CM | POA: Diagnosis not present

## 2019-06-09 MED ORDER — GABAPENTIN 100 MG PO CAPS: 200.0000 mg | ORAL_CAPSULE | Freq: Every day | ORAL | 3 refills | Status: DC

## 2019-06-09 MED ORDER — PREDNISONE 50 MG PO TABS: 50.0000 mg | ORAL_TABLET | Freq: Every day | ORAL | 0 refills | Status: DC

## 2019-06-09 NOTE — Progress Notes (Signed)
Virtual Visit via Video Note  I connected with William [name] on 06/09/19 at  1:30 PM EST by a video enabled telemedicine application and verified that I am speaking with the correct person using two identifiers.  Location: Patient: in home setting alone  Provider: In office setting   I discussed the limitations of evaluation and management by telemedicine and the availability of in person appointments. The patient expressed understanding and agreed to proceed.  History of Present Illness: 59 year old gentleman coming in with neck and back pain.  Patient has had this for quite some time.  Known degenerative disc disease of the lumbar spine and did respond initially to epidurals last 1 back in 2019.  Patient is having very similar symptoms again.  Was having symptoms that he stated was very similar in his neck as well and was sent for an MRI by primary care provider.  This was independently visualized by me as well.  MRI of patient's neck showed that patient did have degenerative disc disease with moderate spinal stenosis at C5-C6.  This would correspond with some of the radicular symptoms patient has been having.  Patient describes the pain as a dull, throbbing aching sensation.  States that sometimes numbness and mild weakness.  Feels that he continues to have difficulty finding different ways to treat it.  Has noticed that gabapentin has helped in the past but seem to have some association with worsening headaches.    Observations/Objective: Alert and oriented x3, asking questions.   Assessment and Plan: 59 year old gentleman with degenerative disc disease of the cervical spine with moderate spinal stenosis as well as moderate spinal stenosis of the lumbar spine with radicular symptoms.  Discussed different treatment options and patient has elected to do gabapentin as well as prednisone 1 more burst.  See if this will be beneficial.  If no significant improvement encouraged him to do  epidurals of the neck and back.  Patient is in agreement with the plan.  We discussed over-the-counter medications including vitamin D of 2000 IUs daily, tart cherry extract 1200 mg at night as another potential.  Follow-up with me again potentially virtually in 2 to 4 weeks   Follow Up Instructions: 2-4 weeks     I discussed the assessment and treatment plan with the patient. The patient was provided an opportunity to ask questions and all were answered. The patient agreed with the plan and demonstrated an understanding of the instructions.   The patient was advised to call back or seek an in-person evaluation if the symptoms worsen or if the condition fails to improve as anticipated.  I provided 24 minutes of face-to-face time during this encounter.   Lyndal Pulley, DO

## 2019-06-10 DIAGNOSIS — I471 Supraventricular tachycardia: Secondary | ICD-10-CM | POA: Diagnosis not present

## 2019-06-10 DIAGNOSIS — R002 Palpitations: Secondary | ICD-10-CM | POA: Diagnosis not present

## 2019-06-10 DIAGNOSIS — G8929 Other chronic pain: Secondary | ICD-10-CM | POA: Diagnosis not present

## 2019-06-10 DIAGNOSIS — M6289 Other specified disorders of muscle: Secondary | ICD-10-CM | POA: Diagnosis not present

## 2019-06-10 DIAGNOSIS — E559 Vitamin D deficiency, unspecified: Secondary | ICD-10-CM | POA: Diagnosis not present

## 2019-06-10 DIAGNOSIS — R800 Isolated proteinuria: Secondary | ICD-10-CM | POA: Diagnosis not present

## 2019-06-10 DIAGNOSIS — N5082 Scrotal pain: Secondary | ICD-10-CM | POA: Diagnosis not present

## 2019-06-10 DIAGNOSIS — G43719 Chronic migraine without aura, intractable, without status migrainosus: Secondary | ICD-10-CM | POA: Diagnosis not present

## 2019-06-10 DIAGNOSIS — N50812 Left testicular pain: Secondary | ICD-10-CM | POA: Diagnosis not present

## 2019-06-10 DIAGNOSIS — R102 Pelvic and perineal pain: Secondary | ICD-10-CM | POA: Diagnosis not present

## 2019-06-10 DIAGNOSIS — D519 Vitamin B12 deficiency anemia, unspecified: Secondary | ICD-10-CM | POA: Diagnosis not present

## 2019-06-10 DIAGNOSIS — K3184 Gastroparesis: Secondary | ICD-10-CM | POA: Diagnosis not present

## 2019-07-07 NOTE — Progress Notes (Signed)
Virtual Visit via Video Note The purpose of this virtual visit is to provide medical care while limiting exposure to the novel coronavirus.    Consent was obtained for video visit:  Yes.   Answered questions that patient had about telehealth interaction:  Yes.   I discussed the limitations, risks, security and privacy concerns of performing an evaluation and management service by telemedicine. I also discussed with the patient that there may be a patient responsible charge related to this service. The patient expressed understanding and agreed to proceed.  Pt location: Home Physician Location: office Name of referring provider:  Shawnee Knapp, MD I connected with [name] at patients initiation/request on 07/08/2019 at  9:50 AM EST by video enabled telemedicine application and verified that I am speaking with the correct person using two identifiers. Pt MRN:  [id number] Pt DOB:  10-31-1960 Video Participants:  [name]   History of Present Illness:  William [name] is a 59 year old right-handed male with hyperlipidemia, MVP, IBS, gastroparesis, generalized anxiety disorder and palpitations who follows up for migraines.  UPDATE: In November, started Emgality for preventative treatment and Nurtec for rescue therapy.  Intensity:  Moderate to severe Duration:  Less than 15 minutes with Nurtec Frequency:  5 in past 30 days (2 were severe or what he would classify as migraine) Frequency of abortive medication: 5 days in past 30 days Current NSAIDS:  none Current analgesics:  Fioricet Current triptans:  Contraindicated (MVP) Current ergotamine:  none Current anti-emetic:  Zofran 4mg  Current muscle relaxants:  baclofen 10mg  TID Current anti-anxiolytic:  none Current sleep aide:  none Current Antihypertensive medications:  Cardizem Current Antidepressant medications:  none Current Anticonvulsant medications:  none Current anti-CGRP: Emgality; Nurtec Current  Vitamins/Herbal/Supplements:  D Current Antihistamines/Decongestants:  Claritin, Flonase Other therapy:  none  Caffeine:  No coffee, tea or caffeinated soda Diet:  Needs to improve.  Sometimes ginger ale Exercise:  No Depression:  no; Anxiety:  Yes.  Some stress related to caring for his mother Other pain:  no Sleep hygiene:  Poor.  Stress related to caregiver for his mother  HISTORY: Onset: 1986.   Location: 90% of time: Left retro-orbital region, 10% bi-frontal Quality:stabbing Initial intensity: Fluctuates from 4/10-10/10 Aura:no Prodrome:no Associated symptoms: Lightheadedness, fatigue, nausea, tearing of left eye. Initial duration: 4 hours Initial frequency: 3 to 4 times a week Triggers/aggravating factors: Afrin, odors (perfumes, cigarette, chinese food), MSG, salt, stress, extreme temperatures Relieving factors: Water Activity:About 4 to 5 days a week, cannot function  Past NSAIDS: Indomethacin Past analgesics: Nucynta, lidocaine patches, tramadol, nalbuphine, Tylox, bupivacaine injections, hydrocodone, morphine, Tylenol #2, Tylenol Past abortive triptans:  Sumatriptan po/[place], Maxalt, Relpax, Amerge, Zomig Past abortive ergotamine:  Migranal, DHE protocol Past muscle relaxants:  Robaxin, Flexeril Past anti-emetic:  Zofran, Phenergan Past anxiolytics:  Temazepam, clonazepam Past antihypertensive medications:  Inderal, metoprolol, verapamil Past antidepressant medications:  Nortriptyline, amitriptyline, venlafaxine, Wellbutrin, Remeron Past anticonvulsant medications:  Depakote, topiramate (weight loss, diarrhea), Lyrica,Keppra, gabapentin Past anti-CGRP:  none Past vitamins/Herbal/Supplements:  petasites Past antihistamines/decongestants:  none Other past therapies:  Biofeedback, acupuncture, counseling   He has had multiple imaging of the head, all unremarkable, including: CT HEAD from 06/01/09 and 07/30/09. MRI BRAIN W/WO from 12/08/02, 01/19/05, 04/18/12.   MRI of brain ordered by me from 11/19/2015 was personally reviewed and was normal.  CERVICAL XR from 01/15/08 showed mild degenerative disc disease at C5-6.  He has seen multiple neurologists and headache and pain specialists, including Dr.  Morene Antu, Dr. Asencion Partridge Dohmeieir, Dr. Earley Favor, Dr. Melton Alar, Dr. Rock Nephew at [facility] and he was seen at the [facility].  He also has history of some obsessive compulsive symptoms since he was young.For example, he repeatedly washes his hands and always uses a new cup during the day when he drinks something.He has history of low blood pressure and has been evaluated by cardiology for palpitations.Work up was unremarkable.  Past Medical History: Past Medical History:  Diagnosis Date  . Allergy   . Anxiety   . Depression   . Gastroparesis   . Heart murmur   . Migraine headache   . Mitral valve prolapse   . SVT (supraventricular tachycardia) (HCC)     Medications: Outpatient Encounter Medications as of 07/08/2019  Medication Sig  . azelastine (OPTIVAR) 0.05 % ophthalmic solution Place 1 drop into both eyes 2 (two) times daily.  . baclofen (LIORESAL) 10 MG tablet Take 10 mg by mouth 3 (three) times daily.  [name] diltiazem (CARDIZEM CD) 120 MG 24 hr capsule Take 1 capsule (120 mg total) by mouth daily.  [name] diltiazem (CARDIZEM) 30 MG tablet TAKE (1) TABLET AS NEEDED FOR PALPITATIONS  . EMGALITY 120 MG/ML SOAJ   . fluticasone (FLONASE) 50 MCG/ACT nasal spray Place 2 sprays into both nostrils daily.  [name] gabapentin (NEURONTIN) 100 MG capsule Take 2 capsules (200 mg total) by mouth at bedtime.  . methocarbamol (ROBAXIN) 500 MG tablet Take 1 tablet (500 mg total) by mouth 4 (four) times daily. For muscle spasm  . omeprazole (PRILOSEC) 40 MG capsule Take 40 mg by mouth daily.  . ondansetron (ZOFRAN) 4 MG tablet Take 4 mg by mouth as needed.   . predniSONE (DELTASONE) 50 MG tablet Take 1 tablet (50 mg total) by mouth daily.  . Rimegepant Sulfate  (NURTEC) 75 MG TBDP Take 75 mg by mouth as needed for up to 1 dose (Maximum 1 tablet in 24 hours.).  [name] tiZANidine (ZANAFLEX) 4 MG tablet Take 1 tablet (4 mg total) by mouth at bedtime.   No facility-administered encounter medications on file as of 07/08/2019.    Allergies: Allergies  Allergen Reactions  . Erythromycin Anaphylaxis and Nausea And Vomiting  . Nalbuphine Other (See Comments)    States loss of consciousness   . Cefdinir Diarrhea, Nausea And Vomiting and Rash  . Cephalexin Diarrhea and Nausea And Vomiting  . Cephalosporins Diarrhea and Nausea And Vomiting  . Hydrocodone-Acetaminophen Nausea And Vomiting  . Ketorolac Nausea And Vomiting  . Prochlorperazine Edisylate Other (See Comments)    uncontrollable shake - tremor   . Restasis [Cyclosporine] Other (See Comments)    migraines  . Tyloxapol Swelling  . Propranolol Diarrhea  . Topamax [Topiramate] Diarrhea and Nausea And Vomiting  . Zetia [Ezetimibe] Diarrhea  . Doxycycline Hyclate Nausea Only and Other (See Comments)    abd pain  . Iodinated Diagnostic Agents Other (See Comments)    Lightheaded, flushed feeling (explained to patient these are normal side effects of IV iodinated contrast, not allergies, but he wanted this noted in the epic; he tolerated epidural steroid injections w/ contrast w/o difficulty)  . Ketoconazole Itching  . Latex Itching  . Neosporin [Neomycin-Bacitracin Zn-Polymyx] Rash  . Oxycodone-Acetaminophen Other (See Comments)    Tylox caused constipation  . Prednisone Nausea And Vomiting    Oral route, only    Family History: Family History  Problem Relation Age of Onset  . Hypertension Mother   . Hypertension Brother   . Kidney  failure Maternal Grandmother   . Prostate cancer Neg Hx   . Kidney cancer Neg Hx     Social History: Social History   Socioeconomic History  . Marital status: Married    Spouse name: Not on file  . Number of children: 0  . Years of education: Not on file   . Highest education level: Bachelor's degree (e.g., BA, AB, BS)  Occupational History  . Not on file  Tobacco Use  . Smoking status: Never Smoker  . Smokeless tobacco: Never Used  Substance and Sexual Activity  . Alcohol use: No    Alcohol/week: 0.0 standard drinks  . Drug use: No  . Sexual activity: Yes    Partners: Female  Other Topics Concern  . Not on file  Social History Narrative   Pt is married lives with spouse in 1 story apartment he has No children   BS degree- he is right handed   Drinks soda  Ginger ale, no coffee no tea   Social Determinants of Radio broadcast assistant Strain:   . Difficulty of Paying Living Expenses: Not on file  Food Insecurity:   . Worried About Charity fundraiser in the Last Year: Not on file  . Ran Out of Food in the Last Year: Not on file  Transportation Needs:   . Lack of Transportation (Medical): Not on file  . Lack of Transportation (Non-Medical): Not on file  Physical Activity:   . Days of Exercise per Week: Not on file  . Minutes of Exercise per Session: Not on file  Stress:   . Feeling of Stress : Not on file  Social Connections:   . Frequency of Communication with Friends and Family: Not on file  . Frequency of Social Gatherings with Friends and Family: Not on file  . Attends Religious Services: Not on file  . Active Member of Clubs or Organizations: Not on file  . Attends Archivist Meetings: Not on file  . Marital Status: Not on file  Intimate Partner Violence:   . Fear of Current or Ex-Partner: Not on file  . Emotionally Abused: Not on file  . Physically Abused: Not on file  . Sexually Abused: Not on file    Observations/Objective:   Height 5\' 10"  (1.778 m), weight 160 lb (72.6 kg). No acute distress.  Alert and oriented.  Speech fluent and not dysarthric.  Language intact.  Eyes orthophoric on primary gaze.  Face symmetric.  Assessment and Plan:   Migraine without aura, without status migrainosus, not  intractable  1.  For preventative management, Emgality 2.  For abortive therapy, Nurtec 3.  Limit use of pain relievers to no more than 2 days out of week to prevent risk of rebound or medication-overuse headache. 4.  Keep headache diary 5.  Exercise, hydration, caffeine cessation, sleep hygiene, monitor for and avoid triggers 6. Follow up 6 months.   Follow Up Instructions:    -I discussed the assessment and treatment plan with the patient. The patient was provided an opportunity to ask questions and all were answered. The patient agreed with the plan and demonstrated an understanding of the instructions.   The patient was advised to call back or seek an in-person evaluation if the symptoms worsen or if the condition fails to improve as anticipated.   Dudley Major, DO

## 2019-07-08 ENCOUNTER — Telehealth (INDEPENDENT_AMBULATORY_CARE_PROVIDER_SITE_OTHER): Payer: PPO | Admitting: Neurology

## 2019-07-08 ENCOUNTER — Other Ambulatory Visit: Payer: Self-pay

## 2019-07-08 ENCOUNTER — Encounter: Payer: Self-pay | Admitting: Neurology

## 2019-07-08 VITALS — Ht 70.0 in | Wt 160.0 lb

## 2019-07-08 DIAGNOSIS — G43009 Migraine without aura, not intractable, without status migrainosus: Secondary | ICD-10-CM

## 2019-07-15 DIAGNOSIS — R5382 Chronic fatigue, unspecified: Secondary | ICD-10-CM | POA: Diagnosis not present

## 2019-07-15 DIAGNOSIS — G43719 Chronic migraine without aura, intractable, without status migrainosus: Secondary | ICD-10-CM | POA: Diagnosis not present

## 2019-07-15 DIAGNOSIS — F430 Acute stress reaction: Secondary | ICD-10-CM | POA: Diagnosis not present

## 2019-07-15 DIAGNOSIS — D519 Vitamin B12 deficiency anemia, unspecified: Secondary | ICD-10-CM | POA: Diagnosis not present

## 2019-07-15 DIAGNOSIS — M25511 Pain in right shoulder: Secondary | ICD-10-CM | POA: Diagnosis not present

## 2019-07-15 DIAGNOSIS — M25541 Pain in joints of right hand: Secondary | ICD-10-CM | POA: Diagnosis not present

## 2019-07-15 DIAGNOSIS — M79672 Pain in left foot: Secondary | ICD-10-CM | POA: Diagnosis not present

## 2019-07-15 DIAGNOSIS — J329 Chronic sinusitis, unspecified: Secondary | ICD-10-CM | POA: Diagnosis not present

## 2019-07-15 DIAGNOSIS — M5030 Other cervical disc degeneration, unspecified cervical region: Secondary | ICD-10-CM | POA: Diagnosis not present

## 2019-07-15 DIAGNOSIS — M79652 Pain in left thigh: Secondary | ICD-10-CM | POA: Diagnosis not present

## 2019-07-15 DIAGNOSIS — E559 Vitamin D deficiency, unspecified: Secondary | ICD-10-CM | POA: Diagnosis not present

## 2019-07-15 DIAGNOSIS — F4323 Adjustment disorder with mixed anxiety and depressed mood: Secondary | ICD-10-CM | POA: Diagnosis not present

## 2019-07-24 DIAGNOSIS — J329 Chronic sinusitis, unspecified: Secondary | ICD-10-CM | POA: Diagnosis not present

## 2019-07-24 DIAGNOSIS — E559 Vitamin D deficiency, unspecified: Secondary | ICD-10-CM | POA: Diagnosis not present

## 2019-07-24 DIAGNOSIS — R5382 Chronic fatigue, unspecified: Secondary | ICD-10-CM | POA: Diagnosis not present

## 2019-07-24 DIAGNOSIS — E785 Hyperlipidemia, unspecified: Secondary | ICD-10-CM | POA: Diagnosis not present

## 2019-07-24 DIAGNOSIS — D519 Vitamin B12 deficiency anemia, unspecified: Secondary | ICD-10-CM | POA: Diagnosis not present

## 2019-07-24 DIAGNOSIS — M5010 Cervical disc disorder with radiculopathy, unspecified cervical region: Secondary | ICD-10-CM | POA: Diagnosis not present

## 2019-07-24 DIAGNOSIS — R202 Paresthesia of skin: Secondary | ICD-10-CM | POA: Diagnosis not present

## 2019-07-24 DIAGNOSIS — G43719 Chronic migraine without aura, intractable, without status migrainosus: Secondary | ICD-10-CM | POA: Diagnosis not present

## 2019-07-31 DIAGNOSIS — G43719 Chronic migraine without aura, intractable, without status migrainosus: Secondary | ICD-10-CM | POA: Diagnosis not present

## 2019-07-31 DIAGNOSIS — J329 Chronic sinusitis, unspecified: Secondary | ICD-10-CM | POA: Diagnosis not present

## 2019-07-31 DIAGNOSIS — N50812 Left testicular pain: Secondary | ICD-10-CM | POA: Diagnosis not present

## 2019-07-31 DIAGNOSIS — M5010 Cervical disc disorder with radiculopathy, unspecified cervical region: Secondary | ICD-10-CM | POA: Diagnosis not present

## 2019-07-31 DIAGNOSIS — R109 Unspecified abdominal pain: Secondary | ICD-10-CM | POA: Diagnosis not present

## 2019-07-31 DIAGNOSIS — N5082 Scrotal pain: Secondary | ICD-10-CM | POA: Diagnosis not present

## 2019-07-31 DIAGNOSIS — R5382 Chronic fatigue, unspecified: Secondary | ICD-10-CM | POA: Diagnosis not present

## 2019-07-31 DIAGNOSIS — E559 Vitamin D deficiency, unspecified: Secondary | ICD-10-CM | POA: Diagnosis not present

## 2019-07-31 DIAGNOSIS — M21619 Bunion of unspecified foot: Secondary | ICD-10-CM | POA: Diagnosis not present

## 2019-07-31 DIAGNOSIS — D519 Vitamin B12 deficiency anemia, unspecified: Secondary | ICD-10-CM | POA: Diagnosis not present

## 2019-07-31 DIAGNOSIS — K3184 Gastroparesis: Secondary | ICD-10-CM | POA: Diagnosis not present

## 2019-07-31 DIAGNOSIS — M5415 Radiculopathy, thoracolumbar region: Secondary | ICD-10-CM | POA: Diagnosis not present

## 2019-08-20 DIAGNOSIS — R21 Rash and other nonspecific skin eruption: Secondary | ICD-10-CM | POA: Diagnosis not present

## 2019-08-20 DIAGNOSIS — M5415 Radiculopathy, thoracolumbar region: Secondary | ICD-10-CM | POA: Diagnosis not present

## 2019-08-20 DIAGNOSIS — J329 Chronic sinusitis, unspecified: Secondary | ICD-10-CM | POA: Diagnosis not present

## 2019-08-20 DIAGNOSIS — N50812 Left testicular pain: Secondary | ICD-10-CM | POA: Diagnosis not present

## 2019-08-20 DIAGNOSIS — M21619 Bunion of unspecified foot: Secondary | ICD-10-CM | POA: Diagnosis not present

## 2019-08-20 DIAGNOSIS — D519 Vitamin B12 deficiency anemia, unspecified: Secondary | ICD-10-CM | POA: Diagnosis not present

## 2019-08-20 DIAGNOSIS — G43719 Chronic migraine without aura, intractable, without status migrainosus: Secondary | ICD-10-CM | POA: Diagnosis not present

## 2019-08-20 DIAGNOSIS — K3184 Gastroparesis: Secondary | ICD-10-CM | POA: Diagnosis not present

## 2019-08-20 DIAGNOSIS — M5010 Cervical disc disorder with radiculopathy, unspecified cervical region: Secondary | ICD-10-CM | POA: Diagnosis not present

## 2019-08-20 DIAGNOSIS — R5382 Chronic fatigue, unspecified: Secondary | ICD-10-CM | POA: Diagnosis not present

## 2019-08-20 DIAGNOSIS — R109 Unspecified abdominal pain: Secondary | ICD-10-CM | POA: Diagnosis not present

## 2019-08-20 DIAGNOSIS — E559 Vitamin D deficiency, unspecified: Secondary | ICD-10-CM | POA: Diagnosis not present

## 2019-08-26 DIAGNOSIS — E559 Vitamin D deficiency, unspecified: Secondary | ICD-10-CM | POA: Diagnosis not present

## 2019-08-26 DIAGNOSIS — M79605 Pain in left leg: Secondary | ICD-10-CM | POA: Diagnosis not present

## 2019-08-26 DIAGNOSIS — D519 Vitamin B12 deficiency anemia, unspecified: Secondary | ICD-10-CM | POA: Diagnosis not present

## 2019-08-26 DIAGNOSIS — K3184 Gastroparesis: Secondary | ICD-10-CM | POA: Diagnosis not present

## 2019-08-26 DIAGNOSIS — M5010 Cervical disc disorder with radiculopathy, unspecified cervical region: Secondary | ICD-10-CM | POA: Diagnosis not present

## 2019-08-26 DIAGNOSIS — G43719 Chronic migraine without aura, intractable, without status migrainosus: Secondary | ICD-10-CM | POA: Diagnosis not present

## 2019-08-26 DIAGNOSIS — M21619 Bunion of unspecified foot: Secondary | ICD-10-CM | POA: Diagnosis not present

## 2019-08-26 DIAGNOSIS — M5415 Radiculopathy, thoracolumbar region: Secondary | ICD-10-CM | POA: Diagnosis not present

## 2019-08-26 DIAGNOSIS — M25512 Pain in left shoulder: Secondary | ICD-10-CM | POA: Diagnosis not present

## 2019-08-26 DIAGNOSIS — R21 Rash and other nonspecific skin eruption: Secondary | ICD-10-CM | POA: Diagnosis not present

## 2019-08-26 DIAGNOSIS — F430 Acute stress reaction: Secondary | ICD-10-CM | POA: Diagnosis not present

## 2019-08-26 DIAGNOSIS — G8929 Other chronic pain: Secondary | ICD-10-CM | POA: Diagnosis not present

## 2019-08-31 DIAGNOSIS — E559 Vitamin D deficiency, unspecified: Secondary | ICD-10-CM | POA: Diagnosis not present

## 2019-08-31 DIAGNOSIS — M5010 Cervical disc disorder with radiculopathy, unspecified cervical region: Secondary | ICD-10-CM | POA: Diagnosis not present

## 2019-08-31 DIAGNOSIS — R21 Rash and other nonspecific skin eruption: Secondary | ICD-10-CM | POA: Diagnosis not present

## 2019-08-31 DIAGNOSIS — R002 Palpitations: Secondary | ICD-10-CM | POA: Diagnosis not present

## 2019-08-31 DIAGNOSIS — F4323 Adjustment disorder with mixed anxiety and depressed mood: Secondary | ICD-10-CM | POA: Diagnosis not present

## 2019-08-31 DIAGNOSIS — J329 Chronic sinusitis, unspecified: Secondary | ICD-10-CM | POA: Diagnosis not present

## 2019-08-31 DIAGNOSIS — I471 Supraventricular tachycardia: Secondary | ICD-10-CM | POA: Diagnosis not present

## 2019-08-31 DIAGNOSIS — M6289 Other specified disorders of muscle: Secondary | ICD-10-CM | POA: Diagnosis not present

## 2019-08-31 DIAGNOSIS — F430 Acute stress reaction: Secondary | ICD-10-CM | POA: Diagnosis not present

## 2019-08-31 DIAGNOSIS — M5412 Radiculopathy, cervical region: Secondary | ICD-10-CM | POA: Diagnosis not present

## 2019-08-31 DIAGNOSIS — G43719 Chronic migraine without aura, intractable, without status migrainosus: Secondary | ICD-10-CM | POA: Diagnosis not present

## 2019-08-31 DIAGNOSIS — N50812 Left testicular pain: Secondary | ICD-10-CM | POA: Diagnosis not present

## 2019-08-31 DIAGNOSIS — R5382 Chronic fatigue, unspecified: Secondary | ICD-10-CM | POA: Diagnosis not present

## 2019-08-31 DIAGNOSIS — D519 Vitamin B12 deficiency anemia, unspecified: Secondary | ICD-10-CM | POA: Diagnosis not present

## 2019-08-31 DIAGNOSIS — R102 Pelvic and perineal pain: Secondary | ICD-10-CM | POA: Diagnosis not present

## 2019-08-31 DIAGNOSIS — K3184 Gastroparesis: Secondary | ICD-10-CM | POA: Diagnosis not present

## 2019-08-31 DIAGNOSIS — Z125 Encounter for screening for malignant neoplasm of prostate: Secondary | ICD-10-CM | POA: Diagnosis not present

## 2019-09-01 ENCOUNTER — Ambulatory Visit: Payer: PPO | Admitting: Physical Therapy

## 2019-09-09 DIAGNOSIS — N5082 Scrotal pain: Secondary | ICD-10-CM | POA: Diagnosis not present

## 2019-09-09 DIAGNOSIS — M6289 Other specified disorders of muscle: Secondary | ICD-10-CM | POA: Diagnosis not present

## 2019-09-09 DIAGNOSIS — E559 Vitamin D deficiency, unspecified: Secondary | ICD-10-CM | POA: Diagnosis not present

## 2019-09-09 DIAGNOSIS — R102 Pelvic and perineal pain: Secondary | ICD-10-CM | POA: Diagnosis not present

## 2019-09-09 DIAGNOSIS — I471 Supraventricular tachycardia: Secondary | ICD-10-CM | POA: Diagnosis not present

## 2019-09-09 DIAGNOSIS — K3184 Gastroparesis: Secondary | ICD-10-CM | POA: Diagnosis not present

## 2019-09-09 DIAGNOSIS — G8929 Other chronic pain: Secondary | ICD-10-CM | POA: Diagnosis not present

## 2019-09-09 DIAGNOSIS — N50812 Left testicular pain: Secondary | ICD-10-CM | POA: Diagnosis not present

## 2019-09-09 DIAGNOSIS — R800 Isolated proteinuria: Secondary | ICD-10-CM | POA: Diagnosis not present

## 2019-09-09 DIAGNOSIS — D519 Vitamin B12 deficiency anemia, unspecified: Secondary | ICD-10-CM | POA: Diagnosis not present

## 2019-09-09 DIAGNOSIS — G43719 Chronic migraine without aura, intractable, without status migrainosus: Secondary | ICD-10-CM | POA: Diagnosis not present

## 2019-09-09 DIAGNOSIS — R002 Palpitations: Secondary | ICD-10-CM | POA: Diagnosis not present

## 2019-09-21 DIAGNOSIS — I471 Supraventricular tachycardia: Secondary | ICD-10-CM | POA: Diagnosis not present

## 2019-09-21 DIAGNOSIS — G43719 Chronic migraine without aura, intractable, without status migrainosus: Secondary | ICD-10-CM | POA: Diagnosis not present

## 2019-09-21 DIAGNOSIS — D649 Anemia, unspecified: Secondary | ICD-10-CM | POA: Diagnosis not present

## 2019-09-21 DIAGNOSIS — N182 Chronic kidney disease, stage 2 (mild): Secondary | ICD-10-CM | POA: Diagnosis not present

## 2019-09-21 DIAGNOSIS — N2581 Secondary hyperparathyroidism of renal origin: Secondary | ICD-10-CM | POA: Diagnosis not present

## 2019-09-25 ENCOUNTER — Encounter: Payer: Self-pay | Admitting: Physical Therapy

## 2019-09-25 ENCOUNTER — Ambulatory Visit: Payer: PPO | Attending: Family Medicine | Admitting: Physical Therapy

## 2019-09-25 ENCOUNTER — Other Ambulatory Visit: Payer: Self-pay

## 2019-09-25 DIAGNOSIS — M5442 Lumbago with sciatica, left side: Secondary | ICD-10-CM | POA: Insufficient documentation

## 2019-09-25 DIAGNOSIS — G8929 Other chronic pain: Secondary | ICD-10-CM | POA: Insufficient documentation

## 2019-09-25 DIAGNOSIS — M6281 Muscle weakness (generalized): Secondary | ICD-10-CM | POA: Diagnosis not present

## 2019-09-25 NOTE — Therapy (Signed)
[facility] [address], [place], [name], [address] Phone: [phone]   Fax:  [phone]  Physical Therapy Evaluation  Patient Details  Name: William [name] MRN: [id number] Date of Birth: 1960/11/26 Referring Provider (PT): Dr. [name]   Encounter Date: 09/25/2019  PT End of Session - 09/25/19 1316    Visit Number  1    Date for PT Re-Evaluation  11/20/19    Authorization Type  [employer]    PT Start Time  0845    PT Stop Time  0926    PT Time Calculation (min)  41 min    Activity Tolerance  Patient tolerated treatment well       Past Medical History:  Diagnosis Date  . Allergy   . Anxiety   . Depression   . Gastroparesis   . Heart murmur   . Migraine headache   . Mitral valve prolapse   . SVT (supraventricular tachycardia) ([place])     History reviewed. No pertinent surgical history.  There were no vitals filed for this visit.   Subjective Assessment - 09/25/19 0851    Subjective  In January, I had to catch my 70 year old mother from falling.  New onset tingling/numbness in left thigh to bottom left foot.  Has LBP left side 2 years with injections.      Right UE numbness/tingling.  Not sleeping well b/c mother calls him at night.    Pertinent History  history of LBP;  anxiety,depression, migraines;  previous pt of BF [name], [place])    Limitations  Sitting;Standing;House hold activities    How long can you sit comfortably?  1-2 hours    How long can you walk comfortably?  1 1/2 hours    Diagnostic tests  MRI "arthritis" and "pinching in neck"   per MEDICAL RECORD NUMBER stenosis L3-4, L4-5 and L5-S1    Patient Stated Goals  relieve pain, get stronger; return to strengthening    Currently in Pain?  Yes    Pain Score  4     Pain Location  Leg    Pain Orientation  Left    Pain Type  Chronic pain    Pain Frequency  Constant    Aggravating Factors   sitting,  staying still, riding in the car,  lifting    Pain Relieving Factors  walking         [facility] PT Assessment - 09/25/19 0001      Assessment   Medical Diagnosis  left sciatica    Referring Provider (PT)  Dr. [name]    Onset Date/Surgical Date  --   January    Next MD Visit  as needed    Prior Therapy  December 2019      Precautions   Precautions  None      Restrictions   Weight Bearing Restrictions  No    Other Position/Activity Restrictions  No lift > 25#       Balance Screen   Has the patient fallen in the past 6 months  No    Has the patient had a decrease in activity level because of a fear of falling?   No    Is the patient reluctant to leave their home because of a fear of falling?   No      Home Film/video editor residence    Living Arrangements  Spouse/significant other;Other relatives    Type of  Home  House    Home Access  Level entry      Prior Function   Leisure  currently mother lives with him       Observation/Other Assessments   Focus on Therapeutic Outcomes (FOTO)   45% limitation      Posture/Postural Control   Posture/Postural Control  Postural limitations    Postural Limitations  Decreased lumbar lordosis    Posture Comments  in sitting: very slouched posture       AROM   AROM Assessment Site  --   no clear directional preference;no worse/no better extension   Lumbar Flexion  40    Lumbar Extension  30    Lumbar - Right Side Bend  30    Lumbar - Left Side Bend  30      Strength   Strength Assessment Site  --   left knee and ankle MMT 5/5   Right Hip Flexion  5/5    Right Hip Extension  5/5    Right Hip ABduction  4/5    Left Hip Flexion  5/5    Lumbar Flexion  4/5    Lumbar Extension  4/5      Flexibility   Soft Tissue Assessment /Muscle Length  yes    Hamstrings  bil 60 degrees     Quadriceps  decreased bil       Slump test   Findings  Negative      Prone Knee Bend Test   Findings  Negative      Straight Leg Raise   Findings  Negative                   Objective measurements completed on examination: See above findings.              PT Education - 09/25/19 1315    Education Details  [email or web address]   trial of prone press ups; lumbar roll;  get up from sitting every 30 minutes    Person(s) Educated  Patient    Methods  Explanation;Demonstration;Handout    Comprehension  Returned demonstration;Verbalized understanding       PT Short Term Goals - 09/25/19 1335      PT SHORT TERM GOAL #1   Title  Pt will be independent with his initial HEP to improve ROM, strength and body mechanics.     Time  4    Period  Weeks    Status  New    Target Date  10/23/19      PT SHORT TERM GOAL #2   Title  Pt will demo improved posture with sitting and consistent use of lumbar roll for additional back support throughout the day.     Time  4    Period  Weeks    Status  New      PT SHORT TERM GOAL #3   Title  The patient will report decreased left LE numbness/tingling by 40% with usual ADLs    Time  4    Period  Weeks    Status  Achieved      PT SHORT TERM GOAL #4   Title  Pt will demo good understanding of proper posture and mechanics with lifting a small object (less than 5 lb) from the floor, 2/3 trials without PT cuing.     Time  4    Period  Weeks    Status  New      PT SHORT TERM GOAL #5  Title  Lumbar flexion improved to 50 degrees, extension to 35 degrees and sidebending to 35 degrees to aid in mobility needed as caregiver for his mother    Time  4    Period  Weeks    Status  New        PT Long Term Goals - 09/25/19 1336      PT LONG TERM GOAL #1   Title  Pt will be independent with his advanced HEP to improve flexibility, strength and mechanics with daily activity after d/c from PT.     Time  8    Period  Weeks    Status  New    Target Date  11/20/19      PT LONG TERM GOAL #2   Title  Pt will report  at least 60% improvement in back pain and left LE numbness/tingling    Time  8    Period   Weeks    Status  New      PT LONG TERM GOAL #3   Title  Pt will report being able to sit for at least 30 minutes with pain level 2/10, which will allow him to drive around town without limitation.    Time  8    Period  Weeks    Status  New      PT LONG TERM GOAL #4   Title  Pt will have 4+/5 trunk and left hip strength to be able to lift 10 lb box from the floor with proper mechanics and without increase in hip/back pain, 2/3 trials.    Time  8    Period  Weeks    Status  New      PT LONG TERM GOAL #5   Title  FOTO functional outcome score improved from 45% limitation to 33%    Time  8    Period  Weeks    Status  New             Plan - 09/25/19 1318    Clinical Impression Statement  The patient has a history of spinal pain however he has the new onset of left lateral thigh, leg and foot numbness/tingling since January after catching his elderly mother who was falling.  He also has right UE numbness/tingling however he is referred only for left LE sciatica at this time.  He reports his pain is worsened with sitting, riding in the car, being still and he avoids lifting.  He reports he is better with movement including walking.  Although his MRI findings indicate multi level lumbar stenosis, his subjective reports indicate more of a possible extension preference.  His lumbar flexion is limited to 40 degrees.  Repeated extension in lying no better, no worse.  Poor sitting posture and decreased lumbar lordosis in standing.  No LE weakness except left hip abduction 4/5.  Negative SLR, slump and prone knee bend neural signs.  He would benefit from PT for centralization techniques, pain control and recovery of ROM and strength needed for ADLs.    Personal Factors and Comorbidities  Comorbidity 1;Comorbidity 2;Comorbidity 3+;Past/Current Experience    Comorbidities  chronic history spinal pain, anxiety, depression, migraines    Examination-Activity Limitations  Sit;Lift;Caring for Others     Examination-Participation Restrictions  Driving;Interpersonal Relationship    Stability/Clinical Decision Making  Stable/Uncomplicated    Clinical Decision Making  Low    Rehab Potential  Good    PT Frequency  2x / week    PT  Duration  8 weeks    PT Treatment/Interventions  ADLs/Self Care Home Management;Electrical Stimulation;Cryotherapy;Aquatic Therapy;Moist Heat;Traction;Ultrasound;Neuromuscular re-education;Therapeutic exercise;Therapeutic activities;Patient/family education;Manual techniques;Dry needling;Taping;Spinal Manipulations    PT Next Visit Plan  assess response to trial of prone press ups (partial range) for centralization of left LE symptoms;  continue assessment for lumbar directional preference and progress;  if no directional bias will consider traction;  manual techniques; exercise    Recommended Other Services  may need referral for right UE numbness/tingling in the future    Consulted and Agree with Plan of Care  Patient       Patient will benefit from skilled therapeutic intervention in order to improve the following deficits and impairments:  Decreased range of motion, Pain, Impaired perceived functional ability, Decreased strength, Postural dysfunction  Visit Diagnosis: Chronic left-sided low back pain with left-sided sciatica - Plan: PT plan of care cert/re-cert  Muscle weakness (generalized) - Plan: PT plan of care cert/re-cert     Problem List Patient Active Problem List   Diagnosis Date Noted  . Degenerative cervical disc 05/07/2019  . Left lumbar radiculopathy 10/10/2017  . Migraine without aura and without status migrainosus, not intractable 06/22/2017  . Vitamin D deficiency 05/28/2017  . Scrotal pain 05/28/2017  . Paroxysmal SVT (supraventricular tachycardia) ([place]) 05/28/2017  . Mild left inguinal hernia 11/23/2016  . Chronic pelvic pain in male 09/28/2016  . Abdominal pain, left lower quadrant 09/28/2016  . Isolated proteinuria without specific  morphologic lesion 06/27/2016  . Left testicular pain 06/27/2016  . Acute left flank pain 06/27/2016  . Epididymitis, left 06/05/2016  . Pelvic floor dysfunction 04/01/2016  . GERD (gastroesophageal reflux disease) 10/13/2015  . Gastroparesis 10/13/2015  . Intractable chronic migraine without aura and without status migrainosus 12/15/2013  . Hyperlipidemia 04/04/2012  . Mitral valve disease 07/15/2009  . IRRITABLE BOWEL SYNDROME 07/15/2009  . Palpitations 06/28/2009   Ruben Im, PT 09/25/19 1:51 PM Phone: [phone] Fax: [phone] Alvera Singh 09/25/2019, 1:49 PM  [facility] [address] [place], [name], [address] Phone: [phone]   Fax:  [phone]  Name: William [name] MRN: [id number] Date of Birth: 12-22-1960

## 2019-09-25 NOTE — Patient Instructions (Addendum)
Access Code: [email or web address] URL: [email or web address] Date: 09/25/2019 Prepared by: Ruben Im  Exercises Prone Press Up - 3-4 x daily - 7 x weekly - 1 sets - 10 reps  Patient Education Posture and Body Mechanics  Sitting with lumbar roll, frequent breaks from sitting every 30 minutes;  Continue walking

## 2019-09-30 ENCOUNTER — Encounter: Payer: Self-pay | Admitting: Physical Therapy

## 2019-09-30 ENCOUNTER — Other Ambulatory Visit: Payer: Self-pay

## 2019-09-30 ENCOUNTER — Ambulatory Visit: Payer: PPO | Attending: Family Medicine | Admitting: Physical Therapy

## 2019-09-30 DIAGNOSIS — M62838 Other muscle spasm: Secondary | ICD-10-CM | POA: Insufficient documentation

## 2019-09-30 DIAGNOSIS — R293 Abnormal posture: Secondary | ICD-10-CM | POA: Diagnosis not present

## 2019-09-30 DIAGNOSIS — M25552 Pain in left hip: Secondary | ICD-10-CM | POA: Insufficient documentation

## 2019-09-30 DIAGNOSIS — M5442 Lumbago with sciatica, left side: Secondary | ICD-10-CM | POA: Diagnosis not present

## 2019-09-30 DIAGNOSIS — M6281 Muscle weakness (generalized): Secondary | ICD-10-CM | POA: Diagnosis not present

## 2019-09-30 DIAGNOSIS — G8929 Other chronic pain: Secondary | ICD-10-CM | POA: Insufficient documentation

## 2019-09-30 NOTE — Therapy (Signed)
[facility] [address], [place], [name], [address] Phone: [phone]   Fax:  [phone]  Physical Therapy Treatment  Patient Details  Name: William [name] MRN: [id number] Date of Birth: 1961/02/26 Referring Provider (PT): Dr. [name]   Encounter Date: 09/30/2019  PT End of Session - 09/30/19 0954    Visit Number  2    Date for PT Re-Evaluation  11/20/19    Authorization Type  [employer]    PT Start Time  0932    PT Stop Time  1010    PT Time Calculation (min)  38 min    Activity Tolerance  Patient tolerated treatment well;No increased pain    Behavior During Therapy  WFL for tasks assessed/performed       Past Medical History:  Diagnosis Date  . Allergy   . Anxiety   . Depression   . Gastroparesis   . Heart murmur   . Migraine headache   . Mitral valve prolapse   . SVT (supraventricular tachycardia) ([place])     History reviewed. No pertinent surgical history.  There were no vitals filed for this visit.  Subjective Assessment - 09/30/19 0933    Subjective  Pt states that things are going well. He has symptoms constantly.    Pertinent History  history of LBP;  anxiety,depression, migraines;  previous pt of BF [name], [place])    Limitations  Sitting;Standing;House hold activities    How long can you sit comfortably?  1-2 hours    How long can you walk comfortably?  1 1/2 hours    Diagnostic tests  MRI "arthritis" and "pinching in neck"   per MEDICAL RECORD NUMBER stenosis L3-4, L4-5 and L5-S1    Patient Stated Goals  relieve pain, get stronger; return to strengthening    Currently in Pain?  No/denies   just tingling                       [facility] Adult PT Treatment/Exercise - 09/30/19 0001      Exercises   Exercises  Lumbar      Lumbar Exercises: Supine   Other Supine Lumbar Exercises  Lt piriformis nerve flossing in figure 4 x15 reps     Other Supine Lumbar Exercises  Lt  sciatic nerve floss x15 reps       Lumbar Exercises: Prone   Straight Leg Raise  10 reps    Straight Leg Raises Limitations  x2 sets     Other Prone Lumbar Exercises  prone press up on hands x50 reps pain/tingling centralizing and alleviated; x10 reps PT overpressure      Self care: handouts and review of safe lifting, posture throughout the day       PT Education - 09/30/19 0951    Education Details  implications for directional preference; technique with therex    Person(s) Educated  Patient    Methods  Explanation    Comprehension  Verbalized understanding       PT Short Term Goals - 09/25/19 1335      PT SHORT TERM GOAL #1   Title  Pt will be independent with his initial HEP to improve ROM, strength and body mechanics.     Time  4    Period  Weeks    Status  New    Target Date  10/23/19      PT SHORT TERM GOAL #2   Title  Pt will  demo improved posture with sitting and consistent use of lumbar roll for additional back support throughout the day.     Time  4    Period  Weeks    Status  New      PT SHORT TERM GOAL #3   Title  The patient will report decreased left LE numbness/tingling by 40% with usual ADLs    Time  4    Period  Weeks    Status  Achieved      PT SHORT TERM GOAL #4   Title  Pt will demo good understanding of proper posture and mechanics with lifting a small object (less than 5 lb) from the floor, 2/3 trials without PT cuing.     Time  4    Period  Weeks    Status  New      PT SHORT TERM GOAL #5   Title  Lumbar flexion improved to 50 degrees, extension to 35 degrees and sidebending to 35 degrees to aid in mobility needed as caregiver for his mother    Time  4    Period  Weeks    Status  New        PT Long Term Goals - 09/25/19 1336      PT LONG TERM GOAL #1   Title  Pt will be independent with his advanced HEP to improve flexibility, strength and mechanics with daily activity after d/c from PT.     Time  8    Period  Weeks    Status   New    Target Date  11/20/19      PT LONG TERM GOAL #2   Title  Pt will report  at least 60% improvement in back pain and left LE numbness/tingling    Time  8    Period  Weeks    Status  New      PT LONG TERM GOAL #3   Title  Pt will report being able to sit for at least 30 minutes with pain level 2/10, which will allow him to drive around town without limitation.    Time  8    Period  Weeks    Status  New      PT LONG TERM GOAL #4   Title  Pt will have 4+/5 trunk and left hip strength to be able to lift 10 lb box from the floor with proper mechanics and without increase in hip/back pain, 2/3 trials.    Time  8    Period  Weeks    Status  New      PT LONG TERM GOAL #5   Title  FOTO functional outcome score improved from 45% limitation to 33%    Time  8    Period  Weeks    Status  New            Plan - 09/30/19 1002    Clinical Impression Statement  Pt arrived with symptoms of Lt LE tingling down to the foot. He reported no change in symptoms with HEP. PT reviewed pressups and made some adjustments. After 50-60 repetitions, pt's pain and symptoms fully resolved in the Lt leg. Pt was educated on avoiding excessive flexion over the next several days and verbalized understanding of this. Will continue with current POC moving forward.    Personal Factors and Comorbidities  Comorbidity 1;Comorbidity 2;Comorbidity 3+;Past/Current Experience    Comorbidities  chronic history spinal pain, anxiety, depression, migraines    Examination-Activity  Limitations  Sit;Lift;Caring for Others    Examination-Participation Restrictions  Driving;Interpersonal Relationship    Stability/Clinical Decision Making  Stable/Uncomplicated    Rehab Potential  Good    PT Frequency  2x / week    PT Duration  8 weeks    PT Treatment/Interventions  ADLs/Self Care Home Management;Electrical Stimulation;Cryotherapy;Aquatic Therapy;Moist Heat;Traction;Ultrasound;Neuromuscular re-education;Therapeutic  exercise;Therapeutic activities;Patient/family education;Manual techniques;Dry needling;Taping;Spinal Manipulations    PT Next Visit Plan  progress press ups to standing if able; mechanics and trunk strength;  manual techniques    Consulted and Agree with Plan of Care  Patient       Patient will benefit from skilled therapeutic intervention in order to improve the following deficits and impairments:  Decreased range of motion, Pain, Impaired perceived functional ability, Decreased strength, Postural dysfunction  Visit Diagnosis: Chronic left-sided low back pain with left-sided sciatica  Muscle weakness (generalized)  Other muscle spasm  Pain in left hip  Abnormal posture     Problem List Patient Active Problem List   Diagnosis Date Noted  . Degenerative cervical disc 05/07/2019  . Left lumbar radiculopathy 10/10/2017  . Migraine without aura and without status migrainosus, not intractable 06/22/2017  . Vitamin D deficiency 05/28/2017  . Scrotal pain 05/28/2017  . Paroxysmal SVT (supraventricular tachycardia) ([place]) 05/28/2017  . Mild left inguinal hernia 11/23/2016  . Chronic pelvic pain in male 09/28/2016  . Abdominal pain, left lower quadrant 09/28/2016  . Isolated proteinuria without specific morphologic lesion 06/27/2016  . Left testicular pain 06/27/2016  . Acute left flank pain 06/27/2016  . Epididymitis, left 06/05/2016  . Pelvic floor dysfunction 04/01/2016  . GERD (gastroesophageal reflux disease) 10/13/2015  . Gastroparesis 10/13/2015  . Intractable chronic migraine without aura and without status migrainosus 12/15/2013  . Hyperlipidemia 04/04/2012  . Mitral valve disease 07/15/2009  . IRRITABLE BOWEL SYNDROME 07/15/2009  . Palpitations 06/28/2009    10:11 AM,09/30/19 Sherol Dade PT, DPT [facility]  [facility] [address], [place], [name], [address] Phone: [phone]   Fax:  [phone]  Name: William [name] MRN: [id number] Date of Birth: 01-12-1961

## 2019-09-30 NOTE — Patient Instructions (Signed)
Access Code: [email or web address]: [email or web address]: 06/02/2021Prepared by: [place]  Prone Press Up - 8-10 x daily - 7 x weekly - 1 sets - 10 reps  Prone Hip Extension - 1 x daily - 7 x weekly - 10 reps - 3 sets Patient Education  [facility] [address] [place], [place] [address] Phone # [phone] Fax [phone]

## 2019-10-06 ENCOUNTER — Encounter: Payer: PPO | Admitting: Physical Therapy

## 2019-10-08 ENCOUNTER — Encounter: Payer: PPO | Admitting: Physical Therapy

## 2019-10-15 ENCOUNTER — Other Ambulatory Visit: Payer: Self-pay

## 2019-10-15 ENCOUNTER — Ambulatory Visit: Payer: PPO | Admitting: Physical Therapy

## 2019-10-15 ENCOUNTER — Encounter: Payer: Self-pay | Admitting: Physical Therapy

## 2019-10-15 DIAGNOSIS — M25552 Pain in left hip: Secondary | ICD-10-CM

## 2019-10-15 DIAGNOSIS — M6281 Muscle weakness (generalized): Secondary | ICD-10-CM

## 2019-10-15 DIAGNOSIS — G8929 Other chronic pain: Secondary | ICD-10-CM

## 2019-10-15 DIAGNOSIS — M5442 Lumbago with sciatica, left side: Secondary | ICD-10-CM | POA: Diagnosis not present

## 2019-10-15 DIAGNOSIS — M62838 Other muscle spasm: Secondary | ICD-10-CM

## 2019-10-15 DIAGNOSIS — R293 Abnormal posture: Secondary | ICD-10-CM

## 2019-10-15 NOTE — Patient Instructions (Signed)
Access Code: [email or web address]: [email or web address]: 06/17/2021Prepared by: [place] [facility]field[facility]ercises  Prone Hip Extension - 1 x daily - 7 x weekly - 10 reps - 3 sets  Standing Lumbar Extension - 4 x daily - 7 x weekly - 1 sets - 10 reps  Squat - 1 x daily - 7 x weekly - 2 sets - 5 reps Patient Education  [facility] [address], [place], [place] [address] Phone # [phone] Fax [phone]

## 2019-10-15 NOTE — Therapy (Signed)
[facility] [address], [place], [name], [address] Phone: [phone]   Fax:  [phone]  Physical Therapy Treatment  Patient Details  Name: William [name] MRN: [id number] Date of Birth: 07-Sep-1960 Referring Provider (PT): Dr. [name]   Encounter Date: 10/15/2019   PT End of Session - 10/15/19 0912    Visit Number 3    Date for PT Re-Evaluation 11/20/19    Authorization Type [employer]    PT Start Time 0845    PT Stop Time 0927    PT Time Calculation (min) 42 min    Activity Tolerance Patient tolerated treatment well;No increased pain    Behavior During Therapy WFL for tasks assessed/performed           Past Medical History:  Diagnosis Date  . Allergy   . Anxiety   . Depression   . Gastroparesis   . Heart murmur   . Migraine headache   . Mitral valve prolapse   . SVT (supraventricular tachycardia) ([place])     History reviewed. No pertinent surgical history.  There were no vitals filed for this visit.   Subjective Assessment - 10/15/19 0849    Subjective Pt states that he started having his "cluster headaches". He has not done his pressups since last Tuesday. He was hardly even noticing his Lt LE pain until recently. Now it is intermittent.    Pertinent History history of LBP;  anxiety,depression, migraines;  previous pt of BF [name], [place])    Limitations Sitting;Standing;House hold activities    How long can you sit comfortably? 1-2 hours    How long can you walk comfortably? 1 1/2 hours    Diagnostic tests MRI "arthritis" and "pinching in neck"   per MEDICAL RECORD NUMBER stenosis L3-4, L4-5 and L5-S1    Patient Stated Goals relieve pain, get stronger; return to strengthening    Currently in Pain? No/denies                             [facility] Adult PT Treatment/Exercise - 10/15/19 0001      Self-Care   Self-Care Posture;Lifting    Lifting lifting without increased  trunk flexion-encouraging knees bent: x10 reps PT cuing     Posture avoiding slouching and excessive lumbar flexion throughout the day      Therapeutic Activites    Therapeutic Activities Lifting    Lifting lifting simulation holding weighted blue ball x15 reps- PT cuing to avoid excessive trunk flexion; single arm reach to 6" box x5 reps using 1 UE thigh support       Lumbar Exercises: Standing   Other Standing Lumbar Exercises repeated extension in standing x10 reps     Other Standing Lumbar Exercises lat pulldown x15 reps #20       Lumbar Exercises: Supine   Ab Set 3 seconds;15 reps    AB Set Limitations heavy PT cuing to decrease doming     Isometric Hip Flexion 10 reps;3 seconds                  PT Education - 10/15/19 0926    Education Details lifting technique    Person(s) Educated Patient    Methods Verbal cues;Explanation;Handout    Comprehension Verbalized understanding;Returned demonstration            PT Short Term Goals - 09/25/19 1335      PT SHORT TERM GOAL #1  Title Pt will be independent with his initial HEP to improve ROM, strength and body mechanics.     Time 4    Period Weeks    Status New    Target Date 10/23/19      PT SHORT TERM GOAL #2   Title Pt will demo improved posture with sitting and consistent use of lumbar roll for additional back support throughout the day.     Time 4    Period Weeks    Status New      PT SHORT TERM GOAL #3   Title The patient will report decreased left LE numbness/tingling by 40% with usual ADLs    Time 4    Period Weeks    Status Achieved      PT SHORT TERM GOAL #4   Title Pt will demo good understanding of proper posture and mechanics with lifting a small object (less than 5 lb) from the floor, 2/3 trials without PT cuing.     Time 4    Period Weeks    Status New      PT SHORT TERM GOAL #5   Title Lumbar flexion improved to 50 degrees, extension to 35 degrees and sidebending to 35 degrees to aid in  mobility needed as caregiver for his mother    Time 4    Period Weeks    Status New             PT Long Term Goals - 09/25/19 1336      PT LONG TERM GOAL #1   Title Pt will be independent with his advanced HEP to improve flexibility, strength and mechanics with daily activity after d/c from PT.     Time 8    Period Weeks    Status New    Target Date 11/20/19      PT LONG TERM GOAL #2   Title Pt will report  at least 60% improvement in back pain and left LE numbness/tingling    Time 8    Period Weeks    Status New      PT LONG TERM GOAL #3   Title Pt will report being able to sit for at least 30 minutes with pain level 2/10, which will allow him to drive around town without limitation.    Time 8    Period Weeks    Status New      PT LONG TERM GOAL #4   Title Pt will have 4+/5 trunk and left hip strength to be able to lift 10 lb box from the floor with proper mechanics and without increase in hip/back pain, 2/3 trials.    Time 8    Period Weeks    Status New      PT LONG TERM GOAL #5   Title FOTO functional outcome score improved from 45% limitation to 33%    Time 8    Period Weeks    Status New                 Plan - 10/15/19 5809    Clinical Impression Statement Pt has done well since his last session, reporting several days of no symptoms. He had recurrence of migraines that made it difficult to keep up with his HEP. He noticed intermittent LE symptoms throughout the last couple of days. PT educated pt on the importance of avoiding excessive lumbar flexion and reviewed proper posture with lifting and reaching to the floor for practice throughout the day.  Pt was able to demonstrate good understanding of this following PT cuing. He had difficulty with transverse abdominus activation despite PT cuing. Will continue with current POC.    Personal Factors and Comorbidities Comorbidity 1;Comorbidity 2;Comorbidity 3+;Past/Current Experience    Comorbidities chronic  history spinal pain, anxiety, depression, migraines    Examination-Activity Limitations Sit;Lift;Caring for Others    Examination-Participation Restrictions Driving;Interpersonal Relationship    Stability/Clinical Decision Making Stable/Uncomplicated    Rehab Potential Good    PT Frequency 2x / week    PT Duration 8 weeks    PT Treatment/Interventions ADLs/Self Care Home Management;Electrical Stimulation;Cryotherapy;Aquatic Therapy;Moist Heat;Traction;Ultrasound;Neuromuscular re-education;Therapeutic exercise;Therapeutic activities;Patient/family education;Manual techniques;Dry needling;Taping;Spinal Manipulations    PT Next Visit Plan f/u on repeated extension in standing; mechanics and trunk strength;  manual techniques    Consulted and Agree with Plan of Care Patient           Patient will benefit from skilled therapeutic intervention in order to improve the following deficits and impairments:  Decreased range of motion, Pain, Impaired perceived functional ability, Decreased strength, Postural dysfunction  Visit Diagnosis: Chronic left-sided low back pain with left-sided sciatica  Muscle weakness (generalized)  Other muscle spasm  Pain in left hip  Abnormal posture     Problem List Patient Active Problem List   Diagnosis Date Noted  . Degenerative cervical disc 05/07/2019  . Left lumbar radiculopathy 10/10/2017  . Migraine without aura and without status migrainosus, not intractable 06/22/2017  . Vitamin D deficiency 05/28/2017  . Scrotal pain 05/28/2017  . Paroxysmal SVT (supraventricular tachycardia) ([place]) 05/28/2017  . Mild left inguinal hernia 11/23/2016  . Chronic pelvic pain in male 09/28/2016  . Abdominal pain, left lower quadrant 09/28/2016  . Isolated proteinuria without specific morphologic lesion 06/27/2016  . Left testicular pain 06/27/2016  . Acute left flank pain 06/27/2016  . Epididymitis, left 06/05/2016  . Pelvic floor dysfunction 04/01/2016  . GERD  (gastroesophageal reflux disease) 10/13/2015  . Gastroparesis 10/13/2015  . Intractable chronic migraine without aura and without status migrainosus 12/15/2013  . Hyperlipidemia 04/04/2012  . Mitral valve disease 07/15/2009  . IRRITABLE BOWEL SYNDROME 07/15/2009  . Palpitations 06/28/2009    9:31 AM,10/15/19 Sherol Dade PT, DPT [facility] [address], [place], [name], [address] Phone: [phone]   Fax:  [phone]  Name: Bing Duffey MRN: [id number] Date of Birth: 07/18/1960

## 2019-10-20 ENCOUNTER — Ambulatory Visit: Payer: PPO | Admitting: Physical Therapy

## 2019-10-20 ENCOUNTER — Other Ambulatory Visit: Payer: Self-pay

## 2019-10-20 ENCOUNTER — Encounter: Payer: Self-pay | Admitting: Physical Therapy

## 2019-10-20 DIAGNOSIS — M5442 Lumbago with sciatica, left side: Secondary | ICD-10-CM | POA: Diagnosis not present

## 2019-10-20 DIAGNOSIS — M62838 Other muscle spasm: Secondary | ICD-10-CM

## 2019-10-20 DIAGNOSIS — M6281 Muscle weakness (generalized): Secondary | ICD-10-CM

## 2019-10-20 DIAGNOSIS — G8929 Other chronic pain: Secondary | ICD-10-CM

## 2019-10-20 NOTE — Therapy (Signed)
[facility] [address] [place], [name], [address] Phone: [phone]   Fax:  [phone]  Physical Therapy Treatment  Patient Details  Name: William [name] MRN: [id number] Date of Birth: 04-11-1961 Referring Provider (PT): Dr. [name]   Encounter Date: 10/20/2019   PT End of Session - 10/20/19 1336    Visit Number 4    Date for PT Re-Evaluation 11/20/19    Authorization Type [employer]    PT Start Time 1147    PT Stop Time 1229    PT Time Calculation (min) 42 min    Activity Tolerance Patient tolerated treatment well;No increased pain    Behavior During Therapy WFL for tasks assessed/performed           Past Medical History:  Diagnosis Date  . Allergy   . Anxiety   . Depression   . Gastroparesis   . Heart murmur   . Migraine headache   . Mitral valve prolapse   . SVT (supraventricular tachycardia) ([place])     History reviewed. No pertinent surgical history.  There were no vitals filed for this visit.   Subjective Assessment - 10/20/19 1335    Subjective Pt states things are going well. He has some symptoms behind the Lt knee but his primary issue is the Rt shoulder. He is going to her referring physician tomorrow.    Pertinent History history of LBP;  anxiety,depression, migraines;  previous pt of BF [name], [place])    Limitations Sitting;Standing;House hold activities    How long can you sit comfortably? 1-2 hours    How long can you walk comfortably? 1 1/2 hours    Diagnostic tests MRI "arthritis" and "pinching in neck"   per MEDICAL RECORD NUMBER stenosis L3-4, L4-5 and L5-S1    Patient Stated Goals relieve pain, get stronger; return to strengthening    Currently in Pain? No/denies                             [facility] Adult PT Treatment/Exercise - 10/20/19 0001      Lumbar Exercises: Standing   Shoulder Extension Strengthening;Both;10 reps    Theraband Level (Shoulder  Extension) Level 3 (Green)    Shoulder Extension Limitations x3 sets      Lumbar Exercises: Seated   Other Seated Lumbar Exercises abdominal bracing with UE support on chair and hip flexion x10 reps, without UE support x10 reps, seated on dyna disc x10 reps (increased difficulty with maintaining upright trunk)     Other Seated Lumbar Exercises abdominal brace with blue TB row 2x15 reps           Seated: rows with abdominal brace blue TB 2x15 reps         PT Education - 10/20/19 1335    Education Details seated posture with lumbar roll    Person(s) Educated Patient    Methods Explanation    Comprehension Verbalized understanding            PT Short Term Goals - 09/25/19 1335      PT SHORT TERM GOAL #1   Title Pt will be independent with his initial HEP to improve ROM, strength and body mechanics.     Time 4    Period Weeks    Status New    Target Date 10/23/19      PT SHORT TERM GOAL #2   Title Pt will demo improved  posture with sitting and consistent use of lumbar roll for additional back support throughout the day.     Time 4    Period Weeks    Status New      PT SHORT TERM GOAL #3   Title The patient will report decreased left LE numbness/tingling by 40% with usual ADLs    Time 4    Period Weeks    Status Achieved      PT SHORT TERM GOAL #4   Title Pt will demo good understanding of proper posture and mechanics with lifting a small object (less than 5 lb) from the floor, 2/3 trials without PT cuing.     Time 4    Period Weeks    Status New      PT SHORT TERM GOAL #5   Title Lumbar flexion improved to 50 degrees, extension to 35 degrees and sidebending to 35 degrees to aid in mobility needed as caregiver for his mother    Time 4    Period Weeks    Status New             PT Long Term Goals - 09/25/19 1336      PT LONG TERM GOAL #1   Title Pt will be independent with his advanced HEP to improve flexibility, strength and mechanics with daily activity  after d/c from PT.     Time 8    Period Weeks    Status New    Target Date 11/20/19      PT LONG TERM GOAL #2   Title Pt will report  at least 60% improvement in back pain and left LE numbness/tingling    Time 8    Period Weeks    Status New      PT LONG TERM GOAL #3   Title Pt will report being able to sit for at least 30 minutes with pain level 2/10, which will allow him to drive around town without limitation.    Time 8    Period Weeks    Status New      PT LONG TERM GOAL #4   Title Pt will have 4+/5 trunk and left hip strength to be able to lift 10 lb box from the floor with proper mechanics and without increase in hip/back pain, 2/3 trials.    Time 8    Period Weeks    Status New      PT LONG TERM GOAL #5   Title FOTO functional outcome score improved from 45% limitation to 33%    Time 8    Period Weeks    Status New                 Plan - 10/20/19 1336    Clinical Impression Statement Pt has longer lasting improvements in Lt LE symptoms since the last session. He can resolve symptoms with repeated lumbar extension but has difficulty maintaining proper seated posture throughout the day which causes his symptoms to return. PT educated pt on use of lumbar towel roll throughout the day and pt demonstrated proper set up and use of this during the session. Pt was able to properly activate the deep abdominals during seated posture correction following PT verbal/tactile cuing.    Personal Factors and Comorbidities Comorbidity 1;Comorbidity 2;Comorbidity 3+;Past/Current Experience    Comorbidities chronic history spinal pain, anxiety, depression, migraines    Examination-Activity Limitations Sit;Lift;Caring for Others    Examination-Participation Restrictions Driving;Interpersonal Relationship    Stability/Clinical Decision  Making Stable/Uncomplicated    Rehab Potential Good    PT Frequency 2x / week    PT Duration 8 weeks    PT Treatment/Interventions ADLs/Self Care  Home Management;Electrical Stimulation;Cryotherapy;Aquatic Therapy;Moist Heat;Traction;Ultrasound;Neuromuscular re-education;Therapeutic exercise;Therapeutic activities;Patient/family education;Manual techniques;Dry needling;Taping;Spinal Manipulations    PT Next Visit Plan f/u on repeated extension in standing; mechanics and trunk strength;  manual techniques    Consulted and Agree with Plan of Care Patient           Patient will benefit from skilled therapeutic intervention in order to improve the following deficits and impairments:  Decreased range of motion, Pain, Impaired perceived functional ability, Decreased strength, Postural dysfunction  Visit Diagnosis: Chronic left-sided low back pain with left-sided sciatica  Muscle weakness (generalized)  Other muscle spasm     Problem List Patient Active Problem List   Diagnosis Date Noted  . Degenerative cervical disc 05/07/2019  . Left lumbar radiculopathy 10/10/2017  . Migraine without aura and without status migrainosus, not intractable 06/22/2017  . Vitamin D deficiency 05/28/2017  . Scrotal pain 05/28/2017  . Paroxysmal SVT (supraventricular tachycardia) ([place]) 05/28/2017  . Mild left inguinal hernia 11/23/2016  . Chronic pelvic pain in male 09/28/2016  . Abdominal pain, left lower quadrant 09/28/2016  . Isolated proteinuria without specific morphologic lesion 06/27/2016  . Left testicular pain 06/27/2016  . Acute left flank pain 06/27/2016  . Epididymitis, left 06/05/2016  . Pelvic floor dysfunction 04/01/2016  . GERD (gastroesophageal reflux disease) 10/13/2015  . Gastroparesis 10/13/2015  . Intractable chronic migraine without aura and without status migrainosus 12/15/2013  . Hyperlipidemia 04/04/2012  . Mitral valve disease 07/15/2009  . IRRITABLE BOWEL SYNDROME 07/15/2009  . Palpitations 06/28/2009    1:45 PM,10/20/19 Sherol Dade PT, DPT [place] [facility] [address] [place], [name], [address] Phone: [phone]   Fax:  [phone]  Name: William [name] MRN: [id number] Date of Birth: 10-17-1960

## 2019-10-21 DIAGNOSIS — N5082 Scrotal pain: Secondary | ICD-10-CM | POA: Diagnosis not present

## 2019-10-21 DIAGNOSIS — R800 Isolated proteinuria: Secondary | ICD-10-CM | POA: Diagnosis not present

## 2019-10-21 DIAGNOSIS — D519 Vitamin B12 deficiency anemia, unspecified: Secondary | ICD-10-CM | POA: Diagnosis not present

## 2019-10-21 DIAGNOSIS — K3184 Gastroparesis: Secondary | ICD-10-CM | POA: Diagnosis not present

## 2019-10-21 DIAGNOSIS — G8929 Other chronic pain: Secondary | ICD-10-CM | POA: Diagnosis not present

## 2019-10-21 DIAGNOSIS — R002 Palpitations: Secondary | ICD-10-CM | POA: Diagnosis not present

## 2019-10-21 DIAGNOSIS — E559 Vitamin D deficiency, unspecified: Secondary | ICD-10-CM | POA: Diagnosis not present

## 2019-10-21 DIAGNOSIS — G43719 Chronic migraine without aura, intractable, without status migrainosus: Secondary | ICD-10-CM | POA: Diagnosis not present

## 2019-10-21 DIAGNOSIS — R102 Pelvic and perineal pain: Secondary | ICD-10-CM | POA: Diagnosis not present

## 2019-10-21 DIAGNOSIS — I471 Supraventricular tachycardia: Secondary | ICD-10-CM | POA: Diagnosis not present

## 2019-10-21 DIAGNOSIS — N50812 Left testicular pain: Secondary | ICD-10-CM | POA: Diagnosis not present

## 2019-10-21 DIAGNOSIS — M6289 Other specified disorders of muscle: Secondary | ICD-10-CM | POA: Diagnosis not present

## 2019-10-22 ENCOUNTER — Encounter: Payer: PPO | Admitting: Physical Therapy

## 2019-10-29 ENCOUNTER — Encounter: Payer: PPO | Admitting: Physical Therapy

## 2019-11-03 ENCOUNTER — Ambulatory Visit: Payer: PPO | Attending: Family Medicine | Admitting: Physical Therapy

## 2019-11-03 ENCOUNTER — Encounter: Payer: Self-pay | Admitting: Physical Therapy

## 2019-11-03 ENCOUNTER — Other Ambulatory Visit: Payer: Self-pay

## 2019-11-03 DIAGNOSIS — M6281 Muscle weakness (generalized): Secondary | ICD-10-CM | POA: Diagnosis not present

## 2019-11-03 DIAGNOSIS — M62838 Other muscle spasm: Secondary | ICD-10-CM | POA: Insufficient documentation

## 2019-11-03 DIAGNOSIS — G8929 Other chronic pain: Secondary | ICD-10-CM | POA: Insufficient documentation

## 2019-11-03 DIAGNOSIS — M5442 Lumbago with sciatica, left side: Secondary | ICD-10-CM | POA: Diagnosis not present

## 2019-11-03 NOTE — Therapy (Signed)
[name] [name], [name], [address] Phone: [phone]   Fax:  [phone]  Physical Therapy Treatment  Patient Details  Name: William [name] MRN: [id number] Date of Birth: Sep 18, 1960 Referring Provider (PT): Dr. [name]   Encounter Date: 11/03/2019   PT End of Session - 11/03/19 0929    Visit Number 5    Date for PT Re-Evaluation 11/20/19    Authorization Type [employer]    PT Start Time 0845    PT Stop Time 0930    PT Time Calculation (min) 45 min    Activity Tolerance Patient tolerated treatment well;No increased pain    Behavior During Therapy WFL for tasks assessed/performed           Past Medical History:  Diagnosis Date  . Allergy   . Anxiety   . Depression   . Gastroparesis   . Heart murmur   . Migraine headache   . Mitral valve prolapse   . SVT (supraventricular tachycardia) ([place])     History reviewed. No pertinent surgical history.  There were no vitals filed for this visit.   Subjective Assessment - 11/03/19 0900    Subjective Pt still has more of an issue with his Rt shoulder. His leg symptoms are greater than 50% improved. No pain currently.    Pertinent History history of LBP;  anxiety,depression, migraines;  previous pt of BF [name], [place])    Limitations Sitting;Standing;House hold activities    How long can you sit comfortably? 1-2 hours    How long can you walk comfortably? 1 1/2 hours    Diagnostic tests MRI "arthritis" and "pinching in neck"   per MEDICAL RECORD NUMBER stenosis L3-4, L4-5 and L5-S1    Patient Stated Goals relieve pain, get stronger; return to strengthening    Currently in Pain? No/denies                             [facility] Adult PT Treatment/Exercise - 11/03/19 0001      Lumbar Exercises: Aerobic   Nustep L2 x8 min PT present to discuss progress       Lumbar Exercises: Standing   Other Standing Lumbar Exercises BUE  pressdown with blue TB x35 reps, 2x10 reps black TB     Other Standing Lumbar Exercises BUE pressdown sit to stand x10 reps (black TB)       Lumbar Exercises: Supine   Dead Bug 5 reps    Other Supine Lumbar Exercises bent knee 90/90 reactive core with ruler 3x10 reps                   PT Education - 11/03/19 1022    Education Details technique with therex    Person(s) Educated Patient    Methods Explanation    Comprehension Verbalized understanding            PT Short Term Goals - 09/25/19 1335      PT SHORT TERM GOAL #1   Title Pt will be independent with his initial HEP to improve ROM, strength and body mechanics.     Time 4    Period Weeks    Status New    Target Date 10/23/19      PT SHORT TERM GOAL #2   Title Pt will demo improved posture with sitting and consistent use of lumbar roll for additional back support throughout the day.  Time 4    Period Weeks    Status New      PT SHORT TERM GOAL #3   Title The patient will report decreased left LE numbness/tingling by 40% with usual ADLs    Time 4    Period Weeks    Status Achieved      PT SHORT TERM GOAL #4   Title Pt will demo good understanding of proper posture and mechanics with lifting a small object (less than 5 lb) from the floor, 2/3 trials without PT cuing.     Time 4    Period Weeks    Status New      PT SHORT TERM GOAL #5   Title Lumbar flexion improved to 50 degrees, extension to 35 degrees and sidebending to 35 degrees to aid in mobility needed as caregiver for his mother    Time 4    Period Weeks    Status New             PT Long Term Goals - 09/25/19 1336      PT LONG TERM GOAL #1   Title Pt will be independent with his advanced HEP to improve flexibility, strength and mechanics with daily activity after d/c from PT.     Time 8    Period Weeks    Status New    Target Date 11/20/19      PT LONG TERM GOAL #2   Title Pt will report  at least 60% improvement in back pain and  left LE numbness/tingling    Time 8    Period Weeks    Status New      PT LONG TERM GOAL #3   Title Pt will report being able to sit for at least 30 minutes with pain level 2/10, which will allow him to drive around town without limitation.    Time 8    Period Weeks    Status New      PT LONG TERM GOAL #4   Title Pt will have 4+/5 trunk and left hip strength to be able to lift 10 lb box from the floor with proper mechanics and without increase in hip/back pain, 2/3 trials.    Time 8    Period Weeks    Status New      PT LONG TERM GOAL #5   Title FOTO functional outcome score improved from 45% limitation to 33%    Time 8    Period Weeks    Status New                 Plan - 11/03/19 7353    Clinical Impression Statement Pt continues to have improvements in Lt LE symptoms, feeling atleast 50% improved. He can manage his symptoms with standing lumbar extension at home. He requires PT cuing with posture and education on body mechanics. Session focused on progressing trunk strength. He had increased difficulty with dead bug exercise particularly on the Rt, so this was added to his HEP. Due to pt's improvements in symptoms we discussed decreasing his PT frequency to 1x/week moving forward. He was agreeable with this.    Personal Factors and Comorbidities Comorbidity 1;Comorbidity 2;Comorbidity 3+;Past/Current Experience    Comorbidities chronic history spinal pain, anxiety, depression, migraines    Examination-Activity Limitations Sit;Lift;Caring for Others    Examination-Participation Restrictions Driving;Interpersonal Relationship    Stability/Clinical Decision Making Stable/Uncomplicated    Rehab Potential Good    PT Frequency 2x / week  PT Duration 8 weeks    PT Treatment/Interventions ADLs/Self Care Home Management;Electrical Stimulation;Cryotherapy;Aquatic Therapy;Moist Heat;Traction;Ultrasound;Neuromuscular re-education;Therapeutic exercise;Therapeutic  activities;Patient/family education;Manual techniques;Dry needling;Taping;Spinal Manipulations    PT Next Visit Plan f/u on repeated extension in standing; mechanics and trunk strength progression (rotation);  manual techniques    Consulted and Agree with Plan of Care Patient           Patient will benefit from skilled therapeutic intervention in order to improve the following deficits and impairments:  Decreased range of motion, Pain, Impaired perceived functional ability, Decreased strength, Postural dysfunction  Visit Diagnosis: Chronic left-sided low back pain with left-sided sciatica  Muscle weakness (generalized)  Other muscle spasm     Problem List Patient Active Problem List   Diagnosis Date Noted  . Degenerative cervical disc 05/07/2019  . Left lumbar radiculopathy 10/10/2017  . Migraine without aura and without status migrainosus, not intractable 06/22/2017  . Vitamin D deficiency 05/28/2017  . Scrotal pain 05/28/2017  . Paroxysmal SVT (supraventricular tachycardia) ([place]) 05/28/2017  . Mild left inguinal hernia 11/23/2016  . Chronic pelvic pain in male 09/28/2016  . Abdominal pain, left lower quadrant 09/28/2016  . Isolated proteinuria without specific morphologic lesion 06/27/2016  . Left testicular pain 06/27/2016  . Acute left flank pain 06/27/2016  . Epididymitis, left 06/05/2016  . Pelvic floor dysfunction 04/01/2016  . GERD (gastroesophageal reflux disease) 10/13/2015  . Gastroparesis 10/13/2015  . Intractable chronic migraine without aura and without status migrainosus 12/15/2013  . Hyperlipidemia 04/04/2012  . Mitral valve disease 07/15/2009  . IRRITABLE BOWEL SYNDROME 07/15/2009  . Palpitations 06/28/2009   10:30 AM,11/03/19 Sherol Dade PT, DPT [place] [facility] [address] [place], [name], [address] Phone: [phone]    Fax:  [phone]  Name: William [name] MRN: [id number] Date of Birth: Oct 17, 1960

## 2019-11-05 ENCOUNTER — Ambulatory Visit: Payer: PPO | Admitting: Physical Therapy

## 2019-11-05 ENCOUNTER — Other Ambulatory Visit: Payer: Self-pay

## 2019-11-05 ENCOUNTER — Encounter: Payer: Self-pay | Admitting: Physical Therapy

## 2019-11-05 DIAGNOSIS — M6281 Muscle weakness (generalized): Secondary | ICD-10-CM

## 2019-11-05 DIAGNOSIS — M62838 Other muscle spasm: Secondary | ICD-10-CM

## 2019-11-05 DIAGNOSIS — M5442 Lumbago with sciatica, left side: Secondary | ICD-10-CM

## 2019-11-05 NOTE — Therapy (Signed)
[facility] [address] [place], [name], [address] Phone: [phone]   Fax:  [phone]  Physical Therapy Treatment  Patient Details  Name: William [name] MRN: [id number] Date of Birth: 1960/08/29 Referring Provider (PT): Dr. [name]   Encounter Date: 11/05/2019   PT End of Session - 11/05/19 0858    Visit Number 6    Date for PT Re-Evaluation 11/20/19    Authorization Type [employer]    PT Start Time 4403    PT Stop Time 0925    PT Time Calculation (min) 38 min    Activity Tolerance Patient tolerated treatment well;No increased pain    Behavior During Therapy WFL for tasks assessed/performed           Past Medical History:  Diagnosis Date  . Allergy   . Anxiety   . Depression   . Gastroparesis   . Heart murmur   . Migraine headache   . Mitral valve prolapse   . SVT (supraventricular tachycardia) ([place])     History reviewed. No pertinent surgical history.  There were no vitals filed for this visit.   Subjective Assessment - 11/05/19 0929    Subjective Pt has no complaints.    Pertinent History history of LBP;  anxiety,depression, migraines;  previous pt of BF [name], [place])    Limitations Sitting;Standing;House hold activities    How long can you sit comfortably? 1-2 hours    How long can you walk comfortably? 1 1/2 hours    Diagnostic tests MRI "arthritis" and "pinching in neck"   per MEDICAL RECORD NUMBER stenosis L3-4, L4-5 and L5-S1    Patient Stated Goals relieve pain, get stronger; return to strengthening    Currently in Pain? No/denies              [facility] PT Assessment - 11/05/19 0001      Observation/Other Assessments   Focus on Therapeutic Outcomes (FOTO)  35% limitation                         [facility] Adult PT Treatment/Exercise - 11/05/19 0001      Lumbar Exercises: Aerobic   Nustep L2 x10 min PT present to discuss posture adjustments and FOTO       Lumbar Exercises: Seated   Other Seated Lumbar Exercises seated diagonal lifts with yellow TB 2x15 reps, extra set to the Lt each direction    Other Seated Lumbar Exercises adbominal brace with pallof press 2x10 reps each direction (yellow TB)       Lumbar Exercises: Sidelying   Other Sidelying Lumbar Exercises plank elbow/knees 4x5 sec hold                   PT Education - 11/05/19 0919    Education Details updated HEP    Person(s) Educated Patient    Methods Explanation;Verbal cues;Handout    Comprehension Verbalized understanding;Returned demonstration            PT Short Term Goals - 09/25/19 1335      PT SHORT TERM GOAL #1   Title Pt will be independent with his initial HEP to improve ROM, strength and body mechanics.     Time 4    Period Weeks    Status New    Target Date 10/23/19      PT SHORT TERM GOAL #2   Title Pt will demo improved posture with sitting and consistent use  of lumbar roll for additional back support throughout the day.     Time 4    Period Weeks    Status New      PT SHORT TERM GOAL #3   Title The patient will report decreased left LE numbness/tingling by 40% with usual ADLs    Time 4    Period Weeks    Status Achieved      PT SHORT TERM GOAL #4   Title Pt will demo good understanding of proper posture and mechanics with lifting a small object (less than 5 lb) from the floor, 2/3 trials without PT cuing.     Time 4    Period Weeks    Status New      PT SHORT TERM GOAL #5   Title Lumbar flexion improved to 50 degrees, extension to 35 degrees and sidebending to 35 degrees to aid in mobility needed as caregiver for his mother    Time 4    Period Weeks    Status New             PT Long Term Goals - 11/05/19 0909      PT LONG TERM GOAL #1   Title Pt will be independent with his advanced HEP to improve flexibility, strength and mechanics with daily activity after d/c from PT.     Time 8    Period Weeks    Status New      PT  LONG TERM GOAL #2   Title Pt will report  at least 60% improvement in back pain and left LE numbness/tingling    Time 8    Period Weeks    Status New      PT LONG TERM GOAL #3   Title Pt will report being able to sit for at least 30 minutes with pain level 2/10, which will allow him to drive around town without limitation.    Time 8    Period Weeks    Status Achieved      PT LONG TERM GOAL #4   Title Pt will have 4+/5 trunk and left hip strength to be able to lift 10 lb box from the floor with proper mechanics and without increase in hip/back pain, 2/3 trials.    Time 8    Period Weeks    Status New      PT LONG TERM GOAL #5   Title FOTO functional outcome score improved from 45% limitation to 33%    Baseline 35%    Time 8    Period Weeks    Status New                 Plan - 11/05/19 0908    Clinical Impression Statement Pt is making steady progress towards his goals. His FOTO score improved to 35% today. He is mostly limited with long standing/walking activity. Pt's posture awareness and abdominal activation during exercise is greatly improved, requiring less cuing overall. Will continue with current POC and activity progression moving forward.    Personal Factors and Comorbidities Comorbidity 1;Comorbidity 2;Comorbidity 3+;Past/Current Experience    Comorbidities chronic history spinal pain, anxiety, depression, migraines    Examination-Activity Limitations Sit;Lift;Caring for Others    Examination-Participation Restrictions Driving;Interpersonal Relationship    Stability/Clinical Decision Making Stable/Uncomplicated    Rehab Potential Good    PT Frequency 2x / week    PT Duration 8 weeks    PT Treatment/Interventions ADLs/Self Care Home Management;Electrical Stimulation;Cryotherapy;Aquatic Therapy;Moist Heat;Traction;Ultrasound;Neuromuscular re-education;Therapeutic exercise;Therapeutic activities;Patient/family  education;Manual techniques;Dry needling;Taping;Spinal  Manipulations    PT Next Visit Plan f/u on repeated extension in standing; mechanics and trunk strength progression (rotation);  manual techniques    Consulted and Agree with Plan of Care Patient           Patient will benefit from skilled therapeutic intervention in order to improve the following deficits and impairments:  Decreased range of motion, Pain, Impaired perceived functional ability, Decreased strength, Postural dysfunction  Visit Diagnosis: Chronic left-sided low back pain with left-sided sciatica  Muscle weakness (generalized)  Other muscle spasm     Problem List Patient Active Problem List   Diagnosis Date Noted  . Degenerative cervical disc 05/07/2019  . Left lumbar radiculopathy 10/10/2017  . Migraine without aura and without status migrainosus, not intractable 06/22/2017  . Vitamin D deficiency 05/28/2017  . Scrotal pain 05/28/2017  . Paroxysmal SVT (supraventricular tachycardia) ([place]) 05/28/2017  . Mild left inguinal hernia 11/23/2016  . Chronic pelvic pain in male 09/28/2016  . Abdominal pain, left lower quadrant 09/28/2016  . Isolated proteinuria without specific morphologic lesion 06/27/2016  . Left testicular pain 06/27/2016  . Acute left flank pain 06/27/2016  . Epididymitis, left 06/05/2016  . Pelvic floor dysfunction 04/01/2016  . GERD (gastroesophageal reflux disease) 10/13/2015  . Gastroparesis 10/13/2015  . Intractable chronic migraine without aura and without status migrainosus 12/15/2013  . Hyperlipidemia 04/04/2012  . Mitral valve disease 07/15/2009  . IRRITABLE BOWEL SYNDROME 07/15/2009  . Palpitations 06/28/2009    9:29 AM,11/05/19 Sherol Dade PT, DPT [place] [facility] [address] [place], [name], [address] Phone: [phone]   Fax:  [phone]  Name: William [name] MRN: [id number] Date of  Birth: [birth year]

## 2019-11-05 NOTE — Patient Instructions (Signed)
Access Code: [email or web address]: [email or web address]: 07/08/2021Prepared by: Haswell BrassfieldExercises  Standing Lumbar Extension - 4 x daily - 7 x weekly - 1 sets - 10 reps  Standing Anti-Rotation Press with Anchored Resistance - 1 x daily - 7 x weekly - 3 sets - 10 reps  Standing Shoulder Extension with Resistance - 1 x daily - 7 x weekly - 3 sets - 10 reps  Supine Dead Bug with Leg Extension - 1 x daily - 7 x weekly - 2 sets - 5 reps Patient Education  [facility] [address] [place], [place] [address] Phone # [phone] Fax [phone]

## 2019-11-10 ENCOUNTER — Encounter: Payer: PPO | Admitting: Physical Therapy

## 2019-11-12 ENCOUNTER — Encounter: Payer: Self-pay | Admitting: Physical Therapy

## 2019-11-12 ENCOUNTER — Ambulatory Visit: Payer: PPO | Admitting: Physical Therapy

## 2019-11-12 ENCOUNTER — Other Ambulatory Visit: Payer: Self-pay

## 2019-11-12 DIAGNOSIS — M6281 Muscle weakness (generalized): Secondary | ICD-10-CM

## 2019-11-12 DIAGNOSIS — M62838 Other muscle spasm: Secondary | ICD-10-CM

## 2019-11-12 DIAGNOSIS — M5442 Lumbago with sciatica, left side: Secondary | ICD-10-CM

## 2019-11-12 NOTE — Therapy (Signed)
[facility] [address] [place], [name], [address] Phone: [phone]   Fax:  [phone]  Physical Therapy Treatment  Patient Details  Name: William [name] MRN: [id number] Date of Birth: January 17, 1961 Referring Provider (PT): Dr. [name]   Encounter Date: 11/12/2019   PT End of Session - 11/12/19 1051    Visit Number 7    Date for PT Re-Evaluation 11/20/19    Authorization Type [employer]    PT Start Time 1016    PT Stop Time 1055    PT Time Calculation (min) 39 min    Activity Tolerance Patient tolerated treatment well;No increased pain    Behavior During Therapy WFL for tasks assessed/performed           Past Medical History:  Diagnosis Date  . Allergy   . Anxiety   . Depression   . Gastroparesis   . Heart murmur   . Migraine headache   . Mitral valve prolapse   . SVT (supraventricular tachycardia) ([place])     History reviewed. No pertinent surgical history.  There were no vitals filed for this visit.   Subjective Assessment - 11/12/19 1018    Subjective Pt states that his back is 70% better. His arm is bothering him most.    Pertinent History history of LBP;  anxiety,depression, migraines;  previous pt of BF [name], [place])    Limitations Sitting;Standing;House hold activities    How long can you sit comfortably? 1-2 hours    How long can you walk comfortably? 1 1/2 hours    Diagnostic tests MRI "arthritis" and "pinching in neck"   per MEDICAL RECORD NUMBER stenosis L3-4, L4-5 and L5-S1    Patient Stated Goals relieve pain, get stronger; return to strengthening    Currently in Pain? No/denies                             [facility] Adult PT Treatment/Exercise - 11/12/19 0001      Lumbar Exercises: Machines for Strengthening   Leg Press seat 7: BLE #90 x10 reps, increased to #105 x15 reps, #120 x15 reps       Lumbar Exercises: Standing   Other Standing Lumbar Exercises  WBOS pallof press green TB 2x15 reps each direction; sidestepping with UE holding handle bar #15 x10 reps each    Other Standing Lumbar Exercises power tower chops: #20 2x15 reps each           seated aerobc: Nustep L3 x6 min PT discussiong         PT Education - 11/12/19 1051    Education Details TB placement with HEP    Person(s) Educated Patient    Methods Explanation;Demonstration    Comprehension Verbalized understanding            PT Short Term Goals - 09/25/19 1335      PT SHORT TERM GOAL #1   Title Pt will be independent with his initial HEP to improve ROM, strength and body mechanics.     Time 4    Period Weeks    Status New    Target Date 10/23/19      PT SHORT TERM GOAL #2   Title Pt will demo improved posture with sitting and consistent use of lumbar roll for additional back support throughout the day.     Time 4    Period Weeks    Status New  PT SHORT TERM GOAL #3   Title The patient will report decreased left LE numbness/tingling by 40% with usual ADLs    Time 4    Period Weeks    Status Achieved      PT SHORT TERM GOAL #4   Title Pt will demo good understanding of proper posture and mechanics with lifting a small object (less than 5 lb) from the floor, 2/3 trials without PT cuing.     Time 4    Period Weeks    Status New      PT SHORT TERM GOAL #5   Title Lumbar flexion improved to 50 degrees, extension to 35 degrees and sidebending to 35 degrees to aid in mobility needed as caregiver for his mother    Time 4    Period Weeks    Status New             PT Long Term Goals - 11/05/19 0909      PT LONG TERM GOAL #1   Title Pt will be independent with his advanced HEP to improve flexibility, strength and mechanics with daily activity after d/c from PT.     Time 8    Period Weeks    Status New      PT LONG TERM GOAL #2   Title Pt will report  at least 60% improvement in back pain and left LE numbness/tingling    Time 8    Period  Weeks    Status New      PT LONG TERM GOAL #3   Title Pt will report being able to sit for at least 30 minutes with pain level 2/10, which will allow him to drive around town without limitation.    Time 8    Period Weeks    Status Achieved      PT LONG TERM GOAL #4   Title Pt will have 4+/5 trunk and left hip strength to be able to lift 10 lb box from the floor with proper mechanics and without increase in hip/back pain, 2/3 trials.    Time 8    Period Weeks    Status New      PT LONG TERM GOAL #5   Title FOTO functional outcome score improved from 45% limitation to 33%    Baseline 35%    Time 8    Period Weeks    Status New                 Plan - 11/12/19 1051    Clinical Impression Statement Pt continues to make good progress towards his goals. He feels atleast 70% improved with his low back and leg pain from the start of PT. He had some questions regarding his most recent HEP update and PT was able to address this. PT was able to complete standing trunk strengthening exercises. PT provided initial cuing with all of these, but pt's ability to maintain abdominal control is greatly improved. Due to pt's continued improvements, we will plan for likely d/c at his next visit.    Personal Factors and Comorbidities Comorbidity 1;Comorbidity 2;Comorbidity 3+;Past/Current Experience    Comorbidities chronic history spinal pain, anxiety, depression, migraines    Examination-Activity Limitations Sit;Lift;Caring for Others    Examination-Participation Restrictions Driving;Interpersonal Relationship    Stability/Clinical Decision Making Stable/Uncomplicated    Rehab Potential Good    PT Frequency 2x / week    PT Duration 8 weeks    PT Treatment/Interventions ADLs/Self Care Home Management;Electrical  Stimulation;Cryotherapy;Aquatic Therapy;Moist Heat;Traction;Ultrasound;Neuromuscular re-education;Therapeutic exercise;Therapeutic activities;Patient/family education;Manual techniques;Dry  needling;Taping;Spinal Manipulations    PT Next Visit Plan d/c lumbar; review goals, update HEP    Consulted and Agree with Plan of Care Patient           Patient will benefit from skilled therapeutic intervention in order to improve the following deficits and impairments:  Decreased range of motion, Pain, Impaired perceived functional ability, Decreased strength, Postural dysfunction  Visit Diagnosis: Chronic left-sided low back pain with left-sided sciatica  Muscle weakness (generalized)  Other muscle spasm     Problem List Patient Active Problem List   Diagnosis Date Noted  . Degenerative cervical disc 05/07/2019  . Left lumbar radiculopathy 10/10/2017  . Migraine without aura and without status migrainosus, not intractable 06/22/2017  . Vitamin D deficiency 05/28/2017  . Scrotal pain 05/28/2017  . Paroxysmal SVT (supraventricular tachycardia) ([place]) 05/28/2017  . Mild left inguinal hernia 11/23/2016  . Chronic pelvic pain in male 09/28/2016  . Abdominal pain, left lower quadrant 09/28/2016  . Isolated proteinuria without specific morphologic lesion 06/27/2016  . Left testicular pain 06/27/2016  . Acute left flank pain 06/27/2016  . Epididymitis, left 06/05/2016  . Pelvic floor dysfunction 04/01/2016  . GERD (gastroesophageal reflux disease) 10/13/2015  . Gastroparesis 10/13/2015  . Intractable chronic migraine without aura and without status migrainosus 12/15/2013  . Hyperlipidemia 04/04/2012  . Mitral valve disease 07/15/2009  . IRRITABLE BOWEL SYNDROME 07/15/2009  . Palpitations 06/28/2009    11:10 AM,11/12/19 Sherol Dade PT, DPT [facility] [address] [place], [name], [address] Phone: [phone]   Fax:  [phone]  Name: William [name] MRN: [id number] Date of Birth: 05-Jan-1961

## 2019-11-17 ENCOUNTER — Encounter: Payer: PPO | Admitting: Physical Therapy

## 2019-11-18 ENCOUNTER — Telehealth: Payer: Self-pay | Admitting: Neurology

## 2019-11-18 DIAGNOSIS — F438 Other reactions to severe stress: Secondary | ICD-10-CM | POA: Diagnosis not present

## 2019-11-18 DIAGNOSIS — J329 Chronic sinusitis, unspecified: Secondary | ICD-10-CM | POA: Diagnosis not present

## 2019-11-18 DIAGNOSIS — G8929 Other chronic pain: Secondary | ICD-10-CM | POA: Diagnosis not present

## 2019-11-18 DIAGNOSIS — R5382 Chronic fatigue, unspecified: Secondary | ICD-10-CM | POA: Diagnosis not present

## 2019-11-18 DIAGNOSIS — F4323 Adjustment disorder with mixed anxiety and depressed mood: Secondary | ICD-10-CM | POA: Diagnosis not present

## 2019-11-18 DIAGNOSIS — M5413 Radiculopathy, cervicothoracic region: Secondary | ICD-10-CM | POA: Diagnosis not present

## 2019-11-18 DIAGNOSIS — K3184 Gastroparesis: Secondary | ICD-10-CM | POA: Diagnosis not present

## 2019-11-18 DIAGNOSIS — E538 Deficiency of other specified B group vitamins: Secondary | ICD-10-CM | POA: Diagnosis not present

## 2019-11-18 DIAGNOSIS — I471 Supraventricular tachycardia: Secondary | ICD-10-CM | POA: Diagnosis not present

## 2019-11-18 DIAGNOSIS — M5416 Radiculopathy, lumbar region: Secondary | ICD-10-CM | POA: Diagnosis not present

## 2019-11-18 DIAGNOSIS — G43719 Chronic migraine without aura, intractable, without status migrainosus: Secondary | ICD-10-CM | POA: Diagnosis not present

## 2019-11-18 NOTE — Telephone Encounter (Signed)
Patient having more migraines. PCP suggested he contact Dr. Tomi Likens.  Please call

## 2019-11-18 NOTE — Telephone Encounter (Signed)
Pt states his PCP wanted him to call re increased headaches. States he is back on Emgality after being off it a few months. Was given samples of Nurtec 4 tabs a couple weeks ago d/t no insurance coverage but has run out. Told him we can put him on the wait list to see Dr Tomi Likens and will check with him when he returns to the office re more samples. Also told him that Emgality might take several months to become fully effective. Pt verbalized understanding of all information.

## 2019-11-19 ENCOUNTER — Ambulatory Visit: Payer: PPO | Admitting: Physical Therapy

## 2019-11-19 MED ORDER — NURTEC 75 MG PO TBDP: 1.0000 | ORAL_TABLET | ORAL | 0 refills | Status: DC | PRN

## 2019-11-19 NOTE — Telephone Encounter (Signed)
Spoke with pt and inst he can pick up samples of Nurtec 4 tabs anytime during business hours, states he will come by today.

## 2019-11-19 NOTE — Telephone Encounter (Signed)
Yes, he can stop by and we can provide him with 2 packs of Nurtec

## 2019-11-19 NOTE — Addendum Note (Signed)
Addended by: Ladell Pier A on: 11/19/2019 08:32 AM   Modules accepted: Orders

## 2019-11-20 MED ORDER — DILTIAZEM HCL ER COATED BEADS 120 MG PO CP24: 120.0000 mg | ORAL_CAPSULE | Freq: Every day | ORAL | 1 refills | Status: DC

## 2019-12-02 ENCOUNTER — Encounter: Payer: Self-pay | Admitting: Physical Therapy

## 2019-12-02 ENCOUNTER — Ambulatory Visit: Payer: PPO | Attending: Family Medicine | Admitting: Physical Therapy

## 2019-12-02 ENCOUNTER — Other Ambulatory Visit: Payer: Self-pay

## 2019-12-02 DIAGNOSIS — M25511 Pain in right shoulder: Secondary | ICD-10-CM | POA: Diagnosis not present

## 2019-12-02 DIAGNOSIS — M5442 Lumbago with sciatica, left side: Secondary | ICD-10-CM | POA: Insufficient documentation

## 2019-12-02 DIAGNOSIS — G8929 Other chronic pain: Secondary | ICD-10-CM | POA: Insufficient documentation

## 2019-12-02 DIAGNOSIS — M6281 Muscle weakness (generalized): Secondary | ICD-10-CM | POA: Diagnosis not present

## 2019-12-03 DIAGNOSIS — R102 Pelvic and perineal pain: Secondary | ICD-10-CM | POA: Diagnosis not present

## 2019-12-03 DIAGNOSIS — M5415 Radiculopathy, thoracolumbar region: Secondary | ICD-10-CM | POA: Diagnosis not present

## 2019-12-03 DIAGNOSIS — I471 Supraventricular tachycardia: Secondary | ICD-10-CM | POA: Diagnosis not present

## 2019-12-03 DIAGNOSIS — E559 Vitamin D deficiency, unspecified: Secondary | ICD-10-CM | POA: Diagnosis not present

## 2019-12-03 DIAGNOSIS — N5082 Scrotal pain: Secondary | ICD-10-CM | POA: Diagnosis not present

## 2019-12-03 DIAGNOSIS — R202 Paresthesia of skin: Secondary | ICD-10-CM | POA: Diagnosis not present

## 2019-12-03 DIAGNOSIS — M6289 Other specified disorders of muscle: Secondary | ICD-10-CM | POA: Diagnosis not present

## 2019-12-03 DIAGNOSIS — R002 Palpitations: Secondary | ICD-10-CM | POA: Diagnosis not present

## 2019-12-03 DIAGNOSIS — K3184 Gastroparesis: Secondary | ICD-10-CM | POA: Diagnosis not present

## 2019-12-03 DIAGNOSIS — M6281 Muscle weakness (generalized): Secondary | ICD-10-CM | POA: Diagnosis not present

## 2019-12-03 DIAGNOSIS — N50812 Left testicular pain: Secondary | ICD-10-CM | POA: Diagnosis not present

## 2019-12-03 DIAGNOSIS — G8929 Other chronic pain: Secondary | ICD-10-CM | POA: Diagnosis not present

## 2019-12-03 NOTE — Therapy (Signed)
[facility] [address] [place], [name], [address] Phone: [phone]   Fax:  [phone]  Physical Therapy Evaluation  Patient Details  Name: William [name] MRN: [id number] Date of Birth: December 21, 1960 Referring Provider (PT): Dr. [name]   Encounter Date: 12/02/2019   PT End of Session - 12/03/19 1642    Visit Number 1    Date for PT Re-Evaluation 01/14/20    Authorization Type [employer]    PT Start Time 1102    PT Stop Time 1145    PT Time Calculation (min) 43 min    Activity Tolerance Patient tolerated treatment well;No increased pain    Behavior During Therapy WFL for tasks assessed/performed           Past Medical History:  Diagnosis Date  . Allergy   . Anxiety   . Depression   . Gastroparesis   . Heart murmur   . Migraine headache   . Mitral valve prolapse   . SVT (supraventricular tachycardia) ([place])     History reviewed. No pertinent surgical history.  There were no vitals filed for this visit.    Subjective Assessment - 12/02/19 1106    Subjective Pt's back is doing 80% better and he is working on his HEP for that. His Rt shoulder has been an ongoing issue since January after trying to catch his mom during a fall. He has achiness in his Rt shoulder when as rest and will have increase in pain with reaching over head.    Pertinent History history of LBP;  anxiety,depression, migraines;  previous pt of BF [name], [place])    Limitations Sitting;Standing;House hold activities    Diagnostic tests MRI "arthritis" and "pinching in neck"   per MEDICAL RECORD NUMBER stenosis L3-4, L4-5 and L5-S1    Patient Stated Goals relieve pain, get stronger; return to strengthening    Currently in Pain? Yes    Pain Score 4     Pain Location Shoulder    Pain Orientation Right    Pain Descriptors / Indicators Aching;Dull    Pain Type Chronic pain    Pain Radiating Towards always some degree of pain down  to the forearm with hand tingling    Pain Onset More than a month ago    Pain Frequency Constant    Aggravating Factors  reaching overhead, just at rest    Pain Relieving Factors unsure              [facility] PT Assessment - 12/03/19 0001      Assessment   Medical Diagnosis Pain in Rt shoulder     Referring Provider (PT) Dr. [name]    Onset Date/Surgical Date --   January 2021   Hand Dominance Right    Next MD Visit televisit tomorrow     Prior Therapy for low back       Precautions   Precautions None      Balance Screen   Has the patient fallen in the past 6 months No    Has the patient had a decrease in activity level because of a fear of falling?  No    Is the patient reluctant to leave their home because of a fear of falling?  No      Home Ec[employer] residence    Living Arrangements Spouse/significant other;Other relatives      Prior Function   Leisure caring for his mother  Cognition   Overall Cognitive Status Within Functional Limits for tasks assessed      Observation/Other Assessments   Focus on Therapeutic Outcomes (FOTO)  46% limited       AROM   Overall AROM Comments (+) ULTT: median (10 deg abd), radial nerve (15 deg) ; painful reaching overhead on Rt shoulder     Cervical Flexion pain over Rt shoulder     Cervical Extension pain over Rt shoulder     Cervical - Right Rotation 50   (+) pain Rt shoulder    Cervical - Left Rotation 50      Strength   Right Shoulder Flexion 5/5    Right Shoulder ABduction 5/5   (+) pain    Right Shoulder Internal Rotation 5/5    Right Shoulder External Rotation 5/5      Special Tests   Other special tests Rt: (+) neer, (+) hawkin's kennedy                       Objective measurements completed on examination: See above findings.       [facility] Adult PT Treatment/Exercise - 12/03/19 0001      Exercises   Exercises Neck      Neck Exercises: Supine   Capital Flexion 5  reps;3 secs    Capital Flexion Limitations HEP demo                   PT Education - 12/03/19 1641    Education Details HEP implemented for cervical    Person(s) Educated Patient    Methods Explanation;Handout    Comprehension Verbalized understanding;Returned demonstration            PT Short Term Goals - 12/03/19 1653      PT SHORT TERM GOAL #1   Title Pt will be independent with his initial HEP to improve ROM, strength and body mechanics.     Time 3    Period Weeks    Status New    Target Date 12/23/19      PT SHORT TERM GOAL #2   Title Pt will demo improved posture with sitting and consistent use of lumbar roll for additional back support and neck alignment throughout the day.    Time 3    Period Weeks    Status New      PT SHORT TERM GOAL #4   Title Pt will demo good understanding of proper posture and mechanics with lifting a small object (less than 5 lb) from the floor, 2/3 trials without PT cuing.     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     Time 6    Period Weeks    Status New    Target Date 01/14/20      PT LONG TERM GOAL #2  Title Pt will report  at least 60% improvement in Rt UE pain and use throughout the day.    Time 6    Period Weeks    Status New      PT LONG TERM GOAL #3   Title Pt will have pain free cervical active ROM to improve his ability to drive and complete other daily activity.    Time 6    Period Weeks    Status Achieved      PT LONG TERM GOAL #4   Title Pt will be able to demonstrate  safe technique with assisting his mother out of a chair, to decrease risk of future reinjury.    Time 6    Period Weeks    Status New      PT LONG TERM GOAL #5   Title Pt will be able to reach overhead into a cabinet x10 reps without increase in Rt shoulder pain.    Time 6    Period Weeks    Status New                  Plan - 12/03/19 1643    Clinical Impression Statement Pt was recently referred to OPPT with complaints of Rt UE pain and Lt low back pain with radicular symptoms following an attempt to catch his mother from a fall earlier this year. Pt has completed PT for his low back/LE and is greater than 80% improved. He is now being evaluated for his Rt UE/neck pain which has been ongoing. Pt has Rt UE weakness and painful cervical active ROM. Upper limb tension testing is positive on the Rt with intermittent pain and tingling into the Rt UE. Pt demonstrates poor posture with increased kyphosis and forward head, which likely is contributing to his symptoms. Pt has painful reach overhead, poor scapula control and positive hawkin's kennedy and neer test on the Rt indicating possible rotator cuff pathology as well. Pt would benefit from skilled PT to address his posture, cervical ROM, and shoulder control in order to facilitate improvements in sleep and daily activity in caring for his mother.    Personal Factors and Comorbidities Comorbidity 1;Comorbidity 2;Comorbidity 3+;Past/Current Experience    Comorbidities chronic history spinal pain, anxiety, depression, migraines    Examination-Activity Limitations Lift;Caring for Others    Examination-Participation Restrictions Interpersonal Relationship    Stability/Clinical Decision Making Stable/Uncomplicated    Rehab Potential Good    PT Frequency 2x / week   1-2x/week   PT Duration 6 weeks    PT Treatment/Interventions ADLs/Self Care Home Management;Electrical Stimulation;Cryotherapy;Aquatic Therapy;Moist  Heat;Traction;Ultrasound;Neuromuscular re-education;Therapeutic exercise;Therapeutic activities;Patient/family education;Manual techniques;Dry needling;Taping;Spinal Manipulations;Iontophoresis 4mg /ml Dexamethasone    PT Next Visit Plan nerve flossing UE median and radial; cervical retraction-posture strengthening    PT Home Exercise Plan cervical retraction    Consulted and Agree with Plan of Care Patient           Patient will benefit from skilled therapeutic intervention in order to improve the following deficits and impairments:  Decreased range of motion, Pain, Impaired perceived functional ability, Decreased strength, Postural dysfunction, Improper body mechanics, Increased muscle spasms  Visit Diagnosis: Right shoulder pain, unspecified chronicity     Problem List Patient Active Problem List   Diagnosis Date Noted  . Degenerative cervical disc 05/07/2019  . Left lumbar radiculopathy 10/10/2017  . Migraine without aura and without status migrainosus, not intractable 06/22/2017  . Vitamin D deficiency 05/28/2017  . Scrotal pain 05/28/2017  . Paroxysmal SVT (supraventricular  tachycardia) ([place]) 05/28/2017  . Mild left inguinal hernia 11/23/2016  . Chronic pelvic pain in male 09/28/2016  . Abdominal pain, left lower quadrant 09/28/2016  . Isolated proteinuria without specific morphologic lesion 06/27/2016  . Left testicular pain 06/27/2016  . Acute left flank pain 06/27/2016  . Epididymitis, left 06/05/2016  . Pelvic floor dysfunction 04/01/2016  . GERD (gastroesophageal reflux disease) 10/13/2015  . Gastroparesis 10/13/2015  . Intractable chronic migraine without aura and without status migrainosus 12/15/2013  . Hyperlipidemia 04/04/2012  . Mitral valve disease 07/15/2009  . IRRITABLE BOWEL SYNDROME 07/15/2009  . Palpitations 06/28/2009   5:01 PM,12/03/19 Sherol Dade PT, DPT [facility] [address], [place], [name], [address] Phone: [phone]   Fax:  [phone]  Name: Raheem Kolbe MRN: [id number] Date of Birth: 08-19-1960

## 2019-12-10 ENCOUNTER — Encounter: Payer: PPO | Admitting: Physical Therapy

## 2019-12-16 ENCOUNTER — Encounter: Payer: PPO | Admitting: Physical Therapy

## 2019-12-16 DIAGNOSIS — G43719 Chronic migraine without aura, intractable, without status migrainosus: Secondary | ICD-10-CM | POA: Diagnosis not present

## 2019-12-16 DIAGNOSIS — I471 Supraventricular tachycardia: Secondary | ICD-10-CM | POA: Diagnosis not present

## 2019-12-16 DIAGNOSIS — M7541 Impingement syndrome of right shoulder: Secondary | ICD-10-CM | POA: Diagnosis not present

## 2019-12-16 DIAGNOSIS — K3184 Gastroparesis: Secondary | ICD-10-CM | POA: Diagnosis not present

## 2019-12-16 DIAGNOSIS — E538 Deficiency of other specified B group vitamins: Secondary | ICD-10-CM | POA: Diagnosis not present

## 2019-12-16 DIAGNOSIS — R5382 Chronic fatigue, unspecified: Secondary | ICD-10-CM | POA: Diagnosis not present

## 2019-12-16 DIAGNOSIS — E559 Vitamin D deficiency, unspecified: Secondary | ICD-10-CM | POA: Diagnosis not present

## 2019-12-16 DIAGNOSIS — G8929 Other chronic pain: Secondary | ICD-10-CM | POA: Diagnosis not present

## 2019-12-16 DIAGNOSIS — R002 Palpitations: Secondary | ICD-10-CM | POA: Diagnosis not present

## 2019-12-16 DIAGNOSIS — F438 Other reactions to severe stress: Secondary | ICD-10-CM | POA: Diagnosis not present

## 2019-12-16 DIAGNOSIS — M5416 Radiculopathy, lumbar region: Secondary | ICD-10-CM | POA: Diagnosis not present

## 2019-12-16 DIAGNOSIS — M6289 Other specified disorders of muscle: Secondary | ICD-10-CM | POA: Diagnosis not present

## 2019-12-17 ENCOUNTER — Encounter: Payer: Self-pay | Admitting: Physical Therapy

## 2019-12-17 ENCOUNTER — Other Ambulatory Visit: Payer: Self-pay

## 2019-12-17 ENCOUNTER — Ambulatory Visit: Payer: PPO | Admitting: Physical Therapy

## 2019-12-17 DIAGNOSIS — G8929 Other chronic pain: Secondary | ICD-10-CM

## 2019-12-17 DIAGNOSIS — M25511 Pain in right shoulder: Secondary | ICD-10-CM

## 2019-12-17 DIAGNOSIS — M6281 Muscle weakness (generalized): Secondary | ICD-10-CM

## 2019-12-17 NOTE — Therapy (Signed)
[facility] [address] [place], [name], [address] Phone: [phone]   Fax:  [phone]  Physical Therapy Treatment  Patient Details  Name: William [name] MRN: [id number] Date of Birth: 09-15-1960 Referring Provider (PT): Dr. [name]   Encounter Date: 12/17/2019   PT End of Session - 12/17/19 0919    Visit Number 2    Date for PT Re-Evaluation 01/14/20    Authorization Type [employer]    PT Start Time 0845    PT Stop Time 0925    PT Time Calculation (min) 40 min    Activity Tolerance Patient tolerated treatment well;No increased pain    Behavior During Therapy WFL for tasks assessed/performed           Past Medical History:  Diagnosis Date   Allergy    Anxiety    Depression    Gastroparesis    Heart murmur    Migraine headache    Mitral valve prolapse    SVT (supraventricular tachycardia) ([place])     History reviewed. No pertinent surgical history.  There were no vitals filed for this visit.   Subjective Assessment - 12/17/19 0853    Subjective Pt states that his Rt shoulder is still bothering him.    Pertinent History history of LBP;  anxiety,depression, migraines;  previous pt of BF [name], Jen)    Limitations Sitting;Standing;House hold activities    Diagnostic tests MRI "arthritis" and "pinching in neck"   per MEDICAL RECORD NUMBER stenosis L3-4, L4-5 and L5-S1    Patient Stated Goals relieve pain, get stronger; return to strengthening    Currently in Pain? Yes    Pain Score 5     Pain Location Shoulder    Pain Orientation Right    Pain Descriptors / Indicators Aching;Dull    Pain Type Chronic pain    Pain Onset More than a month ago    Pain Frequency Intermittent    Aggravating Factors  using the arm and reaching overhead-sleeping/sitting/driving                             [facility] Adult PT Treatment/Exercise - 12/17/19 0001      Neck Exercises:  Standing   Other Standing Exercises Rt shoulder flexion slide x15 reps     Other Standing Exercises shoudler extension blue TB x15 reps       Neck Exercises: Seated   Other Seated Exercise shoulder flexion AAROM with SPC x10 reps       Neck Exercises: Supine   Shoulder Flexion Both;10 reps    Shoulder Flexion Limitations yellow TB around wrists to encourage shoulder horizontal abduction     Other Supine Exercise B shoulder horizontal abduction red TB x15 reps; shoulder ER with yellow TB 2x10 reps                   PT Education - 12/17/19 0919    Education Details technique with therex    Person(s) Educated Patient    Methods Explanation;Handout    Comprehension Verbalized understanding;Returned demonstration            PT Short Term Goals - 12/03/19 1653      PT SHORT TERM GOAL #1   Title Pt will be independent with his initial HEP to improve ROM, strength and body mechanics.     Time 3    Period Weeks    Status New  Target Date 12/23/19      PT SHORT TERM GOAL #2   Title Pt will demo improved posture with sitting and consistent use of lumbar roll for additional back support and neck alignment throughout the day.    Time 3    Period Weeks    Status New      PT SHORT TERM GOAL #4   Title Pt will demo good understanding of proper posture and mechanics with lifting a small object (less than 5 lb) from the floor, 2/3 trials without PT cuing.     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     Time 6    Period Weeks    Status New     Target Date 01/14/20      PT LONG TERM GOAL #2   Title Pt will report  at least 60% improvement in Rt UE pain and use throughout the day.    Time 6    Period Weeks    Status New      PT LONG TERM GOAL #3   Title Pt will have pain free cervical active ROM to improve his ability to drive and complete other daily activity.    Time 6    Period Weeks    Status Achieved      PT LONG TERM GOAL #4   Title Pt will be able to demonstrate safe technique with assisting his mother out of a chair, to decrease risk of future reinjury.    Time 6    Period Weeks    Status New      PT LONG TERM GOAL #5   Title Pt will be able to reach overhead into a cabinet x10 reps without increase in Rt shoulder pain.    Time 6    Period Weeks    Status New                 Plan - 12/17/19 2330    Clinical Impression Statement Pt arrives for his first treatment session since the evaluation for his Rt shoulder pain. Pt was able to complete posterior shoulder exercises without increase in pain. He required PT cuing to increase his awareness of proper muscle activation and avoiding pushing too far into pain with reach overhead. PT updates his HEP to include active assisted shoulder flexion ROM which pt was able to complete without increase in pain. Will continue with current POC.    Personal Factors and Comorbidities Comorbidity 1;Comorbidity 2;Comorbidity 3+;Past/Current Experience    Comorbidities chronic history spinal pain, anxiety, depression, migraines    Examination-Activity Limitations Lift;Caring for Others    Examination-Participation Restrictions Interpersonal Relationship    Stability/Clinical Decision Making Stable/Uncomplicated    Rehab Potential Good    PT Frequency 2x / week   1-2x/week   PT Duration 6 weeks    PT  Treatment/Interventions ADLs/Self Care Home Management;Electrical Stimulation;Cryotherapy;Aquatic Therapy;Moist Heat;Traction;Ultrasound;Neuromuscular re-education;Therapeutic  exercise;Therapeutic activities;Patient/family education;Manual techniques;Dry needling;Taping;Spinal Manipulations;Iontophoresis 4mg /ml Dexamethasone    PT Next Visit Plan nerve flossing UE median and radial; shoulder strength progression/AAROM; cervical retraction-posture strengthening    PT Home Exercise Plan cervical retraction    Consulted and Agree with Plan of Care Patient           Patient will benefit from skilled therapeutic intervention in order to improve the following deficits and impairments:  Decreased range of motion, Pain, Impaired perceived functional ability, Decreased strength, Postural dysfunction, Improper body mechanics, Increased muscle spasms  Visit Diagnosis: Right shoulder pain, unspecified chronicity  Chronic left-sided low back pain with left-sided sciatica  Muscle weakness (generalized)     Problem List Patient Active Problem List   Diagnosis Date Noted   Degenerative cervical disc 05/07/2019   Left lumbar radiculopathy 10/10/2017   Migraine without aura and without status migrainosus, not intractable 06/22/2017   Vitamin D deficiency 05/28/2017   Scrotal pain 05/28/2017   Paroxysmal SVT (supraventricular tachycardia) ([place]) 05/28/2017   Mild left inguinal hernia 11/23/2016   Chronic pelvic pain in male 09/28/2016   Abdominal pain, left lower quadrant 09/28/2016   Isolated proteinuria without specific morphologic lesion 06/27/2016   Left testicular pain 06/27/2016   Acute left flank pain 06/27/2016   Epididymitis, left 06/05/2016   Pelvic floor dysfunction 04/01/2016   GERD (gastroesophageal reflux disease) 10/13/2015   Gastroparesis 10/13/2015   Intractable chronic migraine without aura and without status migrainosus 12/15/2013   Hyperlipidemia 04/04/2012   Mitral valve disease 07/15/2009   IRRITABLE BOWEL SYNDROME 07/15/2009   Palpitations 06/28/2009    10:10 AM,12/17/19 Sherol Dade PT, DPT [facility] [address] [place], [name], [address] Phone: [phone]   Fax:  [phone]  Name: William [name] MRN: [id number] Date of Birth: [birth year]

## 2019-12-17 NOTE — Patient Instructions (Signed)
Access Code: [email or web address] URL: [email or web address] Date: 12/17/2019 Prepared by: [place]  Exercises Supine Shoulder Horizontal Abduction with Resistance - 1 x daily - 7 x weekly - 2 sets - 10 reps Supine Shoulder External Rotation with Resistance - 1 x daily - 7 x weekly - 2 sets - 10 reps Standing Shoulder Row with Anchored Resistance - 1 x daily - 7 x weekly - 3 sets - 10 reps Shoulder Flexion Wall Slide with Towel - 1 x daily - 7 x weekly - 2 sets - 15 reps     [facility] [address], [place], [place] [address] Phone # [phone] Fax [phone]

## 2019-12-18 ENCOUNTER — Ambulatory Visit (INDEPENDENT_AMBULATORY_CARE_PROVIDER_SITE_OTHER): Payer: PPO | Admitting: Family Medicine

## 2019-12-18 ENCOUNTER — Encounter: Payer: Self-pay | Admitting: Family Medicine

## 2019-12-18 ENCOUNTER — Other Ambulatory Visit: Payer: Self-pay

## 2019-12-18 ENCOUNTER — Ambulatory Visit: Payer: Self-pay

## 2019-12-18 VITALS — BP 110/80 | HR 83 | Ht 70.0 in | Wt 147.0 lb

## 2019-12-18 DIAGNOSIS — G8929 Other chronic pain: Secondary | ICD-10-CM | POA: Diagnosis not present

## 2019-12-18 DIAGNOSIS — M75111 Incomplete rotator cuff tear or rupture of right shoulder, not specified as traumatic: Secondary | ICD-10-CM | POA: Diagnosis not present

## 2019-12-18 DIAGNOSIS — M25511 Pain in right shoulder: Secondary | ICD-10-CM | POA: Diagnosis not present

## 2019-12-18 DIAGNOSIS — M75101 Unspecified rotator cuff tear or rupture of right shoulder, not specified as traumatic: Secondary | ICD-10-CM | POA: Insufficient documentation

## 2019-12-18 NOTE — Progress Notes (Signed)
[place] [address] [place] Phone: [phone] Subjective:   I William [name] am serving as a Education administrator for Dr. Hulan Saas.  This visit occurred during the SARS-CoV-2 public health emergency.  Safety protocols were in place, including screening questions prior to the visit, additional usage of staff PPE, and extensive cleaning of exam room while observing appropriate contact time as indicated for disinfecting solutions.   I'm seeing this patient by the request  of:  Shawnee Knapp, MD  CC: Right shoulder pain  HDQ:[id number]   06/09/2019 59 year old gentleman with degenerative disc disease of the cervical spine with moderate spinal stenosis as well as moderate spinal stenosis of the lumbar spine with radicular symptoms.  Discussed different treatment options and patient has elected to do gabapentin as well as prednisone 1 more burst.  See if this will be beneficial.  If no significant improvement encouraged him to do epidurals of the neck and back.  Patient is in agreement with the plan.  We discussed over-the-counter medications including vitamin D of 2000 IUs daily, tart cherry extract 1200 mg at night as another potential.  Follow-up with me again potentially virtually in 2 to 4 weeks   Update 12/18/2019 William [name] is a 59 y.o. male coming in with complaint of right shoulder pain. Patient states he would like an injection in the shoulder. States Dr. Brigitte Pulse believes it is a pinched nerve. Achy pain in the bicep and posterior shoulder. Injured it in January trying to catch his mom falling off the bed. Neck and back pain as well. Numbness and tingling in the hand that is constant. Some limited ROM.  It is waking him up at night as well.  Patient denies any true numbness       Past Medical History:  Diagnosis Date  . Allergy   . Anxiety   . Depression   . Gastroparesis   . Heart murmur   . Migraine headache   . Mitral valve  prolapse   . SVT (supraventricular tachycardia) ([facility])    No past surgical history on file. Social History   Socioeconomic History  . Marital status: Married    Spouse name: Not on file  . Number of children: 0  . Years of education: Not on file  . Highest education level: Bachelor's degree (e.g., BA, AB, BS)  Occupational History  . Not on file  Tobacco Use  . Smoking status: Never Smoker  . Smokeless tobacco: Never Used  Vaping Use  . Vaping Use: Never used  Substance and Sexual Activity  . Alcohol use: No    Alcohol/week: 0.0 standard drinks  . Drug use: No  . Sexual activity: Yes    Partners: Female  Other Topics Concern  . Not on file  Social History Narrative   Pt is married lives with spouse in 1 story apartment he has No children   BS degree- he is right handed   Drinks soda  Ginger ale, no coffee no tea   Social Determinants of Radio broadcast assistant Strain:   . Difficulty of Paying Living Expenses: Not on file  Food Insecurity:   . Worried About Charity fundraiser in the Last Year: Not on file  . Ran Out of Food in the Last Year: Not on file  Transportation Needs:   . Lack of Transportation (Medical): Not on file  . Lack of Transportation (Non-Medical): Not on file  Physical Activity:   .  Days of Exercise per Week: Not on file  . Minutes of Exercise per Session: Not on file  Stress:   . Feeling of Stress : Not on file  Social Connections:   . Frequency of Communication with Friends and Family: Not on file  . Frequency of Social Gatherings with Friends and Family: Not on file  . Attends Religious Services: Not on file  . Active Member of Clubs or Organizations: Not on file  . Attends Archivist Meetings: Not on file  . Marital Status: Not on file   Allergies  Allergen Reactions  . Erythromycin Anaphylaxis and Nausea And Vomiting  . Nalbuphine Other (See Comments)    States loss of consciousness   . Cefdinir Diarrhea, Nausea And  Vomiting and Rash  . Cephalexin Diarrhea and Nausea And Vomiting  . Cephalosporins Diarrhea and Nausea And Vomiting  . Hydrocodone-Acetaminophen Nausea And Vomiting  . Ketorolac Nausea And Vomiting  . Prochlorperazine Edisylate Other (See Comments)    uncontrollable shake - tremor   . Restasis [Cyclosporine] Other (See Comments)    migraines  . Tyloxapol Swelling  . Propranolol Diarrhea  . Topamax [Topiramate] Diarrhea and Nausea And Vomiting  . Zetia [Ezetimibe] Diarrhea  . Doxycycline Hyclate Nausea Only and Other (See Comments)    abd pain  . Iodinated Diagnostic Agents Other (See Comments)    Lightheaded, flushed feeling (explained to patient these are normal side effects of IV iodinated contrast, not allergies, but he wanted this noted in the epic; he tolerated epidural steroid injections w/ contrast w/o difficulty)  . Ketoconazole Itching  . Latex Itching  . Neosporin [Neomycin-Bacitracin Zn-Polymyx] Rash  . Oxycodone-Acetaminophen Other (See Comments)    Tylox caused constipation  . Prednisone Nausea And Vomiting    Oral route, only   Family History  Problem Relation Age of Onset  . Hypertension Mother   . Hypertension Brother   . Kidney failure Maternal Grandmother   . Prostate cancer Neg Hx   . Kidney cancer Neg Hx      Current Outpatient Medications (Cardiovascular):  .  diltiazem (CARDIZEM CD) 120 MG 24 hr capsule, Take 1 capsule (120 mg total) by mouth daily. [name]  diltiazem (CARDIZEM) 30 MG tablet, TAKE (1) TABLET AS NEEDED FOR PALPITATIONS  Current Outpatient Medications (Respiratory):  .  fluticasone (FLONASE) 50 MCG/ACT nasal spray, Place 2 sprays into both nostrils daily.  Current Outpatient Medications (Analgesics):  [name]  EMGALITY 120 MG/ML SOAJ,  .  Rimegepant Sulfate (NURTEC) 75 MG TBDP, Take 75 mg by mouth as needed for up to 1 dose (Maximum 1 tablet in 24 hours.).   Current Outpatient Medications (Other):  .  baclofen (LIORESAL) 10 MG tablet, Take 10  mg by mouth 3 (three) times daily. .  ondansetron (ZOFRAN) 4 MG tablet, Take 4 mg by mouth as needed.    Reviewed prior external information including notes and imaging from  primary care provider As well as notes that were available from care everywhere and other healthcare systems.  Past medical history, social, surgical and family history all reviewed in electronic medical record.  No pertanent information unless stated regarding to the chief complaint.   Review of Systems:  No headache, visual changes, nausea, vomiting, diarrhea, constipation, dizziness, abdominal pain, skin rash, fevers, chills, night sweats, weight loss, swollen lymph nodes,  joint swelling, chest pain, shortness of breath, mood changes. POSITIVE muscle aches,   Objective  Blood pressure 110/80, pulse 83, height 5\' 10"  (1.778 m), weight  147 lb (66.7 kg), SpO2 97 %.   General: No apparent distress alert and oriented x3 mood and affect normal, dressed appropriately.  HEENT: Pupils equal, extraocular movements intact  Respiratory: Patient's speak in full sentences and does not appear short of breath  Cardiovascular: No lower extremity edema, non tender, no erythema  Neuro: Cranial nerves II through XII are intact, neurovascularly intact in all extremities with 2+ DTRs and 2+ pulses.  Gait normal with good balance and coordination.  MSK: Right shoulder exam does have some near full range of motion.  Positive impingement noted 4+ out of 5 strength of the rotator cuff on the right side compared to the left.  Does have some voluntary guarding  Limited musculoskeletal ultrasound was performed and interpreted by Lyndal Pulley  Limited ultrasound shows the patient does have a partial tear noted of the supraspinatus with retraction of 0.9 cm.  This seems to be a low-grade tear. Impression: Rotator cuff tear  Procedure: Real-time Ultrasound Guided Injection of right glenohumeral joint Device: GE Logiq Q7  Ultrasound guided  injection is preferred based studies that show increased duration, increased effect, greater accuracy, decreased procedural pain, increased response rate with ultrasound guided versus blind injection.  Verbal informed consent obtained.  Time-out conducted.  Noted no overlying erythema, induration, or other signs of local infection.  Skin prepped in a sterile fashion.  Local anesthesia: Topical Ethyl chloride.  With sterile technique and under real time ultrasound guidance:  Joint visualized.  23g 1  inch needle inserted posterior approach. Pictures taken for needle placement. Patient did have injection of 2 cc of 1% lidocaine, 2 cc of 0.5% Marcaine, and 1.0 cc of Kenalog 40 mg/dL. Completed without difficulty  Pain immediately resolved suggesting accurate placement of the medication.  Advised to call if fevers/chills, erythema, induration, drainage, or persistent bleeding.   Impression: Technically successful ultrasound guided injection.    Impression and Recommendations:     The above documentation has been reviewed and is accurate and complete Lyndal Pulley, DO       Note: This dictation was prepared with Dragon dictation along with smaller phrase technology. Any transcriptional errors that result from this process are unintentional.

## 2019-12-18 NOTE — Assessment & Plan Note (Signed)
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  No true injury that has been stated though.  Patient does have degenerative disc disease of the cervical spine that is also likely contributing and we can consider the possibility of an epidural.  Multiple different allergies to medications the patient wants to hold on any new medications but will restart formal physical therapy.  I am hopeful that patient does well with this conservative therapy and will see me again in 6 to 7 weeks.

## 2019-12-18 NOTE — Patient Instructions (Signed)
Exercise 3 times a week Injected shoulder if no better we can consider epidural See me again in 6-7 weeks

## 2019-12-22 ENCOUNTER — Ambulatory Visit: Payer: PPO | Admitting: Physical Therapy

## 2019-12-23 ENCOUNTER — Other Ambulatory Visit: Payer: Self-pay

## 2019-12-23 ENCOUNTER — Encounter: Payer: PPO | Admitting: Physical Therapy

## 2019-12-23 ENCOUNTER — Encounter: Payer: Self-pay | Admitting: Neurology

## 2019-12-23 ENCOUNTER — Telehealth (INDEPENDENT_AMBULATORY_CARE_PROVIDER_SITE_OTHER): Payer: PPO | Admitting: Neurology

## 2019-12-23 DIAGNOSIS — G43009 Migraine without aura, not intractable, without status migrainosus: Secondary | ICD-10-CM

## 2019-12-23 NOTE — Progress Notes (Signed)
Virtual Visit via Video Note The purpose of this virtual visit is to provide medical care while limiting exposure to the novel coronavirus.    Consent was obtained for video visit:  Yes.   Answered questions that patient had about telehealth interaction:  Yes.   I discussed the limitations, risks, security and privacy concerns of performing an evaluation and management service by telemedicine. I also discussed with the patient that there may be a patient responsible charge related to this service. The patient expressed understanding and agreed to proceed.  Pt location: Home Physician Location: office Name of referring provider:  Shawnee Knapp, MD I connected with William [name] at patients initiation/request on 12/23/2019 at  8:50 AM EDT by video enabled telemedicine application and verified that I am speaking with the correct person using two identifiers. Pt MRN:  [id number] Pt DOB:  Feb 15, 1961 Video Participants:  William [name]   History of Present Illness:  William [name] is a 58 year old right-handed male withhyperlipidemia, MVP, IBS, gastroparesis, generalized anxiety disorder and palpitations who follows up for migraines.  UPDATE: About 3 or 4 months ago, he was in the doughnut hole, he missed Emgality and had increase in migraines.  He has since signed up for financial assistance.   Restarted Nurtec in July.  He is now feeling better.  No headaches for past 2 weeks.   Intensity:  Moderate to severe Duration:  Less than 15 minutes with Nurtec Frequency:  5 in past 30 days (2 were severe or what he would classify as migraine) Frequency of abortive medication: 5 days in past 30 days Current NSAIDS:none Current analgesics:None Current triptans:Contraindicated (MVP) Current ergotamine:none Current anti-emetic:Zofran 4mg  Current muscle relaxants:baclofen 10mg  TID Current anti-anxiolytic:none Current sleep aide:none Current Antihypertensive  medications:Cardizem Current Antidepressant medications:none Current Anticonvulsant medications:none Current anti-CGRP:Emgality; Nurtec Current Vitamins/Herbal/Supplements:D Current Antihistamines/Decongestants:Claritin, Flonase Other therapy:none  Caffeine:  No coffee, tea or caffeinated soda Diet:  Needs to improve.  Sometimes ginger ale Exercise:  No Depression:  no; Anxiety:  Yes.  Some stress related to caring for his mother Other pain:  no Sleep hygiene:  Poor.  Stress related to caregiver for his mother  HISTORY: Onset: 1986. Location: 90% of time: Left retro-orbital region, 10% bi-frontal Quality:stabbing Initial intensity: Fluctuates from 4/10-10/10 Aura:no Prodrome:no Associated symptoms: Lightheadedness, fatigue, nausea, tearing of left eye. Initial duration:4 hours Initial frequency:3 to 4 times a week Triggers/aggravating factors: Afrin, odors (perfumes, cigarette, chinese food), MSG, salt, stress, extreme temperatures Relieving factors: Water Activity:About 4 to 5 days a week, cannot function  Past NSAIDS:Indomethacin Past analgesics:Nucynta, lidocaine patches, tramadol, nalbuphine, Tylox, bupivacaine injections, hydrocodone, morphine, Tylenol #2, Tylenol Past abortive triptans:Sumatriptan po/, Maxalt, Relpax, Amerge, Zomig Past abortive ergotamine:Migranal, DHE protocol Past muscle relaxants:Robaxin, Flexeril Past anti-emetic:Zofran, Phenergan Past anxiolytics: Temazepam, clonazepam Past antihypertensive medications:Inderal, metoprolol, verapamil Past antidepressant medications:Nortriptyline, amitriptyline, venlafaxine, Wellbutrin, Remeron Past anticonvulsant medications:Depakote, topiramate (weight loss, diarrhea), [place], gabapentin Past anti-CGRP:none Past vitamins/Herbal/Supplements:petasites Past antihistamines/decongestants:none Other past therapies:Biofeedback, acupuncture,  counseling   He has had multiple imaging of the head, all unremarkable, including: CT HEAD from 06/01/09 and 07/30/09. MRI BRAIN W/WO from 12/08/02, 01/19/05, 04/18/12. MRI of brain ordered by me from 11/19/2015 was personally reviewed and was normal.  CERVICAL XR from 01/15/08 showed mild degenerative disc disease at C5-6.  He has seen multiple neurologists and headache and pain specialists, including Dr. Morene Antu, Dr. Asencion Partridge Dohmeieir, Dr. Earley Favor, Dr. Melton Alar, Dr. Rock Nephew at [facility] and he was seen at the [facility] clinic.  He also has history of some obsessive compulsive symptoms since he was young.For example, he repeatedly washes his hands and always uses a new cup during the day when he drinks something.He has history of low blood pressure and has been evaluated by cardiology for palpitations.Work up was unremarkable.  Past Medical History: Past Medical History:  Diagnosis Date  . Allergy   . Anxiety   . Depression   . Gastroparesis   . Heart murmur   . Migraine headache   . Mitral valve prolapse   . SVT (supraventricular tachycardia) (HCC)     Medications: Outpatient Encounter Medications as of 12/23/2019  Medication Sig  . baclofen (LIORESAL) 10 MG tablet Take 10 mg by mouth 3 (three) times daily.  [name] diltiazem (CARDIZEM CD) 120 MG 24 hr capsule Take 1 capsule (120 mg total) by mouth daily.  [name] diltiazem (CARDIZEM) 30 MG tablet TAKE (1) TABLET AS NEEDED FOR PALPITATIONS  . EMGALITY 120 MG/ML SOAJ   . fluticasone (FLONASE) 50 MCG/ACT nasal spray Place 2 sprays into both nostrils daily.  . ondansetron (ZOFRAN) 4 MG tablet Take 4 mg by mouth as needed.   . Rimegepant Sulfate (NURTEC) 75 MG TBDP Take 75 mg by mouth as needed for up to 1 dose (Maximum 1 tablet in 24 hours.).   No facility-administered encounter medications on file as of 12/23/2019.    Allergies: Allergies  Allergen Reactions  . Erythromycin Anaphylaxis and Nausea And Vomiting  . Nalbuphine Other  (See Comments)    States loss of consciousness   . Cefdinir Diarrhea, Nausea And Vomiting and Rash  . Cephalexin Diarrhea and Nausea And Vomiting  . Cephalosporins Diarrhea and Nausea And Vomiting  . Hydrocodone-Acetaminophen Nausea And Vomiting  . Ketorolac Nausea And Vomiting  . Prochlorperazine Edisylate Other (See Comments)    uncontrollable shake - tremor   . Restasis [Cyclosporine] Other (See Comments)    migraines  . Tyloxapol Swelling  . Propranolol Diarrhea  . Topamax [Topiramate] Diarrhea and Nausea And Vomiting  . Zetia [Ezetimibe] Diarrhea  . Doxycycline Hyclate Nausea Only and Other (See Comments)    abd pain  . Iodinated Diagnostic Agents Other (See Comments)    Lightheaded, flushed feeling (explained to patient these are normal side effects of IV iodinated contrast, not allergies, but he wanted this noted in the epic; he tolerated epidural steroid injections w/ contrast w/o difficulty)  . Ketoconazole Itching  . Latex Itching  . Neosporin [Neomycin-Bacitracin Zn-Polymyx] Rash  . Oxycodone-Acetaminophen Other (See Comments)    Tylox caused constipation  . Prednisone Nausea And Vomiting    Oral route, only    Family History: Family History  Problem Relation Age of Onset  . Hypertension Mother   . Hypertension Brother   . Kidney failure Maternal Grandmother   . Prostate cancer Neg Hx   . Kidney cancer Neg Hx     Social History: Social History   Socioeconomic History  . Marital status: Married    Spouse name: Not on file  . Number of children: 0  . Years of education: Not on file  . Highest education level: Bachelor's degree (e.g., BA, AB, BS)  Occupational History  . Not on file  Tobacco Use  . Smoking status: Never Smoker  . Smokeless tobacco: Never Used  Vaping Use  . Vaping Use: Never used  Substance and Sexual Activity  . Alcohol use: No    Alcohol/week: 0.0 standard drinks  . Drug use: No  . Sexual activity: Yes  Partners: Female  Other  Topics Concern  . Not on file  Social History Narrative   Pt is married lives with spouse in 1 story apartment he has No children   BS degree- he is right handed   Drinks soda  Ginger ale, no coffee no tea   Social Determinants of Radio broadcast assistant Strain:   . Difficulty of Paying Living Expenses: Not on file  Food Insecurity:   . Worried About Charity fundraiser in the Last Year: Not on file  . Ran Out of Food in the Last Year: Not on file  Transportation Needs:   . Lack of Transportation (Medical): Not on file  . Lack of Transportation (Non-Medical): Not on file  Physical Activity:   . Days of Exercise per Week: Not on file  . Minutes of Exercise per Session: Not on file  Stress:   . Feeling of Stress : Not on file  Social Connections:   . Frequency of Communication with Friends and Family: Not on file  . Frequency of Social Gatherings with Friends and Family: Not on file  . Attends Religious Services: Not on file  . Active Member of Clubs or Organizations: Not on file  . Attends Archivist Meetings: Not on file  . Marital Status: Not on file  Intimate Partner Violence:   . Fear of Current or Ex-Partner: Not on file  . Emotionally Abused: Not on file  . Physically Abused: Not on file  . Sexually Abused: Not on file    Observations/Objective:   There were no vitals taken for this visit. No acute distress.  Alert and oriented.  Speech fluent and not dysarthric.  Language intact.   Assessment and Plan:   Migraine without aura, without status migrainosus, not intractable  1.  For preventative management, Emgality 2.  For abortive therapy, Nurtec (Zofran for nausea) 3.  Limit use of pain relievers to no more than 2 days out of week to prevent risk of rebound or medication-overuse headache. 4.  Keep headache diary 5.  Exercise, hydration, caffeine cessation, sleep hygiene, monitor for and avoid triggers 6. Follow up 6 months.   Follow Up  Instructions:    -I discussed the assessment and treatment plan with the patient. The patient was provided an opportunity to ask questions and all were answered. The patient agreed with the plan and demonstrated an understanding of the instructions.   The patient was advised to call back or seek an in-person evaluation if the symptoms worsen or if the condition fails to improve as anticipated.   Dudley Major, DO

## 2019-12-24 DIAGNOSIS — R102 Pelvic and perineal pain: Secondary | ICD-10-CM | POA: Diagnosis not present

## 2019-12-24 DIAGNOSIS — N5082 Scrotal pain: Secondary | ICD-10-CM | POA: Diagnosis not present

## 2019-12-24 DIAGNOSIS — F4323 Adjustment disorder with mixed anxiety and depressed mood: Secondary | ICD-10-CM | POA: Diagnosis not present

## 2019-12-24 DIAGNOSIS — E559 Vitamin D deficiency, unspecified: Secondary | ICD-10-CM | POA: Diagnosis not present

## 2019-12-24 DIAGNOSIS — K3184 Gastroparesis: Secondary | ICD-10-CM | POA: Diagnosis not present

## 2019-12-24 DIAGNOSIS — F438 Other reactions to severe stress: Secondary | ICD-10-CM | POA: Diagnosis not present

## 2019-12-24 DIAGNOSIS — G43719 Chronic migraine without aura, intractable, without status migrainosus: Secondary | ICD-10-CM | POA: Diagnosis not present

## 2019-12-24 DIAGNOSIS — S46001A Unspecified injury of muscle(s) and tendon(s) of the rotator cuff of right shoulder, initial encounter: Secondary | ICD-10-CM | POA: Diagnosis not present

## 2019-12-24 DIAGNOSIS — R002 Palpitations: Secondary | ICD-10-CM | POA: Diagnosis not present

## 2019-12-24 DIAGNOSIS — E538 Deficiency of other specified B group vitamins: Secondary | ICD-10-CM | POA: Diagnosis not present

## 2019-12-24 DIAGNOSIS — D519 Vitamin B12 deficiency anemia, unspecified: Secondary | ICD-10-CM | POA: Diagnosis not present

## 2019-12-24 DIAGNOSIS — I471 Supraventricular tachycardia: Secondary | ICD-10-CM | POA: Diagnosis not present

## 2019-12-30 ENCOUNTER — Encounter: Payer: PPO | Admitting: Physical Therapy

## 2019-12-30 ENCOUNTER — Other Ambulatory Visit: Payer: Self-pay

## 2019-12-30 ENCOUNTER — Other Ambulatory Visit: Payer: PPO

## 2019-12-30 DIAGNOSIS — Z20822 Contact with and (suspected) exposure to covid-19: Secondary | ICD-10-CM | POA: Diagnosis not present

## 2019-12-31 LAB — NOVEL CORONAVIRUS, NAA: SARS-CoV-2, NAA: NOT DETECTED

## 2020-01-06 ENCOUNTER — Encounter: Payer: PPO | Admitting: Physical Therapy

## 2020-01-07 ENCOUNTER — Encounter: Payer: PPO | Admitting: Physical Therapy

## 2020-01-13 ENCOUNTER — Ambulatory Visit: Payer: PPO | Admitting: Physical Therapy

## 2020-01-14 ENCOUNTER — Telehealth: Payer: PPO | Admitting: Neurology

## 2020-01-20 ENCOUNTER — Encounter: Payer: PPO | Admitting: Physical Therapy

## 2020-01-20 ENCOUNTER — Other Ambulatory Visit: Payer: PPO

## 2020-01-20 DIAGNOSIS — N5082 Scrotal pain: Secondary | ICD-10-CM | POA: Diagnosis not present

## 2020-01-20 DIAGNOSIS — N50812 Left testicular pain: Secondary | ICD-10-CM | POA: Diagnosis not present

## 2020-01-20 DIAGNOSIS — M7541 Impingement syndrome of right shoulder: Secondary | ICD-10-CM | POA: Diagnosis not present

## 2020-01-20 DIAGNOSIS — M5413 Radiculopathy, cervicothoracic region: Secondary | ICD-10-CM | POA: Diagnosis not present

## 2020-01-20 DIAGNOSIS — R102 Pelvic and perineal pain: Secondary | ICD-10-CM | POA: Diagnosis not present

## 2020-01-20 DIAGNOSIS — G8929 Other chronic pain: Secondary | ICD-10-CM | POA: Diagnosis not present

## 2020-01-20 DIAGNOSIS — K3184 Gastroparesis: Secondary | ICD-10-CM | POA: Diagnosis not present

## 2020-01-20 DIAGNOSIS — R002 Palpitations: Secondary | ICD-10-CM | POA: Diagnosis not present

## 2020-01-20 DIAGNOSIS — I471 Supraventricular tachycardia: Secondary | ICD-10-CM | POA: Diagnosis not present

## 2020-01-20 DIAGNOSIS — M6289 Other specified disorders of muscle: Secondary | ICD-10-CM | POA: Diagnosis not present

## 2020-01-20 DIAGNOSIS — R800 Isolated proteinuria: Secondary | ICD-10-CM | POA: Diagnosis not present

## 2020-01-20 DIAGNOSIS — G43719 Chronic migraine without aura, intractable, without status migrainosus: Secondary | ICD-10-CM | POA: Diagnosis not present

## 2020-01-27 ENCOUNTER — Ambulatory Visit: Payer: PPO | Admitting: Physical Therapy

## 2020-01-28 ENCOUNTER — Ambulatory Visit: Payer: PPO | Attending: Family Medicine | Admitting: Physical Therapy

## 2020-01-28 ENCOUNTER — Other Ambulatory Visit: Payer: Self-pay

## 2020-01-28 DIAGNOSIS — M6281 Muscle weakness (generalized): Secondary | ICD-10-CM

## 2020-01-28 DIAGNOSIS — M25511 Pain in right shoulder: Secondary | ICD-10-CM | POA: Diagnosis not present

## 2020-01-28 NOTE — Therapy (Signed)
[facility] [address] [place], [name], [address] Phone: [phone]   Fax:  [phone]  Physical Therapy Treatment/Recertification   Patient Details  Name: William [name] MRN: [id number] Date of Birth: March 01, 1961 Referring Provider (PT): Dr. [name]   Encounter Date: 01/28/2020   PT End of Session - 01/28/20 1147    Visit Number 3    Date for PT Re-Evaluation 04/21/20    Authorization Type [employer]    PT Start Time 1058    PT Stop Time 1143    PT Time Calculation (min) 45 min    Activity Tolerance Patient tolerated treatment well;No increased pain           Past Medical History:  Diagnosis Date  . Allergy   . Anxiety   . Depression   . Gastroparesis   . Heart murmur   . Migraine headache   . Mitral valve prolapse   . SVT (supraventricular tachycardia) (HCC)     No past surgical history on file.  There were no vitals filed for this visit.   Subjective Assessment - 01/28/20 1100    Subjective I went back to Dr. Tamala Julian said I had a tear in shoulder.  I didn't know I had that.  I had one shot.  It's not as bad now.  I want to avoid surgery.    Pertinent History Dr. Tamala Julian "tear in rotator cuff" history of LBP;  anxiety,depression, migraines;  previous pt of BF [name], [place])    Diagnostic tests MRI "arthritis" and "pinching in neck"   per MEDICAL RECORD NUMBER stenosis L3-4, L4-5 and L5-S1    Patient Stated Goals relieve pain, get stronger; return to strengthening    Currently in Pain? Yes    Pain Score 5     Pain Location Shoulder    Pain Orientation Right    Pain Radiating Towards upper arm    Aggravating Factors  lifting gallon of milk or lifting something at the store. reach overhead              [facility] PT Assessment - 01/28/20 0001      Assessment   Medical Diagnosis Pain in Rt shoulder     Referring Provider (PT) Dr. [name]    Prior Therapy for low back        Observation/Other Assessments   Focus on Therapeutic Outcomes (FOTO)  41%      AROM   Right/Left Shoulder Right;Left    Right Shoulder Flexion 162 Degrees    Right Shoulder ABduction 167 Degrees    Right Shoulder Internal Rotation --   L1   Right Shoulder External Rotation 78 Degrees    Left Shoulder Flexion 162 Degrees    Left Shoulder ABduction 172 Degrees    Left Shoulder Internal Rotation --   T6   Left Shoulder External Rotation 78 Degrees    Cervical - Right Side Bend 35    Cervical - Left Side Bend 45    Cervical - Right Rotation 35    Cervical - Left Rotation 50      Strength   Right Shoulder Flexion 4+/5    Right Shoulder ABduction 4/5   pain   Right Shoulder Internal Rotation 4+/5    Right Shoulder External Rotation 4+/5      Palpation   Palpation comment tender supraspinatus      Hawkins-Kennedy test   Findings Positive    Side Right  Empty Can test   Findings Positive    Side Right      Lag time at 0 degrees   Findings Negative      Drop Arm test   Findings Negative    Comment strong but painful                          [facility] Adult PT Treatment/Exercise - 01/28/20 0001      Shoulder Exercises: Standing   Extension Strengthening;Right;15 reps;Theraband    Theraband Level (Shoulder Extension) Level 3 (Green)    Row Strengthening;Right;15 reps;Theraband    Theraband Level (Shoulder Row) Level 3 (Green)    Other Standing Exercises green band triceps extension       Shoulder Exercises: Isometric Strengthening   Flexion 5X5"    ABduction 5X5"                  PT Education - 01/28/20 1146    Education Details green band single arm row, band extension single; wall isometric flexion and abduction    Person(s) Educated Patient    Methods Explanation;Demonstration;Handout    Comprehension Returned demonstration;Verbalized understanding            PT Short Term Goals - 01/28/20 1900      PT SHORT TERM GOAL #1   Title Pt  will be independent with his initial HEP to promote healing    Time 6    Period Weeks    Status New    Target Date 03/10/20      PT SHORT TERM GOAL #2   Title The patient will report a 40% improvement in shoulder pain with reaching and lifting medium objects    Time 6    Period Weeks    Status New      PT SHORT TERM GOAL #3   Title Right shoulder internal rotation behind the back to T10 needed for dressing/ grooming    Time 6    Period Weeks    Status New             PT Long Term Goals - 01/28/20 1905      PT LONG TERM GOAL #1   Title Pt will be independent with his advanced HEP to improve ROM and  strength  with daily activity after d/c from PT.    Time 12    Period Weeks    Status New      PT LONG TERM GOAL #2   Title Pt will report  at least 60% improvement in Rt UE pain and use throughout the day.    Time 12    Period Weeks    Status New      PT LONG TERM GOAL #3   Title The patient will have grossly 5-/5 right shoulder strength needed for lifting    Time 12    Period Weeks    Status New      PT LONG TERM GOAL #4   Title FOTO functional outcome score improved from 46% limitation to 41%    Time 12    Period Weeks    Status New      PT LONG TERM GOAL #5   Title Pt will be able to reach overhead into a cabinet x10 reps without increase in Rt shoulder pain.    Time 12    Period Weeks    Status On-going  Plan - 01/28/20 1848    Clinical Impression Statement The patient returns after 5 weeks.  He states his shoulder was really painful and the doctor recommended holding PT temporarily.  He states Dr. Tamala Julian said he had a tear in his rotator cuff.  He had an injection with improved pain intensity since that time and is sleeping better.   He reports pain with lifting a gallon of milk or anything with weight, reaching overhead,  His right shoulder ROM is nearly symmetrical to left except for internal rotation limited to L1 process behind the  back.  Grossly 4+/5 glenohumeral and scapular strength but painful with resisted abduction and empty can flexion position.  Tender over supraspinatus.  +Hawkins Merrilyn Puma.  He would benefit from continued shoulder PT to address these deficits.    Personal Factors and Comorbidities Comorbidity 1;Comorbidity 2;Comorbidity 3+;Past/Current Experience    Comorbidities chronic history spinal pain, anxiety, depression, migraines    Examination-Activity Limitations Lift;Caring for Others    Examination-Participation Restrictions Interpersonal Relationship    Stability/Clinical Decision Making Stable/Uncomplicated    Rehab Potential Good    PT Frequency 1x / week    PT Duration 12 weeks    PT Treatment/Interventions ADLs/Self Care Home Management;Electrical Stimulation;Cryotherapy;Aquatic Therapy;Moist Heat;Traction;Ultrasound;Neuromuscular re-education;Therapeutic exercise;Therapeutic activities;Patient/family education;Manual techniques;Dry needling;Taping;Spinal Manipulations;Iontophoresis 4mg /ml Dexamethasone    PT Next Visit Plan posterior shoulder strengthening; try prone ITY;  deltoid strengthening    PT Home Exercise Plan [email or web address]    Consulted and Agree with Plan of Care Patient           Patient will benefit from skilled therapeutic intervention in order to improve the following deficits and impairments:  Decreased range of motion, Pain, Impaired perceived functional ability, Decreased strength, Postural dysfunction, Improper body mechanics, Increased muscle spasms  Visit Diagnosis: Right shoulder pain, unspecified chronicity - Plan: PT plan of care cert/re-cert  Muscle weakness (generalized) - Plan: PT plan of care cert/re-cert     Problem List Patient Active Problem List   Diagnosis Date Noted  . Right rotator cuff tear 12/18/2019  . Degenerative cervical disc 05/07/2019  . Left lumbar radiculopathy 10/10/2017  . Migraine without aura and without status migrainosus, not intractable  06/22/2017  . Vitamin D deficiency 05/28/2017  . Scrotal pain 05/28/2017  . Paroxysmal SVT (supraventricular tachycardia) ([place]) 05/28/2017  . Mild left inguinal hernia 11/23/2016  . Chronic pelvic pain in male 09/28/2016  . Abdominal pain, left lower quadrant 09/28/2016  . Isolated proteinuria without specific morphologic lesion 06/27/2016  . Left testicular pain 06/27/2016  . Acute left flank pain 06/27/2016  . Epididymitis, left 06/05/2016  . Pelvic floor dysfunction 04/01/2016  . GERD (gastroesophageal reflux disease) 10/13/2015  . Gastroparesis 10/13/2015  . Intractable chronic migraine without aura and without status migrainosus 12/15/2013  . Hyperlipidemia 04/04/2012  . Mitral valve disease 07/15/2009  . IRRITABLE BOWEL SYNDROME 07/15/2009  . Palpitations 06/28/2009   Ruben Im, PT 01/28/20 7:10 PM Phone: [phone] Fax: [phone] Alvera Singh 01/28/2020, 7:10 PM  [facility] [address], [place], [name], [address] Phone: [phone]   Fax:  [phone]  Name: Dash Cardarelli MRN: [id number] Date of Birth: 09-27-1960

## 2020-01-28 NOTE — Patient Instructions (Signed)
Access Code: [email or web address] URL: [email or web address] Date: 01/28/2020 Prepared by: Ruben Im  Exercises Standing Bilateral Low Shoulder Row with Anchored Resistance - 2 x daily - 7 x weekly - 2 sets - 10 reps Single Arm Shoulder Extension with Anchored Resistance - 2 x daily - 7 x weekly - 2 sets - 10 reps Standing Isometric Shoulder Flexion with Doorway - Arm Bent - 1 x daily - 7 x weekly - 2 sets - 10 reps Isometric Shoulder Abduction at Wall - 1 x daily - 7 x weekly - 2 sets - 10 reps

## 2020-02-02 ENCOUNTER — Ambulatory Visit: Payer: PPO | Admitting: Family Medicine

## 2020-02-02 ENCOUNTER — Telehealth: Payer: Self-pay | Admitting: Cardiovascular Disease

## 2020-02-02 NOTE — Progress Notes (Signed)
Cardiology Office Note  Date:  02/03/2020   ID:  William [name], DOB 1961-02-06, MRN [id number]  PCP:  Shawnee Knapp, MD  Cardiologist: Dr. Angelena Form, MD   Chief Complaint  Patient presents with   Follow-up   History of Present Illness: William [name] is a 59 y.o. male who presents for the evaluation of palpitations.   He has a hx of palpitations/PVCs, SVT, migraine headaches, mitral valve prolapse and irritable bowel syndrome who follows with Dr. Angelena Form.   He has been known to have PVCs in the past however has not tolerated beta-blockers well in the past. He was remotely treated with diltiazem. He had an echocardiogram and stress test in 2011 which were normal. Holter monitor in 2011 with no evidence of SVT, atrial fibrillation. In 2014 he was admitted to the hospital for the evaluation of chest pain at which time he underwent cardiology work-up which was negative and no evidence of PE on chest CTA and remained in sinus tachycardia on telemetry. Repeat echocardiogram showed normal LV function and size and repeat 48-hour monitor with normal sinus rhythm and rare PACs.  He was then seen in follow-up 02/2017 with persistent complaints of palpitations repeat monitor showed NSR and one run of SVT around 40 beats.  He was started on propanolol for his SVT by his PCP however this caused diarrhea.  He was then started on diltiazem CD 05/2017 but he is self discontinued and preferred short acting diltiazem for episodes of tachycardia.   At most recent follow-up he had persistent complaints of palpitations several times per week.  He was started on diltiazem CD 120 mg daily with as needed diltiazem for breakthrough palpitations.  He was also taught vagal maneuvers with ablation as an option if SVT is unable to be controlled with medications.  [name] called the office 02/02/20 with c/o palpitations for the last several weeks without chest pain or shortness of breath symptoms.  He is  here today for follow-up and reports that after he was seen by Dr. Angelena Form 05/2019 with initiation of long-acting diltiazem with as needed dosing he was well controlled until approximately 1 month ago.  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 He states he has been taking diltiazem 120 mg daily and has been taking up to 4x 30 mg tablets of short acting diltiazem to control with palpitations.  Last event monitor was 03/2017 therefore we discussed obtaining a 2-week ZIO monitor for full assessment and referral to EP.  He is also willing to uptitrate his long-acting diltiazem.   Past Medical History:  Diagnosis Date   Allergy    Anxiety    Depression    Gastroparesis    Heart murmur    Migraine headache    Mitral valve prolapse    SVT (supraventricular tachycardia) ([facility])     History reviewed. No pertinent surgical history.   Current Outpatient Medications  Medication Sig Dispense Refill   baclofen (LIORESAL) 10 MG tablet Take 10 mg by mouth 3 (three) times daily.     diltiazem (CARDIZEM CD) 120 MG 24 hr capsule Take 1 capsule (120 mg total) by mouth daily. 90 capsule 1   diltiazem (CARDIZEM) 30 MG tablet TAKE (1) TABLET AS NEEDED FOR PALPITATIONS 30 tablet 5   EMGALITY 120 MG/ML SOAJ      fluticasone (FLONASE) 50 MCG/ACT nasal spray Place 2  sprays into both nostrils daily. 16 g 6   ondansetron (ZOFRAN) 4 MG tablet Take 4 mg by mouth as needed.      Rimegepant Sulfate (NURTEC) 75 MG TBDP Take 75 mg by mouth as needed for up to 1 dose (Maximum 1 tablet in 24 hours.). 8 tablet 11   No current facility-administered medications for this visit.    Allergies:   Erythromycin, Nalbuphine, Cefdinir, Cephalexin, Cephalosporins, Hydrocodone-acetaminophen, Ketorolac, Prochlorperazine edisylate, Restasis [cyclosporine], Tyloxapol, Propranolol, Topamax [topiramate], Zetia [ezetimibe], Doxycycline  hyclate, Iodinated diagnostic agents, Ketoconazole, Latex, Neosporin [neomycin-bacitracin zn-polymyx], Oxycodone-acetaminophen, and Prednisone    Social History:  The patient  reports that he has never smoked. He has never used smokeless tobacco. He reports that he does not drink alcohol and does not use drugs.   Family History:  The patient's family history includes Hypertension in his brother and mother; Kidney failure in his maternal grandmother.    ROS:  Please see the history of present illness.   Otherwise, review of systems are positive for none. All other systems are reviewed and negative.    PHYSICAL EXAM: VS:  There were no vitals taken for this visit. , BMI There is no height or weight on file to calculate BMI.   General: Well developed, well nourished, NAD Neck: Negative for carotid bruits. No JVD Lungs:Clear to ausculation bilaterally. No wheezes, rales, or rhonchi. Breathing is unlabored. Cardiovascular: RRR with S1 S2. No murmurs Extremities: No edema.  Neuro: Alert and oriented. No focal deficits. No facial asymmetry. MAE spontaneously. Psych: Responds to questions appropriately with normal affect.     EKG:  EKG is ordered today. The ekg ordered today demonstrates NSR with HR 72bpm   Recent Labs: No results found for requested labs within last 8760 hours.    Lipid Panel    Component Value Date/Time   CHOL 233 (H) 12/29/2016 1036   TRIG 157 (H) 12/29/2016 1036   HDL 52 12/29/2016 1036   CHOLHDL 4.5 12/29/2016 1036   CHOLHDL 3.7 05/14/2015 0958   VLDL 25 05/14/2015 0958   LDLCALC 150 (H) 12/29/2016 1036     Wt Readings from Last 3 Encounters:  12/18/19 147 lb (66.7 kg)  07/08/19 160 lb (72.6 kg)  05/07/19 164 lb 6.4 oz (74.6 kg)    Other studies Reviewed: Additional studies/ records that were reviewed today include:  Review of the above records demonstrates:   Echocardiogram 07/10/2012:  Study Conclusions   Left ventricle: The cavity size was  normal. Wall thickness  was normal. Systolic function was normal. The estimated  ejection fraction was in the range of 60% to 65%. Wall  motion was normal; there were no regional wall motion  abnormalities. Left ventricular diastolic function  parameters were normal.     ASSESSMENT AND PLAN:  1.  SVT: -Has been followed by Dr. Angelena Form for quite some time for palpitations/SVT.  He wore an event monitor 04/2017 which showed a 40 beat run of SVT at which time diltiazem CD was initiated however patient self discontinued. He was last seen by Dr. Angelena Form 05/07/2019 with more frequent palpitations with plans to restart long-acting diltiazem and short acting diltiazem as needed for breakthrough palpitations.  Plan was for possible EP referral for discussion regarding SVT ablation if necessary -Obtain Zio monitor  -Up-titrate Diltiazem to 180mg  PO QD along with PRN short acting dosing. Watch BP closely -Refer to EP for possible SVT ablation   Current medicines are reviewed at length with the patient today.  The patient does not have concerns regarding medicines.  The following changes have been made:  Increase Diltiazem to 180mg  PO QD  Labs/ tests ordered today include: NOne  No orders of the defined types were placed in this encounter.   Disposition:   FU with Dr. Angelena Form or myself in 3 months  Signed, Kathyrn Drown, NP  02/03/2020 8:26 AM    [address], [place], [place]  [address] Phone: [phone]; Fax: [phone]

## 2020-02-02 NOTE — Progress Notes (Deleted)
William [name] Phone: [phone] Subjective:    I'm seeing this patient by the request  of:  Shawnee Knapp, MD  CC: right shoulder pain follow up   ALP:[id number]  Syair Fricker is a 59 y.o. male coming in with complaint of back and neck pain. Injected shoulder 12/18/2019. Patient states   Medications patient has been prescribed:   Taking:    Patient was seen previously and was found to have more rotator cuff tear of the right shoulder and given an injection on December 18, 2019 has been doing formal physical therapy.     Reviewed prior external information including notes and imaging from previsou exam, outside providers and external EMR if available.   As well as notes that were available from care everywhere and other healthcare systems.  Past medical history, social, surgical and family history all reviewed in electronic medical record.  No pertanent information unless stated regarding to the chief complaint.   Past Medical History:  Diagnosis Date  . Allergy   . Anxiety   . Depression   . Gastroparesis   . Heart murmur   . Migraine headache   . Mitral valve prolapse   . SVT (supraventricular tachycardia) ([facility])     Allergies  Allergen Reactions  . Erythromycin Anaphylaxis and Nausea And Vomiting  . Nalbuphine Other (See Comments)    States loss of consciousness   . Cefdinir Diarrhea, Nausea And Vomiting and Rash  . Cephalexin Diarrhea and Nausea And Vomiting  . Cephalosporins Diarrhea and Nausea And Vomiting  . Hydrocodone-Acetaminophen Nausea And Vomiting  . Ketorolac Nausea And Vomiting  . Prochlorperazine Edisylate Other (See Comments)    uncontrollable shake - tremor   . Restasis [Cyclosporine] Other (See Comments)    migraines  . Tyloxapol Swelling  . Propranolol Diarrhea  . Topamax [Topiramate] Diarrhea and Nausea And Vomiting  . Zetia [Ezetimibe] Diarrhea  . Doxycycline  Hyclate Nausea Only and Other (See Comments)    abd pain  . Iodinated Diagnostic Agents Other (See Comments)    Lightheaded, flushed feeling (explained to patient these are normal side effects of IV iodinated contrast, not allergies, but he wanted this noted in the epic; he tolerated epidural steroid injections w/ contrast w/o difficulty)  . Ketoconazole Itching  . Latex Itching  . Neosporin [Neomycin-Bacitracin Zn-Polymyx] Rash  . Oxycodone-Acetaminophen Other (See Comments)    Tylox caused constipation  . Prednisone Nausea And Vomiting    Oral route, only     Review of Systems:  No headache, visual changes, nausea, vomiting, diarrhea, constipation, dizziness, abdominal pain, skin rash, fevers, chills, night sweats, weight loss, swollen lymph nodes, body aches, joint swelling, chest pain, shortness of breath, mood changes. POSITIVE muscle aches  Objective  There were no vitals taken for this visit.   General: No apparent distress alert and oriented x3 mood and affect normal, dressed appropriately.  HEENT: Pupils equal, extraocular movements intact  Respiratory: Patient's speak in full sentences and does not appear short of breath  Cardiovascular: No lower extremity edema, non tender, no erythema  Neuro: Cranial nerves II through XII are intact, neurovascularly intact in all extremities with 2+ DTRs and 2+ pulses.  Gait normal with good balance and coordination.  MSK:            Assessment and Plan:         The above documentation has been reviewed and is accurate and complete Alroy Dust  Koren Bound, DO       Note: This dictation was prepared with Dragon dictation along with smaller phrase technology. Any transcriptional errors that result from this process are unintentional.

## 2020-02-02 NOTE — Telephone Encounter (Signed)
Left message to call office

## 2020-02-02 NOTE — Telephone Encounter (Signed)
Patient c/o Palpitations:  High priority if patient c/o lightheadedness, shortness of breath, or chest pain  1) How long have you had palpitations/irregular HR/ Afib? Are you having the symptoms now? Couple weeks, no symptoms now  2) Are you currently experiencing lightheadedness, SOB or CP? no  3) Do you have a history of afib (atrial fibrillation) or irregular heart rhythm? yes  4) Have you checked your BP or HR? (document readings if available): no, does not have a way to check it  5) Are you experiencing any other symptoms? no   Patient states he has been having palpitations for the last couple of weeks. He is scheduled 02/03/2020 with Ermalinda Barrios.

## 2020-02-03 ENCOUNTER — Ambulatory Visit: Payer: PPO | Admitting: Physician Assistant

## 2020-02-03 ENCOUNTER — Ambulatory Visit (INDEPENDENT_AMBULATORY_CARE_PROVIDER_SITE_OTHER): Payer: PPO | Admitting: Cardiology

## 2020-02-03 ENCOUNTER — Other Ambulatory Visit: Payer: Self-pay | Admitting: Cardiology

## 2020-02-03 ENCOUNTER — Other Ambulatory Visit: Payer: Self-pay

## 2020-02-03 ENCOUNTER — Encounter: Payer: Self-pay | Admitting: Cardiology

## 2020-02-03 ENCOUNTER — Telehealth: Payer: Self-pay | Admitting: Radiology

## 2020-02-03 VITALS — BP 92/68 | HR 72 | Ht 70.0 in | Wt 143.0 lb

## 2020-02-03 DIAGNOSIS — I471 Supraventricular tachycardia: Secondary | ICD-10-CM

## 2020-02-03 DIAGNOSIS — R002 Palpitations: Secondary | ICD-10-CM | POA: Diagnosis not present

## 2020-02-03 MED ORDER — DILTIAZEM HCL ER COATED BEADS 180 MG PO CP24: 180.0000 mg | ORAL_CAPSULE | Freq: Every day | ORAL | 3 refills | Status: DC

## 2020-02-03 NOTE — Patient Instructions (Addendum)
Medication Instructions:   1. Increase the diltiazem (cardizem cd) to 180 mg, take one tablet by mouth daily.  *If you need a refill on your cardiac medications before your next appointment, please call your pharmacy*   Lab Work: None If you have labs (blood work) drawn today and your tests are completely normal, you will receive your results only by:  [place] (if you have MyChart) OR  A paper copy in the mail If you have any lab test that is abnormal or we need to change your treatment, we will call you to review the results.   Testing/Procedures: None   Follow-Up: At [facility], you and your health needs are our priority.  As part of our continuing mission to provide you with exceptional heart care, we have created designated Provider Care Teams.  These Care Teams include your primary Cardiologist (physician) and Advanced Practice Providers (APPs -  Physician Assistants and Nurse Practitioners) who all work together to provide you with the care you need, when you need it.   Your next appointment:   2-3 month(s)  The format for your next appointment:   In Person  Provider:   Lauree Chandler, MD or Kathyrn Drown, NP   Other Instructions Bryn Gulling- Long Term Monitor Instructions   Your physician has requested you wear your ZIO patch monitor___14____days.   This is a single patch monitor.  [employer] supplies one patch monitor per enrollment.  Additional stickers are not available.   Please do not apply patch if you will be having a Nuclear Stress Test, Echocardiogram, Cardiac CT, MRI, or Chest Xray during the time frame you would be wearing the monitor. The patch cannot be worn during these tests.  You cannot remove and re-apply the ZIO XT patch monitor.   Your ZIO patch monitor will be sent USPS Priority mail from [facility] directly to your home address. The monitor may also be mailed to a PO BOX if home delivery is not available.   It may take 3-5  days to receive your monitor after you have been enrolled.   Once you have received you monitor, please review enclosed instructions.  Your monitor has already been registered assigning a specific monitor serial # to you.   Applying the monitor   Shave hair from upper left chest.   Hold abrader disc by orange tab.  Rub abrader in 40 strokes over left upper chest as indicated in your monitor instructions.   Clean area with 4 enclosed alcohol pads .  Use all pads to assure are is cleaned thoroughly.  Let dry.   Apply patch as indicated in monitor instructions.  Patch will be place under collarbone on left side of chest with arrow pointing upward.   Rub patch adhesive wings for 2 minutes.Remove white label marked "1".  Remove white label marked "2".  Rub patch adhesive wings for 2 additional minutes.   While looking in a mirror, press and release button in center of patch.  A small green light will flash 3-4 times .  This will be your only indicator the monitor has been turned on.     Do not shower for the first 24 hours.  You may shower after the first 24 hours.   Press button if you feel a symptom. You will hear a small click.  Record Date, Time and Symptom in the Patient Log Book.   When you are ready to remove patch, follow instructions on last 2 pages of Patient  Log Book.  Stick patch monitor onto last page of Patient Log Book.   Place Patient Log Book in [place] box.  Use locking tab on box and tape box closed securely.  The [employer] has [employer] on it.  Please place in mailbox as soon as possible.  Your physician should have your test results approximately 7 days after the monitor has been mailed back to [facility].   Call [facility] at [phone] if you have questions regarding your ZIO XT patch monitor.  Call them immediately if you see an orange light blinking on your monitor.   If your monitor falls off in less than 4 days contact our  Monitor department at [phone].  If your monitor becomes loose or falls off after 4 days call [employer] at [phone] for suggestions on securing your monitor.   You have been referred to Dr Curt Bears in our EP department. You will be receiving a call from his scheduler to set this appointment up.

## 2020-02-03 NOTE — Telephone Encounter (Signed)
Seen today by APP.

## 2020-02-03 NOTE — Addendum Note (Signed)
Addended by: Juventino Slovak on: 02/03/2020 10:48 AM   Modules accepted: Orders

## 2020-02-03 NOTE — Telephone Encounter (Signed)
Enrolled patient for a 14 day Zio XT  monitor to be mailed to patients home  °

## 2020-02-04 ENCOUNTER — Ambulatory Visit (INDEPENDENT_AMBULATORY_CARE_PROVIDER_SITE_OTHER): Payer: PPO | Admitting: Family Medicine

## 2020-02-04 ENCOUNTER — Encounter: Payer: Self-pay | Admitting: Family Medicine

## 2020-02-04 VITALS — BP 100/70 | HR 78 | Ht 70.0 in | Wt 146.0 lb

## 2020-02-04 DIAGNOSIS — G8929 Other chronic pain: Secondary | ICD-10-CM

## 2020-02-04 DIAGNOSIS — M75111 Incomplete rotator cuff tear or rupture of right shoulder, not specified as traumatic: Secondary | ICD-10-CM

## 2020-02-04 DIAGNOSIS — M25511 Pain in right shoulder: Secondary | ICD-10-CM | POA: Diagnosis not present

## 2020-02-04 NOTE — Progress Notes (Signed)
[facility] [address] [place] Phone: [phone] Subjective:   I William [name] am serving as a Education administrator for Dr. Hulan Saas.  This visit occurred during the SARS-CoV-2 public health emergency.  Safety protocols were in place, including screening questions prior to the visit, additional usage of staff PPE, and extensive cleaning of exam room while observing appropriate contact time as indicated for disinfecting solutions.   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  No true injury that has been stated though.  Patient does have degenerative disc disease of the cervical spine that is also likely contributing and we can consider the possibility of an epidural.  Multiple different allergies to medications the patient wants to hold on any new medications but will restart formal physical therapy.  I am hopeful that patient does well with this conservative therapy and will see me again in 6 to 7 weeks.  Update 02/04/2020 William [name] is a 59 y.o. male coming in with complaint of back and neck pain. Injected shoulder 12/18/2019. Patient states he started PT and has some questions about exercises. Wants injection and wants to know if he should go to surgery.   Medications patient has been prescribed: Baclofen has been prescribed by primary care provider.     Patient was seen previously and was found to have more rotator cuff tear of the right shoulder and given an injection on December 18, 2019 has been doing formal physical therapy.     Reviewed prior external information including notes and imaging from previsou exam, outside providers and external EMR if available.   As well as notes that were available from care everywhere and other healthcare systems.  Past  medical history, social, surgical and family history all reviewed in electronic medical record.  No pertanent information unless stated regarding to the chief complaint.   Past Medical History:  Diagnosis Date  . Allergy   . Anxiety   . Depression   . Gastroparesis   . Heart murmur   . Migraine headache   . Mitral valve prolapse   . SVT (supraventricular tachycardia) ([facility])     Allergies  Allergen Reactions  . Erythromycin Anaphylaxis and Nausea And Vomiting  . Nalbuphine Other (See Comments)    States loss of consciousness   . Cefdinir Diarrhea, Nausea And Vomiting and Rash  . Cephalexin Diarrhea and Nausea And Vomiting  . Cephalosporins Diarrhea and Nausea And Vomiting  . Hydrocodone-Acetaminophen Nausea And Vomiting  . Ketorolac Nausea And Vomiting  . Prochlorperazine Edisylate Other (See Comments)    uncontrollable shake - tremor   . Restasis [Cyclosporine] Other (See Comments)    migraines  . Tyloxapol Swelling  . Propranolol Diarrhea  . Topamax [Topiramate] Diarrhea and Nausea And Vomiting  . Zetia [Ezetimibe] Diarrhea  . Doxycycline Hyclate Nausea Only and Other (See Comments)    abd pain  . Iodinated Diagnostic Agents Other (See Comments)    Lightheaded, flushed feeling (explained to patient these are normal side effects of IV iodinated contrast, not allergies, but he wanted this noted in the epic; he tolerated epidural steroid injections w/ contrast w/o difficulty)  . Ketoconazole Itching  . Latex Itching  . Neosporin [Neomycin-Bacitracin Zn-Polymyx] Rash  . Oxycodone-Acetaminophen Other (See Comments)  Tylox caused constipation  . Prednisone Nausea And Vomiting    Oral route, only     Review of Systems:  No headache, visual changes, nausea, vomiting, diarrhea, constipation, dizziness, abdominal pain, skin rash, fevers, chills, night sweats, weight loss, swollen lymph nodes, body aches, joint swelling, chest pain, shortness of breath, mood changes. POSITIVE  muscle aches  Objective  There were no vitals taken for this visit.   General: No apparent distress alert and oriented x3 mood and affect normal, dressed appropriately.  HEENT: Pupils equal, extraocular movements intact  Respiratory: Patient's speak in full sentences and does not appear short of breath  Cardiovascular: No lower extremity edema, non tender, no erythema  Neuro: Cranial nerves II through XII are intact, neurovascularly intact in all extremities with 2+ DTRs and 2+ pulses.  Gait normal with good balance and coordination.  MSK: Right shoulder exam still has some mild weakness with 4 out of 5 strength of the rotator cuff.  Positive impingement noted.  Negative O'Brien's.  Mild pain over the acromioclavicular joint.          Assessment and Plan:         The above documentation has been reviewed and is accurate and complete Lyndal Pulley, DO       Note: This dictation was prepared with Dragon dictation along with smaller phrase technology. Any transcriptional errors that result from this process are unintentional.

## 2020-02-04 NOTE — Assessment & Plan Note (Signed)
Patient had injection 2 months ago.  Has made some mild improvement with strength but continues to have pain on a daily basis.  Patient did go to physical therapy one time but secondary to the pain did discontinue at the moment.  I would like patient to follow through with an MRI.  Patient is considering it.  I would like him to do this because of the given the possibility of either surgical intervention or make sure the patient can do physical therapy.  Patient will decide on that.  Continue the same medicines including the baclofen.  Follow-up with me again after imaging to discuss.  No other medication changes secondary to other social determinants of health and allergies.  Patient is also having some difficulty getting to physical therapy secondary to his car.

## 2020-02-04 NOTE — Patient Instructions (Addendum)
Good to see you MRI right shoulder  After MRI we will discuss PT or surgery

## 2020-02-05 ENCOUNTER — Ambulatory Visit: Payer: PPO | Admitting: Physical Therapy

## 2020-02-08 ENCOUNTER — Telehealth: Payer: Self-pay | Admitting: Cardiology

## 2020-02-08 NOTE — Telephone Encounter (Signed)
Pt aware to wait and put on after MRI ./cy

## 2020-02-08 NOTE — Telephone Encounter (Signed)
The ZIO patch monitor is a one patch monitor.  It is not designed to be removed and reapplied.  We would recommend you do not apply the ZIO patch monitor until after your MRI on Friday.

## 2020-02-08 NOTE — Telephone Encounter (Signed)
New message   Pt said that he was to have a monitor delivered for him to wear. He is to have an MRI on his shoulder on Friday and said that he would have to take it off. He wants to know if he can take it off and put it back on?

## 2020-02-12 ENCOUNTER — Other Ambulatory Visit: Payer: Self-pay

## 2020-02-12 ENCOUNTER — Ambulatory Visit: Payer: PPO | Attending: Family Medicine | Admitting: Physical Therapy

## 2020-02-12 ENCOUNTER — Encounter: Payer: Self-pay | Admitting: Physical Therapy

## 2020-02-12 DIAGNOSIS — M25511 Pain in right shoulder: Secondary | ICD-10-CM | POA: Insufficient documentation

## 2020-02-12 DIAGNOSIS — M6281 Muscle weakness (generalized): Secondary | ICD-10-CM | POA: Diagnosis not present

## 2020-02-12 NOTE — Therapy (Signed)
[facility] [address] [place], [name], [address] Phone: [phone]   Fax:  [phone]  Physical Therapy Treatment  Patient Details  Name: William [name] MRN: [id number] Date of Birth: June 23, 1960 Referring Provider (PT): Dr. [name]   Encounter Date: 02/12/2020   PT End of Session - 02/12/20 0849    Visit Number 4    Date for PT Re-Evaluation 04/21/20    Authorization Type [employer]    PT Start Time 0845    Activity Tolerance Patient tolerated treatment well;No increased pain    Behavior During Therapy WFL for tasks assessed/performed           Past Medical History:  Diagnosis Date  . Allergy   . Anxiety   . Depression   . Gastroparesis   . Heart murmur   . Migraine headache   . Mitral valve prolapse   . SVT (supraventricular tachycardia) ([place])     History reviewed. No pertinent surgical history.  There were no vitals filed for this visit.   Subjective Assessment - 02/12/20 0850    Subjective Having MRI tomorrow on shoulder. Constant deep ache in that upper arm.    Pertinent History Dr. Tamala Julian "tear in rotator cuff" history of LBP;  anxiety,depression, migraines;  previous pt of BF [name], [place])    Currently in Pain? Yes    Pain Score 5     Pain Location Arm    Pain Orientation Right    Pain Descriptors / Indicators Constant;Sore;Aching    Aggravating Factors  lifting, reaching    Pain Relieving Factors Not much, not using    Multiple Pain Sites No                             [facility] Adult PT Treatment/Exercise - 02/12/20 0001      Self-Care   Self-Care Other Self-Care Comments    Other Self-Care Comments  Use of home TENS unit, how  long to wear and pain reduction parameters. PTA verbally ed, pt verbally understood      Moist Heat Therapy   Number Minutes Moist Heat 20 Minutes    Moist Heat Location Shoulder      Electrical Stimulation   Electrical  Stimulation Location Rt shoulder and upper arm    Electrical Stimulation Action IFC    Electrical Stimulation Parameters 80-150 HZ    Electrical Stimulation Goals Pain                    PT Short Term Goals - 01/28/20 1900      PT SHORT TERM GOAL #1   Title Pt will be independent with his initial HEP to promote healing    Time 6    Period Weeks    Status New    Target Date 03/10/20      PT SHORT TERM GOAL #2   Title The patient will report a 40% improvement in shoulder pain with reaching and lifting medium objects    Time 6    Period Weeks    Status New      PT SHORT TERM GOAL #3   Title Right shoulder internal rotation behind the back to T10 needed for dressing/ grooming    Time 6    Period Weeks    Status New             PT Long Term Goals - 01/28/20 1905  PT LONG TERM GOAL #1   Title Pt will be independent with his advanced HEP to improve ROM and  strength  with daily activity after d/c from PT.    Time 12    Period Weeks    Status New      PT LONG TERM GOAL #2   Title Pt will report  at least 60% improvement in Rt UE pain and use throughout the day.    Time 12    Period Weeks    Status New      PT LONG TERM GOAL #3   Title The patient will have grossly 5-/5 right shoulder strength needed for lifting    Time 12    Period Weeks    Status New      PT LONG TERM GOAL #4   Title FOTO functional outcome score improved from 46% limitation to 41%    Time 12    Period Weeks    Status New      PT LONG TERM GOAL #5   Title Pt will be able to reach overhead into a cabinet x10 reps without increase in Rt shoulder pain.    Time 12    Period Weeks    Status On-going                 Plan - 02/12/20 0849    Clinical Impression Statement Pt presents with continued complaints of Rt shoulder and upper pain/deep ache. This worsens with use especially lifting anything. He continues to do his HEP that was given to him last session although it is  painful to do. No exercises were performrd today nor given for more HEp as pt will have MRI tomoorow on his shoulder and see either Dr Tamala Julian and/or Ortho MD is surgery is warranted. PTA suggested pt look into buying a TENS unit to aide in pain managment in the meantime. Applied Estim today to RT shoulder for pain management.    Personal Factors and Comorbidities Comorbidity 1;Comorbidity 2;Comorbidity 3+;Past/Current Experience    Comorbidities chronic history spinal pain, anxiety, depression, migraines    Examination-Activity Limitations Lift;Caring for Others    Examination-Participation Restrictions Interpersonal Relationship    Stability/Clinical Decision Making Stable/Uncomplicated    Rehab Potential Good    PT Frequency 1x / week    PT Duration 12 weeks    PT Treatment/Interventions ADLs/Self Care Home Management;Electrical Stimulation;Cryotherapy;Aquatic Therapy;Moist Heat;Traction;Ultrasound;Neuromuscular re-education;Therapeutic exercise;Therapeutic activities;Patient/family education;Manual techniques;Dry needling;Taping;Spinal Manipulations;Iontophoresis 4mg /ml Dexamethasone    PT Next Visit Plan See what MRI results are and what course of action MD suggests.    PT Home Exercise Plan [id number]    Consulted and Agree with Plan of Care Patient           Patient will benefit from skilled therapeutic intervention in order to improve the following deficits and impairments:  Decreased range of motion, Pain, Impaired perceived functional ability, Decreased strength, Postural dysfunction, Improper body mechanics, Increased muscle spasms  Visit Diagnosis: Right shoulder pain, unspecified chronicity  Muscle weakness (generalized)     Problem List Patient Active Problem List   Diagnosis Date Noted  . Right rotator cuff tear 12/18/2019  . Degenerative cervical disc 05/07/2019  . Left lumbar radiculopathy 10/10/2017  . Migraine without aura and without status migrainosus, not  intractable 06/22/2017  . Vitamin D deficiency 05/28/2017  . Scrotal pain 05/28/2017  . Paroxysmal SVT (supraventricular tachycardia) ([place]) 05/28/2017  . Mild left inguinal hernia 11/23/2016  . Chronic pelvic pain in male 09/28/2016  .  Abdominal pain, left lower quadrant 09/28/2016  . Isolated proteinuria without specific morphologic lesion 06/27/2016  . Left testicular pain 06/27/2016  . Acute left flank pain 06/27/2016  . Epididymitis, left 06/05/2016  . Pelvic floor dysfunction 04/01/2016  . GERD (gastroesophageal reflux disease) 10/13/2015  . Gastroparesis 10/13/2015  . Intractable chronic migraine without aura and without status migrainosus 12/15/2013  . Hyperlipidemia 04/04/2012  . Mitral valve disease 07/15/2009  . IRRITABLE BOWEL SYNDROME 07/15/2009  . Palpitations 06/28/2009    Deeanna Beightol, PTA 02/12/2020, 9:13 AM  [facility] [address], [place], [name], [address] Phone: [phone]   Fax:  [phone]  Name: William [name] MRN: [id number] Date of Birth: Jun 05, 1960

## 2020-02-13 ENCOUNTER — Ambulatory Visit (HOSPITAL_BASED_OUTPATIENT_CLINIC_OR_DEPARTMENT_OTHER)
Admission: RE | Admit: 2020-02-13 | Discharge: 2020-02-13 | Disposition: A | Discharge status: Home / Self Care | Payer: PPO | Source: Ambulatory Visit | Attending: Family Medicine | Admitting: Family Medicine

## 2020-02-13 ENCOUNTER — Encounter: Payer: Self-pay | Admitting: Family Medicine

## 2020-02-13 DIAGNOSIS — G8929 Other chronic pain: Secondary | ICD-10-CM | POA: Diagnosis not present

## 2020-02-13 DIAGNOSIS — M25511 Pain in right shoulder: Secondary | ICD-10-CM | POA: Insufficient documentation

## 2020-02-16 ENCOUNTER — Telehealth: Payer: Self-pay | Admitting: Neurology

## 2020-02-16 ENCOUNTER — Telehealth: Payer: Self-pay | Admitting: Cardiology

## 2020-02-16 NOTE — Telephone Encounter (Signed)
Pt has Medicare insurance.   Can pt come get samples.

## 2020-02-16 NOTE — Telephone Encounter (Signed)
Please change medication administration to evening dose starting tomorrow (if possible), monitor blood pressure and pulse daily and keep records.

## 2020-02-16 NOTE — Telephone Encounter (Signed)
Pt began his increased Diltiazem CD 180mg  at 6am.  Pt complaining of dizziness and lightheadedness after taking medication but reorts this has improved slightly.  Current BP 121/76.  Pt advised BP WNL and symptoms he is experiencing can be a side effect.  Pt advised to change positions slowly.  Reviewed ED precautions.  Will forward information for review and recommendations.  Pt verbalized understanding and agrees with current plan.

## 2020-02-16 NOTE — Telephone Encounter (Signed)
Patient called and said, "I am out of Nurtec 75 MG but I am in the doughnut hole for coverage so I have to pay in full for it. Dr. Tomi Likens told me to call for samples if I need them and I do need them. I try not to ask very often, is there any chance I can get some samples of Nurtec?"

## 2020-02-16 NOTE — Telephone Encounter (Signed)
     Pt would like to speak with a nurse he said he have some questions about meds and procedure

## 2020-02-16 NOTE — Telephone Encounter (Signed)
LMOVM samples at front desk.

## 2020-02-16 NOTE — Telephone Encounter (Signed)
Spoke with pt and advised per PharmD recommends pt change dosing to evening beginning tomorrow.  Continue monitoring BP and logging.  Pt verbalizes understanding and agrees with current plan.  Pt also reports he is having receiving PT and will be wearing a TENS unit and wants to know if this will interfere with him wearing monitor.  Will forward information for review and recommendation.  Pt verbalizes understanding and agrees with current plan.

## 2020-02-16 NOTE — Telephone Encounter (Signed)
Yes, we can give him samples.

## 2020-02-17 ENCOUNTER — Other Ambulatory Visit: Payer: Self-pay

## 2020-02-17 ENCOUNTER — Encounter: Payer: Self-pay | Admitting: Physical Therapy

## 2020-02-17 ENCOUNTER — Ambulatory Visit: Payer: PPO | Admitting: Physical Therapy

## 2020-02-17 ENCOUNTER — Telehealth: Payer: Self-pay | Admitting: *Deleted

## 2020-02-17 DIAGNOSIS — M25511 Pain in right shoulder: Secondary | ICD-10-CM | POA: Diagnosis not present

## 2020-02-17 DIAGNOSIS — M6281 Muscle weakness (generalized): Secondary | ICD-10-CM

## 2020-02-17 NOTE — Progress Notes (Signed)
[place] Phone: [phone] Subjective:   [name], am serving as a scribe for Dr. Hulan Saas. This visit occurred during the SARS-CoV-2 public health emergency.  Safety protocols were in place, including screening questions prior to the visit, additional usage of staff PPE, and extensive cleaning of exam room while observing appropriate contact time as indicated for disinfecting solutions.   I'm seeing this patient by the request  of:  Shawnee Knapp, MD  CC: Right shoulder pain follow-up  LEX:[id number]   02/24/2020 Patient had injection 2 months ago.  Has made some mild improvement with strength but continues to have pain on a daily basis.  Patient did go to physical therapy one time but secondary to the pain did discontinue at the moment.  I would like patient to follow through with an MRI.  Patient is considering it.  I would like him to do this because of the given the possibility of either surgical intervention or make sure the patient can do physical therapy.  Patient will decide on that.  Continue the same medicines including the baclofen.  Follow-up with me again after imaging to discuss.  No other medication changes secondary to other social determinants of health and allergies.  Patient is also having some difficulty getting to physical therapy secondary to his car.  Update 02/18/2020 Ramzey Petrovic is a 59 y.o. male coming in with complaint of right rotator cuff tear. Patient states that he started PT yesterday. Pain with horizontal abduction.  Patient states that the shoulder is feeling much better overall.  Not having as much weakness.  Patient is looking forward to something else  MRI right shoulder IMPRESSION: 1. Moderate supraspinatus tendinosis. No rotator cuff tear. 2. Mild acromioclavicular osteoarthritis.    Past Medical History:  Diagnosis Date  . Allergy   . Anxiety   .  Depression   . Gastroparesis   . Heart murmur   . Migraine headache   . Mitral valve prolapse   . SVT (supraventricular tachycardia) ([facility])    No past surgical history on file. Social History   Socioeconomic History  . Marital status: Married    Spouse name: Not on file  . Number of children: 0  . Years of education: Not on file  . Highest education level: Bachelor's degree (e.g., BA, AB, BS)  Occupational History  . Not on file  Tobacco Use  . Smoking status: Never Smoker  . Smokeless tobacco: Never Used  Vaping Use  . Vaping Use: Never used  Substance and Sexual Activity  . Alcohol use: No    Alcohol/week: 0.0 standard drinks  . Drug use: No  . Sexual activity: Yes    Partners: Female  Other Topics Concern  . Not on file  Social History Narrative   Pt is married lives with spouse in 1 story apartment he has No children   BS degree- he is right handed   Drinks soda  Ginger ale, no coffee no tea   Social Determinants of Radio broadcast assistant Strain:   . Difficulty of Paying Living Expenses: Not on file  Food Insecurity:   . Worried About Charity fundraiser in the Last Year: Not on file  . Ran Out of Food in the Last Year: Not on file  Transportation Needs:   . Lack of Transportation (Medical): Not on file  . Lack of Transportation (Non-Medical): Not on file  Physical Activity:   . Days of Exercise per Week: Not on file  . Minutes of Exercise per Session: Not on file  Stress:   . Feeling of Stress : Not on file  Social Connections:   . Frequency of Communication with Friends and Family: Not on file  . Frequency of Social Gatherings with Friends and Family: Not on file  . Attends Religious Services: Not on file  . Active Member of Clubs or Organizations: Not on file  . Attends Archivist Meetings: Not on file  . Marital Status: Not on file   Allergies  Allergen Reactions  . Erythromycin Anaphylaxis and Nausea And Vomiting  . Nalbuphine Other  (See Comments)    States loss of consciousness   . Cefdinir Diarrhea, Nausea And Vomiting and Rash  . Cephalexin Diarrhea and Nausea And Vomiting  . Cephalosporins Diarrhea and Nausea And Vomiting  . Hydrocodone-Acetaminophen Nausea And Vomiting  . Ketorolac Nausea And Vomiting  . Prochlorperazine Edisylate Other (See Comments)    uncontrollable shake - tremor   . Restasis [Cyclosporine] Other (See Comments)    migraines  . Tyloxapol Swelling  . Propranolol Diarrhea  . Topamax [Topiramate] Diarrhea and Nausea And Vomiting  . Zetia [Ezetimibe] Diarrhea  . Doxycycline Hyclate Nausea Only and Other (See Comments)    abd pain  . Iodinated Diagnostic Agents Other (See Comments)    Lightheaded, flushed feeling (explained to patient these are normal side effects of IV iodinated contrast, not allergies, but he wanted this noted in the epic; he tolerated epidural steroid injections w/ contrast w/o difficulty)  . Ketoconazole Itching  . Latex Itching  . Neosporin [Neomycin-Bacitracin Zn-Polymyx] Rash  . Oxycodone-Acetaminophen Other (See Comments)    Tylox caused constipation  . Prednisone Nausea And Vomiting    Oral route, only   Family History  Problem Relation Age of Onset  . Hypertension Mother   . Hypertension Brother   . Kidney failure Maternal Grandmother   . Prostate cancer Neg Hx   . Kidney cancer Neg Hx      Current Outpatient Medications (Cardiovascular):  .  diltiazem (CARDIZEM CD) 180 MG 24 hr capsule, Take 1 capsule (180 mg total) by mouth daily. [name]  diltiazem (CARDIZEM) 30 MG tablet, TAKE (1) TABLET AS NEEDED FOR PALPITATIONS  Current Outpatient Medications (Respiratory):  .  fluticasone (FLONASE) 50 MCG/ACT nasal spray, Place 2 sprays into both nostrils daily.  Current Outpatient Medications (Analgesics):  [name]  EMGALITY 120 MG/ML SOAJ,  .  Rimegepant Sulfate (NURTEC) 75 MG TBDP, Take 75 mg by mouth as needed for up to 1 dose (Maximum 1 tablet in 24  hours.).   Current Outpatient Medications (Other):  .  amoxicillin (AMOXIL) 500 MG capsule, Take 2,000 mg by mouth as needed. Pt takes 4 capsules prior to dental work. .  baclofen (LIORESAL) 10 MG tablet, Take 10 mg by mouth 3 (three) times daily. .  metoCLOPramide (REGLAN) 5 MG tablet, Take 5 mg by mouth as needed. .  ondansetron (ZOFRAN) 4 MG tablet, Take 4 mg by mouth as needed.    Reviewed prior external information including notes and imaging from  primary care provider As well as notes that were available from care everywhere and other healthcare systems.  Past medical history, social, surgical and family history all reviewed in electronic medical record.  No pertanent information unless stated regarding to the chief complaint.   Review of Systems:  No headache, visual changes, nausea, vomiting, diarrhea, constipation, dizziness,  abdominal pain, skin rash, fevers, chills, night sweats, weight loss, swollen lymph nodes, body aches, joint swelling, chest pain, shortness of breath, mood changes. POSITIVE muscle aches  Objective  Blood pressure 110/72, pulse 97, height 5\' 10"  (1.778 m), weight 146 lb (66.2 kg), SpO2 99 %.   General: No apparent distress alert and oriented x3 mood and affect normal, dressed appropriately.  HEENT: Pupils equal, extraocular movements intact  Respiratory: Patient's speak in full sentences and does not appear short of breath  Cardiovascular: No lower extremity edema, non tender, no erythema  Neuro: Cranial nerves II through XII are intact, neurovascularly intact in all extremities with 2+ DTRs and 2+ pulses.  Gait normal with good balance and coordination.  MSK: Right shoulder exam shows the patient does have some tenderness over the acromioclavicular joint.  Still has some loss of lordosis of the neck noted.  Patient does have positive impingement with positive crossover.  Procedure: Real-time Ultrasound Guided Injection of right acromioclavicular  joint Device: GE Logiq Q7 Ultrasound guided injection is preferred based studies that show increased duration, increased effect, greater accuracy, decreased procedural pain, increased response rate, and decreased cost with ultrasound guided versus blind injection.  Verbal informed consent obtained.  Time-out conducted.  Noted no overlying erythema, induration, or other signs of local infection.  Skin prepped in a sterile fashion.  Local anesthesia: Topical Ethyl chloride.  With sterile technique and under real time ultrasound guidance: With a 25-gauge half inch needle injected with 0.5 cc of 0.5% Marcaine and 0.5 cc of Kenalog 40 mg/mL Completed without difficulty  Pain immediately resolved suggesting accurate placement of the medication.  Advised to call if fevers/chills, erythema, induration, drainage, or persistent bleeding.  Impression: Technically successful ultrasound guided injection.    Impression and Recommendations:     The above documentation has been reviewed and is accurate and complete Lyndal Pulley, DO

## 2020-02-17 NOTE — Therapy (Signed)
[facility] [address], [place], [name], [address] Phone: [phone]   Fax:  [phone]  Physical Therapy Treatment  Patient Details  Name: William [name] MRN: [id number] Date of Birth: Sep 16, 1960 Referring Provider (PT): Dr. [name]   Encounter Date: 02/17/2020   PT End of Session - 02/17/20 1452    Visit Number 5    Date for PT Re-Evaluation 04/21/20    Authorization Type [employer]    PT Start Time 1448    PT Stop Time 1528    PT Time Calculation (min) 40 min    Activity Tolerance Patient tolerated treatment well;No increased pain    Behavior During Therapy WFL for tasks assessed/performed           Past Medical History:  Diagnosis Date   Allergy    Anxiety    Depression    Gastroparesis    Heart murmur    Migraine headache    Mitral valve prolapse    SVT (supraventricular tachycardia) ([place])     History reviewed. No pertinent surgical history.  There were no vitals filed for this visit.   Subjective Assessment - 02/17/20 1452    Subjective Patient had MRI and it shows no tear, but suprapinatus tendonosis and mild A/C arthritis    Pertinent History supraspinatus tendinosis; mild A/C arthritis;  history of LBP;  anxiety,depression, migraines;  previous pt of BF [name], [place])    Limitations Sitting;Standing;House hold activities    Diagnostic tests MRI "arthritis" and "pinching in neck"   per MEDICAL RECORD NUMBER stenosis L3-4, L4-5 and L5-S1    Patient Stated Goals relieve pain, get stronger; return to strengthening    Currently in Pain? Yes    Pain Score 4     Pain Location Shoulder    Pain Orientation Right    Pain Descriptors / Indicators Sore;Aching    Pain Type Chronic pain                             [facility] Adult PT Treatment/Exercise - 02/17/20 0001      Exercises   Exercises Shoulder      Shoulder Exercises: Prone   Horizontal ABduction 1  Right;20 reps    Horizontal ABduction 1 Weight (lbs) 2    Horizontal ABduction 2 Right;20 reps    Horizontal ABduction 2 Weight (lbs) 2      Shoulder Exercises: Sidelying   External Rotation Right;20 reps    External Rotation Weight (lbs) 2    External Rotation Limitations did 5 reps with 3# and started to hurt      Shoulder Exercises: Standing   Horizontal ABduction Both;15 reps    Theraband Level (Shoulder Horizontal ABduction) Level 3 (Green)    Extension Strengthening;Right;15 reps;Theraband    Theraband Level (Shoulder Extension) Level 3 (Green)    Row Strengthening;Right;15 reps;Theraband    Theraband Level (Shoulder Row) Level 3 (Green)    Theraband Level (Shoulder Diagonals) Level 3 (Green)    Diagonals Limitations painful on right      Shoulder Exercises: Isometric Strengthening   Flexion 5X10"    Extension 5X10"    External Rotation 5X10"    Internal Rotation 5X10"    ABduction 5X10"      Shoulder Exercises: Stretch   Other Shoulder Stretches doorway stretch at 90/90 3x30 sec      Modalities   Modalities Iontophoresis      Iontophoresis  Type of Iontophoresis Dexamethasone    Location Rt supraspinatus muscle    Dose 4mg /ml    Time 4-6 hour patch                    PT Short Term Goals - 02/17/20 1537      PT SHORT TERM GOAL #1   Title Pt will be independent with his initial HEP to promote healing    Status On-going             PT Long Term Goals - 01/28/20 1905      PT LONG TERM GOAL #1   Title Pt will be independent with his advanced HEP to improve ROM and  strength  with daily activity after d/c from PT.    Time 12    Period Weeks    Status New      PT LONG TERM GOAL #2   Title Pt will report  at least 60% improvement in Rt UE pain and use throughout the day.    Time 12    Period Weeks    Status New      PT LONG TERM GOAL #3   Title The patient will have grossly 5-/5 right shoulder strength needed for lifting    Time 12     Period Weeks    Status New      PT LONG TERM GOAL #4   Title FOTO functional outcome score improved from 46% limitation to 41%    Time 12    Period Weeks    Status New      PT LONG TERM GOAL #5   Title Pt will be able to reach overhead into a cabinet x10 reps without increase in Rt shoulder pain.    Time 12    Period Weeks    Status On-going                 Plan - 02/17/20 1523    Clinical Impression Statement Pt's MRI shows no tear, but suprapinatus tendonosis and mild A/C arthritis. He has been hesitant to do HEP until results of MRI. He tolerated all exercises today without pain except D2 flex with resistance. Trial of ionto to right shoulder today.    Personal Factors and Comorbidities Comorbidity 1;Comorbidity 2;Comorbidity 3+;Past/Current Experience    Comorbidities chronic history spinal pain, anxiety, depression, migraines    PT Frequency 1x / week    PT Duration 12 weeks    PT Treatment/Interventions ADLs/Self Care Home Management;Electrical Stimulation;Cryotherapy;Aquatic Therapy;Moist Heat;Traction;Ultrasound;Neuromuscular re-education;Therapeutic exercise;Therapeutic activities;Patient/family education;Manual techniques;Dry needling;Taping;Spinal Manipulations;Iontophoresis 4mg /ml Dexamethasone    PT Next Visit Plan Continue pain control and right shoulder strengthening.    PT Home Exercise Plan [email or web address]    Consulted and Agree with Plan of Care Patient           Patient will benefit from skilled therapeutic intervention in order to improve the following deficits and impairments:  Decreased range of motion, Pain, Impaired perceived functional ability, Decreased strength, Postural dysfunction, Improper body mechanics, Increased muscle spasms  Visit Diagnosis: Right shoulder pain, unspecified chronicity  Muscle weakness (generalized)     Problem List Patient Active Problem List   Diagnosis Date Noted   Right rotator cuff tear 12/18/2019   Degenerative  cervical disc 05/07/2019   Left lumbar radiculopathy 10/10/2017   Migraine without aura and without status migrainosus, not intractable 06/22/2017   Vitamin D deficiency 05/28/2017   Scrotal pain 05/28/2017   Paroxysmal SVT (supraventricular tachycardia) ([place])  05/28/2017   Mild left inguinal hernia 11/23/2016   Chronic pelvic pain in male 09/28/2016   Abdominal pain, left lower quadrant 09/28/2016   Isolated proteinuria without specific morphologic lesion 06/27/2016   Left testicular pain 06/27/2016   Acute left flank pain 06/27/2016   Epididymitis, left 06/05/2016   Pelvic floor dysfunction 04/01/2016   GERD (gastroesophageal reflux disease) 10/13/2015   Gastroparesis 10/13/2015   Intractable chronic migraine without aura and without status migrainosus 12/15/2013   Hyperlipidemia 04/04/2012   Mitral valve disease 07/15/2009   IRRITABLE BOWEL SYNDROME 07/15/2009   Palpitations 06/28/2009    Madelyn Flavors PT 02/17/2020, 3:38 PM  [facility] [address] [place], [name], [address] Phone: [phone]   Fax:  [phone]  Name: Maximos Zayas MRN: [id number] Date of Birth: [birth year]

## 2020-02-17 NOTE — Telephone Encounter (Signed)
Answered patients questions regarding 14 day ZIO XT long term holter monitor.  Cannot wear during  MRI.  Can wear during intermittent TENS treatments.  Patient states, he will contact office to reschedule consult with Dr. Curt Bears.  He wants Dr. Curt Bears to have monitor results at the time of his appointment.

## 2020-02-18 ENCOUNTER — Encounter: Payer: Self-pay | Admitting: Family Medicine

## 2020-02-18 ENCOUNTER — Ambulatory Visit: Payer: PPO | Admitting: Family Medicine

## 2020-02-18 ENCOUNTER — Ambulatory Visit: Payer: Self-pay

## 2020-02-18 VITALS — BP 110/72 | HR 97 | Ht 70.0 in | Wt 146.0 lb

## 2020-02-18 DIAGNOSIS — M25511 Pain in right shoulder: Secondary | ICD-10-CM | POA: Diagnosis not present

## 2020-02-18 DIAGNOSIS — M19011 Primary osteoarthritis, right shoulder: Secondary | ICD-10-CM | POA: Diagnosis not present

## 2020-02-18 DIAGNOSIS — G8929 Other chronic pain: Secondary | ICD-10-CM

## 2020-02-18 NOTE — Assessment & Plan Note (Signed)
Injection given today.  Tolerated the procedure well.  Discussed icing regimen and home exercise, which activities to doing which wants to avoid.  Increase activity slowly.  Follow-up again in 5 weeks.  Continue to have pain due to subacromial bursitis injection.

## 2020-02-18 NOTE — Patient Instructions (Signed)
Continue with PT See me in 5 weeks Will do shoulder injection then if needed

## 2020-02-19 ENCOUNTER — Encounter: Payer: PPO | Admitting: Physical Therapy

## 2020-02-19 ENCOUNTER — Other Ambulatory Visit (INDEPENDENT_AMBULATORY_CARE_PROVIDER_SITE_OTHER): Payer: PPO

## 2020-02-19 DIAGNOSIS — I471 Supraventricular tachycardia: Secondary | ICD-10-CM

## 2020-02-19 DIAGNOSIS — R002 Palpitations: Secondary | ICD-10-CM

## 2020-02-23 ENCOUNTER — Institutional Professional Consult (permissible substitution): Payer: PPO | Admitting: Cardiology

## 2020-02-26 ENCOUNTER — Ambulatory Visit: Payer: PPO | Admitting: Physical Therapy

## 2020-02-26 ENCOUNTER — Other Ambulatory Visit: Payer: PPO

## 2020-02-26 ENCOUNTER — Other Ambulatory Visit: Payer: Self-pay

## 2020-02-26 DIAGNOSIS — M25511 Pain in right shoulder: Secondary | ICD-10-CM | POA: Diagnosis not present

## 2020-02-26 DIAGNOSIS — M6281 Muscle weakness (generalized): Secondary | ICD-10-CM

## 2020-02-26 NOTE — Patient Instructions (Signed)
Access Code: [email or web address] URL: [email or web address] Date: 02/26/2020 Prepared by: Ruben Im  Exercises Standing Bilateral Low Shoulder Row with Anchored Resistance - 2 x daily - 7 x weekly - 2 sets - 10 reps Single Arm Shoulder Extension with Anchored Resistance - 2 x daily - 7 x weekly - 2 sets - 10 reps Standing Diagonal Shoulder Extension with Anchored Resistance - 1 x daily - 7 x weekly - 1 sets - 10 reps Standing Isometric Shoulder Flexion with Doorway - Arm Bent - 1 x daily - 7 x weekly - 2 sets - 10 reps Isometric Shoulder Extension at Wall - 1 x daily - 7 x weekly - 1 sets - 5 reps - 5 hold Isometric Shoulder Abduction at Wall - 1 x daily - 7 x weekly - 2 sets - 10 reps Standing Isometric Shoulder Internal Rotation with Towel Roll at Doorway - 1 x daily - 7 x weekly - 1 sets - 5 reps - 5 hold Standing Isometric Shoulder External Rotation with Doorway - 1 x daily - 7 x weekly - 1 sets - 5 reps - 5 hold Doorway Pec Stretch at 90 Degrees Abduction - 1 x daily - 7 x weekly - 3 reps - 1 sets - 30-60 seconds hold Prone Shoulder Extension - Single Arm - 1 x daily - 7 x weekly - 1 sets - 10 reps Standing Shoulder Horizontal Abduction with Resistance - 1 x daily - 7 x weekly - 3 sets - 10 reps  Patient Education [place] Patient Instructions

## 2020-02-26 NOTE — Therapy (Signed)
[facility] [address], [place], [name], [address] Phone: [phone]   Fax:  [phone]  Physical Therapy Treatment  Patient Details  Name: William [name] MRN: [id number] Date of Birth: 1960/08/27 Referring Provider (PT): Dr. [name]   Encounter Date: 02/26/2020   PT End of Session - 02/26/20 1506    Visit Number 6    Date for PT Re-Evaluation 04/21/20    Authorization Type [employer]    PT Start Time 1017    PT Stop Time 1059    PT Time Calculation (min) 42 min    Activity Tolerance Patient tolerated treatment well;No increased pain           Past Medical History:  Diagnosis Date   Allergy    Anxiety    Depression    Gastroparesis    Heart murmur    Migraine headache    Mitral valve prolapse    SVT (supraventricular tachycardia) ([facility])     No past surgical history on file.  There were no vitals filed for this visit.   Subjective Assessment - 02/26/20 1500    Subjective I was doing those ex's wrong but Almyra Free showed me how to do the doorway and isometrics on the wall the right way.  I stopped doing the band ex's b/c I thought it might make my shoulder hurt.  I'm glad the doctor thinks PT and injections will help my shoulder and that I won't need surgery.  The injection doctor game me only helped 1 week.  But I might have overdone it and now the pain is back.    Pertinent History supraspinatus tendinosis; mild A/C arthritis;  history of LBP;  anxiety,depression, migraines;  previous pt of BF [name], [place])    Diagnostic tests MRI shoulder supraspinatus tendonosis and mild AC arthritis;    stenosis L3-4, L4-5 and L5-S1    Patient Stated Goals relieve pain, get stronger; return to strengthening    Currently in Pain? Yes    Pain Score 3     Pain Location Shoulder    Pain Orientation Right                             [facility] Adult PT Treatment/Exercise - 02/26/20  0001      Shoulder Exercises: Prone   Extension Strengthening;Right;15 reps      Shoulder Exercises: Standing   Extension Strengthening;Right;15 reps;Theraband    Theraband Level (Shoulder Extension) Level 3 (Green)    Row Strengthening;Right;15 reps;Theraband    Theraband Level (Shoulder Row) Level 3 (Green)    Theraband Level (Shoulder Diagonals) Level 3 (Green)    Diagonals Limitations diagonal extensions only 15x       Shoulder Exercises: ROM/Strengthening   [place] on 1st and 2nd step as warm up 20x       Shoulder Exercises: Isometric Strengthening   Flexion 5X10"    Extension 5X10"    External Rotation 5X10"    Internal Rotation 5X10"    ABduction 5X10"      Shoulder Exercises: Stretch   Other Shoulder Stretches doorway stretch at 90/90 3x30 sec                  PT Education - 02/26/20 1506    Education Details added internal and external rotation isometrics, prone extension    Person(s) Educated Patient    Methods Explanation;Demonstration;Handout    Comprehension Returned demonstration;Verbalized understanding  PT Short Term Goals - 02/17/20 1537      PT SHORT TERM GOAL #1   Title Pt will be independent with his initial HEP to promote healing    Status On-going             PT Long Term Goals - 01/28/20 1905      PT LONG TERM GOAL #1   Title Pt will be independent with his advanced HEP to improve ROM and  strength  with daily activity after d/c from PT.    Time 12    Period Weeks    Status New      PT LONG TERM GOAL #2   Title Pt will report  at least 60% improvement in Rt UE pain and use throughout the day.    Time 12    Period Weeks    Status New      PT LONG TERM GOAL #3   Title The patient will have grossly 5-/5 right shoulder strength needed for lifting    Time 12    Period Weeks    Status New      PT LONG TERM GOAL #4   Title FOTO functional outcome score improved from 46% limitation to 41%    Time 12    Period  Weeks    Status New      PT LONG TERM GOAL #5   Title Pt will be able to reach overhead into a cabinet x10 reps without increase in Rt shoulder pain.    Time 12    Period Weeks    Status On-going                 Plan - 02/26/20 1033    Clinical Impression Statement The patient expresses concern about returning to exercises and concern for aggravating his shoulder.  Discussed principles of activating and strengthening non-rotator cuff muscles to support the cuff and allow for healing.  Reviewed his current HEP with cues for best technique and to assess his response.  He reports very little pain during and post ex.    Comorbidities chronic history spinal pain, anxiety, depression, migraines    Examination-Activity Limitations Lift;Caring for Others    Examination-Participation Restrictions Interpersonal Relationship    Rehab Potential Good    PT Frequency 1x / week    PT Duration 12 weeks    PT Treatment/Interventions ADLs/Self Care Home Management;Electrical Stimulation;Cryotherapy;Aquatic Therapy;Moist Heat;Traction;Ultrasound;Neuromuscular re-education;Therapeutic exercise;Therapeutic activities;Patient/family education;Manual techniques;Dry needling;Taping;Spinal Manipulations;Iontophoresis 4mg /ml Dexamethasone    PT Next Visit Plan add band internal and external rotation; sidelying external rotation; add light weight to prone extensions;  hold on ionto since he had a recent injection    PT Home Exercise Plan [email or web address]    Recommended Other Services had injection 10/21           Patient will benefit from skilled therapeutic intervention in order to improve the following deficits and impairments:  Decreased range of motion, Pain, Impaired perceived functional ability, Decreased strength, Postural dysfunction, Improper body mechanics, Increased muscle spasms  Visit Diagnosis: Right shoulder pain, unspecified chronicity  Muscle weakness (generalized)     Problem  List Patient Active Problem List   Diagnosis Date Noted   Arthritis of right acromioclavicular joint 02/18/2020   Right rotator cuff tear 12/18/2019   Degenerative cervical disc 05/07/2019   Left lumbar radiculopathy 10/10/2017   Migraine without aura and without status migrainosus, not intractable 06/22/2017   Vitamin D deficiency 05/28/2017   Scrotal pain 05/28/2017  Paroxysmal SVT (supraventricular tachycardia) ([facility]) 05/28/2017   Mild left inguinal hernia 11/23/2016   Chronic pelvic pain in male 09/28/2016   Abdominal pain, left lower quadrant 09/28/2016   Isolated proteinuria without specific morphologic lesion 06/27/2016   Left testicular pain 06/27/2016   Acute left flank pain 06/27/2016   Epididymitis, left 06/05/2016   Pelvic floor dysfunction 04/01/2016   GERD (gastroesophageal reflux disease) 10/13/2015   Gastroparesis 10/13/2015   Intractable chronic migraine without aura and without status migrainosus 12/15/2013   Hyperlipidemia 04/04/2012   Mitral valve disease 07/15/2009   IRRITABLE BOWEL SYNDROME 07/15/2009   Palpitations 06/28/2009   Ruben Im, PT 02/26/20 3:18 PM Phone: [phone] Fax: [phone] Alvera Singh 02/26/2020, 3:16 PM  [facility] [address] [place], [name], [address] Phone: [phone]   Fax:  [phone]  Name: Jamarl Pew MRN: [id number] Date of Birth: February 07, 1961

## 2020-03-04 ENCOUNTER — Ambulatory Visit: Payer: PPO | Attending: Family Medicine | Admitting: Physical Therapy

## 2020-03-04 ENCOUNTER — Other Ambulatory Visit: Payer: Self-pay

## 2020-03-04 DIAGNOSIS — M62838 Other muscle spasm: Secondary | ICD-10-CM | POA: Insufficient documentation

## 2020-03-04 DIAGNOSIS — M25511 Pain in right shoulder: Secondary | ICD-10-CM | POA: Insufficient documentation

## 2020-03-04 DIAGNOSIS — M5442 Lumbago with sciatica, left side: Secondary | ICD-10-CM | POA: Diagnosis not present

## 2020-03-04 DIAGNOSIS — M6281 Muscle weakness (generalized): Secondary | ICD-10-CM | POA: Diagnosis not present

## 2020-03-04 DIAGNOSIS — G8929 Other chronic pain: Secondary | ICD-10-CM | POA: Diagnosis not present

## 2020-03-04 NOTE — Therapy (Signed)
[facility] [address] [place], [name], [address] Phone: [phone]   Fax:  [phone]  Physical Therapy Treatment  Patient Details  Name: William [name] MRN: [id number] Date of Birth: 17-Mar-1961 Referring Provider (PT): Dr. [name]   Encounter Date: 03/04/2020   PT End of Session - 03/04/20 1022    Visit Number 7    Date for PT Re-Evaluation 04/21/20    Authorization Type [employer]    PT Start Time 0930    PT Stop Time 1015    PT Time Calculation (min) 45 min    Activity Tolerance Patient tolerated treatment well;No increased pain           Past Medical History:  Diagnosis Date  . Allergy   . Anxiety   . Depression   . Gastroparesis   . Heart murmur   . Migraine headache   . Mitral valve prolapse   . SVT (supraventricular tachycardia) (HCC)     No past surgical history on file.  There were no vitals filed for this visit.   Subjective Assessment - 03/04/20 0931    Subjective Doing pretty good.  I still use my left arm more b/c I don't want to flare my right shoulder up. I do think the shot helped.    Pertinent History supraspinatus tendinosis; mild A/C arthritis;  history of LBP;  anxiety,depression, migraines;  previous pt of BF [name], [place])    Limitations Sitting;Standing;House hold activities    Currently in Pain? Yes    Pain Score 2     Pain Location Shoulder    Pain Orientation Right    Pain Descriptors / Indicators Sore    Aggravating Factors  lifting, carrying              [facility] PT Assessment - 03/04/20 0001      AROM   Right Shoulder Flexion 167 Degrees    Right Shoulder ABduction 172 Degrees    Right Shoulder Internal Rotation --   T10   Right Shoulder External Rotation 82 Degrees    Cervical - Right Side Bend 40    Cervical - Left Side Bend 36    Cervical - Right Rotation 70    Cervical - Left Rotation 65      Strength   Right Shoulder Flexion 4+/5     Right Shoulder ABduction 4/5   pain   Right Shoulder Internal Rotation 4+/5    Right Shoulder External Rotation 4+/5                         [facility] Adult PT Treatment/Exercise - 03/04/20 0001      Shoulder Exercises: Supine   Protraction Strengthening;Right;15 reps;Weights    Protraction Weight (lbs) 2    Flexion Limitations 12:00/6:00  2#    Other Supine Exercises 3:00/9:00 2#     Other Supine Exercises 2# circles 10x       Shoulder Exercises: Prone   Extension Strengthening;Right;15 reps    Extension Weight (lbs) 2    Extension Limitations kneeling     Horizontal ABduction 1 Strengthening;Right;20 reps    Horizontal ABduction 1 Weight (lbs) 0    Horizontal ABduction 1 Limitations kneeling    Horizontal ABduction 2 Right;20 reps    Horizontal ABduction 2 Weight (lbs) 0    Horizontal ABduction 2 Limitations kneeling       Shoulder Exercises: Sidelying   External Rotation Right;15 reps  External Rotation Weight (lbs) 2      Shoulder Exercises: Standing   External Rotation Strengthening;Right;20 reps;Theraband    Theraband Level (Shoulder External Rotation) Level 3 (Green)    Internal Rotation Strengthening;Right;20 reps;Theraband    Theraband Level (Shoulder Internal Rotation) Level 3 (Green)    Extension --    Theraband Level (Shoulder Extension) --    Row --    Theraband Level (Shoulder Row) --    Theraband Level (Shoulder Diagonals) --    Diagonals Limitations --      Shoulder Exercises: ROM/Strengthening   [place] on mat table 30x       Shoulder Exercises: Isometric Strengthening   Flexion --    Extension --    External Rotation 5X5"    Internal Rotation 5X10"    ABduction --                  PT Education - 03/04/20 1021    Education Details supine serratus punch; kneeling horizontal abduction, kneeling scaption, green band internal and external rotation small range of motion    Person(s) Educated Patient    Methods  Explanation;Demonstration;Handout    Comprehension Returned demonstration;Verbalized understanding            PT Short Term Goals - 03/04/20 1028      PT SHORT TERM GOAL #1   Title Pt will be independent with his initial HEP to promote healing    Status Achieved      PT SHORT TERM GOAL #2   Title The patient will report a 40% improvement in shoulder pain with reaching and lifting medium objects    Time 6    Period Weeks    Status On-going      PT SHORT TERM GOAL #3   Title Right shoulder internal rotation behind the back to T10 needed for dressing/ grooming    Status Achieved      PT SHORT TERM GOAL #4   Title ...      PT SHORT TERM GOAL #5   Title .[name]             PT Long Term Goals - 03/04/20 1029      PT LONG TERM GOAL #1   Title Pt will be independent with his advanced HEP to improve ROM and  strength  with daily activity after d/c from PT.    Time 12    Period Weeks    Status On-going    Target Date 04/21/20      PT LONG TERM GOAL #2   Title Pt will report  at least 60% improvement in Rt UE pain and use throughout the day.    Time 12    Period Weeks    Status On-going      PT LONG TERM GOAL #3   Title The patient will have grossly 5-/5 right shoulder strength needed for lifting    Time 12    Period Weeks    Status On-going      PT LONG TERM GOAL #4   Title FOTO functional outcome score improved from 46% limitation to 41%    Time 12    Period Weeks    Status On-going      PT LONG TERM GOAL #5   Title Pt will be able to reach overhead into a cabinet x10 reps without increase in Rt shoulder pain.    Time 12    Period Weeks    Status On-going  Plan - 03/04/20 1004    Clinical Impression Statement Improving shoulder ROM in all planes with nearly full ROM.  Cervical sidebending and rotation improved as well.  He is able to progress with glenohumeral and scapular strengthening although exercises modified with low or no resistance  or smaller ranges of movement.  He responds well to a slower progression due to past history of shoulder pain irritability.  Therapist closely monitoring and providing verbal and tactile cues to enhance muscle activation and best technique.    Comorbidities chronic history spinal pain, anxiety, depression, migraines    Examination-Participation Restrictions Interpersonal Relationship    Rehab Potential Good    PT Frequency 1x / week    PT Duration 12 weeks    PT Treatment/Interventions ADLs/Self Care Home Management;Electrical Stimulation;Cryotherapy;Aquatic Therapy;Moist Heat;Traction;Ultrasound;Neuromuscular re-education;Therapeutic exercise;Therapeutic activities;Patient/family education;Manual techniques;Dry needling;Taping;Spinal Manipulations;Iontophoresis 4mg /ml Dexamethasone    PT Next Visit Plan check % improvement for STGS; recheck FOTO in next few visits;  add light weight to kneeling horizontal abduction and scaption;  continue slow progession of right shoulder strenghtening;    hold on ionto since he had a recent injection    PT Home Exercise Plan [email or web address]    Consulted and Agree with Plan of Care Patient           Patient will benefit from skilled therapeutic intervention in order to improve the following deficits and impairments:  Decreased range of motion, Pain, Impaired perceived functional ability, Decreased strength, Postural dysfunction, Improper body mechanics, Increased muscle spasms  Visit Diagnosis: Right shoulder pain, unspecified chronicity  Muscle weakness (generalized)     Problem List Patient Active Problem List   Diagnosis Date Noted  . Arthritis of right acromioclavicular joint 02/18/2020  . Right rotator cuff tear 12/18/2019  . Degenerative cervical disc 05/07/2019  . Left lumbar radiculopathy 10/10/2017  . Migraine without aura and without status migrainosus, not intractable 06/22/2017  . Vitamin D deficiency 05/28/2017  . Scrotal pain 05/28/2017  .  Paroxysmal SVT (supraventricular tachycardia) ([place]) 05/28/2017  . Mild left inguinal hernia 11/23/2016  . Chronic pelvic pain in male 09/28/2016  . Abdominal pain, left lower quadrant 09/28/2016  . Isolated proteinuria without specific morphologic lesion 06/27/2016  . Left testicular pain 06/27/2016  . Acute left flank pain 06/27/2016  . Epididymitis, left 06/05/2016  . Pelvic floor dysfunction 04/01/2016  . GERD (gastroesophageal reflux disease) 10/13/2015  . Gastroparesis 10/13/2015  . Intractable chronic migraine without aura and without status migrainosus 12/15/2013  . Hyperlipidemia 04/04/2012  . Mitral valve disease 07/15/2009  . IRRITABLE BOWEL SYNDROME 07/15/2009  . Palpitations 06/28/2009   Ruben Im, PT 03/04/20 10:36 AM Phone: [phone] Fax: [phone] Alvera Singh 03/04/2020, 10:35 AM  [facility] [address], [place], [name], [address] Phone: [phone]   Fax:  [phone]  Name: William [name] MRN: [id number] Date of Birth: 13-Apr-1961

## 2020-03-04 NOTE — Patient Instructions (Signed)
Access Code: [email or web address] URL: [email or web address] Date: 03/04/2020 Prepared by: Ruben Im  Exercises Standing Bilateral Low Shoulder Row with Anchored Resistance - 2 x daily - 7 x weekly - 2 sets - 10 reps Single Arm Shoulder Extension with Anchored Resistance - 2 x daily - 7 x weekly - 2 sets - 10 reps Standing Diagonal Shoulder Extension with Anchored Resistance - 1 x daily - 7 x weekly - 1 sets - 10 reps Standing Isometric Shoulder Flexion with Doorway - Arm Bent - 1 x daily - 7 x weekly - 2 sets - 10 reps Isometric Shoulder Extension at Wall - 1 x daily - 7 x weekly - 1 sets - 5 reps - 5 hold Isometric Shoulder Abduction at Wall - 1 x daily - 7 x weekly - 2 sets - 10 reps Standing Isometric Shoulder Internal Rotation with Towel Roll at Doorway - 1 x daily - 7 x weekly - 1 sets - 5 reps - 5 hold Standing Isometric Shoulder External Rotation with Doorway - 1 x daily - 7 x weekly - 1 sets - 5 reps - 5 hold Doorway Pec Stretch at 90 Degrees Abduction - 1 x daily - 7 x weekly - 3 reps - 1 sets - 30-60 seconds hold Prone Shoulder Extension - Single Arm - 1 x daily - 7 x weekly - 1 sets - 10 reps Standing Shoulder Horizontal Abduction with Resistance - 1 x daily - 7 x weekly - 3 sets - 10 reps Standing Shoulder Internal Rotation with Anchored Resistance - 1 x daily - 7 x weekly - 2 sets - 10 reps Shoulder External Rotation with Anchored Resistance - 1 x daily - 7 x weekly - 2 sets - 10 reps Single Arm Bent Over Shoulder Horizontal Abduction with Dumbbell - Palm Down - 1 x daily - 7 x weekly - 2 sets - 10 reps Single Arm Bent Over Shoulder Scaption with Dumbbell - 1 x daily - 7 x weekly - 2 sets - 10 reps Supine Scapular Protraction in Flexion with Dumbbells - 1 x daily - 7 x weekly - 2 sets - 10 reps  Patient Education [place] Patient Instructions

## 2020-03-11 ENCOUNTER — Encounter: Payer: PPO | Admitting: Physical Therapy

## 2020-03-11 DIAGNOSIS — Z79899 Other long term (current) drug therapy: Secondary | ICD-10-CM | POA: Diagnosis not present

## 2020-03-11 DIAGNOSIS — R202 Paresthesia of skin: Secondary | ICD-10-CM | POA: Diagnosis not present

## 2020-03-11 DIAGNOSIS — E559 Vitamin D deficiency, unspecified: Secondary | ICD-10-CM | POA: Diagnosis not present

## 2020-03-11 DIAGNOSIS — M6289 Other specified disorders of muscle: Secondary | ICD-10-CM | POA: Diagnosis not present

## 2020-03-11 DIAGNOSIS — E538 Deficiency of other specified B group vitamins: Secondary | ICD-10-CM | POA: Diagnosis not present

## 2020-03-11 DIAGNOSIS — I471 Supraventricular tachycardia: Secondary | ICD-10-CM | POA: Diagnosis not present

## 2020-03-11 DIAGNOSIS — R5382 Chronic fatigue, unspecified: Secondary | ICD-10-CM | POA: Diagnosis not present

## 2020-03-11 DIAGNOSIS — R002 Palpitations: Secondary | ICD-10-CM | POA: Diagnosis not present

## 2020-03-11 DIAGNOSIS — R800 Isolated proteinuria: Secondary | ICD-10-CM | POA: Diagnosis not present

## 2020-03-14 ENCOUNTER — Telehealth: Payer: Self-pay | Admitting: *Deleted

## 2020-03-14 NOTE — Telephone Encounter (Signed)
Patient is returning call regarding monitor results. He is requesting to discuss further as he has additional questions. Please call.

## 2020-03-14 NOTE — Telephone Encounter (Signed)
Spoke with patient.  He wanted to report he had trouble with the increase of Cardizem to 180 mg from 120 mg.  Was getting lightheaded.  He was alternating them for a little while and then started to take 180 mg at night.  He feels he is tolerating better now.  Has BP cuff and will check some blood pressures on the 180 mg.  Did not have the cuff when he was lightheaded.

## 2020-03-14 NOTE — Telephone Encounter (Signed)
-----   Message from Burnell Blanks, MD sent at 03/14/2020  3:15 PM EST ----- He is still having short runs of atrial tach vs SVT. He is seeing Will Caminitz on the 18th in EP clinic. Continue Cardizem for now. Gerald Stabs

## 2020-03-14 NOTE — Telephone Encounter (Signed)
Left detailed message (DPR) of results and recommendations and to call back if any questions.

## 2020-03-16 ENCOUNTER — Ambulatory Visit: Payer: PPO | Admitting: Physical Therapy

## 2020-03-16 ENCOUNTER — Encounter: Payer: Self-pay | Admitting: Physical Therapy

## 2020-03-16 ENCOUNTER — Other Ambulatory Visit: Payer: Self-pay

## 2020-03-16 DIAGNOSIS — M6281 Muscle weakness (generalized): Secondary | ICD-10-CM

## 2020-03-16 DIAGNOSIS — M25511 Pain in right shoulder: Secondary | ICD-10-CM | POA: Diagnosis not present

## 2020-03-16 DIAGNOSIS — G8929 Other chronic pain: Secondary | ICD-10-CM

## 2020-03-16 DIAGNOSIS — M62838 Other muscle spasm: Secondary | ICD-10-CM

## 2020-03-16 NOTE — Therapy (Signed)
[facility] [address], [place], [name], [address] Phone: [phone]   Fax:  [phone]  Physical Therapy Treatment  Patient Details  Name: William [name] MRN: [id number] Date of Birth: [birth year] Referring Provider (PT): Dr. [name]   Encounter Date: 03/16/2020   PT End of Session - 03/16/20 0931    Visit Number 8    Date for PT Re-Evaluation 04/21/20    Authorization Type [employer]    PT Start Time 0930    PT Stop Time 1015    PT Time Calculation (min) 45 min    Activity Tolerance Patient tolerated treatment well;No increased pain    Behavior During Therapy WFL for tasks assessed/performed           Past Medical History:  Diagnosis Date  . Allergy   . Anxiety   . Depression   . Gastroparesis   . Heart murmur   . Migraine headache   . Mitral valve prolapse   . SVT (supraventricular tachycardia) ([place])     History reviewed. No pertinent surgical history.  There were no vitals filed for this visit.   Subjective Assessment - 03/16/20 0932    Subjective I hurt my shoulder. i tried to pick up something that might have been too heavy the other day and my pain has increased. I am also going to see the doctor about heart palpatations. I want to work on my pain only today.    Pertinent History supraspinatus tendinosis; mild A/C arthritis;  history of LBP;  anxiety,depression, migraines;  previous pt of BF [name], [place])    Limitations Sitting;Standing;House hold activities    Diagnostic tests MRI shoulder supraspinatus tendonosis and mild AC arthritis;    stenosis L3-4, L4-5 and L5-S1    Currently in Pain? Yes    Pain Score 6     Pain Location Shoulder    Pain Orientation Right    Pain Descriptors / Indicators Sore    Pain Radiating Towards into bicep    Aggravating Factors  lifting    Pain Relieving Factors TENS,    Multiple Pain Sites No                              [facility] Adult PT Treatment/Exercise - 03/16/20 0001      Neck Exercises: Machines for Strengthening   UBE (Upper Arm Bike) L1 x 31min forward      Shoulder Exercises: Seated   Other Seated Exercises Shoulder rolls backwrds 10x       Electrical Stimulation   Electrical Stimulation Location Rt shoulder and upper arm    Electrical Stimulation Action IFC    Electrical Stimulation Parameters 80-150 HZ    Electrical Stimulation Goals Pain      Iontophoresis   Type of Iontophoresis Dexamethasone    Location RT lateral shoulder point of tenderness    Dose 46ml    Time 4 hr wear, skin intact   #2                   PT Short Term Goals - 03/04/20 1028      PT SHORT TERM GOAL #1   Title Pt will be independent with his initial HEP to promote healing    Status Achieved      PT SHORT TERM GOAL #2   Title The patient will report a 40% improvement in shoulder pain with reaching and  lifting medium objects    Time 6    Period Weeks    Status On-going      PT SHORT TERM GOAL #3   Title Right shoulder internal rotation behind the back to T10 needed for dressing/ grooming    Status Achieved      PT SHORT TERM GOAL #4   Title ...      PT SHORT TERM GOAL #5   Title .[name]             PT Long Term Goals - 03/04/20 1029      PT LONG TERM GOAL #1   Title Pt will be independent with his advanced HEP to improve ROM and  strength  with daily activity after d/c from PT.    Time 12    Period Weeks    Status On-going    Target Date 04/21/20      PT LONG TERM GOAL #2   Title Pt will report  at least 60% improvement in Rt UE pain and use throughout the day.    Time 12    Period Weeks    Status On-going      PT LONG TERM GOAL #3   Title The patient will have grossly 5-/5 right shoulder strength needed for lifting    Time 12    Period Weeks    Status On-going      PT LONG TERM GOAL #4   Title FOTO functional outcome score improved from 46%  limitation to 41%    Time 12    Period Weeks    Status On-going      PT LONG TERM GOAL #5   Title Pt will be able to reach overhead into a cabinet x10 reps without increase in Rt shoulder pain.    Time 12    Period Weeks    Status On-going                 Plan - 03/16/20 0931    Clinical Impression Statement Pt reports 2 episodes since the weekend where he strained his shoulder attempting to lift something. He is also having heart palpatations that he he going to speak with MD about. Pt reports having some more anxiety due to increased shoulder pain and heart issue and requested to work on his pain ( wanted ESTIM) only and take a break from exercises. Pain at conclusion of session was reduced to 2-3/10 and an ionto patch was placed at RTC insertion.    Personal Factors and Comorbidities Comorbidity 1;Comorbidity 2;Comorbidity 3+;Past/Current Experience    Comorbidities chronic history spinal pain, anxiety, depression, migraines    Examination-Activity Limitations Lift;Caring for Others    Examination-Participation Restrictions Interpersonal Relationship    Stability/Clinical Decision Making Stable/Uncomplicated    Rehab Potential Good    PT Frequency 1x / week    PT Duration 12 weeks    PT Treatment/Interventions ADLs/Self Care Home Management;Electrical Stimulation;Cryotherapy;Aquatic Therapy;Moist Heat;Traction;Ultrasound;Neuromuscular re-education;Therapeutic exercise;Therapeutic activities;Patient/family education;Manual techniques;Dry needling;Taping;Spinal Manipulations;Iontophoresis 4mg /ml Dexamethasone    PT Next Visit Plan Restart gentle shoulder exercises, see what heart doctor said.    PT Home Exercise Plan [email or web address]    Consulted and Agree with Plan of Care Patient           Patient will benefit from skilled therapeutic intervention in order to improve the following deficits and impairments:  Decreased range of motion, Pain, Impaired perceived functional ability,  Decreased strength, Postural dysfunction, Improper body mechanics, Increased muscle spasms  Visit Diagnosis:  Right shoulder pain, unspecified chronicity  Muscle weakness (generalized)  Chronic left-sided low back pain with left-sided sciatica  Other muscle spasm     Problem List Patient Active Problem List   Diagnosis Date Noted  . Arthritis of right acromioclavicular joint 02/18/2020  . Right rotator cuff tear 12/18/2019  . Degenerative cervical disc 05/07/2019  . Left lumbar radiculopathy 10/10/2017  . Migraine without aura and without status migrainosus, not intractable 06/22/2017  . Vitamin D deficiency 05/28/2017  . Scrotal pain 05/28/2017  . Paroxysmal SVT (supraventricular tachycardia) ([place]) 05/28/2017  . Mild left inguinal hernia 11/23/2016  . Chronic pelvic pain in male 09/28/2016  . Abdominal pain, left lower quadrant 09/28/2016  . Isolated proteinuria without specific morphologic lesion 06/27/2016  . Left testicular pain 06/27/2016  . Acute left flank pain 06/27/2016  . Epididymitis, left 06/05/2016  . Pelvic floor dysfunction 04/01/2016  . GERD (gastroesophageal reflux disease) 10/13/2015  . Gastroparesis 10/13/2015  . Intractable chronic migraine without aura and without status migrainosus 12/15/2013  . Hyperlipidemia 04/04/2012  . Mitral valve disease 07/15/2009  . IRRITABLE BOWEL SYNDROME 07/15/2009  . Palpitations 06/28/2009    Akisha Sturgill, PTA 03/16/2020, 10:56 AM  [facility] [address] [place], [name], [address] Phone: [phone]   Fax:  [phone]  Name: William [name] MRN: [id number] Date of Birth: 03/16/1961

## 2020-03-17 ENCOUNTER — Ambulatory Visit: Payer: PPO | Admitting: Cardiology

## 2020-03-17 ENCOUNTER — Encounter: Payer: Self-pay | Admitting: Cardiology

## 2020-03-17 VITALS — BP 126/82 | HR 82 | Ht 70.0 in | Wt 144.2 lb

## 2020-03-17 DIAGNOSIS — Z23 Encounter for immunization: Secondary | ICD-10-CM | POA: Diagnosis not present

## 2020-03-17 DIAGNOSIS — I471 Supraventricular tachycardia: Secondary | ICD-10-CM

## 2020-03-17 NOTE — Progress Notes (Signed)
Electrophysiology Office Note   Date:  03/17/2020   ID:  William [name], DOB 1961-04-15, MRN [id number]  PCP:  Shawnee Knapp, MD  Cardiologist:  Angelena Form Primary Electrophysiologist:  Mamta Rimmer Meredith Leeds, MD    Chief Complaint: SVT   History of Present Illness: William [name] is a 59 y.o. male who is being seen today for the evaluation of SVT at the request of [name], NP. Presenting today for electrophysiology evaluation.  He has a history of palpitations, PVCs, SVT, migraines, mitral valve prolapse. He has not tolerated beta-blockers well in the past for his PVCs. He was remotely treated with diltiazem. He had an echo and stress test in 2011 which were both normal. He was admitted to the hospital in 2014 with chest pain. Cardiology work-up was negative and he had no evidence of PE on CTA. He had sinus tachycardia on telemetry. He represented in November 2018 with persistent palpitations. He had a monitor that showed sinus rhythm and a 40 beat run of SVT. He was started on propranolol, but this caused diarrhea. He was then started on diltiazem but he discontinued this.  He presented to cardiology clinic complaining of palpitations. He had been on both short and long-acting diltiazem. He was taking his short acting diltiazem multiple times a day. He wore a 2-week monitor that showed short episodes of SVT.  Today, he denies symptoms of chest pain, shortness of breath, orthopnea, PND, lower extremity edema, claudication, dizziness, presyncope, syncope, bleeding, or neurologic sequela. The patient is tolerating medications without difficulties.  He has continued to have episodic palpitations.  His palpitations last approximately 10 minutes at a time.  They did improve after his increased dose of diltiazem.  He felt weak and fatigued for a while on the diltiazem but this has since improved as well.  He wore a cardiac monitor and did have symptoms while wearing his monitor,  though his symptoms occurred during normal rhythm.   Past Medical History:  Diagnosis Date   Allergy    Anxiety    Depression    Gastroparesis    Heart murmur    Migraine headache    Mitral valve prolapse    SVT (supraventricular tachycardia) ([place])    History reviewed. No pertinent surgical history.   Current Outpatient Medications  Medication Sig Dispense Refill   baclofen (LIORESAL) 10 MG tablet Take 10 mg by mouth 3 (three) times daily.     diltiazem (CARDIZEM CD) 180 MG 24 hr capsule Take 1 capsule (180 mg total) by mouth daily. 90 capsule 3   diltiazem (CARDIZEM) 30 MG tablet TAKE (1) TABLET AS NEEDED FOR PALPITATIONS 30 tablet 5   EMGALITY 120 MG/ML SOAJ      fluticasone (FLONASE) 50 MCG/ACT nasal spray Place 2 sprays into both nostrils daily. 16 g 6   metoCLOPramide (REGLAN) 5 MG tablet Take 5 mg by mouth as needed.     ondansetron (ZOFRAN) 4 MG tablet Take 4 mg by mouth as needed.      Rimegepant Sulfate (NURTEC) 75 MG TBDP Take 75 mg by mouth as needed for up to 1 dose (Maximum 1 tablet in 24 hours.). 8 tablet 11   No current facility-administered medications for this visit.    Allergies:   Erythromycin, Nalbuphine, Cefdinir, Cephalexin, Cephalosporins, Hydrocodone-acetaminophen, Ketorolac, Prochlorperazine edisylate, Restasis [cyclosporine], Tyloxapol, Propranolol, Topamax [topiramate], Zetia [ezetimibe], Doxycycline hyclate, Iodinated diagnostic agents, Ketoconazole, Latex, Neosporin [neomycin-bacitracin zn-polymyx], Oxycodone-acetaminophen, and Prednisone   Social History:  The patient  reports that he has never smoked. He has never used smokeless tobacco. He reports that he does not drink alcohol and does not use drugs.   Family History:  The patient's family history includes Hypertension in his brother and mother; Kidney failure in his maternal grandmother.    ROS:  Please see the history of present illness.   Otherwise, review of systems is  positive for none.   All other systems are reviewed and negative.    PHYSICAL EXAM: VS:  BP 126/82    Pulse 82    Ht 5\' 10"  (1.778 m)    Wt 144 lb 3.2 oz (65.4 kg)    SpO2 98%    BMI 20.69 kg/m  , BMI Body mass index is 20.69 kg/m. GEN: Well nourished, well developed, in no acute distress  HEENT: normal  Neck: no JVD, carotid bruits, or masses Cardiac: RRR; no murmurs, rubs, or gallops,no edema  Respiratory:  clear to auscultation bilaterally, normal work of breathing GI: soft, nontender, nondistended, + BS MS: no deformity or atrophy  Skin: warm and dry Neuro:  Strength and sensation are intact Psych: euthymic mood, full affect  EKG:  EKG is ordered today. Personal review of the ekg ordered shows sinus rhythm, rate 82  Recent Labs: No results found for requested labs within last 8760 hours.    Lipid Panel     Component Value Date/Time   CHOL 233 (H) 12/29/2016 1036   TRIG 157 (H) 12/29/2016 1036   HDL 52 12/29/2016 1036   CHOLHDL 4.5 12/29/2016 1036   CHOLHDL 3.7 05/14/2015 0958   VLDL 25 05/14/2015 0958   LDLCALC 150 (H) 12/29/2016 1036     Wt Readings from Last 3 Encounters:  03/17/20 144 lb 3.2 oz (65.4 kg)  02/18/20 146 lb (66.2 kg)  02/04/20 146 lb (66.2 kg)      Other studies Reviewed: Additional studies/ records that were reviewed today include: Cardiac monitor 03/14/2020 personally reviewed Review of the above records today demonstrates:  Sinus rhythm Several short runs of supraventricular tachycardia vs atrial tachycardia    ASSESSMENT AND PLAN:  1. SVT: He has been having short episodes of SVT.  His SVT episodes last 20 seconds at the longest.  When he did have symptoms, he was in normal rhythm the vast majority of the time.  He currently takes diltiazem which has improved his overall symptoms.  At this point, I do not feel that he would benefit from ablation or further EP work-up.  He is comfortable with continuing his current dose of diltiazem and  having short acting diltiazem to take as needed.  Case discussed with primary cardiology  Current medicines are reviewed at length with the patient today.   The patient does not have concerns regarding his medicines.  The following changes were made today:  none  Labs/ tests ordered today include:  Orders Placed This Encounter  Procedures   EKG 12-Lead     Disposition:   FU with Jaelie Aguilera as needed  Signed, Wiletta Bermingham Meredith Leeds, MD  03/17/2020 10:17 AM     [address] [place] [address] [phone] (office) [phone] (fax)

## 2020-03-17 NOTE — Patient Instructions (Signed)
Medication Instructions:  Your physician recommends that you continue on your current medications as directed. Please refer to the Current Medication list given to you today.  *If you need a refill on your cardiac medications before your next appointment, please call your pharmacy*   Lab Work: None ordered   Testing/Procedures: None ordered   Follow-Up: At [facility], you and your health needs are our priority.  As part of our continuing mission to provide you with exceptional heart care, we have created designated Provider Care Teams.  These Care Teams include your primary Cardiologist (physician) and Advanced Practice Providers (APPs -  Physician Assistants and Nurse Practitioners) who all work together to provide you with the care you need, when you need it.  Your next appointment:   as  needed  The format for your next appointment:   In Person  Provider:   Will Camnitz, MD    Thank you for choosing [facility]!!   Azuri Bozard, RN [phone]        

## 2020-03-18 DIAGNOSIS — M5413 Radiculopathy, cervicothoracic region: Secondary | ICD-10-CM | POA: Diagnosis not present

## 2020-03-18 DIAGNOSIS — G43719 Chronic migraine without aura, intractable, without status migrainosus: Secondary | ICD-10-CM | POA: Diagnosis not present

## 2020-03-18 DIAGNOSIS — M7541 Impingement syndrome of right shoulder: Secondary | ICD-10-CM | POA: Diagnosis not present

## 2020-03-18 DIAGNOSIS — N5082 Scrotal pain: Secondary | ICD-10-CM | POA: Diagnosis not present

## 2020-03-18 DIAGNOSIS — R102 Pelvic and perineal pain: Secondary | ICD-10-CM | POA: Diagnosis not present

## 2020-03-18 DIAGNOSIS — G8929 Other chronic pain: Secondary | ICD-10-CM | POA: Diagnosis not present

## 2020-03-18 DIAGNOSIS — N50812 Left testicular pain: Secondary | ICD-10-CM | POA: Diagnosis not present

## 2020-03-18 DIAGNOSIS — R800 Isolated proteinuria: Secondary | ICD-10-CM | POA: Diagnosis not present

## 2020-03-18 DIAGNOSIS — K3184 Gastroparesis: Secondary | ICD-10-CM | POA: Diagnosis not present

## 2020-03-18 DIAGNOSIS — I471 Supraventricular tachycardia: Secondary | ICD-10-CM | POA: Diagnosis not present

## 2020-03-18 DIAGNOSIS — M6289 Other specified disorders of muscle: Secondary | ICD-10-CM | POA: Diagnosis not present

## 2020-03-18 DIAGNOSIS — R002 Palpitations: Secondary | ICD-10-CM | POA: Diagnosis not present

## 2020-03-21 ENCOUNTER — Other Ambulatory Visit: Payer: Self-pay

## 2020-03-21 ENCOUNTER — Encounter: Payer: Self-pay | Admitting: Physical Therapy

## 2020-03-21 ENCOUNTER — Ambulatory Visit: Payer: PPO | Admitting: Physical Therapy

## 2020-03-21 DIAGNOSIS — M25511 Pain in right shoulder: Secondary | ICD-10-CM | POA: Diagnosis not present

## 2020-03-21 DIAGNOSIS — M6281 Muscle weakness (generalized): Secondary | ICD-10-CM

## 2020-03-21 NOTE — Patient Instructions (Signed)
Access Code: [email or web address] URL: [email or web address] Date: 03/21/2020 Prepared by: Almyra Free  Exercises Standing Bilateral Low Shoulder Row with Anchored Resistance - 2 x daily - 7 x weekly - 2 sets - 10 reps Single Arm Shoulder Extension with Anchored Resistance - 2 x daily - 7 x weekly - 2 sets - 10 reps Standing Diagonal Shoulder Extension with Anchored Resistance - 1 x daily - 7 x weekly - 1 sets - 10 reps Standing Isometric Shoulder Flexion with Doorway - Arm Bent - 1 x daily - 7 x weekly - 2 sets - 10 reps Isometric Shoulder Extension at Wall - 1 x daily - 7 x weekly - 1 sets - 5 reps - 5 hold Isometric Shoulder Abduction at Wall - 1 x daily - 7 x weekly - 2 sets - 10 reps Standing Isometric Shoulder Internal Rotation with Towel Roll at Doorway - 1 x daily - 7 x weekly - 1 sets - 5 reps - 5 hold Standing Isometric Shoulder External Rotation with Doorway - 1 x daily - 7 x weekly - 1 sets - 5 reps - 5 hold Doorway Pec Stretch at 90 Degrees Abduction - 1 x daily - 7 x weekly - 3 reps - 1 sets - 30-60 seconds hold Prone Shoulder Extension - Single Arm - 1 x daily - 7 x weekly - 1 sets - 10 reps Standing Shoulder Horizontal Abduction with Resistance - 1 x daily - 7 x weekly - 3 sets - 10 reps Standing Shoulder Internal Rotation with Anchored Resistance - 1 x daily - 7 x weekly - 2 sets - 10 reps Shoulder External Rotation with Anchored Resistance - 1 x daily - 7 x weekly - 2 sets - 10 reps Single Arm Bent Over Shoulder Horizontal Abduction with Dumbbell - Palm Down - 1 x daily - 7 x weekly - 2 sets - 10 reps Single Arm Bent Over Shoulder Scaption with Dumbbell - 1 x daily - 7 x weekly - 2 sets - 10 reps Supine Scapular Protraction in Flexion with Dumbbells - 1 x daily - 7 x weekly - 2 sets - 10 reps Supine Shoulder Flexion Extension AAROM with Dowel - 1 x daily - 7 x weekly - 3 sets - 10 reps Supine Static Chest Stretch on Foam Roll - 1 x daily - 7 x weekly - 3 sets - 10 reps  Patient  Education [place] Patient Instructions

## 2020-03-21 NOTE — Therapy (Signed)
[facility] [address] [place], [name], [address] Phone: [phone]   Fax:  [phone]  Physical Therapy Treatment  Patient Details  Name: William [name] MRN: [id number] Date of Birth: December 12, 1960 Referring Provider (PT): Dr. [name]   Encounter Date: 03/21/2020   PT End of Session - 03/21/20 1233    Visit Number 9    Date for PT Re-Evaluation 04/21/20    Authorization Type [employer]    PT Start Time 1233    PT Stop Time 1318    PT Time Calculation (min) 45 min    Activity Tolerance Patient tolerated treatment well;No increased pain    Behavior During Therapy WFL for tasks assessed/performed           Past Medical History:  Diagnosis Date   Allergy    Anxiety    Depression    Gastroparesis    Heart murmur    Migraine headache    Mitral valve prolapse    SVT (supraventricular tachycardia) ([place])     History reviewed. No pertinent surgical history.  There were no vitals filed for this visit.   Subjective Assessment - 03/21/20 1233    Subjective the patch helped a lot.    Pertinent History supraspinatus tendinosis; mild A/C arthritis;  history of LBP;  anxiety,depression, migraines;  previous pt of BF [name], Jen)    Limitations Sitting;Standing;House hold activities    Diagnostic tests MRI shoulder supraspinatus tendonosis and mild AC arthritis;    stenosis L3-4, L4-5 and L5-S1    Patient Stated Goals relieve pain, get stronger; return to strengthening    Currently in Pain? Yes    Pain Score 4     Pain Location Shoulder    Pain Orientation Right    Pain Descriptors / Indicators Sore    Pain Type Chronic pain              [facility] PT Assessment - 03/21/20 0001      AROM   Right Shoulder Flexion 165 Degrees    Right Shoulder ABduction 172 Degrees    Right Shoulder Internal Rotation --   thumb to T10   Right Shoulder External Rotation 90 Degrees      Strength    Right Shoulder Flexion 5/5    Right Shoulder Extension 5/5    Right Shoulder ABduction 4+/5    Right Shoulder Internal Rotation 5/5    Right Shoulder External Rotation 4+/5                         [facility] Adult PT Treatment/Exercise - 03/21/20 0001      Shoulder Exercises: Supine   Protraction Strengthening;Right;15 reps;Weights    Protraction Weight (lbs) 5    Flexion 15 reps    Flexion Limitations 12:00/6:00  2#    Other Supine Exercises on roller: bil OH flexion with red band around wrists x 10 improves flex ROM to full    Other Supine Exercises 3:00/9:00 2# x 152# circles 10x  each way      Shoulder Exercises: Sidelying   External Rotation Right    External Rotation Weight (lbs) 1    External Rotation Limitations done in small range (neutral to about 20 deg 3 sets of 5-6 (fatigue)      Shoulder Exercises: Standing   External Rotation Right;20 reps    Theraband Level (Shoulder External Rotation) Level 3 (Green)    Internal Rotation Right;20 reps;Theraband  Theraband Level (Shoulder Internal Rotation) Level 3 (Green)      Shoulder Exercises: Stretch   Table Stretch - Abduction 3 reps;30 seconds    Table Stretch - ABduction Limitations on vertical foam roller                  PT Education - 03/21/20 1321    Education Details HEP progressed    Person(s) Educated Patient    Methods Demonstration;Explanation;Handout    Comprehension Verbalized understanding;Returned demonstration            PT Short Term Goals - 03/04/20 1028      PT SHORT TERM GOAL #1   Title Pt will be independent with his initial HEP to promote healing    Status Achieved      PT SHORT TERM GOAL #2   Title The patient will report a 40% improvement in shoulder pain with reaching and lifting medium objects    Time 6    Period Weeks    Status On-going      PT SHORT TERM GOAL #3   Title Right shoulder internal rotation behind the back to T10 needed for dressing/ grooming     Status Achieved      PT SHORT TERM GOAL #4   Title ...      PT SHORT TERM GOAL #5   Title .[name]             PT Long Term Goals - 03/21/20 1325      PT LONG TERM GOAL #1   Title Pt will be independent with his advanced HEP to improve ROM and  strength  with daily activity after d/c from PT.    Status Partially Met      PT LONG TERM GOAL #3   Title The patient will have grossly 5-/5 right shoulder strength needed for lifting    Status Partially Met                 Plan - 03/21/20 1321    Clinical Impression Statement Patient returns with decreased pain in right shoulder since last visit. He has improved ROM and strength today but still demos early fatigue with ER especially in intiial ROM. Improved flexion with cues to engage lats in supine. Overall patient improving and progressing toward goals. He reports MD wants him to continue with PT due to inconsistency due to other health issues.    Personal Factors and Comorbidities Comorbidity 1;Comorbidity 2;Comorbidity 3+;Past/Current Experience    Comorbidities chronic history spinal pain, anxiety, depression, migraines    PT Frequency 1x / week    PT Duration 12 weeks    PT Treatment/Interventions ADLs/Self Care Home Management;Electrical Stimulation;Cryotherapy;Aquatic Therapy;Moist Heat;Traction;Ultrasound;Neuromuscular re-education;Therapeutic exercise;Therapeutic activities;Patient/family education;Manual techniques;Dry needling;Taping;Spinal Manipulations;Iontophoresis 83m/ml Dexamethasone    PT Next Visit Plan FOTO; assess goals; continue strengthening to reach goals.    PT Home Exercise Plan [email or web address]   Consulted and Agree with Plan of Care Patient           Patient will benefit from skilled therapeutic intervention in order to improve the following deficits and impairments:  Decreased range of motion, Pain, Impaired perceived functional ability, Decreased strength, Postural dysfunction, Improper body mechanics,  Increased muscle spasms  Visit Diagnosis: Right shoulder pain, unspecified chronicity  Muscle weakness (generalized)     Problem List Patient Active Problem List   Diagnosis Date Noted   Arthritis of right acromioclavicular joint 02/18/2020   Right rotator cuff tear 12/18/2019   Degenerative cervical  disc 05/07/2019   Left lumbar radiculopathy 10/10/2017   Migraine without aura and without status migrainosus, not intractable 06/22/2017   Vitamin D deficiency 05/28/2017   Scrotal pain 05/28/2017   Paroxysmal SVT (supraventricular tachycardia) ([place]) 05/28/2017   Mild left inguinal hernia 11/23/2016   Chronic pelvic pain in male 09/28/2016   Abdominal pain, left lower quadrant 09/28/2016   Isolated proteinuria without specific morphologic lesion 06/27/2016   Left testicular pain 06/27/2016   Acute left flank pain 06/27/2016   Epididymitis, left 06/05/2016   Pelvic floor dysfunction 04/01/2016   GERD (gastroesophageal reflux disease) 10/13/2015   Gastroparesis 10/13/2015   Intractable chronic migraine without aura and without status migrainosus 12/15/2013   Hyperlipidemia 04/04/2012   Mitral valve disease 07/15/2009   IRRITABLE BOWEL SYNDROME 07/15/2009   Palpitations 06/28/2009    Madelyn Flavors PT 03/21/2020, 1:27 PM  [facility] [address], [place], [name], [address] Phone: [phone]   Fax:  [phone]  Name: William [name] MRN: [id number] Date of Birth: 1960-09-16

## 2020-03-22 ENCOUNTER — Ambulatory Visit: Payer: PPO | Admitting: Cardiology

## 2020-03-22 ENCOUNTER — Encounter: Payer: PPO | Admitting: Physical Therapy

## 2020-03-22 NOTE — Progress Notes (Deleted)
{Choose 1 Note Type (Video or Telephone):[phone]}    Date:  03/22/2020   ID:  William [name], DOB 1960/06/25, MRN [id number] The patient was identified using 2 identifiers.  {Patient [phone]::"Home"} {Provider Location:[phone]::"Home Office"}  PCP:  Shawnee Knapp, MD  Cardiologist:  Lauree Chandler, MD *** Electrophysiologist:  None   Evaluation Performed:  {Choose Visit JKDT:[id number]::"XIPJAS-NK Visit"}  Chief Complaint:  ***  History of Present Illness:    William [name] is a 59 y.o. male with a hx of palpitations/PVCs, SVT, migraine headaches, mitral valve prolapse and irritable bowel syndrome who follows with Dr. Angelena Form.   He has been known to have PVCs in the past however has not tolerated beta-blockers well in the past. He was remotely treated with diltiazem. He had an echocardiogram and stress test in 2011 which were normal. Holter monitor in 2011 with no evidence of SVT, atrial fibrillation. In 2014 he was admitted to the hospital for the evaluation of chest pain at which time he underwent cardiology work-up which was negative and no evidence of PE on chest CTA and remained in sinus tachycardia on telemetry. Repeat echocardiogram showed normal LV function and size and repeat 48-hour monitor with normal sinus rhythm and rare PACs.  He was then seen in follow-up 02/2017 with persistent complaints of palpitations repeat monitor showed NSR and one run of SVT around 40 beats.  He was started on propanolol for his SVT by his PCP however this caused diarrhea.  He was then started on diltiazem CD 05/2017 but he is self discontinued and preferred short acting diltiazem for episodes of tachycardia.   At most recent follow-up he had persistent complaints of palpitations several times per week.  He was started on diltiazem CD 120 mg daily with as needed diltiazem for breakthrough palpitations.  He was also taught vagal maneuvers with ablation as an option if  SVT is unable to be controlled with medications.  Mr. Jaime called the office 02/02/20 with c/o palpitations for the last several weeks without chest pain or shortness of breath symptoms.  He was then seen in follow up and reported he had been taking diltiazem 120 mg daily and has been taking up to 4x 30 mg tablets of short acting diltiazem to control with palpitations.  Last event monitor was 03/2017 therefore we discussed obtaining a 2-week ZIO monitor for full assessment and referral to EP.  He is also willing to uptitrate his long-acting diltiazem.   He was then referred to Dr. Curt Bears at which time he reported improvement in palpitations with the increased dose of diltiazem. He wore a cardiac monitor and did have symptoms while wearing his monitor, though his symptoms occurred during normal rhythm. Plan at that time was to continue diltiazem given improvement and defer ablation.  Today      1.  SVT: -Has been followed by Dr. Angelena Form for quite some time for palpitations/SVT.  He wore an event monitor 04/2017 which showed a 40 beat run of SVT at which time diltiazem CD was initiated however patient self discontinued. He was last seen by Dr. Angelena Form 05/07/2019 with more frequent palpitations with plans to restart long-acting diltiazem and short acting diltiazem as needed for breakthrough palpitations.  Plan was for possible EP referral for discussion regarding SVT ablation if necessary -Obtain Zio monitor  -Up-titrate Diltiazem to 180mg  PO QD along with PRN short acting dosing. Watch BP closely -Refer to EP for possible SVT ablation      The patient {does/does  not:200015} have symptoms concerning for COVID-19 infection (fever, chills, cough, or new shortness of breath).    Past Medical History:  Diagnosis Date  . Allergy   . Anxiety   . Depression   . Gastroparesis   . Heart murmur   . Migraine headache   . Mitral valve prolapse   . SVT (supraventricular tachycardia) ([facility])     No past surgical history on file.   No outpatient medications have been marked as taking for the 03/29/20 encounter (Appointment) with [name], NP.     Allergies:   Erythromycin, Nalbuphine, Cefdinir, Cephalexin, Cephalosporins, Hydrocodone-acetaminophen, Ketorolac, Prochlorperazine edisylate, Restasis [cyclosporine], Tyloxapol, Propranolol, Topamax [topiramate], Zetia [ezetimibe], Doxycycline hyclate, Iodinated diagnostic agents, Ketoconazole, Latex, Neosporin [neomycin-bacitracin zn-polymyx], Oxycodone-acetaminophen, and Prednisone   Social History   Tobacco Use  . Smoking status: Never Smoker  . Smokeless tobacco: Never Used  Vaping Use  . Vaping Use: Never used  Substance Use Topics  . Alcohol use: No    Alcohol/week: 0.0 standard drinks  . Drug use: No     Family Hx: The patient's family history includes Hypertension in his brother and mother; Kidney failure in his maternal grandmother. There is no history of Prostate cancer or Kidney cancer.  ROS:   Please see the history of present illness.    *** All other systems reviewed and are negative.   Prior CV studies:   The following studies were reviewed today:  Echocardiogram 07/10/2012:  Study Conclusions   Left ventricle: The cavity size was normal. Wall thickness  was normal. Systolic function was normal. The estimated  ejection fraction was in the range of 60% to 65%. Wall  motion was normal; there were no regional wall motion  abnormalities. Left ventricular diastolic function  parameters were normal.     Zio monitor 03/11/20:  Sinus rhythm Several short runs of supraventricular tachycardia vs atrial tachycardia   Labs/Other Tests and Data Reviewed:    EKG:  {EKG/Telemetry Strips Reviewed:([phone]}  Recent Labs: No results found for requested labs within last 8760 hours.   Recent Lipid Panel Lab Results  Component Value Date/Time   CHOL 233 (H) 12/29/2016 10:36 AM   TRIG 157 (H)  12/29/2016 10:36 AM   HDL 52 12/29/2016 10:36 AM   CHOLHDL 4.5 12/29/2016 10:36 AM   CHOLHDL 3.7 05/14/2015 09:58 AM   LDLCALC 150 (H) 12/29/2016 10:36 AM    Wt Readings from Last 3 Encounters:  03/17/20 144 lb 3.2 oz (65.4 kg)  02/18/20 146 lb (66.2 kg)  02/04/20 146 lb (66.2 kg)     Risk Assessment/Calculations:   {Does this patient have ATRIAL FIBRILLATION?:[id number]}  Objective:    Vital Signs:  There were no vitals taken for this visit.   {[facility] Virtual Exam (Optional):[phone]::"VITAL SIGNS:  reviewed"}  ASSESSMENT & PLAN:    1. ***   Shared Decision Making/Informed Consent   {Are you ordering a CV Procedure (e.g. stress test, cath, DCCV, TEE, etc)?   Press F2        :[id number]}    COVID-19 Education: The signs and symptoms of COVID-19 were discussed with the patient and how to seek care for testing (follow up with PCP or arrange E-visit).  ***The importance of social distancing was discussed today.  Time:   Today, I have spent *** minutes with the patient with telehealth technology discussing the above problems.     Medication Adjustments/Labs and Tests Ordered: Current medicines are reviewed at length with the patient today.  Concerns regarding  medicines are outlined above.   Tests Ordered: No orders of the defined types were placed in this encounter.   Medication Changes: No orders of the defined types were placed in this encounter.   Follow Up:  {F/U [phone]} {follow up:15908}  Signed, Kathyrn Drown, NP  03/22/2020 9:44 AM    [facility]

## 2020-03-29 ENCOUNTER — Telehealth: Payer: PPO | Admitting: Physician Assistant

## 2020-03-29 ENCOUNTER — Encounter: Payer: PPO | Admitting: Physical Therapy

## 2020-03-29 ENCOUNTER — Other Ambulatory Visit: Payer: Self-pay

## 2020-03-29 NOTE — Progress Notes (Deleted)
{Choose 1 Note Type (Video or Telephone):[phone]}    Date:  03/29/2020   ID:  William [name], DOB Jul 03, 1960, MRN [id number] The patient was identified using 2 identifiers.  {Patient Location:[phone]::"Home"} {Provider Location:[phone]::"Home Office"}  PCP:  Shawnee Knapp, MD  Cardiologist:  Lauree Chandler, MD *** Electrophysiologist:  None   Evaluation Performed:  {Choose Visit WUJW:[id number]::"AOZHYQ-MV Visit"}  Chief Complaint:  ***  Patient Profile: William [name] is a 59 y.o. male with:  Palpitations/PVCs  Intolerant of beta-blockers  SVT  Event monitor 1/19: one 40 beat episode  Event monitor 11/21: Several short runs of SVT (<20 seconds)  Migraine headaches  Mitral valve prolapse  Irritable bowel syndrome  B12 deficiency  Prior CV Studies: Zio patch monitor 11/21 Sinus rhythm; several short runs of SVT versus A. tach  Event monitor 03/2017 Sinus rhythm, one episode of SVT (40 beats)  Echocardiogram 07/10/2012 EF 60-65, normal wall motion   History of Present Illness:   William [name] had stopped long-acting diltiazem in the past and preferred a short acting formulation.  At follow-up in January 2021, he was placed back on long-acting diltiazem.  He was then seen 10/21 by Kathyrn Drown, NP for recurrent palpitations.  He was taking several doses of short acting diltiazem per day due to symptoms.  His diltiazem was increased from 120 to 180 mg daily.  He was referred to EP.  He saw Dr. Curt Bears 03/17/2020.  His longest episode of SVT was 20 seconds.  When he did have symptoms, he was in sinus rhythm the majority of the time.  Dr. Curt Bears did not recommend ablation or further EP work-up.  He recommended continuing current dose of diltiazem with as needed short acting diltiazem.  ***   Past Medical History:  Diagnosis Date   Allergy    Anxiety    Depression    Gastroparesis    Heart murmur    Migraine headache    Mitral  valve prolapse    SVT (supraventricular tachycardia) (HCC)    No past surgical history on file.   No outpatient medications have been marked as taking for the 03/29/20 encounter (Appointment) with Richardson Dopp T, PA-C.     Allergies:   Erythromycin, Nalbuphine, Cefdinir, Cephalexin, Cephalosporins, Hydrocodone-acetaminophen, Ketorolac, Prochlorperazine edisylate, Restasis [cyclosporine], Tyloxapol, Propranolol, Topamax [topiramate], Zetia [ezetimibe], Doxycycline hyclate, Iodinated diagnostic agents, Ketoconazole, Latex, Neosporin [neomycin-bacitracin zn-polymyx], Oxycodone-acetaminophen, and Prednisone   Social History   Tobacco Use   Smoking status: Never Smoker   Smokeless tobacco: Never Used  Vaping Use   Vaping Use: Never used  Substance Use Topics   Alcohol use: No    Alcohol/week: 0.0 standard drinks   Drug use: No     Family Hx: The patient's family history includes Hypertension in his brother and mother; Kidney failure in his maternal grandmother. There is no history of Prostate cancer or Kidney cancer.  ROS:   Please see the history of present illness.    *** All other systems reviewed and are negative.   Prior CV studies:   The following studies were reviewed today:  ***  Labs/Other Tests and Data Reviewed:    EKG:  {EKG/Telemetry Strips Reviewed:[phone]}  Recent Labs: No results found for requested labs within last 8760 hours.   Recent Lipid Panel Lab Results  Component Value Date/Time   CHOL 233 (H) 12/29/2016 10:36 AM   TRIG 157 (H) 12/29/2016 10:36 AM   HDL 52 12/29/2016 10:36 AM   CHOLHDL 4.5 12/29/2016 10:36 AM  CHOLHDL 3.7 05/14/2015 09:58 AM   LDLCALC 150 (H) 12/29/2016 10:36 AM    Wt Readings from Last 3 Encounters:  03/17/20 144 lb 3.2 oz (65.4 kg)  02/18/20 146 lb (66.2 kg)  02/04/20 146 lb (66.2 kg)     Risk Assessment/Calculations:   {Does this patient have ATRIAL FIBRILLATION?[phone]}  Objective:    Vital Signs:   There were no vitals taken for this visit.   {[facility] Virtual Exam (Optional):[phone]::"VITAL SIGNS:  reviewed"}  ASSESSMENT & PLAN:    1. ***   Shared Decision Making/Informed Consent   {Are you ordering a CV Procedure (e.g. stress test, cath, DCCV, TEE, etc)?   Press F2        :[id number]}    COVID-19 Education: The signs and symptoms of COVID-19 were discussed with the patient and how to seek care for testing (follow up with PCP or arrange E-visit).  ***The importance of social distancing was discussed today.  Time:   Today, I have spent *** minutes with the patient with telehealth technology discussing the above problems.     Medication Adjustments/Labs and Tests Ordered: Current medicines are reviewed at length with the patient today.  Concerns regarding medicines are outlined above.   Tests Ordered: No orders of the defined types were placed in this encounter.   Medication Changes: No orders of the defined types were placed in this encounter.   Follow Up:  {F/U [phone]} {follow up:15908}  Signed, Richardson Dopp, PA-C  03/29/2020 7:55 AM    [facility]

## 2020-03-30 ENCOUNTER — Ambulatory Visit: Payer: PPO | Admitting: Family Medicine

## 2020-04-01 ENCOUNTER — Ambulatory Visit: Payer: PPO | Attending: Family Medicine | Admitting: Physical Therapy

## 2020-04-01 ENCOUNTER — Encounter: Payer: Self-pay | Admitting: Physical Therapy

## 2020-04-01 ENCOUNTER — Other Ambulatory Visit: Payer: Self-pay

## 2020-04-01 DIAGNOSIS — M62838 Other muscle spasm: Secondary | ICD-10-CM | POA: Diagnosis not present

## 2020-04-01 DIAGNOSIS — M5442 Lumbago with sciatica, left side: Secondary | ICD-10-CM | POA: Diagnosis not present

## 2020-04-01 DIAGNOSIS — G8929 Other chronic pain: Secondary | ICD-10-CM | POA: Insufficient documentation

## 2020-04-01 DIAGNOSIS — M6281 Muscle weakness (generalized): Secondary | ICD-10-CM | POA: Diagnosis not present

## 2020-04-01 DIAGNOSIS — M25511 Pain in right shoulder: Secondary | ICD-10-CM | POA: Insufficient documentation

## 2020-04-01 NOTE — Therapy (Signed)
[facility] [address], [place], [name], [address] Phone: [phone]   Fax:  [phone]  Physical Therapy Treatment  Patient Details  Name: William [name] MRN: [id number] Date of Birth: [birth year] Referring Provider (PT): Dr. [name]   Encounter Date: 04/01/2020   PT End of Session - 04/01/20 0759    Visit Number 10    Date for PT Re-Evaluation 04/21/20    Authorization Type [employer]    PT Start Time 0759    PT Stop Time 0842    PT Time Calculation (min) 43 min    Activity Tolerance Patient tolerated treatment well;No increased pain    Behavior During Therapy WFL for tasks assessed/performed           Past Medical History:  Diagnosis Date  . Allergy   . Anxiety   . Depression   . Gastroparesis   . Heart murmur   . Migraine headache   . Mitral valve prolapse   . SVT (supraventricular tachycardia) ([place])     History reviewed. No pertinent surgical history.  There were no vitals filed for this visit.   Subjective Assessment - 04/01/20 0804    Subjective I have enough exercises, pain is much less. I can do most everything I want or need to do.    Pertinent History supraspinatus tendinosis; mild A/C arthritis;  history of LBP;  anxiety,depression, migraines;  previous pt of BF [name], [place])    Currently in Pain? Yes    Pain Score 1     Pain Location Shoulder    Pain Orientation Right    Pain Descriptors / Indicators Dull;Aching    Multiple Pain Sites No              [facility] PT Assessment - 04/01/20 0001      Observation/Other Assessments   Focus on Therapeutic Outcomes (FOTO)  42% limited                         [facility] Adult PT Treatment/Exercise - 04/01/20 0001      Neck Exercises: Machines for Strengthening   UBE (Upper Arm Bike) L1 3x3 with PTA present to discuss current status       Shoulder Exercises: Supine   Protraction Strengthening;Right;Weights    2x10   Protraction Weight (lbs) 5    Horizontal ABduction Strengthening;Both;20 reps;Theraband   Vc to keep scap on bed   Theraband Level (Shoulder Horizontal ABduction) Level 2 (Red)    Other Supine Exercises 5# bicep curls 10#     Other Supine Exercises flexion to 90 degrees 2# 10x, scaption 1# 10x,abd 1# 10x      Shoulder Exercises: Sidelying   External Rotation Right;20 reps;Weights   2x10   External Rotation Weight (lbs) 1    External Rotation Limitations TC to keep scap stabs      Shoulder Exercises: Standing   External Rotation Strengthening;Right;Theraband   2x15   Internal Rotation Strengthening;Right;Theraband   2x15   Theraband Level (Shoulder Internal Rotation) Level 3 (Green)      Shoulder Exercises: Stretch   Other Shoulder Stretches Overhead door stretch for flexion/abduction 10x                     PT Short Term Goals - 03/04/20 1028      PT SHORT TERM GOAL #1   Title Pt will be independent with his initial HEP to promote healing  Status Achieved      PT SHORT TERM GOAL #2   Title The patient will report a 40% improvement in shoulder pain with reaching and lifting medium objects    Time 6    Period Weeks    Status On-going      PT SHORT TERM GOAL #3   Title Right shoulder internal rotation behind the back to T10 needed for dressing/ grooming    Status Achieved      PT SHORT TERM GOAL #4   Title ...      PT SHORT TERM GOAL #5   Title .[name]             PT Long Term Goals - 03/21/20 1325      PT LONG TERM GOAL #1   Title Pt will be independent with his advanced HEP to improve ROM and  strength  with daily activity after d/c from PT.    Status Partially Met      PT LONG TERM GOAL #3   Title The patient will have grossly 5-/5 right shoulder strength needed for lifting    Status Partially Met                 Plan - 04/01/20 0806    Clinical Impression Statement Pt arrives with very little pain and verbal reports of being very  pleased with his progress and pain reduction. He describes occasional flare ups of pain but typically nothing he "can't handle." He also reports he has "enough home exercises at this time." Added seated PREs for deltoid strength today and continued with scap strength and stabilization. FOTO score improving and moving towards LTG.    Personal Factors and Comorbidities Comorbidity 1;Comorbidity 2;Comorbidity 3+;Past/Current Experience    Comorbidities chronic history spinal pain, anxiety, depression, migraines    Examination-Activity Limitations Lift;Caring for Others    Examination-Participation Restrictions Interpersonal Relationship    Stability/Clinical Decision Making Stable/Uncomplicated    PT Frequency 1x / week    PT Duration 12 weeks    PT Treatment/Interventions ADLs/Self Care Home Management;Electrical Stimulation;Cryotherapy;Aquatic Therapy;Moist Heat;Traction;Ultrasound;Neuromuscular re-education;Therapeutic exercise;Therapeutic activities;Patient/family education;Manual techniques;Dry needling;Taping;Spinal Manipulations;Iontophoresis 9m/ml Dexamethasone    PT Home Exercise Plan [id number]    Consulted and Agree with Plan of Care Patient           Patient will benefit from skilled therapeutic intervention in order to improve the following deficits and impairments:  Decreased range of motion, Pain, Impaired perceived functional ability, Decreased strength, Postural dysfunction, Improper body mechanics, Increased muscle spasms  Visit Diagnosis: Right shoulder pain, unspecified chronicity  Muscle weakness (generalized)     Problem List Patient Active Problem List   Diagnosis Date Noted  . Arthritis of right acromioclavicular joint 02/18/2020  . Right rotator cuff tear 12/18/2019  . Degenerative cervical disc 05/07/2019  . Left lumbar radiculopathy 10/10/2017  . Migraine without aura and without status migrainosus, not intractable 06/22/2017  . Vitamin D deficiency 05/28/2017   . Scrotal pain 05/28/2017  . Paroxysmal SVT (supraventricular tachycardia) ([place] 05/28/2017  . Mild left inguinal hernia 11/23/2016  . Chronic pelvic pain in male 09/28/2016  . Abdominal pain, left lower quadrant 09/28/2016  . Isolated proteinuria without specific morphologic lesion 06/27/2016  . Left testicular pain 06/27/2016  . Acute left flank pain 06/27/2016  . Epididymitis, left 06/05/2016  . Pelvic floor dysfunction 04/01/2016  . GERD (gastroesophageal reflux disease) 10/13/2015  . Gastroparesis 10/13/2015  . Intractable chronic migraine without aura and without status migrainosus 12/15/2013  .  Hyperlipidemia 04/04/2012  . Mitral valve disease 07/15/2009  . IRRITABLE BOWEL SYNDROME 07/15/2009  . Palpitations 06/28/2009    Derrian Poli, PTA 04/01/2020, 8:44 AM  [facility] [address] [place], [name], [address] Phone: [phone]   Fax:  [phone]  Name: Beuford Garcilazo MRN: [id number] Date of Birth: [birth year]

## 2020-04-04 ENCOUNTER — Other Ambulatory Visit (HOSPITAL_BASED_OUTPATIENT_CLINIC_OR_DEPARTMENT_OTHER): Payer: Self-pay | Admitting: Internal Medicine

## 2020-04-04 ENCOUNTER — Ambulatory Visit: Payer: PPO | Attending: Internal Medicine

## 2020-04-04 DIAGNOSIS — Z23 Encounter for immunization: Secondary | ICD-10-CM

## 2020-04-04 NOTE — Progress Notes (Signed)
   Covid-19 Vaccination Clinic  Name:  William [name]    MRN: [id number] DOB: [birth year]  04/04/2020  [name] was observed post Covid-19 immunization for 15 minutes without incident. He was provided with Vaccine Information Sheet and instruction to access the V-Safe system.   [name] was instructed to call 911 with any severe reactions post vaccine: [name] Difficulty breathing  . Swelling of face and throat  . A fast heartbeat  . A bad rash all over body  . Dizziness and weakness   Immunizations Administered    Name Date Dose VIS Date Route   Pfizer COVID-19 Vaccine 04/04/2020 10:19 AM 0.3 mL 02/17/2020 Intramuscular   Manufacturer: [place]   Lot: [id number]   [place]: [address]-4

## 2020-04-06 ENCOUNTER — Ambulatory Visit: Payer: PPO | Admitting: Physical Therapy

## 2020-04-07 ENCOUNTER — Ambulatory Visit: Payer: PPO | Admitting: Cardiology

## 2020-04-08 ENCOUNTER — Encounter: Payer: Self-pay | Admitting: Physical Therapy

## 2020-04-08 ENCOUNTER — Ambulatory Visit: Payer: PPO | Admitting: Physical Therapy

## 2020-04-08 ENCOUNTER — Other Ambulatory Visit: Payer: Self-pay

## 2020-04-08 DIAGNOSIS — G8929 Other chronic pain: Secondary | ICD-10-CM

## 2020-04-08 DIAGNOSIS — M25511 Pain in right shoulder: Secondary | ICD-10-CM | POA: Diagnosis not present

## 2020-04-08 DIAGNOSIS — M62838 Other muscle spasm: Secondary | ICD-10-CM

## 2020-04-08 DIAGNOSIS — M6281 Muscle weakness (generalized): Secondary | ICD-10-CM

## 2020-04-08 NOTE — Therapy (Signed)
[facility] [address], [place], [name], [address] Phone: [phone]   Fax:  [phone]  Physical Therapy Treatment  Patient Details  Name: William [name] MRN: [id number] Date of Birth: 14-May-1960 Referring Provider (PT): Dr. [name]   Encounter Date: 04/08/2020   PT End of Session - 04/08/20 0805    Visit Number 11    Date for PT Re-Evaluation 04/21/20    Authorization Type [employer]    PT Start Time 0800    PT Stop Time 9798    PT Time Calculation (min) 38 min    Activity Tolerance No increased pain;Patient limited by lethargy    Behavior During Therapy [facility] for tasks assessed/performed           Past Medical History:  Diagnosis Date  . Allergy   . Anxiety   . Depression   . Gastroparesis   . Heart murmur   . Migraine headache   . Mitral valve prolapse   . SVT (supraventricular tachycardia) ([place])     History reviewed. No pertinent surgical history.  There were no vitals filed for this visit.       [facility] PT Assessment - 04/08/20 0001      AROM   Right Shoulder Flexion 165 Degrees    Right Shoulder ABduction 165 Degrees    Right Shoulder External Rotation 90 Degrees                         [facility] Adult PT Treatment/Exercise - 04/08/20 0001      Neck Exercises: Machines for Strengthening   UBE (Upper Arm Bike) L1 2x2 with PTA present to discuss current status      Shoulder Exercises: Supine   Flexion Strengthening;Right;10 reps;Weights    Shoulder Flexion Weight (lbs) 1    Other Supine Exercises 5# bicep curls 10x2    Other Supine Exercises flexion to 90 degrees 2# 10x, scaption 1# 10x,abd 1# 10x      Shoulder Exercises: Sidelying   External Rotation Right;20 reps;Weights   2x10   External Rotation Weight (lbs) 1      Shoulder Exercises: Standing   Row Strengthening;Both;15 reps;Theraband   green   Other Standing Exercises Green ER/IR 15x each                     PT Short Term Goals - 03/04/20 1028      PT SHORT TERM GOAL #1   Title Pt will be independent with his initial HEP to promote healing    Status Achieved      PT SHORT TERM GOAL #2   Title The patient will report a 40% improvement in shoulder pain with reaching and lifting medium objects    Time 6    Period Weeks    Status On-going      PT SHORT TERM GOAL #3   Title Right shoulder internal rotation behind the back to T10 needed for dressing/ grooming    Status Achieved      PT SHORT TERM GOAL #4   Title ...      PT SHORT TERM GOAL #5   Title .[name]             PT Long Term Goals - 03/21/20 1325      PT LONG TERM GOAL #1   Title Pt will be independent with his advanced HEP to improve ROM and  strength  with  daily activity after d/c from PT.    Status Partially Met      PT LONG TERM GOAL #3   Title The patient will have grossly 5-/5 right shoulder strength needed for lifting    Status Partially Met                 Plan - 04/08/20 0805    Clinical Impression Statement Pt received a COVID booster early on in the week and had a difficult time post having to cancel his last appt. Today he still felt significant fatigue and is only operating on 3 hrs of sleep. pt was able to sludge his way through his exercises but was visably very tired. AROM in all planes is WNL.    Personal Factors and Comorbidities Comorbidity 1;Comorbidity 2;Comorbidity 3+;Past/Current Experience    Comorbidities chronic history spinal pain, anxiety, depression, migraines    Examination-Activity Limitations Lift;Caring for Others    Examination-Participation Restrictions Interpersonal Relationship    Stability/Clinical Decision Making Stable/Uncomplicated    Rehab Potential Good    PT Frequency 1x / week    PT Duration 12 weeks    PT Treatment/Interventions ADLs/Self Care Home Management;Electrical Stimulation;Cryotherapy;Aquatic Therapy;Moist  Heat;Traction;Ultrasound;Neuromuscular re-education;Therapeutic exercise;Therapeutic activities;Patient/family education;Manual techniques;Dry needling;Taping;Spinal Manipulations;Iontophoresis 4mg/ml Dexamethasone    PT Next Visit Plan Strength, functional activity tolerance    PT Home Exercise Plan [email or web address]    Consulted and Agree with Plan of Care Patient           Patient will benefit from skilled therapeutic intervention in order to improve the following deficits and impairments:  Decreased range of motion,Pain,Impaired perceived functional ability,Decreased strength,Postural dysfunction,Improper body mechanics,Increased muscle spasms  Visit Diagnosis: Right shoulder pain, unspecified chronicity  Muscle weakness (generalized)  Chronic left-sided low back pain with left-sided sciatica  Other muscle spasm     Problem List Patient Active Problem List   Diagnosis Date Noted  . Arthritis of right acromioclavicular joint 02/18/2020  . Right rotator cuff tear 12/18/2019  . Degenerative cervical disc 05/07/2019  . Left lumbar radiculopathy 10/10/2017  . Migraine without aura and without status migrainosus, not intractable 06/22/2017  . Vitamin D deficiency 05/28/2017  . Scrotal pain 05/28/2017  . Paroxysmal SVT (supraventricular tachycardia) (HCC) 05/28/2017  . Mild left inguinal hernia 11/23/2016  . Chronic pelvic pain in male 09/28/2016  . Abdominal pain, left lower quadrant 09/28/2016  . Isolated proteinuria without specific morphologic lesion 06/27/2016  . Left testicular pain 06/27/2016  . Acute left flank pain 06/27/2016  . Epididymitis, left 06/05/2016  . Pelvic floor dysfunction 04/01/2016  . GERD (gastroesophageal reflux disease) 10/13/2015  . Gastroparesis 10/13/2015  . Intractable chronic migraine without aura and without status migrainosus 12/15/2013  . Hyperlipidemia 04/04/2012  . Mitral valve disease 07/15/2009  . IRRITABLE BOWEL SYNDROME 07/15/2009  .  Palpitations 06/28/2009    COCHRAN,JENNIFER, PTA 04/08/2020, 8:39 AM  [facility] [address], [address] [place], [place], [address] Phone: [phone]   Fax:  [phone]  Name: William [name] MRN: [id number] Date of Birth: 01/19/1961   

## 2020-04-11 MED FILL — PFIZER-BIONTECH COVID-19 VA: 30 | 1 days supply | Qty: 0 | Fill #0

## 2020-04-13 ENCOUNTER — Ambulatory Visit: Payer: PPO | Admitting: Physical Therapy

## 2020-04-13 ENCOUNTER — Other Ambulatory Visit: Payer: Self-pay

## 2020-04-13 ENCOUNTER — Encounter: Payer: Self-pay | Admitting: Physical Therapy

## 2020-04-13 DIAGNOSIS — M6281 Muscle weakness (generalized): Secondary | ICD-10-CM

## 2020-04-13 DIAGNOSIS — M25511 Pain in right shoulder: Secondary | ICD-10-CM | POA: Diagnosis not present

## 2020-04-13 NOTE — Therapy (Signed)
[facility] [address] [place], [name], [address] Phone: [phone]   Fax:  [phone]  Physical Therapy Treatment  Patient Details  Name: William [name] MRN: [id number] Date of Birth: February 01, 1961 Referring Provider (PT): Dr. [name]   Encounter Date: 04/13/2020   PT End of Session - 04/13/20 0801    Visit Number 12    Date for PT Re-Evaluation 04/21/20    Authorization Type [employer]    PT Start Time 0800    PT Stop Time 0840    PT Time Calculation (min) 40 min    Activity Tolerance No increased pain;Patient limited by lethargy    Behavior During Therapy [facility] for tasks assessed/performed           Past Medical History:  Diagnosis Date  . Allergy   . Anxiety   . Depression   . Gastroparesis   . Heart murmur   . Migraine headache   . Mitral valve prolapse   . SVT (supraventricular tachycardia) ([place])     History reviewed. No pertinent surgical history.  There were no vitals filed for this visit.   Subjective Assessment - 04/13/20 0804    Subjective I am still feeling low energy like Monday. I am seeing my MD this Friday.    Pertinent History supraspinatus tendinosis; mild A/C arthritis;  history of LBP;  anxiety,depression, migraines;  previous pt of BF [name], [place])    Currently in Pain? No/denies   My upper arm feels tight in the bicep area.                            [facility] Adult PT Treatment/Exercise - 04/13/20 0001      Neck Exercises: Machines for Strengthening   UBE (Upper Arm Bike) L1 3x3 with concurrent discussion of status      Shoulder Exercises: Supine   Flexion Strengthening;Right;20 reps;Weights   2x10   Shoulder Flexion Weight (lbs) 3    Other Supine Exercises 5# bicep curls 10x2   seated   Other Supine Exercises flexion to 90 degrees 2# 10x, scaption 1# 10x,abd 1# 10x      Shoulder Exercises: Sidelying   External Rotation Right;20  reps;Weights   2x10   External Rotation Weight (lbs) 2      Shoulder Exercises: Standing   Row Strengthening;Both;Theraband;20 reps   green   Other Standing Exercises Green ER/IR 15x2 each      Moist Heat Therapy   Number Minutes Moist Heat --   15 with Estim     Electrical Stimulation   Electrical Stimulation Action RT shoulder/upper arm    Electrical Stimulation Parameters 80-150 HZ    Electrical Stimulation Goals Pain   post TE                   PT Short Term Goals - 03/04/20 1028      PT SHORT TERM GOAL #1   Title Pt will be independent with his initial HEP to promote healing    Status Achieved      PT SHORT TERM GOAL #2   Title The patient will report a 40% improvement in shoulder pain with reaching and lifting medium objects    Time 6    Period Weeks    Status On-going      PT SHORT TERM GOAL #3   Title Right shoulder internal rotation behind the back to T10 needed for  dressing/ grooming    Status Achieved      PT SHORT TERM GOAL #4   Title ...      PT SHORT TERM GOAL #5   Title .[name]             PT Long Term Goals - 03/21/20 1325      PT LONG TERM GOAL #1   Title Pt will be independent with his advanced HEP to improve ROM and  strength  with daily activity after d/c from PT.    Status Partially Met      PT LONG TERM GOAL #3   Title The patient will have grossly 5-/5 right shoulder strength needed for lifting    Status Partially Met                 Plan - 04/13/20 0806    Clinical Impression Statement Pt reports still feeling negative effects potentially from booster shot, he will follow up with his MD this Friday. Because of this pt requested to keep the intensity and volume about the same as Monday which he tolerated ok. In fact pt did tolerate slight increases in resistance for shoulder strength today, accompanied by increasing levels of fatigue/lethargy. Pt requested a round of Estim to upper arm/bicep area post exercises.    Personal  Factors and Comorbidities Comorbidity 1;Comorbidity 2;Comorbidity 3+;Past/Current Experience    Comorbidities chronic history spinal pain, anxiety, depression, migraines    Examination-Activity Limitations Lift;Caring for Others    Examination-Participation Restrictions Interpersonal Relationship    Stability/Clinical Decision Making Stable/Uncomplicated    Rehab Potential Good    PT Frequency 1x / week    PT Duration 12 weeks    PT Treatment/Interventions ADLs/Self Care Home Management;Electrical Stimulation;Cryotherapy;Aquatic Therapy;Moist Heat;Traction;Ultrasound;Neuromuscular re-education;Therapeutic exercise;Therapeutic activities;Patient/family education;Manual techniques;Dry needling;Taping;Spinal Manipulations;Iontophoresis 44m/ml Dexamethasone    PT Next Visit Plan Reassessment next visit.    PT Home Exercise Plan [id number]   Consulted and Agree with Plan of Care Patient           Patient will benefit from skilled therapeutic intervention in order to improve the following deficits and impairments:  Decreased range of motion,Pain,Impaired perceived functional ability,Decreased strength,Postural dysfunction,Improper body mechanics,Increased muscle spasms  Visit Diagnosis: Right shoulder pain, unspecified chronicity  Muscle weakness (generalized)     Problem List Patient Active Problem List   Diagnosis Date Noted  . Arthritis of right acromioclavicular joint 02/18/2020  . Right rotator cuff tear 12/18/2019  . Degenerative cervical disc 05/07/2019  . Left lumbar radiculopathy 10/10/2017  . Migraine without aura and without status migrainosus, not intractable 06/22/2017  . Vitamin D deficiency 05/28/2017  . Scrotal pain 05/28/2017  . Paroxysmal SVT (supraventricular tachycardia) ([place] 05/28/2017  . Mild left inguinal hernia 11/23/2016  . Chronic pelvic pain in male 09/28/2016  . Abdominal pain, left lower quadrant 09/28/2016  . Isolated proteinuria without specific  morphologic lesion 06/27/2016  . Left testicular pain 06/27/2016  . Acute left flank pain 06/27/2016  . Epididymitis, left 06/05/2016  . Pelvic floor dysfunction 04/01/2016  . GERD (gastroesophageal reflux disease) 10/13/2015  . Gastroparesis 10/13/2015  . Intractable chronic migraine without aura and without status migrainosus 12/15/2013  . Hyperlipidemia 04/04/2012  . Mitral valve disease 07/15/2009  . IRRITABLE BOWEL SYNDROME 07/15/2009  . Palpitations 06/28/2009    William [name], PTA 04/13/2020, 8:32 AM  [facility] [address] [place] [address]Phone: [phone]  Fax:  [phone] Name: William [name]  MRN: [id number] Date of Birth: 03-27-1961

## 2020-04-21 ENCOUNTER — Other Ambulatory Visit: Payer: Self-pay

## 2020-04-21 ENCOUNTER — Ambulatory Visit: Payer: PPO | Admitting: Physical Therapy

## 2020-04-21 DIAGNOSIS — M25511 Pain in right shoulder: Secondary | ICD-10-CM

## 2020-04-21 DIAGNOSIS — M6281 Muscle weakness (generalized): Secondary | ICD-10-CM

## 2020-04-21 NOTE — Therapy (Signed)
[facility] [address] [place], [name], [address] Phone: [phone]   Fax:  [phone]  Physical Therapy Treatment/Recertification   Patient Details  Name: William [name] MRN: [id number] Date of Birth: 1960/10/14 Referring Provider (PT): Dr. [name]   Encounter Date: 04/21/2020   PT End of Session - 04/21/20 1807    Visit Number 13    Date for PT Re-Evaluation 06/16/20    Authorization Type [employer]    PT Start Time 0845    PT Stop Time 0927    PT Time Calculation (min) 42 min    Activity Tolerance Patient tolerated treatment well           Past Medical History:  Diagnosis Date   Allergy    Anxiety    Depression    Gastroparesis    Heart murmur    Migraine headache    Mitral valve prolapse    SVT (supraventricular tachycardia) ([place])     No past surgical history on file.  There were no vitals filed for this visit.   Subjective Assessment - 04/21/20 0846    Subjective I put my shoulder to the test.  I can do some weights.  Where I see the limitation is (external rotation)  it gets tired quickly.    Pertinent History supraspinatus tendinosis; mild A/C arthritis;  history of LBP;  anxiety,depression, migraines;  previous pt of BF [name], [place])    Limitations Sitting;Standing;House hold activities    Diagnostic tests MRI shoulder supraspinatus tendonosis and mild AC arthritis;    stenosis L3-4, L4-5 and L5-S1    Currently in Pain? No/denies    Pain Score 0-No pain    Pain Location Shoulder    Pain Orientation Right              [facility] PT Assessment - 04/21/20 0001      Observation/Other Assessments   Focus on Therapeutic Outcomes (FOTO)  20% limitation      AROM   Right Shoulder Flexion 175 Degrees    Right Shoulder ABduction 178 Degrees    Right Shoulder Internal Rotation --   T8   Right Shoulder External Rotation 90 Degrees      Strength   Overall Strength  Comments middle and lower traps 4/5    Right Shoulder Flexion 4+/5    Right Shoulder Extension 5/5    Right Shoulder ABduction 4/5    Right Shoulder Internal Rotation 5/5    Right Shoulder External Rotation 4+/5                         [facility] Adult PT Treatment/Exercise - 04/21/20 0001      Shoulder Exercises: Prone   Other Prone Exercises up and over targets T and Y 10x each      Shoulder Exercises: Standing   Other Standing Exercises External rotation at 90 degrees with side stepping    Other Standing Exercises 5# kettlebell snatch to shoulder then press overhead 10x right/left      Shoulder Exercises: ROM/Strengthening   Pushups 10 reps    Pushups Limitations counter top    Other ROM/Strengthening Exercises UE s on counter with alternating shoulder taps 10x                    PT Short Term Goals - 04/21/20 1813      PT SHORT TERM GOAL #1   Title Pt will be  independent with his initial HEP to promote healing    Status Achieved      PT SHORT TERM GOAL #2   Title The patient will report a 40% improvement in shoulder pain with reaching and lifting medium objects    Status Achieved      PT SHORT TERM GOAL #3   Title Right shoulder internal rotation behind the back to T10 needed for dressing/ grooming    Status Achieved             PT Long Term Goals - 04/21/20 0854      PT LONG TERM GOAL #1   Title Pt will be independent with his advanced HEP to improve ROM and  strength  with daily activity after d/c from PT.    Time 8    Period Weeks    Status Partially Met    Target Date 06/16/20      PT LONG TERM GOAL #2   Title Pt will report  at least 60% improvement in Rt UE pain and use throughout the day.    Status Achieved      PT LONG TERM GOAL #3   Title The patient will have grossly 5-/5 right shoulder strength needed for lifting    Time 12    Period Weeks    Status Partially Met      PT LONG TERM GOAL #4   Title FOTO functional outcome  score improved from 46% limitation to 41%    Status Achieved      PT LONG TERM GOAL #5   Title Pt will be able to reach overhead into a cabinet with a medium weight/box x10 reps without increase in Rt shoulder pain.    Time 8    Period Weeks    Status Revised      PT LONG TERM GOAL #6   Title Able to liftt/carry a 24 pack of bottled water from the floor and not get hurt    Time 8    Status New      PT LONG TERM GOAL #7   Title Able to lift a box of Christmas decorations overhead on/off shelf    Time 8    Period Weeks    Status New      PT LONG TERM GOAL #8   Title Lift the spare tire out of the trunk and not get hurt    Time 8    Period Weeks    Status New                 Plan - 04/21/20 1808    Clinical Impression Statement The patient reports an overall improvement at 85%.  He reports improving pain with primary complaint at this point as weakness and quick muscle fatigue of his shoulder.  His ROM is now full and painfree.  Improving strength although limited with external rotation at 0 degrees abduction and with power to lift heavier objects especially overhead.  Therapist closely monitoring response throughout treatment session.  New functional goals added.    Comorbidities chronic history spinal pain, anxiety, depression, migraines    Rehab Potential Good    PT Frequency 1x / week    PT Duration 12 weeks    PT Treatment/Interventions ADLs/Self Care Home Management;Electrical Stimulation;Cryotherapy;Aquatic Therapy;Moist Heat;Traction;Ultrasound;Neuromuscular re-education;Therapeutic exercise;Therapeutic activities;Patient/family education;Manual techniques;Dry needling;Taping;Spinal Manipulations;Iontophoresis 31m/ml Dexamethasone    PT Next Visit Plan Progressive strengthening: counter top and progressively lower height push ups/weight bearing;  kettle bell ex's; landmines;  external rotation strengthening;  middle and lower traps; rotator cuff strengthening            Patient will benefit from skilled therapeutic intervention in order to improve the following deficits and impairments:  Decreased range of motion,Pain,Impaired perceived functional ability,Decreased strength,Postural dysfunction,Improper body mechanics,Increased muscle spasms  Visit Diagnosis: Right shoulder pain, unspecified chronicity - Plan: PT plan of care cert/re-cert  Muscle weakness (generalized) - Plan: PT plan of care cert/re-cert     Problem List Patient Active Problem List   Diagnosis Date Noted   Arthritis of right acromioclavicular joint 02/18/2020   Right rotator cuff tear 12/18/2019   Degenerative cervical disc 05/07/2019   Left lumbar radiculopathy 10/10/2017   Migraine without aura and without status migrainosus, not intractable 06/22/2017   Vitamin D deficiency 05/28/2017   Scrotal pain 05/28/2017   Paroxysmal SVT (supraventricular tachycardia) ([place]) 05/28/2017   Mild left inguinal hernia 11/23/2016   Chronic pelvic pain in male 09/28/2016   Abdominal pain, left lower quadrant 09/28/2016   Isolated proteinuria without specific morphologic lesion 06/27/2016   Left testicular pain 06/27/2016   Acute left flank pain 06/27/2016   Epididymitis, left 06/05/2016   Pelvic floor dysfunction 04/01/2016   GERD (gastroesophageal reflux disease) 10/13/2015   Gastroparesis 10/13/2015   Intractable chronic migraine without aura and without status migrainosus 12/15/2013   Hyperlipidemia 04/04/2012   Mitral valve disease 07/15/2009   IRRITABLE BOWEL SYNDROME 07/15/2009   Palpitations 06/28/2009   Ruben Im, PT 04/21/20 6:17 PM Phone: [phone] Fax: [phone] Alvera Singh 04/21/2020, 6:17 PM  [facility] [address], [place], [name], [address] Phone: [phone]   Fax:  [phone]  Name: William [name] MRN: [id number] Date of Birth: Mar 20, 1961

## 2020-05-03 ENCOUNTER — Encounter: Payer: Self-pay | Admitting: Neurology

## 2020-05-03 NOTE — Progress Notes (Signed)
William [name] (Key: [id number]) Nurtec 75MG  dispersible tablets   Form [employer] 4-Part Electronic PA Form [id number] NCPDP) Created 3 days ago Sent to Plan less than a minute ago Plan Response less than a minute ago Submit Clinical Questions Determination N/A Message from Plan Available without authorization.

## 2020-05-05 ENCOUNTER — Ambulatory Visit: Payer: PPO | Admitting: Physical Therapy

## 2020-05-11 ENCOUNTER — Other Ambulatory Visit: Payer: PPO

## 2020-05-12 ENCOUNTER — Other Ambulatory Visit: Payer: PPO

## 2020-05-19 ENCOUNTER — Ambulatory Visit: Payer: PPO | Admitting: Physical Therapy

## 2020-05-23 DIAGNOSIS — R002 Palpitations: Secondary | ICD-10-CM | POA: Diagnosis not present

## 2020-05-23 DIAGNOSIS — F4323 Adjustment disorder with mixed anxiety and depressed mood: Secondary | ICD-10-CM | POA: Diagnosis not present

## 2020-05-23 DIAGNOSIS — K3184 Gastroparesis: Secondary | ICD-10-CM | POA: Diagnosis not present

## 2020-05-23 DIAGNOSIS — S90422A Blister (nonthermal), left great toe, initial encounter: Secondary | ICD-10-CM | POA: Diagnosis not present

## 2020-05-23 DIAGNOSIS — G43719 Chronic migraine without aura, intractable, without status migrainosus: Secondary | ICD-10-CM | POA: Diagnosis not present

## 2020-05-23 DIAGNOSIS — M7541 Impingement syndrome of right shoulder: Secondary | ICD-10-CM | POA: Diagnosis not present

## 2020-05-23 DIAGNOSIS — I471 Supraventricular tachycardia: Secondary | ICD-10-CM | POA: Diagnosis not present

## 2020-05-23 DIAGNOSIS — J3489 Other specified disorders of nose and nasal sinuses: Secondary | ICD-10-CM | POA: Diagnosis not present

## 2020-05-23 DIAGNOSIS — M5414 Radiculopathy, thoracic region: Secondary | ICD-10-CM | POA: Diagnosis not present

## 2020-05-23 DIAGNOSIS — M5010 Cervical disc disorder with radiculopathy, unspecified cervical region: Secondary | ICD-10-CM | POA: Diagnosis not present

## 2020-05-23 DIAGNOSIS — F438 Other reactions to severe stress: Secondary | ICD-10-CM | POA: Diagnosis not present

## 2020-05-23 DIAGNOSIS — L853 Xerosis cutis: Secondary | ICD-10-CM | POA: Diagnosis not present

## 2020-06-01 ENCOUNTER — Encounter: Payer: Self-pay | Admitting: Physical Therapy

## 2020-06-01 ENCOUNTER — Other Ambulatory Visit: Payer: Self-pay

## 2020-06-01 ENCOUNTER — Ambulatory Visit: Payer: PPO | Attending: Family Medicine | Admitting: Physical Therapy

## 2020-06-01 DIAGNOSIS — M25511 Pain in right shoulder: Secondary | ICD-10-CM | POA: Diagnosis not present

## 2020-06-01 DIAGNOSIS — M6281 Muscle weakness (generalized): Secondary | ICD-10-CM | POA: Diagnosis not present

## 2020-06-01 DIAGNOSIS — M5442 Lumbago with sciatica, left side: Secondary | ICD-10-CM | POA: Insufficient documentation

## 2020-06-01 DIAGNOSIS — M62838 Other muscle spasm: Secondary | ICD-10-CM | POA: Insufficient documentation

## 2020-06-01 DIAGNOSIS — G8929 Other chronic pain: Secondary | ICD-10-CM | POA: Diagnosis not present

## 2020-06-01 NOTE — Therapy (Addendum)
[facility] [address] [place], [name], [address] Phone: [phone]   Fax:  [phone]  Physical Therapy Treatment/Discharge Summary  Patient Details  Name: William [name] MRN: [id number] Date of Birth: December 07, 1960 Referring Provider (PT): Dr. [name]   Encounter Date: 06/01/2020   PT End of Session - 06/01/20 0851    Visit Number 14    Date for PT Re-Evaluation 06/16/20    Authorization Type [employer]    PT Start Time 0848    PT Stop Time 0921    PT Time Calculation (min) 33 min    Activity Tolerance Patient tolerated treatment well    Behavior During Therapy [facility] for tasks assessed/performed           Past Medical History:  Diagnosis Date  . Allergy   . Anxiety   . Depression   . Gastroparesis   . Heart murmur   . Migraine headache   . Mitral valve prolapse   . SVT (supraventricular tachycardia) ([place])     History reviewed. No pertinent surgical history.  There were no vitals filed for this visit.   Subjective Assessment - 06/01/20 0853    Subjective I had to change my attitude and that has helped me push forward. I bought a set of 3 kettle bells for home.    Pertinent History supraspinatus tendinosis; mild A/C arthritis;  history of LBP;  anxiety,depression, migraines;  previous pt of BF [name], [place])    Currently in Pain? No/denies    Multiple Pain Sites No                             [facility] Adult PT Treatment/Exercise - 06/01/20 0001      Neck Exercises: Machines for Strengthening   UBE (Upper Arm Bike) L2 3x3 with discussion of current status      Shoulder Exercises: Prone   Other Prone Exercises rows 3# 2x10, extension 2# 2x10, horizontal abd 0# 2x10      Shoulder Exercises: Standing   Other Standing Exercises 5# kettle bell: upright rows 2x10, front raise x10 added to HEP      Shoulder Exercises: ROM/Strengthening   Pushups 10 reps   2 sets    Pushups Limitations counter top                    PT Short Term Goals - 04/21/20 1813      PT SHORT TERM GOAL #1   Title Pt will be independent with his initial HEP to promote healing    Status Achieved      PT SHORT TERM GOAL #2   Title The patient will report a 40% improvement in shoulder pain with reaching and lifting medium objects    Status Achieved      PT SHORT TERM GOAL #3   Title Right shoulder internal rotation behind the back to T10 needed for dressing/ grooming    Status Achieved             PT Long Term Goals - 04/21/20 0854      PT LONG TERM GOAL #1   Title Pt will be independent with his advanced HEP to improve ROM and  strength  with daily activity after d/c from PT.    Time 8    Period Weeks    Status Partially Met    Target Date 06/16/20  PT LONG TERM GOAL #2   Title Pt will report  at least 60% improvement in Rt UE pain and use throughout the day.    Status Achieved      PT LONG TERM GOAL #3   Title The patient will have grossly 5-/5 right shoulder strength needed for lifting    Time 12    Period Weeks    Status Partially Met      PT LONG TERM GOAL #4   Title FOTO functional outcome score improved from 46% limitation to 41%    Status Achieved      PT LONG TERM GOAL #5   Title Pt will be able to reach overhead into a cabinet with a medium weight/box x10 reps without increase in Rt shoulder pain.    Time 8    Period Weeks    Status Revised      PT LONG TERM GOAL #6   Title Able to liftt/carry a 24 pack of bottled water from the floor and not get hurt    Time 8    Status New      PT LONG TERM GOAL #7   Title Able to lift a box of Christmas decorations overhead on/off shelf    Time 8    Period Weeks    Status New      PT LONG TERM GOAL #8   Title Lift the spare tire out of the trunk and not get hurt    Time 8    Period Weeks    Status New                 Plan - 06/01/20 8280    Clinical Impression Statement  Pt continues to do well. He purchaseda set of [employer] for home. Today we learned a few exercises he can do home with them which pt demonstrated with excellent technique. Pt was able to perform all exercises pain free but he definitely had fatigue in his arms and shoulders.    Personal Factors and Comorbidities Comorbidity 1;Comorbidity 2;Comorbidity 3+;Past/Current Experience    Comorbidities chronic history spinal pain, anxiety, depression, migraines    Examination-Activity Limitations Lift;Caring for Others    Examination-Participation Restrictions Interpersonal Relationship    Stability/Clinical Decision Making Stable/Uncomplicated    Rehab Potential Good    PT Frequency 1x / week    PT Duration 12 weeks    PT Treatment/Interventions ADLs/Self Care Home Management;Electrical Stimulation;Cryotherapy;Aquatic Therapy;Moist Heat;Traction;Ultrasound;Neuromuscular re-education;Therapeutic exercise;Therapeutic activities;Patient/family education;Manual techniques;Dry needling;Taping;Spinal Manipulations;Iontophoresis 72m/ml Dexamethasone    PT Next Visit Plan See how kettle bell exercise are going, probable DC if pt continues to do well.    PT Home Exercise Plan [id number]   Consulted and Agree with Plan of Care Patient           Patient will benefit from skilled therapeutic intervention in order to improve the following deficits and impairments:  Decreased range of motion,Pain,Impaired perceived functional ability,Decreased strength,Postural dysfunction,Improper body mechanics,Increased muscle spasms  Visit Diagnosis: Right shoulder pain, unspecified chronicity  Muscle weakness (generalized)  Chronic left-sided low back pain with left-sided sciatica  Other muscle spasm  PHYSICAL THERAPY DISCHARGE SUMMARY  Visits from Start of Care: 14  Current functional level related to goals / functional outcomes: The patient has not returned for treatment of his shoulder since 2/2.  Will  discharge from PT at this time.    Remaining deficits: As above   Education / Equipment: Comprehensive HEP Plan:  Patient goals were partially met. Patient is being discharged due to not returning since the last visit.  ?????          Problem List Patient Active Problem List   Diagnosis Date Noted  . Arthritis of right acromioclavicular joint 02/18/2020  . Right rotator cuff tear 12/18/2019  . Degenerative cervical disc 05/07/2019  . Left lumbar radiculopathy 10/10/2017  . Migraine without aura and without status migrainosus, not intractable 06/22/2017  . Vitamin D deficiency 05/28/2017  . Scrotal pain 05/28/2017  . Paroxysmal SVT (supraventricular tachycardia) ([place]) 05/28/2017  . Mild left inguinal hernia 11/23/2016  . Chronic pelvic pain in male 09/28/2016  . Abdominal pain, left lower quadrant 09/28/2016  . Isolated proteinuria without specific morphologic lesion 06/27/2016  . Left testicular pain 06/27/2016  . Acute left flank pain 06/27/2016  . Epididymitis, left 06/05/2016  . Pelvic floor dysfunction 04/01/2016  . GERD (gastroesophageal reflux disease) 10/13/2015  . Gastroparesis 10/13/2015  . Intractable chronic migraine without aura and without status migrainosus 12/15/2013  . Hyperlipidemia 04/04/2012  . Mitral valve disease 07/15/2009  . IRRITABLE BOWEL SYNDROME 07/15/2009  . Palpitations 06/28/2009   Ruben Im, PT 07/15/20 7:34 AM Phone: [phone] Fax: [phone] Jamarco Zaldivar, PTA 06/01/2020, 9:22 AM  [facility] [address] [place], [name], [address] Phone: [phone]   Fax:  [phone]  Name: Jerryl Holzhauer MRN: [id number] Date of Birth: [birth year]

## 2020-06-01 NOTE — Patient Instructions (Signed)
Kettle Bell exercises:  1: kettle bell to chin. Elbows go out WIDE.Marland KitchenMarland KitchenMarland Kitchen2x10 then progress to 2x15 and eventually 2x20 2: Straight arm lift to 90 degrees: Same reps as above  Bonus exercise: 1. Hold kettle bell down and "sit in the chair" then stand up, buttocks sit BACK 2x10, same reps as above

## 2020-06-03 DIAGNOSIS — M5010 Cervical disc disorder with radiculopathy, unspecified cervical region: Secondary | ICD-10-CM | POA: Diagnosis not present

## 2020-06-03 DIAGNOSIS — E538 Deficiency of other specified B group vitamins: Secondary | ICD-10-CM | POA: Diagnosis not present

## 2020-06-03 DIAGNOSIS — R002 Palpitations: Secondary | ICD-10-CM | POA: Diagnosis not present

## 2020-06-03 DIAGNOSIS — E559 Vitamin D deficiency, unspecified: Secondary | ICD-10-CM | POA: Diagnosis not present

## 2020-06-03 DIAGNOSIS — R102 Pelvic and perineal pain: Secondary | ICD-10-CM | POA: Diagnosis not present

## 2020-06-03 DIAGNOSIS — R110 Nausea: Secondary | ICD-10-CM | POA: Diagnosis not present

## 2020-06-03 DIAGNOSIS — F4323 Adjustment disorder with mixed anxiety and depressed mood: Secondary | ICD-10-CM | POA: Diagnosis not present

## 2020-06-03 DIAGNOSIS — I471 Supraventricular tachycardia: Secondary | ICD-10-CM | POA: Diagnosis not present

## 2020-06-03 DIAGNOSIS — F438 Other reactions to severe stress: Secondary | ICD-10-CM | POA: Diagnosis not present

## 2020-06-03 DIAGNOSIS — M79673 Pain in unspecified foot: Secondary | ICD-10-CM | POA: Diagnosis not present

## 2020-06-03 DIAGNOSIS — G43719 Chronic migraine without aura, intractable, without status migrainosus: Secondary | ICD-10-CM | POA: Diagnosis not present

## 2020-06-03 DIAGNOSIS — M7541 Impingement syndrome of right shoulder: Secondary | ICD-10-CM | POA: Diagnosis not present

## 2020-06-14 NOTE — Progress Notes (Unsigned)
[place] Phone: [phone] Subjective:   William [name], am serving as a scribe for Dr. Hulan Saas. This visit occurred during the SARS-CoV-2 public health emergency.  Safety protocols were in place, including screening questions prior to the visit, additional usage of staff PPE, and extensive cleaning of exam room while observing appropriate contact time as indicated for disinfecting solutions.   I'm seeing this patient by the request  of:  Shawnee Knapp, MD  CC: Back pain  [id number]   02/18/2020 Injection given today.  Tolerated the procedure well.  Discussed icing regimen and home exercise, which activities to doing which wants to avoid.  Increase activity slowly.  Follow-up again in 5 weeks.  Continue to have pain due to subacromial bursitis injection.  Update 06/15/2020 William [name] is a 60 y.o. male coming in with complaint of right shoulder pain. Pain has improved in shoulder. One more session with PT.   States that on Monday, he was in forward bent position, leaning on left arm for one hour. When he stood up he has pain radiating into left arm and then down through his back down to left foot. Pain is constant, 8/10. Has been using baclofen for pain relief as well as pain patches. Has had this happen once before after sitting on a softer couch.     MRI lumbar 2019- Mild subarticular stenosis bilaterally L3-4  Disc and facet degeneration at L4-5 with small central disc protrusion. Moderate subarticular and foraminal stenosis bilaterally  Moderate subarticular and foraminal stenosis bilaterally L5-S1 due to spurring.  Past Medical History:  Diagnosis Date  . Allergy   . Anxiety   . Depression   . Gastroparesis   . Heart murmur   . Migraine headache   . Mitral valve prolapse   . SVT (supraventricular tachycardia) (HCC)    [name] past surgical history on file. Social History    Socioeconomic History  . Marital status: Married    Spouse name: Not on file  . Number of children: 0  . Years of education: Not on file  . Highest education level: Bachelor's degree (e.g., BA, AB, BS)  Occupational History  . Not on file  Tobacco Use  . Smoking status: Never Smoker  . Smokeless tobacco: Never Used  Vaping Use  . Vaping Use: Never used  Substance and Sexual Activity  . Alcohol use: [name]    Alcohol/week: 0.0 standard drinks  . Drug use: [name]  . Sexual activity: Yes    Partners: Female  Other Topics Concern  . Not on file  Social History Narrative   Pt is married lives with spouse in 1 story apartment he has [name] children   BS degree- he is right handed   Drinks soda  Ginger ale, [name] coffee [name] tea   Social Determinants of Radio broadcast assistant Strain: Not on file  Food Insecurity: Not on file  Transportation Needs: Not on file  Physical Activity: Not on file  Stress: Not on file  Social Connections: Not on file   Allergies  Allergen Reactions  . Erythromycin Anaphylaxis and Nausea And Vomiting  . Nalbuphine Other (See Comments)    States loss of consciousness   . Cefdinir Diarrhea, Nausea And Vomiting and Rash  . Cephalexin Diarrhea and Nausea And Vomiting  . Cephalosporins Diarrhea and Nausea And Vomiting  . Hydrocodone-Acetaminophen Nausea And Vomiting  . Ketorolac Nausea And Vomiting  .  Prochlorperazine Edisylate Other (See Comments)    uncontrollable shake - tremor   . Restasis [Cyclosporine] Other (See Comments)    migraines  . Tyloxapol Swelling  . Propranolol Diarrhea  . Topamax [Topiramate] Diarrhea and Nausea And Vomiting  . Zetia [Ezetimibe] Diarrhea  . Doxycycline Hyclate Nausea Only and Other (See Comments)    abd pain  . Iodinated Diagnostic Agents Other (See Comments)    Lightheaded, flushed feeling (explained to patient these are normal side effects of IV iodinated contrast, not allergies, but he wanted this noted in the epic;  he tolerated epidural steroid injections w/ contrast w/o difficulty)  . Ketoconazole Itching  . Latex Itching  . Neosporin [Neomycin-Bacitracin Zn-Polymyx] Rash  . Oxycodone-Acetaminophen Other (See Comments)    Tylox caused constipation  . Prednisone Nausea And Vomiting    Oral route, only   Family History  Problem Relation Age of Onset  . Hypertension Mother   . Hypertension Brother   . Kidney failure Maternal Grandmother   . Prostate cancer Neg Hx   . Kidney cancer Neg Hx      Current Facility-Administered Medications (Endocrine & Metabolic):  .  methylPREDNISolone acetate (DEPO-MEDROL) injection 40 mg  Current Outpatient Medications (Cardiovascular):  .  diltiazem (CARDIZEM CD) 180 MG 24 hr capsule, Take 1 capsule (180 mg total) by mouth daily. [name]  diltiazem (CARDIZEM) 30 MG tablet, TAKE (1) TABLET AS NEEDED FOR PALPITATIONS   Current Outpatient Medications (Respiratory):  .  fluticasone (FLONASE) 50 MCG/ACT nasal spray, Place 2 sprays into both nostrils daily.   Current Outpatient Medications (Analgesics):  [name]  EMGALITY 120 MG/ML SOAJ,  .  Rimegepant Sulfate (NURTEC) 75 MG TBDP, Take 75 mg by mouth as needed for up to 1 dose (Maximum 1 tablet in 24 hours.).     Current Outpatient Medications (Other):  .  baclofen (LIORESAL) 10 MG tablet, Take 10 mg by mouth 3 (three) times daily. .  metoCLOPramide (REGLAN) 5 MG tablet, Take 5 mg by mouth as needed. .  ondansetron (ZOFRAN) 4 MG tablet, Take 4 mg by mouth as needed.     Reviewed prior external information including notes and imaging from  primary care provider As well as notes that were available from care everywhere and other healthcare systems.  Past medical history, social, surgical and family history all reviewed in electronic medical record.  [name] pertanent information unless stated regarding to the chief complaint.   Review of Systems:  [name] headache, visual changes, nausea, vomiting, diarrhea, constipation,  dizziness, abdominal pain, skin rash, fevers, chills, night sweats, weight loss, swollen lymph nodes, body aches, joint swelling, chest pain, shortness of breath, mood changes. POSITIVE muscle aches  Objective  Blood pressure 108/76, pulse 73, height 5\' 10"  (1.778 m), weight 144 lb (65.3 kg), SpO2 98 %.   General: [name] apparent distress alert and oriented x3 mood and affect normal, dressed appropriately.  HEENT: Pupils equal, extraocular movements intact  Respiratory: Patient's speak in full sentences and does not appear short of breath  Cardiovascular: [name] lower extremity edema, non tender, [name] erythema  Gait normal with good balance and coordination.  MSK: Right shoulder exam shows the patient does have good range of motion.  Left shoulder exam also has good range of motion 5 out of 5 strength.  Patient's neck though does have some tightness noted especially with side bending and rotation to the left.  Negative [name] Spurling's noted today.  5 out of 5 strength of the upper extremities in the  hands. Low back exam does have tightness noted in the paraspinal musculature bilaterally but left better than right.  Mild positive straight leg test on the left side with radicular symptoms in the L5 area.  [name] significant weakness noted.  Deep tendon reflexes are intact in the Achilles and symmetric.  After verbal consent patient was given a shot of Depo Medrol 40 mg per 1 mL into the left buttocks cheek with a 25-gauge half inch needle after sterile technique and cleaning was done with alcohol swabs..  [name] blood loss.  Band-Aid placed.  Postinjection instructions given.    Impression and Recommendations:     The above documentation has been reviewed and is accurate and complete Lyndal Pulley, DO

## 2020-06-15 ENCOUNTER — Encounter: Payer: Self-pay | Admitting: Family Medicine

## 2020-06-15 ENCOUNTER — Ambulatory Visit: Payer: Self-pay

## 2020-06-15 ENCOUNTER — Ambulatory Visit: Payer: PPO | Admitting: Family Medicine

## 2020-06-15 ENCOUNTER — Other Ambulatory Visit: Payer: Self-pay

## 2020-06-15 VITALS — BP 108/76 | HR 73 | Ht 70.0 in | Wt 144.0 lb

## 2020-06-15 DIAGNOSIS — M5030 Other cervical disc degeneration, unspecified cervical region: Secondary | ICD-10-CM | POA: Diagnosis not present

## 2020-06-15 DIAGNOSIS — M5416 Radiculopathy, lumbar region: Secondary | ICD-10-CM

## 2020-06-15 DIAGNOSIS — M25511 Pain in right shoulder: Secondary | ICD-10-CM | POA: Diagnosis not present

## 2020-06-15 DIAGNOSIS — G8929 Other chronic pain: Secondary | ICD-10-CM | POA: Diagnosis not present

## 2020-06-15 DIAGNOSIS — M2550 Pain in unspecified joint: Secondary | ICD-10-CM

## 2020-06-15 MED ORDER — METHYLPREDNISOLONE ACETATE 80 MG/ML IJ SUSP
40.0000 mg | Freq: Once | INTRAMUSCULAR | Status: AC
  Administered 2020-06-15: 40 mg via INTRAMUSCULAR

## 2020-06-15 NOTE — Assessment & Plan Note (Signed)
Tightness noted.  No significant joint radicular symptoms with Spurling's but patient states it is radiating.  Patient has been on gabapentin previously but would like to avoid being on them regularly.  We will monitor closely.  Follow-up again in 3-4 weeks

## 2020-06-15 NOTE — Assessment & Plan Note (Addendum)
Patient is having some mild increase in the left lumbar radiculopathy.  Patient has had imaging previously that did show potential nerve impingement and patient responded well to the injection.  Patient though is only having the pain for the last 2 days.  Patient will be given Depo-Medrol injections which patient has responded to in the past.  Patient has had significant GI discomfort with multiple oral anti-inflammatories and prednisone so we will hold at this time.  No significant change in the other medications that the baclofen does seem to help and can take it regularly during this exacerbation.  Encouraged him to do the home exercises regularly.  Worsening pain seek medical attention immediately.  No abdominal pain though noted today.  Follow-up with me again in 3 to 4 weeks otherwise if worsening pain we can also consider the possibility of epidural.

## 2020-06-15 NOTE — Patient Instructions (Signed)
Ice 20 min 2 x a day Injections in back side Send message on Monday Follow-up in 3-4 weeks

## 2020-06-16 ENCOUNTER — Encounter: Payer: PPO | Admitting: Physical Therapy

## 2020-06-20 ENCOUNTER — Telehealth: Payer: Self-pay | Admitting: *Deleted

## 2020-06-20 NOTE — Telephone Encounter (Signed)
Pt called to provide an update to Dr. Tamala Julian. Per the pt, the injection helped however he is still having pain down the left leg whenever he bends at the waist.

## 2020-06-21 NOTE — Telephone Encounter (Signed)
Could consider epidural again as well, we did last one in 2019.

## 2020-06-21 NOTE — Telephone Encounter (Signed)
Left message for patient to call back  

## 2020-06-23 DIAGNOSIS — M7541 Impingement syndrome of right shoulder: Secondary | ICD-10-CM | POA: Diagnosis not present

## 2020-06-23 DIAGNOSIS — N50812 Left testicular pain: Secondary | ICD-10-CM | POA: Diagnosis not present

## 2020-06-23 DIAGNOSIS — F4323 Adjustment disorder with mixed anxiety and depressed mood: Secondary | ICD-10-CM | POA: Diagnosis not present

## 2020-06-23 DIAGNOSIS — G43719 Chronic migraine without aura, intractable, without status migrainosus: Secondary | ICD-10-CM | POA: Diagnosis not present

## 2020-06-23 DIAGNOSIS — M6289 Other specified disorders of muscle: Secondary | ICD-10-CM | POA: Diagnosis not present

## 2020-06-23 DIAGNOSIS — R102 Pelvic and perineal pain: Secondary | ICD-10-CM | POA: Diagnosis not present

## 2020-06-23 DIAGNOSIS — F438 Other reactions to severe stress: Secondary | ICD-10-CM | POA: Diagnosis not present

## 2020-06-23 DIAGNOSIS — I10 Essential (primary) hypertension: Secondary | ICD-10-CM | POA: Diagnosis not present

## 2020-06-23 DIAGNOSIS — R002 Palpitations: Secondary | ICD-10-CM | POA: Diagnosis not present

## 2020-06-23 DIAGNOSIS — E538 Deficiency of other specified B group vitamins: Secondary | ICD-10-CM | POA: Diagnosis not present

## 2020-06-23 DIAGNOSIS — K3184 Gastroparesis: Secondary | ICD-10-CM | POA: Diagnosis not present

## 2020-06-23 DIAGNOSIS — I471 Supraventricular tachycardia: Secondary | ICD-10-CM | POA: Diagnosis not present

## 2020-06-24 ENCOUNTER — Ambulatory Visit: Payer: PPO | Admitting: Neurology

## 2020-06-24 IMAGING — XA Imaging study
2 series · 2 of 2 positions shown · non-contrast
Comparison: none

CLINICAL DATA: Lumbosacral spondylosis without myelopathy. Low back
and left lower extremity pain. 40-50% improvement following the
first epidural injection.

[Series 1: ortho standard · 1 of 1 slices shown (1 of 2)]
[im 1/1]
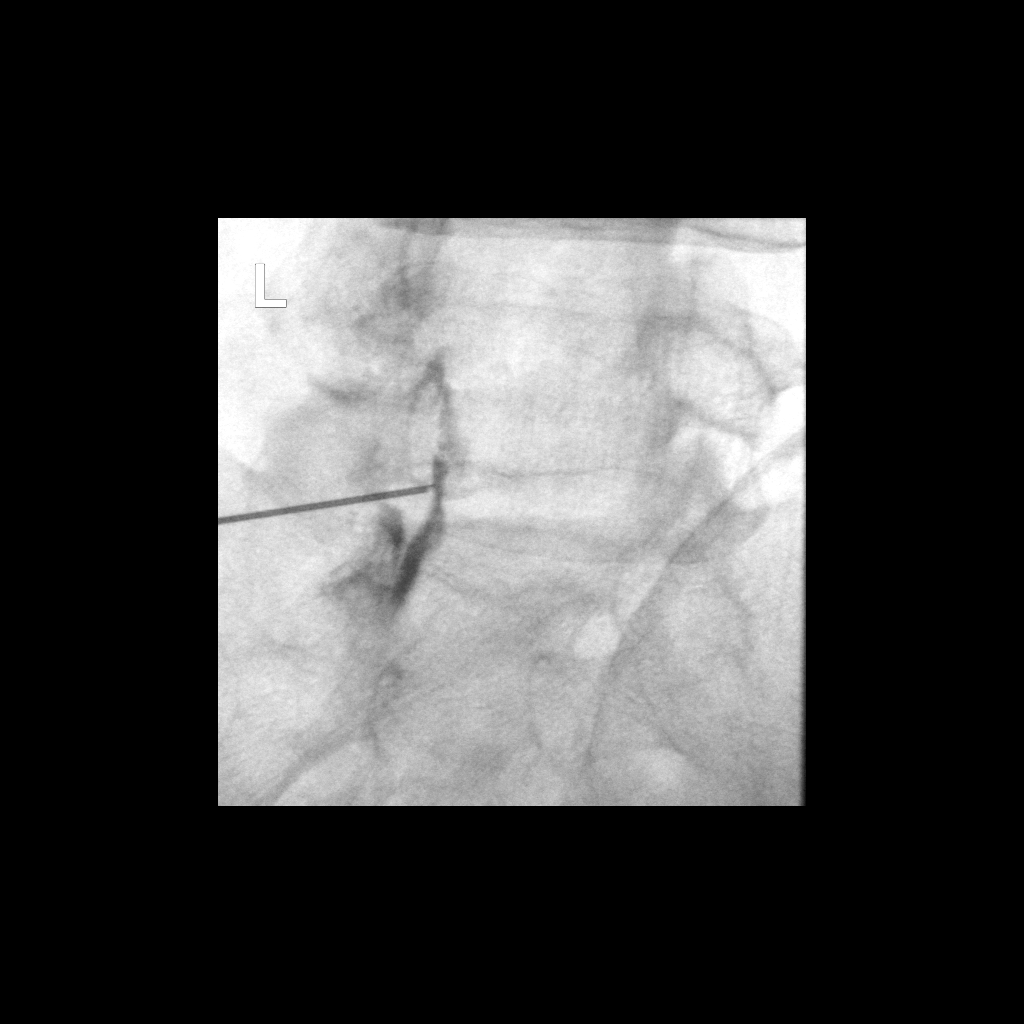

[Series 2: ortho standard · 1 of 1 slices shown (2 of 2)]
[im 1/1]
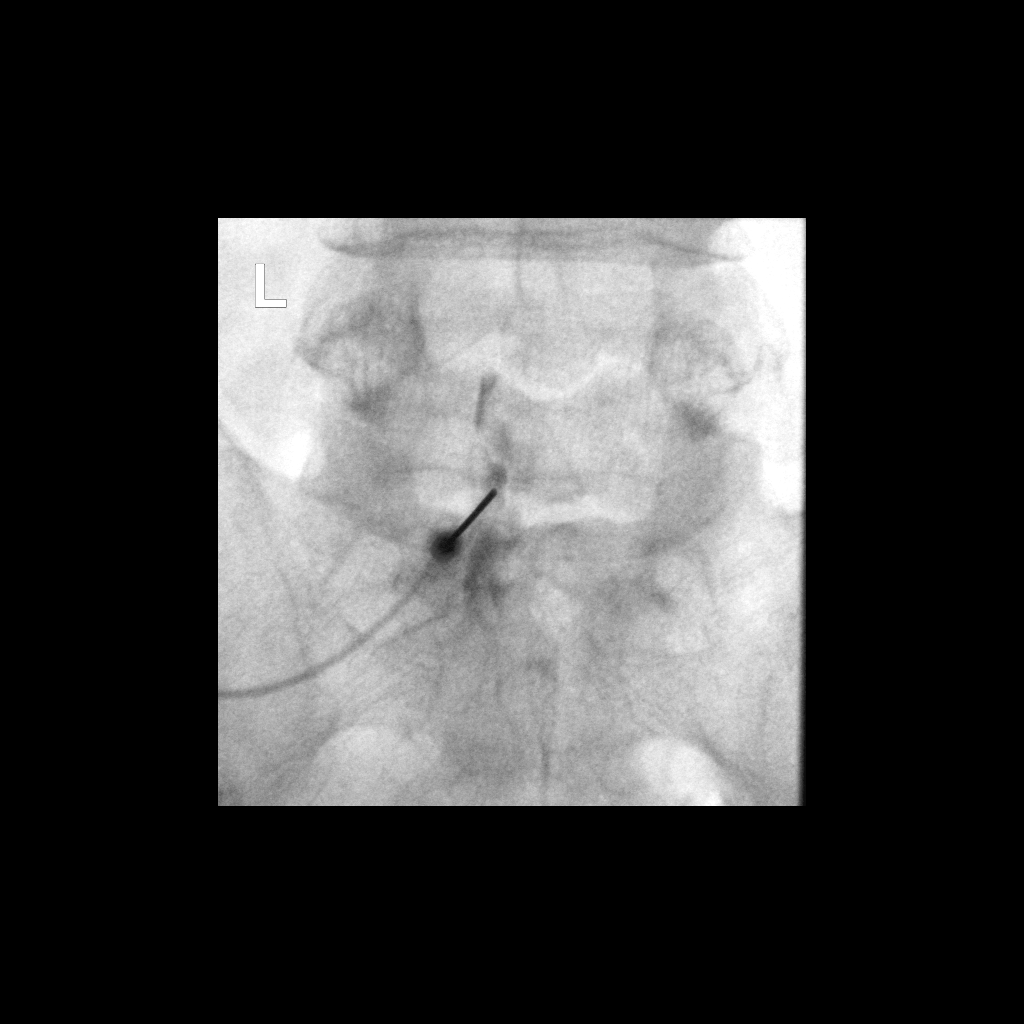

[2 of 2 positions shown; findings below may reference images not displayed]

FLUOROSCOPY TIME:  Radiation Exposure Index (as provided by the
fluoroscopic device): 5.86 microGray*m^2

Fluoroscopy Time (in minutes and seconds):  5 seconds

PROCEDURE:
The procedure, risks, benefits, and alternatives were explained to
the patient. Questions regarding the procedure were encouraged and
answered. The patient understands and consents to the procedure.

LUMBAR EPIDURAL INJECTION:

An interlaminar approach was performed on the left at L5-S1. The
overlying skin was cleansed and anesthetized. A 3.5 inch 20 gauge
epidural needle was advanced using loss-of-resistance technique.

DIAGNOSTIC EPIDURAL INJECTION:

Injection of Isovue-M 200 shows a good epidural pattern with spread
above and below the level of needle placement, primarily on the
left. No vascular opacification is seen.

THERAPEUTIC EPIDURAL INJECTION:

120 mg of Depo-Medrol mixed with 3 mL of 1% lidocaine were
instilled. The procedure was well-tolerated, and the patient was
discharged thirty minutes following the injection in good condition.

COMPLICATIONS:
None
IMPRESSION: Technically successful lumbar interlaminar epidural injection on the
left at L5-S1.

## 2020-07-01 DIAGNOSIS — H109 Unspecified conjunctivitis: Secondary | ICD-10-CM | POA: Diagnosis not present

## 2020-07-06 DIAGNOSIS — I471 Supraventricular tachycardia: Secondary | ICD-10-CM | POA: Diagnosis not present

## 2020-07-06 DIAGNOSIS — F438 Other reactions to severe stress: Secondary | ICD-10-CM | POA: Diagnosis not present

## 2020-07-06 DIAGNOSIS — D519 Vitamin B12 deficiency anemia, unspecified: Secondary | ICD-10-CM | POA: Diagnosis not present

## 2020-07-06 DIAGNOSIS — G43719 Chronic migraine without aura, intractable, without status migrainosus: Secondary | ICD-10-CM | POA: Diagnosis not present

## 2020-07-06 DIAGNOSIS — F4323 Adjustment disorder with mixed anxiety and depressed mood: Secondary | ICD-10-CM | POA: Diagnosis not present

## 2020-07-06 DIAGNOSIS — R002 Palpitations: Secondary | ICD-10-CM | POA: Diagnosis not present

## 2020-07-06 DIAGNOSIS — K3184 Gastroparesis: Secondary | ICD-10-CM | POA: Diagnosis not present

## 2020-07-06 DIAGNOSIS — R102 Pelvic and perineal pain: Secondary | ICD-10-CM | POA: Diagnosis not present

## 2020-07-06 DIAGNOSIS — G8929 Other chronic pain: Secondary | ICD-10-CM | POA: Diagnosis not present

## 2020-07-06 DIAGNOSIS — E559 Vitamin D deficiency, unspecified: Secondary | ICD-10-CM | POA: Diagnosis not present

## 2020-07-06 DIAGNOSIS — M6289 Other specified disorders of muscle: Secondary | ICD-10-CM | POA: Diagnosis not present

## 2020-07-06 DIAGNOSIS — E538 Deficiency of other specified B group vitamins: Secondary | ICD-10-CM | POA: Diagnosis not present

## 2020-07-14 ENCOUNTER — Ambulatory Visit: Payer: PPO | Attending: Internal Medicine

## 2020-07-14 ENCOUNTER — Ambulatory Visit: Payer: PPO | Admitting: Family Medicine

## 2020-07-14 DIAGNOSIS — M6289 Other specified disorders of muscle: Secondary | ICD-10-CM | POA: Diagnosis not present

## 2020-07-14 DIAGNOSIS — N50812 Left testicular pain: Secondary | ICD-10-CM | POA: Diagnosis not present

## 2020-07-14 DIAGNOSIS — G8929 Other chronic pain: Secondary | ICD-10-CM | POA: Diagnosis not present

## 2020-07-14 DIAGNOSIS — R002 Palpitations: Secondary | ICD-10-CM | POA: Diagnosis not present

## 2020-07-14 DIAGNOSIS — Z20822 Contact with and (suspected) exposure to covid-19: Secondary | ICD-10-CM

## 2020-07-14 DIAGNOSIS — N5082 Scrotal pain: Secondary | ICD-10-CM | POA: Diagnosis not present

## 2020-07-14 DIAGNOSIS — F438 Other reactions to severe stress: Secondary | ICD-10-CM | POA: Diagnosis not present

## 2020-07-14 DIAGNOSIS — E538 Deficiency of other specified B group vitamins: Secondary | ICD-10-CM | POA: Diagnosis not present

## 2020-07-14 DIAGNOSIS — I471 Supraventricular tachycardia: Secondary | ICD-10-CM | POA: Diagnosis not present

## 2020-07-14 DIAGNOSIS — B356 Tinea cruris: Secondary | ICD-10-CM | POA: Diagnosis not present

## 2020-07-14 DIAGNOSIS — M7541 Impingement syndrome of right shoulder: Secondary | ICD-10-CM | POA: Diagnosis not present

## 2020-07-14 DIAGNOSIS — D519 Vitamin B12 deficiency anemia, unspecified: Secondary | ICD-10-CM | POA: Diagnosis not present

## 2020-07-14 DIAGNOSIS — R102 Pelvic and perineal pain: Secondary | ICD-10-CM | POA: Diagnosis not present

## 2020-07-14 DIAGNOSIS — F4323 Adjustment disorder with mixed anxiety and depressed mood: Secondary | ICD-10-CM | POA: Diagnosis not present

## 2020-07-15 ENCOUNTER — Ambulatory Visit: Payer: PPO | Admitting: Physical Therapy

## 2020-07-15 LAB — NOVEL CORONAVIRUS, NAA: SARS-CoV-2, NAA: NOT DETECTED

## 2020-07-15 LAB — SARS-COV-2, NAA 2 DAY TAT

## 2020-07-19 ENCOUNTER — Encounter: Payer: Self-pay | Admitting: Physical Therapy

## 2020-07-19 ENCOUNTER — Other Ambulatory Visit: Payer: Self-pay

## 2020-07-19 ENCOUNTER — Ambulatory Visit: Payer: PPO | Attending: Family Medicine | Admitting: Physical Therapy

## 2020-07-19 DIAGNOSIS — M6281 Muscle weakness (generalized): Secondary | ICD-10-CM | POA: Insufficient documentation

## 2020-07-19 DIAGNOSIS — G8929 Other chronic pain: Secondary | ICD-10-CM | POA: Insufficient documentation

## 2020-07-19 DIAGNOSIS — M5442 Lumbago with sciatica, left side: Secondary | ICD-10-CM | POA: Insufficient documentation

## 2020-07-19 DIAGNOSIS — M62838 Other muscle spasm: Secondary | ICD-10-CM | POA: Diagnosis not present

## 2020-07-19 NOTE — Therapy (Signed)
[facility] [address] [place], [name], [address] Phone: [phone]   Fax:  [phone]  Physical Therapy Evaluation  Patient Details  Name: William [name] MRN: [id number] Date of Birth: 27-Apr-1961 Referring Provider (PT): Dr. [name]   Encounter Date: 07/19/2020   PT End of Session - 07/19/20 1740    Visit Number 1    Date for PT Re-Evaluation 09/13/20    Authorization Type [employer]    PT Start Time 1530    PT Stop Time 1615    PT Time Calculation (min) 45 min    Activity Tolerance Patient tolerated treatment well           Past Medical History:  Diagnosis Date  . Allergy   . Anxiety   . Depression   . Gastroparesis   . Heart murmur   . Migraine headache   . Mitral valve prolapse   . SVT (supraventricular tachycardia) ([place])     History reviewed. No pertinent surgical history.  There were no vitals filed for this visit.    Subjective Assessment - 07/19/20 1535    Subjective I was on the phone with [facility] and in an awkward position for 1 hour. Happened in January.    Sharp shooting pain following.  Better but still present.  Had cortisone injection.  Had  previous occurrences 6-7 years ago.    Pertinent History supraspinatus tendinosis; mild A/C arthritis;  history of LBP;  anxiety,depression, migraines;  previous pt of [facility] [name], Jen)    Limitations House hold activities;Standing    How long can you walk comfortably? 1/2 mile    Diagnostic tests nothing recently    Patient Stated Goals get back to lifting 50#; be more flexible; be in the most optimal health a 60 year old can be    Currently in Pain? Yes    Pain Score 5     Pain Location Back    Pain Orientation Left    Pain Type Chronic pain    Aggravating Factors  tightness with sitting;  lifting but I need to do it carefully; bending at the waist    Pain Relieving Factors few times took muscle relaxer               [facility] PT Assessment - 07/19/20 0001      Assessment   Medical Diagnosis low back pain and weakness    Referring Provider (PT) Dr. [name]    Next MD Visit as needed    Prior Therapy at [facility] for back and shoulder      Precautions   Precautions None      Restrictions   Weight Bearing Restrictions No      Balance Screen   Has the patient fallen in the past 6 months No    Has the patient had a decrease in activity level because of a fear of falling?  No    Is the patient reluctant to leave their home because of a fear of falling?  No      Home Ecologist residence    Living Arrangements Spouse/significant other;Other relatives    Additional Comments mother lives with them      Prior Function   Level of Independence Independent with basic ADLs      Observation/Other Assessments   Focus on Therapeutic Outcomes (FOTO)  42% (goal 59%)      Posture/Postural Control   Posture/Postural Control Postural  limitations    Postural Limitations Decreased lumbar lordosis      AROM   AROM Assessment Site --   no worse with prone on elbows or partial press up   Lumbar Flexion 25    Lumbar Extension 10    Lumbar - Right Side Bend 23    Lumbar - Left Side Bend 24      Strength   Lumbar Flexion 4-/5    Lumbar Extension 4-/5      Flexibility   Soft Tissue Assessment /Muscle Length yes    Quadriceps dec left hip flexor length      Palpation   Palpation comment signficant right lumbar paraspinal spasm and tenderness      Slump test   Findings Positive    Side Left                      Objective measurements completed on examination: See above findings.       [facility] Adult PT Treatment/Exercise - 07/19/20 0001      Moist Heat Therapy   Number Minutes Moist Heat 3 Minutes    Moist Heat Location Lumbar Spine      Electrical Stimulation   Electrical Stimulation Location with DN left lumbar paraspinals    Electrical Stimulation Action 1.5  pre mod    Electrical Stimulation Parameters 8 min    Electrical Stimulation Goals Pain      Manual Therapy   Soft tissue mobilization left lumbar paraspinals            Trigger Point Dry Needling - 07/19/20 0001    Consent Given? Yes    Education Handout Provided Previously provided    Dry Needling Comments left only    Electrical Stimulation Performed with Dry Needling Yes    Lumbar multifidi Response Palpable increased muscle length                PT Education - 07/19/20 1739    Education Details dry needling aftercare reminder (pt has had in the past); trial of press ups    Person(s) Educated Patient    Methods Explanation;Demonstration    Comprehension Verbalized understanding;Returned demonstration            PT Short Term Goals - 07/19/20 1751      PT SHORT TERM GOAL #1   Title Pt will be independent with his initial HEP to promote healing and pain reduction    Time 4    Period Weeks    Status New    Target Date 08/16/20      PT SHORT TERM GOAL #2   Title The patient will report a 40% improvement in back pain with sitting, sleeping and bending    Time 4    Period Weeks    Status New      PT SHORT TERM GOAL #3   Title Lumbar flexion improved to 40, extension to 15 degrees needed for personal care, assisting his mother and usual ADLS    Time 4    Period Weeks    Status New      PT SHORT TERM GOAL #4   Title .Marland KitchenMarland Kitchen             PT Long Term Goals - 07/19/20 1753      PT LONG TERM GOAL #1   Title Pt will be independent with his advanced HEP to improve ROM and  strength  with daily activity after d/c from PT.  Time 8    Period Weeks    Status New    Target Date 09/13/20      PT LONG TERM GOAL #2   Title Pt will report  at least 60% improvement in back pain with sitting, sleeping and bending    Time 8    Period Weeks    Status New      PT LONG TERM GOAL #3   Title The patient will have grossly 4+/5 lumbar/pelvic/hip strength  needed for  lifting/carrying    Time 8    Period Weeks    Status New      PT LONG TERM GOAL #4   Title FOTO functional outcome score improved from 42% to 59%    Time 8    Period Weeks    Status New      PT LONG TERM GOAL #5   Title ...      PT LONG TERM GOAL #6   Title ...      PT LONG TERM GOAL #7   Title ...      PT LONG TERM GOAL #8   Title .Marland KitchenMarland Kitchen                  Plan - 07/19/20 1740    Clinical Impression Statement The patient reports he was doing well in managing his shoulder pain and back pain following PT over the winter until January.  He states he was on the phone with [facility] and lying on the floor in an awkward position for an hour.  After that he had severe pain in left low back radiating all the way down to his left foot on a near constant basis.  He complains of difficulty sleeping and increased pain with sitting and bending.  Significant spasm left lumbar paraspinals.  Lumbar ROM very painful and restricted: flexion 25, extension 10, right sidebend 23, left sidebend 24 degrees.  Possible extension preference.  + slump test;   Decreased left hip flexor length.  Weakness in lumbar and hip core muscles.  FOTO score is 42% indicating moderate difficulty performing usual work and household activities.  He would benefit from PT to address these deficits.    Personal Factors and Comorbidities Comorbidity 1;Comorbidity 2;Comorbidity 3+;Past/Current Experience    Comorbidities chronic history spinal pain, anxiety, depression, migraines    Examination-Activity Limitations Lift;Caring for Others;Locomotion Level;Sit;Sleep;Stand    Examination-Participation Restrictions Interpersonal Relationship;Cleaning;Community Activity;Driving    Stability/Clinical Decision Making Stable/Uncomplicated    Clinical Decision Making Low    Rehab Potential Good    PT Frequency 2x / week    PT Duration 8 weeks    PT Treatment/Interventions ADLs/Self Care Home Management;Electrical  Stimulation;Cryotherapy;Aquatic Therapy;Moist Heat;Traction;Ultrasound;Neuromuscular re-education;Therapeutic exercise;Therapeutic activities;Patient/family education;Manual techniques;Dry needling;Taping;Spinal Manipulations;Iontophoresis 4mg /ml Dexamethasone    PT Next Visit Plan assess response to DN left lumbar multifidi with ES;  trial of prone press ups for centralization    PT Home Exercise Plan [id number]           Patient will benefit from skilled therapeutic intervention in order to improve the following deficits and impairments:  Decreased range of motion,Pain,Impaired perceived functional ability,Decreased strength,Postural dysfunction,Improper body mechanics,Increased muscle spasms  Visit Diagnosis: Chronic left-sided low back pain with left-sided sciatica - Plan: PT plan of care cert/re-cert  Other muscle spasm - Plan: PT plan of care cert/re-cert  Muscle weakness (generalized) - Plan: PT plan of care cert/re-cert     Problem List Patient Active Problem List   Diagnosis Date Noted  .  Arthritis of right acromioclavicular joint 02/18/2020  . Right rotator cuff tear 12/18/2019  . Degenerative cervical disc 05/07/2019  . Left lumbar radiculopathy 10/10/2017  . Migraine without aura and without status migrainosus, not intractable 06/22/2017  . Vitamin D deficiency 05/28/2017  . Scrotal pain 05/28/2017  . Paroxysmal SVT (supraventricular tachycardia) ([place]) 05/28/2017  . Mild left inguinal hernia 11/23/2016  . Chronic pelvic pain in male 09/28/2016  . Abdominal pain, left lower quadrant 09/28/2016  . Isolated proteinuria without specific morphologic lesion 06/27/2016  . Left testicular pain 06/27/2016  . Acute left flank pain 06/27/2016  . Epididymitis, left 06/05/2016  . Pelvic floor dysfunction 04/01/2016  . GERD (gastroesophageal reflux disease) 10/13/2015  . Gastroparesis 10/13/2015  . Intractable chronic migraine without aura and without status migrainosus  12/15/2013  . Hyperlipidemia 04/04/2012  . Mitral valve disease 07/15/2009  . IRRITABLE BOWEL SYNDROME 07/15/2009  . Palpitations 06/28/2009   Ruben Im, PT 07/19/20 5:57 PM Phone: [phone] Fax: [phone] Alvera Singh 07/19/2020, 5:56 PM  [facility] [address], [place], [name], [address] Phone: [phone]   Fax:  [phone]  Name: Tiler Brandis MRN: [id number] Date of Birth: Apr 04, 1961

## 2020-07-21 ENCOUNTER — Ambulatory Visit: Payer: PPO | Admitting: Physical Therapy

## 2020-07-21 ENCOUNTER — Other Ambulatory Visit: Payer: Self-pay

## 2020-07-21 DIAGNOSIS — M5442 Lumbago with sciatica, left side: Secondary | ICD-10-CM | POA: Diagnosis not present

## 2020-07-21 DIAGNOSIS — M62838 Other muscle spasm: Secondary | ICD-10-CM

## 2020-07-21 DIAGNOSIS — M6281 Muscle weakness (generalized): Secondary | ICD-10-CM

## 2020-07-21 DIAGNOSIS — G8929 Other chronic pain: Secondary | ICD-10-CM

## 2020-07-21 NOTE — Therapy (Signed)
[facility]: [facility] [address] [place], [name], [address] Phone: [phone]   Fax:  [phone]  Physical Therapy Treatment  Patient Details  Name: William [name] MRN: [id number] Date of Birth: [birth year] Referring Provider (PT): Dr. [name]   Encounter Date: 07/21/2020   PT End of Session - 07/21/20 2041    Visit Number 2    Date for PT Re-Evaluation 09/13/20    Authorization Type [employer]    PT Start Time 0800    PT Stop Time 0844    PT Time Calculation (min) 44 min    Activity Tolerance Patient tolerated treatment well           Past Medical History:  Diagnosis Date  . Allergy   . Anxiety   . Depression   . Gastroparesis   . Heart murmur   . Migraine headache   . Mitral valve prolapse   . SVT (supraventricular tachycardia) (HCC)     No past surgical history on file.  There were no vitals filed for this visit.   Subjective Assessment - 07/21/20 0800    Subjective The needles help.  My wife has done it a few times for her problem.  I didn't have to take the pain medicine.    Pertinent History supraspinatus tendinosis; mild A/C arthritis;  history of LBP;  anxiety,depression, migraines;  previous pt of BF [name], [place])    Patient Stated Goals get back to lifting 50#; be more flexible; be in the most optimal health a 60 year old can be    Currently in Pain? Yes    Pain Score 3     Pain Location Back    Pain Orientation Left    Pain Type Chronic pain                             [facility] Adult PT Treatment/Exercise - 07/21/20 0001      Lumbar Exercises: Standing   Other Standing Lumbar Exercises standing extension at the counter, with towel fixation and at the wall 5-6x each      Lumbar Exercises: Prone   Other Prone Lumbar Exercises press ups 8x; with exhale 5x; manual overpressure 10x    Other Prone Lumbar Exercises press ups with sheet fixed across back 10x       Moist Heat Therapy   Number Minutes Moist Heat 3 Minutes    Moist Heat Location Lumbar Spine   extra layers for heat sensitivity     Manual Therapy   Manual therapy comments trigger point in left mid thoracic superficial paraspinals    Joint Mobilization lumbar PA extension mobs 5x L5-L1; sidelying neutral gapping grade 2 3x 30 sec    Soft tissue mobilization addaday yellow attachment to left lumbar paraspinals    Kinesiotex Inhibit Muscle      Kinesiotix   Inhibit Muscle  I strip left thoracic and lumbar paraspinals            Trigger Point Dry Needling - 07/21/20 0001    Consent Given? Yes    Other Dry Needling left superficial thoracic paraspinal                  PT Short Term Goals - 07/19/20 1751      PT SHORT TERM GOAL #1   Title Pt will be independent with his initial HEP to promote healing and pain reduction  Time 4    Period Weeks    Status New    Target Date 08/16/20      PT SHORT TERM GOAL #2   Title The patient will report a 40% improvement in back pain with sitting, sleeping and bending    Time 4    Period Weeks    Status New      PT SHORT TERM GOAL #3   Title Lumbar flexion improved to 40, extension to 15 degrees needed for personal care, assisting his mother and usual ADLS    Time 4    Period Weeks    Status New      PT SHORT TERM GOAL #4   Title .Marland KitchenMarland Kitchen             PT Long Term Goals - 07/19/20 1753      PT LONG TERM GOAL #1   Title Pt will be independent with his advanced HEP to improve ROM and  strength  with daily activity after d/c from PT.    Time 8    Period Weeks    Status New    Target Date 09/13/20      PT LONG TERM GOAL #2   Title Pt will report  at least 60% improvement in back pain with sitting, sleeping and bending    Time 8    Period Weeks    Status New      PT LONG TERM GOAL #3   Title The patient will have grossly 4+/5 lumbar/pelvic/hip strength  needed for lifting/carrying    Time 8    Period Weeks    Status  New      PT LONG TERM GOAL #4   Title FOTO functional outcome score improved from 42% to 59%    Time 8    Period Weeks    Status New      PT LONG TERM GOAL #5   Title ...      PT LONG TERM GOAL #6   Title ...      PT LONG TERM GOAL #7   Title ...      PT LONG TERM GOAL #8   Title .Marland KitchenMarland Kitchen                 Plan - 07/21/20 2041    Clinical Impression Statement The patient denies LE symptoms this morning which is a sign of possible centralization and a positive response.   Limited lumbar extension noted initially in standing and prone positions but improved following extension joint mobs and overpressure.  Much improved lumbar soft tissue mobility noted however pt has a tender point in lower thoracic paraspinals on left (not treated last visit).  He is receptive to DN in this area followed by neutral gapping.  Encouraged extension movement bias every 2 hours with added overpressure over the next several days.  Therapist closely monitoring response with all interventions.    Comorbidities chronic history spinal pain, anxiety, depression, migraines    Examination-Activity Limitations Lift;Caring for Others;Locomotion Level;Sit;Sleep;Stand    Examination-Participation Restrictions Interpersonal Relationship;Cleaning;Community Activity;Driving    Stability/Clinical Decision Making Stable/Uncomplicated    Rehab Potential Good    PT Frequency 2x / week    PT Duration 8 weeks    PT Treatment/Interventions ADLs/Self Care Home Management;Electrical Stimulation;Cryotherapy;Aquatic Therapy;Moist Heat;Traction;Ultrasound;Neuromuscular re-education;Therapeutic exercise;Therapeutic activities;Patient/family education;Manual techniques;Dry needling;Taping;Spinal Manipulations;Iontophoresis 4mg /ml Dexamethasone    PT Next Visit Plan assess response to DN left thoracic and lumbar paraspinals; asses response to extension bias ex's and  progress as indicated    PT Home Exercise Plan [email or web address]            Patient will benefit from skilled therapeutic intervention in order to improve the following deficits and impairments:  Decreased range of motion,Pain,Impaired perceived functional ability,Decreased strength,Postural dysfunction,Improper body mechanics,Increased muscle spasms  Visit Diagnosis: Chronic left-sided low back pain with left-sided sciatica  Other muscle spasm  Muscle weakness (generalized)     Problem List Patient Active Problem List   Diagnosis Date Noted  . Arthritis of right acromioclavicular joint 02/18/2020  . Right rotator cuff tear 12/18/2019  . Degenerative cervical disc 05/07/2019  . Left lumbar radiculopathy 10/10/2017  . Migraine without aura and without status migrainosus, not intractable 06/22/2017  . Vitamin D deficiency 05/28/2017  . Scrotal pain 05/28/2017  . Paroxysmal SVT (supraventricular tachycardia) ([place]) 05/28/2017  . Mild left inguinal hernia 11/23/2016  . Chronic pelvic pain in male 09/28/2016  . Abdominal pain, left lower quadrant 09/28/2016  . Isolated proteinuria without specific morphologic lesion 06/27/2016  . Left testicular pain 06/27/2016  . Acute left flank pain 06/27/2016  . Epididymitis, left 06/05/2016  . Pelvic floor dysfunction 04/01/2016  . GERD (gastroesophageal reflux disease) 10/13/2015  . Gastroparesis 10/13/2015  . Intractable chronic migraine without aura and without status migrainosus 12/15/2013  . Hyperlipidemia 04/04/2012  . Mitral valve disease 07/15/2009  . IRRITABLE BOWEL SYNDROME 07/15/2009  . Palpitations 06/28/2009   Ruben Im, PT 07/21/20 8:50 PM Phone: [phone] Fax: [phone] Alvera Singh 07/21/2020, 8:49 PM  [facility] [address], [place], [name], [address] Phone: [phone]   Fax:  [phone]  Name: William [name] MRN: [id number] Date of Birth: December 02, 1960

## 2020-07-26 ENCOUNTER — Ambulatory Visit: Payer: PPO | Admitting: Physical Therapy

## 2020-07-26 ENCOUNTER — Other Ambulatory Visit: Payer: Self-pay

## 2020-07-26 DIAGNOSIS — M6281 Muscle weakness (generalized): Secondary | ICD-10-CM

## 2020-07-26 DIAGNOSIS — G8929 Other chronic pain: Secondary | ICD-10-CM

## 2020-07-26 DIAGNOSIS — M5442 Lumbago with sciatica, left side: Secondary | ICD-10-CM | POA: Diagnosis not present

## 2020-07-26 DIAGNOSIS — M62838 Other muscle spasm: Secondary | ICD-10-CM

## 2020-07-26 NOTE — Therapy (Signed)
[facility] [address], [place], [name], [address] Phone: [phone]   Fax:  [phone]  Physical Therapy Treatment  Patient Details  Name: William [name] MRN: [id number] Date of Birth: [birth year] Referring Provider (PT): Dr. [name]   Encounter Date: 07/26/2020   PT End of Session - 07/26/20 1442    Visit Number 3    Date for PT Re-Evaluation 09/13/20    Authorization Type [employer]    PT Start Time 0930    PT Stop Time 1015    PT Time Calculation (min) 45 min    Activity Tolerance Patient tolerated treatment well           Past Medical History:  Diagnosis Date  . Allergy   . Anxiety   . Depression   . Gastroparesis   . Heart murmur   . Migraine headache   . Mitral valve prolapse   . SVT (supraventricular tachycardia) (HCC)     No past surgical history on file.  There were no vitals filed for this visit.   Subjective Assessment - 07/26/20 0929    Subjective I'm doing pretty good.  I think the needles and tape help temporarily.  Pain left inferior scapula.  No LE or Low back symptoms.    Pertinent History supraspinatus tendinosis; mild A/C arthritis;  history of LBP;  anxiety,depression, migraines;  previous pt of BF [name], [place])    Patient Stated Goals get back to lifting 50#; be more flexible; be in the most optimal health a 60 year old can be    Currently in Pain? Yes    Pain Score 3     Pain Location Thoracic    Pain Orientation Left    Pain Type Chronic pain                             [facility] Adult PT Treatment/Exercise - 07/26/20 0001      Self-Care   Self-Care ADL's    ADL's discussed avoidance of static positions; how to reduce future exacerbations/flare ups; importance of ex for resiliency      Neck Exercises: Seated   Other Seated Exercise thoracic extension with ball 10x      Lumbar Exercises: Sidelying   Other Sidelying Lumbar Exercises  open books 10x right/left      Lumbar Exercises: Prone   Other Prone Lumbar Exercises press ups 10x      Lumbar Exercises: Quadruped   [place] 10 reps    Other Quadruped Lumbar Exercises thread the needle left 10x      Manual Therapy   Soft tissue mobilization left persicapular and thoracic paraspinals            Trigger Point Dry Needling - 07/26/20 0001    Consent Given? Yes    Other Dry Needling left superficial thoracic paraspinal                PT Education - 07/26/20 1442    Education Details open books; seated thoracic extension    Person(s) Educated Patient    Methods Explanation;Demonstration;Handout    Comprehension Returned demonstration;Verbalized understanding            PT Short Term Goals - 07/26/20 1447      PT SHORT TERM GOAL #1   Title Pt will be independent with his initial HEP to promote healing and pain reduction    Status Achieved  PT SHORT TERM GOAL #2   Title The patient will report a 40% improvement in back pain with sitting, sleeping and bending    Time 4    Period Weeks    Status On-going      PT SHORT TERM GOAL #3   Title Lumbar flexion improved to 40, extension to 15 degrees needed for personal care, assisting his mother and usual ADLS    Time 4    Period Weeks    Status On-going             PT Long Term Goals - 07/19/20 1753      PT LONG TERM GOAL #1   Title Pt will be independent with his advanced HEP to improve ROM and  strength  with daily activity after d/c from PT.    Time 8    Period Weeks    Status New    Target Date 09/13/20      PT LONG TERM GOAL #2   Title Pt will report  at least 60% improvement in back pain with sitting, sleeping and bending    Time 8    Period Weeks    Status New      PT LONG TERM GOAL #3   Title The patient will have grossly 4+/5 lumbar/pelvic/hip strength  needed for lifting/carrying    Time 8    Period Weeks    Status New      PT LONG TERM GOAL #4   Title FOTO  functional outcome score improved from 42% to 59%    Time 8    Period Weeks    Status New      PT LONG TERM GOAL #5   Title ...      PT LONG TERM GOAL #6   Title ...      PT LONG TERM GOAL #7   Title ...      PT LONG TERM GOAL #8   Title .Marland KitchenMarland Kitchen                 Plan - 07/26/20 1013    Clinical Impression Statement The patient reports no lumbar or LE symptoms.  Pain only in left mid thoracic and inferior scapular region.  Good response to initiation of thoracic extension and rotation ROM and improved soft tissue mobility overall in this region.  Therapist monitoring response with all interventions.    Personal Factors and Comorbidities Comorbidity 1;Comorbidity 2;Comorbidity 3+;Past/Current Experience    Comorbidities chronic history spinal pain, anxiety, depression, migraines    Examination-Activity Limitations Lift;Caring for Others;Locomotion Level;Sit;Sleep;Stand    Stability/Clinical Decision Making Stable/Uncomplicated    Rehab Potential Good    PT Frequency 2x / week    PT Duration 8 weeks    PT Treatment/Interventions ADLs/Self Care Home Management;Electrical Stimulation;Cryotherapy;Aquatic Therapy;Moist Heat;Traction;Ultrasound;Neuromuscular re-education;Therapeutic exercise;Therapeutic activities;Patient/family education;Manual techniques;Dry needling;Taping;Spinal Manipulations;Iontophoresis 4mg /ml Dexamethasone    PT Next Visit Plan assess response to DN left thoracic, scapular and lumbar paraspinals; thoracic extension; open books;  recheck spinal ROM    PT Home Exercise Plan [email or web address]           Patient will benefit from skilled therapeutic intervention in order to improve the following deficits and impairments:  Decreased range of motion,Pain,Impaired perceived functional ability,Decreased strength,Postural dysfunction,Improper body mechanics,Increased muscle spasms  Visit Diagnosis: Chronic left-sided low back pain with left-sided sciatica  Other muscle  spasm  Muscle weakness (generalized)     Problem List Patient Active Problem List   Diagnosis Date Noted  .  Arthritis of right acromioclavicular joint 02/18/2020  . Right rotator cuff tear 12/18/2019  . Degenerative cervical disc 05/07/2019  . Left lumbar radiculopathy 10/10/2017  . Migraine without aura and without status migrainosus, not intractable 06/22/2017  . Vitamin D deficiency 05/28/2017  . Scrotal pain 05/28/2017  . Paroxysmal SVT (supraventricular tachycardia) ([place]) 05/28/2017  . Mild left inguinal hernia 11/23/2016  . Chronic pelvic pain in male 09/28/2016  . Abdominal pain, left lower quadrant 09/28/2016  . Isolated proteinuria without specific morphologic lesion 06/27/2016  . Left testicular pain 06/27/2016  . Acute left flank pain 06/27/2016  . Epididymitis, left 06/05/2016  . Pelvic floor dysfunction 04/01/2016  . GERD (gastroesophageal reflux disease) 10/13/2015  . Gastroparesis 10/13/2015  . Intractable chronic migraine without aura and without status migrainosus 12/15/2013  . Hyperlipidemia 04/04/2012  . Mitral valve disease 07/15/2009  . IRRITABLE BOWEL SYNDROME 07/15/2009  . Palpitations 06/28/2009   Ruben Im, PT 07/26/20 2:49 PM Phone: [phone] Fax: [phone] Alvera Singh 07/26/2020, 2:49 PM  [facility] [address] [place], [name], [address] Phone: [phone]   Fax:  [phone]  Name: Dartanian Knaggs MRN: [id number] Date of Birth: 09-30-1960

## 2020-07-26 NOTE — Patient Instructions (Signed)
Access Code: [email or web address] URL: [email or web address] Date: 07/26/2020 Prepared by: Ruben Im  Exercises Standing Bilateral Low Shoulder Row with Anchored Resistance - 2 x daily - 7 x weekly - 2 sets - 10 reps Single Arm Shoulder Extension with Anchored Resistance - 2 x daily - 7 x weekly - 2 sets - 10 reps Standing Diagonal Shoulder Extension with Anchored Resistance - 1 x daily - 7 x weekly - 1 sets - 10 reps Standing Isometric Shoulder Flexion with Doorway - Arm Bent - 1 x daily - 7 x weekly - 2 sets - 10 reps Isometric Shoulder Extension at Wall - 1 x daily - 7 x weekly - 1 sets - 5 reps - 5 hold Isometric Shoulder Abduction at Wall - 1 x daily - 7 x weekly - 2 sets - 10 reps Standing Isometric Shoulder Internal Rotation with Towel Roll at Doorway - 1 x daily - 7 x weekly - 1 sets - 5 reps - 5 hold Standing Isometric Shoulder External Rotation with Doorway - 1 x daily - 7 x weekly - 1 sets - 5 reps - 5 hold Doorway Pec Stretch at 90 Degrees Abduction - 1 x daily - 7 x weekly - 3 reps - 1 sets - 30-60 seconds hold Prone Shoulder Extension - Single Arm - 1 x daily - 7 x weekly - 1 sets - 10 reps Standing Shoulder Horizontal Abduction with Resistance - 1 x daily - 7 x weekly - 3 sets - 10 reps Standing Shoulder Internal Rotation with Anchored Resistance - 1 x daily - 7 x weekly - 2 sets - 10 reps Shoulder External Rotation with Anchored Resistance - 1 x daily - 7 x weekly - 2 sets - 10 reps Single Arm Bent Over Shoulder Horizontal Abduction with Dumbbell - Palm Down - 1 x daily - 7 x weekly - 2 sets - 10 reps Single Arm Bent Over Shoulder Scaption with Dumbbell - 1 x daily - 7 x weekly - 2 sets - 10 reps Supine Scapular Protraction in Flexion with Dumbbells - 1 x daily - 7 x weekly - 2 sets - 10 reps Supine Shoulder Flexion Extension AAROM with Dowel - 1 x daily - 7 x weekly - 3 sets - 10 reps Supine Static Chest Stretch on Foam Roll - 1 x daily - 7 x weekly - 3 sets - 10  reps Sidelying Open Book Thoracic Lumbar Rotation and Extension - 1 x daily - 7 x weekly - 1 sets - 10 reps Seated Thoracic Lumbar Extension with Pectoralis Stretch - 1 x daily - 7 x weekly - 1 sets - 10 reps  Patient Education [place] Patient Instructions

## 2020-07-29 ENCOUNTER — Ambulatory Visit: Payer: PPO | Attending: Family Medicine | Admitting: Physical Therapy

## 2020-07-29 ENCOUNTER — Other Ambulatory Visit: Payer: Self-pay

## 2020-07-29 DIAGNOSIS — M5442 Lumbago with sciatica, left side: Secondary | ICD-10-CM | POA: Insufficient documentation

## 2020-07-29 DIAGNOSIS — M6281 Muscle weakness (generalized): Secondary | ICD-10-CM | POA: Insufficient documentation

## 2020-07-29 DIAGNOSIS — M25552 Pain in left hip: Secondary | ICD-10-CM | POA: Insufficient documentation

## 2020-07-29 DIAGNOSIS — M25511 Pain in right shoulder: Secondary | ICD-10-CM | POA: Insufficient documentation

## 2020-07-29 DIAGNOSIS — R293 Abnormal posture: Secondary | ICD-10-CM | POA: Diagnosis not present

## 2020-07-29 DIAGNOSIS — G8929 Other chronic pain: Secondary | ICD-10-CM | POA: Diagnosis not present

## 2020-07-29 DIAGNOSIS — M62838 Other muscle spasm: Secondary | ICD-10-CM | POA: Diagnosis not present

## 2020-07-29 NOTE — Therapy (Signed)
[facility] [address] [place], [name], [address] Phone: [phone]   Fax:  [phone]  Physical Therapy Treatment  Patient Details  Name: William [name] MRN: [id number] Date of Birth: 1960/07/20 Referring Provider (PT): Dr. [name]   Encounter Date: 07/29/2020   PT End of Session - 07/29/20 1000    Visit Number 4    Date for PT Re-Evaluation 09/13/20    Authorization Type [employer]    PT Start Time 0936    PT Stop Time 1015    PT Time Calculation (min) 39 min    Activity Tolerance Patient tolerated treatment well           Past Medical History:  Diagnosis Date  . Allergy   . Anxiety   . Depression   . Gastroparesis   . Heart murmur   . Migraine headache   . Mitral valve prolapse   . SVT (supraventricular tachycardia) (HCC)     No past surgical history on file.  There were no vitals filed for this visit.   Subjective Assessment - 07/29/20 0934    Subjective I'm doing pretty well.  I'm dealing with allergies.  No issues with back.  No pain.  I'm working out the stiffness.  Only sore for an hour after DN.    Pertinent History supraspinatus tendinosis; mild A/C arthritis;  history of LBP;  anxiety,depression, migraines;  previous pt of BF [name], [place])    Currently in Pain? No/denies    Pain Score 0-No pain    Pain Location Back    Pain Orientation Left                             [facility] Adult PT Treatment/Exercise - 07/29/20 0001      Neck Exercises: Seated   Other Seated Exercise cervical SNAG with towel for assisted rotation 10x right/left      Lumbar Exercises: Stretches   Other Lumbar Stretch Exercise childs pose with foam roll with arm lift 5x      Lumbar Exercises: Aerobic   UBE (Upper Arm Bike) 1.5 forward/1.5 backward      Lumbar Exercises: Standing   Other Standing Lumbar Exercises green band open hand lat pull downs 15x    Other Standing Lumbar  Exercises upright row green band with thoracic rotation 10x right/left      Lumbar Exercises: Supine   Other Supine Lumbar Exercises thoracic extension over pool noodle 3 levels 4x each    Other Supine Lumbar Exercises red band scapular series: overhead, horizontal abduction, sash, external rotation 10x each      Lumbar Exercises: Sidelying   Other Sidelying Lumbar Exercises open books red band  10x right/left      Shoulder Exercises: Supine   Other Supine Exercises UE elevation with lat activation pull downs 10x                  PT Education - 07/29/20 1018    Education Details cervical SNAG rotation with towel assist    Person(s) Educated Patient    Methods Explanation;Demonstration;Handout    Comprehension Verbalized understanding;Returned demonstration            PT Short Term Goals - 07/26/20 1447      PT SHORT TERM GOAL #1   Title Pt will be independent with his initial HEP to promote healing and pain reduction    Status Achieved  PT SHORT TERM GOAL #2   Title The patient will report a 40% improvement in back pain with sitting, sleeping and bending    Time 4    Period Weeks    Status On-going      PT SHORT TERM GOAL #3   Title Lumbar flexion improved to 40, extension to 15 degrees needed for personal care, assisting his mother and usual ADLS    Time 4    Period Weeks    Status On-going             PT Long Term Goals - 07/19/20 1753      PT LONG TERM GOAL #1   Title Pt will be independent with his advanced HEP to improve ROM and  strength  with daily activity after d/c from PT.    Time 8    Period Weeks    Status New    Target Date 09/13/20      PT LONG TERM GOAL #2   Title Pt will report  at least 60% improvement in back pain with sitting, sleeping and bending    Time 8    Period Weeks    Status New      PT LONG TERM GOAL #3   Title The patient will have grossly 4+/5 lumbar/pelvic/hip strength  needed for lifting/carrying    Time 8     Period Weeks    Status New      PT LONG TERM GOAL #4   Title FOTO functional outcome score improved from 42% to 59%    Time 8    Period Weeks    Status New      PT LONG TERM GOAL #5   Title ...      PT LONG TERM GOAL #6   Title ...      PT LONG TERM GOAL #7   Title ...      PT LONG TERM GOAL #8   Title .Marland KitchenMarland Kitchen                 Plan - 07/29/20 1014    Clinical Impression Statement No complaint of pain today just general spinal stiffness.  He is able to progress with intensity of mobility ex's and core strengthening in multiple positions.  He denies pain just muscular fatigue at a level of 7 out of 20 perceived exertion level.  He is progressing very well with symptom reduction and increased activity level.    Comorbidities chronic history spinal pain, anxiety, depression, migraines    Examination-Participation Restrictions Interpersonal Relationship;Cleaning;Community Activity;Driving    Rehab Potential Good    PT Frequency 2x / week    PT Duration 8 weeks    PT Treatment/Interventions ADLs/Self Care Home Management;Electrical Stimulation;Cryotherapy;Aquatic Therapy;Moist Heat;Traction;Ultrasound;Neuromuscular re-education;Therapeutic exercise;Therapeutic activities;Patient/family education;Manual techniques;Dry needling;Taping;Spinal Manipulations;Iontophoresis 4mg /ml Dexamethasone    PT Next Visit Plan core and scapular muscle strengthening;  thoracic extension; open books;  recheck spinal ROM    PT Home Exercise Plan [email or web address]           Patient will benefit from skilled therapeutic intervention in order to improve the following deficits and impairments:  Decreased range of motion,Pain,Impaired perceived functional ability,Decreased strength,Postural dysfunction,Improper body mechanics,Increased muscle spasms  Visit Diagnosis: Chronic left-sided low back pain with left-sided sciatica  Other muscle spasm  Muscle weakness (generalized)     Problem List Patient  Active Problem List   Diagnosis Date Noted  . Arthritis of right acromioclavicular joint 02/18/2020  . Right rotator cuff tear  12/18/2019  . Degenerative cervical disc 05/07/2019  . Left lumbar radiculopathy 10/10/2017  . Migraine without aura and without status migrainosus, not intractable 06/22/2017  . Vitamin D deficiency 05/28/2017  . Scrotal pain 05/28/2017  . Paroxysmal SVT (supraventricular tachycardia) ([place]) 05/28/2017  . Mild left inguinal hernia 11/23/2016  . Chronic pelvic pain in male 09/28/2016  . Abdominal pain, left lower quadrant 09/28/2016  . Isolated proteinuria without specific morphologic lesion 06/27/2016  . Left testicular pain 06/27/2016  . Acute left flank pain 06/27/2016  . Epididymitis, left 06/05/2016  . Pelvic floor dysfunction 04/01/2016  . GERD (gastroesophageal reflux disease) 10/13/2015  . Gastroparesis 10/13/2015  . Intractable chronic migraine without aura and without status migrainosus 12/15/2013  . Hyperlipidemia 04/04/2012  . Mitral valve disease 07/15/2009  . IRRITABLE BOWEL SYNDROME 07/15/2009  . Palpitations 06/28/2009   Ruben Im, PT 07/29/20 12:21 PM Phone: [phone] Fax: [phone] Alvera Singh 07/29/2020, 12:21 PM  [facility] [address] [place], [name], [address] Phone: [phone]   Fax:  [phone]  Name: William [name] MRN: [id number] Date of Birth: May 15, 1960

## 2020-07-29 NOTE — Patient Instructions (Signed)
Access Code: [email or web address] URL: [email or web address] Date: 07/29/2020 Prepared by: Ruben Im  Exercises Standing Bilateral Low Shoulder Row with Anchored Resistance - 2 x daily - 7 x weekly - 2 sets - 10 reps Single Arm Shoulder Extension with Anchored Resistance - 2 x daily - 7 x weekly - 2 sets - 10 reps Standing Diagonal Shoulder Extension with Anchored Resistance - 1 x daily - 7 x weekly - 1 sets - 10 reps Standing Isometric Shoulder Flexion with Doorway - Arm Bent - 1 x daily - 7 x weekly - 2 sets - 10 reps Isometric Shoulder Extension at Wall - 1 x daily - 7 x weekly - 1 sets - 5 reps - 5 hold Isometric Shoulder Abduction at Wall - 1 x daily - 7 x weekly - 2 sets - 10 reps Standing Isometric Shoulder Internal Rotation with Towel Roll at Doorway - 1 x daily - 7 x weekly - 1 sets - 5 reps - 5 hold Standing Isometric Shoulder External Rotation with Doorway - 1 x daily - 7 x weekly - 1 sets - 5 reps - 5 hold Doorway Pec Stretch at 90 Degrees Abduction - 1 x daily - 7 x weekly - 3 reps - 1 sets - 30-60 seconds hold Prone Shoulder Extension - Single Arm - 1 x daily - 7 x weekly - 1 sets - 10 reps Standing Shoulder Horizontal Abduction with Resistance - 1 x daily - 7 x weekly - 3 sets - 10 reps Standing Shoulder Internal Rotation with Anchored Resistance - 1 x daily - 7 x weekly - 2 sets - 10 reps Shoulder External Rotation with Anchored Resistance - 1 x daily - 7 x weekly - 2 sets - 10 reps Single Arm Bent Over Shoulder Horizontal Abduction with Dumbbell - Palm Down - 1 x daily - 7 x weekly - 2 sets - 10 reps Single Arm Bent Over Shoulder Scaption with Dumbbell - 1 x daily - 7 x weekly - 2 sets - 10 reps Supine Scapular Protraction in Flexion with Dumbbells - 1 x daily - 7 x weekly - 2 sets - 10 reps Supine Shoulder Flexion Extension AAROM with Dowel - 1 x daily - 7 x weekly - 3 sets - 10 reps Supine Static Chest Stretch on Foam Roll - 1 x daily - 7 x weekly - 3 sets - 10  reps Sidelying Open Book Thoracic Lumbar Rotation and Extension - 1 x daily - 7 x weekly - 1 sets - 10 reps Seated Thoracic Lumbar Extension with Pectoralis Stretch - 1 x daily - 7 x weekly - 1 sets - 10 reps Seated Assisted Cervical Rotation with Towel - 1 x daily - 7 x weekly - 1 sets - 10 reps  Patient Education [place] Patient Instructions

## 2020-08-03 ENCOUNTER — Ambulatory Visit: Payer: PPO | Attending: Critical Care Medicine

## 2020-08-03 ENCOUNTER — Encounter: Payer: PPO | Admitting: Physical Therapy

## 2020-08-03 DIAGNOSIS — Z20822 Contact with and (suspected) exposure to covid-19: Secondary | ICD-10-CM

## 2020-08-04 LAB — SARS-COV-2, NAA 2 DAY TAT

## 2020-08-04 LAB — NOVEL CORONAVIRUS, NAA: SARS-CoV-2, NAA: NOT DETECTED

## 2020-08-05 ENCOUNTER — Other Ambulatory Visit: Payer: Self-pay

## 2020-08-05 ENCOUNTER — Encounter: Payer: Self-pay | Admitting: Physical Therapy

## 2020-08-05 ENCOUNTER — Ambulatory Visit: Payer: PPO | Admitting: Physical Therapy

## 2020-08-05 DIAGNOSIS — M6281 Muscle weakness (generalized): Secondary | ICD-10-CM

## 2020-08-05 DIAGNOSIS — M62838 Other muscle spasm: Secondary | ICD-10-CM

## 2020-08-05 DIAGNOSIS — M25552 Pain in left hip: Secondary | ICD-10-CM

## 2020-08-05 DIAGNOSIS — G8929 Other chronic pain: Secondary | ICD-10-CM

## 2020-08-05 DIAGNOSIS — M5442 Lumbago with sciatica, left side: Secondary | ICD-10-CM | POA: Diagnosis not present

## 2020-08-05 DIAGNOSIS — M25511 Pain in right shoulder: Secondary | ICD-10-CM

## 2020-08-05 DIAGNOSIS — R293 Abnormal posture: Secondary | ICD-10-CM

## 2020-08-05 NOTE — Therapy (Signed)
[facility] [address], [place], [name], [address] Phone: [phone]   Fax:  [phone]  Physical Therapy Treatment  Patient Details  Name: William [name] MRN: [id number] Date of Birth: 08/05/1960 Referring Provider (PT): Dr. [name]   Encounter Date: 08/05/2020   PT End of Session - 08/05/20 0934    Visit Number 5    Date for PT Re-Evaluation 09/13/20    Authorization Type [employer]    PT Start Time 0931    PT Stop Time 1010    PT Time Calculation (min) 39 min    Activity Tolerance Patient tolerated treatment well    Behavior During Therapy [facility] for tasks assessed/performed           Past Medical History:  Diagnosis Date  . Allergy   . Anxiety   . Depression   . Gastroparesis   . Heart murmur   . Migraine headache   . Mitral valve prolapse   . SVT (supraventricular tachycardia) ([place])     History reviewed. No pertinent surgical history.  There were no vitals filed for this visit.   Subjective Assessment - 08/05/20 0935    Subjective My headaches are back and I'm dealing with that ( not too bad this AM) My back is doing "ok" this AM.    Pertinent History supraspinatus tendinosis; mild A/C arthritis;  history of LBP;  anxiety,depression, migraines;  previous pt of BF [name], [place])    Currently in Pain? No/denies   Slight headache 2/10                            [facility] Adult PT Treatment/Exercise - 08/05/20 0001      Lumbar Exercises: Aerobic   UBE (Upper Arm Bike) 1.5 forward/1.5 backward    Nustep L2 x 5 min to end session      Shoulder Exercises: Supine   Other Supine Exercises Cervical & lumbar release series on foam roll                    PT Short Term Goals - 07/26/20 1447      PT SHORT TERM GOAL #1   Title Pt will be independent with his initial HEP to promote healing and pain reduction    Status Achieved      PT SHORT TERM GOAL #2    Title The patient will report a 40% improvement in back pain with sitting, sleeping and bending    Time 4    Period Weeks    Status On-going      PT SHORT TERM GOAL #3   Title Lumbar flexion improved to 40, extension to 15 degrees needed for personal care, assisting his mother and usual ADLS    Time 4    Period Weeks    Status On-going             PT Long Term Goals - 07/19/20 1753      PT LONG TERM GOAL #1   Title Pt will be independent with his advanced HEP to improve ROM and  strength  with daily activity after d/c from PT.    Time 8    Period Weeks    Status New    Target Date 09/13/20      PT LONG TERM GOAL #2   Title Pt will report  at least 60% improvement in back pain with sitting, sleeping and  bending    Time 8    Period Weeks    Status New      PT LONG TERM GOAL #3   Title The patient will have grossly 4+/5 lumbar/pelvic/hip strength  needed for lifting/carrying    Time 8    Period Weeks    Status New      PT LONG TERM GOAL #4   Title FOTO functional outcome score improved from 42% to 59%    Time 8    Period Weeks    Status New      PT LONG TERM GOAL #5   Title ...      PT LONG TERM GOAL #6   Title ...      PT LONG TERM GOAL #7   Title ...      PT LONG TERM GOAL #8   Title .Marland KitchenMarland Kitchen                 Plan - 08/05/20 0945    Clinical Impression Statement Pt arrives with mild headache but no back pain. Stiffness is better this morning. Pt reports he purchased a foam roller for home and wanted to learn some things to do at home for box his neck and back. Pt did well with MELT Method series for cervical and lumbar release and hydration. Post session comments " i feel more fluid."    Personal Factors and Comorbidities Comorbidity 1;Comorbidity 2;Comorbidity 3+;Past/Current Experience    Comorbidities chronic history spinal pain, anxiety, depression, migraines    Examination-Activity Limitations Lift;Caring for Others;Locomotion Level;Sit;Sleep;Stand     Examination-Participation Restrictions Interpersonal Relationship;Cleaning;Community Activity;Driving    Stability/Clinical Decision Making Stable/Uncomplicated    Rehab Potential Good    PT Frequency 2x / week    PT Duration 8 weeks    PT Treatment/Interventions ADLs/Self Care Home Management;Electrical Stimulation;Cryotherapy;Aquatic Therapy;Moist Heat;Traction;Ultrasound;Neuromuscular re-education;Therapeutic exercise;Therapeutic activities;Patient/family education;Manual techniques;Dry needling;Taping;Spinal Manipulations;Iontophoresis 4mg /ml Dexamethasone    PT Next Visit Plan Work with foam roller again for home use.    PT Home Exercise Plan [email or web address]    Consulted and Agree with Plan of Care Patient           Patient will benefit from skilled therapeutic intervention in order to improve the following deficits and impairments:  Decreased range of motion,Pain,Impaired perceived functional ability,Decreased strength,Postural dysfunction,Improper body mechanics,Increased muscle spasms  Visit Diagnosis: Chronic left-sided low back pain with left-sided sciatica  Other muscle spasm  Muscle weakness (generalized)  Right shoulder pain, unspecified chronicity  Pain in left hip  Abnormal posture     Problem List Patient Active Problem List   Diagnosis Date Noted  . Arthritis of right acromioclavicular joint 02/18/2020  . Right rotator cuff tear 12/18/2019  . Degenerative cervical disc 05/07/2019  . Left lumbar radiculopathy 10/10/2017  . Migraine without aura and without status migrainosus, not intractable 06/22/2017  . Vitamin D deficiency 05/28/2017  . Scrotal pain 05/28/2017  . Paroxysmal SVT (supraventricular tachycardia) ([place]) 05/28/2017  . Mild left inguinal hernia 11/23/2016  . Chronic pelvic pain in male 09/28/2016  . Abdominal pain, left lower quadrant 09/28/2016  . Isolated proteinuria without specific morphologic lesion 06/27/2016  . Left testicular pain  06/27/2016  . Acute left flank pain 06/27/2016  . Epididymitis, left 06/05/2016  . Pelvic floor dysfunction 04/01/2016  . GERD (gastroesophageal reflux disease) 10/13/2015  . Gastroparesis 10/13/2015  . Intractable chronic migraine without aura and without status migrainosus 12/15/2013  . Hyperlipidemia 04/04/2012  . Mitral valve disease 07/15/2009  . IRRITABLE BOWEL  SYNDROME 07/15/2009  . Palpitations 06/28/2009    Cynde Menard, PTA 08/05/2020, 10:14 AM  [facility] [address], [place], [name], [address] Phone: [phone]   Fax:  [phone]  Name: William [name] MRN: [id number] Date of Birth: 06-14-1960

## 2020-08-08 ENCOUNTER — Encounter: Payer: Self-pay | Admitting: Physical Therapy

## 2020-08-08 ENCOUNTER — Other Ambulatory Visit: Payer: Self-pay

## 2020-08-08 ENCOUNTER — Ambulatory Visit: Payer: PPO | Admitting: Physical Therapy

## 2020-08-08 DIAGNOSIS — M25552 Pain in left hip: Secondary | ICD-10-CM

## 2020-08-08 DIAGNOSIS — R293 Abnormal posture: Secondary | ICD-10-CM

## 2020-08-08 DIAGNOSIS — M62838 Other muscle spasm: Secondary | ICD-10-CM

## 2020-08-08 DIAGNOSIS — M5442 Lumbago with sciatica, left side: Secondary | ICD-10-CM | POA: Diagnosis not present

## 2020-08-08 DIAGNOSIS — M25511 Pain in right shoulder: Secondary | ICD-10-CM

## 2020-08-08 DIAGNOSIS — M6281 Muscle weakness (generalized): Secondary | ICD-10-CM

## 2020-08-08 DIAGNOSIS — G8929 Other chronic pain: Secondary | ICD-10-CM

## 2020-08-08 NOTE — Therapy (Signed)
[facility] [address], [place], [name], [address] Phone: [phone]   Fax:  [phone]  Physical Therapy Treatment  Patient Details  Name: Dontrell Stuck MRN: [id number] Date of Birth: 09-29-1960 Referring Provider (PT): Dr. [name]   Encounter Date: 08/08/2020   PT End of Session - 08/08/20 0939    Visit Number 6    Date for PT Re-Evaluation 09/13/20    Authorization Type [employer]    PT Start Time 0930    PT Stop Time 1016    PT Time Calculation (min) 46 min    Activity Tolerance Patient tolerated treatment well    Behavior During Therapy [facility] for tasks assessed/performed           Past Medical History:  Diagnosis Date  . Allergy   . Anxiety   . Depression   . Gastroparesis   . Heart murmur   . Migraine headache   . Mitral valve prolapse   . SVT (supraventricular tachycardia) ([place])     History reviewed. No pertinent surgical history.  There were no vitals filed for this visit.   Subjective Assessment - 08/08/20 0941    Subjective I like the foam roll roll exercises but we need to go over them again.    Pertinent History supraspinatus tendinosis; mild A/C arthritis;  history of LBP;  anxiety,depression, migraines;  previous pt of BF [name], [place])    Currently in Pain? No/denies                             [facility] Adult PT Treatment/Exercise - 08/08/20 0001      Neck Exercises: Seated   Other Seated Exercise Review & performance of self SNAGS 10x bil   pt required re-instruction     Lumbar Exercises: Aerobic   Nustep L2 x 6 min to end session      Shoulder Exercises: Supine   Other Supine Exercises Cervical & lumbar release series on foam roll   Review of series per pt request     Shoulder Exercises: Standing   Other Standing Exercises Wall pushups 10x, added to HEP                  PT Education - 08/08/20 0954    Education Details HEP     Person(s) Educated Patient    Methods Explanation;Demonstration;Tactile cues;Verbal cues;Handout    Comprehension Returned demonstration;Verbalized understanding            PT Short Term Goals - 07/26/20 1447      PT SHORT TERM GOAL #1   Title Pt will be independent with his initial HEP to promote healing and pain reduction    Status Achieved      PT SHORT TERM GOAL #2   Title The patient will report a 40% improvement in back pain with sitting, sleeping and bending    Time 4    Period Weeks    Status On-going      PT SHORT TERM GOAL #3   Title Lumbar flexion improved to 40, extension to 15 degrees needed for personal care, assisting his mother and usual ADLS    Time 4    Period Weeks    Status On-going             PT Long Term Goals - 07/19/20 1753      PT LONG TERM GOAL #1   Title Pt  will be independent with his advanced HEP to improve ROM and  strength  with daily activity after d/c from PT.    Time 8    Period Weeks    Status New    Target Date 09/13/20      PT LONG TERM GOAL #2   Title Pt will report  at least 60% improvement in back pain with sitting, sleeping and bending    Time 8    Period Weeks    Status New      PT LONG TERM GOAL #3   Title The patient will have grossly 4+/5 lumbar/pelvic/hip strength  needed for lifting/carrying    Time 8    Period Weeks    Status New      PT LONG TERM GOAL #4   Title FOTO functional outcome score improved from 42% to 59%    Time 8    Period Weeks    Status New      PT LONG TERM GOAL #5   Title ...      PT LONG TERM GOAL #6   Title ...      PT LONG TERM GOAL #7   Title ...      PT LONG TERM GOAL #8   Title .Marland KitchenMarland Kitchen                 Plan - 08/08/20 0940    Clinical Impression Statement Pt arrives painfree today, some stiffness in his neck. Pt requests review of cervical SNAGS and soft roll routine for both cervical and lumbar hydration & release. Visuals were included today post re-instruction.     Personal Factors and Comorbidities Comorbidity 1;Comorbidity 2;Comorbidity 3+;Past/Current Experience    Comorbidities chronic history spinal pain, anxiety, depression, migraines    Examination-Activity Limitations Lift;Caring for Others;Locomotion Level;Sit;Sleep;Stand    Examination-Participation Restrictions Interpersonal Relationship;Cleaning;Community Activity;Driving    Stability/Clinical Decision Making Stable/Uncomplicated    Rehab Potential Good    PT Frequency 2x / week    PT Duration 8 weeks    PT Treatment/Interventions ADLs/Self Care Home Management;Electrical Stimulation;Cryotherapy;Aquatic Therapy;Moist Heat;Traction;Ultrasound;Neuromuscular re-education;Therapeutic exercise;Therapeutic activities;Patient/family education;Manual techniques;Dry needling;Taping;Spinal Manipulations;Iontophoresis 4mg /ml Dexamethasone    PT Next Visit Plan See how pt is doing at home with new exercises.    PT Home Exercise Plan [email or web address]    Consulted and Agree with Plan of Care Patient           Patient will benefit from skilled therapeutic intervention in order to improve the following deficits and impairments:  Decreased range of motion,Pain,Impaired perceived functional ability,Decreased strength,Postural dysfunction,Improper body mechanics,Increased muscle spasms  Visit Diagnosis: Chronic left-sided low back pain with left-sided sciatica  Other muscle spasm  Muscle weakness (generalized)  Right shoulder pain, unspecified chronicity  Pain in left hip  Abnormal posture     Problem List Patient Active Problem List   Diagnosis Date Noted  . Arthritis of right acromioclavicular joint 02/18/2020  . Right rotator cuff tear 12/18/2019  . Degenerative cervical disc 05/07/2019  . Left lumbar radiculopathy 10/10/2017  . Migraine without aura and without status migrainosus, not intractable 06/22/2017  . Vitamin D deficiency 05/28/2017  . Scrotal pain 05/28/2017  . Paroxysmal SVT  (supraventricular tachycardia) ([place]) 05/28/2017  . Mild left inguinal hernia 11/23/2016  . Chronic pelvic pain in male 09/28/2016  . Abdominal pain, left lower quadrant 09/28/2016  . Isolated proteinuria without specific morphologic lesion 06/27/2016  . Left testicular pain 06/27/2016  . Acute left flank pain 06/27/2016  . Epididymitis, left  06/05/2016  . Pelvic floor dysfunction 04/01/2016  . GERD (gastroesophageal reflux disease) 10/13/2015  . Gastroparesis 10/13/2015  . Intractable chronic migraine without aura and without status migrainosus 12/15/2013  . Hyperlipidemia 04/04/2012  . Mitral valve disease 07/15/2009  . IRRITABLE BOWEL SYNDROME 07/15/2009  . Palpitations 06/28/2009    Vaeda Westall, PTA 08/08/2020, 10:10 AM  [facility] [address] [place], [name], [address] Phone: [phone]   Fax:  [phone]  Name: Keyvin Rison MRN: [id number] Date of Birth: 1961/03/29

## 2020-08-10 ENCOUNTER — Other Ambulatory Visit: Payer: Self-pay

## 2020-08-10 ENCOUNTER — Ambulatory Visit: Payer: PPO | Admitting: Physical Therapy

## 2020-08-10 ENCOUNTER — Encounter: Payer: Self-pay | Admitting: Physical Therapy

## 2020-08-10 DIAGNOSIS — M5442 Lumbago with sciatica, left side: Secondary | ICD-10-CM | POA: Diagnosis not present

## 2020-08-10 DIAGNOSIS — M6281 Muscle weakness (generalized): Secondary | ICD-10-CM

## 2020-08-10 DIAGNOSIS — G8929 Other chronic pain: Secondary | ICD-10-CM

## 2020-08-10 DIAGNOSIS — M62838 Other muscle spasm: Secondary | ICD-10-CM

## 2020-08-10 NOTE — Therapy (Signed)
[facility] [address] [place], [name], [address] Phone: [phone]   Fax:  [phone]  Physical Therapy Treatment  Patient Details  Name: William [name] MRN: [id number] Date of Birth: May 13, 1960 Referring Provider (PT): Dr. [name]   Encounter Date: 08/10/2020   PT End of Session - 08/10/20 0849    Visit Number 7    Date for PT Re-Evaluation 09/13/20    Authorization Type [employer]    PT Start Time 0847    PT Stop Time 0925    PT Time Calculation (min) 38 min    Activity Tolerance Patient tolerated treatment well    Behavior During Therapy [facility] for tasks assessed/performed           Past Medical History:  Diagnosis Date  . Allergy   . Anxiety   . Depression   . Gastroparesis   . Heart murmur   . Migraine headache   . Mitral valve prolapse   . SVT (supraventricular tachycardia) ([place])     History reviewed. No pertinent surgical history.  There were no vitals filed for this visit.   Subjective Assessment - 08/10/20 0850    Subjective I was moving a lot of stuff around yesterday and I think I hurt my back. I did too much and should have taken a break.    Pertinent History supraspinatus tendinosis; mild A/C arthritis;  history of LBP;  anxiety,depression, migraines;  previous pt of BF [name], [place])    Currently in Pain? Yes    Pain Score 6     Pain Location Back    Pain Orientation Left    Pain Descriptors / Indicators Sore    Aggravating Factors  too much bending    Pain Relieving Factors rest, meds    Multiple Pain Sites No                             [facility] Adult PT Treatment/Exercise - 08/10/20 0001      Therapeutic Activites    Therapeutic Activities Lifting    Lifting 5# KB off 8" box 2x6, PTA demo & educated pt in  hip hinge      Lumbar Exercises: Stretches   Single Knee to Chest Stretch Left;3 reps;10 seconds    Double Knee to Chest Stretch --    with rocking side to side 20x   Lower Trunk Rotation 3 reps;10 seconds   LT   Other Lumbar Stretch Exercise childs pose and CP with side bend      Lumbar Exercises: Aerobic   Recumbent Bike L1 x 5 min with MHP to lumbar, VC to maintain 40 RPMs or >      Lumbar Exercises: Supine   Other Supine Lumbar Exercises Core stabs on foam roll: breathing with TA, marching, rocking, scissor arms 6x for all Bil                    PT Short Term Goals - 07/26/20 1447      PT SHORT TERM GOAL #1   Title Pt will be independent with his initial HEP to promote healing and pain reduction    Status Achieved      PT SHORT TERM GOAL #2   Title The patient will report a 40% improvement in back pain with sitting, sleeping and bending    Time 4    Period Weeks    Status On-going  PT SHORT TERM GOAL #3   Title Lumbar flexion improved to 40, extension to 15 degrees needed for personal care, assisting his mother and usual ADLS    Time 4    Period Weeks    Status On-going             PT Long Term Goals - 07/19/20 1753      PT LONG TERM GOAL #1   Title Pt will be independent with his advanced HEP to improve ROM and  strength  with daily activity after d/c from PT.    Time 8    Period Weeks    Status New    Target Date 09/13/20      PT LONG TERM GOAL #2   Title Pt will report  at least 60% improvement in back pain with sitting, sleeping and bending    Time 8    Period Weeks    Status New      PT LONG TERM GOAL #3   Title The patient will have grossly 4+/5 lumbar/pelvic/hip strength  needed for lifting/carrying    Time 8    Period Weeks    Status New      PT LONG TERM GOAL #4   Title FOTO functional outcome score improved from 42% to 59%    Time 8    Period Weeks    Status New      PT LONG TERM GOAL #5   Title ...      PT LONG TERM GOAL #6   Title ...      PT LONG TERM GOAL #7   Title ...      PT LONG TERM GOAL #8   Title .Marland KitchenMarland Kitchen                 Plan -  08/10/20 0857    Clinical Impression Statement Pt arrives with moderate LT low back pain after he reports "moving a lot of things yesterday." Pt was educated in using a hip hinge when lifting. Pt could demonstrate correctly multiple times with 5# KB and 10# box from the 8" box. Back pain reduced some by end of treatment.    Personal Factors and Comorbidities Comorbidity 1;Comorbidity 2;Comorbidity 3+;Past/Current Experience    Comorbidities chronic history spinal pain, anxiety, depression, migraines    Examination-Activity Limitations Lift;Caring for Others;Locomotion Level;Sit;Sleep;Stand    Examination-Participation Restrictions Interpersonal Relationship;Cleaning;Community Activity;Driving    Stability/Clinical Decision Making Stable/Uncomplicated    Rehab Potential Good    PT Frequency 2x / week    PT Duration 8 weeks    PT Treatment/Interventions ADLs/Self Care Home Management;Electrical Stimulation;Cryotherapy;Aquatic Therapy;Moist Heat;Traction;Ultrasound;Neuromuscular re-education;Therapeutic exercise;Therapeutic activities;Patient/family education;Manual techniques;Dry needling;Taping;Spinal Manipulations;Iontophoresis 4mg /ml Dexamethasone    PT Next Visit Plan Lifting task from floor, core and back strength, bike for LE strength and endurance in order to lift properly.    PT Home Exercise Plan [id number]    Consulted and Agree with Plan of Care Patient           Patient will benefit from skilled therapeutic intervention in order to improve the following deficits and impairments:  Decreased range of motion,Pain,Impaired perceived functional ability,Decreased strength,Postural dysfunction,Improper body mechanics,Increased muscle spasms  Visit Diagnosis: Chronic left-sided low back pain with left-sided sciatica  Other muscle spasm  Muscle weakness (generalized)     Problem List Patient Active Problem List   Diagnosis Date Noted  . Arthritis of right acromioclavicular joint  02/18/2020  . Right rotator cuff tear 12/18/2019  . Degenerative cervical  disc 05/07/2019  . Left lumbar radiculopathy 10/10/2017  . Migraine without aura and without status migrainosus, not intractable 06/22/2017  . Vitamin D deficiency 05/28/2017  . Scrotal pain 05/28/2017  . Paroxysmal SVT (supraventricular tachycardia) ([place]) 05/28/2017  . Mild left inguinal hernia 11/23/2016  . Chronic pelvic pain in male 09/28/2016  . Abdominal pain, left lower quadrant 09/28/2016  . Isolated proteinuria without specific morphologic lesion 06/27/2016  . Left testicular pain 06/27/2016  . Acute left flank pain 06/27/2016  . Epididymitis, left 06/05/2016  . Pelvic floor dysfunction 04/01/2016  . GERD (gastroesophageal reflux disease) 10/13/2015  . Gastroparesis 10/13/2015  . Intractable chronic migraine without aura and without status migrainosus 12/15/2013  . Hyperlipidemia 04/04/2012  . Mitral valve disease 07/15/2009  . IRRITABLE BOWEL SYNDROME 07/15/2009  . Palpitations 06/28/2009    Callia Swim, PTA 08/10/2020, 9:23 AM  [facility] [address] [place], [name], [address] Phone: [phone]   Fax:  [phone]  Name: Mac Dowdell MRN: [id number] Date of Birth: 1961/04/29

## 2020-08-15 ENCOUNTER — Ambulatory Visit: Payer: Self-pay | Attending: Critical Care Medicine

## 2020-08-15 ENCOUNTER — Encounter: Payer: PPO | Admitting: Physical Therapy

## 2020-08-15 ENCOUNTER — Encounter: Payer: Self-pay | Admitting: Family Medicine

## 2020-08-15 DIAGNOSIS — Z20822 Contact with and (suspected) exposure to covid-19: Secondary | ICD-10-CM | POA: Diagnosis not present

## 2020-08-17 ENCOUNTER — Encounter: Payer: PPO | Admitting: Physical Therapy

## 2020-08-22 ENCOUNTER — Other Ambulatory Visit: Payer: Self-pay

## 2020-08-22 ENCOUNTER — Ambulatory Visit: Payer: PPO | Admitting: Physical Therapy

## 2020-08-22 ENCOUNTER — Encounter: Payer: Self-pay | Admitting: Physical Therapy

## 2020-08-22 DIAGNOSIS — M5442 Lumbago with sciatica, left side: Secondary | ICD-10-CM | POA: Diagnosis not present

## 2020-08-22 DIAGNOSIS — G8929 Other chronic pain: Secondary | ICD-10-CM

## 2020-08-22 DIAGNOSIS — R293 Abnormal posture: Secondary | ICD-10-CM

## 2020-08-22 DIAGNOSIS — M25511 Pain in right shoulder: Secondary | ICD-10-CM

## 2020-08-22 DIAGNOSIS — M25552 Pain in left hip: Secondary | ICD-10-CM

## 2020-08-22 DIAGNOSIS — M6281 Muscle weakness (generalized): Secondary | ICD-10-CM

## 2020-08-22 DIAGNOSIS — M62838 Other muscle spasm: Secondary | ICD-10-CM

## 2020-08-22 NOTE — Therapy (Signed)
[facility] [address] [place], [name], [address] Phone: [phone]   Fax:  [phone]  Physical Therapy Treatment  Patient Details  Name: William [name] MRN: [id number] Date of Birth: 09/11/1960 Referring Provider (PT): Dr. [name]   Encounter Date: 08/22/2020   PT End of Session - 08/22/20 0931    Visit Number 8    Date for PT Re-Evaluation 09/13/20    Authorization Type [employer]    PT Start Time 0930    PT Stop Time 1020    PT Time Calculation (min) 50 min    Activity Tolerance Patient limited by pain;Patient limited by fatigue;Patient limited by lethargy    Behavior During Therapy Anxious           Past Medical History:  Diagnosis Date  . Allergy   . Anxiety   . Depression   . Gastroparesis   . Heart murmur   . Migraine headache   . Mitral valve prolapse   . SVT (supraventricular tachycardia) ([place])     History reviewed. No pertinent surgical history.  There were no vitals filed for this visit.   Subjective Assessment - 08/22/20 0932    Subjective This is not a good day.William KitchenMarland KitchenI have a headache this AM.    Pertinent History supraspinatus tendinosis; mild A/C arthritis;  history of LBP;  anxiety,depression, migraines;  previous pt of BF [name], [place])    Currently in Pain? Yes   Headache                            [facility] Adult PT Treatment/Exercise - 08/22/20 0001      Neck Exercises: Supine   Cervical Rotation --   onsoft ball 20x     Shoulder Exercises: Supine   Other Supine Exercises Cane flexion stretch overhead 10x      Cryotherapy   Number Minutes Cryotherapy 10 Minutes    Cryotherapy Location Cervical    Type of Cryotherapy Ice pack      Manual Therapy   Soft tissue mobilization Bil Scalenes, scapula, SCM Bil multiple painful trigger points Lt > RT                    PT Short Term Goals - 07/26/20 1447      PT SHORT TERM GOAL #1    Title Pt will be independent with his initial HEP to promote healing and pain reduction    Status Achieved      PT SHORT TERM GOAL #2   Title The patient will report a 40% improvement in back pain with sitting, sleeping and bending    Time 4    Period Weeks    Status On-going      PT SHORT TERM GOAL #3   Title Lumbar flexion improved to 40, extension to 15 degrees needed for personal care, assisting his mother and usual ADLS    Time 4    Period Weeks    Status On-going             PT Long Term Goals - 07/19/20 1753      PT LONG TERM GOAL #1   Title Pt will be independent with his advanced HEP to improve ROM and  strength  with daily activity after d/c from PT.    Time 8    Period Weeks    Status New    Target Date 09/13/20  PT LONG TERM GOAL #2   Title Pt will report  at least 60% improvement in back pain with sitting, sleeping and bending    Time 8    Period Weeks    Status New      PT LONG TERM GOAL #3   Title The patient will have grossly 4+/5 lumbar/pelvic/hip strength  needed for lifting/carrying    Time 8    Period Weeks    Status New      PT LONG TERM GOAL #4   Title FOTO functional outcome score improved from 42% to 59%    Time 8    Period Weeks    Status New      PT LONG TERM GOAL #5   Title ...      PT LONG TERM GOAL #6   Title ...      PT LONG TERM GOAL #7   Title ...      PT LONG TERM GOAL #8   Title .William KitchenMarland [name]                 Plan - 08/22/20 1007    Clinical Impression Statement Pt arrives with headache and high stress levels after taking care of his wife and elderly mother as his wife was sick with Covid last week. Pt reports doing a lot of forward bending to clean. Shoulder and neck mostly painful, "my low back actually did ok." Pt had multiple active trigger points along Lt scapula and cervical mucles Lt > RT. The resting tone improved with manual work.    Personal Factors and Comorbidities Comorbidity 1;Comorbidity 2;Comorbidity  3+;Past/Current Experience    Comorbidities chronic history spinal pain, anxiety, depression, migraines    Examination-Activity Limitations Lift;Caring for Others;Locomotion Level;Sit;Sleep;Stand    Examination-Participation Restrictions Interpersonal Relationship;Cleaning;Community Activity;Driving    Stability/Clinical Decision Making Stable/Uncomplicated    Rehab Potential Good    PT Frequency 2x / week    PT Duration 8 weeks    PT Treatment/Interventions ADLs/Self Care Home Management;Electrical Stimulation;Cryotherapy;Aquatic Therapy;Moist Heat;Traction;Ultrasound;Neuromuscular re-education;Therapeutic exercise;Therapeutic activities;Patient/family education;Manual techniques;Dry needling;Taping;Spinal Manipulations;Iontophoresis 4mg /ml Dexamethasone    PT Next Visit Plan Asses pain, pt's stress/anxiety levels: if better resume lifting task from floor, core and back strength, bike for LE strength and endurance in order to lift properly. Pt may benefit from trigger point therapy if headache persists.    PT Home Exercise Plan [id number]    Consulted and Agree with Plan of Care Patient           Patient will benefit from skilled therapeutic intervention in order to improve the following deficits and impairments:  Decreased range of motion,Pain,Impaired perceived functional ability,Decreased strength,Postural dysfunction,Improper body mechanics,Increased muscle spasms  Visit Diagnosis: Other muscle spasm  Chronic left-sided low back pain with left-sided sciatica  Muscle weakness (generalized)  Right shoulder pain, unspecified chronicity  Pain in left hip  Abnormal posture     Problem List Patient Active Problem List   Diagnosis Date Noted  . Arthritis of right acromioclavicular joint 02/18/2020  . Right rotator cuff tear 12/18/2019  . Degenerative cervical disc 05/07/2019  . Left lumbar radiculopathy 10/10/2017  . Migraine without aura and without status migrainosus, not  intractable 06/22/2017  . Vitamin D deficiency 05/28/2017  . Scrotal pain 05/28/2017  . Paroxysmal SVT (supraventricular tachycardia) ([place]) 05/28/2017  . Mild left inguinal hernia 11/23/2016  . Chronic pelvic pain in male 09/28/2016  . Abdominal pain, left lower quadrant 09/28/2016  . Isolated proteinuria without specific morphologic lesion 06/27/2016  .  Left testicular pain 06/27/2016  . Acute left flank pain 06/27/2016  . Epididymitis, left 06/05/2016  . Pelvic floor dysfunction 04/01/2016  . GERD (gastroesophageal reflux disease) 10/13/2015  . Gastroparesis 10/13/2015  . Intractable chronic migraine without aura and without status migrainosus 12/15/2013  . Hyperlipidemia 04/04/2012  . Mitral valve disease 07/15/2009  . IRRITABLE BOWEL SYNDROME 07/15/2009  . Palpitations 06/28/2009    Rider Ermis, PTA 08/22/2020, 10:13 AM   [facility] [address] [place], [name], [address] Phone: [phone]   Fax:  [phone]  Name: Ori Trejos MRN: [id number] Date of Birth: 03-Feb-1961

## 2020-08-25 ENCOUNTER — Encounter: Payer: PPO | Admitting: Physical Therapy

## 2020-08-26 ENCOUNTER — Other Ambulatory Visit: Payer: Self-pay

## 2020-08-26 ENCOUNTER — Ambulatory Visit: Payer: PPO | Admitting: Physical Therapy

## 2020-08-26 ENCOUNTER — Encounter: Payer: Self-pay | Admitting: Physical Therapy

## 2020-08-26 DIAGNOSIS — M5442 Lumbago with sciatica, left side: Secondary | ICD-10-CM

## 2020-08-26 DIAGNOSIS — M6281 Muscle weakness (generalized): Secondary | ICD-10-CM

## 2020-08-26 DIAGNOSIS — R293 Abnormal posture: Secondary | ICD-10-CM

## 2020-08-26 DIAGNOSIS — G8929 Other chronic pain: Secondary | ICD-10-CM

## 2020-08-26 DIAGNOSIS — M25552 Pain in left hip: Secondary | ICD-10-CM

## 2020-08-26 DIAGNOSIS — M25511 Pain in right shoulder: Secondary | ICD-10-CM

## 2020-08-26 DIAGNOSIS — M62838 Other muscle spasm: Secondary | ICD-10-CM

## 2020-08-26 NOTE — Therapy (Signed)
[facility] [address], [place], [name], [address] Phone: [phone]   Fax:  [phone]  Physical Therapy Treatment  Patient Details  Name: William [name] MRN: [id number] Date of Birth: [birth year] Referring Provider (PT): Dr. [name]   Encounter Date: 08/26/2020   PT End of Session - 08/26/20 0806    Visit Number 9    Date for PT Re-Evaluation 09/13/20    Authorization Type [employer]    PT Start Time 0805    PT Stop Time 0843    PT Time Calculation (min) 38 min    Activity Tolerance Patient tolerated treatment well    Behavior During Therapy [facility] for tasks assessed/performed           Past Medical History:  Diagnosis Date  . Allergy   . Anxiety   . Depression   . Gastroparesis   . Heart murmur   . Migraine headache   . Mitral valve prolapse   . SVT (supraventricular tachycardia) ([place])     History reviewed. No pertinent surgical history.  There were no vitals filed for this visit.   Subjective Assessment - 08/26/20 0808    Subjective I feel much better this AM. The massage really helped my headache    Pertinent History supraspinatus tendinosis; mild A/C arthritis;  history of LBP;  anxiety,depression, migraines;  previous pt of BF [name], [place])    Currently in Pain? No/denies    Multiple Pain Sites No                             [facility] Adult PT Treatment/Exercise - 08/26/20 0001      Lumbar Exercises: Aerobic   UBE (Upper Arm Bike) L1.5 2x2    Nustep L1 x 6 min      Lumbar Exercises: Supine   Other Supine Lumbar Exercises Review and performance of soft foam roll routine focusing on upper quarter mobility for neck and shoulder, core strength and lumbar mobility.                    PT Short Term Goals - 07/26/20 1447      PT SHORT TERM GOAL #1   Title Pt will be independent with his initial HEP to promote healing and pain reduction    Status  Achieved      PT SHORT TERM GOAL #2   Title The patient will report a 40% improvement in back pain with sitting, sleeping and bending    Time 4    Period Weeks    Status On-going      PT SHORT TERM GOAL #3   Title Lumbar flexion improved to 40, extension to 15 degrees needed for personal care, assisting his mother and usual ADLS    Time 4    Period Weeks    Status On-going             PT Long Term Goals - 07/19/20 1753      PT LONG TERM GOAL #1   Title Pt will be independent with his advanced HEP to improve ROM and  strength  with daily activity after d/c from PT.    Time 8    Period Weeks    Status New    Target Date 09/13/20      PT LONG TERM GOAL #2   Title Pt will report  at least 60% improvement in back pain  with sitting, sleeping and bending    Time 8    Period Weeks    Status New      PT LONG TERM GOAL #3   Title The patient will have grossly 4+/5 lumbar/pelvic/hip strength  needed for lifting/carrying    Time 8    Period Weeks    Status New      PT LONG TERM GOAL #4   Title FOTO functional outcome score improved from 42% to 59%    Time 8    Period Weeks    Status New      PT LONG TERM GOAL #5   Title ...      PT LONG TERM GOAL #6   Title ...      PT LONG TERM GOAL #7   Title ...      PT LONG TERM GOAL #8   Title .Marland KitchenMarland Kitchen                 Plan - 08/26/20 0810    Clinical Impression Statement Pt arrives pain free today, reports the soft tissue work really helped get rid of my my headache. Pt requested to review and "do" the foam roll exercises we reccomended for home. He has not been able to them in a few weeks and felt he needed a refresher. Pt and wife are looking into joining the [facility] and possibly do some pool exercises.    Personal Factors and Comorbidities Comorbidity 1;Comorbidity 2;Comorbidity 3+;Past/Current Experience    Comorbidities chronic history spinal pain, anxiety, depression, migraines    Examination-Activity Limitations  Lift;Caring for Others;Locomotion Level;Sit;Sleep;Stand    Stability/Clinical Decision Making Stable/Uncomplicated    Rehab Potential Good    PT Frequency 2x / week    PT Duration 8 weeks    PT Treatment/Interventions ADLs/Self Care Home Management;Electrical Stimulation;Cryotherapy;Aquatic Therapy;Moist Heat;Traction;Ultrasound;Neuromuscular re-education;Therapeutic exercise;Therapeutic activities;Patient/family education;Manual techniques;Dry needling;Taping;Spinal Manipulations;Iontophoresis 4mg /ml Dexamethasone    PT Next Visit Plan Continue to help Pt develop a home plan/program. FOTO next 1-2 visits for goal check.    PT Home Exercise Plan [email or web address]    Consulted and Agree with Plan of Care Patient           Patient will benefit from skilled therapeutic intervention in order to improve the following deficits and impairments:  Decreased range of motion,Pain,Impaired perceived functional ability,Decreased strength,Postural dysfunction,Improper body mechanics,Increased muscle spasms  Visit Diagnosis: Other muscle spasm  Chronic left-sided low back pain with left-sided sciatica  Muscle weakness (generalized)  Right shoulder pain, unspecified chronicity  Pain in left hip  Abnormal posture     Problem List Patient Active Problem List   Diagnosis Date Noted  . Arthritis of right acromioclavicular joint 02/18/2020  . Right rotator cuff tear 12/18/2019  . Degenerative cervical disc 05/07/2019  . Left lumbar radiculopathy 10/10/2017  . Migraine without aura and without status migrainosus, not intractable 06/22/2017  . Vitamin D deficiency 05/28/2017  . Scrotal pain 05/28/2017  . Paroxysmal SVT (supraventricular tachycardia) ([place]) 05/28/2017  . Mild left inguinal hernia 11/23/2016  . Chronic pelvic pain in male 09/28/2016  . Abdominal pain, left lower quadrant 09/28/2016  . Isolated proteinuria without specific morphologic lesion 06/27/2016  . Left testicular pain 06/27/2016   . Acute left flank pain 06/27/2016  . Epididymitis, left 06/05/2016  . Pelvic floor dysfunction 04/01/2016  . GERD (gastroesophageal reflux disease) 10/13/2015  . Gastroparesis 10/13/2015  . Intractable chronic migraine without aura and without status migrainosus 12/15/2013  . Hyperlipidemia 04/04/2012  . Mitral valve  disease 07/15/2009  . IRRITABLE BOWEL SYNDROME 07/15/2009  . Palpitations 06/28/2009    Lissete Maestas, PTA 08/26/2020, 8:43 AM  [facility] [address] [place], [name], [address] Phone: [phone]   Fax:  [phone]  Name: Kooper Godshall MRN: [id number] Date of Birth: 02-Feb-1961

## 2020-08-30 ENCOUNTER — Encounter: Payer: PPO | Admitting: Physical Therapy

## 2020-08-30 DIAGNOSIS — R102 Pelvic and perineal pain: Secondary | ICD-10-CM | POA: Diagnosis not present

## 2020-08-30 DIAGNOSIS — R002 Palpitations: Secondary | ICD-10-CM | POA: Diagnosis not present

## 2020-08-30 DIAGNOSIS — M6289 Other specified disorders of muscle: Secondary | ICD-10-CM | POA: Diagnosis not present

## 2020-08-30 DIAGNOSIS — N50812 Left testicular pain: Secondary | ICD-10-CM | POA: Diagnosis not present

## 2020-08-30 DIAGNOSIS — I471 Supraventricular tachycardia: Secondary | ICD-10-CM | POA: Diagnosis not present

## 2020-08-30 DIAGNOSIS — N5082 Scrotal pain: Secondary | ICD-10-CM | POA: Diagnosis not present

## 2020-08-30 DIAGNOSIS — M5416 Radiculopathy, lumbar region: Secondary | ICD-10-CM | POA: Diagnosis not present

## 2020-08-30 DIAGNOSIS — E538 Deficiency of other specified B group vitamins: Secondary | ICD-10-CM | POA: Diagnosis not present

## 2020-08-30 DIAGNOSIS — F438 Other reactions to severe stress: Secondary | ICD-10-CM | POA: Diagnosis not present

## 2020-08-30 DIAGNOSIS — M7541 Impingement syndrome of right shoulder: Secondary | ICD-10-CM | POA: Diagnosis not present

## 2020-08-30 DIAGNOSIS — F4323 Adjustment disorder with mixed anxiety and depressed mood: Secondary | ICD-10-CM | POA: Diagnosis not present

## 2020-08-30 DIAGNOSIS — D519 Vitamin B12 deficiency anemia, unspecified: Secondary | ICD-10-CM | POA: Diagnosis not present

## 2020-08-30 NOTE — Progress Notes (Signed)
NEUROLOGY FOLLOW UP OFFICE NOTE  William [name] [id number]  Assessment/Plan:   1.  Migraine without aura, without status migrainosus, not intractable 2.  Benign essential tremor - very mild, likely aggravated by emotional stress and sleep deprivation - wouldn't treat at this time  1.  Migraine prevention:  Emgality 2.  Migraine rescue:  Nurtec (refilled) and Zofran 3.  Limit use of pain relievers to no more than 2 days out of week to prevent risk of rebound or medication-overuse headache. 4.  Keep headache diary 5.  Monitor tremor 6.  Follow up one year  Subjective:  William [name] is a 60 year old right-handed male withhyperlipidemia, MVP, IBS, gastroparesis, generalized anxiety disorder and palpitations whofollows up for migraines.  UPDATE: About 3 or 4 months ago, he was in the doughnut hole, he missed Emgality and had increase in migraines.  He has since signed up for financial assistance.   Restarted Nurtec in July.  He is now feeling better.  No headaches for past 2 weeks.   Intensity:Moderate to severe Duration:Less than 15 minutes with Nurtec Frequency:Last migraine in December. Frequency of abortive medication:5 days in past 30 days  He also reports longstanding history of tremor.  It involves the left hand.  It occurs with use, not at rest.  Over the past 10 years, it has gotten a little worse.  He does report increased emotional stress and poor sleep.  No problems with walking.  No family history of tremor.    Current NSAIDS:none Current analgesics:None Current triptans:Contraindicated (MVP) Current ergotamine:none Current anti-emetic:Zofran4mg  Current muscle relaxants:baclofen 10mg  TID Current anti-anxiolytic:none Current sleep aide:none Current Antihypertensive medications:Cardizem Current Antidepressant medications:none Current Anticonvulsant medications:none Current anti-CGRP:Emgality; Nurtec Current  Vitamins/Herbal/Supplements:D Current Antihistamines/Decongestants:Claritin, Flonase Other therapy:none  Caffeine:No coffee, tea or caffeinated soda Diet:Needs to improve. Sometimes ginger ale Exercise: No Depression:no; Anxiety:Yes. stress related to caring for his mother Other pain:pulled a muscle in neck - improved with PT Sleep hygiene:Poor. Stress related to caregiver for his mother  HISTORY: Onset: 1986. Location: 90% of time: Left retro-orbital region, 10% bi-frontal Quality:stabbing Initial intensity: Fluctuates from 4/10-10/10 Aura:no Prodrome:no Associated symptoms: Lightheadedness, fatigue, nausea, tearing of left eye. Initial duration:4 hours Initial frequency:3 to 4 times a week Triggers/aggravating factors: Afrin, odors (perfumes, cigarette, chinese food), MSG, salt, stress, extreme temperatures Relieving factors: Water Activity:About 4 to 5 days a week, cannot function  Past NSAIDS:Indomethacin Past analgesics:Nucynta, lidocaine patches, tramadol, nalbuphine, Tylox, bupivacaine injections, hydrocodone, morphine, Tylenol #2, Tylenol Past abortive triptans:Sumatriptan po/[place], Maxalt, Relpax, Amerge, Zomig Past abortive ergotamine:Migranal, DHE protocol Past muscle relaxants:Robaxin, Flexeril Past anti-emetic:Reglan, Phenergan Past anxiolytics: Temazeam, clonazepam Past antihypertensive medications:Inderal, metoprolol, verapamil Past antidepressant medications:Nortriptyline, amitriptyline, venlafaxine, Wellbutrin, Remeron Past anticonvulsant medications:Depakote, topiramate (weight loss, diarrhea), [place], gabapentin Past anti-CGRP:none Past vitamins/Herbal/Supplements:petasites Past antihistamines/decongestants:none Other past therapies:Biofeedback, acupuncture, counseling   He has had multiple imaging of the head, all unremarkable, including: CT HEAD from 06/01/09 and 07/30/09. MRI BRAIN  W/WO from 12/08/02, 01/19/05, 04/18/12. MRI of brain ordered by me from 11/19/2015 was personally reviewed and was normal.  CERVICAL XR from 01/15/08 showed mild degenerative disc disease at C5-6.  He has seen multiple neurologists and headache and pain specialists, including Dr. Morene Antu, Dr. Asencion Partridge Dohmeieir, Dr. Earley Favor, Dr. Melton Alar, Dr. Rock Nephew at [facility] and he was seen at the [facility].  He also has history of some obsessive compulsive symptoms since he was young.For example, he repeatedly washes his hands and always uses a new cup during the day when he  drinks something.He has history of low blood pressure and has been evaluated by cardiology for palpitations.Work up was unremarkable.  PAST MEDICAL HISTORY: Past Medical History:  Diagnosis Date  . Allergy   . Anxiety   . Depression   . Gastroparesis   . Heart murmur   . Migraine headache   . Mitral valve prolapse   . SVT (supraventricular tachycardia) (HCC)     MEDICATIONS: Current Outpatient Medications on File Prior to Visit  Medication Sig Dispense Refill  . baclofen (LIORESAL) 10 MG tablet Take 10 mg by mouth 3 (three) times daily.    [name] COVID-19 mRNA vaccine, Pfizer, 30 MCG/0.3ML injection INJECT AS DIRECTED .3 mL 0  . diltiazem (CARDIZEM CD) 180 MG 24 hr capsule Take 1 capsule (180 mg total) by mouth daily. 90 capsule 3  . diltiazem (CARDIZEM) 30 MG tablet TAKE (1) TABLET AS NEEDED FOR PALPITATIONS 30 tablet 5  . EMGALITY 120 MG/ML SOAJ     . fluticasone (FLONASE) 50 MCG/ACT nasal spray Place 2 sprays into both nostrils daily. 16 g 6  . metoCLOPramide (REGLAN) 5 MG tablet Take 5 mg by mouth as needed.    . ondansetron (ZOFRAN) 4 MG tablet Take 4 mg by mouth as needed.  (Patient not taking: Reported on 07/19/2020)    . Rimegepant Sulfate (NURTEC) 75 MG TBDP Take 75 mg by mouth as needed for up to 1 dose (Maximum 1 tablet in 24 hours.). 8 tablet 11   No current facility-administered medications on file  prior to visit.    ALLERGIES: Allergies  Allergen Reactions  . Erythromycin Anaphylaxis and Nausea And Vomiting  . Nalbuphine Other (See Comments)    States loss of consciousness   . Cefdinir Diarrhea, Nausea And Vomiting and Rash  . Cephalexin Diarrhea and Nausea And Vomiting  . Cephalosporins Diarrhea and Nausea And Vomiting  . Hydrocodone-Acetaminophen Nausea And Vomiting  . Ketorolac Nausea And Vomiting  . Prochlorperazine Edisylate Other (See Comments)    uncontrollable shake - tremor   . Restasis [Cyclosporine] Other (See Comments)    migraines  . Tyloxapol Swelling  . Propranolol Diarrhea  . Topamax [Topiramate] Diarrhea and Nausea And Vomiting  . Zetia [Ezetimibe] Diarrhea  . Doxycycline Hyclate Nausea Only and Other (See Comments)    abd pain  . Iodinated Diagnostic Agents Other (See Comments)    Lightheaded, flushed feeling (explained to patient these are normal side effects of IV iodinated contrast, not allergies, but he wanted this noted in the epic; he tolerated epidural steroid injections w/ contrast w/o difficulty)  . Ketoconazole Itching  . Latex Itching  . Neosporin [Neomycin-Bacitracin Zn-Polymyx] Rash  . Oxycodone-Acetaminophen Other (See Comments)    Tylox caused constipation  . Prednisone Nausea And Vomiting    Oral route, only    FAMILY HISTORY: Family History  Problem Relation Age of Onset  . Hypertension Mother   . Hypertension Brother   . Kidney failure Maternal Grandmother   . Prostate cancer Neg Hx   . Kidney cancer Neg Hx       Objective:  Blood pressure 117/67, pulse 85, resp. rate 20, height 5\' 10"  (1.778 m), weight 142 lb (64.4 kg), SpO2 100 %. General: No acute distress.  Patient appears well-groomed.   Head:  Normocephalic/atraumatic Eyes:  Fundi examined but not visualized Neck: supple, no paraspinal tenderness, full range of motion Heart:  Regular rate and rhythm Lungs:  Clear to auscultation bilaterally Back: No paraspinal  tenderness Neurological Exam: alert and oriented  to person, place, and time. Speech fluent and not dysarthric, language intact.  CN II-XII intact. Bulk and tone normal, muscle strength 5/5 throughout.  Very mild/fine postural tremor in left hand.  No rigidity or bradykinesia.  Sensation to light touch, temperature and vibration intact.  Deep tendon reflexes 2+ throughout, toes downgoing.  Finger to nose and heel to shin testing intact.  Gait normal, Romberg negative.     Metta Clines, DO  CC: [name], MD

## 2020-08-31 ENCOUNTER — Ambulatory Visit: Payer: PPO | Attending: Family Medicine | Admitting: Physical Therapy

## 2020-08-31 ENCOUNTER — Other Ambulatory Visit: Payer: Self-pay

## 2020-08-31 ENCOUNTER — Encounter: Payer: Self-pay | Admitting: Physical Therapy

## 2020-08-31 DIAGNOSIS — G8929 Other chronic pain: Secondary | ICD-10-CM | POA: Diagnosis not present

## 2020-08-31 DIAGNOSIS — M5442 Lumbago with sciatica, left side: Secondary | ICD-10-CM | POA: Diagnosis not present

## 2020-08-31 DIAGNOSIS — R293 Abnormal posture: Secondary | ICD-10-CM | POA: Insufficient documentation

## 2020-08-31 DIAGNOSIS — M25552 Pain in left hip: Secondary | ICD-10-CM | POA: Insufficient documentation

## 2020-08-31 DIAGNOSIS — M25511 Pain in right shoulder: Secondary | ICD-10-CM | POA: Diagnosis not present

## 2020-08-31 DIAGNOSIS — M6281 Muscle weakness (generalized): Secondary | ICD-10-CM | POA: Diagnosis not present

## 2020-08-31 DIAGNOSIS — M62838 Other muscle spasm: Secondary | ICD-10-CM | POA: Insufficient documentation

## 2020-08-31 NOTE — Therapy (Addendum)
[facility] [address] [place], [name], [address] Phone: [phone]   Fax:  [phone]  Physical Therapy Treatment  Patient Details  Name: William [name] MRN: [id number] Date of Birth: 03-18-1961 Referring Provider (PT): Dr. [name]  Progress Note Reporting Period 07/19/20 to 09/02/20  See note below for Objective Data and Assessment of Progress/Goals.      Encounter Date: 08/31/2020   PT End of Session - 08/31/20 1016    Visit Number 10    Date for PT Re-Evaluation 09/13/20    Authorization Type [employer]    PT Start Time 0930    PT Stop Time 1016    PT Time Calculation (min) 46 min    Activity Tolerance Patient tolerated treatment well    Behavior During Therapy WFL for tasks assessed/performed           Past Medical History:  Diagnosis Date  . Allergy   . Anxiety   . Depression   . Gastroparesis   . Heart murmur   . Migraine headache   . Mitral valve prolapse   . SVT (supraventricular tachycardia) ([place])     History reviewed. No pertinent surgical history.  There were no vitals filed for this visit.   Subjective Assessment - 08/31/20 0944    Subjective I am just feeling the stresses of life today, so my back feels like a 5/10 today.    Pertinent History supraspinatus tendinosis; mild A/C arthritis;  history of LBP;  anxiety,depression, migraines;  previous pt of BF [name], [place])    Currently in Pain? Yes    Pain Score 5     Pain Location Back    Pain Orientation Lower;Upper;Left    Pain Descriptors / Indicators Aching;Radiating    Aggravating Factors  Stress    Pain Relieving Factors rest, relaxation, exercise    Multiple Pain Sites No                             [facility] Adult PT Treatment/Exercise - 08/31/20 0001      Lumbar Exercises: Aerobic   UBE (Upper Arm Bike) L1.5 2x2   VC to pay attention to his body and try to breathe and relax when he feels  increasing stress/tension.   Nustep L1 x 8 min with PTA present to discuss status      Lumbar Exercises: Supine   Other Supine Lumbar Exercises Soft foam roll series for lumbar, thoracic, and cervical release.                    PT Short Term Goals - 07/26/20 1447      PT SHORT TERM GOAL #1   Title Pt will be independent with his initial HEP to promote healing and pain reduction    Status Achieved      PT SHORT TERM GOAL #2   Title The patient will report a 40% improvement in back pain with sitting, sleeping and bending    Time 4    Period Weeks    Status On-going      PT SHORT TERM GOAL #3   Title Lumbar flexion improved to 40, extension to 15 degrees needed for personal care, assisting his mother and usual ADLS    Time 4    Period Weeks    Status On-going             PT Long Term Goals -  07/19/20 1753      PT LONG TERM GOAL #1   Title Pt will be independent with his advanced HEP to improve ROM and  strength  with daily activity after d/c from PT.    Time 8    Period Weeks    Status New    Target Date 09/13/20      PT LONG TERM GOAL #2   Title Pt will report  at least 60% improvement in back pain with sitting, sleeping and bending    Time 8    Period Weeks    Status New      PT LONG TERM GOAL #3   Title The patient will have grossly 4+/5 lumbar/pelvic/hip strength  needed for lifting/carrying    Time 8    Period Weeks    Status New      PT LONG TERM GOAL #4   Title FOTO functional outcome score improved from 42% to 59%    Time 8    Period Weeks    Status New      PT LONG TERM GOAL #5   Title ...      PT LONG TERM GOAL #6   Title ...      PT LONG TERM GOAL #7   Title ...      PT LONG TERM GOAL #8   Title .Marland KitchenMarland Kitchen                 Plan - 08/31/20 0955    Clinical Impression Statement Pt arrives today reporting he feels "life stressors today" and admitted there most likely is a connection to this stress and how his back feels.When he  feels higher levels of life stress typically compliance to HEP also lessens.  Pt reports he saw Dr Brigitte Pulse yesterday and they discussed doing a few visits in the pool. Pt is aware of the back log of appointments at this time. There will be 1 opening on MAy 27th which he may sign up for to get an initial visit in and see if he likes the aquatic therapy. if he does, he can then sign up for more in July.    Personal Factors and Comorbidities Comorbidity 1;Comorbidity 2;Comorbidity 3+;Past/Current Experience    Comorbidities chronic history spinal pain, anxiety, depression, migraines    Examination-Activity Limitations Lift;Caring for Others;Locomotion Level;Sit;Sleep;Stand    Examination-Participation Restrictions Interpersonal Relationship;Cleaning;Community Activity;Driving    Stability/Clinical Decision Making Stable/Uncomplicated    Rehab Potential Good    PT Frequency 2x / week    PT Duration 8 weeks    PT Treatment/Interventions ADLs/Self Care Home Management;Electrical Stimulation;Cryotherapy;Aquatic Therapy;Moist Heat;Traction;Ultrasound;Neuromuscular re-education;Therapeutic exercise;Therapeutic activities;Patient/family education;Manual techniques;Dry needling;Taping;Spinal Manipulations;Iontophoresis 4mg /ml Dexamethasone    PT Next Visit Plan FOTO, see if pt is up for more strengthening ( general and core/back,    PT Home Exercise Plan [id number]    Consulted and Agree with Plan of Care Patient           Patient will benefit from skilled therapeutic intervention in order to improve the following deficits and impairments:  Decreased range of motion,Pain,Impaired perceived functional ability,Decreased strength,Postural dysfunction,Improper body mechanics,Increased muscle spasms  Visit Diagnosis: Other muscle spasm  Chronic left-sided low back pain with left-sided sciatica  Muscle weakness (generalized)  Right shoulder pain, unspecified chronicity  Pain in left hip  Abnormal  posture     Problem List Patient Active Problem List   Diagnosis Date Noted  . Arthritis of right acromioclavicular joint 02/18/2020  . Right rotator cuff  tear 12/18/2019  . Degenerative cervical disc 05/07/2019  . Left lumbar radiculopathy 10/10/2017  . Migraine without aura and without status migrainosus, not intractable 06/22/2017  . Vitamin D deficiency 05/28/2017  . Scrotal pain 05/28/2017  . Paroxysmal SVT (supraventricular tachycardia) ([place]) 05/28/2017  . Mild left inguinal hernia 11/23/2016  . Chronic pelvic pain in male 09/28/2016  . Abdominal pain, left lower quadrant 09/28/2016  . Isolated proteinuria without specific morphologic lesion 06/27/2016  . Left testicular pain 06/27/2016  . Acute left flank pain 06/27/2016  . Epididymitis, left 06/05/2016  . Pelvic floor dysfunction 04/01/2016  . GERD (gastroesophageal reflux disease) 10/13/2015  . Gastroparesis 10/13/2015  . Intractable chronic migraine without aura and without status migrainosus 12/15/2013  . Hyperlipidemia 04/04/2012  . Mitral valve disease 07/15/2009  . IRRITABLE BOWEL SYNDROME 07/15/2009  . Palpitations 06/28/2009   Ruben Im, PT 09/02/20 9:59 AM Phone: [phone] Fax: [phone] Araceli Arango, PTA 08/31/2020, 3:28 PM  [facility] [address] [place], [name], [address] Phone: [phone]   Fax:  [phone]  Name: William [name] MRN: [id number] Date of Birth: 07/18/1960

## 2020-09-01 ENCOUNTER — Ambulatory Visit: Payer: PPO | Admitting: Neurology

## 2020-09-01 ENCOUNTER — Other Ambulatory Visit: Payer: Self-pay

## 2020-09-01 ENCOUNTER — Encounter: Payer: Self-pay | Admitting: Neurology

## 2020-09-01 VITALS — BP 117/67 | HR 85 | Resp 20 | Ht 70.0 in | Wt 142.0 lb

## 2020-09-01 DIAGNOSIS — G250 Essential tremor: Secondary | ICD-10-CM

## 2020-09-01 DIAGNOSIS — G43009 Migraine without aura, not intractable, without status migrainosus: Secondary | ICD-10-CM

## 2020-09-01 MED ORDER — NURTEC 75 MG PO TBDP: 75.0000 mg | ORAL_TABLET | ORAL | 5 refills | Status: DC | PRN

## 2020-09-01 NOTE — Patient Instructions (Addendum)
1.  Emgality every 4 weeks 2.  Nurtec as needed (ondansetron for nausea) 3.  Limit use of pain relievers to no more than 2 days out of week to prevent risk of rebound or medication-overuse headache. 4.  Keep headache diary 5.  Monitor tremor - it is benign 6.  Follow up 1 year

## 2020-09-02 ENCOUNTER — Ambulatory Visit: Payer: PPO | Admitting: Neurology

## 2020-09-02 ENCOUNTER — Ambulatory Visit: Payer: PPO | Admitting: Physical Therapy

## 2020-09-02 DIAGNOSIS — G8929 Other chronic pain: Secondary | ICD-10-CM

## 2020-09-02 DIAGNOSIS — M6281 Muscle weakness (generalized): Secondary | ICD-10-CM

## 2020-09-02 DIAGNOSIS — M62838 Other muscle spasm: Secondary | ICD-10-CM

## 2020-09-02 DIAGNOSIS — M25511 Pain in right shoulder: Secondary | ICD-10-CM

## 2020-09-02 NOTE — Therapy (Signed)
[facility] [address], [place], [name], [address] Phone: [phone]   Fax:  [phone]  Physical Therapy Treatment  Patient Details  Name: William [name] MRN: [id number] Date of Birth: [birth year] Referring Provider (PT): Dr. [name]   Encounter Date: 09/02/2020   PT End of Session - 09/02/20 0956    Visit Number 11    Date for PT Re-Evaluation 09/13/20    Authorization Type [employer]    PT Start Time 0800    PT Stop Time 0844    PT Time Calculation (min) 44 min    Activity Tolerance Patient tolerated treatment well           Past Medical History:  Diagnosis Date  . Allergy   . Anxiety   . Depression   . Gastroparesis   . Heart murmur   . Migraine headache   . Mitral valve prolapse   . SVT (supraventricular tachycardia) (HCC)     No past surgical history on file.  There were no vitals filed for this visit.   Subjective Assessment - 09/02/20 0802    Subjective Left shoulder soreness.  I'm better and I'm doing things I couldn't do before but I haven't done heavy lifting.    Pertinent History supraspinatus tendinosis; mild A/C arthritis;  history of LBP;  anxiety,depression, migraines;  previous pt of BF [name], [place])    Patient Stated Goals get back to lifting 50#; be more flexible; be in the most optimal health a 60 year old can be    Currently in Pain? Yes    Pain Score 2     Pain Location Shoulder    Multiple Pain Sites Yes    Pain Score 0    Pain Location Back    Pain Type Chronic pain              [facility] PT Assessment - 09/02/20 0001      Observation/Other Assessments   Focus on Therapeutic Outcomes (FOTO)  69%      AROM   AROM Assessment Site --   full shoulder ROM   Lumbar Flexion 60    Lumbar Extension 28    Lumbar - Right Side Bend 28    Lumbar - Left Side Bend 26                         [facility] Adult PT Treatment/Exercise - 09/02/20 0001       Lumbar Exercises: Aerobic   UBE (Upper Arm Bike) L 1 4 2x2    Nustep L1 10 min      Lumbar Exercises: Standing   Other Standing Lumbar Exercises long discussion on exercise and effects on long term health and aging    Other Standing Lumbar Exercises discussed community based options for exercise including aquatic ex (pt still considering)      Shoulder Exercises: Standing   Other Standing Exercises review of current HEP using bands                    PT Short Term Goals - 09/02/20 0955      PT SHORT TERM GOAL #1   Title Pt will be independent with his initial HEP to promote healing and pain reduction    Status Achieved      PT SHORT TERM GOAL #2   Title The patient will report a 40% improvement in back pain with sitting, sleeping and  bending    Status Achieved      PT SHORT TERM GOAL #3   Title Lumbar flexion improved to 40, extension to 15 degrees needed for personal care, assisting his mother and usual ADLS    Status Achieved             PT Long Term Goals - 09/02/20 0955      PT LONG TERM GOAL #1   Title Pt will be independent with his advanced HEP to improve ROM and  strength  with daily activity after d/c from PT.    Time 8    Period Weeks    Status On-going      PT LONG TERM GOAL #2   Title Pt will report  at least 60% improvement in back pain with sitting, sleeping and bending    Time 8    Period Weeks    Status On-going      PT LONG TERM GOAL #3   Title The patient will have grossly 4+/5 lumbar/pelvic/hip strength  needed for lifting/carrying    Time 8    Period Weeks    Status On-going      PT LONG TERM GOAL #4   Title FOTO functional outcome score improved from 42% to 59%    Status Achieved                 Plan - 09/02/20 0840    Clinical Impression Statement The patient reports he's doing better overall with low intensity shoulder pain and no LBP today.  His doctor recommended aquatic PT but his still considering this.  He  wants to join a gym but expresses concern/fear about doing too much and having a set back.  We had a long discussion on the many positive effects of exercise (with a gradual increase in intensity) for better health and long term management of his musculoskeletal issues.  His lumbar ROM has significantly improved with both flexion and extension.  FOTO score also much improved from 42% to 69%.  He reports following treatment session today his shoulder feels "looser".  Plan was discussed to review his full HEP next visit to help with his confidence to self manage.    Comorbidities chronic history spinal pain, anxiety, depression, migraines    Examination-Activity Limitations Lift;Caring for Others;Locomotion Level;Sit;Sleep;Stand    Rehab Potential Good    PT Frequency 2x / week    PT Duration 8 weeks    PT Treatment/Interventions ADLs/Self Care Home Management;Electrical Stimulation;Cryotherapy;Aquatic Therapy;Moist Heat;Traction;Ultrasound;Neuromuscular re-education;Therapeutic exercise;Therapeutic activities;Patient/family education;Manual techniques;Dry needling;Taping;Spinal Manipulations;Iontophoresis 4mg /ml Dexamethasone    PT Next Visit Plan review all of HEP  to promote independence    PT Home Exercise Plan [email or web address]           Patient will benefit from skilled therapeutic intervention in order to improve the following deficits and impairments:  Decreased range of motion,Pain,Impaired perceived functional ability,Decreased strength,Postural dysfunction,Improper body mechanics,Increased muscle spasms  Visit Diagnosis: Other muscle spasm  Chronic left-sided low back pain with left-sided sciatica  Muscle weakness (generalized)  Right shoulder pain, unspecified chronicity     Problem List Patient Active Problem List   Diagnosis Date Noted  . Arthritis of right acromioclavicular joint 02/18/2020  . Right rotator cuff tear 12/18/2019  . Degenerative cervical disc 05/07/2019  . Left  lumbar radiculopathy 10/10/2017  . Migraine without aura and without status migrainosus, not intractable 06/22/2017  . Vitamin D deficiency 05/28/2017  . Scrotal pain 05/28/2017  . Paroxysmal SVT (supraventricular tachycardia) (  [place]) 05/28/2017  . Mild left inguinal hernia 11/23/2016  . Chronic pelvic pain in male 09/28/2016  . Abdominal pain, left lower quadrant 09/28/2016  . Isolated proteinuria without specific morphologic lesion 06/27/2016  . Left testicular pain 06/27/2016  . Acute left flank pain 06/27/2016  . Epididymitis, left 06/05/2016  . Pelvic floor dysfunction 04/01/2016  . GERD (gastroesophageal reflux disease) 10/13/2015  . Gastroparesis 10/13/2015  . Intractable chronic migraine without aura and without status migrainosus 12/15/2013  . Hyperlipidemia 04/04/2012  . Mitral valve disease 07/15/2009  . IRRITABLE BOWEL SYNDROME 07/15/2009  . Palpitations 06/28/2009   Ruben Im, PT 09/02/20 9:57 AM Phone: [phone] Fax: [phone] Alvera Singh 09/02/2020, 9:57 AM  [facility] [address] [place], [name], [address] Phone: [phone]   Fax:  [phone]  Name: Maximo Spratling MRN: [id number] Date of Birth: 23-Jun-1960

## 2020-09-06 ENCOUNTER — Ambulatory Visit: Payer: PPO | Admitting: Physical Therapy

## 2020-09-06 ENCOUNTER — Other Ambulatory Visit: Payer: Self-pay

## 2020-09-06 DIAGNOSIS — M25511 Pain in right shoulder: Secondary | ICD-10-CM

## 2020-09-06 DIAGNOSIS — M62838 Other muscle spasm: Secondary | ICD-10-CM

## 2020-09-06 DIAGNOSIS — M6281 Muscle weakness (generalized): Secondary | ICD-10-CM

## 2020-09-06 NOTE — Therapy (Signed)
[facility] [address], [place], [name], [address] Phone: [phone]   Fax:  [phone]  Physical Therapy Treatment  Patient Details  Name: William [name] MRN: [id number] Date of Birth: June 19, 1960 Referring Provider (PT): Dr. [name]   Encounter Date: 09/06/2020   PT End of Session - 09/06/20 2014-08-14    Visit Number 12    Date for PT Re-Evaluation 09/13/20    Authorization Type [employer]    PT Start Time 0930    PT Stop Time 1015    PT Time Calculation (min) 45 min    Activity Tolerance Patient tolerated treatment well           Past Medical History:  Diagnosis Date  . Allergy   . Anxiety   . Depression   . Gastroparesis   . Heart murmur   . Migraine headache   . Mitral valve prolapse   . SVT (supraventricular tachycardia) (HCC)     No past surgical history on file.  There were no vitals filed for this visit.   Subjective Assessment - 09/06/20 0930    Subjective Mostly a painfree weekend.  I was going to ask for DN today but I don't think I need it. I'm not sure I want to do aquatic PT (non swimmer).    Pertinent History supraspinatus tendinosis; mild A/C arthritis;  history of LBP;  anxiety,depression, migraines;  previous pt of BF [name], [place])    Patient Stated Goals get back to lifting 50#; be more flexible; be in the most optimal health a 60 year old can be    Currently in Pain? No/denies    Pain Score 0-No pain                             [facility] Adult PT Treatment/Exercise - 09/06/20 0001      Neck Exercises: Machines for Strengthening   UBE (Upper Arm Bike) 4 min while discussing progress/status      Shoulder Exercises: Prone   Other Prone Exercises kneeling on edge of mat bent rows 5# 10x right/left      Shoulder Exercises: Standing   Extension Both;Right;Left;20 reps;Strengthening    Theraband Level (Shoulder Extension) Level 3 (Green)    Row  Strengthening;Both;20 reps    Theraband Level (Shoulder Row) Level 3 (Green)    Other Standing Exercises 10# partial dead lifts 10x      Shoulder Exercises: ROM/Strengthening   Pushups 20 reps    Pushups Limitations on counter    Other ROM/Strengthening Exercises pec stretch in doorway 20-30 sec hold 3x                  PT Education - 09/06/20 08/13/13    Education Details dead lifts modified 10x 10#    Person(s) Educated Patient    Methods Explanation;Demonstration;Handout    Comprehension Returned demonstration;Verbalized understanding            PT Short Term Goals - 09/02/20 0955      PT SHORT TERM GOAL #1   Title Pt will be independent with his initial HEP to promote healing and pain reduction    Status Achieved      PT SHORT TERM GOAL #2   Title The patient will report a 40% improvement in back pain with sitting, sleeping and bending    Status Achieved      PT SHORT TERM GOAL #3  Title Lumbar flexion improved to 40, extension to 15 degrees needed for personal care, assisting his mother and usual ADLS    Status Achieved             PT Long Term Goals - 09/02/20 0955      PT LONG TERM GOAL #1   Title Pt will be independent with his advanced HEP to improve ROM and  strength  with daily activity after d/c from PT.    Time 8    Period Weeks    Status On-going      PT LONG TERM GOAL #2   Title Pt will report  at least 60% improvement in back pain with sitting, sleeping and bending    Time 8    Period Weeks    Status On-going      PT LONG TERM GOAL #3   Title The patient will have grossly 4+/5 lumbar/pelvic/hip strength  needed for lifting/carrying    Time 8    Period Weeks    Status On-going      PT LONG TERM GOAL #4   Title FOTO functional outcome score improved from 42% to 59%    Status Achieved                 Plan - 09/06/20 0935    Clinical Impression Statement Treatment focus on establishing a HEP and promoting confidence in  independent performance.  He is able to perform 5 resisted band ex's and variations for added core activation without pain but with quick muscular fatigue.  Verbal cues for hip hinge technique with bent rows and dead lifts.  Will review and discuss progressions of additional ex's next visit as well as further objective retests for possible discharge.    Comorbidities chronic history spinal pain, anxiety, depression, migraines    Examination-Participation Restrictions Interpersonal Relationship;Cleaning;Community Activity;Driving    Rehab Potential Good    PT Frequency 2x / week    PT Duration 8 weeks    PT Treatment/Interventions ADLs/Self Care Home Management;Electrical Stimulation;Cryotherapy;Aquatic Therapy;Moist Heat;Traction;Ultrasound;Neuromuscular re-education;Therapeutic exercise;Therapeutic activities;Patient/family education;Manual techniques;Dry needling;Taping;Spinal Manipulations;Iontophoresis 4mg /ml Dexamethasone    PT Next Visit Plan review remainder of HEP  to promote independence;  check remaining goals; ROM/MMt    PT Home Exercise Plan [email or web address]           Patient will benefit from skilled therapeutic intervention in order to improve the following deficits and impairments:  Decreased range of motion,Pain,Impaired perceived functional ability,Decreased strength,Postural dysfunction,Improper body mechanics,Increased muscle spasms  Visit Diagnosis: Other muscle spasm  Muscle weakness (generalized)  Right shoulder pain, unspecified chronicity     Problem List Patient Active Problem List   Diagnosis Date Noted  . Arthritis of right acromioclavicular joint 02/18/2020  . Right rotator cuff tear 12/18/2019  . Degenerative cervical disc 05/07/2019  . Left lumbar radiculopathy 10/10/2017  . Migraine without aura and without status migrainosus, not intractable 06/22/2017  . Vitamin D deficiency 05/28/2017  . Scrotal pain 05/28/2017  . Paroxysmal SVT (supraventricular  tachycardia) ([place]) 05/28/2017  . Mild left inguinal hernia 11/23/2016  . Chronic pelvic pain in male 09/28/2016  . Abdominal pain, left lower quadrant 09/28/2016  . Isolated proteinuria without specific morphologic lesion 06/27/2016  . Left testicular pain 06/27/2016  . Acute left flank pain 06/27/2016  . Epididymitis, left 06/05/2016  . Pelvic floor dysfunction 04/01/2016  . GERD (gastroesophageal reflux disease) 10/13/2015  . Gastroparesis 10/13/2015  . Intractable chronic migraine without aura and without status migrainosus 12/15/2013  .  Hyperlipidemia 04/04/2012  . Mitral valve disease 07/15/2009  . IRRITABLE BOWEL SYNDROME 07/15/2009  . Palpitations 06/28/2009   Ruben Im, PT 09/06/20 8:28 PM Phone: [phone] Fax: [phone] Alvera Singh 09/06/2020, 8:28 PM  [facility] [address] [place], [name], [address] Phone: [phone]   Fax:  [phone]  Name: William [name] MRN: [id number] Date of Birth: August 14, 1960

## 2020-09-06 NOTE — Patient Instructions (Signed)
Access Code: [email or web address] URL: [email or web address] Date: 09/06/2020 Prepared by: Ruben Im  Exercises Standing Bilateral Low Shoulder Row with Anchored Resistance - 2 x daily - 7 x weekly - 2 sets - 10 reps Single Arm Shoulder Extension with Anchored Resistance - 2 x daily - 7 x weekly - 2 sets - 10 reps Standing Diagonal Shoulder Extension with Anchored Resistance - 1 x daily - 7 x weekly - 1 sets - 10 reps Doorway Pec Stretch at 90 Degrees Abduction - 1 x daily - 7 x weekly - 3 reps - 1 sets - 30-60 seconds hold Prone Shoulder Extension - Single Arm - 1 x daily - 7 x weekly - 1 sets - 10 reps Standing Shoulder Horizontal Abduction with Resistance - 1 x daily - 7 x weekly - 3 sets - 10 reps Standing Shoulder Internal Rotation with Anchored Resistance - 1 x daily - 7 x weekly - 2 sets - 10 reps Shoulder External Rotation with Anchored Resistance - 1 x daily - 7 x weekly - 2 sets - 10 reps Bent Over Single Arm Shoulder Row with Dumbbell - 1 x daily - 7 x weekly - 2 sets - 10 reps Single Arm Bent Over Shoulder Horizontal Abduction with Dumbbell - Palm Down - 1 x daily - 7 x weekly - 2 sets - 10 reps Single Arm Bent Over Shoulder Scaption with Dumbbell - 1 x daily - 7 x weekly - 2 sets - 10 reps Supine Static Chest Stretch on Foam Roll - 1 x daily - 7 x weekly - 3 sets - 10 reps Sidelying Open Book Thoracic Lumbar Rotation and Extension - 1 x daily - 7 x weekly - 1 sets - 10 reps Seated Thoracic Lumbar Extension with Pectoralis Stretch - 1 x daily - 7 x weekly - 1 sets - 10 reps Seated Assisted Cervical Rotation with Towel - 1 x daily - 7 x weekly - 1 sets - 10 reps Wall Push Up - 2 x daily - 7 x weekly - 2 sets - 10 reps Neck Mobilization with Foam Roller - 1 x daily - 7 x weekly - 10 reps Half Deadlift with Kettlebell - 1 x daily - 7 x weekly - 2 sets - 10 reps  Patient Education [place] Patient Instructions

## 2020-09-08 ENCOUNTER — Encounter: Payer: Self-pay | Admitting: Family Medicine

## 2020-09-13 ENCOUNTER — Ambulatory Visit: Payer: PPO | Admitting: Physical Therapy

## 2020-09-13 ENCOUNTER — Other Ambulatory Visit: Payer: Self-pay

## 2020-09-13 DIAGNOSIS — M25511 Pain in right shoulder: Secondary | ICD-10-CM

## 2020-09-13 DIAGNOSIS — G8929 Other chronic pain: Secondary | ICD-10-CM

## 2020-09-13 DIAGNOSIS — M6281 Muscle weakness (generalized): Secondary | ICD-10-CM

## 2020-09-13 DIAGNOSIS — M62838 Other muscle spasm: Secondary | ICD-10-CM | POA: Diagnosis not present

## 2020-09-13 DIAGNOSIS — M5442 Lumbago with sciatica, left side: Secondary | ICD-10-CM

## 2020-09-13 NOTE — Therapy (Signed)
[facility] [address] [place], [name], [address] Phone: [phone]   Fax:  [phone]  Physical Therapy Treatment/Recertification  Patient Details  Name: William [name] MRN: [id number] Date of Birth: [birth year] Referring Provider (PT): Dr. [name]   Encounter Date: 09/13/2020   PT End of Session - 09/13/20 1147    Visit Number 13    Date for PT Re-Evaluation 10/25/20    Authorization Type [employer]    PT Start Time 0930    PT Stop Time 1015    PT Time Calculation (min) 45 min    Activity Tolerance Patient tolerated treatment well           Past Medical History:  Diagnosis Date  . Allergy   . Anxiety   . Depression   . Gastroparesis   . Heart murmur   . Migraine headache   . Mitral valve prolapse   . SVT (supraventricular tachycardia) (HCC)     No past surgical history on file.  There were no vitals filed for this visit.   Subjective Assessment - 09/13/20 1135    Subjective I'm still not sure about doing water therapy.  Doing OK today.  I'm not sure what to do with my foam roll.    Pertinent History supraspinatus tendinosis; mild A/C arthritis;  history of LBP;  anxiety,depression, migraines;  previous pt of BF [name], [place])    Patient Stated Goals get back to lifting 50#; be more flexible; be in the most optimal health a 60 year old can be    Currently in Pain? No/denies    Pain Score 0-No pain              [facility] PT Assessment - 09/13/20 0001      Observation/Other Assessments   Focus on Therapeutic Outcomes (FOTO)  69%      AROM   AROM Assessment Site --   full shoulder ROM   Lumbar Flexion 60    Lumbar Extension 28    Lumbar - Right Side Bend 28    Lumbar - Left Side Bend 26      Strength   Overall Strength Comments bil shoulder strength 4/5    Lumbar Flexion 4/5    Lumbar Extension 4/5                         [facility] Adult PT Treatment/Exercise  - 09/13/20 0001      Neck Exercises: Supine   Other Supine Exercise foam roll rotations and nods, circles      Lumbar Exercises: Stretches   Other Lumbar Stretch Exercise seated foam roll thoracic extensions 10x      Lumbar Exercises: Standing   Other Standing Lumbar Exercises dead lifts to 8 inch box 3x 10 10# kettle bell      Lumbar Exercises: Supine   Other Supine Lumbar Exercises lying vertically on foam roll palms up; UE elevation 10x      Lumbar Exercises: Sidelying   Other Sidelying Lumbar Exercises open books with LE on foam roll 10x right/left      Shoulder Exercises: Prone   Other Prone Exercises kneeling on edge of mat bent rows 5# 10x right/left; 2# horizontal abduction and scaption 10x each      Shoulder Exercises: ROM/Strengthening   UBE (Upper Arm Bike) 4 min both directions  PT Education - 09/13/20 1146    Education Details cues for dead lift and weight amounts for shoulder ex    Person(s) Educated Patient    Methods Explanation;Demonstration;Handout    Comprehension Returned demonstration;Verbalized understanding            PT Short Term Goals - 09/13/20 1831      PT SHORT TERM GOAL #1   Title Pt will be independent with his initial HEP to promote healing and pain reduction    Status Achieved      PT SHORT TERM GOAL #2   Title The patient will report a 40% improvement in back pain with sitting, sleeping and bending    Status Achieved      PT SHORT TERM GOAL #3   Title Lumbar flexion improved to 40, extension to 15 degrees needed for personal care, assisting his mother and usual ADLS    Status Achieved             PT Long Term Goals - 09/13/20 1831      PT LONG TERM GOAL #1   Title Pt will be independent with his advanced HEP to improve ROM and  strength  with daily activity after d/c from PT.    Status Partially Met      PT LONG TERM GOAL #2   Title Pt will report  at least 60% improvement in back pain with sitting,  sleeping and bending    Status Achieved      PT LONG TERM GOAL #3   Title The patient will have grossly 4+/5 lumbar/pelvic/hip strength  needed for lifting/carrying    Status Partially Met      PT LONG TERM GOAL #4   Title FOTO functional outcome score improved from 42% to 59%    Status Achieved                 Plan - 09/13/20 0933    Clinical Impression Statement The patient has progressed well with pain reduction, ROM, strength, and return to function.  We have focused on developing a progressive HEP for further improvements over time. Dr. Brigitte Pulse suggested aquatic exercise to him but he is not sure he wants to follow through with this since he is a non swimmer.   He is scheduled for an aquatic PT visit in 2 weeks but wants to think about it.  He has met the majority of rehab goals and anticipate discharge from land based PT and 1-3 aquatic PT if desired.    Personal Factors and Comorbidities Comorbidity 1;Comorbidity 2;Comorbidity 3+;Past/Current Experience    Comorbidities chronic history spinal pain, anxiety, depression, migraines    Examination-Participation Restrictions Interpersonal Relationship;Cleaning;Community Activity;Driving    Stability/Clinical Decision Making Stable/Uncomplicated    Clinical Decision Making Low    Rehab Potential Good    PT Frequency 1x / week    PT Duration 6 weeks    PT Treatment/Interventions ADLs/Self Care Home Management;Electrical Stimulation;Cryotherapy;Aquatic Therapy;Moist Heat;Traction;Ultrasound;Neuromuscular re-education;Therapeutic exercise;Therapeutic activities;Patient/family education;Manual techniques;Dry needling;Taping;Spinal Manipulations;Iontophoresis 9m/ml Dexamethasone    PT Next Visit Plan 1-3 aquatic visits    PT Home Exercise Plan [email or web address]          Patient will benefit from skilled therapeutic intervention in order to improve the following deficits and impairments:  Decreased range of motion,Pain,Impaired perceived  functional ability,Decreased strength,Postural dysfunction,Improper body mechanics,Increased muscle spasms  Visit Diagnosis: Other muscle spasm - Plan: PT plan of care cert/re-cert  Muscle weakness (generalized) - Plan: PT plan of care  cert/re-cert  Right shoulder pain, unspecified chronicity - Plan: PT plan of care cert/re-cert  Chronic left-sided low back pain with left-sided sciatica - Plan: PT plan of care cert/re-cert     Problem List Patient Active Problem List   Diagnosis Date Noted  . Arthritis of right acromioclavicular joint 02/18/2020  . Right rotator cuff tear 12/18/2019  . Degenerative cervical disc 05/07/2019  . Left lumbar radiculopathy 10/10/2017  . Migraine without aura and without status migrainosus, not intractable 06/22/2017  . Vitamin D deficiency 05/28/2017  . Scrotal pain 05/28/2017  . Paroxysmal SVT (supraventricular tachycardia) ([place]) 05/28/2017  . Mild left inguinal hernia 11/23/2016  . Chronic pelvic pain in male 09/28/2016  . Abdominal pain, left lower quadrant 09/28/2016  . Isolated proteinuria without specific morphologic lesion 06/27/2016  . Left testicular pain 06/27/2016  . Acute left flank pain 06/27/2016  . Epididymitis, left 06/05/2016  . Pelvic floor dysfunction 04/01/2016  . GERD (gastroesophageal reflux disease) 10/13/2015  . Gastroparesis 10/13/2015  . Intractable chronic migraine without aura and without status migrainosus 12/15/2013  . Hyperlipidemia 04/04/2012  . Mitral valve disease 07/15/2009  . IRRITABLE BOWEL SYNDROME 07/15/2009  . Palpitations 06/28/2009   Ruben Im, PT 09/13/20 6:34 PM Phone: [phone] Fax: [phone] Alvera Singh 09/13/2020, 6:33 PM  [facility] [address] [place], [name], [address] Phone: [phone]   Fax:  [phone]  Name: William [name] MRN: [id number] Date of Birth: [birth year]

## 2020-09-13 NOTE — Patient Instructions (Signed)
Access Code: [email or web address] URL: [email or web address] Date: 09/13/2020 Prepared by: Ruben Im  Exercises Standing Bilateral Low Shoulder Row with Anchored Resistance - 2 x daily - 7 x weekly - 2 sets - 10 reps Single Arm Shoulder Extension with Anchored Resistance - 2 x daily - 7 x weekly - 2 sets - 10 reps Standing Diagonal Shoulder Extension with Anchored Resistance - 1 x daily - 7 x weekly - 1 sets - 10 reps Doorway Pec Stretch at 90 Degrees Abduction - 1 x daily - 7 x weekly - 3 reps - 1 sets - 30-60 seconds hold Prone Shoulder Extension - Single Arm - 1 x daily - 7 x weekly - 1 sets - 10 reps Standing Shoulder Horizontal Abduction with Resistance - 1 x daily - 7 x weekly - 3 sets - 10 reps Standing Shoulder Internal Rotation with Anchored Resistance - 1 x daily - 7 x weekly - 2 sets - 10 reps Shoulder External Rotation with Anchored Resistance - 1 x daily - 7 x weekly - 2 sets - 10 reps Bent Over Single Arm Shoulder Row with Dumbbell - 1 x daily - 7 x weekly - 2 sets - 10 reps Single Arm Bent Over Shoulder Horizontal Abduction with Dumbbell - Palm Down - 1 x daily - 7 x weekly - 2 sets - 10 reps Single Arm Bent Over Shoulder Scaption with Dumbbell - 1 x daily - 7 x weekly - 2 sets - 10 reps Neck Mobilization with Foam Roller - 1 x daily - 7 x weekly - 10 reps Supine Static Chest Stretch on Foam Roll - 1 x daily - 7 x weekly - 1 sets - 10 reps Sidelying Open Book Thoracic Lumbar Rotation and Extension - 1 x daily - 7 x weekly - 1 sets - 10 reps Seated Thoracic Lumbar Extension with Pectoralis Stretch - 1 x daily - 7 x weekly - 1 sets - 10 reps Seated Assisted Cervical Rotation with Towel - 1 x daily - 7 x weekly - 1 sets - 10 reps Half Deadlift with Kettlebell - 1 x daily - 7 x weekly - 2 sets - 10 reps  Patient Education [place] Patient Instructions

## 2020-09-15 ENCOUNTER — Encounter: Payer: PPO | Admitting: Physical Therapy

## 2020-09-22 ENCOUNTER — Ambulatory Visit: Payer: PPO | Admitting: Physical Therapy

## 2020-09-22 ENCOUNTER — Other Ambulatory Visit: Payer: Self-pay

## 2020-09-22 DIAGNOSIS — M6281 Muscle weakness (generalized): Secondary | ICD-10-CM

## 2020-09-22 DIAGNOSIS — M25511 Pain in right shoulder: Secondary | ICD-10-CM

## 2020-09-22 DIAGNOSIS — M62838 Other muscle spasm: Secondary | ICD-10-CM

## 2020-09-22 DIAGNOSIS — G8929 Other chronic pain: Secondary | ICD-10-CM

## 2020-09-22 NOTE — Therapy (Signed)
[facility] [address], [place], [name], [address] Phone: [phone]   Fax:  [phone]  Physical Therapy Treatment  Patient Details  Name: William [name] MRN: [id number] Date of Birth: April 29, 1961 Referring Provider (PT): Dr. [name]   Encounter Date: 09/22/2020   PT End of Session - 09/22/20 1135    Visit Number 14    Date for PT Re-Evaluation 10/25/20    Authorization Type [employer]    PT Start Time 1020    PT Stop Time 1105    PT Time Calculation (min) 45 min    Activity Tolerance Patient tolerated treatment well           Past Medical History:  Diagnosis Date  . Allergy   . Anxiety   . Depression   . Gastroparesis   . Heart murmur   . Migraine headache   . Mitral valve prolapse   . SVT (supraventricular tachycardia) (HCC)     No past surgical history on file.  There were no vitals filed for this visit.   Subjective Assessment - 09/22/20 1021    Subjective My favorite exercise is lifting the kettlebell.  Only the 10#, not the 15# or 20#.    Pertinent History supraspinatus tendinosis; mild A/C arthritis;  history of LBP;  anxiety,depression, migraines;  previous pt of BF [name], [place])    Patient Stated Goals get back to lifting 50#; be more flexible; be in the most optimal health a 60 year old can be    Currently in Pain? No/denies    Pain Score 0-No pain                             [facility] Adult PT Treatment/Exercise - 09/22/20 0001      Neck Exercises: Machines for Strengthening   UBE (Upper Arm Bike) 4 min while discussing progress/status      Lumbar Exercises: Standing   Other Standing Lumbar Exercises dead lifts to 8 inch box 3x 10 10# kettle bell    Other Standing Lumbar Exercises 15# kettlebell swings 10x      Knee/Hip Exercises: Standing   Forward Step Up Right;Left;10 reps    Forward Step Up Limitations holding 10# kettlebell    Other Standing  Knee Exercises single leg dead lifts with foot on wall; toe touch 5x each without kettlebell right/left    Other Standing Knee Exercises sit to stand holding 10# kettlebell 10x      Shoulder Exercises: Standing   Other Standing Exercises 10# snatch and press overhead 10x right/left                    PT Short Term Goals - 09/13/20 1831      PT SHORT TERM GOAL #1   Title Pt will be independent with his initial HEP to promote healing and pain reduction    Status Achieved      PT SHORT TERM GOAL #2   Title The patient will report a 40% improvement in back pain with sitting, sleeping and bending    Status Achieved      PT SHORT TERM GOAL #3   Title Lumbar flexion improved to 40, extension to 15 degrees needed for personal care, assisting his mother and usual ADLS    Status Achieved             PT Long Term Goals - 09/13/20 1831  PT LONG TERM GOAL #1   Title Pt will be independent with his advanced HEP to improve ROM and  strength  with daily activity after d/c from PT.    Status Partially Met      PT LONG TERM GOAL #2   Title Pt will report  at least 60% improvement in back pain with sitting, sleeping and bending    Status Achieved      PT LONG TERM GOAL #3   Title The patient will have grossly 4+/5 lumbar/pelvic/hip strength  needed for lifting/carrying    Status Partially Met      PT LONG TERM GOAL #4   Title FOTO functional outcome score improved from 42% to 59%    Status Achieved                 Plan - 09/22/20 1039    Clinical Impression Statement Patient states, "I feel like these last 2 sessions have been really productive."  Good carryover with partial dead lifts with 10# kettlebell.  He requests a further progression of exercises using the kettlebells since he really likes working with them.  He is able to perform loaded sit to stands, step ups, farmer's carries and snatch and presses with excellent technique.  Single leg dead lifts are much  more difficult and verbal cues for hip hinge rather than excessive knee flexion.  He would benefit from 1-2 more visits over the next 4-5 weeks to solidify his resistive strengthening program before discharging to an independent program.  He does not wish to pursue aquatic PT at this time but may consider at a later date.  No LBP or shoulder pain during treatment session.    Personal Factors and Comorbidities Comorbidity 1;Comorbidity 2;Comorbidity 3+;Past/Current Experience    Examination-Activity Limitations Lift;Caring for Others;Locomotion Level;Sit;Sleep;Stand    Rehab Potential Good    PT Frequency 1x / week    PT Treatment/Interventions ADLs/Self Care Home Management;Electrical Stimulation;Cryotherapy;Aquatic Therapy;Moist Heat;Traction;Ultrasound;Neuromuscular re-education;Therapeutic exercise;Therapeutic activities;Patient/family education;Manual techniques;Dry needling;Taping;Spinal Manipulations;Iontophoresis 70m/ml Dexamethasone    PT Next Visit Plan 1-2 follow ups to review/progress kettlebell ex's    PT Home Exercise Plan [id number]          Patient will benefit from skilled therapeutic intervention in order to improve the following deficits and impairments:  Decreased range of motion,Pain,Impaired perceived functional ability,Decreased strength,Postural dysfunction,Improper body mechanics,Increased muscle spasms  Visit Diagnosis: Other muscle spasm  Muscle weakness (generalized)  Right shoulder pain, unspecified chronicity  Chronic left-sided low back pain with left-sided sciatica     Problem List Patient Active Problem List   Diagnosis Date Noted  . Arthritis of right acromioclavicular joint 02/18/2020  . Right rotator cuff tear 12/18/2019  . Degenerative cervical disc 05/07/2019  . Left lumbar radiculopathy 10/10/2017  . Migraine without aura and without status migrainosus, not intractable 06/22/2017  . Vitamin D deficiency 05/28/2017  . Scrotal pain 05/28/2017  .  Paroxysmal SVT (supraventricular tachycardia) ([facility] 05/28/2017  . Mild left inguinal hernia 11/23/2016  . Chronic pelvic pain in male 09/28/2016  . Abdominal pain, left lower quadrant 09/28/2016  . Isolated proteinuria without specific morphologic lesion 06/27/2016  . Left testicular pain 06/27/2016  . Acute left flank pain 06/27/2016  . Epididymitis, left 06/05/2016  . Pelvic floor dysfunction 04/01/2016  . GERD (gastroesophageal reflux disease) 10/13/2015  . Gastroparesis 10/13/2015  . Intractable chronic migraine without aura and without status migrainosus 12/15/2013  . Hyperlipidemia 04/04/2012  . Mitral valve disease 07/15/2009  . IRRITABLE BOWEL  SYNDROME 07/15/2009  . Palpitations 06/28/2009   Ruben Im, PT 09/22/20 11:37 AM Phone: [phone] Fax: [phone] Alvera Singh 09/22/2020, 11:36 AM  [facility] [address], [place], [name], [address] Phone: [phone]   Fax:  [phone]  Name: William [name] MRN: [id number] Date of Birth: Sep 22, 1960

## 2020-09-22 NOTE — Patient Instructions (Signed)
Access Code: [email or web address] URL: [email or web address] Date: 09/22/2020 Prepared by: Ruben Im  Exercises Standing Bilateral Low Shoulder Row with Anchored Resistance - 2 x daily - 7 x weekly - 2 sets - 10 reps Single Arm Shoulder Extension with Anchored Resistance - 2 x daily - 7 x weekly - 2 sets - 10 reps Standing Diagonal Shoulder Extension with Anchored Resistance - 1 x daily - 7 x weekly - 1 sets - 10 reps Doorway Pec Stretch at 90 Degrees Abduction - 1 x daily - 7 x weekly - 3 reps - 1 sets - 30-60 seconds hold Prone Shoulder Extension - Single Arm - 1 x daily - 7 x weekly - 1 sets - 10 reps Standing Shoulder Horizontal Abduction with Resistance - 1 x daily - 7 x weekly - 3 sets - 10 reps Standing Shoulder Internal Rotation with Anchored Resistance - 1 x daily - 7 x weekly - 2 sets - 10 reps Shoulder External Rotation with Anchored Resistance - 1 x daily - 7 x weekly - 2 sets - 10 reps Bent Over Single Arm Shoulder Row with Dumbbell - 1 x daily - 7 x weekly - 2 sets - 10 reps Single Arm Bent Over Shoulder Horizontal Abduction with Dumbbell - Palm Down - 1 x daily - 7 x weekly - 2 sets - 10 reps Single Arm Bent Over Shoulder Scaption with Dumbbell - 1 x daily - 7 x weekly - 2 sets - 10 reps Neck Mobilization with Foam Roller - 1 x daily - 7 x weekly - 10 reps Supine Static Chest Stretch on Foam Roll - 1 x daily - 7 x weekly - 1 sets - 10 reps Sidelying Open Book Thoracic Lumbar Rotation and Extension - 1 x daily - 7 x weekly - 1 sets - 10 reps Seated Thoracic Lumbar Extension with Pectoralis Stretch - 1 x daily - 7 x weekly - 1 sets - 10 reps Seated Assisted Cervical Rotation with Towel - 1 x daily - 7 x weekly - 1 sets - 10 reps Half Deadlift with Kettlebell - 1 x daily - 7 x weekly - 2 sets - 10 reps SNATCH AND PRESS OVERHEAD - 1 x daily - 7 x weekly - 1 sets - 10 reps Single Leg Deadlift with Kettlebell - 1 x daily - 7 x weekly - 1 sets - 10 reps Step Up - 1 x daily - 7  x weekly - 2 sets - 10 reps Farmer's Carry with Kettlebells - 1 x daily - 7 x weekly - 2 sets - 10 reps Kettlebell Swing - 1 x daily - 7 x weekly - 2 sets - 10 reps  Patient Education [place] Patient Instructions

## 2020-09-23 ENCOUNTER — Ambulatory Visit: Payer: PPO | Admitting: Physical Therapy

## 2020-09-28 DIAGNOSIS — E559 Vitamin D deficiency, unspecified: Secondary | ICD-10-CM | POA: Diagnosis not present

## 2020-09-28 DIAGNOSIS — I471 Supraventricular tachycardia: Secondary | ICD-10-CM | POA: Diagnosis not present

## 2020-09-28 DIAGNOSIS — G43719 Chronic migraine without aura, intractable, without status migrainosus: Secondary | ICD-10-CM | POA: Diagnosis not present

## 2020-09-28 DIAGNOSIS — N182 Chronic kidney disease, stage 2 (mild): Secondary | ICD-10-CM | POA: Diagnosis not present

## 2020-10-03 DIAGNOSIS — J302 Other seasonal allergic rhinitis: Secondary | ICD-10-CM | POA: Diagnosis not present

## 2020-10-03 DIAGNOSIS — J343 Hypertrophy of nasal turbinates: Secondary | ICD-10-CM | POA: Diagnosis not present

## 2020-10-03 DIAGNOSIS — J0191 Acute recurrent sinusitis, unspecified: Secondary | ICD-10-CM | POA: Diagnosis not present

## 2020-10-14 ENCOUNTER — Ambulatory Visit: Payer: PPO | Attending: Family Medicine | Admitting: Physical Therapy

## 2020-10-14 ENCOUNTER — Other Ambulatory Visit: Payer: Self-pay

## 2020-10-14 DIAGNOSIS — M6281 Muscle weakness (generalized): Secondary | ICD-10-CM | POA: Insufficient documentation

## 2020-10-14 DIAGNOSIS — M62838 Other muscle spasm: Secondary | ICD-10-CM | POA: Diagnosis not present

## 2020-10-14 DIAGNOSIS — M25511 Pain in right shoulder: Secondary | ICD-10-CM | POA: Diagnosis not present

## 2020-10-14 DIAGNOSIS — G8929 Other chronic pain: Secondary | ICD-10-CM | POA: Diagnosis not present

## 2020-10-14 DIAGNOSIS — M5442 Lumbago with sciatica, left side: Secondary | ICD-10-CM | POA: Diagnosis not present

## 2020-10-14 NOTE — Therapy (Addendum)
[facility] [address] [place], [name], [address] Phone: [phone]   Fax:  [phone]  Physical Therapy Treatment/Discharge Summary   Patient Details  Name: William [name] MRN: [id number] Date of Birth: 19-Apr-1961 Referring Provider (PT): Dr. [name]   Encounter Date: 10/14/2020   PT End of Session - 10/14/20 1243     Visit Number 15    Date for PT Re-Evaluation 10/25/20    Authorization Type [employer]    PT Start Time 0937    PT Stop Time 1015    PT Time Calculation (min) 38 min    Activity Tolerance Patient tolerated treatment well             Past Medical History:  Diagnosis Date   Allergy    Anxiety    Depression    Gastroparesis    Heart murmur    Migraine headache    Mitral valve prolapse    SVT (supraventricular tachycardia) ([place])     No past surgical history on file.  There were no vitals filed for this visit.   Subjective Assessment - 10/14/20 0939     Subjective I don't know what I did, but my whole left side shoulder down to my foot.  I haven't done much b/c I was scared I would make it worse.    Pertinent History supraspinatus tendinosis; mild A/C arthritis;  history of LBP;  anxiety,depression, migraines;  previous pt of BF [name], [place])    Currently in Pain? Yes    Pain Score 3     Pain Location Shoulder   lower leg                              [facility] Adult PT Treatment/Exercise - 10/14/20 0001       Self-Care   ADL's discussed graded movement/exercise for analgesia      Therapeutic Activites    Lifting Discussed carrying load close to body vs. on shoulder      Lumbar Exercises: Aerobic   UBE (Upper Arm Bike) L 1 4 2x2    Nustep L1 5 min      Lumbar Exercises: Standing   Row Power tower;20 reps    Row Limitations cable pulley 20#    Other Standing Lumbar Exercises discussed managment      Lumbar Exercises: Prone   Other Prone  Lumbar Exercises press ups 2x8   relieves LE symptoms     Manual Therapy   Manual therapy comments manual overpressure with prone press ups    Joint Mobilization L1-L5 PA grade 3 mobs 20 sec each level    Soft tissue mobilization lumbar paraspinals                      PT Short Term Goals - 09/13/20 1831       PT SHORT TERM GOAL #1   Title Pt will be independent with his initial HEP to promote healing and pain reduction    Status Achieved      PT SHORT TERM GOAL #2   Title The patient will report a 40% improvement in back pain with sitting, sleeping and bending    Status Achieved      PT SHORT TERM GOAL #3   Title Lumbar flexion improved to 40, extension to 15 degrees needed for personal care, assisting his mother and usual ADLS    Status  Achieved               PT Long Term Goals - 09/13/20 1831       PT LONG TERM GOAL #1   Title Pt will be independent with his advanced HEP to improve ROM and  strength  with daily activity after d/c from PT.    Status Partially Met      PT LONG TERM GOAL #2   Title Pt will report  at least 60% improvement in back pain with sitting, sleeping and bending    Status Achieved      PT LONG TERM GOAL #3   Title The patient will have grossly 4+/5 lumbar/pelvic/hip strength  needed for lifting/carrying    Status Partially Met      PT LONG TERM GOAL #4   Title FOTO functional outcome score improved from 42% to 59%    Status Achieved                   Plan - 10/14/20 1243     Clinical Impression Statement The patient reports new onset of pain in his other shoulder and LE symptoms for no clear reason (although he wonders if it could be from carrying a load on his shoulder.) Peripheral symptoms resolved with prone press ups and we discussed frequent performance over the weekend.  Discussed continued movement, exercise and gradual return to kettlebell strength training.  Therapist monitoring response to all interventions  and modifying treatment accordingly.    Personal Factors and Comorbidities Comorbidity 1;Comorbidity 2;Comorbidity 3+;Past/Current Experience    Comorbidities chronic history spinal pain, anxiety, depression, migraines    Examination-Activity Limitations Lift;Caring for Others;Locomotion Level;Sit;Sleep;Stand    Examination-Participation Restrictions Interpersonal Relationship;Cleaning;Community Activity;Driving    Rehab Potential Good    PT Frequency 1x / week    PT Duration 6 weeks    PT Treatment/Interventions ADLs/Self Care Home Management;Electrical Stimulation;Cryotherapy;Aquatic Therapy;Moist Heat;Traction;Ultrasound;Neuromuscular re-education;Therapeutic exercise;Therapeutic activities;Patient/family education;Manual techniques;Dry needling;Taping;Spinal Manipulations;Iontophoresis 29m/ml Dexamethasone    PT Next Visit Plan follow up in 1 week; review carrying load close to body;  return to kettlebell lifts as tolerated;  ERO vs. discharge    PT Home Exercise Plan [id number]             Patient will benefit from skilled therapeutic intervention in order to improve the following deficits and impairments:  Decreased range of motion, Pain, Impaired perceived functional ability, Decreased strength, Postural dysfunction, Improper body mechanics, Increased muscle spasms  Visit Diagnosis: Other muscle spasm  Muscle weakness (generalized)  Right shoulder pain, unspecified chronicity  Chronic left-sided low back pain with left-sided sciatica   PHYSICAL THERAPY DISCHARGE SUMMARY  Visits from Start of Care: 15  Current functional level related to goals / functional outcomes: Last visit on 6/17 and plan of care ended on 6/28.     Remaining deficits: As above   Education / Equipment: HEP    Patient agrees to discharge. Patient goals were partially met. Patient is being discharged due to not returning since the last visit.   Problem List Patient Active Problem List   Diagnosis  Date Noted   Arthritis of right acromioclavicular joint 02/18/2020   Right rotator cuff tear 12/18/2019   Degenerative cervical disc 05/07/2019   Left lumbar radiculopathy 10/10/2017   Migraine without aura and without status migrainosus, not intractable 06/22/2017   Vitamin D deficiency 05/28/2017   Scrotal pain 05/28/2017   Paroxysmal SVT (supraventricular tachycardia) ([facility] 05/28/2017   Mild left inguinal hernia 11/23/2016  Chronic pelvic pain in male 09/28/2016   Abdominal pain, left lower quadrant 09/28/2016   Isolated proteinuria without specific morphologic lesion 06/27/2016   Left testicular pain 06/27/2016   Acute left flank pain 06/27/2016   Epididymitis, left 06/05/2016   Pelvic floor dysfunction 04/01/2016   GERD (gastroesophageal reflux disease) 10/13/2015   Gastroparesis 10/13/2015   Intractable chronic migraine without aura and without status migrainosus 12/15/2013   Hyperlipidemia 04/04/2012   Mitral valve disease 07/15/2009   IRRITABLE BOWEL SYNDROME 07/15/2009   Palpitations 06/28/2009   Ruben Im, PT 10/14/20 12:51 PM Phone: [phone] Fax: [phone]   Alvera Singh 10/14/2020, 12:50 PM  [facility] [address] [place], [name], [address] Phone: [phone]   Fax:  [phone]  Name: Wilman Tucker MRN: [id number] Date of Birth: 24-Dec-1960

## 2020-10-18 ENCOUNTER — Ambulatory Visit: Payer: PPO | Admitting: Family Medicine

## 2020-10-18 ENCOUNTER — Encounter: Payer: Self-pay | Admitting: Family Medicine

## 2020-10-18 ENCOUNTER — Other Ambulatory Visit: Payer: Self-pay

## 2020-10-18 ENCOUNTER — Ambulatory Visit (INDEPENDENT_AMBULATORY_CARE_PROVIDER_SITE_OTHER): Payer: PPO

## 2020-10-18 VITALS — BP 120/82 | HR 92 | Ht 70.0 in

## 2020-10-18 DIAGNOSIS — F32A Depression, unspecified: Secondary | ICD-10-CM | POA: Diagnosis not present

## 2020-10-18 DIAGNOSIS — M545 Low back pain, unspecified: Secondary | ICD-10-CM | POA: Diagnosis not present

## 2020-10-18 DIAGNOSIS — M2550 Pain in unspecified joint: Secondary | ICD-10-CM | POA: Diagnosis not present

## 2020-10-18 DIAGNOSIS — Z636 Dependent relative needing care at home: Secondary | ICD-10-CM | POA: Diagnosis not present

## 2020-10-18 DIAGNOSIS — M5416 Radiculopathy, lumbar region: Secondary | ICD-10-CM

## 2020-10-18 DIAGNOSIS — M75111 Incomplete rotator cuff tear or rupture of right shoulder, not specified as traumatic: Secondary | ICD-10-CM

## 2020-10-18 LAB — COMPREHENSIVE METABOLIC PANEL
ALT: 14 U/L (ref 0–53)
AST: 17 U/L (ref 0–37)
Albumin: 4.4 g/dL (ref 3.5–5.2)
Alkaline Phosphatase: 64 U/L (ref 39–117)
BUN: 11 mg/dL (ref 6–23)
CO2: 27 mEq/L (ref 19–32)
Calcium: 9.5 mg/dL (ref 8.4–10.5)
Chloride: 103 mEq/L (ref 96–112)
Creatinine, Ser: 1.16 mg/dL (ref 0.40–1.50)
GFR: 68.21 mL/min (ref >60.00)
Glucose, Bld: 79 mg/dL (ref 70–99)
Potassium: 4.1 mEq/L (ref 3.5–5.1)
Sodium: 138 mEq/L (ref 135–145)
Total Bilirubin: 1.3 mg/dL — ABNORMAL HIGH (ref 0.2–1.2)
Total Protein: 7.2 g/dL (ref 6.0–8.3)

## 2020-10-18 LAB — CBC WITH DIFFERENTIAL/PLATELET
Basophils Absolute: 0.1 10*3/uL (ref 0.0–0.1)
Basophils Relative: 0.8 % (ref 0.0–3.0)
Eosinophils Absolute: 0 10*3/uL (ref 0.0–0.7)
Eosinophils Relative: 0.4 % (ref 0.0–5.0)
HCT: 46.3 % (ref 39.0–52.0)
Hemoglobin: 15.4 g/dL (ref 13.0–17.0)
Lymphocytes Relative: 24.1 % (ref 12.0–46.0)
Lymphs Abs: 1.8 10*3/uL (ref 0.7–4.0)
MCHC: 33.3 g/dL (ref 30.0–36.0)
MCV: 96.8 fl (ref 78.0–100.0)
Monocytes Absolute: 0.5 10*3/uL (ref 0.1–1.0)
Monocytes Relative: 6.3 % (ref 3.0–12.0)
Neutro Abs: 5 10*3/uL (ref 1.4–7.7)
Neutrophils Relative %: 68.4 % (ref 43.0–77.0)
Platelets: 234 10*3/uL (ref 150.0–400.0)
RBC: 4.79 Mil/uL (ref 4.22–5.81)
RDW: 13.1 % (ref 11.5–15.5)
WBC: 7.3 10*3/uL (ref 4.0–10.5)

## 2020-10-18 LAB — TSH: TSH: 0.79 u[IU]/mL (ref 0.35–4.50)

## 2020-10-18 LAB — VITAMIN D 25 HYDROXY (VIT D DEFICIENCY, FRACTURES): VITD: 26.01 ng/mL — ABNORMAL LOW (ref 30.00–100.00)

## 2020-10-18 LAB — VITAMIN B12: Vitamin B-12: 184 pg/mL — ABNORMAL LOW (ref 211–911)

## 2020-10-18 NOTE — Assessment & Plan Note (Signed)
Patient does bring up caregiver burden.  Patient has been the primary caregiver for his mom for quite some time.  Has not been able to focus on himself.  Patient is willing to talk to behavioral health and referral placed today.

## 2020-10-18 NOTE — Assessment & Plan Note (Signed)
Full range of motion at this point.  Doing very well.  No significant difficulties.  Follow-up again 5 to 6 weeks

## 2020-10-18 NOTE — Patient Instructions (Addendum)
Labs today Xray today [place] will call you to schedule Aveno or Eucerin lotion 2x a day See me again in 5-6 weeks

## 2020-10-18 NOTE — Progress Notes (Signed)
[facility] [place] Phone: [phone] Subjective:   [name], am serving as a scribe for Dr. Hulan Saas.  This visit occurred during the SARS-CoV-2 public health emergency.  Safety protocols were in place, including screening questions prior to the visit, additional usage of staff PPE, and extensive cleaning of exam room while observing appropriate contact time as indicated for disinfecting solutions.    I'm seeing this patient by the request  of:  Shawnee Knapp, MD  CC: Shoulder pain follow-up, foot pain  [id number]  Isabel Freese is a 60 y.o. male coming in with complaint of low back pain and R shoulder pain. Last seen 06/15/2020. Patient has been doing PT for lower back. Patient states that he continues to have pain that radiates down L leg to the foot. Having burning on bottom of foot. Felt like he had a cut on the plantar aspect of foot and had wife look at bottom of foot last night. Has not been wearing supportive shoes. Felt like his pain greatly improved but 2 weeks ago he feels like he over did things and since then has had burning in L leg. Is lifting transport chair into the car increases his pain.  Has had a past medical history of a left lumbar radiculopathy.    Past Medical History:  Diagnosis Date   Allergy    Anxiety    Depression    Gastroparesis    Heart murmur    Migraine headache    Mitral valve prolapse    SVT (supraventricular tachycardia) (HCC)    No past surgical history on file. Social History   Socioeconomic History   Marital status: Married    Spouse name: Not on file   Number of children: 0   Years of education: Not on file   Highest education level: Bachelor's degree (e.g., BA, AB, BS)  Occupational History   Not on file  Tobacco Use   Smoking status: Never   Smokeless tobacco: Never  Vaping Use   Vaping Use: Never used  Substance and Sexual Activity   Alcohol  use: No    Alcohol/week: 0.0 standard drinks   Drug use: No   Sexual activity: Yes    Partners: Female  Other Topics Concern   Not on file  Social History Narrative   Pt is married lives with spouse in 1 story apartment he has No children   BS degree- he is right handed   Drinks soda  Ginger ale, no coffee no tea   Right handed   Social Determinants of Radio broadcast assistant Strain: Not on file  Food Insecurity: Not on file  Transportation Needs: Not on file  Physical Activity: Not on file  Stress: Not on file  Social Connections: Not on file   Allergies  Allergen Reactions   Erythromycin Anaphylaxis and Nausea And Vomiting   Nalbuphine Other (See Comments)    States loss of consciousness    Cefdinir Diarrhea, Nausea And Vomiting and Rash   Cephalexin Diarrhea and Nausea And Vomiting   Cephalosporins Diarrhea and Nausea And Vomiting   Hydrocodone-Acetaminophen Nausea And Vomiting   Ketorolac Nausea And Vomiting   Prochlorperazine Edisylate Other (See Comments)    uncontrollable shake - tremor    Restasis [Cyclosporine] Other (See Comments)    migraines   Tyloxapol Swelling   Propranolol Diarrhea   Topamax [Topiramate] Diarrhea and Nausea And Vomiting   Zetia [Ezetimibe]  Diarrhea   Doxycycline Hyclate Nausea Only and Other (See Comments)    abd pain   Iodinated Diagnostic Agents Other (See Comments)    Lightheaded, flushed feeling (explained to patient these are normal side effects of IV iodinated contrast, not allergies, but he wanted this noted in the epic; he tolerated epidural steroid injections w/ contrast w/o difficulty)   Ketoconazole Itching   Latex Itching   Neosporin [Neomycin-Bacitracin Zn-Polymyx] Rash   Oxycodone-Acetaminophen Other (See Comments)    Tylox caused constipation   Prednisone Nausea And Vomiting    Oral route, only   Family History  Problem Relation Age of Onset   Hypertension Mother    Hypertension Brother    Kidney failure  Maternal Grandmother    Prostate cancer Neg Hx    Kidney cancer Neg Hx      Current Outpatient Medications (Cardiovascular):    diltiazem (CARDIZEM CD) 180 MG 24 hr capsule, Take 1 capsule (180 mg total) by mouth daily.   diltiazem (CARDIZEM) 30 MG tablet, TAKE (1) TABLET AS NEEDED FOR PALPITATIONS  Current Outpatient Medications (Respiratory):    fluticasone (FLONASE) 50 MCG/ACT nasal spray, Place 2 sprays into both nostrils daily.  Current Outpatient Medications (Analgesics):    EMGALITY 120 MG/ML SOAJ,    Rimegepant Sulfate (NURTEC) 75 MG TBDP, Take 75 mg by mouth as needed for up to 1 dose (Maximum 1 tablet in 24 hours.).   Current Outpatient Medications (Other):    baclofen (LIORESAL) 10 MG tablet, Take 10 mg by mouth 3 (three) times daily.   COVID-19 mRNA vaccine, Pfizer, 30 MCG/0.3ML injection, INJECT AS DIRECTED   metoCLOPramide (REGLAN) 5 MG tablet, Take 5 mg by mouth as needed.   ondansetron (ZOFRAN) 4 MG tablet, Take 4 mg by mouth as needed.   Reviewed prior external information including notes and imaging from  primary care provider As well as notes that were available from care everywhere and other healthcare systems.  Past medical history, social, surgical and family history all reviewed in electronic medical record.  No pertanent information unless stated regarding to the chief complaint.   Review of Systems:  No headache, visual changes, nausea, vomiting, diarrhea, constipation, dizziness, abdominal pain, skin rash, fevers, chills, night sweats, weight loss, swollen lymph nodes, joint swelling, chest pain, shortness of breath, mood changes. POSITIVE muscle aches, body aches increase in anxiety recently  Objective  Blood pressure 120/82, pulse 92, height 5\' 10"  (1.778 m), SpO2 99 %.   General: No apparent distress alert and oriented x3 mood and affect normal, dressed appropriately.  HEENT: Pupils equal, extraocular movements intact  Respiratory: Patient's speak  in full sentences and does not appear short of breath  Cardiovascular: No lower extremity edema, non tender, no erythema  Gait mild antalgic Left foot exam fairly unremarkable.  Mild dry foot.  No cracking of the skin.  Patient seems to be neurovascularly intact distally.  Patient has relatively good strength of the ankles bilaterally.  Mild increase in tightness with straight leg test on the left side.  No true radicular symptoms at this time now.  Mild pain in the back with extension.    Impression and Recommendations:    The above documentation has been reviewed and is accurate and complete Lyndal Pulley, DO

## 2020-10-18 NOTE — Assessment & Plan Note (Signed)
Somewhat concerned that patient's foot pain is secondary to more of the lumbar radiculopathy.  Patient has been stable for some time.  Discussed x-rays today to further evaluate.  May need the possibility of epidurals again.  Mild positive straight leg test noted today.  We will get a possible peripheral neuropathy work-up as well.  Has had significant low vitamin D and will consider supplementation based on the labs.  Follow-up with me again 4 to 5 weeks otherwise.

## 2020-10-20 ENCOUNTER — Telehealth: Payer: Self-pay | Admitting: Family Medicine

## 2020-10-20 NOTE — Telephone Encounter (Signed)
Pt has a few questions about his labs:  Kidney specialist recommended 2000 IU daily of Vit D, would we agree or should he do the once weekly that Dr. Tamala Julian recommended.  He would prefer to restart B12 injections instead of the daily pills, if we agree.  Would any of his lab results help explain his abdominal pain?  [phone]

## 2020-10-21 ENCOUNTER — Ambulatory Visit: Payer: PPO | Admitting: Physical Therapy

## 2020-10-21 ENCOUNTER — Ambulatory Visit: Payer: PPO | Admitting: Family Medicine

## 2020-10-21 DIAGNOSIS — E538 Deficiency of other specified B group vitamins: Secondary | ICD-10-CM | POA: Diagnosis not present

## 2020-10-21 NOTE — Telephone Encounter (Signed)
Spoke with patient. He prefers to come in on Monday to get B12 injection. Placed on [place] schedule.

## 2020-10-21 NOTE — Telephone Encounter (Signed)
Per patient, William [name] will manage his B12 injections. More convenient as closer to his home. Appt with [place] cancelled.

## 2020-10-24 ENCOUNTER — Ambulatory Visit: Payer: PPO

## 2020-10-24 DIAGNOSIS — E559 Vitamin D deficiency, unspecified: Secondary | ICD-10-CM | POA: Diagnosis not present

## 2020-10-24 DIAGNOSIS — R102 Pelvic and perineal pain: Secondary | ICD-10-CM | POA: Diagnosis not present

## 2020-10-24 DIAGNOSIS — M6289 Other specified disorders of muscle: Secondary | ICD-10-CM | POA: Diagnosis not present

## 2020-10-24 DIAGNOSIS — G43719 Chronic migraine without aura, intractable, without status migrainosus: Secondary | ICD-10-CM | POA: Diagnosis not present

## 2020-10-24 DIAGNOSIS — I471 Supraventricular tachycardia: Secondary | ICD-10-CM | POA: Diagnosis not present

## 2020-10-24 DIAGNOSIS — F438 Other reactions to severe stress: Secondary | ICD-10-CM | POA: Diagnosis not present

## 2020-10-24 DIAGNOSIS — M79605 Pain in left leg: Secondary | ICD-10-CM | POA: Diagnosis not present

## 2020-10-24 DIAGNOSIS — E538 Deficiency of other specified B group vitamins: Secondary | ICD-10-CM | POA: Diagnosis not present

## 2020-10-24 DIAGNOSIS — K3184 Gastroparesis: Secondary | ICD-10-CM | POA: Diagnosis not present

## 2020-10-24 DIAGNOSIS — D519 Vitamin B12 deficiency anemia, unspecified: Secondary | ICD-10-CM | POA: Diagnosis not present

## 2020-10-24 DIAGNOSIS — G8929 Other chronic pain: Secondary | ICD-10-CM | POA: Diagnosis not present

## 2020-10-24 DIAGNOSIS — R002 Palpitations: Secondary | ICD-10-CM | POA: Diagnosis not present

## 2020-10-25 DIAGNOSIS — E538 Deficiency of other specified B group vitamins: Secondary | ICD-10-CM | POA: Diagnosis not present

## 2020-11-17 DIAGNOSIS — K3184 Gastroparesis: Secondary | ICD-10-CM | POA: Diagnosis not present

## 2020-11-17 DIAGNOSIS — R102 Pelvic and perineal pain: Secondary | ICD-10-CM | POA: Diagnosis not present

## 2020-11-17 DIAGNOSIS — M6289 Other specified disorders of muscle: Secondary | ICD-10-CM | POA: Diagnosis not present

## 2020-11-17 DIAGNOSIS — F438 Other reactions to severe stress: Secondary | ICD-10-CM | POA: Diagnosis not present

## 2020-11-17 DIAGNOSIS — I471 Supraventricular tachycardia: Secondary | ICD-10-CM | POA: Diagnosis not present

## 2020-11-17 DIAGNOSIS — F4323 Adjustment disorder with mixed anxiety and depressed mood: Secondary | ICD-10-CM | POA: Diagnosis not present

## 2020-11-17 DIAGNOSIS — R002 Palpitations: Secondary | ICD-10-CM | POA: Diagnosis not present

## 2020-11-17 DIAGNOSIS — E538 Deficiency of other specified B group vitamins: Secondary | ICD-10-CM | POA: Diagnosis not present

## 2020-11-17 DIAGNOSIS — G8929 Other chronic pain: Secondary | ICD-10-CM | POA: Diagnosis not present

## 2020-11-17 DIAGNOSIS — D519 Vitamin B12 deficiency anemia, unspecified: Secondary | ICD-10-CM | POA: Diagnosis not present

## 2020-11-17 DIAGNOSIS — G43719 Chronic migraine without aura, intractable, without status migrainosus: Secondary | ICD-10-CM | POA: Diagnosis not present

## 2020-11-17 DIAGNOSIS — E559 Vitamin D deficiency, unspecified: Secondary | ICD-10-CM | POA: Diagnosis not present

## 2020-11-18 DIAGNOSIS — E538 Deficiency of other specified B group vitamins: Secondary | ICD-10-CM | POA: Diagnosis not present

## 2020-11-22 NOTE — Progress Notes (Deleted)
[facility] Phone: [phone] Subjective:    I'm seeing this patient by the request  of:  Shawnee Knapp, MD  CC:   [id number]  10/18/2020 Patient does bring up caregiver burden.  Patient has been the primary caregiver for his mom for quite some time.  Has not been able to focus on himself.  Patient is willing to talk to behavioral health and referral placed today.  Full range of motion at this point.  Doing very well.  No significant difficulties.  Follow-up again 5 to 6 weeks  Somewhat concerned that patient's foot pain is secondary to more of the lumbar radiculopathy.  Patient has been stable for some time.  Discussed x-rays today to further evaluate.  May need the possibility of epidurals again.  Mild positive straight leg test noted today.  We will get a possible peripheral neuropathy work-up as well.  Has had significant low vitamin D and will consider supplementation based on the labs.  Follow-up with me again 4 to 5 weeks otherwise.  Update 11/23/2020 William [name] is a 60 y.o. male coming in with complaint of back and R shoulder pain. Patient states   Onset-  Location Duration-  Character- Aggravating factors- Reliving factors-  Therapies tried-  Severity-     Past Medical History:  Diagnosis Date   Allergy    Anxiety    Depression    Gastroparesis    Heart murmur    Migraine headache    Mitral valve prolapse    SVT (supraventricular tachycardia) (HCC)    No past surgical history on file. Social History   Socioeconomic History   Marital status: Married    Spouse name: Not on file   Number of children: 0   Years of education: Not on file   Highest education level: Bachelor's degree (e.g., BA, AB, BS)  Occupational History   Not on file  Tobacco Use   Smoking status: Never   Smokeless tobacco: Never  Vaping Use   Vaping Use: Never used  Substance and Sexual Activity    Alcohol use: No    Alcohol/week: 0.0 standard drinks   Drug use: No   Sexual activity: Yes    Partners: Female  Other Topics Concern   Not on file  Social History Narrative   Pt is married lives with spouse in 1 story apartment he has No children   BS degree- he is right handed   Drinks soda  Ginger ale, no coffee no tea   Right handed   Social Determinants of Radio broadcast assistant Strain: Not on file  Food Insecurity: Not on file  Transportation Needs: Not on file  Physical Activity: Not on file  Stress: Not on file  Social Connections: Not on file   Allergies  Allergen Reactions   Erythromycin Anaphylaxis and Nausea And Vomiting   Nalbuphine Other (See Comments)    States loss of consciousness    Cefdinir Diarrhea, Nausea And Vomiting and Rash   Cephalexin Diarrhea and Nausea And Vomiting   Cephalosporins Diarrhea and Nausea And Vomiting   Hydrocodone-Acetaminophen Nausea And Vomiting   Ketorolac Nausea And Vomiting   Prochlorperazine Edisylate Other (See Comments)    uncontrollable shake - tremor    Restasis [Cyclosporine] Other (See Comments)    migraines   Tyloxapol Swelling   Propranolol Diarrhea   Topamax [Topiramate] Diarrhea and Nausea And Vomiting   Zetia [Ezetimibe] Diarrhea  Doxycycline Hyclate Nausea Only and Other (See Comments)    abd pain   Iodinated Diagnostic Agents Other (See Comments)    Lightheaded, flushed feeling (explained to patient these are normal side effects of IV iodinated contrast, not allergies, but he wanted this noted in the epic; he tolerated epidural steroid injections w/ contrast w/o difficulty)   Ketoconazole Itching   Latex Itching   Neosporin [Neomycin-Bacitracin Zn-Polymyx] Rash   Oxycodone-Acetaminophen Other (See Comments)    Tylox caused constipation   Prednisone Nausea And Vomiting    Oral route, only   Family History  Problem Relation Age of Onset   Hypertension Mother    Hypertension Brother    Kidney  failure Maternal Grandmother    Prostate cancer Neg Hx    Kidney cancer Neg Hx      Current Outpatient Medications (Cardiovascular):    diltiazem (CARDIZEM CD) 180 MG 24 hr capsule, Take 1 capsule (180 mg total) by mouth daily.   diltiazem (CARDIZEM) 30 MG tablet, TAKE (1) TABLET AS NEEDED FOR PALPITATIONS  Current Outpatient Medications (Respiratory):    fluticasone (FLONASE) 50 MCG/ACT nasal spray, Place 2 sprays into both nostrils daily.  Current Outpatient Medications (Analgesics):    EMGALITY 120 MG/ML SOAJ,    Rimegepant Sulfate (NURTEC) 75 MG TBDP, Take 75 mg by mouth as needed for up to 1 dose (Maximum 1 tablet in 24 hours.).   Current Outpatient Medications (Other):    baclofen (LIORESAL) 10 MG tablet, Take 10 mg by mouth 3 (three) times daily.   COVID-19 mRNA vaccine, Pfizer, 30 MCG/0.3ML injection, INJECT AS DIRECTED   metoCLOPramide (REGLAN) 5 MG tablet, Take 5 mg by mouth as needed.   ondansetron (ZOFRAN) 4 MG tablet, Take 4 mg by mouth as needed.   Reviewed prior external information including notes and imaging from  primary care provider As well as notes that were available from care everywhere and other healthcare systems.  Past medical history, social, surgical and family history all reviewed in electronic medical record.  No pertanent information unless stated regarding to the chief complaint.   Review of Systems:  No headache, visual changes, nausea, vomiting, diarrhea, constipation, dizziness, abdominal pain, skin rash, fevers, chills, night sweats, weight loss, swollen lymph nodes, body aches, joint swelling, chest pain, shortness of breath, mood changes. POSITIVE muscle aches  Objective  There were no vitals taken for this visit.   General: No apparent distress alert and oriented x3 mood and affect normal, dressed appropriately.  HEENT: Pupils equal, extraocular movements intact  Respiratory: Patient's speak in full sentences and does not appear short of  breath  Cardiovascular: No lower extremity edema, non tender, no erythema  Gait normal with good balance and coordination.  MSK:  Non tender with full range of motion and good stability and symmetric strength and tone of shoulders, elbows, wrist, hip, knee and ankles bilaterally.     Impression and Recommendations:     The above documentation has been reviewed and is accurate and complete Jacqualin Combes

## 2020-11-23 ENCOUNTER — Ambulatory Visit: Payer: PPO | Admitting: Family Medicine

## 2020-11-29 ENCOUNTER — Other Ambulatory Visit: Payer: Self-pay

## 2020-11-29 ENCOUNTER — Encounter: Payer: Self-pay | Admitting: Physical Therapy

## 2020-11-29 ENCOUNTER — Ambulatory Visit: Payer: PPO | Attending: Family Medicine | Admitting: Physical Therapy

## 2020-11-29 DIAGNOSIS — M545 Low back pain, unspecified: Secondary | ICD-10-CM | POA: Insufficient documentation

## 2020-11-29 DIAGNOSIS — G8929 Other chronic pain: Secondary | ICD-10-CM | POA: Insufficient documentation

## 2020-11-29 DIAGNOSIS — M25511 Pain in right shoulder: Secondary | ICD-10-CM | POA: Diagnosis not present

## 2020-11-29 DIAGNOSIS — R202 Paresthesia of skin: Secondary | ICD-10-CM | POA: Insufficient documentation

## 2020-11-29 DIAGNOSIS — M542 Cervicalgia: Secondary | ICD-10-CM | POA: Insufficient documentation

## 2020-11-29 DIAGNOSIS — M62838 Other muscle spasm: Secondary | ICD-10-CM | POA: Diagnosis not present

## 2020-11-29 DIAGNOSIS — M6281 Muscle weakness (generalized): Secondary | ICD-10-CM | POA: Insufficient documentation

## 2020-11-29 NOTE — Patient Instructions (Signed)
Access Code: [email or web address] URL: [email or web address] Date: 11/29/2020 Prepared by: Ruben Im  Exercises Prone Press Up - 3 x daily - 7 x weekly - 1 sets - 7 reps Standing Lumbar Extension - 3 x daily - 7 x weekly - 1 sets - 10 reps Chin Tuck - 3 x daily - 7 x weekly - 1 sets - 7 reps Seated Scapular Retraction - 3 x daily - 7 x weekly - 1 sets - 10 reps

## 2020-11-29 NOTE — Therapy (Signed)
[facility] [address], [place], [name], [address] Phone: [phone]   Fax:  [phone]  Physical Therapy Evaluation  Patient Details  Name: William [name] MRN: [id number] Date of Birth: 10/01/1960 Referring Provider (PT): Dr [name]   Encounter Date: 11/29/2020   PT End of Session - 11/29/20 1354     Visit Number 1    Date for PT Re-Evaluation 02/21/21    Authorization Type [employer]    PT Start Time 0930    PT Stop Time 1015    PT Time Calculation (min) 45 min    Activity Tolerance Patient tolerated treatment well             Past Medical History:  Diagnosis Date   Allergy    Anxiety    Depression    Gastroparesis    Heart murmur    Migraine headache    Mitral valve prolapse    SVT (supraventricular tachycardia) ([place])     History reviewed. No pertinent surgical history.  There were no vitals filed for this visit.    Subjective Assessment - 11/29/20 0933     Subjective I had one more appt with you but I got hurt exercising.  I just stopped execising.  Tingling left hand all the way up the arm and leg.  Dr. Tamala Julian said it could be a vitamin deficiency.  I've been taking vitamin B and D.  Tingling lower leg to foot.  I wonder if it was picking up water.  Nothing on the right side.    Pertinent History supraspinatus tendinosis; mild A/C arthritis;  history of LBP;  anxiety,depression, migraines;  previous pt of [facility] [name], [place])    How long can you sit comfortably? 30 min    How long can you walk comfortably? I do a lot of walking and I don't notice it as much    Patient Stated Goals get rid of this pain and function (I keep re-injuring myself with normal things like at [employer] lifting bottled water and cleaning tub lose balance with leaning in    Currently in Pain? Yes    Pain Location Back    Pain Orientation Left;Lower    Pain Type Chronic pain    Pain Radiating Towards tingling  in left foot    Aggravating Factors  sitting especially in front of the computer    Pain Score 0    Pain Radiating Towards left hand tingling in palm, upper arm                [facility] PT Assessment - 11/29/20 0001       Assessment   Medical Diagnosis UE paresthesia; back pain; left LE paresthesia    Referring Provider (PT) Dr [name]    Prior Therapy at [facility] for back and right  shoulder      Precautions   Precautions None      Restrictions   Weight Bearing Restrictions No      Balance Screen   Has the patient fallen in the past 6 months No    Has the patient had a decrease in activity level because of a fear of falling?  No    Is the patient reluctant to leave their home because of a fear of falling?  No      Home Ecologist residence    Living Arrangements Spouse/significant other;Other relatives    Additional Comments mother lives with  them      Prior Function   Level of Independence Independent with basic ADLs      Observation/Other Assessments   Focus on Therapeutic Outcomes (FOTO)  50%      Posture/Postural Control   Posture Comments cervical protrusion, inc thoracic kyphosis; dec lumbar lordosis      AROM   AROM Assessment Site --   lumbar extension preference   Cervical Flexion 50    Cervical Extension 50    Cervical - Right Side Bend 30    Cervical - Left Side Bend 32    Cervical - Right Rotation 40    Cervical - Left Rotation 40   painful   Lumbar Flexion 40    Lumbar Extension 20    Lumbar - Right Side Bend 35    Lumbar - Left Side Bend 35      Strength   Overall Strength Comments dec strength with single leg heel raise; hip abd/ext bil 4-/5    Lumbar Flexion 4-/5    Lumbar Extension 4-/5      other    Comment + left upper limb tension but no change in paresthesia      Slump test   Side Left    Comment also reactive on right as well                        Objective measurements completed on  examination: See above findings.               PT Education - 11/29/20 1353     Education Details prone press ups; standing extensions;  seated cervical retractions; scapular squeezes    Person(s) Educated Patient    Methods Explanation;Demonstration;Handout    Comprehension Returned demonstration;Verbalized understanding              PT Short Term Goals - 11/29/20 1722       PT SHORT TERM GOAL #1   Title Pt will be independent with his initial HEP for  reduction of paresthesia and pain    Time 6    Period Weeks    Status New    Target Date 01/10/21      PT SHORT TERM GOAL #2   Title The patient will report a 30% improvement in left UE and left LE paresthesia with sitting at the computer and with basic ADLs    Time 6    Period Weeks    Status New      PT SHORT TERM GOAL #3   Title The patient will demonstrate improved lumbar flexion to 50 degrees and cervical sidebending to 38 degrees bil needed for cleaning the tub and other household chores    Time 6    Period Weeks    Status New      PT SHORT TERM GOAL #4   Title FOTO score improved from 50% to 58%    Time 6    Period Weeks    Status New               PT Long Term Goals - 11/29/20 1726       PT LONG TERM GOAL #1   Title Pt will be independent with his advanced HEP to improve ROM and  strength  with daily activity after d/c from PT.    Time 12    Period Weeks    Status New    Target Date 02/21/21  PT LONG TERM GOAL #2   Title Pt will report  at least 60% improvement in left UE and left LE parethesia with usual ADLs    Time 12    Period Weeks    Status New      PT LONG TERM GOAL #3   Title The patient will have grossly 4+/5 lumbar/pelvic/hip strength  needed for lifting cases of water    Time 12    Period Weeks    Status New      PT LONG TERM GOAL #4   Title FOTO functional outcome score improved to 65%    Time 12    Period Weeks    Status New                     Plan - 11/29/20 1355     Clinical Impression Statement The patient is well known to this facility for treatment of right shoulder pain and low back pain.  One or two months ago he had the onset of left upper arm and hand tingling and left lower leg and foot tingling.  He was fearful of exacerbating his symptoms so he discontinued his ex's except for walking which he does frequently without symptom aggravation.    He is unsure of the cause but is being treated for a vitamin deficiency.  He wonders if lifting cases of bottled water every few weeks at [employer] may be a contributing factor.  He reports UE symptoms and neck pain are aggravated with prolonged sitting/computer use.  Forward head, inc kyphotic posture in sitting noted.  Cervical ROM is WFLs except mild stiffness with sidebending and painful left rotation.  Neural tension in left UE with dynamic testing.  Decreased lumbar flexion ROM 40 degrees, extension 20 degrees.  Possible extension movement bias.  + slump tests left and right.  Difficulty performing left single leg heel raise.  With focus he is able to hip hinge with bending over but states he is often forgetful of this during functional activities.  He would benefit from PT to address left UE and LE paresthesia and for strengthening and patient education to improve resiliency to injury.    Personal Factors and Comorbidities Comorbidity 1;Comorbidity 2;Comorbidity 3+;Past/Current Experience    Comorbidities chronic history spinal pain, anxiety, depression, migraines    Examination-Participation Restrictions Interpersonal Relationship;Cleaning;Community Activity;Driving    Stability/Clinical Decision Making Stable/Uncomplicated    Clinical Decision Making Low    Rehab Potential Good    PT Frequency 2x / week    PT Duration 12 weeks    PT Treatment/Interventions ADLs/Self Care Home Management;Electrical Stimulation;Cryotherapy;Aquatic Therapy;Moist  Heat;Traction;Ultrasound;Neuromuscular re-education;Therapeutic exercise;Therapeutic activities;Patient/family education;Manual techniques;Dry needling;Taping;Spinal Manipulations;Iontophoresis '4mg'$ /ml Dexamethasone    PT Next Visit Plan review cervical retractions and prone press ups from intial HEP;  left UE neural mobility, LE segmental neural gliding in supine; lumbar neutral and extension biased ex;  review hip hinge    PT Home Exercise Plan [place] and Agree with Plan of Care Patient             Patient will benefit from skilled therapeutic intervention in order to improve the following deficits and impairments:  Decreased range of motion, Pain, Impaired perceived functional ability, Decreased strength, Postural dysfunction, Improper body mechanics, Increased muscle spasms, Decreased activity tolerance  Visit Diagnosis: Paresthesia of skin - Plan: PT plan of care cert/re-cert  Chronic bilateral low back pain, unspecified whether sciatica present - Plan: PT plan of  care cert/re-cert  Cervicalgia - Plan: PT plan of care cert/re-cert  Muscle weakness (generalized) - Plan: PT plan of care cert/re-cert     Problem List Patient Active Problem List   Diagnosis Date Noted   Caregiver burden 10/18/2020   Arthritis of right acromioclavicular joint 02/18/2020   Right rotator cuff tear 12/18/2019   Degenerative cervical disc 05/07/2019   Left lumbar radiculopathy 10/10/2017   Migraine without aura and without status migrainosus, not intractable 06/22/2017   Vitamin D deficiency 05/28/2017   Scrotal pain 05/28/2017   Paroxysmal SVT (supraventricular tachycardia) ([place]) 05/28/2017   Mild left inguinal hernia 11/23/2016   Chronic pelvic pain in male 09/28/2016   Abdominal pain, left lower quadrant 09/28/2016   Isolated proteinuria without specific morphologic lesion 06/27/2016   Left testicular pain 06/27/2016   Acute left flank pain 06/27/2016   Epididymitis, left  06/05/2016   Pelvic floor dysfunction 04/01/2016   GERD (gastroesophageal reflux disease) 10/13/2015   Gastroparesis 10/13/2015   Intractable chronic migraine without aura and without status migrainosus 12/15/2013   Hyperlipidemia 04/04/2012   Mitral valve disease 07/15/2009   IRRITABLE BOWEL SYNDROME 07/15/2009   Palpitations 06/28/2009   Ruben Im, PT 11/29/20 5:32 PM Phone: [phone] Fax: [phone]  Alvera Singh 11/29/2020, 5:31 PM  [facility] [address] [place], [name], [address] Phone: [phone]   Fax:  [phone]  Name: William [name] MRN: [id number] Date of Birth: [birth year]

## 2020-12-02 ENCOUNTER — Ambulatory Visit: Payer: PPO | Admitting: Physical Therapy

## 2020-12-02 ENCOUNTER — Encounter: Payer: Self-pay | Admitting: Physical Therapy

## 2020-12-02 ENCOUNTER — Other Ambulatory Visit: Payer: Self-pay

## 2020-12-02 DIAGNOSIS — M62838 Other muscle spasm: Secondary | ICD-10-CM

## 2020-12-02 DIAGNOSIS — M6281 Muscle weakness (generalized): Secondary | ICD-10-CM

## 2020-12-02 DIAGNOSIS — M545 Low back pain, unspecified: Secondary | ICD-10-CM

## 2020-12-02 DIAGNOSIS — G8929 Other chronic pain: Secondary | ICD-10-CM

## 2020-12-02 DIAGNOSIS — R202 Paresthesia of skin: Secondary | ICD-10-CM | POA: Diagnosis not present

## 2020-12-02 DIAGNOSIS — M542 Cervicalgia: Secondary | ICD-10-CM

## 2020-12-02 NOTE — Therapy (Signed)
[facility] [address] [place], [name], [address] Phone: [phone]   Fax:  [phone]  Physical Therapy Treatment  Patient Details  Name: William [name] MRN: [id number] Date of Birth: 10/17/1960 Referring Provider (PT): Dr [name]   Encounter Date: 12/02/2020   PT End of Session - 12/02/20 0931     Visit Number 2    Date for PT Re-Evaluation 02/21/21    Authorization Type [employer]    PT Start Time 0931    PT Stop Time 1009    PT Time Calculation (min) 38 min    Activity Tolerance Patient tolerated treatment well    Behavior During Therapy [facility] for tasks assessed/performed             Past Medical History:  Diagnosis Date   Allergy    Anxiety    Depression    Gastroparesis    Heart murmur    Migraine headache    Mitral valve prolapse    SVT (supraventricular tachycardia) ([place])     History reviewed. No pertinent surgical history.  There were no vitals filed for this visit.   Subjective Assessment - 12/02/20 0932     Subjective I have been struggling with headaches this week.    Pertinent History supraspinatus tendinosis; mild A/C arthritis;  history of LBP;  anxiety,depression, migraines;  previous pt of [facility] [name], [place])    Currently in Pain? Yes   Headache this AM.                              [facility] Adult PT Treatment/Exercise - 12/02/20 0001       Neck Exercises: Seated   Other Seated Exercise Scap retraction 5 sec hold 10x, cervical retraction 5 sec hold 10x   VC for technique     Lumbar Exercises: Stretches   Other Lumbar Stretch Exercise Standing and prone extensions/press ups 10x each: good return demo    Other Lumbar Stretch Exercise Supine neural flossing 10x Bil each at knee and then at ankle      Lumbar Exercises: Aerobic   UBE (Upper Arm Bike) L1 2x2 with PTA present, VC to relax UT      Shoulder Exercises: Standing   Other Standing  Exercises Pilates Serve teh Tray motion 10x to enahance UE mobility without UT gripping.                      PT Short Term Goals - 11/29/20 1722       PT SHORT TERM GOAL #1   Title Pt will be independent with his initial HEP for  reduction of paresthesia and pain    Time 6    Period Weeks    Status New    Target Date 01/10/21      PT SHORT TERM GOAL #2   Title The patient will report a 30% improvement in left UE and left LE paresthesia with sitting at the computer and with basic ADLs    Time 6    Period Weeks    Status New      PT SHORT TERM GOAL #3   Title The patient will demonstrate improved lumbar flexion to 50 degrees and cervical sidebending to 38 degrees bil needed for cleaning the tub and other household chores    Time 6    Period Weeks    Status New  PT SHORT TERM GOAL #4   Title FOTO score improved from 50% to 58%    Time 6    Period Weeks    Status New               PT Long Term Goals - 11/29/20 1726       PT LONG TERM GOAL #1   Title Pt will be independent with his advanced HEP to improve ROM and  strength  with daily activity after d/c from PT.    Time 12    Period Weeks    Status New    Target Date 02/21/21      PT LONG TERM GOAL #2   Title Pt will report  at least 60% improvement in left UE and left LE parethesia with usual ADLs    Time 12    Period Weeks    Status New      PT LONG TERM GOAL #3   Title The patient will have grossly 4+/5 lumbar/pelvic/hip strength  needed for lifting cases of water    Time 12    Period Weeks    Status New      PT LONG TERM GOAL #4   Title FOTO functional outcome score improved to 65%    Time 12    Period Weeks    Status New                   Plan - 12/02/20 [id number]     Clinical Impression Statement Pt presents today with mild headache. Pt is independent and compliant with new HEP. Pt tends to make max effort contractions with his cervical retraction and then he does not fully  relax between the repetitions. Pt did show some improvements with this during todays session but will require more pactice. No complaints of UE or LE pain/symptoms during todays session.    Personal Factors and Comorbidities Comorbidity 1;Comorbidity 2;Comorbidity 3+;Past/Current Experience    Comorbidities chronic history spinal pain, anxiety, depression, migraines    Examination-Activity Limitations Lift;Caring for Others;Locomotion Level;Sit;Sleep;Stand    Examination-Participation Restrictions Interpersonal Relationship;Cleaning;Community Activity;Driving    Stability/Clinical Decision Making Stable/Uncomplicated    Rehab Potential Good    PT Duration 12 weeks    PT Treatment/Interventions ADLs/Self Care Home Management;Electrical Stimulation;Cryotherapy;Aquatic Therapy;Moist Heat;Traction;Ultrasound;Neuromuscular re-education;Therapeutic exercise;Therapeutic activities;Patient/family education;Manual techniques;Dry needling;Taping;Spinal Manipulations;Iontophoresis '4mg'$ /ml Dexamethasone    PT Next Visit Plan Left UE neural mobility, LE segmental neural gliding in supine; lumbar neutral and extension biased ex;  review hip hinge    PT Home Exercise Plan [place] and Agree with Plan of Care Patient             Patient will benefit from skilled therapeutic intervention in order to improve the following deficits and impairments:  Decreased range of motion, Pain, Impaired perceived functional ability, Decreased strength, Postural dysfunction, Improper body mechanics, Increased muscle spasms, Decreased activity tolerance  Visit Diagnosis: Paresthesia of skin  Chronic bilateral low back pain, unspecified whether sciatica present  Cervicalgia  Muscle weakness (generalized)  Other muscle spasm     Problem List Patient Active Problem List   Diagnosis Date Noted   Caregiver burden 10/18/2020   Arthritis of right acromioclavicular joint 02/18/2020   Right rotator cuff  tear 12/18/2019   Degenerative cervical disc 05/07/2019   Left lumbar radiculopathy 10/10/2017   Migraine without aura and without status migrainosus, not intractable 06/22/2017   Vitamin D deficiency 05/28/2017   Scrotal pain 05/28/2017   Paroxysmal  SVT (supraventricular tachycardia) ([place]) 05/28/2017   Mild left inguinal hernia 11/23/2016   Chronic pelvic pain in male 09/28/2016   Abdominal pain, left lower quadrant 09/28/2016   Isolated proteinuria without specific morphologic lesion 06/27/2016   Left testicular pain 06/27/2016   Acute left flank pain 06/27/2016   Epididymitis, left 06/05/2016   Pelvic floor dysfunction 04/01/2016   GERD (gastroesophageal reflux disease) 10/13/2015   Gastroparesis 10/13/2015   Intractable chronic migraine without aura and without status migrainosus 12/15/2013   Hyperlipidemia 04/04/2012   Mitral valve disease 07/15/2009   IRRITABLE BOWEL SYNDROME 07/15/2009   Palpitations 06/28/2009    Amorette Charrette, PTA 12/02/2020, 10:10 AM  [facility] [address] [place], [name], [address] Phone: [phone]   Fax:  [phone]  Name: William [name] MRN: [id number] Date of Birth: 26-Apr-1961

## 2020-12-05 ENCOUNTER — Other Ambulatory Visit: Payer: Self-pay

## 2020-12-05 ENCOUNTER — Ambulatory Visit: Payer: PPO | Admitting: Physical Therapy

## 2020-12-05 DIAGNOSIS — M542 Cervicalgia: Secondary | ICD-10-CM

## 2020-12-05 DIAGNOSIS — M545 Low back pain, unspecified: Secondary | ICD-10-CM

## 2020-12-05 DIAGNOSIS — M62838 Other muscle spasm: Secondary | ICD-10-CM

## 2020-12-05 DIAGNOSIS — M6281 Muscle weakness (generalized): Secondary | ICD-10-CM

## 2020-12-05 DIAGNOSIS — R202 Paresthesia of skin: Secondary | ICD-10-CM | POA: Diagnosis not present

## 2020-12-05 DIAGNOSIS — M25511 Pain in right shoulder: Secondary | ICD-10-CM

## 2020-12-05 NOTE — Therapy (Signed)
[facility] [address] [place], [name], [address] Phone: [phone]   Fax:  [phone]  Physical Therapy Treatment  Patient Details  Name: William [name] MRN: [id number] Date of Birth: 08-12-1960 Referring Provider (PT): Dr [name]   Encounter Date: 12/05/2020   PT End of Session - 12/05/20 1151     Visit Number 3    Date for PT Re-Evaluation 02/21/21    Authorization Type [employer]    PT Start Time 1146    PT Stop Time 1229    PT Time Calculation (min) 43 min    Activity Tolerance Patient tolerated treatment well    Behavior During Therapy [facility] for tasks assessed/performed             Past Medical History:  Diagnosis Date   Allergy    Anxiety    Depression    Gastroparesis    Heart murmur    Migraine headache    Mitral valve prolapse    SVT (supraventricular tachycardia) ([place])     No past surgical history on file.  There were no vitals filed for this visit.   Subjective Assessment - 12/05/20 1149     Subjective Still struggling with heaches. I keep finding mold in my appt so i am trying to clean a lot. I would like to try a little more exercise today    Pertinent History supraspinatus tendinosis; mild A/C arthritis;  history of LBP;  anxiety,depression, migraines;  previous pt of BF [name], [place])    Currently in Pain? No/denies   Just tired from my headches, no current HA   Multiple Pain Sites No                               [facility] Adult PT Treatment/Exercise - 12/05/20 0001       Therapeutic Activites    Therapeutic Activities Lifting    Lifting Lift and carry 10# box from mat to mat 5x, VC to squeeze scap together more vs elevating      Lumbar Exercises: Aerobic   UBE (Upper Arm Bike) L 1.5 3x3    Nustep L1 x 6 min with PTA present      Lumbar Exercises: Machines for Strengthening   Leg Press Seat 7 50# 2x10 bil      Shoulder Exercises: Supine    Other Supine Exercises Laying on soft foam roll: shoulder flexion stretching overhead 10x alt.      Shoulder Exercises: Standing   Row Strengthening;Both;Theraband;20 reps    Theraband Level (Shoulder Row) Level 2 (Red)    Other Standing Exercises Shoulder circles: Vc to relax UT   Pilates Serve the tray 10x                     PT Short Term Goals - 12/05/20 1212       PT SHORT TERM GOAL #1   Title Pt will be independent with his initial HEP for  reduction of paresthesia and pain    Time 6    Period Weeks    Status Achieved    Target Date 01/10/21               PT Long Term Goals - 11/29/20 1726       PT LONG TERM GOAL #1   Title Pt will be independent with his advanced HEP to improve ROM and  strength  with daily activity after d/c from PT.    Time 12    Period Weeks    Status New    Target Date 02/21/21      PT LONG TERM GOAL #2   Title Pt will report  at least 60% improvement in left UE and left LE parethesia with usual ADLs    Time 12    Period Weeks    Status New      PT LONG TERM GOAL #3   Title The patient will have grossly 4+/5 lumbar/pelvic/hip strength  needed for lifting cases of water    Time 12    Period Weeks    Status New      PT LONG TERM GOAL #4   Title FOTO functional outcome score improved to 65%    Time 12    Period Weeks    Status New                   Plan - 12/05/20 1230     Clinical Impression Statement Pt reports he is still dealing with his headache situation but denies current HA or pain. Pt was able to add light resistive exercises back into his program for both UE and LE today including lifting 10# box off low mat and putting it on the other mat. pt reported no pain with todays exercises/activities.    Personal Factors and Comorbidities Comorbidity 1;Comorbidity 2;Comorbidity 3+;Past/Current Experience    Comorbidities chronic history spinal pain, anxiety, depression, migraines    Examination-Activity  Limitations Lift;Caring for Others;Locomotion Level;Sit;Sleep;Stand    Examination-Participation Restrictions Interpersonal Relationship;Cleaning;Community Activity;Driving    Stability/Clinical Decision Making Stable/Uncomplicated    PT Frequency 2x / week    PT Duration 12 weeks    PT Treatment/Interventions ADLs/Self Care Home Management;Electrical Stimulation;Cryotherapy;Aquatic Therapy;Moist Heat;Traction;Ultrasound;Neuromuscular re-education;Therapeutic exercise;Therapeutic activities;Patient/family education;Manual techniques;Dry needling;Taping;Spinal Manipulations;Iontophoresis '4mg'$ /ml Dexamethasone    PT Next Visit Tonsina and Agree with Plan of Care Patient             Patient will benefit from skilled therapeutic intervention in order to improve the following deficits and impairments:  Decreased range of motion, Pain, Impaired perceived functional ability, Decreased strength, Postural dysfunction, Improper body mechanics, Increased muscle spasms, Decreased activity tolerance  Visit Diagnosis: Paresthesia of skin  Chronic bilateral low back pain, unspecified whether sciatica present  Cervicalgia  Muscle weakness (generalized)  Other muscle spasm  Right shoulder pain, unspecified chronicity     Problem List Patient Active Problem List   Diagnosis Date Noted   Caregiver burden 10/18/2020   Arthritis of right acromioclavicular joint 02/18/2020   Right rotator cuff tear 12/18/2019   Degenerative cervical disc 05/07/2019   Left lumbar radiculopathy 10/10/2017   Migraine without aura and without status migrainosus, not intractable 06/22/2017   Vitamin D deficiency 05/28/2017   Scrotal pain 05/28/2017   Paroxysmal SVT (supraventricular tachycardia) ([place]) 05/28/2017   Mild left inguinal hernia 11/23/2016   Chronic pelvic pain in male 09/28/2016   Abdominal pain, left lower quadrant 09/28/2016   Isolated  proteinuria without specific morphologic lesion 06/27/2016   Left testicular pain 06/27/2016   Acute left flank pain 06/27/2016   Epididymitis, left 06/05/2016   Pelvic floor dysfunction 04/01/2016   GERD (gastroesophageal reflux disease) 10/13/2015   Gastroparesis 10/13/2015   Intractable chronic migraine without aura and without status migrainosus 12/15/2013   Hyperlipidemia 04/04/2012   Mitral valve disease 07/15/2009  IRRITABLE BOWEL SYNDROME 07/15/2009   Palpitations 06/28/2009    Shalanda Brogden, PTA 12/05/2020, 12:32 PM  [facility] [address] [place], [name], [address] Phone: [phone]   Fax:  [phone]  Name: William [name] MRN: [id number] Date of Birth: 1960/07/25

## 2020-12-08 ENCOUNTER — Ambulatory Visit: Payer: PPO | Admitting: Physical Therapy

## 2020-12-08 ENCOUNTER — Other Ambulatory Visit: Payer: Self-pay

## 2020-12-08 DIAGNOSIS — R202 Paresthesia of skin: Secondary | ICD-10-CM

## 2020-12-08 DIAGNOSIS — M545 Low back pain, unspecified: Secondary | ICD-10-CM

## 2020-12-08 NOTE — Patient Instructions (Signed)
Access Code: [email or web address] URL: [email or web address] Date: 12/08/2020 Prepared by: Ruben Im  Exercises Prone Press Up - 3 x daily - 7 x weekly - 1 sets - 7 reps Standing Lumbar Extension - 3 x daily - 7 x weekly - 1 sets - 10 reps Chin Tuck - 3 x daily - 7 x weekly - 1 sets - 7 reps Seated Scapular Retraction - 3 x daily - 7 x weekly - 1 sets - 10 reps Standing Hip Hinge with Dowel - 1 x daily - 7 x weekly - 1 sets - 10 reps

## 2020-12-08 NOTE — Therapy (Signed)
[facility] [address], [place], [name], [address] Phone: [phone]   Fax:  [phone]  Physical Therapy Treatment  Patient Details  Name: William [name] MRN: [id number] Date of Birth: Sep 23, 1960 Referring Provider (PT): Dr [name]   Encounter Date: 12/08/2020   PT End of Session - 12/08/20 1154     Visit Number 4    Date for PT Re-Evaluation 02/21/21    Authorization Type [employer]    PT Start Time 1145    PT Stop Time 1225    PT Time Calculation (min) 40 min    Activity Tolerance Patient tolerated treatment well             Past Medical History:  Diagnosis Date   Allergy    Anxiety    Depression    Gastroparesis    Heart murmur    Migraine headache    Mitral valve prolapse    SVT (supraventricular tachycardia) ([place])     No past surgical history on file.  There were no vitals filed for this visit.   Subjective Assessment - 12/08/20 1724     Subjective I think the black mold in my home is the cause of my headaches.  Headache present but would like to to some exercise anyway.    Pertinent History supraspinatus tendinosis; mild A/C arthritis;  history of LBP;  anxiety,depression, migraines;  previous pt of BF [name], [place])    Patient Stated Goals get rid of this pain and function (I keep re-injuring myself with normal things like at [employer] lifting bottled water and cleaning tub lose balance with leaning in    Currently in Pain? Yes    Pain Score 5     Pain Location Head                               [facility] Adult PT Treatment/Exercise - 12/08/20 0001       Therapeutic Activites    Lifting use of hip hinge for lifting cases of water      Lumbar Exercises: Aerobic   UBE (Upper Arm Bike) L 1.5 3x3      Lumbar Exercises: Machines for Strengthening   Leg Press Seat 7 60# 3x10 bil      Lumbar Exercises: Standing   Row Power tower;20 reps    Row  Limitations 25# staggered stance   30x   Other Standing Lumbar Exercises hip hinge using golf club for 3 points of contact 2x 10      Lumbar Exercises: Seated   Sit to Stand 5 reps    Sit to Stand Limitations with dowel for hip hinge                    PT Education - 12/08/20 1216     Education Details hip hinge with dowel    Person(s) Educated Patient    Methods Explanation;Demonstration;Handout    Comprehension Returned demonstration;Verbalized understanding              PT Short Term Goals - 12/05/20 1212       PT SHORT TERM GOAL #1   Title Pt will be independent with his initial HEP for  reduction of paresthesia and pain    Time 6    Period Weeks    Status Achieved    Target Date 01/10/21  PT Long Term Goals - 11/29/20 1726       PT LONG TERM GOAL #1   Title Pt will be independent with his advanced HEP to improve ROM and  strength  with daily activity after d/c from PT.    Time 12    Period Weeks    Status New    Target Date 02/21/21      PT LONG TERM GOAL #2   Title Pt will report  at least 60% improvement in left UE and left LE parethesia with usual ADLs    Time 12    Period Weeks    Status New      PT LONG TERM GOAL #3   Title The patient will have grossly 4+/5 lumbar/pelvic/hip strength  needed for lifting cases of water    Time 12    Period Weeks    Status New      PT LONG TERM GOAL #4   Title FOTO functional outcome score improved to 65%    Time 12    Period Weeks    Status New                   Plan - 12/08/20 1727     Clinical Impression Statement The patient reports headache is no worse at the conclusion of session.  He is highly receptive to hip hinge instruction with a dowel to ensure good body mechanics with lifting.  His primary  goal is to be able to lift cases of bottled water which he does every few weeks without causing a back flare up.  Therapist monitoring form and providing verbal cues as  needed.  No complaints of LBP or LE symptoms today.    Personal Factors and Comorbidities Comorbidity 1;Comorbidity 2;Comorbidity 3+;Past/Current Experience    Comorbidities chronic history spinal pain, anxiety, depression, migraines    Examination-Activity Limitations Lift;Caring for Others;Locomotion Level;Sit;Sleep;Stand    Examination-Participation Restrictions Interpersonal Relationship;Cleaning;Community Activity;Driving    Rehab Potential Good    PT Frequency 2x / week    PT Duration 12 weeks    PT Treatment/Interventions ADLs/Self Care Home Management;Electrical Stimulation;Cryotherapy;Aquatic Therapy;Moist Heat;Traction;Ultrasound;Neuromuscular re-education;Therapeutic exercise;Therapeutic activities;Patient/family education;Manual techniques;Dry needling;Taping;Spinal Manipulations;Iontophoresis '4mg'$ /ml Dexamethasone    PT Next Visit Plan review hip hinge with golf club/dowel;  Lifting simulation; lumbo/pelvic/hip and LE strengthening    PT Home Exercise Plan [facility]             Patient will benefit from skilled therapeutic intervention in order to improve the following deficits and impairments:  Decreased range of motion, Pain, Impaired perceived functional ability, Decreased strength, Postural dysfunction, Improper body mechanics, Increased muscle spasms, Decreased activity tolerance  Visit Diagnosis: Paresthesia of skin  Chronic bilateral low back pain, unspecified whether sciatica present     Problem List Patient Active Problem List   Diagnosis Date Noted   Caregiver burden 10/18/2020   Arthritis of right acromioclavicular joint 02/18/2020   Right rotator cuff tear 12/18/2019   Degenerative cervical disc 05/07/2019   Left lumbar radiculopathy 10/10/2017   Migraine without aura and without status migrainosus, not intractable 06/22/2017   Vitamin D deficiency 05/28/2017   Scrotal pain 05/28/2017   Paroxysmal SVT (supraventricular tachycardia) ([place]) 05/28/2017    Mild left inguinal hernia 11/23/2016   Chronic pelvic pain in male 09/28/2016   Abdominal pain, left lower quadrant 09/28/2016   Isolated proteinuria without specific morphologic lesion 06/27/2016   Left testicular pain 06/27/2016   Acute left flank pain 06/27/2016   Epididymitis, left  06/05/2016   Pelvic floor dysfunction 04/01/2016   GERD (gastroesophageal reflux disease) 10/13/2015   Gastroparesis 10/13/2015   Intractable chronic migraine without aura and without status migrainosus 12/15/2013   Hyperlipidemia 04/04/2012   Mitral valve disease 07/15/2009   IRRITABLE BOWEL SYNDROME 07/15/2009   Palpitations 06/28/2009   Ruben Im, PT 12/08/20 5:33 PM Phone: [phone] Fax: [phone]  Alvera Singh 12/08/2020, 5:33 PM  [facility] [address], [place], [name], [address] Phone: [phone]   Fax:  [phone]  Name: Deveyon Nazari MRN: [id number] Date of Birth: [birth year]

## 2020-12-13 ENCOUNTER — Other Ambulatory Visit: Payer: Self-pay

## 2020-12-13 ENCOUNTER — Ambulatory Visit: Payer: PPO | Admitting: Physical Therapy

## 2020-12-13 DIAGNOSIS — M545 Low back pain, unspecified: Secondary | ICD-10-CM

## 2020-12-13 DIAGNOSIS — R202 Paresthesia of skin: Secondary | ICD-10-CM

## 2020-12-13 DIAGNOSIS — G8929 Other chronic pain: Secondary | ICD-10-CM

## 2020-12-13 NOTE — Therapy (Signed)
[facility] [address] [place], [name], [address] Phone: [phone]   Fax:  [phone]  Physical Therapy Treatment  Patient Details  Name: William [name] MRN: [id number] Date of Birth: 07-10-1960 Referring Provider (PT): Dr [name]   Encounter Date: 12/13/2020   PT End of Session - 12/13/20 1350     Visit Number 5    Date for PT Re-Evaluation 02/21/21    Authorization Type [employer]    PT Start Time 1230    PT Stop Time 1314    PT Time Calculation (min) 44 min    Activity Tolerance Patient tolerated treatment well             Past Medical History:  Diagnosis Date   Allergy    Anxiety    Depression    Gastroparesis    Heart murmur    Migraine headache    Mitral valve prolapse    SVT (supraventricular tachycardia) ([place])     No past surgical history on file.  There were no vitals filed for this visit.   Subjective Assessment - 12/13/20 1231     Subjective I gave myself a headache injection yesterday.  The head has been the main issue.  I liked the ex with the stick behind my back.  No left sided pain or symptoms.    Pertinent History supraspinatus tendinosis; mild A/C arthritis;  history of LBP;  anxiety,depression, migraines;  previous pt of BF [name], [place])    Patient Stated Goals get rid of this pain and function (I keep re-injuring myself with normal things like at [employer] lifting bottled water and cleaning tub lose balance with leaning in    Currently in Pain? Yes    Pain Score 4     Pain Location Head                               [facility] Adult PT Treatment/Exercise - 12/13/20 0001       Therapeutic Activites    Lifting use of hip hinge for lifting cases of water; problem solving for how to put flat of bottled waters on grocery cart      Lumbar Exercises: Stretches   Other Lumbar Stretch Exercise 2nd step hip flexor stretch with arm overhead       Lumbar Exercises: Aerobic   UBE (Upper Arm Bike) L 1.5 3x3      Lumbar Exercises: Standing   Lifting Weights (lbs) 2 5# weights slide to knees    Lifting Limitations 10# kettlebell to mid lower leg; 10# plate weight to mid shin    Other Standing Lumbar Exercises green band mini rotations 15x each way    Other Standing Lumbar Exercises green band press out 20x      Knee/Hip Exercises: Standing   Other Standing Knee Exercises holding 10# kettlbell same side step ups 10x right/left                      PT Short Term Goals - 12/13/20 1357       PT SHORT TERM GOAL #1   Title Pt will be independent with his initial HEP for  reduction of paresthesia and pain    Status Achieved      PT SHORT TERM GOAL #2   Title The patient will report a 30% improvement in left UE and left LE paresthesia with sitting at  the computer and with basic ADLs    Time 6    Period Weeks    Status On-going      PT SHORT TERM GOAL #3   Time 6    Period Weeks    Status On-going               PT Long Term Goals - 11/29/20 1726       PT LONG TERM GOAL #1   Title Pt will be independent with his advanced HEP to improve ROM and  strength  with daily activity after d/c from PT.    Time 12    Period Weeks    Status New    Target Date 02/21/21      PT LONG TERM GOAL #2   Title Pt will report  at least 60% improvement in left UE and left LE parethesia with usual ADLs    Time 12    Period Weeks    Status New      PT LONG TERM GOAL #3   Title The patient will have grossly 4+/5 lumbar/pelvic/hip strength  needed for lifting cases of water    Time 12    Period Weeks    Status New      PT LONG TERM GOAL #4   Title FOTO functional outcome score improved to 65%    Time 12    Period Weeks    Status New                   Plan - 12/13/20 1350     Clinical Impression Statement The patient demonstrates good carryover with "3 points of contact" with hip hinging.  Some cues to  encourage more hip flexion early on to help with stooping lower.  Able to add weights 10# for partial dead lifting without symptom production.  Also able to perform moderate intensity core ex's without back or LE symptoms.  No increase in headache with  exercise this visit or last.  Therapist monitoring response throughout treatment session.    Comorbidities chronic history spinal pain, anxiety, depression, migraines    Examination-Activity Limitations Lift;Caring for Others;Locomotion Level;Sit;Sleep;Stand    Rehab Potential Good    PT Frequency 2x / week    PT Duration 12 weeks    PT Treatment/Interventions ADLs/Self Care Home Management;Electrical Stimulation;Cryotherapy;Aquatic Therapy;Moist Heat;Traction;Ultrasound;Neuromuscular re-education;Therapeutic exercise;Therapeutic activities;Patient/family education;Manual techniques;Dry needling;Taping;Spinal Manipulations;Iontophoresis '4mg'$ /ml Dexamethasone    PT Next Visit Plan review hip hinge with yard stick;  Lifting simulation; lumbo/pelvic/hip and LE strengthening;  recheck lumbar ROM and % improvement    PT Home Exercise Plan [facility]             Patient will benefit from skilled therapeutic intervention in order to improve the following deficits and impairments:  Decreased range of motion, Pain, Impaired perceived functional ability, Decreased strength, Postural dysfunction, Improper body mechanics, Increased muscle spasms, Decreased activity tolerance  Visit Diagnosis: Paresthesia of skin  Chronic bilateral low back pain, unspecified whether sciatica present     Problem List Patient Active Problem List   Diagnosis Date Noted   Caregiver burden 10/18/2020   Arthritis of right acromioclavicular joint 02/18/2020   Right rotator cuff tear 12/18/2019   Degenerative cervical disc 05/07/2019   Left lumbar radiculopathy 10/10/2017   Migraine without aura and without status migrainosus, not intractable 06/22/2017   Vitamin D  deficiency 05/28/2017   Scrotal pain 05/28/2017   Paroxysmal SVT (supraventricular tachycardia) ([place]) 05/28/2017   Mild left inguinal hernia  11/23/2016   Chronic pelvic pain in male 09/28/2016   Abdominal pain, left lower quadrant 09/28/2016   Isolated proteinuria without specific morphologic lesion 06/27/2016   Left testicular pain 06/27/2016   Acute left flank pain 06/27/2016   Epididymitis, left 06/05/2016   Pelvic floor dysfunction 04/01/2016   GERD (gastroesophageal reflux disease) 10/13/2015   Gastroparesis 10/13/2015   Intractable chronic migraine without aura and without status migrainosus 12/15/2013   Hyperlipidemia 04/04/2012   Mitral valve disease 07/15/2009   IRRITABLE BOWEL SYNDROME 07/15/2009   Palpitations 06/28/2009   Ruben Im, PT 12/13/20 2:01 PM Phone: [phone] Fax: [phone]  Alvera Singh 12/13/2020, 2:00 PM  [facility] [address], [place], [name], [address] Phone: [phone]   Fax:  [phone]  Name: William [name] MRN: [id number] Date of Birth: 04/11/1961

## 2020-12-16 ENCOUNTER — Encounter: Payer: PPO | Admitting: Physical Therapy

## 2020-12-19 ENCOUNTER — Ambulatory Visit: Payer: PPO | Admitting: Family Medicine

## 2020-12-20 ENCOUNTER — Encounter: Payer: PPO | Admitting: Physical Therapy

## 2020-12-20 DIAGNOSIS — J329 Chronic sinusitis, unspecified: Secondary | ICD-10-CM | POA: Diagnosis not present

## 2020-12-20 DIAGNOSIS — J0191 Acute recurrent sinusitis, unspecified: Secondary | ICD-10-CM | POA: Diagnosis not present

## 2020-12-20 DIAGNOSIS — G8929 Other chronic pain: Secondary | ICD-10-CM | POA: Diagnosis not present

## 2020-12-20 DIAGNOSIS — R519 Headache, unspecified: Secondary | ICD-10-CM | POA: Diagnosis not present

## 2020-12-20 DIAGNOSIS — J302 Other seasonal allergic rhinitis: Secondary | ICD-10-CM | POA: Diagnosis not present

## 2020-12-22 ENCOUNTER — Ambulatory Visit: Payer: PPO | Admitting: Physical Therapy

## 2020-12-22 ENCOUNTER — Other Ambulatory Visit: Payer: Self-pay

## 2020-12-22 DIAGNOSIS — G8929 Other chronic pain: Secondary | ICD-10-CM

## 2020-12-22 DIAGNOSIS — M545 Low back pain, unspecified: Secondary | ICD-10-CM

## 2020-12-22 DIAGNOSIS — R202 Paresthesia of skin: Secondary | ICD-10-CM

## 2020-12-22 NOTE — Therapy (Signed)
[facility] [address] [place], [name], [address] Phone: [phone]   Fax:  [phone]  Physical Therapy Treatment  Patient Details  Name: William [name] MRN: [id number] Date of Birth: 1961-04-10 Referring Provider (PT): Dr [name]   Encounter Date: 12/22/2020   PT End of Session - 12/22/20 2014     Visit Number 6    Date for PT Re-Evaluation 02/21/21    Authorization Type [employer]    PT Start Time 1013    PT Stop Time 1052    PT Time Calculation (min) 39 min    Activity Tolerance Patient tolerated treatment well             Past Medical History:  Diagnosis Date   Allergy    Anxiety    Depression    Gastroparesis    Heart murmur    Migraine headache    Mitral valve prolapse    SVT (supraventricular tachycardia) ([place])     No past surgical history on file.  There were no vitals filed for this visit.   Subjective Assessment - 12/22/20 1016     Subjective I've been referred to a headache specialist.  It's been bad.  My brother is taking my mom to her home in [place].  I tried to pick up something at home and had a sharp pain in my back.  Stacking crates.    Pertinent History supraspinatus tendinosis; mild A/C arthritis;  history of LBP;  anxiety,depression, migraines;  previous pt of BF [name], [place])    Patient Stated Goals get rid of this pain and function (I keep re-injuring myself with normal things like at [employer] lifting bottled water and cleaning tub lose balance with leaning in    Currently in Pain? Yes    Pain Score 1     Pain Location Back    Pain Type Chronic pain                               [facility] Adult PT Treatment/Exercise - 12/22/20 0001       Lumbar Exercises: Machines for Strengthening   Leg Press seat 7 60# 15x;  30# single leg press 15x each leg      Lumbar Exercises: Standing   Lifting Weights (lbs) 5# squat and press overhead 5x on  each side    Other Standing Lumbar Exercises 10# box overhead on/off and strategies for best performance    Other Standing Lumbar Exercises staggered stance 25# lat pull downs 20x                      PT Short Term Goals - 12/13/20 1357       PT SHORT TERM GOAL #1   Title Pt will be independent with his initial HEP for  reduction of paresthesia and pain    Status Achieved      PT SHORT TERM GOAL #2   Title The patient will report a 30% improvement in left UE and left LE paresthesia with sitting at the computer and with basic ADLs    Time 6    Period Weeks    Status On-going      PT SHORT TERM GOAL #3   Time 6    Period Weeks    Status On-going               PT Long Term  Goals - 11/29/20 1726       PT LONG TERM GOAL #1   Title Pt will be independent with his advanced HEP to improve ROM and  strength  with daily activity after d/c from PT.    Time 12    Period Weeks    Status New    Target Date 02/21/21      PT LONG TERM GOAL #2   Title Pt will report  at least 60% improvement in left UE and left LE parethesia with usual ADLs    Time 12    Period Weeks    Status New      PT LONG TERM GOAL #3   Title The patient will have grossly 4+/5 lumbar/pelvic/hip strength  needed for lifting cases of water    Time 12    Period Weeks    Status New      PT LONG TERM GOAL #4   Title FOTO functional outcome score improved to 65%    Time 12    Period Weeks    Status New                   Plan - 12/22/20 2014     Clinical Impression Statement The patient is limited by headache pain today vs. low back pain but does report pain with lifting a crate/box down from a high position at home this week.   Discussed strategies with 10# box lowering and is able to perform without pain using a staggered stance position.  Discussed use of hips/squat to add power to  overhead press.  He is able to perform all ex's without LBP.  Headache no worse/no better at the end  of the session.    Personal Factors and Comorbidities Comorbidity 1;Comorbidity 2;Comorbidity 3+;Past/Current Experience    Comorbidities chronic history spinal pain, anxiety, depression, migraines    Examination-Activity Limitations Lift;Caring for Others;Locomotion Level;Sit;Sleep;Stand    Examination-Participation Restrictions Interpersonal Relationship;Cleaning;Community Activity;Driving    Rehab Potential Good    PT Frequency 2x / week    PT Duration 12 weeks    PT Treatment/Interventions ADLs/Self Care Home Management;Electrical Stimulation;Cryotherapy;Aquatic Therapy;Moist Heat;Traction;Ultrasound;Neuromuscular re-education;Therapeutic exercise;Therapeutic activities;Patient/family education;Manual techniques;Dry needling;Taping;Spinal Manipulations;Iontophoresis '4mg'$ /ml Dexamethasone    PT Next Visit Plan review hip hinge with yard stick;  Lifting simulation; lumbo/pelvic/hip and LE strengthening;  recheck lumbar ROM and % improvement    PT Home Exercise Plan [facility]             Patient will benefit from skilled therapeutic intervention in order to improve the following deficits and impairments:  Decreased range of motion, Pain, Impaired perceived functional ability, Decreased strength, Postural dysfunction, Improper body mechanics, Increased muscle spasms, Decreased activity tolerance  Visit Diagnosis: Paresthesia of skin  Chronic bilateral low back pain, unspecified whether sciatica present     Problem List Patient Active Problem List   Diagnosis Date Noted   Caregiver burden 10/18/2020   Arthritis of right acromioclavicular joint 02/18/2020   Right rotator cuff tear 12/18/2019   Degenerative cervical disc 05/07/2019   Left lumbar radiculopathy 10/10/2017   Migraine without aura and without status migrainosus, not intractable 06/22/2017   Vitamin D deficiency 05/28/2017   Scrotal pain 05/28/2017   Paroxysmal SVT (supraventricular tachycardia) ([place]) 05/28/2017   Mild  left inguinal hernia 11/23/2016   Chronic pelvic pain in male 09/28/2016   Abdominal pain, left lower quadrant 09/28/2016   Isolated proteinuria without specific morphologic lesion 06/27/2016   Left testicular pain 06/27/2016  Acute left flank pain 06/27/2016   Epididymitis, left 06/05/2016   Pelvic floor dysfunction 04/01/2016   GERD (gastroesophageal reflux disease) 10/13/2015   Gastroparesis 10/13/2015   Intractable chronic migraine without aura and without status migrainosus 12/15/2013   Hyperlipidemia 04/04/2012   Mitral valve disease 07/15/2009   IRRITABLE BOWEL SYNDROME 07/15/2009   Palpitations 06/28/2009   Ruben Im, PT 12/22/20 8:21 PM Phone: [phone] Fax: [phone]  Alvera Singh 12/22/2020, 8:20 PM  [facility] [address], [place], [name], [address] Phone: [phone]   Fax:  [phone]  Name: William [name] MRN: [id number] Date of Birth: 1960/08/03

## 2020-12-27 ENCOUNTER — Other Ambulatory Visit: Payer: Self-pay

## 2020-12-27 ENCOUNTER — Ambulatory Visit: Payer: PPO | Admitting: Physical Therapy

## 2020-12-27 DIAGNOSIS — R202 Paresthesia of skin: Secondary | ICD-10-CM | POA: Diagnosis not present

## 2020-12-27 DIAGNOSIS — M545 Low back pain, unspecified: Secondary | ICD-10-CM

## 2020-12-27 NOTE — Therapy (Signed)
[facility] [address] [place], [name], [address] Phone: [phone]   Fax:  [phone]  Physical Therapy Treatment  Patient Details  Name: William [name] MRN: [id number] Date of Birth: [birth year] Referring Provider (PT): Dr [name]   Encounter Date: 12/27/2020   PT End of Session - 12/27/20 1020     Visit Number 7    Date for PT Re-Evaluation 02/21/21    Authorization Type [employer]    PT Start Time 0930    PT Stop Time 1015    PT Time Calculation (min) 45 min    Activity Tolerance Patient tolerated treatment well             Past Medical History:  Diagnosis Date   Allergy    Anxiety    Depression    Gastroparesis    Heart murmur    Migraine headache    Mitral valve prolapse    SVT (supraventricular tachycardia) ([place])     No past surgical history on file.  There were no vitals filed for this visit.   Subjective Assessment - 12/27/20 0936     Subjective I'm still wrestling with the headaches.  I lifted the crate on/off the stack without difficulty.  I would like to work on arm strength and leg strength.  No LE paresthesia in a while.    Pertinent History supraspinatus tendinosis; mild A/C arthritis;  history of LBP;  anxiety,depression, migraines;  previous pt of BF [name], [place])    Patient Stated Goals get rid of this pain and function (I keep re-injuring myself with normal things like at [employer] lifting bottled water and cleaning tub lose balance with leaning in    Currently in Pain? No/denies    Pain Score 0-No pain    Pain Location Back   migraine   Pain Type Chronic pain                [facility] PT Assessment - 12/27/20 0001       AROM   Lumbar Flexion 60    Lumbar Extension 25                           [facility] Adult PT Treatment/Exercise - 12/27/20 0001       Lumbar Exercises: Aerobic   UBE (Upper Arm Bike) L1 3x3      Lumbar Exercises:  Machines for Strengthening   Leg Press seat 7 70# 25x;  35# single leg press 15x each leg;  55# 5 reps each leg      Lumbar Exercises: Standing   Lifting Weights (lbs) 5# squat and press overhead 10x on each side    Other Standing Lumbar Exercises 10# box overhead on/off and strategies for best performance    Other Standing Lumbar Exercises stand to half kneel/lunge 6x right/Left with handles to assist      Shoulder Exercises: Standing   Other Standing Exercises push ups on medium height 10x                      PT Short Term Goals - 12/27/20 1008       PT SHORT TERM GOAL #1   Title Pt will be independent with his initial HEP for  reduction of paresthesia and pain    Status Achieved      PT SHORT TERM GOAL #2   Title The patient will report a 30%  improvement in left UE and left LE paresthesia with sitting at the computer and with basic ADLs    Status Achieved      PT SHORT TERM GOAL #3   Title The patient will demonstrate improved lumbar flexion to 50 degrees and cervical sidebending to 38 degrees bil needed for cleaning the tub and other household chores    Status Achieved               PT Long Term Goals - 12/27/20 1026       PT LONG TERM GOAL #1   Title Pt will be independent with his advanced HEP to improve ROM and  strength  with daily activity after d/c from PT.    Time 12    Period Weeks    Status On-going      PT LONG TERM GOAL #2   Title Pt will report  at least 60% improvement in left UE and left LE parethesia with usual ADLs    Time 12    Period Weeks    Status On-going      PT LONG TERM GOAL #3   Title The patient will have grossly 4+/5 lumbar/pelvic/hip strength  needed for lifting cases of water    Time 12    Period Weeks    Status On-going      PT LONG TERM GOAL #4   Title FOTO functional outcome score improved to 65%    Time 12    Period Weeks    Status On-going                   Plan - 12/27/20 1020     Clinical  Impression Statement The patient demonstrates good technique with 10# box lift from overhead height to waist to floor.  He has full and painless lumbar flexion and extension ROM.  He denies paresthesia in his LE lately.  His main complaint is ongoing headache but it has not been worsening with exercise.   Therapist closely monitoring response and for technique with lifting.  Will continue to work on lifting since this has been his primary mechanism of injury.    Personal Factors and Comorbidities Comorbidity 1;Comorbidity 2;Comorbidity 3+;Past/Current Experience    Comorbidities chronic history spinal pain, anxiety, depression, migraines    Examination-Activity Limitations Lift;Caring for Others;Locomotion Level;Sit;Sleep;Stand    Rehab Potential Good    PT Frequency 2x / week    PT Duration 12 weeks    PT Treatment/Interventions ADLs/Self Care Home Management;Electrical Stimulation;Cryotherapy;Aquatic Therapy;Moist Heat;Traction;Ultrasound;Neuromuscular re-education;Therapeutic exercise;Therapeutic activities;Patient/family education;Manual techniques;Dry needling;Taping;Spinal Manipulations;Iontophoresis '4mg'$ /ml Dexamethasone    PT Next Visit Plan  Lifting simulation; lumbo/pelvic/hip and LE strengthening   PT Home Exercise Plan [facility]             Patient will benefit from skilled therapeutic intervention in order to improve the following deficits and impairments:  Decreased range of motion, Pain, Impaired perceived functional ability, Decreased strength, Postural dysfunction, Improper body mechanics, Increased muscle spasms, Decreased activity tolerance  Visit Diagnosis: Paresthesia of skin  Chronic bilateral low back pain, unspecified whether sciatica present     Problem List Patient Active Problem List   Diagnosis Date Noted   Caregiver burden 10/18/2020   Arthritis of right acromioclavicular joint 02/18/2020   Right rotator cuff tear 12/18/2019   Degenerative cervical disc  05/07/2019   Left lumbar radiculopathy 10/10/2017   Migraine without aura and without status migrainosus, not intractable 06/22/2017   Vitamin D deficiency 05/28/2017   Scrotal  pain 05/28/2017   Paroxysmal SVT (supraventricular tachycardia) ([place]) 05/28/2017   Mild left inguinal hernia 11/23/2016   Chronic pelvic pain in male 09/28/2016   Abdominal pain, left lower quadrant 09/28/2016   Isolated proteinuria without specific morphologic lesion 06/27/2016   Left testicular pain 06/27/2016   Acute left flank pain 06/27/2016   Epididymitis, left 06/05/2016   Pelvic floor dysfunction 04/01/2016   GERD (gastroesophageal reflux disease) 10/13/2015   Gastroparesis 10/13/2015   Intractable chronic migraine without aura and without status migrainosus 12/15/2013   Hyperlipidemia 04/04/2012   Mitral valve disease 07/15/2009   IRRITABLE BOWEL SYNDROME 07/15/2009   Palpitations 06/28/2009   Ruben Im, PT 12/27/20 10:27 AM Phone: [phone] Fax: [phone]  Alvera Singh 12/27/2020, 10:27 AM  [facility] [address], [place], [name], [address] Phone: [phone]   Fax:  [phone]  Name: Tade Ostertag MRN: [id number] Date of Birth: Apr 05, 1961

## 2020-12-28 DIAGNOSIS — H04123 Dry eye syndrome of bilateral lacrimal glands: Secondary | ICD-10-CM | POA: Diagnosis not present

## 2020-12-28 DIAGNOSIS — E538 Deficiency of other specified B group vitamins: Secondary | ICD-10-CM | POA: Diagnosis not present

## 2020-12-29 ENCOUNTER — Encounter: Payer: PPO | Admitting: Physical Therapy

## 2021-01-03 ENCOUNTER — Other Ambulatory Visit: Payer: Self-pay

## 2021-01-03 ENCOUNTER — Ambulatory Visit: Payer: PPO | Attending: Family Medicine | Admitting: Physical Therapy

## 2021-01-03 ENCOUNTER — Encounter: Payer: PPO | Admitting: Physical Therapy

## 2021-01-03 DIAGNOSIS — G8929 Other chronic pain: Secondary | ICD-10-CM | POA: Insufficient documentation

## 2021-01-03 DIAGNOSIS — R202 Paresthesia of skin: Secondary | ICD-10-CM | POA: Insufficient documentation

## 2021-01-03 DIAGNOSIS — Z23 Encounter for immunization: Secondary | ICD-10-CM | POA: Diagnosis not present

## 2021-01-03 DIAGNOSIS — M545 Low back pain, unspecified: Secondary | ICD-10-CM | POA: Diagnosis not present

## 2021-01-03 NOTE — Therapy (Signed)
[facility] [address], [place], [name], [address] Phone: [phone]   Fax:  [phone]  Physical Therapy Treatment  Patient Details  Name: William [name] MRN: [id number] Date of Birth: [birth year] Referring Provider (PT): Dr [name]   Encounter Date: 01/03/2021   PT End of Session - 01/03/21 1200     Visit Number 8    Date for PT Re-Evaluation 02/21/21    Authorization Type [employer]    PT Start Time 0934    PT Stop Time [id number]    PT Time Calculation (min) 40 min    Activity Tolerance Patient tolerated treatment well             Past Medical History:  Diagnosis Date   Allergy    Anxiety    Depression    Gastroparesis    Heart murmur    Migraine headache    Mitral valve prolapse    SVT (supraventricular tachycardia) ([place])     No past surgical history on file.  There were no vitals filed for this visit.                      [facility] Adult PT Treatment/Exercise - 01/03/21 0001       Lumbar Exercises: Machines for Strengthening   Leg Press seat 7 90# 15x;  50# single leg press 15x each leg;  55# 5 reps each leg    Other Lumbar Machine Exercise seated lat bar 30# 20x      Lumbar Exercises: Standing   Functional Squats Limitations 5# snatch and press overhead 15x right/left    Lifting Weights (lbs) 10#    Lifting Limitations 15x plate weight    Row Power tower;20 reps    Row Limitations 35# staggered stance    Other Standing Lumbar Exercises 10# box overhead on/off and strategies for best performance    Other Standing Lumbar Exercises problem solve lifting bulky items      Knee/Hip Exercises: Standing   Other Standing Knee Exercises staggered stance power tower press out 30# 20x      Shoulder Exercises: Standing   Other Standing Exercises push ups on medium height 10x                      PT Short Term Goals - 12/27/20 1008       PT SHORT  TERM GOAL #1   Title Pt will be independent with his initial HEP for  reduction of paresthesia and pain    Status Achieved      PT SHORT TERM GOAL #2   Title The patient will report a 30% improvement in left UE and left LE paresthesia with sitting at the computer and with basic ADLs    Status Achieved      PT SHORT TERM GOAL #3   Title The patient will demonstrate improved lumbar flexion to 50 degrees and cervical sidebending to 38 degrees bil needed for cleaning the tub and other household chores    Status Achieved               PT Long Term Goals - 12/27/20 1026       PT LONG TERM GOAL #1   Title Pt will be independent with his advanced HEP to improve ROM and  strength  with daily activity after d/c from PT.    Time 12    Period Weeks    Status  On-going      PT LONG TERM GOAL #2   Title Pt will report  at least 60% improvement in left UE and left LE parethesia with usual ADLs    Time 12    Period Weeks    Status On-going      PT LONG TERM GOAL #3   Title The patient will have grossly 4+/5 lumbar/pelvic/hip strength  needed for lifting cases of water    Time 12    Period Weeks    Status On-going      PT LONG TERM GOAL #4   Title FOTO functional outcome score improved to 65%    Time 12    Period Weeks    Status On-going                   Plan - 01/03/21 1200     Clinical Impression Statement The patient demonstrates good technique with lifting from overhead and from the floor.  He reports some LBP this week when he had to move a long/awkward object.  Discussed strategies to do this and also the benefit of strengthening to build resiliency and tolerance for awkward lifting and positioning.  Able to increase resistance and load today and treatment focus on building an ex program he can replicate at the gym upon discharge.  Therapist monitoring ex technique and response throughout treatment session.    Personal Factors and Comorbidities Comorbidity  1;Comorbidity 2;Comorbidity 3+;Past/Current Experience    Comorbidities chronic history spinal pain, anxiety, depression, migraines    Rehab Potential Good    PT Frequency 2x / week    PT Duration 12 weeks    PT Treatment/Interventions ADLs/Self Care Home Management;Electrical Stimulation;Cryotherapy;Aquatic Therapy;Moist Heat;Traction;Ultrasound;Neuromuscular re-education;Therapeutic exercise;Therapeutic activities;Patient/family education;Manual techniques;Dry needling;Taping;Spinal Manipulations;Iontophoresis '4mg'$ /ml Dexamethasone    PT Next Visit Plan Lifting simulation; lumbo/pelvic/hip and LE strengthening;  lat bar, leg press, chest press; incline push ups    PT Home Exercise Plan [facility]             Patient will benefit from skilled therapeutic intervention in order to improve the following deficits and impairments:  Decreased range of motion, Pain, Impaired perceived functional ability, Decreased strength, Postural dysfunction, Improper body mechanics, Increased muscle spasms, Decreased activity tolerance  Visit Diagnosis: Paresthesia of skin  Chronic bilateral low back pain, unspecified whether sciatica present     Problem List Patient Active Problem List   Diagnosis Date Noted   Caregiver burden 10/18/2020   Arthritis of right acromioclavicular joint 02/18/2020   Right rotator cuff tear 12/18/2019   Degenerative cervical disc 05/07/2019   Left lumbar radiculopathy 10/10/2017   Migraine without aura and without status migrainosus, not intractable 06/22/2017   Vitamin D deficiency 05/28/2017   Scrotal pain 05/28/2017   Paroxysmal SVT (supraventricular tachycardia) ([place]) 05/28/2017   Mild left inguinal hernia 11/23/2016   Chronic pelvic pain in male 09/28/2016   Abdominal pain, left lower quadrant 09/28/2016   Isolated proteinuria without specific morphologic lesion 06/27/2016   Left testicular pain 06/27/2016   Acute left flank pain 06/27/2016   Epididymitis, left  06/05/2016   Pelvic floor dysfunction 04/01/2016   GERD (gastroesophageal reflux disease) 10/13/2015   Gastroparesis 10/13/2015   Intractable chronic migraine without aura and without status migrainosus 12/15/2013   Hyperlipidemia 04/04/2012   Mitral valve disease 07/15/2009   IRRITABLE BOWEL SYNDROME 07/15/2009   Palpitations 06/28/2009   Ruben Im, PT 01/03/21 12:06 PM Phone: [phone] Fax: [phone]   Alvera Singh 01/03/2021, 12:05 PM  [facility] [address], [place], [name], [address] Phone: [phone]   Fax:  [phone]  Name: Yasseen Grosz MRN: [id number] Date of Birth: 1960-09-03

## 2021-01-06 ENCOUNTER — Encounter: Payer: PPO | Admitting: Physical Therapy

## 2021-01-06 DIAGNOSIS — R110 Nausea: Secondary | ICD-10-CM | POA: Diagnosis not present

## 2021-01-06 DIAGNOSIS — E611 Iron deficiency: Secondary | ICD-10-CM | POA: Diagnosis not present

## 2021-01-06 DIAGNOSIS — H04123 Dry eye syndrome of bilateral lacrimal glands: Secondary | ICD-10-CM | POA: Diagnosis not present

## 2021-01-06 DIAGNOSIS — E538 Deficiency of other specified B group vitamins: Secondary | ICD-10-CM | POA: Diagnosis not present

## 2021-01-06 DIAGNOSIS — R5382 Chronic fatigue, unspecified: Secondary | ICD-10-CM | POA: Diagnosis not present

## 2021-01-06 DIAGNOSIS — R739 Hyperglycemia, unspecified: Secondary | ICD-10-CM | POA: Diagnosis not present

## 2021-01-06 DIAGNOSIS — E559 Vitamin D deficiency, unspecified: Secondary | ICD-10-CM | POA: Diagnosis not present

## 2021-01-09 ENCOUNTER — Telehealth: Payer: Self-pay | Admitting: Neurology

## 2021-01-09 DIAGNOSIS — M7541 Impingement syndrome of right shoulder: Secondary | ICD-10-CM | POA: Diagnosis not present

## 2021-01-09 DIAGNOSIS — R002 Palpitations: Secondary | ICD-10-CM | POA: Diagnosis not present

## 2021-01-09 DIAGNOSIS — N5082 Scrotal pain: Secondary | ICD-10-CM | POA: Diagnosis not present

## 2021-01-09 DIAGNOSIS — G43719 Chronic migraine without aura, intractable, without status migrainosus: Secondary | ICD-10-CM | POA: Diagnosis not present

## 2021-01-09 DIAGNOSIS — I471 Supraventricular tachycardia: Secondary | ICD-10-CM | POA: Diagnosis not present

## 2021-01-09 DIAGNOSIS — F438 Other reactions to severe stress: Secondary | ICD-10-CM | POA: Diagnosis not present

## 2021-01-09 DIAGNOSIS — F4323 Adjustment disorder with mixed anxiety and depressed mood: Secondary | ICD-10-CM | POA: Diagnosis not present

## 2021-01-09 DIAGNOSIS — D519 Vitamin B12 deficiency anemia, unspecified: Secondary | ICD-10-CM | POA: Diagnosis not present

## 2021-01-09 DIAGNOSIS — M6289 Other specified disorders of muscle: Secondary | ICD-10-CM | POA: Diagnosis not present

## 2021-01-09 DIAGNOSIS — N50812 Left testicular pain: Secondary | ICD-10-CM | POA: Diagnosis not present

## 2021-01-09 DIAGNOSIS — R102 Pelvic and perineal pain: Secondary | ICD-10-CM | POA: Diagnosis not present

## 2021-01-09 DIAGNOSIS — K3184 Gastroparesis: Secondary | ICD-10-CM | POA: Diagnosis not present

## 2021-01-09 NOTE — Telephone Encounter (Signed)
Called patient and left a message for a call back.  

## 2021-01-09 NOTE — Telephone Encounter (Signed)
Pt called in and he would like to know if there is anything jaffe can do to help him. He is having 5-6 migraines per week. They seem to be getting worse, and he would like to discuss his options

## 2021-01-10 ENCOUNTER — Encounter: Payer: PPO | Admitting: Physical Therapy

## 2021-01-11 NOTE — Telephone Encounter (Signed)
Called patient and left a message for a call back.  

## 2021-01-12 ENCOUNTER — Other Ambulatory Visit: Payer: Self-pay

## 2021-01-12 ENCOUNTER — Ambulatory Visit: Payer: PPO | Admitting: Physical Therapy

## 2021-01-12 DIAGNOSIS — R202 Paresthesia of skin: Secondary | ICD-10-CM

## 2021-01-12 DIAGNOSIS — M545 Low back pain, unspecified: Secondary | ICD-10-CM

## 2021-01-12 DIAGNOSIS — G8929 Other chronic pain: Secondary | ICD-10-CM

## 2021-01-12 NOTE — Therapy (Signed)
[facility] [address] [place], [name], [address] Phone: [phone]   Fax:  [phone]  Physical Therapy Treatment  Patient Details  Name: William [name] MRN: [id number] Date of Birth: 1961/03/30 Referring Provider (PT): Dr [name]   Encounter Date: 01/12/2021   PT End of Session - 01/12/21 1753     Visit Number 8    Date for PT Re-Evaluation 02/21/21    Authorization Type [employer]    PT Start Time 1400    PT Stop Time 1444    PT Time Calculation (min) 44 min    Activity Tolerance Patient tolerated treatment well             Past Medical History:  Diagnosis Date   Allergy    Anxiety    Depression    Gastroparesis    Heart murmur    Migraine headache    Mitral valve prolapse    SVT (supraventricular tachycardia) (HCC)     No past surgical history on file.  There were no vitals filed for this visit.   Subjective Assessment - 01/12/21 1359     Subjective Still cleaning to remedy mold issue at home.  Very slow and deliberate with lifting cases of water at [employer].  No pain.    Patient Stated Goals get rid of this pain and function (I keep re-injuring myself with normal things like at [employer] lifting bottled water and cleaning tub lose balance with leaning in    Currently in Pain? No/denies    Pain Score 0-No pain    Pain Location Back    Pain Type Chronic pain                               [facility] Adult PT Treatment/Exercise - 01/12/21 0001       Therapeutic Activites    Lifting pt demo method for loading and unloading case of water      Lumbar Exercises: Aerobic   UBE (Upper Arm Bike) L1 3x3      Lumbar Exercises: Machines for Strengthening   Leg Press seat 7 100# 15x; 60# single leg 15x each    Other Lumbar Machine Exercise seated lat bar 30# 20x      Lumbar Exercises: Standing   Functional Squats Limitations 10# snatch and press overhead 15x  right/left    Lifting Limitations 30# dead lift from knee level 25x    Other Standing Lumbar Exercises 25# power tower press out 20x                       PT Short Term Goals - 12/27/20 1008       PT SHORT TERM GOAL #1   Title Pt will be independent with his initial HEP for  reduction of paresthesia and pain    Status Achieved      PT SHORT TERM GOAL #2   Title The patient will report a 30% improvement in left UE and left LE paresthesia with sitting at the computer and with basic ADLs    Status Achieved      PT SHORT TERM GOAL #3   Title The patient will demonstrate improved lumbar flexion to 50 degrees and cervical sidebending to 38 degrees bil needed for cleaning the tub and other household chores    Status Achieved  PT Long Term Goals - 12/27/20 1026       PT LONG TERM GOAL #1   Title Pt will be independent with his advanced HEP to improve ROM and  strength  with daily activity after d/c from PT.    Time 12    Period Weeks    Status On-going      PT LONG TERM GOAL #2   Title Pt will report  at least 60% improvement in left UE and left LE parethesia with usual ADLs    Time 12    Period Weeks    Status On-going      PT LONG TERM GOAL #3   Title The patient will have grossly 4+/5 lumbar/pelvic/hip strength  needed for lifting cases of water    Time 12    Period Weeks    Status On-going      PT LONG TERM GOAL #4   Title FOTO functional outcome score improved to 65%    Time 12    Period Weeks    Status On-going                   Plan - 01/12/21 1753     Clinical Impression Statement The patient demonstrates excellent technique with showing his method for loading and unloading a case of water recently and is pleased this was not painful.  Progression of strengthening performed including 30# partial dead lifts with the bar.  He was able to perform this easily without back pain.  Treatment also focused on promoting independence  with gym type equipment for long term management.  Discussed decreasing treatment frequency to every other week if still doing well.    Comorbidities chronic history spinal pain, anxiety, depression, migraines    Examination-Participation Restrictions Interpersonal Relationship;Cleaning;Community Activity;Driving    Rehab Potential Good    PT Frequency 2x / week    PT Duration 12 weeks    PT Treatment/Interventions ADLs/Self Care Home Management;Electrical Stimulation;Cryotherapy;Aquatic Therapy;Moist Heat;Traction;Ultrasound;Neuromuscular re-education;Therapeutic exercise;Therapeutic activities;Patient/family education;Manual techniques;Dry needling;Taping;Spinal Manipulations;Iontophoresis '4mg'$ /ml Dexamethasone    PT Next Visit Plan Lifting 30# bar knee level; lumbo/pelvic/hip and LE strengthening;  lat bar, leg press, chest press; incline push ups    PT Home Exercise Plan [facility]             Patient will benefit from skilled therapeutic intervention in order to improve the following deficits and impairments:  Decreased range of motion, Pain, Impaired perceived functional ability, Decreased strength, Postural dysfunction, Improper body mechanics, Increased muscle spasms, Decreased activity tolerance  Visit Diagnosis: Paresthesia of skin  Chronic bilateral low back pain, unspecified whether sciatica present     Problem List Patient Active Problem List   Diagnosis Date Noted   Caregiver burden 10/18/2020   Arthritis of right acromioclavicular joint 02/18/2020   Right rotator cuff tear 12/18/2019   Degenerative cervical disc 05/07/2019   Left lumbar radiculopathy 10/10/2017   Migraine without aura and without status migrainosus, not intractable 06/22/2017   Vitamin D deficiency 05/28/2017   Scrotal pain 05/28/2017   Paroxysmal SVT (supraventricular tachycardia) ([place]) 05/28/2017   Mild left inguinal hernia 11/23/2016   Chronic pelvic pain in male 09/28/2016   Abdominal pain,  left lower quadrant 09/28/2016   Isolated proteinuria without specific morphologic lesion 06/27/2016   Left testicular pain 06/27/2016   Acute left flank pain 06/27/2016   Epididymitis, left 06/05/2016   Pelvic floor dysfunction 04/01/2016   GERD (gastroesophageal reflux disease) 10/13/2015   Gastroparesis 10/13/2015   Intractable  chronic migraine without aura and without status migrainosus 12/15/2013   Hyperlipidemia 04/04/2012   Mitral valve disease 07/15/2009   IRRITABLE BOWEL SYNDROME 07/15/2009   Palpitations 06/28/2009   Ruben Im, PT 01/12/21 5:58 PM Phone: [phone] Fax: [phone]  Alvera Singh, PT 01/12/2021, 5:57 PM  [facility] [address], [place], [name], [address] Phone: [phone]   Fax:  [phone]  Name: William [name] MRN: [id number] Date of Birth: 08/18/1960

## 2021-01-13 ENCOUNTER — Encounter: Payer: PPO | Admitting: Physical Therapy

## 2021-01-13 NOTE — Telephone Encounter (Signed)
Called patient and left a message for a call back.  

## 2021-01-16 ENCOUNTER — Ambulatory Visit: Payer: PPO | Attending: Internal Medicine

## 2021-01-16 DIAGNOSIS — Z23 Encounter for immunization: Secondary | ICD-10-CM

## 2021-01-16 NOTE — Progress Notes (Signed)
   Covid-19 Vaccination Clinic  Name:  William [name]    MRN: [id number] DOB: June 10, 1960  01/16/2021  [name] was observed post Covid-19 immunization for 15 minutes without incident. He was provided with Vaccine Information Sheet and instruction to access the V-Safe system.   [name] was instructed to call 911 with any severe reactions post vaccine: Difficulty breathing  Swelling of face and throat  A fast heartbeat  A bad rash all over body  Dizziness and weakness

## 2021-01-18 ENCOUNTER — Ambulatory Visit: Payer: PPO | Admitting: Family Medicine

## 2021-01-23 ENCOUNTER — Other Ambulatory Visit (HOSPITAL_BASED_OUTPATIENT_CLINIC_OR_DEPARTMENT_OTHER): Payer: Self-pay

## 2021-01-23 MED ORDER — COVID-19MRNA BIVAL VACC PFIZER 30 MCG/0.3ML IM SUSP
INTRAMUSCULAR | 0 refills | Status: DC
  Filled 2021-01-23: qty 0.3, 1d supply, fill #0

## 2021-01-31 ENCOUNTER — Other Ambulatory Visit: Payer: Self-pay

## 2021-01-31 ENCOUNTER — Ambulatory Visit: Payer: PPO | Attending: Family Medicine | Admitting: Physical Therapy

## 2021-01-31 DIAGNOSIS — M25511 Pain in right shoulder: Secondary | ICD-10-CM | POA: Insufficient documentation

## 2021-01-31 DIAGNOSIS — M545 Low back pain, unspecified: Secondary | ICD-10-CM | POA: Diagnosis not present

## 2021-01-31 DIAGNOSIS — M6281 Muscle weakness (generalized): Secondary | ICD-10-CM | POA: Insufficient documentation

## 2021-01-31 DIAGNOSIS — G8929 Other chronic pain: Secondary | ICD-10-CM | POA: Insufficient documentation

## 2021-01-31 DIAGNOSIS — R202 Paresthesia of skin: Secondary | ICD-10-CM | POA: Diagnosis not present

## 2021-01-31 DIAGNOSIS — R293 Abnormal posture: Secondary | ICD-10-CM | POA: Diagnosis not present

## 2021-01-31 DIAGNOSIS — M62838 Other muscle spasm: Secondary | ICD-10-CM | POA: Insufficient documentation

## 2021-01-31 DIAGNOSIS — M542 Cervicalgia: Secondary | ICD-10-CM | POA: Insufficient documentation

## 2021-01-31 NOTE — Therapy (Signed)
[facility] @ [place], [name], [address] Phone:     Fax:     Physical Therapy Treatment  Patient Details  Name: William [name] MRN: [id number] Date of Birth: 07-21-1960 Referring Provider (PT): Dr [name]  Progress Note Reporting Period 11/29/2020 to 01/31/2021   See note below for Objective Data and Assessment of Progress/Goals.     See note below for Objective Data and Assessment of Progress/Goals.     Encounter Date: 01/31/2021   PT End of Session - 01/31/21 1452     Visit Number 10   miscount last visit   Date for PT Re-Evaluation 02/21/21    Authorization Type [employer]    PT Start Time 1403    PT Stop Time 1449    PT Time Calculation (min) 46 min    Activity Tolerance Patient tolerated treatment well             Past Medical History:  Diagnosis Date   Allergy    Anxiety    Depression    Gastroparesis    Heart murmur    Migraine headache    Mitral valve prolapse    SVT (supraventricular tachycardia) (HCC)     No past surgical history on file.  There were no vitals filed for this visit.   Subjective Assessment - 01/31/21 1424     Subjective I saw something about working the abdominal "stabilizers"    Pertinent History supraspinatus tendinosis; mild A/C arthritis;  history of LBP;  anxiety,depression, migraines;  previous pt of BF [name], [place])    Patient Stated Goals get rid of this pain and function (I keep re-injuring myself with normal things like at [employer] lifting bottled water and cleaning tub lose balance with leaning in    Currently in Pain? No/denies    Pain Score 0-No pain    Pain Location Back                [facility] PT Assessment - 01/31/21 0001       Strength   Overall Strength Comments dec strength with single leg heel raise; hip abd/ext bil 4/5    Lumbar Flexion 4/5    Lumbar Extension 4/5                           [facility]  Adult PT Treatment/Exercise - 01/31/21 0001       Lumbar Exercises: Aerobic   UBE (Upper Arm Bike) L1 2x2      Lumbar Exercises: Machines for Strengthening   Cybex Lumbar Extension seated  low row Matrix bent 25# 10x    Leg Press --    Other Lumbar Machine Exercise hip machine 40# 15x flexion, abduction, extension  right/left    Other Lumbar Machine Exercise standing lat bar 45# pulldowns      Lumbar Exercises: Standing   Functional Squats Limitations 10# snatch and press overhead 15x right/left    Lifting Limitations 30# dead lift from knee level 25x                       PT Short Term Goals - 12/27/20 1008       PT SHORT TERM GOAL #1   Title Pt will be independent with his initial HEP for  reduction of paresthesia and pain    Status Achieved      PT SHORT TERM GOAL #2  Title The patient will report a 30% improvement in left UE and left LE paresthesia with sitting at the computer and with basic ADLs    Status Achieved      PT SHORT TERM GOAL #3   Title The patient will demonstrate improved lumbar flexion to 50 degrees and cervical sidebending to 38 degrees bil needed for cleaning the tub and other household chores    Status Achieved               PT Long Term Goals - 01/31/21 [place] #1   Title Pt will be independent with his advanced HEP to improve ROM and  strength  with daily activity after d/c from PT.    Time 12    Period Weeks    Status On-going    Target Date 02/21/21      PT LONG TERM GOAL #2   Title Pt will report  at least 60% improvement in left UE and left LE parethesia with usual ADLs    Time 12    Status Achieved      PT LONG TERM GOAL #3   Title The patient will have grossly 4+/5 lumbar/pelvic/hip strength  needed for lifting cases of water    Time 12    Period Weeks    Status Partially Met      PT LONG TERM GOAL #4   Title FOTO functional outcome score improved to 65%    Time 12    Period Weeks     Status On-going                   Plan - 01/31/21 1453     Clinical Impression Statement The patient has not had peripheral symptoms/paresthesia in the last several visits and back pain has not been present or very low in intensity.  We have focused on lifting technique since that has been an ongoing trigger for exacerbation (lifting cases of bottled water.)  He now demonstrates excellent technique with lifting from low as well as down from overhead with only min cues needed.    The patient demonstrates improving lumbo/pelvic/hip strength grossly 4/5 towards 4+/5.  Given his history of back pain chronicity, [facility].  He would benefit from 4-6 more visits to promote independence and confidence in a return to gym program.  He is on track to meet remaining goals in the next 3 weeks.    Comorbidities chronic history spinal pain, anxiety, depression, migraines    Examination-Activity Limitations Lift;Caring for Others;Locomotion Level;Sit;Sleep;Stand    Examination-Participation Restrictions Interpersonal Relationship;Cleaning;Community Activity;Driving    Rehab Potential Good    PT Frequency 2x / week    PT Duration 12 weeks    PT Treatment/Interventions ADLs/Self Care Home Management;Electrical Stimulation;Cryotherapy;Aquatic Therapy;Moist Heat;Traction;Ultrasound;Neuromuscular re-education;Therapeutic exercise;Therapeutic activities;Patient/family education;Manual techniques;Dry needling;Taping;Spinal Manipulations;Iontophoresis 38m/ml Dexamethasone    PT Next Visit Plan do FOTO next visit;  check single leg heel raise;   Lifting 30# bar knee level; lumbo/pelvic/hip and LE strengthening;  hip machine; seated bench row; lat bar, leg press, chest press; incline push ups    PT [facility]            Patient will benefit from skilled therapeutic intervention in order to improve the following deficits and  impairments:  Decreased range of motion, Pain, Impaired perceived functional ability, Decreased strength, Postural dysfunction, Improper body  mechanics, Increased muscle spasms, Decreased activity tolerance  Visit Diagnosis: Paresthesia of skin  Chronic bilateral low back pain, unspecified whether sciatica present     Problem List Patient Active Problem List   Diagnosis Date Noted   Caregiver burden 10/18/2020   Arthritis of right acromioclavicular joint 02/18/2020   Right rotator cuff tear 12/18/2019   Degenerative cervical disc 05/07/2019   Left lumbar radiculopathy 10/10/2017   Migraine without aura and without status migrainosus, not intractable 06/22/2017   Vitamin D deficiency 05/28/2017   Scrotal pain 05/28/2017   Paroxysmal SVT (supraventricular tachycardia) ([place]) 05/28/2017   Mild left inguinal hernia 11/23/2016   Chronic pelvic pain in male 09/28/2016   Abdominal pain, left lower quadrant 09/28/2016   Isolated proteinuria without specific morphologic lesion 06/27/2016   Left testicular pain 06/27/2016   Acute left flank pain 06/27/2016   Epididymitis, left 06/05/2016   Pelvic floor dysfunction 04/01/2016   GERD (gastroesophageal reflux disease) 10/13/2015   Gastroparesis 10/13/2015   Intractable chronic migraine without aura and without status migrainosus 12/15/2013   Hyperlipidemia 04/04/2012   Mitral valve disease 07/15/2009   IRRITABLE BOWEL SYNDROME 07/15/2009   Palpitations 06/28/2009   Ruben Im, PT 01/31/21 3:04 PM Phone: [phone] Fax: [phone]  Alvera Singh, PT 01/31/2021, 3:03 PM  [facility] @ [place], [name], [address] Phone:     Fax:     Name: Americo Vallery MRN: [id number] Date of Birth: 17-Feb-1961

## 2021-02-02 ENCOUNTER — Ambulatory Visit: Payer: PPO | Admitting: Physical Therapy

## 2021-02-02 ENCOUNTER — Encounter: Payer: PPO | Admitting: Physical Therapy

## 2021-02-02 ENCOUNTER — Other Ambulatory Visit: Payer: Self-pay

## 2021-02-02 DIAGNOSIS — R202 Paresthesia of skin: Secondary | ICD-10-CM

## 2021-02-02 DIAGNOSIS — M545 Low back pain, unspecified: Secondary | ICD-10-CM

## 2021-02-02 NOTE — Therapy (Signed)
[facility] @ [place], [name], [address] Phone: [phone]   Fax:  [phone]  Physical Therapy Treatment  Patient Details  Name: William [name] MRN: [id number] Date of Birth: 1961/04/22 Referring Provider (PT): Dr [name]   Encounter Date: 02/02/2021   PT End of Session - 02/02/21 1548     Visit Number 11    Date for PT Re-Evaluation 02/21/21    Authorization Type [employer]    PT Start Time 1532    PT Stop Time 1610    PT Time Calculation (min) 38 min    Activity Tolerance Patient tolerated treatment well             Past Medical History:  Diagnosis Date   Allergy    Anxiety    Depression    Gastroparesis    Heart murmur    Migraine headache    Mitral valve prolapse    SVT (supraventricular tachycardia) ([place])     No past surgical history on file.  There were no vitals filed for this visit.   Subjective Assessment - 02/02/21 1539     Subjective Did fine after last time.  Minimal soreness.    Pertinent History supraspinatus tendinosis; mild A/C arthritis;  history of LBP;  anxiety,depression, migraines;  previous pt of BF [name], [place])    Patient Stated Goals get rid of this pain and function (I keep re-injuring myself with normal things like at [employer] lifting bottled water and cleaning tub lose balance with leaning in    Currently in Pain? No/denies    Pain Score 0-No pain    Pain Location Back    Pain Type Chronic pain                               [facility] Adult PT Treatment/Exercise - 02/02/21 0001       Lumbar Exercises: Machines for Strengthening   Cybex Lumbar Extension seated  low row Matrix bent 25# 25x    Leg Press seat 7 100# 30x    Other Lumbar Machine Exercise hip machine 40# 15x flexion, abduction, extension  right/left    Other Lumbar Machine Exercise standing lat bar 45# pulldowns 20x      Lumbar Exercises: Standing   Functional  Squats Limitations --    Lifting Limitations 30# dead lift from knee level 25x      Lumbar Exercises: Prone   Other Prone Lumbar Exercises modified planks on knees 8x 5 sec hold      Lumbar Exercises: Quadruped   Other Quadruped Lumbar Exercises knee lift off mat isometric holds 5 sec 3x                       PT Short Term Goals - 12/27/20 1008       PT SHORT TERM GOAL #1   Title Pt will be independent with his initial HEP for  reduction of paresthesia and pain    Status Achieved      PT SHORT TERM GOAL #2   Title The patient will report a 30% improvement in left UE and left LE paresthesia with sitting at the computer and with basic ADLs    Status Achieved      PT SHORT TERM GOAL #3   Title The patient will demonstrate improved lumbar flexion to 50 degrees and cervical sidebending to 38 degrees  bil needed for cleaning the tub and other household chores    Status Achieved               PT Long Term Goals - 01/31/21 1453       PT LONG TERM GOAL #1   Title Pt will be independent with his advanced HEP to improve ROM and  strength  with daily activity after d/c from PT.    Time 12    Period Weeks    Status On-going    Target Date 02/21/21      PT LONG TERM GOAL #2   Title Pt will report  at least 60% improvement in left UE and left LE parethesia with usual ADLs    Time 12    Status Achieved      PT LONG TERM GOAL #3   Title The patient will have grossly 4+/5 lumbar/pelvic/hip strength  needed for lifting cases of water    Time 12    Period Weeks    Status Partially Met      PT LONG TERM GOAL #4   Title FOTO functional outcome score improved to 65%    Time 12    Period Weeks    Status On-going                   Plan - 02/02/21 1709     Clinical Impression Statement The patient is able to continue a steady progression of lumbo/pelvic/hip strengthening without production of LBP.  He demonstrates good technique with dead lifts.  He's highly  motivated to return to the gym however he has had set backs in the past and is fearful of "something going wrong" with his back.  Recommend 4-5 more visits to promote independence with his ex program for long term management of back pain.    Comorbidities chronic history spinal pain, anxiety, depression, migraines    Examination-Activity Limitations Lift;Caring for Others;Locomotion Level;Sit;Sleep;Stand    Rehab Potential Good    PT Frequency 2x / week    PT Duration 12 weeks    PT Treatment/Interventions ADLs/Self Care Home Management;Electrical Stimulation;Cryotherapy;Aquatic Therapy;Moist Heat;Traction;Ultrasound;Neuromuscular re-education;Therapeutic exercise;Therapeutic activities;Patient/family education;Manual techniques;Dry needling;Taping;Spinal Manipulations;Iontophoresis 7m/ml Dexamethasone    PT Next Visit Plan do FOTO next visit;  check single leg heel raise;   Lifting 30# bar knee level; lumbo/pelvic/hip and LE strengthening;  hip machine; seated bench row; lat bar, leg press, chest press; incline push ups    [facility]            Patient will benefit from skilled therapeutic intervention in order to improve the following deficits and impairments:  Decreased range of motion, Pain, Impaired perceived functional ability, Decreased strength, Postural dysfunction, Improper body mechanics, Increased muscle spasms, Decreased activity tolerance  Visit Diagnosis: Paresthesia of skin  Chronic bilateral low back pain, unspecified whether sciatica present     Problem List Patient Active Problem List   Diagnosis Date Noted   Caregiver burden 10/18/2020   Arthritis of right acromioclavicular joint 02/18/2020   Right rotator cuff tear 12/18/2019   Degenerative cervical disc 05/07/2019   Left lumbar radiculopathy 10/10/2017   Migraine without aura and without status migrainosus, not intractable 06/22/2017   Vitamin D deficiency 05/28/2017   Scrotal pain  05/28/2017   Paroxysmal SVT (supraventricular tachycardia) ([place] 05/28/2017   Mild left inguinal hernia 11/23/2016   Chronic pelvic pain in male 09/28/2016   Abdominal pain, left lower quadrant 09/28/2016   Isolated proteinuria  without specific morphologic lesion 06/27/2016   Left testicular pain 06/27/2016   Acute left flank pain 06/27/2016   Epididymitis, left 06/05/2016   Pelvic floor dysfunction 04/01/2016   GERD (gastroesophageal reflux disease) 10/13/2015   Gastroparesis 10/13/2015   Intractable chronic migraine without aura and without status migrainosus 12/15/2013   Hyperlipidemia 04/04/2012   Mitral valve disease 07/15/2009   IRRITABLE BOWEL SYNDROME 07/15/2009   Palpitations 06/28/2009   Ruben Im, PT 02/02/21 5:15 PM Phone: [phone] Fax: [phone]  Alvera Singh, PT 02/02/2021, 5:15 PM  [facility] @ [place], [name], [address] Phone: [phone]   Fax:  [phone]  Name: William [name] MRN: [id number] Date of Birth: 07/22/1960

## 2021-02-07 ENCOUNTER — Encounter: Payer: PPO | Admitting: Physical Therapy

## 2021-02-08 ENCOUNTER — Ambulatory Visit: Payer: PPO | Admitting: Physical Therapy

## 2021-02-08 ENCOUNTER — Other Ambulatory Visit: Payer: Self-pay

## 2021-02-08 ENCOUNTER — Encounter: Payer: Self-pay | Admitting: Physical Therapy

## 2021-02-08 DIAGNOSIS — R202 Paresthesia of skin: Secondary | ICD-10-CM | POA: Diagnosis not present

## 2021-02-08 DIAGNOSIS — G8929 Other chronic pain: Secondary | ICD-10-CM

## 2021-02-08 DIAGNOSIS — M6281 Muscle weakness (generalized): Secondary | ICD-10-CM

## 2021-02-08 DIAGNOSIS — M25511 Pain in right shoulder: Secondary | ICD-10-CM

## 2021-02-08 DIAGNOSIS — M545 Low back pain, unspecified: Secondary | ICD-10-CM

## 2021-02-08 DIAGNOSIS — M62838 Other muscle spasm: Secondary | ICD-10-CM

## 2021-02-08 DIAGNOSIS — M542 Cervicalgia: Secondary | ICD-10-CM

## 2021-02-08 NOTE — Therapy (Signed)
[facility] @ [place], [name], [address] Phone: [phone]   Fax:  [phone]  Physical Therapy Treatment  Patient Details  Name: William [name] MRN: [id number] Date of Birth: [birth year] Referring Provider (PT): Dr [name]   Encounter Date: 02/08/2021   PT End of Session - 02/08/21 1404     Visit Number 12    Date for PT Re-Evaluation 02/21/21    Authorization Type [employer]    PT Start Time 6606    PT Stop Time 1443    PT Time Calculation (min) 40 min             Past Medical History:  Diagnosis Date   Allergy    Anxiety    Depression    Gastroparesis    Heart murmur    Migraine headache    Mitral valve prolapse    SVT (supraventricular tachycardia) ([place])     History reviewed. No pertinent surgical history.  There were no vitals filed for this visit.   Subjective Assessment - 02/08/21 1408     Subjective No new complaints today.    Pertinent History supraspinatus tendinosis; mild A/C arthritis;  history of LBP;  anxiety,depression, migraines;  previous pt of BF [name], [place])    Currently in Pain? No/denies    Multiple Pain Sites No                               [facility] Adult PT Treatment/Exercise - 02/08/21 0001       Lumbar Exercises: Aerobic   UBE (Upper Arm Bike) L1 3x3      Lumbar Exercises: Machines for Strengthening   Leg Press seat 7 100# 30x    Other Lumbar Machine Exercise hip machine 40# 15x flexion, abduction, extension  right/left    Other Lumbar Machine Exercise standing lat bar 45# pulldowns 25x      Lumbar Exercises: Standing   Lifting Limitations 30# dead lift from knee level 2x15      Lumbar Exercises: Prone   Other Prone Lumbar Exercises Full forearm plank 2 x 10sec                       PT Short Term Goals - 12/27/20 1008       PT SHORT TERM GOAL #1   Title Pt will be independent with his initial  HEP for  reduction of paresthesia and pain    Status Achieved      PT SHORT TERM GOAL #2   Title The patient will report a 30% improvement in left UE and left LE paresthesia with sitting at the computer and with basic ADLs    Status Achieved      PT SHORT TERM GOAL #3   Title The patient will demonstrate improved lumbar flexion to 50 degrees and cervical sidebending to 38 degrees bil needed for cleaning the tub and other household chores    Status Achieved               PT Long Term Goals - 01/31/21 1453       PT LONG TERM GOAL #1   Title Pt will be independent with his advanced HEP to improve ROM and  strength  with daily activity after d/c from PT.    Time 12    Period Weeks    Status On-going  Target Date 02/21/21      PT LONG TERM GOAL #2   Title Pt will report  at least 60% improvement in left UE and left LE parethesia with usual ADLs    Time 12    Status Achieved      PT LONG TERM GOAL #3   Title The patient will have grossly 4+/5 lumbar/pelvic/hip strength  needed for lifting cases of water    Time 12    Period Weeks    Status Partially Met      PT LONG TERM GOAL #4   Title FOTO functional outcome score improved to 65%    Time 12    Period Weeks    Status On-going                   Plan - 02/08/21 1417     Clinical Impression Statement Pt arrives pain free. He reports having success at [facility] club picking up the case of water bottles. Pt able to perform all exercises with improved technique, still requires modifiecation with dead lift. No pain duirng or post exercise. Pt advanced plank to full forearm plank for 10 sec.    Personal Factors and Comorbidities Comorbidity 1;Comorbidity 2;Comorbidity 3+;Past/Current Experience    Comorbidities chronic history spinal pain, anxiety, depression, migraines    Examination-Activity Limitations Lift;Caring for Others;Locomotion Level;Sit;Sleep;Stand    Examination-Participation Restrictions Interpersonal  Relationship;Cleaning;Community Activity;Driving    Stability/Clinical Decision Making Stable/Uncomplicated    Rehab Potential Good    PT Frequency 2x / week    PT Duration 12 weeks    PT Treatment/Interventions ADLs/Self Care Home Management;Electrical Stimulation;Cryotherapy;Aquatic Therapy;Moist Heat;Traction;Ultrasound;Neuromuscular re-education;Therapeutic exercise;Therapeutic activities;Patient/family education;Manual techniques;Dry needling;Taping;Spinal Manipulations;Iontophoresis 72m/ml Dexamethasone    PT Next Visit Plan do FOTO next visit;  check single leg heel raise;   Lifting 30# bar knee level; lumbo/pelvic/hip and LE strengthening;  hip machine; seated bench row; lat bar, leg press, chest press; incline push ups    PT Home Exercise Plan [place] Agree with Plan of Care Patient             Patient will benefit from skilled therapeutic intervention in order to improve the following deficits and impairments:  Decreased range of motion, Pain, Impaired perceived functional ability, Decreased strength, Postural dysfunction, Improper body mechanics, Increased muscle spasms, Decreased activity tolerance  Visit Diagnosis: Paresthesia of skin  Chronic bilateral low back pain, unspecified whether sciatica present  Cervicalgia  Muscle weakness (generalized)  Other muscle spasm  Right shoulder pain, unspecified chronicity     Problem List Patient Active Problem List   Diagnosis Date Noted   Caregiver burden 10/18/2020   Arthritis of right acromioclavicular joint 02/18/2020   Right rotator cuff tear 12/18/2019   Degenerative cervical disc 05/07/2019   Left lumbar radiculopathy 10/10/2017   Migraine without aura and without status migrainosus, not intractable 06/22/2017   Vitamin D deficiency 05/28/2017   Scrotal pain 05/28/2017   Paroxysmal SVT ([place] 05/28/2017   Mild left inguinal hernia 11/23/2016   Chronic pelvic pain  in male 09/28/2016   Abdominal pain, left lower quadrant 09/28/2016   Isolated proteinuria without specific morphologic lesion 06/27/2016   Left testicular pain 06/27/2016   Acute left flank pain 06/27/2016   Epididymitis, left 06/05/2016   Pelvic floor dysfunction 04/01/2016   GERD (gastroesophageal reflux disease) 10/13/2015   Gastroparesis 10/13/2015   Intractable chronic migraine without aura and without status migrainosus 12/15/2013   Hyperlipidemia 04/04/2012  Mitral valve disease 07/15/2009   IRRITABLE BOWEL SYNDROME 07/15/2009   Palpitations 06/28/2009    Oronde Hallenbeck, PTA 02/08/2021, 2:44 PM  [facility] @ [place], [name], [address] Phone: [phone]   Fax:  [phone]  Name: William [name] MRN: [id number] Date of Birth: 1961/04/23

## 2021-02-09 ENCOUNTER — Encounter: Payer: PPO | Admitting: Physical Therapy

## 2021-02-13 ENCOUNTER — Encounter: Payer: PPO | Admitting: Physical Therapy

## 2021-02-14 ENCOUNTER — Encounter: Payer: PPO | Admitting: Physical Therapy

## 2021-02-15 DIAGNOSIS — K3184 Gastroparesis: Secondary | ICD-10-CM | POA: Diagnosis not present

## 2021-02-15 DIAGNOSIS — E559 Vitamin D deficiency, unspecified: Secondary | ICD-10-CM | POA: Diagnosis not present

## 2021-02-15 DIAGNOSIS — G43909 Migraine, unspecified, not intractable, without status migrainosus: Secondary | ICD-10-CM | POA: Diagnosis not present

## 2021-02-15 DIAGNOSIS — E538 Deficiency of other specified B group vitamins: Secondary | ICD-10-CM | POA: Diagnosis not present

## 2021-02-15 DIAGNOSIS — R5383 Other fatigue: Secondary | ICD-10-CM | POA: Diagnosis not present

## 2021-02-16 ENCOUNTER — Encounter: Payer: PPO | Admitting: Physical Therapy

## 2021-02-16 DIAGNOSIS — R5382 Chronic fatigue, unspecified: Secondary | ICD-10-CM | POA: Diagnosis not present

## 2021-02-16 DIAGNOSIS — D519 Vitamin B12 deficiency anemia, unspecified: Secondary | ICD-10-CM | POA: Diagnosis not present

## 2021-02-16 DIAGNOSIS — G43719 Chronic migraine without aura, intractable, without status migrainosus: Secondary | ICD-10-CM | POA: Diagnosis not present

## 2021-02-16 DIAGNOSIS — F4389 Other reactions to severe stress: Secondary | ICD-10-CM | POA: Diagnosis not present

## 2021-02-16 DIAGNOSIS — N5082 Scrotal pain: Secondary | ICD-10-CM | POA: Diagnosis not present

## 2021-02-16 DIAGNOSIS — N50812 Left testicular pain: Secondary | ICD-10-CM | POA: Diagnosis not present

## 2021-02-16 DIAGNOSIS — R102 Pelvic and perineal pain: Secondary | ICD-10-CM | POA: Diagnosis not present

## 2021-02-16 DIAGNOSIS — M6289 Other specified disorders of muscle: Secondary | ICD-10-CM | POA: Diagnosis not present

## 2021-02-16 DIAGNOSIS — F4323 Adjustment disorder with mixed anxiety and depressed mood: Secondary | ICD-10-CM | POA: Diagnosis not present

## 2021-02-16 DIAGNOSIS — R002 Palpitations: Secondary | ICD-10-CM | POA: Diagnosis not present

## 2021-02-16 DIAGNOSIS — M7541 Impingement syndrome of right shoulder: Secondary | ICD-10-CM | POA: Diagnosis not present

## 2021-02-16 DIAGNOSIS — I471 Supraventricular tachycardia: Secondary | ICD-10-CM | POA: Diagnosis not present

## 2021-02-17 ENCOUNTER — Other Ambulatory Visit: Payer: Self-pay

## 2021-02-17 ENCOUNTER — Ambulatory Visit: Payer: PPO | Admitting: Physical Therapy

## 2021-02-17 DIAGNOSIS — R5382 Chronic fatigue, unspecified: Secondary | ICD-10-CM | POA: Diagnosis not present

## 2021-02-17 DIAGNOSIS — N5082 Scrotal pain: Secondary | ICD-10-CM | POA: Diagnosis not present

## 2021-02-17 DIAGNOSIS — R102 Pelvic and perineal pain: Secondary | ICD-10-CM | POA: Diagnosis not present

## 2021-02-17 DIAGNOSIS — D519 Vitamin B12 deficiency anemia, unspecified: Secondary | ICD-10-CM | POA: Diagnosis not present

## 2021-02-17 DIAGNOSIS — F4389 Other reactions to severe stress: Secondary | ICD-10-CM | POA: Diagnosis not present

## 2021-02-17 DIAGNOSIS — M7541 Impingement syndrome of right shoulder: Secondary | ICD-10-CM | POA: Diagnosis not present

## 2021-02-17 DIAGNOSIS — M6281 Muscle weakness (generalized): Secondary | ICD-10-CM

## 2021-02-17 DIAGNOSIS — R293 Abnormal posture: Secondary | ICD-10-CM

## 2021-02-17 DIAGNOSIS — N50812 Left testicular pain: Secondary | ICD-10-CM | POA: Diagnosis not present

## 2021-02-17 DIAGNOSIS — R202 Paresthesia of skin: Secondary | ICD-10-CM | POA: Diagnosis not present

## 2021-02-17 DIAGNOSIS — M6289 Other specified disorders of muscle: Secondary | ICD-10-CM | POA: Diagnosis not present

## 2021-02-17 DIAGNOSIS — G43719 Chronic migraine without aura, intractable, without status migrainosus: Secondary | ICD-10-CM | POA: Diagnosis not present

## 2021-02-17 DIAGNOSIS — F4323 Adjustment disorder with mixed anxiety and depressed mood: Secondary | ICD-10-CM | POA: Diagnosis not present

## 2021-02-17 DIAGNOSIS — G8929 Other chronic pain: Secondary | ICD-10-CM

## 2021-02-17 DIAGNOSIS — I471 Supraventricular tachycardia: Secondary | ICD-10-CM | POA: Diagnosis not present

## 2021-02-17 DIAGNOSIS — R002 Palpitations: Secondary | ICD-10-CM | POA: Diagnosis not present

## 2021-02-17 NOTE — Therapy (Signed)
[facility] @ [place], [name], [address] Phone: [phone]   Fax:  [phone]  Physical Therapy Treatment  Patient Details  Name: William [name] MRN: [id number] Date of Birth: 04-27-1961 Referring Provider (PT): Dr [name]   Encounter Date: 02/17/2021   PT End of Session - 02/17/21 0804     Visit Number 13    Date for PT Re-Evaluation 02/21/21    Authorization Type [employer]    PT Start Time 0800    PT Stop Time 0845    PT Time Calculation (min) 45 min    Activity Tolerance Patient tolerated treatment well    Behavior During Therapy [facility] for tasks assessed/performed             Past Medical History:  Diagnosis Date   Allergy    Anxiety    Depression    Gastroparesis    Heart murmur    Migraine headache    Mitral valve prolapse    SVT (supraventricular tachycardia) ([place])     No past surgical history on file.  There were no vitals filed for this visit.   Subjective Assessment - 02/17/21 0803     Subjective Patient states he had a dental procedure yesterday and the dentist suggested he take it easy. Back and left side "doing ok".  "I just do everything slow".  Patient states he does not want to cause a flare up.    Pertinent History supraspinatus tendinosis; mild A/C arthritis;  history of LBP;  anxiety,depression, migraines;  previous pt of BF [name], [place])    How long can you sit comfortably? 30 min    How long can you walk comfortably? I do a lot of walking and I don't notice it as much    Patient Stated Goals get rid of this pain and function (I keep re-injuring myself with normal things like at [employer] lifting bottled water and cleaning tub lose balance with leaning in                               [facility] Adult PT Treatment/Exercise - 02/17/21 0001       Therapeutic Activites    Therapeutic Activities Other Therapeutic Activities   Sit to stand  with 20 lb kb 2 x 10   Other Therapeutic Activities Carrying task: farmers carry 10# plates x 2 laps around gym (180 feet)      Lumbar Exercises: Aerobic   UBE (Upper Arm Bike) L1.5 3x3    Nustep L2 x 10 min: seat 10      Lumbar Exercises: Machines for Strengthening   Other Lumbar Machine Exercise standing lat bar 45# x 10, then 50# x 10  pulldowns      Lumbar Exercises: Standing   Lifting Limitations 30# dead lift from knee level 2x15    Other Standing Lumbar Exercises Resisted fwd/backward walking with cable 15# x 5 laps      Lumbar Exercises: Prone   Other Prone Lumbar Exercises Full forearm plank 2 x 20sec                       PT Short Term Goals - 12/27/20 1008       PT SHORT TERM GOAL #1   Title Pt will be independent with his initial HEP for  reduction of paresthesia and pain    Status Achieved  PT SHORT TERM GOAL #2   Title The patient will report a 30% improvement in left UE and left LE paresthesia with sitting at the computer and with basic ADLs    Status Achieved      PT SHORT TERM GOAL #3   Title The patient will demonstrate improved lumbar flexion to 50 degrees and cervical sidebending to 38 degrees bil needed for cleaning the tub and other household chores    Status Achieved               PT Long Term Goals - 01/31/21 [place] #1   Title Pt will be independent with his advanced HEP to improve ROM and  strength  with daily activity after d/c from PT.    Time 12    Period Weeks    Status On-going    Target Date 02/21/21      PT LONG TERM GOAL #2   Title Pt will report  at least 60% improvement in left UE and left LE parethesia with usual ADLs    Time 12    Status Achieved      PT LONG TERM GOAL #3   Title The patient will have grossly 4+/5 lumbar/pelvic/hip strength  needed for lifting cases of water    Time 12    Period Weeks    Status Partially Met      PT LONG TERM GOAL #4   Title FOTO functional  outcome score improved to 65%    Time 12    Period Weeks    Status On-going                   Plan - 02/17/21 7939     Clinical Impression Statement Patient arrives with complaints following dental procedure.  He was able to complete all tasks without pain today with minimal vc's for correct technique. He continues to have some difficulty with heavier lifting and states he must do everything slowly and carefully.  He would benefit from continued skille PT for core strength and stability to allow him to perform routine lifting tasks without concern for exacerbation.    Personal Factors and Comorbidities Comorbidity 1;Comorbidity 2;Comorbidity 3+;Past/Current Experience    Comorbidities chronic history spinal pain, anxiety, depression, migraines    Examination-Activity Limitations Lift;Caring for Others;Locomotion Level;Sit;Sleep;Stand    Examination-Participation Restrictions Interpersonal Relationship;Cleaning;Community Activity;Driving    Stability/Clinical Decision Making Stable/Uncomplicated    Rehab Potential Good    PT Frequency 2x / week    PT Treatment/Interventions ADLs/Self Care Home Management;Electrical Stimulation;Cryotherapy;Aquatic Therapy;Moist Heat;Traction;Ultrasound;Neuromuscular re-education;Therapeutic exercise;Therapeutic activities;Patient/family education;Manual techniques;Dry needling;Taping;Spinal Manipulations;Iontophoresis 74m/ml Dexamethasone    PT Next Visit Plan do FOTO next visit;  Lifting 30# bar knee level; lumbo/pelvic/hip and LE strengthening;  hip machine; seated bench row; lat bar, leg press, chest press; incline push ups    PT Home Exercise Plan [place] Agree with Plan of Care Patient             Patient will benefit from skilled therapeutic intervention in order to improve the following deficits and impairments:  Decreased range of motion, Pain, Impaired perceived functional ability, Decreased strength, Postural  dysfunction, Improper body mechanics, Increased muscle spasms, Decreased activity tolerance  Visit Diagnosis: Chronic bilateral low back pain, unspecified whether sciatica present  Muscle weakness (generalized)  Abnormal posture     Problem List Patient Active Problem List   Diagnosis Date Noted  Caregiver burden 10/18/2020   Arthritis of right acromioclavicular joint 02/18/2020   Right rotator cuff tear 12/18/2019   Degenerative cervical disc 05/07/2019   Left lumbar radiculopathy 10/10/2017   Migraine without aura and without status migrainosus, not intractable 06/22/2017   Vitamin D deficiency 05/28/2017   Scrotal pain 05/28/2017   Paroxysmal SVT (supraventricular tachycardia) ([place]) 05/28/2017   Mild left inguinal hernia 11/23/2016   Chronic pelvic pain in male 09/28/2016   Abdominal pain, left lower quadrant 09/28/2016   Isolated proteinuria without specific morphologic lesion 06/27/2016   Left testicular pain 06/27/2016   Acute left flank pain 06/27/2016   Epididymitis, left 06/05/2016   Pelvic floor dysfunction 04/01/2016   GERD (gastroesophageal reflux disease) 10/13/2015   Gastroparesis 10/13/2015   Intractable chronic migraine without aura and without status migrainosus 12/15/2013   Hyperlipidemia 04/04/2012   Mitral valve disease 07/15/2009   IRRITABLE BOWEL SYNDROME 07/15/2009   Palpitations 06/28/2009    Anderson Malta B. Reisa Coppola, PT 10/21/228:43 AM   [facility] @ [place], [name], [address] Phone: [phone]   Fax:  [phone]  Name: William [name] MRN: [id number] Date of Birth: 10/27/1960

## 2021-02-17 NOTE — Progress Notes (Deleted)
[facility] [place] Phone: [phone] Subjective:    I'm seeing this patient by the request  of:  Shawnee Knapp, MD  CC:   [id number]  10/18/2020 Somewhat concerned that patient's foot pain is secondary to more of the lumbar radiculopathy.  Patient has been stable for some time.  Discussed x-rays today to further evaluate.  May need the possibility of epidurals again.  Mild positive straight leg test noted today.  We will get a possible peripheral neuropathy work-up as well.  Has had significant low vitamin D and will consider supplementation based on the labs.  Follow-up with me again 4 to 5 weeks otherwise.  Patient does bring up caregiver burden.  Patient has been the primary caregiver for his mom for quite some time.  Has not been able to focus on himself.  Patient is willing to talk to behavioral health and referral placed today.  Full range of motion at this point.  Doing very well.  No significant difficulties.  Follow-up again 5 to 6 weeks  Update 02/28/2021 Joseguadalupe Stan is a 60 y.o. male coming in with complaint of LBP. Patient states      Past Medical History:  Diagnosis Date   Allergy    Anxiety    Depression    Gastroparesis    Heart murmur    Migraine headache    Mitral valve prolapse    SVT (supraventricular tachycardia) ([facility])    No past surgical history on file. Social History   Socioeconomic History   Marital status: Married    Spouse name: Not on file   Number of children: 0   Years of education: Not on file   Highest education level: Bachelor's degree (e.g., BA, AB, BS)  Occupational History   Not on file  Tobacco Use   Smoking status: Never   Smokeless tobacco: Never  Vaping Use   Vaping Use: Never used  Substance and Sexual Activity   Alcohol use: No    Alcohol/week: 0.0 standard drinks   Drug use: No   Sexual activity: Yes    Partners: Female  Other Topics Concern    Not on file  Social History Narrative   Pt is married lives with spouse in 1 story apartment he has No children   BS degree- he is right handed   Drinks soda  Ginger ale, no coffee no tea   Right handed   Social Determinants of Radio broadcast assistant Strain: Not on file  Food Insecurity: Not on file  Transportation Needs: Not on file  Physical Activity: Not on file  Stress: Not on file  Social Connections: Not on file   Allergies  Allergen Reactions   Erythromycin Anaphylaxis and Nausea And Vomiting   Nalbuphine Other (See Comments)    States loss of consciousness    Cefdinir Diarrhea, Nausea And Vomiting and Rash   Cephalexin Diarrhea and Nausea And Vomiting   Cephalosporins Diarrhea and Nausea And Vomiting   Hydrocodone-Acetaminophen Nausea And Vomiting   Ketorolac Nausea And Vomiting   Prochlorperazine Edisylate Other (See Comments)    uncontrollable shake - tremor    Restasis [Cyclosporine] Other (See Comments)    migraines   Tyloxapol Swelling   Propranolol Diarrhea   Topamax [Topiramate] Diarrhea and Nausea And Vomiting   Zetia [Ezetimibe] Diarrhea   Doxycycline Hyclate Nausea Only and Other (See Comments)    abd pain   Iodinated Diagnostic Agents Other (  See Comments)    Lightheaded, flushed feeling (explained to patient these are normal side effects of IV iodinated contrast, not allergies, but he wanted this noted in the epic; he tolerated epidural steroid injections w/ contrast w/o difficulty)   Ketoconazole Itching   Latex Itching   Neosporin [Neomycin-Bacitracin Zn-Polymyx] Rash   Oxycodone-Acetaminophen Other (See Comments)    Tylox caused constipation   Prednisone Nausea And Vomiting    Oral route, only   Family History  Problem Relation Age of Onset   Hypertension Mother    Hypertension Brother    Kidney failure Maternal Grandmother    Prostate cancer Neg Hx    Kidney cancer Neg Hx      Current Outpatient Medications (Cardiovascular):     diltiazem (CARDIZEM CD) 180 MG 24 hr capsule, Take 1 capsule (180 mg total) by mouth daily.   diltiazem (CARDIZEM) 30 MG tablet, TAKE (1) TABLET AS NEEDED FOR PALPITATIONS  Current Outpatient Medications (Respiratory):    fluticasone (FLONASE) 50 MCG/ACT nasal spray, Place 2 sprays into both nostrils daily.  Current Outpatient Medications (Analgesics):    EMGALITY 120 MG/ML SOAJ,    Rimegepant Sulfate (NURTEC) 75 MG TBDP, Take 75 mg by mouth as needed for up to 1 dose (Maximum 1 tablet in 24 hours.).   Current Outpatient Medications (Other):    baclofen (LIORESAL) 10 MG tablet, Take 10 mg by mouth 3 (three) times daily.   COVID-19 mRNA bivalent vaccine, Pfizer, injection, Inject into the muscle.   COVID-19 mRNA vaccine, Pfizer, 30 MCG/0.3ML injection, INJECT AS DIRECTED   metoCLOPramide (REGLAN) 5 MG tablet, Take 5 mg by mouth as needed.   ondansetron (ZOFRAN) 4 MG tablet, Take 4 mg by mouth as needed.   Reviewed prior external information including notes and imaging from  primary care provider As well as notes that were available from care everywhere and other healthcare systems.  Past medical history, social, surgical and family history all reviewed in electronic medical record.  No pertanent information unless stated regarding to the chief complaint.   Review of Systems:  No headache, visual changes, nausea, vomiting, diarrhea, constipation, dizziness, abdominal pain, skin rash, fevers, chills, night sweats, weight loss, swollen lymph nodes, body aches, joint swelling, chest pain, shortness of breath, mood changes. POSITIVE muscle aches  Objective  There were no vitals taken for this visit.   General: No apparent distress alert and oriented x3 mood and affect normal, dressed appropriately.  HEENT: Pupils equal, extraocular movements intact  Respiratory: Patient's speak in full sentences and does not appear short of breath  Cardiovascular: No lower extremity edema, non tender, no  erythema  Gait normal with good balance and coordination.  MSK:  Non tender with full range of motion and good stability and symmetric strength and tone of shoulders, elbows, wrist, hip, knee and ankles bilaterally.     Impression and Recommendations:     The above documentation has been reviewed and is accurate and complete Jacqualin Combes

## 2021-02-20 DIAGNOSIS — F4323 Adjustment disorder with mixed anxiety and depressed mood: Secondary | ICD-10-CM | POA: Diagnosis not present

## 2021-02-20 DIAGNOSIS — R5382 Chronic fatigue, unspecified: Secondary | ICD-10-CM | POA: Diagnosis not present

## 2021-02-20 DIAGNOSIS — D519 Vitamin B12 deficiency anemia, unspecified: Secondary | ICD-10-CM | POA: Diagnosis not present

## 2021-02-20 DIAGNOSIS — M6289 Other specified disorders of muscle: Secondary | ICD-10-CM | POA: Diagnosis not present

## 2021-02-20 DIAGNOSIS — I471 Supraventricular tachycardia: Secondary | ICD-10-CM | POA: Diagnosis not present

## 2021-02-20 DIAGNOSIS — N50812 Left testicular pain: Secondary | ICD-10-CM | POA: Diagnosis not present

## 2021-02-20 DIAGNOSIS — N5082 Scrotal pain: Secondary | ICD-10-CM | POA: Diagnosis not present

## 2021-02-20 DIAGNOSIS — G43719 Chronic migraine without aura, intractable, without status migrainosus: Secondary | ICD-10-CM | POA: Diagnosis not present

## 2021-02-20 DIAGNOSIS — M7541 Impingement syndrome of right shoulder: Secondary | ICD-10-CM | POA: Diagnosis not present

## 2021-02-20 DIAGNOSIS — F4389 Other reactions to severe stress: Secondary | ICD-10-CM | POA: Diagnosis not present

## 2021-02-20 DIAGNOSIS — R102 Pelvic and perineal pain: Secondary | ICD-10-CM | POA: Diagnosis not present

## 2021-02-20 DIAGNOSIS — R002 Palpitations: Secondary | ICD-10-CM | POA: Diagnosis not present

## 2021-02-21 ENCOUNTER — Ambulatory Visit: Payer: PPO | Admitting: Physical Therapy

## 2021-02-23 ENCOUNTER — Telehealth: Payer: Self-pay

## 2021-02-23 ENCOUNTER — Ambulatory Visit: Payer: Self-pay | Admitting: *Deleted

## 2021-02-23 NOTE — Telephone Encounter (Signed)
Call received from [facility] from call center in patient billing, customer service to request correction in patient chart regarding incorrect DOB. Deana reports patient is calling in giving different DOB of 07/14/1960 than what is in chart 10/08/1964 and insurance is denying claim and needs to be corrected DOB sent to [employer] to customer service. [phone] follow prompts for provider and additional information and add on . Call back # for [employer] is [phone] for additional questions. Two charts have been created with two different MRN and different DOB. [employer] , Deana reports correct DOB is 11/14/1960.   Answer Assessment - Initial Assessment Questions 1. REASON FOR CALL or QUESTION: "What is your reason for calling today?" or "How can I best help you?" or "What question do you have that I can help answer?"     Deana from [facility] calling to request correction of patient demographics , DOB to be corrected in chart .  Protocols used: Information Only Call - No Triage-A-AH

## 2021-02-23 NOTE — Telephone Encounter (Signed)
[facility] agent transferred call from [place] "about a COVID 19 requisition." Call dropped.

## 2021-02-24 ENCOUNTER — Ambulatory Visit: Payer: Self-pay | Admitting: *Deleted

## 2021-02-24 NOTE — Telephone Encounter (Signed)
Attempted to contact patient to verify DOB. No answer on [phone]. Left message to call clinic back at [phone].

## 2021-02-24 NOTE — Telephone Encounter (Signed)
Called patient to verify DOB for demographic corrections. No answer, left message for patient to call back at [phone].  Call received on 02/23/21 from [facility] ,at  [employer] from call center in patient billing, customer service to request correction in patient chart regarding incorrect DOB. Deana reports patient is calling in giving different DOB of [birth year] than what is listed in chart as DOB 10/08/1964 and insurance is denying claim and needs to be corrected DOB and information called to [employer] to customer service. [phone] follow prompts for provider and choose additional information and add on . Call back # for [employer] is [phone] for additional questions. Two charts have been created with two different MRN and different DOB. [employer] , Deana reports correct DOB is [birth year]. MRN: [id number] has DOB listed as 10/08/1964 and  MRN: [id number] has DOB listed as [birth year]. Order written by Dr. Tia Masker  for lab test in April 2022. Please assist with verification of correct DOB and call # [phone] following the prompts as listed in above note. Patient's insurance is denying claim and [employer] [facility] needs verification of correct DOB.  Thank you.

## 2021-02-27 ENCOUNTER — Telehealth: Payer: Self-pay | Admitting: *Deleted

## 2021-02-27 NOTE — Telephone Encounter (Signed)
Contacted patient to verify DOB due to incorrect information on DOB . Patient verified DOB May 07, 1960. Patient verified  correct street address and health insurance. Please contact [facility] to verify order from Dr. Joya Gaskins in Aprill 2022 for a test and call # [phone]  following the prompts for provider information  and additional information and add on. Patient's insurance is denying claim and [place] would like a call back that correct patient chart has correct DOB to [employer] claim. For further questions please contact billing , customer service [phone]. Thank you.

## 2021-02-28 ENCOUNTER — Ambulatory Visit: Payer: PPO | Admitting: Family Medicine

## 2021-03-03 ENCOUNTER — Other Ambulatory Visit: Payer: Self-pay

## 2021-03-03 ENCOUNTER — Ambulatory Visit: Payer: PPO | Attending: Family Medicine | Admitting: Physical Therapy

## 2021-03-03 DIAGNOSIS — G8929 Other chronic pain: Secondary | ICD-10-CM | POA: Diagnosis not present

## 2021-03-03 DIAGNOSIS — M545 Low back pain, unspecified: Secondary | ICD-10-CM | POA: Diagnosis not present

## 2021-03-03 DIAGNOSIS — M6281 Muscle weakness (generalized): Secondary | ICD-10-CM | POA: Diagnosis not present

## 2021-03-03 NOTE — Therapy (Signed)
[facility] [place], [name], [address] Phone: [phone]   Fax:  [phone]  Physical Therapy Treatment/Recertification/Discharge Summary   Patient Details  Name: William [name] MRN: [id number] Date of Birth: Jan 27, 1961 Referring Provider (PT): Dr [name]   Encounter Date: 03/03/2021   PT End of Session - 03/03/21 1203     Visit Number 14    Date for PT Re-Evaluation 03/03/21    Authorization Type [employer]    PT Start Time 1100    PT Stop Time 1145    PT Time Calculation (min) 45 min    Activity Tolerance Patient tolerated treatment well             Past Medical History:  Diagnosis Date   Allergy    Anxiety    Depression    Gastroparesis    Heart murmur    Migraine headache    Mitral valve prolapse    SVT (supraventricular tachycardia) ([place])     No past surgical history on file.  There were no vitals filed for this visit.   Subjective Assessment - 03/03/21 1110     Subjective I'm doing well.  I have to think about my lifting technique and move carefully. No more LE tingling in a while.  Cleaned bathtub yesterday.    Patient Stated Goals get rid of this pain and function (I keep re-injuring myself with normal things like at [employer] lifting bottled water and cleaning tub lose balance with leaning in    Currently in Pain? No/denies    Pain Score 0-No pain                [facility] PT Assessment - 03/03/21 0001       Observation/Other Assessments   Focus on Therapeutic Outcomes (FOTO)  91%      AROM   Lumbar Flexion 50    Lumbar Extension 30    Lumbar - Right Side Bend 35    Lumbar - Left Side Bend 35      Strength   Overall Strength Comments hips abductor and extensors 5/5    Lumbar Flexion 4+/5    Lumbar Extension 4+/5                           [facility] Adult PT Treatment/Exercise - 03/03/21 0001       Therapeutic Activites    Other  Therapeutic Activities review of squat/lift      Lumbar Exercises: Machines for Strengthening   Other Lumbar Machine Exercise [facility] that [facility] be [facility]    Other Lumbar Machine Exercise standing lat bar 45# pulldowns 25x      Lumbar Exercises: Standing   Other Standing Lumbar Exercises symmetrical SLS and heel raises      Lumbar Exercises: Quadruped   Opposite Arm/Leg Raise Left arm/Right leg;Right arm/Left leg;5 reps    Plank 1x on toes; 1x on knees                       PT Short Term Goals - 03/03/21 1208       PT SHORT TERM GOAL #1   Title Pt will be independent with his initial HEP for  reduction of paresthesia and pain    Status Achieved      PT SHORT TERM GOAL #2   Title The patient will report a 30%  improvement in left UE and left LE paresthesia with sitting at the computer and with basic ADLs    Status Achieved      PT SHORT TERM GOAL #3   Title The patient will demonstrate improved lumbar flexion to 50 degrees and cervical sidebending to 38 degrees bil needed for cleaning the tub and other household chores    Status Achieved      PT SHORT TERM GOAL #4   Title FOTO score improved from 50% to 58%    Status Achieved               PT Long Term Goals - 03/03/21 1127       PT LONG TERM GOAL #1   Title Pt will be independent with his advanced HEP to improve ROM and  strength  with daily activity after d/c from PT.    Status Achieved      PT LONG TERM GOAL #2   Title Pt will report  at least 60% improvement in left UE and left LE parethesia with usual ADLs    Status Achieved      PT LONG TERM GOAL #3   Title The patient will have grossly 4+/5 lumbar/pelvic/hip strength  needed for lifting cases of water    Status Achieved      PT LONG TERM GOAL #4   Title FOTO functional outcome score improved to 65%    Status Achieved                   Plan - 03/03/21 1204     Clinical Impression Statement The  patient reports an overall improvement at 95%.  He has full and painfree lumbar ROM.  No peripheral symptoms in several weeks.  Lumbo/pelvic/hip strength grossly 4+/5 to 5/5.  He demonstrates good technique with lifting from floor and with lowering items from overhead.  We have discussed body mechanics principles and a progression to ex at the [facility].  FOTO functional outcome score dramatically improved from 51% to 90%.  All rehab goals have been met.    Personal Factors and Comorbidities Comorbidity 1;Comorbidity 2;Comorbidity 3+;Past/Current Experience    Comorbidities chronic history spinal pain, anxiety, depression, migraines    Examination-Activity Limitations Lift;Caring for Others;Locomotion Level;Sit;Sleep;Stand    Rehab Potential Good    PT Frequency 2x / week    PT Duration 12 weeks    PT Treatment/Interventions ADLs/Self Care Home Management;Electrical Stimulation;Cryotherapy;Aquatic Therapy;Moist Heat;Traction;Ultrasound;Neuromuscular re-education;Therapeutic exercise;Therapeutic activities;Patient/family education;Manual techniques;Dry needling;Taping;Spinal Manipulations;Iontophoresis 28m/ml Dexamethasone    PT Next Visit Plan discharge visit             Patient will benefit from skilled therapeutic intervention in order to improve the following deficits and impairments:  Decreased range of motion, Pain, Impaired perceived functional ability, Decreased strength, Postural dysfunction, Improper body mechanics, Increased muscle spasms, Decreased activity tolerance  Visit Diagnosis: Chronic bilateral low back pain, unspecified whether sciatica present - Plan: PT plan of care cert/re-cert  Muscle weakness (generalized) - Plan: PT plan of care cert/re-cert   PHYSICAL THERAPY DISCHARGE SUMMARY  Visits from Start of Care: 14  Current functional level related to goals / functional outcomes: See clinical impressions above   Remaining deficits: As above   Education /  Equipment: HEP   Patient agrees to discharge. Patient goals were met. Patient is being discharged due to meeting the stated rehab goals.   Problem List Patient Active Problem List   Diagnosis Date Noted   Caregiver burden 10/18/2020  Arthritis of right acromioclavicular joint 02/18/2020   Right rotator cuff tear 12/18/2019   Degenerative cervical disc 05/07/2019   Left lumbar radiculopathy 10/10/2017   Migraine without aura and without status migrainosus, not intractable 06/22/2017   Vitamin D deficiency 05/28/2017   Scrotal pain 05/28/2017   Paroxysmal SVT (supraventricular tachycardia) ([place]) 05/28/2017   Mild left inguinal hernia 11/23/2016   Chronic pelvic pain in male 09/28/2016   Abdominal pain, left lower quadrant 09/28/2016   Isolated proteinuria without specific morphologic lesion 06/27/2016   Left testicular pain 06/27/2016   Acute left flank pain 06/27/2016   Epididymitis, left 06/05/2016   Pelvic floor dysfunction 04/01/2016   GERD (gastroesophageal reflux disease) 10/13/2015   Gastroparesis 10/13/2015   Intractable chronic migraine without aura and without status migrainosus 12/15/2013   Hyperlipidemia 04/04/2012   Mitral valve disease 07/15/2009   IRRITABLE BOWEL SYNDROME 07/15/2009   Palpitations 06/28/2009   Ruben Im, PT 03/03/21 12:11 PM Phone: [phone] Fax: [phone]   Alvera Singh, PT 03/03/2021, 12:10 PM  [facility] [place], [name], [address] Phone: [phone]   Fax:  [phone]  Name: Enes Rokosz MRN: [id number] Date of Birth: [birth year]

## 2021-03-29 DIAGNOSIS — R5383 Other fatigue: Secondary | ICD-10-CM | POA: Diagnosis not present

## 2021-03-29 DIAGNOSIS — E559 Vitamin D deficiency, unspecified: Secondary | ICD-10-CM | POA: Diagnosis not present

## 2021-03-29 DIAGNOSIS — E291 Testicular hypofunction: Secondary | ICD-10-CM | POA: Diagnosis not present

## 2021-03-29 DIAGNOSIS — K5900 Constipation, unspecified: Secondary | ICD-10-CM | POA: Diagnosis not present

## 2021-03-31 DIAGNOSIS — R5383 Other fatigue: Secondary | ICD-10-CM | POA: Diagnosis not present

## 2021-03-31 DIAGNOSIS — E291 Testicular hypofunction: Secondary | ICD-10-CM | POA: Diagnosis not present

## 2021-03-31 DIAGNOSIS — E559 Vitamin D deficiency, unspecified: Secondary | ICD-10-CM | POA: Diagnosis not present

## 2021-03-31 DIAGNOSIS — K5900 Constipation, unspecified: Secondary | ICD-10-CM | POA: Diagnosis not present

## 2021-04-04 ENCOUNTER — Ambulatory Visit: Payer: Self-pay | Admitting: *Deleted

## 2021-04-04 NOTE — Telephone Encounter (Signed)
Received call from patient to request NT call [facility] to verify correct DOB as previously requested on 02/27/21. Patient reports [name] account shows he has an outstanding bill $43  and uninsured discount. Reviewed with patient NT can contact [facility] to verify DOB but will have to submit information regarding charge to another resource. Patient verbalized understanding and requesting a call back from NT when [employer] notified so his insurance can be refiled by [employer] for the test Dr. Joya Gaskins ordered in April 2022.

## 2021-04-04 NOTE — Telephone Encounter (Signed)
[employer] called and spoke to [employer], Therapist, art Rep in Patient Billing about the incorrect DOB on the COVID test on 08/15/20. She transferred me to Janett Billow, Customer Service Rep for [employer]. She says I am not able to correct the DOB over the phone, so she asked for the fax number to the ordering provider Dr. Joya Gaskins and she will fax a cover sheet with the new requisition that will need to be filled out with the correct DOB, date of service and service provided (COVID test on 08/15/20), then once they receive it back, they will bill the patient's insurance. Fax # for [facility] [phone]. Spoke to Ney, [facility] who verified receipt of form and will give it to Dr. Bettina Gavia nurse.

## 2021-04-04 NOTE — Telephone Encounter (Signed)
Will look for the form

## 2021-04-04 NOTE — Telephone Encounter (Signed)
Reason for Disposition  [1] Follow-up call to recent contact AND [2] information only call, no triage required  Answer Assessment - Initial Assessment Questions 1. REASON FOR CALL or QUESTION: "What is your reason for calling today?" or "How can I best help you?" or "What question do you have that I can help answer?"     Requesting assistance to contact [facility] to verify correct DOB that was previously requested on 02/27/21.  Protocols used: Information Only Call - No Triage-A-AH

## 2021-04-04 NOTE — Telephone Encounter (Signed)
Patient called and advised of the below. He verbalized understanding and says will it be filed with [place]. I advised [employer] is in his chart for primary and secondary insurance.

## 2021-04-05 ENCOUNTER — Ambulatory Visit: Payer: PPO | Admitting: Family Medicine

## 2021-04-05 ENCOUNTER — Other Ambulatory Visit: Payer: Self-pay | Admitting: Critical Care Medicine

## 2021-04-05 DIAGNOSIS — F4389 Other reactions to severe stress: Secondary | ICD-10-CM | POA: Diagnosis not present

## 2021-04-05 DIAGNOSIS — N5082 Scrotal pain: Secondary | ICD-10-CM | POA: Diagnosis not present

## 2021-04-05 DIAGNOSIS — F4323 Adjustment disorder with mixed anxiety and depressed mood: Secondary | ICD-10-CM | POA: Diagnosis not present

## 2021-04-05 DIAGNOSIS — Z1152 Encounter for screening for COVID-19: Secondary | ICD-10-CM

## 2021-04-05 DIAGNOSIS — D519 Vitamin B12 deficiency anemia, unspecified: Secondary | ICD-10-CM | POA: Diagnosis not present

## 2021-04-05 DIAGNOSIS — N50812 Left testicular pain: Secondary | ICD-10-CM | POA: Diagnosis not present

## 2021-04-05 DIAGNOSIS — R5382 Chronic fatigue, unspecified: Secondary | ICD-10-CM | POA: Diagnosis not present

## 2021-04-05 DIAGNOSIS — R102 Pelvic and perineal pain: Secondary | ICD-10-CM | POA: Diagnosis not present

## 2021-04-05 DIAGNOSIS — R002 Palpitations: Secondary | ICD-10-CM | POA: Diagnosis not present

## 2021-04-05 DIAGNOSIS — G43719 Chronic migraine without aura, intractable, without status migrainosus: Secondary | ICD-10-CM | POA: Diagnosis not present

## 2021-04-05 DIAGNOSIS — I471 Supraventricular tachycardia: Secondary | ICD-10-CM | POA: Diagnosis not present

## 2021-04-05 DIAGNOSIS — M6289 Other specified disorders of muscle: Secondary | ICD-10-CM | POA: Diagnosis not present

## 2021-04-05 DIAGNOSIS — K5904 Chronic idiopathic constipation: Secondary | ICD-10-CM | POA: Diagnosis not present

## 2021-04-05 NOTE — Telephone Encounter (Signed)
This was completed

## 2021-04-05 NOTE — Progress Notes (Signed)
[name] needing new requisition for 07/2020 covid test already done

## 2021-04-06 LAB — SARS-COV-2, NAA 2 DAY TAT

## 2021-04-06 LAB — NOVEL CORONAVIRUS, NAA: SARS-CoV-2, NAA: NOT DETECTED

## 2021-04-30 HISTORY — PX: COLONOSCOPY W/ BIOPSIES: SHX1374

## 2021-06-29 NOTE — Progress Notes (Signed)
Chief Complaint  Patient presents with   Follow-up    SVT     History of Present Illness: 61 yo male with history of palpitations, PVCs, PACs, SVT, migraine headaches, mitral valve prolapse and irritable bowel syndrome who is here today for cardiac follow up. He is known to have PVCs and SVT. He has not tolerated beta blockers. He had a normal echo and stress test in 2011.  Echo in 2014 with normal LV size and function. 48 hour monitor in 2014 with normal sinus rhythm and rare PACs. He was seen November 2018 in our office by Estella Husk, PA and had c/o palpitations. Cardiac event monitor November 2018 with sinus rhythm and one run of SVT (40 beats).  He was started on propranolol for his SVT by primary care but it caused diarrhea. I restarted his Cardizem CD when I saw him in February 2019. He was seen in our office October 2021 and reported more episodes of SVT. Cardiac monitor November 2021 with several short runs of SVT. He was seen in the EP clinic in November 2021 by Dr. Lennie Odor and he elected to continue Cardizem rather than move toward an ablation. He has since stopped taking Cardizem CD 180 mg daily due to some dizziness. He has been taking the short acting Diltiazem as needed. He is having palpitations several days per week. The patient denies any chest pain, dyspnea, lower extremity edema, orthopnea, PND, dizziness, near syncope or syncope.    Primary Care Physician: Shawnee Knapp, MD  Past Medical History:  Diagnosis Date   Allergy    Anxiety    Depression    Gastroparesis    Heart murmur    Migraine headache    Mitral valve prolapse    SVT (supraventricular tachycardia) ([place])    History reviewed. No pertinent surgical history.  Current Outpatient Medications  Medication Sig Dispense Refill   baclofen (LIORESAL) 10 MG tablet Take 10 mg by mouth 3 (three) times daily.     COVID-19 mRNA bivalent vaccine, Pfizer, injection Inject into the muscle. 0.3 mL 0   diltiazem  (CARDIZEM CD) 120 MG 24 hr capsule Take 1 capsule (120 mg total) by mouth daily. 90 capsule 3   diltiazem (CARDIZEM) 30 MG tablet TAKE (1) TABLET AS NEEDED FOR PALPITATIONS 30 tablet 5   EMGALITY 120 MG/ML SOAJ      fluticasone (FLONASE) 50 MCG/ACT nasal spray Place 2 sprays into both nostrils daily. 16 g 6   metoCLOPramide (REGLAN) 5 MG tablet Take 5 mg by mouth as needed.     ondansetron (ZOFRAN) 4 MG tablet Take 4 mg by mouth as needed.     Rimegepant Sulfate (NURTEC) 75 MG TBDP Take 75 mg by mouth as needed for up to 1 dose (Maximum 1 tablet in 24 hours.). 8 tablet 5   No current facility-administered medications for this visit.    Allergies  Allergen Reactions   Erythromycin Anaphylaxis and Nausea And Vomiting   Nalbuphine Other (See Comments)    States loss of consciousness    Cefdinir Diarrhea, Nausea And Vomiting and Rash   Cephalexin Diarrhea and Nausea And Vomiting   Cephalosporins Diarrhea and Nausea And Vomiting   Hydrocodone-Acetaminophen Nausea And Vomiting   Ketorolac Nausea And Vomiting   Prochlorperazine Edisylate Other (See Comments)    uncontrollable shake - tremor    Restasis [Cyclosporine] Other (See Comments)    migraines   Tyloxapol Swelling   Propranolol Diarrhea   Topamax [  Topiramate] Diarrhea and Nausea And Vomiting   Zetia [Ezetimibe] Diarrhea   Doxycycline Hyclate Nausea Only and Other (See Comments)    abd pain   Iodinated Contrast Media Other (See Comments)    Lightheaded, flushed feeling (explained to patient these are normal side effects of IV iodinated contrast, not allergies, but he wanted this noted in the epic; he tolerated epidural steroid injections w/ contrast w/o difficulty)   Ketoconazole Itching   Latex Itching   Neosporin [Neomycin-Bacitracin Zn-Polymyx] Rash   Oxycodone-Acetaminophen Other (See Comments)    Tylox caused constipation   Prednisone Nausea And Vomiting    Oral route, only    Social History   Socioeconomic History    Marital status: Married    Spouse name: Not on file   Number of children: 0   Years of education: Not on file   Highest education level: Bachelor's degree (e.g., BA, AB, BS)  Occupational History   Not on file  Tobacco Use   Smoking status: Never   Smokeless tobacco: Never  Vaping Use   Vaping Use: Never used  Substance and Sexual Activity   Alcohol use: No    Alcohol/week: 0.0 standard drinks   Drug use: No   Sexual activity: Yes    Partners: Female  Other Topics Concern   Not on file  Social History Narrative   Pt is married lives with spouse in 1 story apartment he has No children   BS degree- he is right handed   Drinks soda  Ginger ale, no coffee no tea   Right handed   Social Determinants of Radio broadcast assistant Strain: Not on file  Food Insecurity: Not on file  Transportation Needs: Not on file  Physical Activity: Not on file  Stress: Not on file  Social Connections: Not on file  Intimate Partner Violence: Not on file    Family History  Problem Relation Age of Onset   Hypertension Mother    Hypertension Brother    Kidney failure Maternal Grandmother    Prostate cancer Neg Hx    Kidney cancer Neg Hx     Review of Systems:  As stated in the HPI and otherwise negative.   BP 120/74    Pulse 79    Ht 5\' 10"  (1.778 m)    Wt 145 lb 6.4 oz (66 kg)    SpO2 97%    BMI 20.86 kg/m   Physical Examination:  General: Well developed, well nourished, NAD  HEENT: OP clear, mucus membranes moist  SKIN: warm, dry. No rashes. Neuro: No focal deficits  Musculoskeletal: Muscle strength 5/5 all ext  Psychiatric: Mood and affect normal  Neck: No JVD, no carotid bruits, no thyromegaly, no lymphadenopathy.  Lungs:Clear bilaterally, no wheezes, rhonci, crackles Cardiovascular: Regular rate and rhythm. No murmurs, gallops or rubs. Abdomen:Soft. Bowel sounds present. Non-tender.  Extremities: No lower extremity edema. Pulses are 2 + in the bilateral DP/PT.  Echo  07/10/12: Left ventricle: The cavity size was normal. Wall thickness was normal. Systolic function was normal. The estimated ejection fraction was in the range of 60% to 65%. Wall motion was normal; there were no regional wall motion abnormalities. Left ventricular diastolic function parameters were normal.   EKG:  EKG is  ordered today. The ekg ordered today demonstrates sinus  Recent Labs: 10/18/2020: ALT 14; BUN 11; Creatinine, Ser 1.16; Hemoglobin 15.4; Platelets 234.0; Potassium 4.1; Sodium 138; TSH 0.79   Lipid Panel    Component Value Date/Time  CHOL 233 (H) 12/29/2016 1036   TRIG 157 (H) 12/29/2016 1036   HDL 52 12/29/2016 1036   CHOLHDL 4.5 12/29/2016 1036   CHOLHDL 3.7 05/14/2015 0958   VLDL 25 05/14/2015 0958   LDLCALC 150 (H) 12/29/2016 1036     Wt Readings from Last 3 Encounters:  06/30/21 145 lb 6.4 oz (66 kg)  09/01/20 142 lb (64.4 kg)  06/15/20 144 lb (65.3 kg)     Other studies Reviewed: Additional studies/ records that were reviewed today include: . Review of the above records demonstrates:   Assessment and Plan:   1. SVT: He is known to have SVT. He has been seen by EP in 2021 and the plan was to continue Cardizem CD. He has since stopped taking Cardizem CD 180 mg daily due to dizziness. He has been using short acting Diltiazem as needed. He is having almost daily palpitations. He is willing to try Cardizem CD 120 mg once daily. He will use short acting Diltiazem for breakthrough palpitations. I will arrange an echo to assess LVEF. If he does not tolerate Cardizem CD or has breakthrough palpitations, will need to arrange a cardiac monitor and then f/u with Dr. Lennie Odor. .   Current medicines are reviewed at length with the patient today.  The patient does not have concerns regarding medicines.  The following changes have been made:    Labs/ tests ordered today include:   Orders Placed This Encounter  Procedures   EKG 12-Lead   ECHOCARDIOGRAM COMPLETE    Disposition:   F/U with me or office APP in 3 months.    Signed, Lauree Chandler, MD 06/30/2021 3:12 PM    [facility] [address], [place], [place]  [address] Phone: [phone]; Fax: [phone]

## 2021-06-30 ENCOUNTER — Other Ambulatory Visit: Payer: Self-pay

## 2021-06-30 ENCOUNTER — Ambulatory Visit: Payer: PPO | Admitting: Cardiovascular Disease

## 2021-06-30 ENCOUNTER — Encounter: Payer: Self-pay | Admitting: Cardiovascular Disease

## 2021-06-30 VITALS — BP 120/74 | HR 79 | Ht 70.0 in | Wt 145.4 lb

## 2021-06-30 DIAGNOSIS — I471 Supraventricular tachycardia: Secondary | ICD-10-CM

## 2021-06-30 MED ORDER — DILTIAZEM HCL ER COATED BEADS 120 MG PO CP24: 120.0000 mg | ORAL_CAPSULE | Freq: Every day | ORAL | 3 refills | Status: DC

## 2021-06-30 NOTE — Patient Instructions (Signed)
Medication Instructions:  ?Your physician has recommended you make the following change in your medication:  ?1.) start Cardizem CD 120 mg - take one tablet daily ? ?*If you need a refill on your cardiac medications before your next appointment, please call your pharmacy* ? ? ?Lab Work: ?none ?If you have labs (blood work) drawn today and your tests are completely normal, you will receive your results only by: ?MyChart Message (if you have MyChart) OR ?A paper copy in the mail ?If you have any lab test that is abnormal or we need to change your treatment, we will call you to review the results. ? ? ?Testing/Procedures: ?Your physician has requested that you have an echocardiogram. Echocardiography is a painless test that uses sound waves to create images of your heart. It provides your doctor with information about the size and shape of your heart and how well your heart?s chambers and valves are working. This procedure takes approximately one hour. There are no restrictions for this procedure. ? ? ?Follow-Up: ?At [facility], you and your health needs are our priority.  As part of our continuing mission to provide you with exceptional heart care, we have created designated Provider Care Teams.  These Care Teams include your primary Cardiologist (physician) and Advanced Practice Providers (APPs -  Physician Assistants and Nurse Practitioners) who all work together to provide you with the care you need, when you need it. ? ?Your next appointment:   ?3 month(s) ? ?The format for your next appointment:   ?In Person ? ?Provider:   ?Ermalinda Barrios, PA-C      ?Melina Copa, PA-C ?Or other available APP ? ?  ?

## 2021-07-10 ENCOUNTER — Telehealth: Payer: Self-pay | Admitting: Family Medicine

## 2021-07-10 NOTE — Telephone Encounter (Signed)
Patient called stating that a new doctor that he saw is suggesting that he see a Dealer. He asked if there is a urologist in the area that Dr Tamala Julian would recommend?  ? ?Please advise. ? ?

## 2021-07-10 NOTE — Telephone Encounter (Signed)
Sent patient MyChart message with recommendation.  

## 2021-07-11 NOTE — Progress Notes (Signed)
? ?NEUROLOGY FOLLOW UP OFFICE NOTE ? ?[name] ?[id number] ? ?Assessment/Plan:  ? ?1.  Migraine without aura, without status migrainosus, not intractable - increased frequency. ?2.  Benign essential tremor - very mild ?  ?1.  Migraine prevention:  Emgality ?2.  Migraine rescue:  Nurtec (refilled) and Zofran ?3.  Limit use of pain relievers to no more than 2 days out of week to prevent risk of rebound or medication-overuse headache. ?4.  Keep headache diary ?5.  Monitor tremor ?6.  Follow up one year ?  ?Subjective:  ?William [name] is a 61 year old right-handed male with hyperlipidemia, MVP, IBS, gastroparesis, generalized anxiety disorder and palpitations who follows up for migraines and tremor. ?  ?UPDATE: ?He feels like the Emgality no longer carries him until the next dose.  He starts having increased migraines beginning the third week.   ?Intensity:  Moderate to severe ?Duration:  Less than 15 minutes with Nurtec ?Frequency: No migraines the first two weeks after injection. He has near daily headaches the last 2 weeks.   ?Frequency of abortive medication: 5 days in past 30 days  ?  ?Current NSAIDS:  none ?Current analgesics:  None ?Current triptans:  Contraindicated (MVP) ?Current ergotamine:  none ?Current anti-emetic:  Zofran '4mg'$  ?Current muscle relaxants:  baclofen '10mg'$  TID ?Current anti-anxiolytic:  none ?Current sleep aide:  none ?Current Antihypertensive medications:  Cardizem ?Current Antidepressant medications:  none ?Current Anticonvulsant medications:  none ?Current anti-CGRP: Emgality; Nurtec ?Current Vitamins/Herbal/Supplements:  D ?Current Antihistamines/Decongestants:  Claritin, Flonase ?Other therapy:  none ?  ?Caffeine:  No coffee, tea or caffeinated soda ?Diet:  Needs to improve.  Sometimes ginger ale ?Exercise:  No ?Depression:  no; Anxiety:  Yes.  stress related to caring for his mother ?Other pain:  no ?Sleep hygiene:  Poor.  Stress related to caregiver for his mother ?   ?HISTORY: ?Migraines:  ?Onset: 1986.   ?Location: 90% of time: Left retro-orbital region, 10% bi-frontal ?Quality:  stabbing ?Initial intensity: Fluctuates from 4/10-10/10 ?Aura:  no ?Prodrome:  no ?Associated symptoms: Lightheadedness, fatigue, nausea, tearing of left eye. ?Initial duration: 4 hours ?Initial frequency: 3 to 4 times a week ?Triggers/aggravating factors: Afrin, odors (perfumes, cigarette, chinese food), MSG, salt, stress, extreme temperatures ?Relieving factors: Water ?Activity:  About 4 to 5 days a week, cannot function ? ?Tremor: ?He also reports longstanding history of tremor.  It involves the left hand.  It occurs with use, not at rest.  Over the past 10 years, it has gotten a little worse.  He does report increased emotional stress and poor sleep.  No problems with walking.  No family history of tremor.  ?  ?Past NSAIDS: Indomethacin ?Past analgesics: Nucynta, lidocaine patches, tramadol, nalbuphine, Tylox, bupivacaine injections, hydrocodone, morphine, Tylenol #2, Tylenol ?Past abortive triptans:  Sumatriptan po/[place], Maxalt, Relpax, Amerge, Zomig ?Past abortive ergotamine:  Migranal, DHE protocol ?Past muscle relaxants:  Robaxin, Flexeril ?Past anti-emetic:  Reglan, Phenergan ?Past anxiolytics:  Temazeam, clonazepam ?Past antihypertensive medications:  Inderal, metoprolol, verapamil ?Past antidepressant medications:  Nortriptyline, amitriptyline, venlafaxine, Wellbutrin, Remeron ?Past anticonvulsant medications:  Depakote, topiramate (weight loss, diarrhea), Lyrica,Keppra, gabapentin ?Past anti-CGRP:  none ?Past vitamins/Herbal/Supplements:  petasites ?Past antihistamines/decongestants:  none ?Other past therapies:  Biofeedback, acupuncture, counseling ?  ?  ?He has had multiple imaging of the head, all unremarkable, including:    ?CT HEAD from 06/01/09 and 07/30/09. ?MRI BRAIN W/WO from 12/08/02, 01/19/05, 04/18/12.  MRI of brain ordered by me from 11/19/2015 was personally reviewed and was  normal. ?   ?CERVICAL XR from 01/15/08 showed mild degenerative disc disease at C5-6. ?  ?He has seen multiple neurologists and headache and pain specialists, including Dr. Morene Antu, Dr. Asencion Partridge Dohmeieir, Dr. Earley Favor, Dr. Melton Alar, Dr. Rock Nephew at [facility] and he was seen at the [facility].   ?  ?He also has history of some obsessive compulsive symptoms since he was young.  For example, he repeatedly washes his hands and always uses a new cup during the day when he drinks something.  He has history of low blood pressure and has been evaluated by cardiology for palpitations.  Work up was unremarkable. ? ?PAST MEDICAL HISTORY: ?Past Medical History:  ?Diagnosis Date  ? Allergy   ? Anxiety   ? Depression   ? Gastroparesis   ? Heart murmur   ? Migraine headache   ? Mitral valve prolapse   ? SVT (supraventricular tachycardia) ([place])   ? ? ?MEDICATIONS: ?Current Outpatient Medications on File Prior to Visit  ?Medication Sig Dispense Refill  ? baclofen (LIORESAL) 10 MG tablet Take 10 mg by mouth 3 (three) times daily.    ? COVID-19 mRNA bivalent vaccine, Pfizer, injection Inject into the muscle. 0.3 mL 0  ? diltiazem (CARDIZEM CD) 120 MG 24 hr capsule Take 1 capsule (120 mg total) by mouth daily. 90 capsule 3  ? diltiazem (CARDIZEM) 30 MG tablet TAKE (1) TABLET AS NEEDED FOR PALPITATIONS 30 tablet 5  ? EMGALITY 120 MG/ML SOAJ     ? fluticasone (FLONASE) 50 MCG/ACT nasal spray Place 2 sprays into both nostrils daily. 16 g 6  ? metoCLOPramide (REGLAN) 5 MG tablet Take 5 mg by mouth as needed.    ? ondansetron (ZOFRAN) 4 MG tablet Take 4 mg by mouth as needed.    ? Rimegepant Sulfate (NURTEC) 75 MG TBDP Take 75 mg by mouth as needed for up to 1 dose (Maximum 1 tablet in 24 hours.). 8 tablet 5  ? ?No current facility-administered medications on file prior to visit.  ? ? ?ALLERGIES: ?Allergies  ?Allergen Reactions  ? Erythromycin Anaphylaxis and Nausea And Vomiting  ? Nalbuphine Other (See Comments)  ?  States loss of consciousness ?   ? Cefdinir Diarrhea, Nausea And Vomiting and Rash  ? Cephalexin Diarrhea and Nausea And Vomiting  ? Cephalosporins Diarrhea and Nausea And Vomiting  ? Hydrocodone-Acetaminophen Nausea And Vomiting  ? Ketorolac Nausea And Vomiting  ? Prochlorperazine Edisylate Other (See Comments)  ?  uncontrollable shake - tremor ?  ? Restasis [Cyclosporine] Other (See Comments)  ?  migraines  ? Tyloxapol Swelling  ? Propranolol Diarrhea  ? Topamax [Topiramate] Diarrhea and Nausea And Vomiting  ? Zetia [Ezetimibe] Diarrhea  ? Doxycycline Hyclate Nausea Only and Other (See Comments)  ?  abd pain  ? Iodinated Contrast Media Other (See Comments)  ?  Lightheaded, flushed feeling (explained to patient these are normal side effects of IV iodinated contrast, not allergies, but he wanted this noted in the epic; he tolerated epidural steroid injections w/ contrast w/o difficulty)  ? Ketoconazole Itching  ? Latex Itching  ? Neosporin [Neomycin-Bacitracin Zn-Polymyx] Rash  ? Oxycodone-Acetaminophen Other (See Comments)  ?  Tylox caused constipation  ? Prednisone Nausea And Vomiting  ?  Oral route, only  ? ? ?FAMILY HISTORY: ?Family History  ?Problem Relation Age of Onset  ? Hypertension Mother   ? Hypertension Brother   ? Kidney failure Maternal Grandmother   ? Prostate cancer Neg Hx   ?  Kidney cancer Neg Hx   ? ? ?  ?Objective:  ?Blood pressure 119/70, pulse 80, height '5\' 10"'$  (1.778 m), weight 146 lb 3.2 oz (66.3 kg), SpO2 98 %. ?General: No acute distress.  Patient appears well-groomed.   ?Head:  Normocephalic/atraumatic ?Eyes:  Fundi examined but not visualized ?Neck: supple, no paraspinal tenderness, full range of motion ?Heart:  Regular rate and rhythm ?Lungs:  Clear to auscultation bilaterally ?Back: No paraspinal tenderness ?Neurological Exam: alert and oriented to person, place, and time.  Speech fluent and not dysarthric, language intact.  CN II-XII intact. Bulk and tone normal, muscle strength 5/5 throughout.  Sensation to light  touch intact.  Deep tendon reflexes 2+ throughout, toes downgoing.  Finger to nose testing intact.  Gait normal, Romberg negative. ? ? ?Metta Clines, DO ? ?CC: [name], MD ? ? ? ? ? ? ?

## 2021-07-12 ENCOUNTER — Ambulatory Visit: Payer: PPO | Admitting: Neurology

## 2021-07-12 ENCOUNTER — Encounter: Payer: Self-pay | Admitting: Neurology

## 2021-07-12 ENCOUNTER — Other Ambulatory Visit: Payer: Self-pay

## 2021-07-12 VITALS — BP 119/70 | HR 80 | Ht 70.0 in | Wt 146.2 lb

## 2021-07-12 DIAGNOSIS — G43709 Chronic migraine without aura, not intractable, without status migrainosus: Secondary | ICD-10-CM

## 2021-07-12 NOTE — Patient Instructions (Signed)
Continue Emgality for now.  Consider starting Botox.  Let me know ?Nurtec for rescue ?Follow up 6 months or sooner for Botox if needed. ?

## 2021-07-17 ENCOUNTER — Other Ambulatory Visit (HOSPITAL_COMMUNITY): Payer: PPO

## 2021-07-19 ENCOUNTER — Other Ambulatory Visit: Payer: Self-pay

## 2021-07-19 ENCOUNTER — Ambulatory Visit (HOSPITAL_COMMUNITY): Payer: PPO | Attending: Internal Medicine

## 2021-07-19 DIAGNOSIS — I471 Supraventricular tachycardia: Secondary | ICD-10-CM | POA: Diagnosis present

## 2021-07-19 LAB — ECHOCARDIOGRAM COMPLETE
Area-P 1/2: 4.35 cm2
S' Lateral: 1.7 cm

## 2021-07-27 ENCOUNTER — Other Ambulatory Visit (HOSPITAL_COMMUNITY): Payer: PPO

## 2021-08-03 ENCOUNTER — Telehealth: Payer: Self-pay | Admitting: Cardiovascular Disease

## 2021-08-03 DIAGNOSIS — I471 Supraventricular tachycardia: Secondary | ICD-10-CM

## 2021-08-03 NOTE — Telephone Encounter (Signed)
The patient had questions about the PVCs/SVT.   He is taking Cardizem 120 mg and then daily he takes a 30 mg tablet, about twice a week he takes a second 30 mg tablet.  He was on Cardizem 180 mg but stopped due to dizziness.  He has had no dizziness.    ? ?He notes his heart racing when he beds over to clean the floor or tub and it lasts quite a while, those are times he takes the second 30 mg tablet.   ? ?I reviewed the last ov note 06/30/21. ? ?1. SVT: He is known to have SVT. He has been seen by EP in 2021 and the plan was to continue Cardizem CD. He has since stopped taking Cardizem CD 180 mg daily due to dizziness. He has been using short acting Diltiazem as needed. He is having almost daily palpitations. He is willing to try Cardizem CD 120 mg once daily. He will use short acting Diltiazem for breakthrough palpitations. I will arrange an echo to assess LVEF. If he does not tolerate Cardizem CD or has breakthrough palpitations, will need to arrange a cardiac monitor and then f/u with Dr. Lennie Odor. .  ? ?The patient is aware we will call him w recommendations.  Will route to Dr. Angelena Form to see how long he'd like pt to wear monitor. ?

## 2021-08-03 NOTE — Telephone Encounter (Signed)
Patient calling to discuss his recent Echo results  ?

## 2021-08-08 ENCOUNTER — Ambulatory Visit (INDEPENDENT_AMBULATORY_CARE_PROVIDER_SITE_OTHER): Payer: PPO

## 2021-08-08 DIAGNOSIS — I471 Supraventricular tachycardia: Secondary | ICD-10-CM

## 2021-08-08 NOTE — Progress Notes (Signed)
? ?08/10/21 ?9:34 AM  ? ?William [name] ?19-Feb-1961 ?[id number] ? ?Referring provider:  ?William Pepper, MD ?[address] ?[address] ?[place],  [place] [address] ?Chief Complaint  ?Patient presents with  ? Elevated PSA  ? ? ? ? ?HPI: ?William [name] is a 61 y.o.male with a personal history of chronic left testicular pain, left flank pain, chronic pelvic pain, and scrotal pain , who presents today for further evaluation of elevated PSA.  ? ?He was last seen in clinic in 2020 by me. At the time he had been seen and evaluated by many providers over the past several years for this including urologist at [facility], [facility] (Dr[facility] Diona Fanti) all of whom have found no pathology other than concern for possible pelvic floor dysfunction. PSA at the time was 1.6 in 2020.  ? ?He was seeing provider for low free testosterone.  He had recent labs on 07/08/2021; testosterone was 560,  normal prolactin, normal FSH, TSH,  his PSA was elevated  4.62. Testosterone was repeated he had normal free testosterone and available testosterone.  ? ?He was seeing physical therapy in the past and he had improvement with therapy. He hurt himself catching his mother from falling and he stopped going to PT. He reports urinary frequency that has been ongoing and stable. He denies any weight loss/ He denies any modifying or aggravating factors, gross hematuria, dysuria or suprapubic/flank pain.  He denies any fevers, chills, nausea or vomiting.  ? ?He reports pelvic pain that starts at left anterior rib cage and radiated to  inguinal are a to left groin to medial thigh down to his leg not typical pain  ? ?He reports that his father passed of cancer and his grandmother passed of kidney failure.  ? ? IPSS   ? ? [place] Name 08/09/21 0900  ?  ?  ?  ? International Prostate Symptom Score  ? How often have you had the sensation of not emptying your bladder? About half the time    ? How often have you had to urinate less than every two  hours? About half the time    ? How often have you found you stopped and started again several times when you urinated? Less than 1 in 5 times    ? How often have you found it difficult to postpone urination? Less than 1 in 5 times    ? How often have you had a weak urinary stream? Less than 1 in 5 times    ? How often have you had to strain to start urination? Not at All    ? How many times did you typically get up at night to urinate? 1 Time    ? Total IPSS Score 10    ?  ? Quality of Life due to urinary symptoms  ? If you were to spend the rest of your life with your urinary condition just the way it is now how would you feel about that? Mostly Satisfied    ? ?  ?  ? ?  ? ? ?Score:  ?1-7 Mild ?8-19 Moderate ?20-35 Severe ? ? ? ?PMH: ?Past Medical History:  ?Diagnosis Date  ? Allergy   ? Anxiety   ? Depression   ? Gastroparesis   ? Heart murmur   ? Migraine headache   ? Mitral valve prolapse   ? SVT (supraventricular tachycardia) ([place])   ? ? ?Surgical History: ?No past surgical history on file. ? ?Home Medications:  ?  Allergies as of 08/09/2021   ? ?   Reactions  ? Erythromycin Anaphylaxis, Nausea And Vomiting  ? Nalbuphine Other (See Comments)  ? States loss of consciousness  ? Cefdinir Diarrhea, Nausea And Vomiting, Rash  ? Cephalexin Diarrhea, Nausea And Vomiting  ? Cephalosporins Diarrhea, Nausea And Vomiting  ? Hydrocodone-acetaminophen Nausea And Vomiting  ? Ketorolac Nausea And Vomiting  ? Prochlorperazine Edisylate Other (See Comments)  ? uncontrollable shake - tremor  ? Restasis [cyclosporine] Other (See Comments)  ? migraines  ? Tyloxapol Swelling  ? Propranolol Diarrhea  ? Topamax [topiramate] Diarrhea, Nausea And Vomiting  ? Zetia [ezetimibe] Diarrhea  ? Doxycycline Hyclate Nausea Only, Other (See Comments)  ? abd pain  ? Iodinated Contrast Media Other (See Comments)  ? Lightheaded, flushed feeling (explained to patient these are normal side effects of IV iodinated contrast, not allergies, but he wanted  this noted in the epic; he tolerated epidural steroid injections w/ contrast w/o difficulty)  ? Ketoconazole Itching  ? Latex Itching, Rash  ? Neosporin [neomycin-bacitracin Zn-polymyx] Rash  ? Oxycodone-acetaminophen Other (See Comments)  ? Tylox caused constipation  ? Prednisone Nausea And Vomiting  ? Oral route, only  ? ?  ? ?  ?Medication List  ?  ? ?  ? Accurate as of August 09, 2021  9:34 AM. If you have any questions, ask your nurse or doctor.  ?  ?  ? ?  ? ?baclofen 10 MG tablet ?Commonly known as: LIORESAL ?Take 10 mg by mouth 3 (three) times daily. ?  ?diltiazem 120 MG 24 hr capsule ?Commonly known as: CARDIZEM CD ?Take 1 capsule (120 mg total) by mouth daily. ?  ?diltiazem 30 MG tablet ?Commonly known as: CARDIZEM ?TAKE (1) TABLET AS NEEDED FOR PALPITATIONS ?  ?Emgality 120 MG/ML Soaj ?Generic drug: Galcanezumab-gnlm ?  ?fluticasone 50 MCG/ACT nasal spray ?Commonly known as: FLONASE ?Place 2 sprays into both nostrils daily. ?  ?metoCLOPramide 5 MG tablet ?Commonly known as: REGLAN ?Take 5 mg by mouth as needed. ?  ?Nurtec 75 MG Tbdp ?Generic drug: Rimegepant Sulfate ?Take 75 mg by mouth as needed for up to 1 dose (Maximum 1 tablet in 24 hours.). ?  ?ondansetron 4 MG tablet ?Commonly known as: ZOFRAN ?Take 4 mg by mouth as needed. ?  ?Pfizer COVID-19 Vac Bivalent injection ?Generic drug: COVID-19 mRNA bivalent vaccine [employer]) ?Inject into the muscle. ?  ? ?  ? ? ?Allergies:  ?Allergies  ?Allergen Reactions  ? Erythromycin Anaphylaxis and Nausea And Vomiting  ? Nalbuphine Other (See Comments)  ?  States loss of consciousness ?  ? Cefdinir Diarrhea, Nausea And Vomiting and Rash  ? Cephalexin Diarrhea and Nausea And Vomiting  ? Cephalosporins Diarrhea and Nausea And Vomiting  ? Hydrocodone-Acetaminophen Nausea And Vomiting  ? Ketorolac Nausea And Vomiting  ? Prochlorperazine Edisylate Other (See Comments)  ?  uncontrollable shake - tremor ?  ? Restasis [Cyclosporine] Other (See Comments)  ?  migraines  ?  Tyloxapol Swelling  ? Propranolol Diarrhea  ? Topamax [Topiramate] Diarrhea and Nausea And Vomiting  ? Zetia [Ezetimibe] Diarrhea  ? Doxycycline Hyclate Nausea Only and Other (See Comments)  ?  abd pain  ? Iodinated Contrast Media Other (See Comments)  ?  Lightheaded, flushed feeling (explained to patient these are normal side effects of IV iodinated contrast, not allergies, but he wanted this noted in the epic; he tolerated epidural steroid injections w/ contrast w/o difficulty)  ? Ketoconazole Itching  ? Latex Itching and  Rash  ? Neosporin [Neomycin-Bacitracin Zn-Polymyx] Rash  ? Oxycodone-Acetaminophen Other (See Comments)  ?  Tylox caused constipation  ? Prednisone Nausea And Vomiting  ?  Oral route, only  ? ? ?Family History: ?Family History  ?Problem Relation Age of Onset  ? Hypertension Mother   ? Hypertension Brother   ? Kidney failure Maternal Grandmother   ? Prostate cancer Neg Hx   ? Kidney cancer Neg Hx   ? ? ?Social History:  reports that he has never smoked. He has never used smokeless tobacco. He reports that he does not drink alcohol and does not use drugs. ? ? ?Physical Exam: ?BP 131/76   Pulse 74   Ht '5\' 10"'$  (1.778 m)   Wt 141 lb (64 kg)   BMI 20.23 kg/m?   ?Constitutional:  Alert and oriented, No acute distress. ?HEENT: [place] AT, moist mucus membranes.  Trachea midline, no masses. ?Cardiovascular: No clubbing, cyanosis, or edema. ?Respiratory: Normal respiratory effort, no increased work of breathing. ?GU: Thorough inguinal exam with no sign of hernia testicles were descended bilaterally erythematic meatus.    ?Rectal: Increased sphincter tone,  40+  CC prostate, smooth no nodules ?Skin: No rashes, bruises or suspicious lesions. ?Neurologic: Grossly intact, no focal deficits, moving all 4 extremities. ?Psychiatric: Normal mood and affect. ? ?Laboratory Data: ? ?Lab Results  ?Component Value Date  ? CREATININE 1.16 10/18/2020  ? ?Lab Results  ?Component Value Date  ? HGBA1C 5.4 12/29/2016   ? ? ?Urinalysis ?Unremarkable  ? ?Pertinent Imaging: ?Results for orders placed or performed in visit on 08/09/21  ?BLADDER SCAN AMB NON-IMAGING  ?Result Value Ref Range  ? Scan Result 0 ml   ? ? ?Assessment & Plan:

## 2021-08-08 NOTE — Telephone Encounter (Signed)
Reply from Dr. Angelena Form: ?I think a three day monitor will be ok. Thanks, chris ? ?Called patient and informed of recommendation to wear 3 day ZIO monitor.  He is aware it will be mailed to him and we will call him with results. ? ?

## 2021-08-08 NOTE — Progress Notes (Unsigned)
Enrolled patient for a 3 day Zio XT monitor to be mailed to patients home  

## 2021-08-09 ENCOUNTER — Ambulatory Visit: Payer: PPO | Admitting: Urology

## 2021-08-09 VITALS — BP 131/76 | HR 74 | Ht 70.0 in | Wt 141.0 lb

## 2021-08-09 DIAGNOSIS — R102 Pelvic and perineal pain: Secondary | ICD-10-CM | POA: Diagnosis not present

## 2021-08-09 DIAGNOSIS — R972 Elevated prostate specific antigen [PSA]: Secondary | ICD-10-CM | POA: Diagnosis not present

## 2021-08-09 LAB — MICROSCOPIC EXAMINATION
Bacteria, UA: NONE SEEN
Epithelial Cells (non renal): NONE SEEN /hpf (ref 0–10)

## 2021-08-09 LAB — URINALYSIS, COMPLETE
Bilirubin, UA: NEGATIVE
Glucose, UA: NEGATIVE
Ketones, UA: NEGATIVE
Leukocytes,UA: NEGATIVE
Nitrite, UA: NEGATIVE
Protein,UA: NEGATIVE
RBC, UA: NEGATIVE
Specific Gravity, UA: 1.02 (ref 1.005–1.030)
Urobilinogen, Ur: 0.2 mg/dL (ref 0.2–1.0)
pH, UA: 6 (ref 5.0–7.5)

## 2021-08-09 LAB — BLADDER SCAN AMB NON-IMAGING: Scan Result: 0

## 2021-08-09 NOTE — Patient Instructions (Signed)

## 2021-08-11 LAB — PSA: Prostate Specific Ag, Serum: 5.3 ng/mL — ABNORMAL HIGH (ref 0.0–4.0)

## 2021-08-12 DIAGNOSIS — I471 Supraventricular tachycardia: Secondary | ICD-10-CM | POA: Diagnosis not present

## 2021-08-14 ENCOUNTER — Encounter: Payer: Self-pay | Admitting: Urology

## 2021-08-14 NOTE — Telephone Encounter (Signed)
Reviewed prostate biopsy instructions, voiced understanding.  ?

## 2021-08-23 ENCOUNTER — Encounter: Payer: Self-pay | Admitting: Cardiovascular Disease

## 2021-09-01 ENCOUNTER — Ambulatory Visit: Payer: PPO | Admitting: Neurology

## 2021-09-04 NOTE — Progress Notes (Signed)
? ?  09/05/2021 ? ?CC:  ?Chief Complaint  ?Patient presents with  ? Prostate Biopsy  ? ? ?HPI: ?William [name] is a 61 y.o. male with a personal history of elevated PSA, chronic left testicular pain, left flank pain, chronic pelvic pain, and scrotal pain who presents today for a prostate biopsy.  ? ?He was last seen in clinic in 2020 by me. At the time he had been seen and evaluated by many providers over the past several years for this including [facility]rologist at [facility], [facility] (Dr[facility] Diona Fanti) all of whom have found no pathology other than concern for possible pelvic floor dysfunction. PSA at the time was 1.6 in 2020.  ?  ?He was seeing provider for low free testosterone.  He had recent labs on 07/08/2021; testosterone was 560,  normal prolactin, normal FSH, TSH,  his PSA was elevated  4.62. Testosterone was repeated he had normal free testosterone and available testosterone.  ?  ?His most recent PSA was 5.3 on 08/09/2021. His rectal exam was benign.  ? ?Vitals:  ? 09/05/21 0846  ?BP: (!) 149/77  ?Pulse: 77  ? ?NED. A&Ox3.   ?No respiratory distress   ?Abd soft, NT, ND ?Normal external genitalia with patent urethral meatus ? ?Prostate Biopsy Procedure  ? ?Informed consent was obtained after discussing risks/benefits of the procedure.  A time out was performed to ensure correct patient identity. ? ?Pre-Procedure: ?- Last PSA Level:  ?Component ?    Latest Ref Rng 08/09/2021  ?Prostate Specific Ag, Serum ?    0.0 - 4.0 ng/mL 5.3 (H)   ?  ?(H) High ? ?- Gentamicin given prophylactically ?- Levaquin 500 mg administered PO ?-Transrectal Ultrasound performed revealing a 52.8  gm prostate ?-No significant hypoechoic or median lobe noted ? ? ?Procedure: ?- Prostate block performed using 10 cc 1% lidocaine and biopsies taken from sextant areas, a total of 12 under ultrasound guidance. ? ?Post-Procedure: ?- Patient tolerated the procedure well ?- He was counseled to seek immediate medical attention if experiences  any severe pain, significant bleeding, or fevers ?- Return in one week to discuss biopsy results ? ?I,Kailey Littlejohn,acting as a scribe for Hollice Espy, MD.,have documented all relevant documentation on the behalf of Hollice Espy, MD,as directed by  Hollice Espy, MD while in the presence of Hollice Espy, MD. ? ?I have reviewed the above documentation for accuracy and completeness, and I agree with the above.  ? ?Hollice Espy, MD ? ?

## 2021-09-05 ENCOUNTER — Encounter: Payer: Self-pay | Admitting: Urology

## 2021-09-05 ENCOUNTER — Ambulatory Visit: Payer: PPO | Admitting: Urology

## 2021-09-05 ENCOUNTER — Telehealth: Payer: Self-pay | Admitting: Cardiovascular Disease

## 2021-09-05 VITALS — BP 149/77 | HR 77 | Ht 70.0 in | Wt 141.0 lb

## 2021-09-05 DIAGNOSIS — C61 Malignant neoplasm of prostate: Secondary | ICD-10-CM

## 2021-09-05 DIAGNOSIS — R972 Elevated prostate specific antigen [PSA]: Secondary | ICD-10-CM | POA: Diagnosis not present

## 2021-09-05 MED ORDER — GENTAMICIN SULFATE 40 MG/ML IJ SOLN
80.0000 mg | Freq: Once | INTRAMUSCULAR | Status: AC
  Administered 2021-09-05: 80 mg via INTRAMUSCULAR

## 2021-09-05 MED ORDER — LEVOFLOXACIN 500 MG PO TABS
500.0000 mg | ORAL_TABLET | Freq: Once | ORAL | Status: AC
  Administered 2021-09-05: 500 mg via ORAL

## 2021-09-05 NOTE — Telephone Encounter (Signed)
Patient c/o Palpitations:  High priority if patient c/o lightheadedness, shortness of breath, or chest pain ? ?How long have you had palpitations/irregular HR/ Afib? Has been having palpitations.  Are you having the symptoms now? yes ? ?Are you currently experiencing lightheadedness, SOB or CP? No  ? ?Do you have a history of afib (atrial fibrillation) or irregular heart rhythm? He has history of palpitations.  ? ?Have you checked your BP or HR? (document readings if available): 102/69 HR 93 ? ?Are you experiencing any other symptoms? Patient took a diltiazem 5:30am '120mg'$  extended release, and then took another 11:30 '30mg'$  tablet. At 1245pm today he took zofran for nausea. He wants to know if he can take another Diltiazem 30 mg, because he is still having the palpitations.     ?  He went today for a biopsy of his prostate and is a little stressed out because of it.  Thinks that may be a part of it.   ?

## 2021-09-05 NOTE — Telephone Encounter (Signed)
The patient reports he had a prostate biopsy today.  On the way home from this his heart started to flutter/skip.  When he got home he took a short acting diltiazem.  Currently his BP is 107/71, 92 , he does not feel racing but feels skipping.  He is going to take a second diltiazem 30 mg now and rest and hydrate.  He is aware he can call an on call provider after hours if needed.   His EP appointment is June 1. ? ?He reports he had nausea earlier also and took zofran.  It helped the nausea but asks if he could have another.  I adv him to let the doctor that did his procedure today know if nausea continues before taking any more zofran.   ?

## 2021-09-07 LAB — SURGICAL PATHOLOGY

## 2021-09-12 NOTE — Progress Notes (Signed)
[place] [facility] [place] Phone: [phone] Subjective:   [name], am serving as a scribe for Dr. Hulan Saas.  This visit occurred during the SARS-CoV-2 public health emergency.  Safety protocols were in place, including screening questions prior to the visit, additional usage of staff PPE, and extensive cleaning of exam room while observing appropriate contact time as indicated for disinfecting solutions.    I'm seeing this patient by the request  of:  [name], MD  CC: Right leg pain and back pain  GBT:[id number]  Sharif Rendell is a 61 y.o. male coming in with complaint of R leg and calf pain. Last seen June 2022 for back and shoulder pain. Patient states that he is having R calf pain and stiffness after sitting prolonged periods. Does state that symptoms are improving but he kept appointment. Has had lower left sided back pain that he's done PT for within past few years.  States it does seem to get better when he starts doing activity.  Does not remember any specific swelling or any specific injury.     Past Medical History:  Diagnosis Date   Allergy    Anxiety    Depression    Gastroparesis    Heart murmur    Migraine headache    Mitral valve prolapse    SVT (supraventricular tachycardia) (HCC)    No past surgical history on file. Social History   Socioeconomic History   Marital status: Married    Spouse name: Not on file   Number of children: 0   Years of education: Not on file   Highest education level: Bachelor's degree (e.g., BA, AB, BS)  Occupational History   Not on file  Tobacco Use   Smoking status: Never   Smokeless tobacco: Never  Vaping Use   Vaping Use: Never used  Substance and Sexual Activity   Alcohol use: No    Alcohol/week: 0.0 standard drinks   Drug use: No   Sexual activity: Yes    Partners: Female  Other Topics Concern   Not on file  Social History Narrative    Pt is married lives with spouse in 1 story apartment he has No children   BS degree- he is right handed   Drinks soda  Ginger ale, no coffee no tea   Right handed   Social Determinants of Radio broadcast assistant Strain: Not on file  Food Insecurity: Not on file  Transportation Needs: Not on file  Physical Activity: Not on file  Stress: Not on file  Social Connections: Not on file   Allergies  Allergen Reactions   Erythromycin Anaphylaxis and Nausea And Vomiting   Nalbuphine Other (See Comments)    States loss of consciousness    Cefdinir Diarrhea, Nausea And Vomiting and Rash   Cephalexin Diarrhea and Nausea And Vomiting   Cephalosporins Diarrhea and Nausea And Vomiting   Hydrocodone-Acetaminophen Nausea And Vomiting   Ketorolac Nausea And Vomiting   Prochlorperazine Edisylate Other (See Comments)    uncontrollable shake - tremor    Restasis [Cyclosporine] Other (See Comments)    migraines   Tyloxapol Swelling   Bacitracin-Polymyxin B     Other reaction(s): rash gets worse   Codeine Other (See Comments)   Nalbuphine Hcl     Other reaction(s): stomach upset   Propranolol Diarrhea   Topamax [Topiramate] Diarrhea and Nausea And Vomiting   Zetia [Ezetimibe] Diarrhea   Doxycycline Hyclate  Nausea Only and Other (See Comments)    abd pain   Iodinated Contrast Media Other (See Comments)    Lightheaded, flushed feeling (explained to patient these are normal side effects of IV iodinated contrast, not allergies, but he wanted this noted in the epic; he tolerated epidural steroid injections w/ contrast w/o difficulty)   Ketoconazole Itching   Latex Itching and Rash   Neosporin [Neomycin-Bacitracin Zn-Polymyx] Rash   Oxycodone-Acetaminophen Other (See Comments)    Tylox caused constipation   Prednisone Nausea And Vomiting    Oral route, only   Family History  Problem Relation Age of Onset   Hypertension Mother    Hypertension Brother    Kidney failure Maternal  Grandmother    Prostate cancer Neg Hx    Kidney cancer Neg Hx      Current Outpatient Medications (Cardiovascular):    diltiazem (CARDIZEM CD) 120 MG 24 hr capsule, Take 1 capsule (120 mg total) by mouth daily.   diltiazem (CARDIZEM) 30 MG tablet, TAKE (1) TABLET AS NEEDED FOR PALPITATIONS  Current Outpatient Medications (Respiratory):    fluticasone (FLONASE) 50 MCG/ACT nasal spray, Place 2 sprays into both nostrils daily.   fluticasone (FLONASE) 50 MCG/ACT nasal spray, 1 spray in each nostril  Current Outpatient Medications (Analgesics):    EMGALITY 120 MG/ML SOAJ,    Rimegepant Sulfate (NURTEC) 75 MG TBDP, Take 75 mg by mouth as needed for up to 1 dose (Maximum 1 tablet in 24 hours.).   Current Outpatient Medications (Other):    baclofen (LIORESAL) 10 MG tablet, Take 10 mg by mouth 3 (three) times daily.   COVID-19 mRNA bivalent vaccine, Pfizer, injection, Inject into the muscle.   metoCLOPramide (REGLAN) 5 MG tablet, Take 5 mg by mouth as needed.   ondansetron (ZOFRAN) 4 MG tablet, Take 4 mg by mouth as needed.   ondansetron (ZOFRAN-ODT) 8 MG disintegrating tablet, Take 8 mg by mouth 3 (three) times daily as needed.   Reviewed prior external information including notes and imaging from  primary care provider As well as notes that were available from care everywhere and other healthcare systems.  Patient has seen a urologist and recent pathology has shown adenocarcinoma of the right side of the prostate gland.  Appears they are awaiting a bone scan  Past medical history, social, surgical and family history all reviewed in electronic medical record.  No pertanent information unless stated regarding to the chief complaint.   Review of Systems:  No headache, visual changes, nausea, vomiting, diarrhea, constipation, dizziness, abdominal pain, skin rash, fevers, chills, night sweats, weight loss, swollen lymph nodes, body aches, joint swelling, chest pain, shortness of breath, mood  changes. POSITIVE muscle aches  Objective  Blood pressure 112/72, pulse 88, height '5\' 10"'$  (1.778 m), weight 148 lb (67.1 kg), SpO2 98 %.   General: No apparent distress alert and oriented x3 mood and affect normal, dressed appropriately.  HEENT: Pupils equal, extraocular movements intact  Respiratory: Patient's speak in full sentences and does not appear short of breath  Cardiovascular: No lower extremity edema, non tender, no erythema  Gait normal with good balance and coordination.  MSK: Right leg does not show any significant swelling noted.  Patient is nontender on exam today.  Back exam does have tightness in the paraspinal musculature but no midline tenderness.  Patient does have some tightness with FABER test right greater than left.    Impression and Recommendations:     The above documentation has been reviewed and is  accurate and complete Lyndal Pulley, DO

## 2021-09-13 ENCOUNTER — Encounter: Payer: Self-pay | Admitting: Urology

## 2021-09-13 ENCOUNTER — Ambulatory Visit: Payer: PPO | Admitting: Urology

## 2021-09-13 VITALS — BP 133/81 | HR 84 | Ht 70.0 in | Wt 141.0 lb

## 2021-09-13 DIAGNOSIS — C61 Malignant neoplasm of prostate: Secondary | ICD-10-CM

## 2021-09-13 DIAGNOSIS — R972 Elevated prostate specific antigen [PSA]: Secondary | ICD-10-CM | POA: Diagnosis not present

## 2021-09-13 NOTE — Patient Instructions (Signed)
100 questions and answers about prostate cancer. You are able to get it on [place]. ?

## 2021-09-13 NOTE — Progress Notes (Signed)
09/13/21 8:22 AM   William [name] November 13, 1960 [id number]  Referring provider:  [name], MD [place] [address],  [place] [address] Chief Complaint  Patient presents with   Results    HPI: William [name] is a 61 y.o.male  with a personal history of elevated PSA, chronic left testicular pain, left flank pain, chronic pelvic pain, and scrotal pain who presents today for a prostate biopsy results.   He has been seen and evaluated by many providers over the past several years for this including urologist at [facility], [facility] (Dr. Diona Fanti) all of whom have found no pathology other than concern for possible pelvic floor dysfunction.   He was seeing provider for low free testosterone.  He had recent labs on 07/08/2021; testosterone was 560,  normal prolactin, normal FSH, TSH,  his PSA was elevated  4.62. Testosterone was repeated he had normal free testosterone and available testosterone.    His most recent PSA was 5.3 on 08/09/2021. His rectal exam was benign.    He is s/p prostate biopsy on 09/05/2021.  Surgical pathology showed Gleason 3+4 involving 1/12 cores affecting  35%, Gleason 3+3 involving 1/12 core affecting  3%, and Gleason 4+3 involving 1/12 core affecting 11%.   Significant baseline pelvic pain that is chronic.   Surgical pathology:  [A] PROSTATE, LEFT BASE:   NEGATIVE FOR MALIGNANCY.   [B] PROSTATE, LEFT MID:   NEGATIVE FOR MALIGNANCY.   [C] PROSTATE, LEFT APEX:   NEGATIVE FOR MALIGNANCY.   [D] PROSTATE, RIGHT BASE:   NEGATIVE FOR MALIGNANCY.   [E] PROSTATE, RIGHT MID:   ACINAR ADENOCARCINOMA, GLEASON 3+3=6  (GG  1), INVOLVING 1 OF 1 CORES, MEASURING 0.5  MM ( 3%).   [F] PROSTATE, RIGHT APEX:   NEGATIVE FOR MALIGNANCY.   [G] PROSTATE, LEFT LATERAL BASE:   NEGATIVE FOR MALIGNANCY.   [H] PROSTATE, LEFT LATERAL MID:   NEGATIVE FOR MALIGNANCY.   [I] PROSTATE, LEFT LATERAL APEX:   NEGATIVE FOR MALIGNANCY.   [J] PROSTATE,  RIGHT LATERAL BASE:   ACINAR ADENOCARCINOMA, GLEASON  3+4=7  (GG 2), INVOLVING 1 OF 1 CORES, MEASURING 4.6  MM ( 35%). 15%  PATTERN 4   [K] PROSTATE, RIGHT LATERAL MID:   ACINAR ADENOCARCINOMA, GLEASON 4+3=7  (GG 3), INVOLVING 1 OF 1 CORES, MEASURING 1.8  MM ( 11%). 60% PATTERN 4   [L] PROSTATE, RIGHT LATERAL APEX:   NEGATIVE FOR MALIGNANCY.      IPSS     Row Name 09/13/21 1100         International Prostate Symptom Score   How often have you had the sensation of not emptying your bladder? About half the time     How often have you had to urinate less than every two hours? Less than half the time     How often have you found you stopped and started again several times when you urinated? Less than half the time     How often have you found it difficult to postpone urination? Less than half the time     How often have you had a weak urinary stream? Less than 1 in 5 times     How often have you had to strain to start urination? Less than half the time     How many times did you typically get up at night to urinate? 1 Time     Total IPSS Score 13  Quality of Life due to urinary symptoms   If you were to spend the rest of your life with your urinary condition just the way it is now how would you feel about that? Mostly Satisfied              Score:  1-7 Mild 8-19 Moderate 20-35 Severe   SHIM     Row Name 09/13/21 1128         SHIM: Over the last 6 months:   How do you rate your confidence that you could get and keep an erection? Very Low     When you had erections with sexual stimulation, how often were your erections hard enough for penetration (entering your partner)? Almost Never or Never     During sexual intercourse, how often were you able to maintain your erection after you had penetrated (entered) your partner? Almost Never or Never     During sexual intercourse, how difficult was it to maintain your erection to completion of intercourse? Extremely Difficult      When you attempted sexual intercourse, how often was it satisfactory for you? Almost Never or Never       SHIM Total Score   SHIM 5               PMH:  Past Medical History:  Diagnosis Date   Allergy    Anxiety    Depression    Gastroparesis    Heart murmur    Migraine headache    Mitral valve prolapse    SVT (supraventricular tachycardia) (HCC)     Surgical History: No past surgical history on file.  Home Medications:  Allergies as of 09/13/2021       Reactions   Erythromycin Anaphylaxis, Nausea And Vomiting   Nalbuphine Other (See Comments)   States loss of consciousness   Cefdinir Diarrhea, Nausea And Vomiting, Rash   Cephalexin Diarrhea, Nausea And Vomiting   Cephalosporins Diarrhea, Nausea And Vomiting   Hydrocodone-acetaminophen Nausea And Vomiting   Ketorolac Nausea And Vomiting   Prochlorperazine Edisylate Other (See Comments)   uncontrollable shake - tremor   Restasis [cyclosporine] Other (See Comments)   migraines   Tyloxapol Swelling   Bacitracin-polymyxin B    Other reaction(s): rash gets worse   Codeine Other (See Comments)   Nalbuphine Hcl    Other reaction(s): stomach upset   Propranolol Diarrhea   Topamax [topiramate] Diarrhea, Nausea And Vomiting   Zetia [ezetimibe] Diarrhea   Doxycycline Hyclate Nausea Only, Other (See Comments)   abd pain   Iodinated Contrast Media Other (See Comments)   Lightheaded, flushed feeling (explained to patient these are normal side effects of IV iodinated contrast, not allergies, but he wanted this noted in the epic; he tolerated epidural steroid injections w/ contrast w/o difficulty)   Ketoconazole Itching   Latex Itching, Rash   Neosporin [neomycin-bacitracin Zn-polymyx] Rash   Oxycodone-acetaminophen Other (See Comments)   Tylox caused constipation   Prednisone Nausea And Vomiting   Oral route, only        Medication List        Accurate as of Sep 13, 2021 11:59 PM. If you have any questions,  ask your nurse or doctor.          baclofen 10 MG tablet Commonly known as: LIORESAL Take 10 mg by mouth 3 (three) times daily.   diltiazem 120 MG 24 hr capsule Commonly known as: CARDIZEM CD Take 1 capsule (120 mg total) by mouth daily.  diltiazem 30 MG tablet Commonly known as: CARDIZEM TAKE (1) TABLET AS NEEDED FOR PALPITATIONS   Emgality 120 MG/ML Soaj Generic drug: Galcanezumab-gnlm   fluticasone 50 MCG/ACT nasal spray Commonly known as: FLONASE Place 2 sprays into both nostrils daily.   fluticasone 50 MCG/ACT nasal spray Commonly known as: FLONASE 1 spray in each nostril   metoCLOPramide 5 MG tablet Commonly known as: REGLAN Take 5 mg by mouth as needed.   Nurtec 75 MG Tbdp Generic drug: Rimegepant Sulfate Take 75 mg by mouth as needed for up to 1 dose (Maximum 1 tablet in 24 hours.).   ondansetron 4 MG tablet Commonly known as: ZOFRAN Take 4 mg by mouth as needed.   ondansetron 8 MG disintegrating tablet Commonly known as: ZOFRAN-ODT Take 8 mg by mouth 3 (three) times daily as needed.   Pfizer COVID-19 Vac Bivalent injection Generic drug: COVID-19 mRNA bivalent vaccine Therapist, music) Inject into the muscle.        Allergies:  Allergies  Allergen Reactions   Erythromycin Anaphylaxis and Nausea And Vomiting   Nalbuphine Other (See Comments)    States loss of consciousness    Cefdinir Diarrhea, Nausea And Vomiting and Rash   Cephalexin Diarrhea and Nausea And Vomiting   Cephalosporins Diarrhea and Nausea And Vomiting   Hydrocodone-Acetaminophen Nausea And Vomiting   Ketorolac Nausea And Vomiting   Prochlorperazine Edisylate Other (See Comments)    uncontrollable shake - tremor    Restasis [Cyclosporine] Other (See Comments)    migraines   Tyloxapol Swelling   Bacitracin-Polymyxin B     Other reaction(s): rash gets worse   Codeine Other (See Comments)   Nalbuphine Hcl     Other reaction(s): stomach upset   Propranolol Diarrhea   Topamax  [Topiramate] Diarrhea and Nausea And Vomiting   Zetia [Ezetimibe] Diarrhea   Doxycycline Hyclate Nausea Only and Other (See Comments)    abd pain   Iodinated Contrast Media Other (See Comments)    Lightheaded, flushed feeling (explained to patient these are normal side effects of IV iodinated contrast, not allergies, but he wanted this noted in the epic; he tolerated epidural steroid injections w/ contrast w/o difficulty)   Ketoconazole Itching   Latex Itching and Rash   Neosporin [Neomycin-Bacitracin Zn-Polymyx] Rash   Oxycodone-Acetaminophen Other (See Comments)    Tylox caused constipation   Prednisone Nausea And Vomiting    Oral route, only    Family History: Family History  Problem Relation Age of Onset   Hypertension Mother    Hypertension Brother    Kidney failure Maternal Grandmother    Prostate cancer Neg Hx    Kidney cancer Neg Hx     Social History:  reports that he has never smoked. He has never used smokeless tobacco. He reports that he does not drink alcohol and does not use drugs.   Physical Exam: BP 133/81   Pulse 84   Ht '5\' 10"'$  (1.778 m)   Wt 141 lb (64 kg)   BMI 20.23 kg/m   Constitutional:  Alert and oriented, No acute distress. HEENT: [place] AT, moist mucus membranes.  Trachea midline, no masses. Cardiovascular: No clubbing, cyanosis, or edema. Respiratory: Normal respiratory effort, no increased work of breathing. Skin: No rashes, bruises or suspicious lesions. Neurologic: Grossly intact, no focal deficits, moving all 4 extremities. Psychiatric: Normal mood and affect.  Laboratory Data: Lab Results  Component Value Date   CREATININE 1.16 10/18/2020   Lab Results  Component Value Date   HGBA1C 5.4  12/29/2016    Assessment & Plan:   Prostate cancer -- Newly diagnosed unfavorable intermediate risk prostate cancer  - The patient was counseled about the natural history of prostate cancer and the standard treatment options that are available for  prostate cancer. It was explained to him how his age and life expectancy, clinical stage, Gleason score, and PSA affect his prognosis, the decision to proceed with additional staging studies, as well as how that information influences recommended treatment strategies. We discussed the roles for active surveillance, radiation therapy, surgical therapy, androgen deprivation, as well as ablative therapy options for the treatment of prostate cancer as appropriate to his individual cancer situation. We discussed the risks and benefits of these options with regard to their impact on cancer control and also in terms of potential adverse events, complications, and impact on quality of life particularly related to urinary, bowel, and sexual function. The patient was encouraged to ask questions throughout the discussion today and all questions were answered to his stated satisfaction. In addition, the patient was provided with and/or directed to appropriate resources and literature for further education about prostate cancer treatment options.  We discussed surgical therapy for prostate cancer including the different available surgical approaches.  Specifically, we discussed robotic prostatectomy with pelvic lymph node dissection based on his restratification.  We discussed, in detail, the risks and expectations of surgery with regard to cancer control, urinary control, and erectile dysfunction as well as expected post operative recovery processed. Additional risks of surgery including but not limited to bleeding, infection, hernia formation, nerve damage, fistula formation, bowel/rectal injury, potentially necessitating colostomy, damage to the urinary tract resulting in urinary leakage, urethral stricture, and cardiopulmonary risk such as myocardial infarction, stroke, death, thromboembolism etc. were explained.  - Will need imaging in the form of PSMA PET scan or CT abdomen pelvis, bone scan.  If it is negative he  would be an excellent candidate for prostatectomy based on age and other comorbidity   - Referral to radiation oncology for second opinion  -I did reiterate that each of surgical versus radiation treatment option is highly effective and the overall prognosis is excellent.  Advantages to surgery include possibility of salvage radiation down the road should he have a late recurrence.  - Referral sent to physical therapy; if he elects to proceed with radical prostatectomy, he would benefit from pelvic floor exercises.  If not, his history of chronic pelvic pain would benefit from evaluation from that standpoint.  Regardless, at this time, physical therapy referral is warranted.  -He did express desire for additional literature, given the name of 100 questions book.  His wife also declined to come today and he is certain that she will have lots of questions.  He was referred to use this office note as reference, advised to bring her to radiation oncology appointment and to follow-up visits if she desires.  F/u after rad onc consult, staging imaging to further discuss surgical option  Conley Rolls as a scribe for Hollice Espy, MD.,have documented all relevant documentation on the behalf of Hollice Espy, MD,as directed by  Hollice Espy, MD while in the presence of Hollice Espy, MD.  I have reviewed the above documentation for accuracy and completeness, and I agree with the above.   Hollice Espy, MD  [facility] [address] [place], [place] [address] [phone]  I spent 50 total minutes on the day of the encounter including pre-visit review of the medical record, face-to-face  time with the patient, and post visit ordering of labs/imaging/tests.

## 2021-09-15 ENCOUNTER — Encounter: Payer: Self-pay | Admitting: Urology

## 2021-09-18 ENCOUNTER — Ambulatory Visit: Payer: PPO | Admitting: Family Medicine

## 2021-09-18 ENCOUNTER — Encounter: Payer: Self-pay | Admitting: Family Medicine

## 2021-09-18 ENCOUNTER — Ambulatory Visit (INDEPENDENT_AMBULATORY_CARE_PROVIDER_SITE_OTHER): Payer: PPO

## 2021-09-18 ENCOUNTER — Ambulatory Visit: Payer: Self-pay

## 2021-09-18 VITALS — BP 112/72 | HR 88 | Ht 70.0 in | Wt 148.0 lb

## 2021-09-18 DIAGNOSIS — M25561 Pain in right knee: Secondary | ICD-10-CM

## 2021-09-18 DIAGNOSIS — M25551 Pain in right hip: Secondary | ICD-10-CM

## 2021-09-18 DIAGNOSIS — M545 Low back pain, unspecified: Secondary | ICD-10-CM | POA: Diagnosis not present

## 2021-09-18 DIAGNOSIS — M79604 Pain in right leg: Secondary | ICD-10-CM

## 2021-09-18 DIAGNOSIS — M25552 Pain in left hip: Secondary | ICD-10-CM

## 2021-09-18 DIAGNOSIS — M25562 Pain in left knee: Secondary | ICD-10-CM | POA: Diagnosis not present

## 2021-09-18 DIAGNOSIS — M79661 Pain in right lower leg: Secondary | ICD-10-CM | POA: Insufficient documentation

## 2021-09-18 NOTE — Patient Instructions (Signed)
Lumbar pelvis B knee xrays today Work on getting bone scan and write [place] Lace shoes diff and break them in See me in 2-3 months and we are here if you have questions

## 2021-09-18 NOTE — Assessment & Plan Note (Signed)
Patient does have what appears to be more prostate cancer.  With the Pain we discussed the potential of the Doppler but patient is actually feeling better.  Feels like it could be potentially coming from his back.  We discussed with patient different treatment options and patient is elected to go with more x-rays to make sure there is nothing else that could be contributing.  We discussed home exercises and continue to stay active.  Encourage patient to continue to follow-up with his urologist and is awaiting a bone scan at the moment.  Patient can call us if he has any other significant questions or worsening pain.  Patient knows if any swelling in the calf or any worsening pain to seek medical attention in going for Doppler.  Patient voiced understanding.  Follow-up with me again in 6 to 8 weeks otherwise.  Total time reviewing patient's outside notes as well as discussing with patient 36 minutes

## 2021-09-19 ENCOUNTER — Ambulatory Visit (INDEPENDENT_AMBULATORY_CARE_PROVIDER_SITE_OTHER): Payer: PPO

## 2021-09-19 ENCOUNTER — Other Ambulatory Visit: Payer: Self-pay | Admitting: Family Medicine

## 2021-09-19 DIAGNOSIS — M79661 Pain in right lower leg: Secondary | ICD-10-CM

## 2021-09-20 ENCOUNTER — Encounter: Payer: Self-pay | Admitting: Family Medicine

## 2021-09-21 ENCOUNTER — Encounter: Payer: Self-pay | Admitting: Radiation Oncology

## 2021-09-21 ENCOUNTER — Ambulatory Visit
Admission: RE | Admit: 2021-09-21 | Discharge: 2021-09-21 | Disposition: A | Discharge status: Home / Self Care | Payer: PPO | Source: Ambulatory Visit | Attending: Radiation Oncology | Admitting: Radiation Oncology

## 2021-09-21 ENCOUNTER — Telehealth: Payer: Self-pay

## 2021-09-21 ENCOUNTER — Other Ambulatory Visit: Payer: Self-pay

## 2021-09-21 VITALS — BP 120/84 | HR 76 | Temp 98.4°F | Resp 12 | Ht 70.0 in | Wt 145.2 lb

## 2021-09-21 DIAGNOSIS — Z79899 Other long term (current) drug therapy: Secondary | ICD-10-CM | POA: Insufficient documentation

## 2021-09-21 DIAGNOSIS — I471 Supraventricular tachycardia: Secondary | ICD-10-CM | POA: Diagnosis not present

## 2021-09-21 DIAGNOSIS — C61 Malignant neoplasm of prostate: Secondary | ICD-10-CM | POA: Diagnosis present

## 2021-09-21 DIAGNOSIS — I341 Nonrheumatic mitral (valve) prolapse: Secondary | ICD-10-CM | POA: Diagnosis not present

## 2021-09-21 DIAGNOSIS — R011 Cardiac murmur, unspecified: Secondary | ICD-10-CM | POA: Insufficient documentation

## 2021-09-21 NOTE — Telephone Encounter (Signed)
Agree with the plan.

## 2021-09-21 NOTE — Consult Note (Signed)
NEW PATIENT EVALUATION  Name: William [name]  MRN: [id number]  Date:   09/21/2021     DOB: [birth year]   This 61 y.o. male patient presents to the clinic for initial evaluation of stage IIc (T1 cN0 M0) Gleason 7 (4+3) adenocarcinoma the prostate presenting with a PSA of 5.3.  REFERRING PHYSICIAN: [name], MD  CHIEF COMPLAINT:  Chief Complaint  Patient presents with   Prostate Cancer    DIAGNOSIS: The encounter diagnosis was Malignant neoplasm of prostate ([place]).   PREVIOUS INVESTIGATIONS:  PET scan has been ordered Pathology reports reviewed Clinical notes reviewed  HPI: Patient is a 61 year old male who presented with a chronic history of left testicular flank pain pelvic pain with no etiology seeing multiple physicians.  He was noted to have an elevated PSA and was seen by Dr. Erlene Quan.  His PSA was 4.6.  On April 12 it was 5.3.  His rectal exam was unremarkable.  He underwent biopsy showing 3 cores positive mixture of Gleason 7 (4+3) Gleason 7 (3+4) and Gleason 6 (3+3).  He is fairly asymptomatic specifically denies any urinary frequency urgency or nocturia.  He is having no bowel issues.  Except for his pelvic pain is having no bone pain.  He has a PET/CT scheduled for next week.  PLANNED TREATMENT REGIMEN: Probable robotic prostatectomy  PAST MEDICAL HISTORY:  has a past medical history of Allergy, Anxiety, Depression, Gastroparesis, Heart murmur, Migraine headache, Mitral valve prolapse, and SVT (supraventricular tachycardia) ([place]).    PAST SURGICAL HISTORY: History reviewed. No pertinent surgical history.  FAMILY HISTORY: family history includes Hypertension in his brother and mother; Kidney failure in his maternal grandmother.  SOCIAL HISTORY:  reports that he has never smoked. He has never used smokeless tobacco. He reports that he does not drink alcohol and does not use drugs.  ALLERGIES: Erythromycin, Nalbuphine, Cefdinir, Cephalexin, Cephalosporins,  Hydrocodone-acetaminophen, Ketorolac, Prochlorperazine edisylate, Restasis [cyclosporine], Tyloxapol, Bacitracin-polymyxin b, Codeine, Nalbuphine hcl, Propranolol, Topamax [topiramate], Zetia [ezetimibe], Doxycycline hyclate, Iodinated contrast media, Ketoconazole, Latex, Neosporin [neomycin-bacitracin zn-polymyx], Oxycodone-acetaminophen, and Prednisone  MEDICATIONS:  Current Outpatient Medications  Medication Sig Dispense Refill   baclofen (LIORESAL) 10 MG tablet Take 10 mg by mouth 3 (three) times daily.     diltiazem (CARDIZEM CD) 120 MG 24 hr capsule Take 1 capsule (120 mg total) by mouth daily. 90 capsule 3   diltiazem (CARDIZEM) 30 MG tablet TAKE (1) TABLET AS NEEDED FOR PALPITATIONS 30 tablet 5   EMGALITY 120 MG/ML SOAJ      fluticasone (FLONASE) 50 MCG/ACT nasal spray Place 2 sprays into both nostrils daily. 16 g 6   fluticasone (FLONASE) 50 MCG/ACT nasal spray 1 spray in each nostril     ondansetron (ZOFRAN) 4 MG tablet Take 4 mg by mouth as needed.     ondansetron (ZOFRAN-ODT) 8 MG disintegrating tablet Take 8 mg by mouth 3 (three) times daily as needed.     Rimegepant Sulfate (NURTEC) 75 MG TBDP Take 75 mg by mouth as needed for up to 1 dose (Maximum 1 tablet in 24 hours.). 8 tablet 5   metoCLOPramide (REGLAN) 5 MG tablet Take 5 mg by mouth as needed. (Patient not taking: Reported on 09/21/2021)     No current facility-administered medications for this encounter.    ECOG PERFORMANCE STATUS:  0 - Asymptomatic  REVIEW OF SYSTEMS: Patient denies any weight loss, fatigue, weakness, fever, chills or night sweats. Patient denies any loss of vision, blurred vision. Patient denies any ringing  of  the ears or hearing loss. No irregular heartbeat. Patient denies heart murmur or history of fainting. Patient denies any chest pain or pain radiating to her upper extremities. Patient denies any shortness of breath, difficulty breathing at night, cough or hemoptysis. Patient denies any swelling in  the lower legs. Patient denies any nausea vomiting, vomiting of blood, or coffee ground material in the vomitus. Patient denies any stomach pain. Patient states has had normal bowel movements no significant constipation or diarrhea. Patient denies any dysuria, hematuria or significant nocturia. Patient denies any problems walking, swelling in the joints or loss of balance. Patient denies any skin changes, loss of hair or loss of weight. Patient denies any excessive worrying or anxiety or significant depression. Patient denies any problems with insomnia. Patient denies excessive thirst, polyuria, polydipsia. Patient denies any swollen glands, patient denies easy bruising or easy bleeding. Patient denies any recent infections, allergies or URI. Patient "s visual fields have not changed significantly in recent time.   PHYSICAL EXAM: BP 120/84 (BP Location: Left Arm, Patient Position: Sitting, Cuff Size: Normal)   Pulse 76   Temp 98.4 F (36.9 C)   Resp 12   Ht '5\' 10"'$  (1.778 m)   Wt 145 lb 3.2 oz (65.9 kg)   BMI 20.83 kg/m  Well-developed well-nourished patient in NAD. HEENT reveals PERLA, EOMI, discs not visualized.  Oral cavity is clear. No oral mucosal lesions are identified. Neck is clear without evidence of cervical or supraclavicular adenopathy. Lungs are clear to A&P. Cardiac examination is essentially unremarkable with regular rate and rhythm without murmur rub or thrill. Abdomen is benign with no organomegaly or masses noted. Motor sensory and DTR levels are equal and symmetric in the upper and lower extremities. Cranial nerves II through XII are grossly intact. Proprioception is intact. No peripheral adenopathy or edema is identified. No motor or sensory levels are noted. Crude visual fields are within normal range.  LABORATORY DATA: Pathology reports reviewed    RADIOLOGY RESULTS: PET CT scan will be reviewed when available   IMPRESSION: Stage IIIc Gleason 7 adenocarcinoma the prostate in  61 year old male  PLAN: At this time of gone over treatment recommendations including robotic prostatectomy as well as I-125 interstitial implant.  Based on his young age radiation choice would be for an interstitial implant.  Risks and benefits of all treatments were reviewed with the patient and his wife.  My first preference would be for robotic prostatectomy based on ability to salvage with radiation should there be positive margins or biochemical failure after prostatectomy.  I have also discussed with him interstitial implant volume study as well as outpatient surgical procedure.  I answered all him and his wife's questions.  He has a follow-up with Dr. Erlene Quan will make a decision after his PET CT scan which I expect to show only localized disease.  I would like to take this opportunity to thank you for allowing me to participate in the care of your patient.Noreene Filbert, MD

## 2021-09-26 ENCOUNTER — Encounter (HOSPITAL_COMMUNITY)
Admission: RE | Admit: 2021-09-26 | Discharge: 2021-09-26 | Disposition: A | Discharge status: Home / Self Care | Payer: PPO | Source: Ambulatory Visit | Attending: Urology | Admitting: Urology

## 2021-09-26 DIAGNOSIS — R972 Elevated prostate specific antigen [PSA]: Secondary | ICD-10-CM | POA: Insufficient documentation

## 2021-09-26 MED ORDER — PIFLIFOLASTAT F 18 (PYLARIFY) INJECTION
9.0000 | Freq: Once | INTRAVENOUS | Status: AC
  Administered 2021-09-26: 9.76 via INTRAVENOUS

## 2021-09-27 ENCOUNTER — Other Ambulatory Visit: Payer: Self-pay

## 2021-09-27 MED ORDER — DILTIAZEM HCL 30 MG PO TABS: ORAL_TABLET | ORAL | 8 refills | Status: DC

## 2021-09-27 NOTE — Telephone Encounter (Signed)
Pt's medication was sent to pt's pharmacy as requested. Confirmation received.  °

## 2021-09-28 ENCOUNTER — Ambulatory Visit: Payer: PPO | Admitting: Cardiology

## 2021-09-28 ENCOUNTER — Encounter: Payer: Self-pay | Admitting: Cardiology

## 2021-09-28 ENCOUNTER — Ambulatory Visit: Payer: PPO

## 2021-09-28 VITALS — BP 108/68 | HR 81 | Ht 70.0 in | Wt 146.6 lb

## 2021-09-28 DIAGNOSIS — I471 Supraventricular tachycardia: Secondary | ICD-10-CM

## 2021-09-28 NOTE — Patient Instructions (Signed)
Medication Instructions:  Your physician recommends that you continue on your current medications as directed. Please refer to the Current Medication list given to you today.  *If you need a refill on your cardiac medications before your next appointment, please call your pharmacy*   Lab Work: None ordered   Testing/Procedures: None ordered   Follow-Up: At [facility], you and your health needs are our priority.  As part of our continuing mission to provide you with exceptional heart care, we have created designated Provider Care Teams.  These Care Teams include your primary Cardiologist (physician) and Advanced Practice Providers (APPs -  Physician Assistants and Nurse Practitioners) who all work together to provide you with the care you need, when you need it.  Your next appointment:   3 month(s)  The format for your next appointment:   In Person  Provider:   Allegra Lai, MD    Thank you for choosing [facility]!!   Trinidad Curet, RN [phone]  Other Instructions   Important Information About Sugar        Cardiac Ablation Cardiac ablation is a procedure to destroy (ablate) some heart tissue that is sending bad signals. These bad signals cause problems in heart rhythm. The heart has many areas that make these signals. If there are problems in these areas, they can make the heart beat in a way that is not normal. Destroying some tissues can help make the heart rhythm normal. Tell your doctor about: Any allergies you have. All medicines you are taking. These include vitamins, herbs, eye drops, creams, and over-the-counter medicines. Any problems you or family members have had with medicines that make you fall asleep (anesthetics). Any blood disorders you have. Any surgeries you have had. Any medical conditions you have, such as kidney failure. Whether you are pregnant or may be pregnant. What are the risks? This is a safe procedure. But problems may  occur, including: Infection. Bruising and bleeding. Bleeding into the chest. Stroke or blood clots. Damage to nearby areas of your body. Allergies to medicines or dyes. The need for a pacemaker if the normal system is damaged. Failure of the procedure to treat the problem. What happens before the procedure? Medicines Ask your doctor about: Changing or stopping your normal medicines. This is important. Taking aspirin and ibuprofen. Do not take these medicines unless your doctor tells you to take them. Taking other medicines, vitamins, herbs, and supplements. General instructions Follow instructions from your doctor about what you cannot eat or drink. Plan to have someone take you home from the hospital or clinic. If you will be going home right after the procedure, plan to have someone with you for 24 hours. Ask your doctor what steps will be taken to prevent infection. What happens during the procedure?  An IV tube will be put into one of your veins. You will be given a medicine to help you relax. The skin on your neck or groin will be numbed. A cut (incision) will be made in your neck or groin. A needle will be put through your cut and into a large vein. A tube (catheter) will be put into the needle. The tube will be moved to your heart. Dye may be put through the tube. This helps your doctor see your heart. Small devices (electrodes) on the tube will send out signals. A type of energy will be used to destroy some heart tissue. The tube will be taken out. Pressure will be held on your cut.  This helps stop bleeding. A bandage will be put over your cut. The exact procedure may vary among doctors and hospitals. What happens after the procedure? You will be watched until you leave the hospital or clinic. This includes checking your heart rate, breathing rate, oxygen, and blood pressure. Your cut will be watched for bleeding. You will need to lie still for a few hours. Do not drive  for 24 hours or as long as your doctor tells you. Summary Cardiac ablation is a procedure to destroy some heart tissue. This is done to treat heart rhythm problems. Tell your doctor about any medical conditions you may have. Tell him or her about all medicines you are taking to treat them. This is a safe procedure. But problems may occur. These include infection, bruising, bleeding, and damage to nearby areas of your body. Follow what your doctor tells you about food and drink. You may also be told to change or stop some of your medicines. After the procedure, do not drive for 24 hours or as long as your doctor tells you. This information is not intended to replace advice given to you by your health care provider. Make sure you discuss any questions you have with your health care provider. Document Revised: 03/19/2019 Document Reviewed: 03/19/2019 Elsevier Patient Education  [place].

## 2021-09-28 NOTE — Progress Notes (Signed)
Electrophysiology Office Note   Date:  09/28/2021   ID:  William [name], DOB 07-05-1960, MRN [id number]  PCP:  [name], MD  Cardiologist:  Angelena Form Primary Electrophysiologist:  Treana Lacour Meredith Leeds, MD    Chief Complaint: SVT   History of Present Illness: William [name] is a 61 y.o. male who is being seen today for the evaluation of SVT at the request of [name], MD. Presenting today for electrophysiology evaluation.  The history is significant for SVT, migraines, mitral valve prolapse.  He had not tolerated beta-blockers in the past for his PVCs.  He was remotely treated with diltiazem.  He had an echo and stress test in 2011 they were both normal.  He was admitted to the hospital in 2014 with chest pain.  He had sinus tachycardia on telemetry.  In 2018 but he presented with persistent palpitations.  He had a 40 beat run of SVT on cardiac monitor.  He recently wore a monitor that showed a 23-minute run of SVT.  Today, denies symptoms of palpitations, chest pain, shortness of breath, orthopnea, PND, lower extremity edema, claudication, dizziness, presyncope, syncope, bleeding, or neurologic sequela. The patient is tolerating medications without difficulties.  Since being seen he is continued to have episodes of SVT.  He wore a cardiac monitor that showed up to a 20 minutes episode.  He has palpitations and shortness of breath.  He currently takes as needed diltiazem as well as daily diltiazem for his episodes.  Unfortunately he was recently diagnosed with prostate cancer and is currently undergoing work-up for therapy plans.   Past Medical History:  Diagnosis Date   Allergy    Anxiety    Depression    Gastroparesis    Heart murmur    Migraine headache    Mitral valve prolapse    SVT (supraventricular tachycardia) ([place])    History reviewed. No pertinent surgical history.   Current Outpatient Medications  Medication Sig Dispense Refill   baclofen  (LIORESAL) 10 MG tablet Take 10 mg by mouth 3 (three) times daily.     diltiazem (CARDIZEM CD) 120 MG 24 hr capsule Take 1 capsule (120 mg total) by mouth daily. 90 capsule 3   diltiazem (CARDIZEM) 30 MG tablet TAKE (1) TABLET AS NEEDED FOR PALPITATIONS 30 tablet 8   EMGALITY 120 MG/ML SOAJ as directed.     fluticasone (FLONASE) 50 MCG/ACT nasal spray Place 2 sprays into both nostrils daily. 16 g 6   metoCLOPramide (REGLAN) 5 MG tablet Take 5 mg by mouth as needed.     ondansetron (ZOFRAN-ODT) 8 MG disintegrating tablet Take 8 mg by mouth 3 (three) times daily as needed.     Rimegepant Sulfate (NURTEC) 75 MG TBDP Take 75 mg by mouth as needed for up to 1 dose (Maximum 1 tablet in 24 hours.). 8 tablet 5   No current facility-administered medications for this visit.    Allergies:   Erythromycin, Nalbuphine, Cefdinir, Cephalexin, Cephalosporins, Hydrocodone-acetaminophen, Ketorolac, Prochlorperazine edisylate, Restasis [cyclosporine], Tyloxapol, Bacitracin-polymyxin b, Codeine, Nalbuphine hcl, Prochlorperazine, Propranolol, Topamax [topiramate], Zetia [ezetimibe], Cefdinir, Doxycycline hyclate, Iodinated contrast media, Ketoconazole, Ketorolac tromethamine, Latex, Neosporin [neomycin-bacitracin zn-polymyx], Oxycodone-acetaminophen, and Prednisone   Social History:  The patient  reports that he has never smoked. He has never used smokeless tobacco. He reports that he does not drink alcohol and does not use drugs.   Family History:  The patient's family history includes Hypertension in his brother and mother; Kidney failure in his maternal grandmother.  ROS:  Please see the history of present illness.   Otherwise, review of systems is positive for none.   All other systems are reviewed and negative.   PHYSICAL EXAM: VS:  BP 108/68   Pulse 81   Ht '5\' 10"'$  (1.778 m)   Wt 146 lb 9.6 oz (66.5 kg)   SpO2 97%   BMI 21.03 kg/m  , BMI Body mass index is 21.03 kg/m. GEN: Well nourished, well  developed, in no acute distress  HEENT: normal  Neck: no JVD, carotid bruits, or masses Cardiac: RRR; no murmurs, rubs, or gallops,no edema  Respiratory:  clear to auscultation bilaterally, normal work of breathing GI: soft, nontender, nondistended, + BS MS: no deformity or atrophy  Skin: warm and dry Neuro:  Strength and sensation are intact Psych: euthymic mood, full affect  EKG:  EKG is not ordered today. Personal review of the ekg ordered 06/30/21 shows sinus rhythm, rate 79  Recent Labs: 10/18/2020: ALT 14; BUN 11; Creatinine, Ser 1.16; Hemoglobin 15.4; Platelets 234.0; Potassium 4.1; Sodium 138; TSH 0.79    Lipid Panel     Component Value Date/Time   CHOL 233 (H) 12/29/2016 1036   TRIG 157 (H) 12/29/2016 1036   HDL 52 12/29/2016 1036   CHOLHDL 4.5 12/29/2016 1036   CHOLHDL 3.7 05/14/2015 0958   VLDL 25 05/14/2015 0958   LDLCALC 150 (H) 12/29/2016 1036     Wt Readings from Last 3 Encounters:  09/28/21 146 lb 9.6 oz (66.5 kg)  09/21/21 145 lb 3.2 oz (65.9 kg)  09/18/21 148 lb (67.1 kg)      Other studies Reviewed: Additional studies/ records that were reviewed today include: Cardiac monitor 08/22/2021 personally reviewed Review of the above records today demonstrates:  Sinus rhythm. Minimum HR of 54 bpm with average HR of 79 bpm.  4 Supraventricular Tachycardia runs occurred, the run with the fastest interval lasting 23 mins 38 secs with a max rate of 187 bpm  (avg 158 bpm); the run with the fastest interval was also the longest. Supraventricular Tachycardia was detected within +/- 45 seconds of symptomatic patient event(s) Rare premature atrial contractions Rare premature ventricular contractions  TTE 07/19/21  1. Left ventricular ejection fraction, by estimation, is 70 to 75%. Left  ventricular ejection fraction by 3D volume is 70 %. The left ventricle has  hyperdynamic function. The left ventricle has no regional wall motion  abnormalities. Left ventricular   diastolic parameters are consistent with Grade I diastolic dysfunction  (impaired relaxation). The average left ventricular global longitudinal  strain is 23.5 %. The global longitudinal strain is normal.   2. Right ventricular systolic function is normal. The right ventricular  size is normal. There is normal pulmonary artery systolic pressure. The  estimated right ventricular systolic pressure is 99.3 mmHg.   3. No diagnostic prolapse. The mitral valve leaflets are mildly  thickened. Trivial mitral valve regurgitation.   4. The aortic valve is tricuspid. Aortic valve regurgitation is not  visualized.   5. The inferior vena cava is normal in size with greater than 50%  respiratory variability, suggesting right atrial pressure of 3 mmHg.   ASSESSMENT AND PLAN:  1.  SVT: Wore a cardiac monitor with 23 minutes of SVT at a heart rate of 187 bpm.  He is currently on diltiazem daily and as needed diltiazem.  At this point, with his recent prostate cancer diagnosis, he would like to come up with a plan of therapy for his prostate cancer  prior to any invasive procedure for SVT.  We Angeli Demilio continue with his current management for his SVT.  He Tyshon Fanning call us back if he wants to be scheduled.  Current medicines are reviewed at length with the patient today.   The patient does not have concerns regarding his medicines.  The following changes were made today: None  Labs/ tests ordered today include:  No orders of the defined types were placed in this encounter.    Disposition:   FU with Khushi Zupko 3 months  Signed, Nocole Zammit Meredith Leeds, MD  09/28/2021 9:48 AM     [facility] [address] [place] [address] [phone] (office) [phone] (fax)

## 2021-09-29 ENCOUNTER — Telehealth: Payer: Self-pay | Admitting: Cardiovascular Disease

## 2021-09-29 NOTE — Telephone Encounter (Signed)
Pt would like a call back regarding PET Scan results. Please advise

## 2021-09-29 NOTE — Progress Notes (Deleted)
Cardiology Office Note    Date:  09/29/2021   ID:  Horatio Bertz, DOB 11/14/1960, MRN [id number]   PCP:  [name], [place]  Cardiologist:  Lauree Chandler, MD *** Advanced Practice Provider:  No care team member to display Electrophysiologist:  None   [phone]   No chief complaint on file.   History of Present Illness:  William [name] is a 61 y.o. male   with history of palpitations, PVCs, PACs, SVT, migraine headaches, mitral valve prolapse and irritable bowel syndrome.  He had a normal echo and stress test in 2011.  Echo in 2014 with normal LV size and function. 48 hour monitor in 2014 with normal sinus rhythm and rare PACs.  Cardiac event monitor November 2018 with sinus rhythm and one run of SVT (40 beats).  He was started on propranolol for his SVT by primary care but it caused diarrhea. I restarted his Cardizem CD when I saw him in February 2019. He was seen in our office October 2021 and reported more episodes of SVT. Cardiac monitor November 2021 with several short runs of SVT. He was seen in the EP clinic in November 2021 by Dr. Lennie Odor and he elected to continue Cardizem rather than move toward an ablation.  Patient saw Dr. Angelena Form 06/30/21 when He has since stopped taking Cardizem CD 180 mg daily due to some dizziness. He has been taking the short acting Diltiazem as needed. He was having more palpitations. Repeat monitor showed 23 min SVT 187/mand echo showed normal LVEF, grade 1 DD.  Patient saw Dr. Curt Bears but wanted to hold off on ablation because of recent prostate cancer diagnosis. PET scan showed incidental finding of aortic and LAD coronary artery calcification.    Past Medical History:  Diagnosis Date   Allergy    Anxiety    Depression    Gastroparesis    Heart murmur    Migraine headache    Mitral valve prolapse    SVT (supraventricular tachycardia) (HCC)     No past surgical history on  file.  Current Medications: No outpatient medications have been marked as taking for the 10/11/21 encounter (Appointment) with Imogene Burn, PA-C.     Allergies:   Erythromycin, Nalbuphine, Cefdinir, Cephalexin, Cephalosporins, Hydrocodone-acetaminophen, Ketorolac, Prochlorperazine edisylate, Restasis [cyclosporine], Tyloxapol, Bacitracin-polymyxin b, Codeine, Nalbuphine hcl, Prochlorperazine, Propranolol, Topamax [topiramate], Zetia [ezetimibe], Cefdinir, Doxycycline hyclate, Iodinated contrast media, Ketoconazole, Ketorolac tromethamine, Latex, Neosporin [neomycin-bacitracin zn-polymyx], Oxycodone-acetaminophen, and Prednisone   Social History   Socioeconomic History   Marital status: Married    Spouse name: Not on file   Number of children: 0   Years of education: Not on file   Highest education level: Bachelor's degree (e.g., BA, AB, BS)  Occupational History   Not on file  Tobacco Use   Smoking status: Never   Smokeless tobacco: Never  Vaping Use   Vaping Use: Never used  Substance and Sexual Activity   Alcohol use: No    Alcohol/week: 0.0 standard drinks   Drug use: No   Sexual activity: Yes    Partners: Female  Other Topics Concern   Not on file  Social History Narrative   Pt is married lives with spouse in 1 story apartment he has No children   BS degree- he is right handed   Drinks soda  Ginger ale, no coffee no tea   Right handed   Social Determinants of Radio broadcast assistant Strain:  Not on file  Food Insecurity: Not on file  Transportation Needs: Not on file  Physical Activity: Not on file  Stress: Not on file  Social Connections: Not on file     Family History:  The patient's ***family history includes Hypertension in his brother and mother; Kidney failure in his maternal grandmother.   ROS:   Please see the history of present illness.    ROS All other systems reviewed and are negative.   PHYSICAL EXAM:   VS:  There were no vitals taken for  this visit.  Physical Exam  GEN: Well nourished, well developed, in no acute distress  HEENT: normal  Neck: no JVD, carotid bruits, or masses Cardiac:RRR; no murmurs, rubs, or gallops  Respiratory:  clear to auscultation bilaterally, normal work of breathing GI: soft, nontender, nondistended, + BS Ext: without cyanosis, clubbing, or edema, Good distal pulses bilaterally MS: no deformity or atrophy  Skin: warm and dry, no rash Neuro:  Alert and Oriented x 3, Strength and sensation are intact Psych: euthymic mood, full affect  Wt Readings from Last 3 Encounters:  09/28/21 146 lb 9.6 oz (66.5 kg)  09/21/21 145 lb 3.2 oz (65.9 kg)  09/18/21 148 lb (67.1 kg)      Studies/Labs Reviewed:   EKG:  EKG is*** ordered today.  The ekg ordered today demonstrates ***  Recent Labs: 10/18/2020: ALT 14; BUN 11; Creatinine, Ser 1.16; Hemoglobin 15.4; Platelets 234.0; Potassium 4.1; Sodium 138; TSH 0.79   Lipid Panel    Component Value Date/Time   CHOL 233 (H) 12/29/2016 1036   TRIG 157 (H) 12/29/2016 1036   HDL 52 12/29/2016 1036   CHOLHDL 4.5 12/29/2016 1036   CHOLHDL 3.7 05/14/2015 0958   VLDL 25 05/14/2015 0958   LDLCALC 150 (H) 12/29/2016 1036    Additional studies/ records that were reviewed today include:   Cardiac monitor 08/22/2021 personally reviewed Review of the above records today demonstrates:  Sinus rhythm. Minimum HR of 54 bpm with average HR of 79 bpm.  4 Supraventricular Tachycardia runs occurred, the run with the fastest interval lasting 23 mins 38 secs with a max rate of 187 bpm  (avg 158 bpm); the run with the fastest interval was also the longest. Supraventricular Tachycardia was detected within +/- 45 seconds of symptomatic patient event(s) Rare premature atrial contractions Rare premature ventricular contractions   TTE 07/19/21  1. Left ventricular ejection fraction, by estimation, is 70 to 75%. Left  ventricular ejection fraction by 3D volume is 70 %. The left  ventricle has  hyperdynamic function. The left ventricle has no regional wall motion  abnormalities. Left ventricular  diastolic parameters are consistent with Grade I diastolic dysfunction  (impaired relaxation). The average left ventricular global longitudinal  strain is 23.5 %. The global longitudinal strain is normal.   2. Right ventricular systolic function is normal. The right ventricular  size is normal. There is normal pulmonary artery systolic pressure. The  estimated right ventricular systolic pressure is 24.8 mmHg.   3. No diagnostic prolapse. The mitral valve leaflets are mildly  thickened. Trivial mitral valve regurgitation.   4. The aortic valve is tricuspid. Aortic valve regurgitation is not  visualized.   5. The inferior vena cava is normal in size with greater than 50%  respiratory variability, suggesting right atrial pressure of 3 mmHg.    Risk Assessment/Calculations:   {Does this patient have ATRIAL FIBRILLATION?:[phone]}     ASSESSMENT:    No diagnosis found.  PLAN:  In order of problems listed above:  SVT-23 beats at 187/m on recent monitor. Patient currently on diltiazem CD and prn. He wants to hold off on ablation until treatement plan of recent prostate CA diagnosis.  Aortic and LAD Coronary artery calcification on PET scan incidental finding. Should be on ASA/statin  Shared Decision Making/Informed Consent   {Are you ordering a CV Procedure (e.g. stress test, cath, DCCV, TEE, etc)?   Press F2        :[id number]}    Medication Adjustments/Labs and Tests Ordered: Current medicines are reviewed at length with the patient today.  Concerns regarding medicines are outlined above.  Medication changes, Labs and Tests ordered today are listed in the Patient Instructions below. There are no Patient Instructions on file for this visit.   Sumner Boast, PA-C  09/29/2021 7:53 AM    [facility] [address], [place],  [place]  [address] Phone: [phone]; Fax: [phone]

## 2021-09-29 NOTE — Telephone Encounter (Signed)
Pt had PET scan as part of work up for prostate cancer.  He read the report and wanted to make Dr. Angelena Form aware of incidental finding of aortic and LAD coronary artery calcification.  He has follow up with Ermalinda Barrios on 6/14.

## 2021-10-03 ENCOUNTER — Telehealth: Payer: Self-pay

## 2021-10-03 NOTE — Telephone Encounter (Signed)
Patient called on the triage line today stating that he passed a possible blood clot today, describes it as dark brownish in color. Post prostate biopsy on 09/05/21. Patient denies fever, chills, dysuria, frequency, urgency. Patient states he was straining today lifting water bottles. Patient was encouraged to drink plenty of water and monitor symptoms for now. If he starts to pass more clots or develops additional symptoms he should call back for further evaluation. Patient agrees with this plan

## 2021-10-05 ENCOUNTER — Ambulatory Visit: Payer: PPO | Admitting: Family Medicine

## 2021-10-11 ENCOUNTER — Ambulatory Visit: Payer: PPO | Admitting: Physician Assistant

## 2021-10-11 DIAGNOSIS — I2510 Atherosclerotic heart disease of native coronary artery without angina pectoris: Secondary | ICD-10-CM

## 2021-10-11 DIAGNOSIS — I471 Supraventricular tachycardia: Secondary | ICD-10-CM

## 2021-10-13 NOTE — Progress Notes (Signed)
I called pt to introduce myself as the Prostate Nurse Navigator and the Coordinator of the Prostate [place].   1. I confirmed with the patient he is aware of his referral to the clinic 6/27, arriving @ 12:30 pm.    2. I discussed the format of the clinic and the physicians he will be seeing that day.   3. I discussed where the clinic is located and how to contact me.   4. I confirmed his address and informed him I would be mailing a packet of information and forms to be completed. I asked him to bring them with him the day of his appointment.    He voiced understanding of the above. I asked him to call me if he has any questions or concerns regarding his appointments or the forms he needs to complete.

## 2021-10-19 ENCOUNTER — Ambulatory Visit: Payer: PPO | Admitting: Urology

## 2021-10-24 ENCOUNTER — Other Ambulatory Visit: Payer: Self-pay | Admitting: Genetic Counselor

## 2021-10-24 ENCOUNTER — Inpatient Hospital Stay (HOSPITAL_BASED_OUTPATIENT_CLINIC_OR_DEPARTMENT_OTHER): Payer: PPO | Admitting: Oncology

## 2021-10-24 ENCOUNTER — Other Ambulatory Visit: Payer: Self-pay

## 2021-10-24 ENCOUNTER — Ambulatory Visit
Admission: RE | Admit: 2021-10-24 | Discharge: 2021-10-24 | Disposition: A | Discharge status: Home / Self Care | Payer: PPO | Source: Ambulatory Visit | Attending: Radiation Oncology | Admitting: Radiation Oncology

## 2021-10-24 ENCOUNTER — Encounter: Payer: Self-pay | Admitting: Genetic Counselor

## 2021-10-24 ENCOUNTER — Inpatient Hospital Stay: Payer: PPO | Admitting: Genetic Counselor

## 2021-10-24 ENCOUNTER — Inpatient Hospital Stay: Payer: PPO | Attending: Oncology

## 2021-10-24 VITALS — BP 115/71 | HR 81 | Temp 98.2°F | Resp 18 | Ht 70.0 in | Wt 144.2 lb

## 2021-10-24 DIAGNOSIS — C61 Malignant neoplasm of prostate: Secondary | ICD-10-CM

## 2021-10-24 DIAGNOSIS — Z1379 Encounter for other screening for genetic and chromosomal anomalies: Secondary | ICD-10-CM

## 2021-10-24 LAB — GENETIC SCREENING ORDER

## 2021-10-24 NOTE — Progress Notes (Signed)
REFERRING PROVIDER: Eli Hose, MD  PRIMARY PROVIDER:  Farris Has, MD  PRIMARY REASON FOR VISIT:  1. Malignant neoplasm of prostate (HCC)    HISTORY OF PRESENT ILLNESS:   William [name], a 61 y.o. male, was seen for a [place] cancer genetics consultation at the request of Dr. Clelia Croft due to a personal history of cancer.  William [name] presents to clinic today to discuss the possibility of a hereditary predisposition to cancer, to discuss genetic testing, and to further clarify his future cancer risks, as well as potential cancer risks for family members.   In May 2023, at the age of 40, William [name] was diagnosed with prostate cancer.   CANCER HISTORY:  Oncology History  Malignant neoplasm of prostate (HCC)  10/24/2021 Initial Diagnosis   Malignant neoplasm of prostate (HCC)   10/24/2021 Cancer Staging   Staging form: Prostate, AJCC 8th Edition - Clinical: Stage IIC (cT1c, cN0, cM0, PSA: 5.3, Grade Group: 3) - Signed by Benjiman Core, MD on 10/24/2021 Prostate specific antigen (PSA) range: Less than 10 Gleason score: 7 Histologic grading system: 5 grade system     Past Medical History:  Diagnosis Date   Allergy    Anxiety    Depression    Gastroparesis    Heart murmur    Migraine headache    Mitral valve prolapse    SVT (supraventricular tachycardia) (HCC)     No past surgical history on file.  Social History   Socioeconomic History   Marital status: Married    Spouse name: Not on file   Number of children: 0   Years of education: Not on file   Highest education level: Bachelor's degree (e.g., BA, AB, BS)  Occupational History   Not on file  Tobacco Use   Smoking status: Never   Smokeless tobacco: Never  Vaping Use   Vaping Use: Never used  Substance and Sexual Activity   Alcohol use: No    Alcohol/week: 0.0 standard drinks of alcohol   Drug use: No   Sexual activity: Yes    Partners: Female  Other Topics Concern   Not on file  Social History Narrative    Pt is married lives with spouse in 1 story apartment he has No children   BS degree- he is right handed   Drinks soda  Ginger ale, no coffee no tea   Right handed   Social Determinants of Corporate investment banker Strain: Not on file  Food Insecurity: Not on file  Transportation Needs: Not on file  Physical Activity: Not on file  Stress: Not on file  Social Connections: Not on file     FAMILY HISTORY:  We obtained a detailed, 4-generation family history.  Significant diagnoses are listed below: Family History  Problem Relation Age of Onset   Hypertension Mother    Lung cancer Father 32 - 79   Hypertension Brother    Kidney failure Maternal Grandmother    Prostate cancer Neg Hx    Kidney cancer Neg Hx      William [name]'s father was diagnosed with lung cancer in his early 63s and died at age 74, he smoked. Of note, he has limited information about his paternal family history and his father was an only child. William [name] is unaware of previous family history of genetic testing for hereditary cancer risks.   GENETIC COUNSELING ASSESSMENT: William [name] is a 61 y.o. male with a personal and family history of cancer which is somewhat  suggestive of a hereditary predisposition to cancer. We, therefore, discussed and recommended the following at today's visit.   DISCUSSION: We discussed that 5 - 10% of cancer is hereditary, with most cases of prostate cancer associated with BRCA1/2.  There are other genes that can be associated with hereditary prostate cancer syndromes.  We discussed that testing is beneficial for several reasons including knowing how to follow individuals after completing their treatment, identifying whether potential treatment options would be beneficial, and understanding if other family members could be at risk for cancer and allowing them to undergo genetic testing.   We reviewed the characteristics, features and inheritance patterns of hereditary cancer syndromes. We  also discussed genetic testing, including the appropriate family members to test, the process of testing, insurance coverage and turn-around-time for results. We discussed the implications of a negative, positive, carrier and/or variant of uncertain significant result. We recommended William [name] pursue genetic testing for a panel that includes genes associated with prostate cancer.   William [name] elected to have [facility] CancerNext Panel. The CancerNext gene panel offered by [employer] includes sequencing, rearrangement analysis, and RNA analysis for the following 36 genes:   APC, ATM, AXIN2, BARD1, BMPR1A, BRCA1, BRCA2, BRIP1, CDH1, CDK4, CDKN2A, CHEK2, DICER1, HOXB13, EPCAM, GREM1, MLH1, MSH2, MSH3, MSH6, MUTYH, NBN, NF1, NTHL1, PALB2, PMS2, POLD1, POLE, PTEN, RAD51C, RAD51D, RECQL, SMAD4, SMARCA4, STK11, and TP53.   Based on William [name]'s personal and family history of cancer, he does not meet medical criteria for genetic testing. He may have an out of pocket cost. We discussed that if his out of pocket cost for testing is over $100, the laboratory will call and confirm whether he wants to proceed with testing.  If the out of pocket cost of testing is less than $100 he will be billed by the genetic testing laboratory.   PLAN: After considering the risks, benefits, and limitations, William [name] provided informed consent to pursue genetic testing and the blood sample was sent to [facility] for analysis of the CancerNext Panel. Results should be available within approximately 2-3 weeks' time, at which point they will be disclosed by telephone to William [name], as will any additional recommendations warranted by these results. William [name] will receive a summary of his genetic counseling visit and a copy of his results once available. This information will also be available in Epic.   William [name] questions were answered to his satisfaction today. Our contact information was provided should additional questions  or concerns arise. Thank you for the referral and allowing [place] to share in the care of your patient.   Lalla Brothers, MS, [facility] Genetic Counselor [email or web address] (P) [phone]  The patient was seen for a total of 20 minutes in face-to-face genetic counseling.  The patient brought his wife. Drs. Pamelia Hoit and/or Mosetta Putt were available to discuss this case as needed.   _______________________________________________________________________ For Office Staff:  Number of people involved in session: 2 Was an Intern/ student involved with case: no

## 2021-10-24 NOTE — Progress Notes (Signed)
Radiation Oncology         [phone] ________________________________  Multidisciplinary Prostate Cancer Clinic  Initial Radiation Oncology Consultation  Name: William [name] MRN: [id number]  Date: 10/24/2021  DOB: [birth year]  CC:William Pepper, MD  [name], MD   REFERRING PHYSICIAN: [name], MD  DIAGNOSIS: 61 y.o. gentleman with stage T1c adenocarcinoma of the prostate with a Gleason's score of 4+3 and a PSA of 5.3    ICD-10-CM   1. Malignant neoplasm of prostate ([place])  [place] Abdirahman Chittum is a 61 y.o. gentleman. He has a history of chronic left testicular, left flank, pelvic, and scrotal pains. He has been seen by a number of different urology providers, but no etiology was found to explain his pain. More recently, he was noted to have an elevated PSA of 4.62 by his primary care physician.  Accordingly, he was referred for evaluation in urology by Dr. Erlene Quan on 08/09/21,  digital rectal examination was performed at that time revealing no nodules. Repeat PSA from that day showed further elevation to 5.3. The patient proceeded to transrectal ultrasound with 12 biopsies of the prostate on 09/05/21.  The prostate volume measured 52.8 cc.  Out of 12 core biopsies, 3 were positive.  The maximum Gleason score was 4+3, and this was seen in right lateral mid. Additionally, Gleason 3+4 was seen in right lateral base, and Gleason 3+3 in right mid.  He underwent PSMA PET scan on 09/26/21 showing: tracer-avid right-sided prostate primary; no evidence of tracer-avid nodal metastasis; subtle areas of bilateral rib low-level tracer affinity, favored to be physiologic, with no CT correlate.  The patient reviewed the biopsy results with his urologist and he has kindly been referred today to the multidisciplinary prostate cancer clinic for presentation of pathology and radiology studies in our conference for discussion of potential radiation treatment  options and clinical evaluation.    PREVIOUS RADIATION THERAPY: No  PAST MEDICAL HISTORY:  has a past medical history of Allergy, Anxiety, Depression, Gastroparesis, Heart murmur, Migraine headache, Mitral valve prolapse, and SVT (supraventricular tachycardia) ([place]).    PAST SURGICAL HISTORY:No past surgical history on file.  FAMILY HISTORY: family history includes Hypertension in his brother and mother; Kidney failure in his maternal grandmother; Lung cancer (age of onset: 14 - 13) in his father.  SOCIAL HISTORY:  reports that he has never smoked. He has never used smokeless tobacco. He reports that he does not drink alcohol and does not use drugs.  ALLERGIES: Erythromycin, Nalbuphine, Cefdinir, Cephalexin, Cephalosporins, Hydrocodone-acetaminophen, Ketorolac, Prochlorperazine edisylate, Restasis [cyclosporine], Tyloxapol, Bacitracin-polymyxin b, Codeine, Nalbuphine hcl, Prochlorperazine, Propranolol, Topamax [topiramate], Zetia [ezetimibe], Cefdinir, Doxycycline hyclate, Iodinated contrast media, Ketoconazole, Ketorolac tromethamine, Latex, Neosporin [neomycin-bacitracin zn-polymyx], Oxycodone-acetaminophen, and Prednisone  MEDICATIONS:  Current Outpatient Medications  Medication Sig Dispense Refill   baclofen (LIORESAL) 10 MG tablet Take 0.5 tablets (5 mg total) by mouth at bedtime as needed for muscle spasms. 30 each 0   diltiazem (CARDIZEM CD) 120 MG 24 hr capsule Take 1 capsule (120 mg total) by mouth daily. 90 capsule 3   diltiazem (CARDIZEM) 30 MG tablet TAKE (1) TABLET AS NEEDED FOR PALPITATIONS 30 tablet 11   EMGALITY 120 MG/ML SOAJ as directed.     fluticasone (FLONASE) 50 MCG/ACT nasal spray Place 2 sprays into both nostrils daily. 16 g 6   metoCLOPramide (REGLAN) 5 MG tablet Take 5 mg by mouth as needed.     ondansetron (ZOFRAN-ODT) 8 MG  disintegrating tablet Take 8 mg by mouth 3 (three) times daily as needed.     Rimegepant Sulfate (NURTEC) 75 MG TBDP Take 75 mg by mouth as  needed for up to 1 dose (Maximum 1 tablet in 24 hours.). 8 tablet 5   No current facility-administered medications for this encounter.      REVIEW OF SYSTEMS:  On review of systems, the patient reports that he is doing well overall. He denies any chest pain, shortness of breath, cough, fevers, chills, night sweats, unintended weight changes. He denies any bowel disturbances, and denies abdominal pain, nausea or vomiting. He denies any new musculoskeletal or joint aches or pains. His IPSS and SHIM werer recorded.   PHYSICAL EXAM:  Wt Readings from Last 3 Encounters:  10/26/21 138 lb (62.6 kg)  10/25/21 144 lb 3.2 oz (65.4 kg)  10/24/21 144 lb 3.2 oz (65.4 kg)   Temp Readings from Last 3 Encounters:  10/24/21 98.2 F (36.8 C) (Oral)  09/21/21 98.4 F (36.9 C)  06/24/18 98 F (36.7 C) (Oral)   BP Readings from Last 3 Encounters:  10/26/21 106/72  10/25/21 124/70  10/24/21 115/71   Pulse Readings from Last 3 Encounters:  10/26/21 98  10/25/21 79  10/24/21 81    /10  In general this is a well appearing male in no acute distress. He is alert and oriented x4 and appropriate throughout the examination. HEENT reveals that the patient is normocephalic, atraumatic. EOMs are intact. PERRLA. Skin is intact without any evidence of gross lesions. Cardiovascular exam reveals a regular rate and rhythm, no clicks rubs or murmurs are auscultated. Chest is clear to auscultation bilaterally. Lymphatic assessment is performed and does not reveal any adenopathy in the cervical, supraclavicular, axillary, or inguinal chains. Abdomen has active bowel sounds in all quadrants and is intact. The abdomen is soft, non tender, non distended. Lower extremities are negative for pretibial pitting edema, deep calf tenderness, cyanosis or clubbing.  KPS = 100  100 - Normal; no complaints; no evidence of disease. 90   - Able to carry on normal activity; minor signs or symptoms of disease. 80   - Normal activity  with effort; some signs or symptoms of disease. 33   - Cares for self; unable to carry on normal activity or to do active work. 60   - Requires occasional assistance, but is able to care for most of his personal needs. 50   - Requires considerable assistance and frequent medical care. 55   - Disabled; requires special care and assistance. 38   - Severely disabled; hospital admission is indicated although death not imminent. 13   - Very sick; hospital admission necessary; active supportive treatment necessary. 10   - Moribund; fatal processes progressing rapidly. 0     - Dead  Karnofsky DA, Abelmann [place], Craver LS and Burchenal [facility] [phone]) The use of the nitrogen mustards in the palliative treatment of carcinoma: with particular reference to bronchogenic carcinoma Cancer 1 634-56   LABORATORY DATA:  Lab Results  Component Value Date   WBC 7.3 10/18/2020   HGB 15.4 10/18/2020   HCT 46.3 10/18/2020   MCV 96.8 10/18/2020   PLT 234.0 10/18/2020   Lab Results  Component Value Date   NA 138 10/18/2020   K 4.1 10/18/2020   CL 103 10/18/2020   CO2 27 10/18/2020   Lab Results  Component Value Date   ALT 14 10/18/2020   AST 17 10/18/2020   ALKPHOS 64 10/18/2020  BILITOT 1.3 (H) 10/18/2020     RADIOGRAPHY: No results found.    IMPRESSION/PLAN: 61 y.o. gentleman with Stage T1c adenocarcinoma of the prostate with a Gleason score of 4+3 and a PSA of 5.3.    We discussed the patient's workup and outlined the nature of prostate cancer in this setting. The patient's T stage, Gleason's score, and PSA put him into the intermediate risk group. Accordingly, he is eligible for a variety of potential treatment options including brachytherapy, 5.5 weeks of external radiation, or prostatectomy. We discussed the available radiation techniques, and focused on the details and logistics of delivery. We discussed and outlined the risks, benefits, short and long-term effects associated with radiotherapy and  compared and contrasted these with prostatectomy. We discussed the role of SpaceOAR gel in reducing the rectal toxicity associated with radiotherapy. He appears to have a good understanding of his disease and our treatment recommendations which are of curative intent.  He was encouraged to ask questions that were answered to his/their stated satisfaction.  At the end of the conversation the patient is interested in moving forward with considering his options carefully.  He'll reach out to [facility] with follow-up questions and/or his decision.   We personally spent 60 minutes in this encounter including chart review, reviewing radiological studies, meeting face-to-face with the patient, entering orders and completing documentation.   ------------------------------------------------   Tyler Pita, MD [place]: [phone]  Fax: [phone] [email or web address]  Skype  [email or web address]   This document serves as a record of services personally performed by Tyler Pita, MD. It was created on his behalf by Wilburn Mylar, a trained medical scribe. The creation of this record is based on the scribe's personal observations and the provider's statements to them. This document has been checked and approved by the attending provider.

## 2021-10-25 ENCOUNTER — Encounter: Payer: Self-pay | Admitting: Cardiovascular Disease

## 2021-10-25 ENCOUNTER — Encounter: Payer: Self-pay | Admitting: General Practice

## 2021-10-25 ENCOUNTER — Ambulatory Visit (INDEPENDENT_AMBULATORY_CARE_PROVIDER_SITE_OTHER): Payer: PPO | Admitting: Cardiovascular Disease

## 2021-10-25 ENCOUNTER — Ambulatory Visit: Payer: PPO | Admitting: Cardiovascular Disease

## 2021-10-25 ENCOUNTER — Other Ambulatory Visit: Payer: PPO

## 2021-10-25 VITALS — BP 124/70 | HR 79 | Ht 70.0 in | Wt 144.2 lb

## 2021-10-25 DIAGNOSIS — I471 Supraventricular tachycardia: Secondary | ICD-10-CM

## 2021-10-25 MED ORDER — DILTIAZEM HCL 30 MG PO TABS: ORAL_TABLET | ORAL | 11 refills | Status: DC

## 2021-10-25 NOTE — Progress Notes (Signed)
[facility] [facility]  Initial Assessment   Tayvon Ekin Pilar is a 61 y.o. year old male contacted by phone. Clinical Social Work was referred by nurse navigator for assessment of psychosocial needs.   SDOH (Social Determinants of Health) assessments performed: Yes SDOH Interventions    Flowsheet Row Most Recent Value  SDOH Interventions   Food Insecurity Interventions Intervention Not Indicated  Financial Strain Interventions Intervention Not Indicated  Housing Interventions Intervention Not Indicated  Stress Interventions Provide Counseling  Transportation Interventions Intervention Not Indicated       SDOH Screenings   Alcohol Screen: Not on file  Depression (PHQ2-9): Low Risk  (07/24/2018)   Depression (PHQ2-9)    PHQ-2 Score: 0  Financial Resource Strain: Low Risk  (10/25/2021)   Overall Financial Resource Strain (CARDIA)    Difficulty of Paying Living Expenses: Not hard at all  Food Insecurity: No Food Insecurity (10/25/2021)   Hunger Vital Sign    Worried About Running Out of Food in the Last Year: Never true    Ran Out of Food in the Last Year: Never true  Housing: Low Risk  (10/25/2021)   Housing    Last Housing Risk Score: 0  Physical Activity: Not on file  Social Connections: Not on file  Stress: Stress Concern Present (10/25/2021)   [place]    Feeling of Stress : Rather much  Tobacco Use: Low Risk  (10/24/2021)   Patient History    Smoking Tobacco Use: Never    Smokeless Tobacco Use: Never    Passive Exposure: Not on file  Transportation Needs: No Transportation Needs (10/25/2021)   PRAPARE - Transportation    Lack of Transportation (Medical): No    Lack of Transportation (Non-Medical): No     Distress Screen completed: No    10/24/2021    3:59 PM  ONCBCN DISTRESS SCREENING  Screening Type Initial Screening  Distress experienced in past week (1-10) 10  Family Problem type Other  (comment)  Emotional problem type Nervousness/Anxiety;Adjusting to illness;Feeling hopeless  Spiritual/Religous concerns type Relating to God;Loss of Faith;Facing my mortality;Loss of sense of purpose  Physical Problem type Pain      Family/Social Information:  Housing Arrangement: patient lives with his wife and 44 y/o mother. Family members/support persons in your life? Family and Friends.  Patient has contacted friends regarding their experiences with prostate cancer. Transportation concerns: no  Employment: Disabled due to migraines.  Income source: Banker concerns: No Type of concern: None Food access concerns: no Religious or spiritual practice: Cloretta Ned is currently questioning his faith and has an appointment with the [employer]. Services Currently in place:  None  Coping/ Adjustment to diagnosis: Patient understands treatment plan and what happens next? Yes.  He is concerned about his mother being cared for when he decides which treatment he wants.  She requires assistance with bathing.  He has used a Home Care agency in the past.  CSW also provided information on possible respite care for his mother at an assisted living while he recovers from treatment. Concerns about diagnosis and/or treatment: How I will care for other members of my family Patient reported stressors: Childcare/ elder care, Adjusting to my illness, and Relating to God Hopes and/or priorities: Keeping my mother comfortable. Patient enjoys time with family/ friends Current coping skills/ strengths: Ability for insight , Average or above average intelligence , Capable of independent living , Communication skills , Financial means ,  General fund of knowledge , and Supportive family/friends     SUMMARY: Current SDOH Barriers:  Mental Health Concerns   Clinical Social Work Clinical Goal(s):  Patient will work with SW to address concerns related to caregiver  stress.  Interventions: Discussed common feeling and emotions when being diagnosed with cancer, and the importance of support during treatment Informed patient of the support team roles and support services at [facility] Provided CSW contact information and encouraged patient to call with any questions or concerns Provided patient with information about Prostate Cancer Support Group.  CSW to mail patient Monthly Cancer Support Group list.   Follow Up Plan: CSW to contact patient by phone at 11am on 10/30/21 to provide adjustment counseling. Patient verbalizes understanding of plan: Yes    Rodman Pickle Jayln Branscom, LCSW

## 2021-10-25 NOTE — Progress Notes (Signed)
[place] Spiritual Care Note  Referred by prostate navigator Becky Sax for spiritual support per [facility] request. Reached him by phone and scheduled Spiritual Care Virtual Visit by Jackquline Denmark on Friday, June 30 at [facility] Shelva Majestic, [facility] Pager [phone] Voicemail [phone]

## 2021-10-25 NOTE — Patient Instructions (Signed)
Medication Instructions:  Your physician recommends that you continue on your current medications as directed. Please refer to the Current Medication list given to you today.  *If you need a refill on your cardiac medications before your next appointment, please call your pharmacy*   Follow-Up: At [facility], you and your health needs are our priority.  As part of our continuing mission to provide you with exceptional heart care, we have created designated Provider Care Teams.  These Care Teams include your primary Cardiologist (physician) and Advanced Practice Providers (APPs -  Physician Assistants and Nurse Practitioners) who all work together to provide you with the care you need, when you need it.   Your next appointment:   1 year(s)  The format for your next appointment:   In Person  Provider:   Lauree Chandler, MD     Other Instructions My  Chart # [phone]  Important Information About Sugar

## 2021-10-25 NOTE — Progress Notes (Deleted)
No chief complaint on file.   History of Present Illness: 61 yo male with history of palpitations, PVCs, PACs, SVT, migraine headaches, mitral valve prolapse and irritable bowel syndrome who is here today for cardiac follow up. He is known to have PVCs and SVT. He has not tolerated beta blockers. He had a normal echo and stress test in 2011.  Echo in 2014 with normal LV size and function. 48 hour monitor in 2014 with normal sinus rhythm and rare PACs. He was seen November 2018 in our office by Estella Husk, PA and had c/o palpitations. Cardiac event monitor November 2018 with sinus rhythm and one run of SVT (40 beats).  He was started on propranolol for his SVT by primary care but it caused diarrhea. I restarted his Cardizem CD when I saw him in February 2019. He was seen in our office October 2021 and reported more episodes of SVT. Cardiac monitor November 2021 with several short runs of SVT. He was seen in the EP clinic in November 2021 by Dr. Lennie Odor and he elected to continue Cardizem rather than move toward an ablation. I saw him in march 2023 and he had stopped taking Cardizem CD 180 mg daily due to some dizziness. He had been taking the short acting Diltiazem as needed and reported palpitations several days per week. Echo march 2023 with LVEF=70-75%. Trivial MR. Cardiac monitor April 2023 with 4 runs of SVT with longest at 23 minutes. He was seen by Dr. Lennie Odor 09/28/21 and plans were to continue medical therapy with Cardizem alone given recent diagnosis of prostate cancer.   He is here today for follow up. The patient denies any chest pain, dyspnea, palpitations, lower extremity edema, orthopnea, PND, dizziness, near syncope or syncope.     Primary Care Physician: [name], MD  Past Medical History:  Diagnosis Date   Allergy    Anxiety    Depression    Gastroparesis    Heart murmur    Migraine headache    Mitral valve prolapse    SVT (supraventricular tachycardia) (HCC)     No past surgical history on file.  Current Outpatient Medications  Medication Sig Dispense Refill   baclofen (LIORESAL) 10 MG tablet Take 10 mg by mouth 3 (three) times daily.     diltiazem (CARDIZEM CD) 120 MG 24 hr capsule Take 1 capsule (120 mg total) by mouth daily. 90 capsule 3   diltiazem (CARDIZEM) 30 MG tablet TAKE (1) TABLET AS NEEDED FOR PALPITATIONS 30 tablet 8   EMGALITY 120 MG/ML SOAJ as directed.     fluticasone (FLONASE) 50 MCG/ACT nasal spray Place 2 sprays into both nostrils daily. 16 g 6   metoCLOPramide (REGLAN) 5 MG tablet Take 5 mg by mouth as needed.     ondansetron (ZOFRAN-ODT) 8 MG disintegrating tablet Take 8 mg by mouth 3 (three) times daily as needed.     Rimegepant Sulfate (NURTEC) 75 MG TBDP Take 75 mg by mouth as needed for up to 1 dose (Maximum 1 tablet in 24 hours.). 8 tablet 5   No current facility-administered medications for this visit.    Allergies  Allergen Reactions   Erythromycin Anaphylaxis and Nausea And Vomiting   Nalbuphine Other (See Comments)    States loss of consciousness    Cefdinir Diarrhea, Nausea And Vomiting and Rash   Cephalexin Diarrhea and Nausea And Vomiting   Cephalosporins Diarrhea and Nausea And Vomiting   Hydrocodone-Acetaminophen Nausea And Vomiting   Ketorolac  Nausea And Vomiting    Other reaction(s): upset stomach   Prochlorperazine Edisylate Other (See Comments)    uncontrollable shake - tremor    Restasis [Cyclosporine] Other (See Comments)    migraines   Tyloxapol Swelling   Bacitracin-Polymyxin B     Other reaction(s): rash gets worse Other reaction(s): rash gets worse   Codeine Other (See Comments)    Other reaction(s): stomach upset   Nalbuphine Hcl     Other reaction(s): stomach upset   Prochlorperazine     Other reaction(s): tremors/vomiting   Propranolol Diarrhea   Topamax [Topiramate] Diarrhea and Nausea And Vomiting   Zetia [Ezetimibe] Diarrhea   Cefdinir Diarrhea, Nausea And Vomiting and Rash     Other reaction(s): too stronge   Doxycycline Hyclate Nausea Only and Other (See Comments)    abd pain   Iodinated Contrast Media Other (See Comments)    Lightheaded, flushed feeling (explained to patient these are normal side effects of IV iodinated contrast, not allergies, but he wanted this noted in the epic; he tolerated epidural steroid injections w/ contrast w/o difficulty)   Ketoconazole Itching   Ketorolac Tromethamine Rash    REACTION: Reaction not known   Latex Itching and Rash   Neosporin [Neomycin-Bacitracin Zn-Polymyx] Rash   Oxycodone-Acetaminophen Other (See Comments)    Tylox caused constipation   Prednisone Nausea And Vomiting    Oral route, only Other reaction(s): stomach spasms    Social History   Socioeconomic History   Marital status: Married    Spouse name: Not on file   Number of children: 0   Years of education: Not on file   Highest education level: Bachelor's degree (e.g., BA, AB, BS)  Occupational History   Not on file  Tobacco Use   Smoking status: Never   Smokeless tobacco: Never  Vaping Use   Vaping Use: Never used  Substance and Sexual Activity   Alcohol use: No    Alcohol/week: 0.0 standard drinks of alcohol   Drug use: No   Sexual activity: Yes    Partners: Female  Other Topics Concern   Not on file  Social History Narrative   Pt is married lives with spouse in 1 story apartment he has No children   BS degree- he is right handed   Drinks soda  Ginger ale, no coffee no tea   Right handed   Social Determinants of Radio broadcast assistant Strain: Not on file  Food Insecurity: Not on file  Transportation Needs: Not on file  Physical Activity: Not on file  Stress: Not on file  Social Connections: Not on file  Intimate Partner Violence: Not on file    Family History  Problem Relation Age of Onset   Hypertension Mother    Lung cancer Father 40 - 83   Hypertension Brother    Kidney failure Maternal Grandmother    Prostate  cancer Neg Hx    Kidney cancer Neg Hx     Review of Systems:  As stated in the HPI and otherwise negative.   There were no vitals taken for this visit.  Physical Examination:  General: Well developed, well nourished, NAD  HEENT: OP clear, mucus membranes moist  SKIN: warm, dry. No rashes. Neuro: No focal deficits  Musculoskeletal: Muscle strength 5/5 all ext  Psychiatric: Mood and affect normal  Neck: No JVD, no carotid bruits, no thyromegaly, no lymphadenopathy.  Lungs:Clear bilaterally, no wheezes, rhonci, crackles Cardiovascular: Regular rate and rhythm. No murmurs, gallops or  rubs. Abdomen:Soft. Bowel sounds present. Non-tender.  Extremities: No lower extremity edema. Pulses are 2 + in the bilateral DP/PT.  Echo 07/19/21:  1. Left ventricular ejection fraction, by estimation, is 70 to 75%. Left  ventricular ejection fraction by 3D volume is 70 %. The left ventricle has  hyperdynamic function. The left ventricle has no regional wall motion  abnormalities. Left ventricular  diastolic parameters are consistent with Grade I diastolic dysfunction  (impaired relaxation). The average left ventricular global longitudinal  strain is 23.5 %. The global longitudinal strain is normal.   2. Right ventricular systolic function is normal. The right ventricular  size is normal. There is normal pulmonary artery systolic pressure. The  estimated right ventricular systolic pressure is 63.3 mmHg.   3. No diagnostic prolapse. The mitral valve leaflets are mildly  thickened. Trivial mitral valve regurgitation.   4. The aortic valve is tricuspid. Aortic valve regurgitation is not  visualized.   5. The inferior vena cava is normal in size with greater than 50%  respiratory variability, suggesting right atrial pressure of 3 mmHg.   EKG:  EKG is *** ordered today. The ekg ordered today demonstrates   Recent Labs: No results found for requested labs within last 365 days.   Lipid Panel     Component Value Date/Time   CHOL 233 (H) 12/29/2016 1036   TRIG 157 (H) 12/29/2016 1036   HDL 52 12/29/2016 1036   CHOLHDL 4.5 12/29/2016 1036   CHOLHDL 3.7 05/14/2015 0958   VLDL 25 05/14/2015 0958   LDLCALC 150 (H) 12/29/2016 1036     Wt Readings from Last 3 Encounters:  10/24/21 144 lb 3.2 oz (65.4 kg)  09/28/21 146 lb 9.6 oz (66.5 kg)  09/21/21 145 lb 3.2 oz (65.9 kg)     Other studies Reviewed: Additional studies/ records that were reviewed today include: . Review of the above records demonstrates:   Assessment and Plan:   1. SVT: He is known to have SVT. He has been seen by EP in 2021 and the plan was to continue Cardizem CD. He has since stopped taking Cardizem CD 180 mg daily due to dizziness. He has been using short acting Diltiazem as needed. He is having almost daily palpitations. He is willing to try Cardizem CD 120 mg once daily. He will use short acting Diltiazem for breakthrough palpitations. I will arrange an echo to assess LVEF. If he does not tolerate Cardizem CD or has breakthrough palpitations, will need to arrange a cardiac monitor and then f/u with Dr. Lennie Odor. .   Current medicines are reviewed at length with the patient today.  The patient does not have concerns regarding medicines.  The following changes have been made:    Labs/ tests ordered today include:   No orders of the defined types were placed in this encounter.  Disposition:   F/U with me or office APP in 3 months.    Signed, Lauree Chandler, MD 10/25/2021 1:31 PM    [facility] [place], [place], [place]  [address] Phone: [phone]; Fax: [phone]

## 2021-10-25 NOTE — Progress Notes (Signed)
Chief Complaint  Patient presents with   Follow-up    SVT    History of Present Illness: 61 yo male with history of palpitations, PVCs, PACs, SVT, migraine headaches, mitral valve prolapse and irritable bowel syndrome who is here today for cardiac follow up. He is known to have PVCs and SVT. He has not tolerated beta blockers. He had a normal echo and stress test in 2011.  Echo in 2014 with normal LV size and function. 48 hour monitor in 2014 with normal sinus rhythm and rare PACs. He was seen November 2018 in our office by Estella Husk, PA and had c/o palpitations. Cardiac event monitor November 2018 with sinus rhythm and one run of SVT (40 beats).  He was started on propranolol for his SVT by primary care but it caused diarrhea. I restarted his Cardizem CD when I saw him in February 2019. He was seen in our office October 2021 and reported more episodes of SVT. Cardiac monitor November 2021 with several short runs of SVT. He was seen in the EP clinic in November 2021 by Dr. Lennie Odor and he elected to continue Cardizem rather than move toward an ablation. I saw him in march 2023 and he had stopped taking Cardizem CD 180 mg daily due to some dizziness. He had been taking the short acting Diltiazem as needed and reported palpitations several days per week. Echo march 2023 with LVEF=70-75%. Trivial MR. Cardiac monitor April 2023 with 4 runs of SVT with longest at 23 minutes. He was seen by Dr. Lennie Odor 09/28/21 and plans were to continue medical therapy with Cardizem alone given recent diagnosis of prostate cancer.    He is here today for follow up. The patient denies any chest pain, dyspnea, lower extremity edema, orthopnea, PND, dizziness, near syncope or syncope. Several short episodes of palpitations over the past month but easily broken with rest and short acting Cardizem. He is trying to decide on treatment for his prostate cancer, radiation vs prostatectomy.      Primary Care Physician:  [name], MD  Past Medical History:  Diagnosis Date   Allergy    Anxiety    Depression    Gastroparesis    Heart murmur    Migraine headache    Mitral valve prolapse    SVT (supraventricular tachycardia) ([place])    History reviewed. No pertinent surgical history.  Current Outpatient Medications  Medication Sig Dispense Refill   baclofen (LIORESAL) 10 MG tablet Take 10 mg by mouth 3 (three) times daily.     diltiazem (CARDIZEM CD) 120 MG 24 hr capsule Take 1 capsule (120 mg total) by mouth daily. 90 capsule 3   EMGALITY 120 MG/ML SOAJ as directed.     fluticasone (FLONASE) 50 MCG/ACT nasal spray Place 2 sprays into both nostrils daily. 16 g 6   metoCLOPramide (REGLAN) 5 MG tablet Take 5 mg by mouth as needed.     ondansetron (ZOFRAN-ODT) 8 MG disintegrating tablet Take 8 mg by mouth 3 (three) times daily as needed.     Rimegepant Sulfate (NURTEC) 75 MG TBDP Take 75 mg by mouth as needed for up to 1 dose (Maximum 1 tablet in 24 hours.). 8 tablet 5   diltiazem (CARDIZEM) 30 MG tablet TAKE (1) TABLET AS NEEDED FOR PALPITATIONS 30 tablet 11   No current facility-administered medications for this visit.    Allergies  Allergen Reactions   Erythromycin Anaphylaxis and Nausea And Vomiting   Nalbuphine Other (See Comments)  States loss of consciousness    Cefdinir Diarrhea, Nausea And Vomiting and Rash   Cephalexin Diarrhea and Nausea And Vomiting   Cephalosporins Diarrhea and Nausea And Vomiting   Hydrocodone-Acetaminophen Nausea And Vomiting   Ketorolac Nausea And Vomiting    Other reaction(s): upset stomach   Prochlorperazine Edisylate Other (See Comments)    uncontrollable shake - tremor    Restasis [Cyclosporine] Other (See Comments)    migraines   Tyloxapol Swelling   Bacitracin-Polymyxin B     Other reaction(s): rash gets worse Other reaction(s): rash gets worse   Codeine Other (See Comments)    Other reaction(s): stomach upset   Nalbuphine Hcl     Other  reaction(s): stomach upset   Prochlorperazine     Other reaction(s): tremors/vomiting   Propranolol Diarrhea   Topamax [Topiramate] Diarrhea and Nausea And Vomiting   Zetia [Ezetimibe] Diarrhea   Cefdinir Diarrhea, Nausea And Vomiting and Rash    Other reaction(s): too stronge   Doxycycline Hyclate Nausea Only and Other (See Comments)    abd pain   Iodinated Contrast Media Other (See Comments)    Lightheaded, flushed feeling (explained to patient these are normal side effects of IV iodinated contrast, not allergies, but he wanted this noted in the epic; he tolerated epidural steroid injections w/ contrast w/o difficulty)   Ketoconazole Itching   Ketorolac Tromethamine Rash    REACTION: Reaction not known   Latex Itching and Rash   Neosporin [Neomycin-Bacitracin Zn-Polymyx] Rash   Oxycodone-Acetaminophen Other (See Comments)    Tylox caused constipation   Prednisone Nausea And Vomiting    Oral route, only Other reaction(s): stomach spasms    Social History   Socioeconomic History   Marital status: Married    Spouse name: Not on file   Number of children: 0   Years of education: Not on file   Highest education level: Bachelor's degree (e.g., BA, AB, BS)  Occupational History   Not on file  Tobacco Use   Smoking status: Never   Smokeless tobacco: Never  Vaping Use   Vaping Use: Never used  Substance and Sexual Activity   Alcohol use: No    Alcohol/week: 0.0 standard drinks of alcohol   Drug use: No   Sexual activity: Yes    Partners: Female  Other Topics Concern   Not on file  Social History Narrative   Pt is married lives with spouse in 1 story apartment he has No children   BS degree- he is right handed   Drinks soda  Ginger ale, no coffee no tea   Right handed   Social Determinants of Health   Financial Resource Strain: Low Risk  (10/25/2021)   Overall Financial Resource Strain (CARDIA)    Difficulty of Paying Living Expenses: Not hard at all  Food Insecurity:  No Food Insecurity (10/25/2021)   Hunger Vital Sign    Worried About Running Out of Food in the Last Year: Never true    Ran Out of Food in the Last Year: Never true  Transportation Needs: No Transportation Needs (10/25/2021)   PRAPARE - Hydrologist (Medical): No    Lack of Transportation (Non-Medical): No  Physical Activity: Not on file  Stress: Stress Concern Present (10/25/2021)   [place]    Feeling of Stress : Rather much  Social Connections: Not on file  Intimate Partner Violence: Not on file    Family History  Problem Relation Age of Onset   Hypertension Mother    Lung cancer Father 12 - 39   Hypertension Brother    Kidney failure Maternal Grandmother    Prostate cancer Neg Hx    Kidney cancer Neg Hx     Review of Systems:  As stated in the HPI and otherwise negative.   BP 124/70   Pulse 79   Ht '5\' 10"'$  (1.778 m)   Wt 144 lb 3.2 oz (65.4 kg)   SpO2 98%   BMI 20.69 kg/m   Physical Examination: General: Well developed, well nourished, NAD  HEENT: OP clear, mucus membranes moist  SKIN: warm, dry. No rashes. Neuro: No focal deficits  Musculoskeletal: Muscle strength 5/5 all ext  Psychiatric: Mood and affect normal  Neck: No JVD, no carotid bruits, no thyromegaly, no lymphadenopathy.  Lungs:Clear bilaterally, no wheezes, rhonci, crackles Cardiovascular: Regular rate and rhythm. No murmurs, gallops or rubs. Abdomen:Soft. Bowel sounds present. Non-tender.  Extremities: No lower extremity edema. Pulses are 2 + in the bilateral DP/PT.   Echo 07/19/21:  1. Left ventricular ejection fraction, by estimation, is 70 to 75%. Left  ventricular ejection fraction by 3D volume is 70 %. The left ventricle has  hyperdynamic function. The left ventricle has no regional wall motion  abnormalities. Left ventricular  diastolic parameters are consistent with Grade I diastolic dysfunction   (impaired relaxation). The average left ventricular global longitudinal  strain is 23.5 %. The global longitudinal strain is normal.   2. Right ventricular systolic function is normal. The right ventricular  size is normal. There is normal pulmonary artery systolic pressure. The  estimated right ventricular systolic pressure is 71.6 mmHg.   3. No diagnostic prolapse. The mitral valve leaflets are mildly  thickened. Trivial mitral valve regurgitation.   4. The aortic valve is tricuspid. Aortic valve regurgitation is not  visualized.   5. The inferior vena cava is normal in size with greater than 50%  respiratory variability, suggesting right atrial pressure of 3 mmHg.    EKG:  EKG is not ordered today. The ekg ordered today demonstrates   Recent Labs: No results found for requested labs within last 365 days.   Lipid Panel    Component Value Date/Time   CHOL 233 (H) 12/29/2016 1036   TRIG 157 (H) 12/29/2016 1036   HDL 52 12/29/2016 1036   CHOLHDL 4.5 12/29/2016 1036   CHOLHDL 3.7 05/14/2015 0958   VLDL 25 05/14/2015 0958   LDLCALC 150 (H) 12/29/2016 1036     Wt Readings from Last 3 Encounters:  10/25/21 144 lb 3.2 oz (65.4 kg)  10/24/21 144 lb 3.2 oz (65.4 kg)  09/28/21 146 lb 9.6 oz (66.5 kg)     Other studies Reviewed: Additional studies/ records that were reviewed today include: . Review of the above records demonstrates:   Assessment and Plan:   1. SVT:   He is known to have SVT. Echo March 2023 with LVEF=70-75%. Trivial MR. Cardiac monitor April 2023 with 4 runs of SVT with longest at 23 minutes. He was seen by Dr. Lennie Odor 09/28/21 and plans were to continue medical therapy with Cardizem alone given recent diagnosis of prostate cancer. He has had several short episodes of palpitations but these break with short acting Caridzem. Will continue Cardizem CD 120 mg daily with short acting Cardizem for episodes of SVT. If he has long runs of symptomatic SVT, will need  consideration for SVT ablation.   Current medicines are reviewed at  length with the patient today.  The patient does not have concerns regarding medicines.  The following changes have been made:    Labs/ tests ordered today include:   No orders of the defined types were placed in this encounter.  Disposition:   F/U with me or office APP in 12 months.    Signed, Lauree Chandler, MD 10/25/2021 4:08 PM    [facility] [place], [place], [place]  [address] Phone: [phone]; Fax: [phone]

## 2021-10-25 NOTE — Progress Notes (Deleted)
William [name] [name] Phone: [phone] Subjective:    I'm seeing this patient by the request  of:  [name], MD  CC:   [id number]  09/18/2021 Patient does have what appears to be more prostate cancer.  With the Pain we discussed the potential of the Doppler but patient is actually feeling better.  Feels like it could be potentially coming from his back.  We discussed with patient different treatment options and patient is elected to go with more x-rays to make sure there is nothing else that could be contributing.  We discussed home exercises and continue to stay active.  Encourage patient to continue to follow-up with his urologist and is awaiting a bone scan at the moment.  Patient can call us if he has any other significant questions or worsening pain.  Patient knows if any swelling in the calf or any worsening pain to seek medical attention in going for Doppler.  Patient voiced understanding.  Follow-up with me again in 6 to 8 weeks otherwise.  Total time reviewing patient's outside notes as well as discussing with patient 36 minutes  Updated 10/26/2021 William [name] is a 61 y.o. male coming in with complaint of right leg pain       Past Medical History:  Diagnosis Date   Allergy    Anxiety    Depression    Gastroparesis    Heart murmur    Migraine headache    Mitral valve prolapse    SVT (supraventricular tachycardia) (HCC)    No past surgical history on file. Social History   Socioeconomic History   Marital status: Married    Spouse name: Not on file   Number of children: 0   Years of education: Not on file   Highest education level: Bachelor's degree (e.g., BA, AB, BS)  Occupational History   Not on file  Tobacco Use   Smoking status: Never   Smokeless tobacco: Never  Vaping Use   Vaping Use: Never used  Substance and Sexual Activity   Alcohol use: No    Alcohol/week: 0.0 standard  drinks of alcohol   Drug use: No   Sexual activity: Yes    Partners: Female  Other Topics Concern   Not on file  Social History Narrative   Pt is married lives with spouse in 1 story apartment he has No children   BS degree- he is right handed   Drinks soda  Ginger ale, no coffee no tea   Right handed   Social Determinants of Radio broadcast assistant Strain: Not on file  Food Insecurity: Not on file  Transportation Needs: Not on file  Physical Activity: Not on file  Stress: Not on file  Social Connections: Not on file   Allergies  Allergen Reactions   Erythromycin Anaphylaxis and Nausea And Vomiting   Nalbuphine Other (See Comments)    States loss of consciousness    Cefdinir Diarrhea, Nausea And Vomiting and Rash   Cephalexin Diarrhea and Nausea And Vomiting   Cephalosporins Diarrhea and Nausea And Vomiting   Hydrocodone-Acetaminophen Nausea And Vomiting   Ketorolac Nausea And Vomiting    Other reaction(s): upset stomach   Prochlorperazine Edisylate Other (See Comments)    uncontrollable shake - tremor    Restasis [Cyclosporine] Other (See Comments)    migraines   Tyloxapol Swelling   Bacitracin-Polymyxin B     Other reaction(s): rash gets worse Other reaction(s): rash  gets worse   Codeine Other (See Comments)    Other reaction(s): stomach upset   Nalbuphine Hcl     Other reaction(s): stomach upset   Prochlorperazine     Other reaction(s): tremors/vomiting   Propranolol Diarrhea   Topamax [Topiramate] Diarrhea and Nausea And Vomiting   Zetia [Ezetimibe] Diarrhea   Cefdinir Diarrhea, Nausea And Vomiting and Rash    Other reaction(s): too stronge   Doxycycline Hyclate Nausea Only and Other (See Comments)    abd pain   Iodinated Contrast Media Other (See Comments)    Lightheaded, flushed feeling (explained to patient these are normal side effects of IV iodinated contrast, not allergies, but he wanted this noted in the epic; he tolerated epidural steroid  injections w/ contrast w/o difficulty)   Ketoconazole Itching   Ketorolac Tromethamine Rash    REACTION: Reaction not known   Latex Itching and Rash   Neosporin [Neomycin-Bacitracin Zn-Polymyx] Rash   Oxycodone-Acetaminophen Other (See Comments)    Tylox caused constipation   Prednisone Nausea And Vomiting    Oral route, only Other reaction(s): stomach spasms   Family History  Problem Relation Age of Onset   Hypertension Mother    Lung cancer Father 31 - 67   Hypertension Brother    Kidney failure Maternal Grandmother    Prostate cancer Neg Hx    Kidney cancer Neg Hx      Current Outpatient Medications (Cardiovascular):    diltiazem (CARDIZEM CD) 120 MG 24 hr capsule, Take 1 capsule (120 mg total) by mouth daily.   diltiazem (CARDIZEM) 30 MG tablet, TAKE (1) TABLET AS NEEDED FOR PALPITATIONS  Current Outpatient Medications (Respiratory):    fluticasone (FLONASE) 50 MCG/ACT nasal spray, Place 2 sprays into both nostrils daily.  Current Outpatient Medications (Analgesics):    EMGALITY 120 MG/ML SOAJ, as directed.   Rimegepant Sulfate (NURTEC) 75 MG TBDP, Take 75 mg by mouth as needed for up to 1 dose (Maximum 1 tablet in 24 hours.).   Current Outpatient Medications (Other):    baclofen (LIORESAL) 10 MG tablet, Take 10 mg by mouth 3 (three) times daily.   metoCLOPramide (REGLAN) 5 MG tablet, Take 5 mg by mouth as needed.   ondansetron (ZOFRAN-ODT) 8 MG disintegrating tablet, Take 8 mg by mouth 3 (three) times daily as needed.   Reviewed prior external information including notes and imaging from  primary care provider As well as notes that were available from care everywhere and other healthcare systems.  Past medical history, social, surgical and family history all reviewed in electronic medical record.  No pertanent information unless stated regarding to the chief complaint.   Review of Systems:  No headache, visual changes, nausea, vomiting, diarrhea, constipation,  dizziness, abdominal pain, skin rash, fevers, chills, night sweats, weight loss, swollen lymph nodes, body aches, joint swelling, chest pain, shortness of breath, mood changes. POSITIVE muscle aches  Objective  There were no vitals taken for this visit.   General: No apparent distress alert and oriented x3 mood and affect normal, dressed appropriately.  HEENT: Pupils equal, extraocular movements intact  Respiratory: Patient's speak in full sentences and does not appear short of breath  Cardiovascular: No lower extremity edema, non tender, no erythema      Impression and Recommendations:

## 2021-10-26 ENCOUNTER — Ambulatory Visit: Payer: Self-pay

## 2021-10-26 ENCOUNTER — Ambulatory Visit: Payer: PPO | Admitting: Family Medicine

## 2021-10-26 ENCOUNTER — Ambulatory Visit (INDEPENDENT_AMBULATORY_CARE_PROVIDER_SITE_OTHER): Payer: PPO

## 2021-10-26 ENCOUNTER — Telehealth: Payer: Self-pay | Admitting: Urology

## 2021-10-26 ENCOUNTER — Encounter: Payer: Self-pay | Admitting: Urology

## 2021-10-26 ENCOUNTER — Telehealth: Payer: Self-pay | Admitting: Radiation Oncology

## 2021-10-26 ENCOUNTER — Encounter: Payer: Self-pay | Admitting: Family Medicine

## 2021-10-26 VITALS — BP 106/72 | HR 98 | Ht 70.0 in | Wt 138.0 lb

## 2021-10-26 DIAGNOSIS — M6289 Other specified disorders of muscle: Secondary | ICD-10-CM | POA: Diagnosis not present

## 2021-10-26 DIAGNOSIS — M79604 Pain in right leg: Secondary | ICD-10-CM

## 2021-10-26 MED ORDER — METHYLPREDNISOLONE ACETATE 40 MG/ML IJ SUSP
40.0000 mg | Freq: Once | INTRAMUSCULAR | Status: AC
  Administered 2021-10-26: 40 mg via INTRAMUSCULAR

## 2021-10-26 MED ORDER — BACLOFEN 10 MG PO TABS
5.0000 mg | ORAL_TABLET | Freq: Every evening | ORAL | 0 refills | Status: DC | PRN
Start: 2021-10-26 — End: 2023-10-22

## 2021-10-26 MED ORDER — KETOROLAC TROMETHAMINE 30 MG/ML IJ SOLN
30.0000 mg | Freq: Once | INTRAMUSCULAR | Status: AC
  Administered 2021-10-26: 30 mg via INTRAMUSCULAR

## 2021-10-26 NOTE — Progress Notes (Signed)
[place] Phone: [phone] Subjective:   William [name], am serving as a scribe for Dr. Hulan Saas.  I'm seeing this patient by the request  of:  [name], MD  CC: Leg pain follow-up  [id number]  09/18/2021 Patient does have what appears to be more prostate cancer.  With the Pain we discussed the potential of the Doppler but patient is actually feeling better.  Feels like it could be potentially coming from his back.  We discussed with patient different treatment options and patient is elected to go with more x-rays to make sure there is nothing else that could be contributing.  We discussed home exercises and continue to stay active.  Encourage patient to continue to follow-up with his urologist and is awaiting a bone scan at the moment.  Patient can call us if he has any other significant questions or worsening pain.  Patient knows if any swelling in the calf or any worsening pain to seek medical attention in going for Doppler.  Patient voiced understanding.  Follow-up with me again in 6 to 8 weeks otherwise.  Total time reviewing patient's outside notes as well as discussing with patient 36 minutes   Update 10/26/2021 William [name] is a 61 y.o. male coming in with complaint of R leg pain. Patient states that R leg is doing better. L leg is now bothering him. Currently doing pelvic floor PT to prepare for possible surgery.   Here today main for increase in back pain last night. He fell asleep on his bed on his stomach but with hips and legs were hanging off the bed. Pain radiating down L leg.  Patient states otherwise has been doing okay overall.  Still has not been very active.  Getting ready for some type of intervention to go with his prostate cancer.  Reviewed patient's PET scan that shows that it is localized at this time.  [name] other imaging studies have been done since we have seen patient  otherwise.       Past Medical History:  Diagnosis Date   Allergy    Anxiety    Depression    Gastroparesis    Heart murmur    Migraine headache    Mitral valve prolapse    SVT (supraventricular tachycardia) (HCC)    [name] past surgical history on file. Social History   Socioeconomic History   Marital status: Married    Spouse name: Not on file   Number of children: 0   Years of education: Not on file   Highest education level: Bachelor's degree (e.g., BA, AB, BS)  Occupational History   Not on file  Tobacco Use   Smoking status: Never   Smokeless tobacco: Never  Vaping Use   Vaping Use: Never used  Substance and Sexual Activity   Alcohol use: [name]    Alcohol/week: 0.0 standard drinks of alcohol   Drug use: [name]   Sexual activity: Yes    Partners: Female  Other Topics Concern   Not on file  Social History Narrative   Pt is married lives with spouse in 1 story apartment he has [name] children   BS degree- he is right handed   Drinks soda  Ginger ale, [name] coffee [name] tea   Right handed   Social Determinants of Health   Financial Resource Strain: Low Risk  (10/25/2021)   Overall Financial Resource Strain (CARDIA)    Difficulty of Paying  Living Expenses: Not hard at all  Food Insecurity: [name] Food Insecurity (10/25/2021)   Hunger Vital Sign    Worried About Running Out of Food in the Last Year: Never true    Ran Out of Food in the Last Year: Never true  Transportation Needs: [name] Transportation Needs (10/25/2021)   PRAPARE - Hydrologist (Medical): [name]    Lack of Transportation (Non-Medical): [name]  Physical Activity: Not on file  Stress: Stress Concern Present (10/25/2021)   [place]    Feeling of Stress : Rather much  Social Connections: Not on file   Allergies  Allergen Reactions   Erythromycin Anaphylaxis and Nausea And Vomiting   Nalbuphine Other (See Comments)    States loss of  consciousness    Cefdinir Diarrhea, Nausea And Vomiting and Rash   Cephalexin Diarrhea and Nausea And Vomiting   Cephalosporins Diarrhea and Nausea And Vomiting   Hydrocodone-Acetaminophen Nausea And Vomiting   Ketorolac Nausea And Vomiting    Other reaction(s): upset stomach   Prochlorperazine Edisylate Other (See Comments)    uncontrollable shake - tremor    Restasis [Cyclosporine] Other (See Comments)    migraines   Tyloxapol Swelling   Bacitracin-Polymyxin B     Other reaction(s): rash gets worse Other reaction(s): rash gets worse   Codeine Other (See Comments)    Other reaction(s): stomach upset   Nalbuphine Hcl     Other reaction(s): stomach upset   Prochlorperazine     Other reaction(s): tremors/vomiting   Propranolol Diarrhea   Topamax [Topiramate] Diarrhea and Nausea And Vomiting   Zetia [Ezetimibe] Diarrhea   Cefdinir Diarrhea, Nausea And Vomiting and Rash    Other reaction(s): too stronge   Doxycycline Hyclate Nausea Only and Other (See Comments)    abd pain   Iodinated Contrast Media Other (See Comments)    Lightheaded, flushed feeling (explained to patient these are normal side effects of IV iodinated contrast, not allergies, but he wanted this noted in the epic; he tolerated epidural steroid injections w/ contrast w/o difficulty)   Ketoconazole Itching   Ketorolac Tromethamine Rash    REACTION: Reaction not known   Latex Itching and Rash   Neosporin [Neomycin-Bacitracin Zn-Polymyx] Rash   Oxycodone-Acetaminophen Other (See Comments)    Tylox caused constipation   Prednisone Nausea And Vomiting    Oral route, only Other reaction(s): stomach spasms   Family History  Problem Relation Age of Onset   Hypertension Mother    Lung cancer Father 103 - 37   Hypertension Brother    Kidney failure Maternal Grandmother    Prostate cancer Neg Hx    Kidney cancer Neg Hx      Current Outpatient Medications (Cardiovascular):    diltiazem (CARDIZEM CD) 120 MG 24 hr  capsule, Take 1 capsule (120 mg total) by mouth daily.   diltiazem (CARDIZEM) 30 MG tablet, TAKE (1) TABLET AS NEEDED FOR PALPITATIONS  Current Outpatient Medications (Respiratory):    fluticasone (FLONASE) 50 MCG/ACT nasal spray, Place 2 sprays into both nostrils daily.  Current Outpatient Medications (Analgesics):    EMGALITY 120 MG/ML SOAJ, as directed.   Rimegepant Sulfate (NURTEC) 75 MG TBDP, Take 75 mg by mouth as needed for up to 1 dose (Maximum 1 tablet in 24 hours.).   Current Outpatient Medications (Other):    metoCLOPramide (REGLAN) 5 MG tablet, Take 5 mg by mouth as needed.   ondansetron (ZOFRAN-ODT) 8 MG disintegrating tablet,  Take 8 mg by mouth 3 (three) times daily as needed.   baclofen (LIORESAL) 10 MG tablet, Take 0.5 tablets (5 mg total) by mouth at bedtime as needed for muscle spasms.    Review of Systems:  [name] headache, visual changes, nausea, vomiting, diarrhea, constipation, dizziness, abdominal pain, skin rash, fevers, chills, night sweats, weight loss, swollen lymph nodes, joint swelling, chest pain, shortness of breath, mood changes. POSITIVE muscle aches, body aches  Objective  Blood pressure 106/72, pulse 98, height '5\' 10"'$  (1.778 m), weight 138 lb (62.6 kg), SpO2 98 %.   General: [name] apparent distress alert and oriented x3 mood and affect normal, dressed appropriately.  HEENT: Pupils equal, extraocular movements intact  Respiratory: Patient's speak in full sentences and does not appear short of breath  Cardiovascular: [name] lower extremity edema, non tender, [name] erythema  Back exam does have some loss of lordosis.  Still has some pain with any type of rotation of the hips bilaterally.  Patient does have significant tightness of the hip flexor on the left side compared to the right.  Patient does have some voluntary guarding noted as well.    Impression and Recommendations:    The above documentation has been reviewed and is accurate and complete Lyndal Pulley,  DO

## 2021-10-26 NOTE — Telephone Encounter (Signed)
Pt called office to cancel appt w/Brandon.  He is transferring his care to [facility] in [place] post cancer dx.  He also wanted Dr Erlene Quan to know what a wonderful doctor she was.

## 2021-10-26 NOTE — Telephone Encounter (Signed)
.  Called patient to schedule appointment per 6/28 inbasket, patient is aware of date and time.   

## 2021-10-26 NOTE — Assessment & Plan Note (Signed)
Continues to have difficulty with the pelvic floor.  Has had a prostate cancer now and is being worked up for that.  Patient is doing physical therapy and I think will be helpful.  Increasing tightness in the hip flexor noted.  Toradol and Depo-Medrol given today that I think will be helpful.  Baclofen given for any type of breakthrough pain.  Do not feel that a course of prednisone is necessary at this time and hopefully will make significant improvement otherwise.  Follow-up with me again in 6 to 8 weeks

## 2021-10-26 NOTE — Patient Instructions (Signed)
Baclofen '5mg'$  30 See you again in 6 weeks

## 2021-10-27 ENCOUNTER — Encounter: Payer: Self-pay | Admitting: General Practice

## 2021-10-27 NOTE — Progress Notes (Signed)
[facility] Spiritual Care Note  William [name] was unable to join scheduled Webex. Left voicemail with direct number to reschedule appointment.   [place], [place], [facility] Pager [phone] Voicemail [phone]

## 2021-10-30 ENCOUNTER — Encounter: Payer: Self-pay | Admitting: General Practice

## 2021-10-30 NOTE — Progress Notes (Signed)
[place] Note  Met with Mr Sautter, who goes by [name]," by phone as requested. He is processing feelings and theological questions related to his diagnosis in the setting of other life stressors, such as caregiving for his mom.  We talked about the potential pivot from, "Why is this happening?" to "How can I learn and grow through this?" which can give a regained sense of agency and control.  Greg's next steps in his self-care plan include eating a healthy dinner, giving himself gentle space for better sleep (which has been limited lately--and which he knows to address with his doctor if he needs ongoing support), and spending time in [facility] tomorrow for a dose of beauty and peace of mind.  He is already considering his next theological questions for an upcoming [place] visit and plans to call as needed to schedule another conversation.   [place], [place], [facility] Pager [phone] Voicemail [phone]

## 2021-10-30 NOTE — Progress Notes (Signed)
[place] Spiritual Care Note  Received and returned voicemail to reschedule Spiritual Care Virtual Visit, leaving several appointment options for this week. Await a return call to finalize details.   [place], [place], [facility] Pager [phone] Voicemail [phone]

## 2021-11-01 ENCOUNTER — Ambulatory Visit: Payer: PPO | Admitting: Urology

## 2021-11-01 NOTE — Progress Notes (Signed)
Patient presented to [facility] on 10/24/2021 for stage T1c adenocarcinoma of the prostate with a Gleason score of 4+3 and a PSA of 5.3.    Patient is still currently undecided regarding treatment.   RN provided education on brachytherapy, as patient is leaning towards this option.  All questions answered.    Patient will call back later this week with decision or any additional questions or concerns.

## 2021-11-02 ENCOUNTER — Encounter: Payer: Self-pay | Admitting: Family Medicine

## 2021-11-02 NOTE — Progress Notes (Signed)
Patient presented to [facility] on 6/27 for stage T1c adenocarcinoma of the prostate with a Gleason score of 4+3 and a PSA of 5.3.  Patient has decided to proceed with brachytherapy.  MD's notified. Pending surgery date at this time.  Will continue to follow up.

## 2021-11-03 ENCOUNTER — Other Ambulatory Visit: Payer: Self-pay | Admitting: Urology

## 2021-11-06 NOTE — Progress Notes (Signed)
[place] Phone: [phone] Subjective:    I'm seeing this patient by the request  of:  [name], MD  CC: Worsening low back pain  XNT:[id number]  10/26/2021 Continues to have difficulty with the pelvic floor.  Has had a prostate cancer now and is being worked up for that.  Patient is doing physical therapy and I think will be helpful.  Increasing tightness in the hip flexor noted.  Toradol and Depo-Medrol given today that I think will be helpful.  Baclofen given for any type of breakthrough pain.  Do not feel that a course of prednisone is necessary at this time and hopefully will make significant improvement otherwise.  Follow-up with me again in 6 to 8 weeks  Updated 11/10/2021 Dmani Mizer is a 61 y.o. male coming in with complaint of right leg pain. Back pain used Aspercreme and that helped. Talk surgery and cortisone shot. Will recovery sandals help with back pain.  Patient unfortunately now states that he may be worsening over the course of time.  Patient is accompanied with wife who states that if she is concerned because she can see him doing less and less activity.       Past Medical History:  Diagnosis Date   Allergy    Anxiety    Depression    Gastroparesis    Heart murmur    Migraine headache    Mitral valve prolapse    SVT (supraventricular tachycardia) (HCC)    No past surgical history on file. Social History   Socioeconomic History   Marital status: Married    Spouse name: Not on file   Number of children: 0   Years of education: Not on file   Highest education level: Bachelor's degree (e.g., BA, AB, BS)  Occupational History   Not on file  Tobacco Use   Smoking status: Never   Smokeless tobacco: Never  Vaping Use   Vaping Use: Never used  Substance and Sexual Activity   Alcohol use: No    Alcohol/week: 0.0 standard drinks of alcohol   Drug use: No   Sexual  activity: Yes    Partners: Female  Other Topics Concern   Not on file  Social History Narrative   Pt is married lives with spouse in 1 story apartment he has No children   BS degree- he is right handed   Drinks soda  Ginger ale, no coffee no tea   Right handed   Social Determinants of Health   Financial Resource Strain: Low Risk  (10/25/2021)   Overall Financial Resource Strain (CARDIA)    Difficulty of Paying Living Expenses: Not hard at all  Food Insecurity: No Food Insecurity (10/25/2021)   Hunger Vital Sign    Worried About Running Out of Food in the Last Year: Never true    Ran Out of Food in the Last Year: Never true  Transportation Needs: No Transportation Needs (10/25/2021)   PRAPARE - Hydrologist (Medical): No    Lack of Transportation (Non-Medical): No  Physical Activity: Not on file  Stress: Stress Concern Present (10/25/2021)   [place]    Feeling of Stress : Rather much  Social Connections: Not on file   Allergies  Allergen Reactions   Erythromycin Anaphylaxis and Nausea And Vomiting   Nalbuphine Other (See Comments)    States loss of consciousness  Cefdinir Diarrhea, Nausea And Vomiting and Rash   Cephalexin Diarrhea and Nausea And Vomiting   Cephalosporins Diarrhea and Nausea And Vomiting   Hydrocodone-Acetaminophen Nausea And Vomiting   Ketorolac Nausea And Vomiting    Other reaction(s): upset stomach   Prochlorperazine Edisylate Other (See Comments)    uncontrollable shake - tremor    Restasis [Cyclosporine] Other (See Comments)    migraines   Tyloxapol Swelling   Bacitracin-Polymyxin B     Other reaction(s): rash gets worse Other reaction(s): rash gets worse   Codeine Other (See Comments)    Other reaction(s): stomach upset   Nalbuphine Hcl     Other reaction(s): stomach upset   Prochlorperazine     Other reaction(s): tremors/vomiting   Propranolol  Diarrhea   Topamax [Topiramate] Diarrhea and Nausea And Vomiting   Zetia [Ezetimibe] Diarrhea   Cefdinir Diarrhea, Nausea And Vomiting and Rash    Other reaction(s): too stronge   Doxycycline Hyclate Nausea Only and Other (See Comments)    abd pain   Iodinated Contrast Media Other (See Comments)    Lightheaded, flushed feeling (explained to patient these are normal side effects of IV iodinated contrast, not allergies, but he wanted this noted in the epic; he tolerated epidural steroid injections w/ contrast w/o difficulty)   Ketoconazole Itching   Ketorolac Tromethamine Rash    REACTION: Reaction not known   Latex Itching and Rash   Neosporin [Neomycin-Bacitracin Zn-Polymyx] Rash   Oxycodone-Acetaminophen Other (See Comments)    Tylox caused constipation   Prednisone Nausea And Vomiting    Oral route, only Other reaction(s): stomach spasms   Family History  Problem Relation Age of Onset   Hypertension Mother    Lung cancer Father 66 - 67   Hypertension Brother    Kidney failure Maternal Grandmother    Prostate cancer Neg Hx    Kidney cancer Neg Hx      Current Outpatient Medications (Cardiovascular):    diltiazem (CARDIZEM CD) 120 MG 24 hr capsule, Take 1 capsule (120 mg total) by mouth daily.   diltiazem (CARDIZEM) 30 MG tablet, TAKE (1) TABLET AS NEEDED FOR PALPITATIONS  Current Outpatient Medications (Respiratory):    fluticasone (FLONASE) 50 MCG/ACT nasal spray, Place 2 sprays into both nostrils daily.  Current Outpatient Medications (Analgesics):    EMGALITY 120 MG/ML SOAJ, as directed.   Rimegepant Sulfate (NURTEC) 75 MG TBDP, Take 75 mg by mouth as needed for up to 1 dose (Maximum 1 tablet in 24 hours.).   Current Outpatient Medications (Other):    baclofen (LIORESAL) 10 MG tablet, Take 0.5 tablets (5 mg total) by mouth at bedtime as needed for muscle spasms.   metoCLOPramide (REGLAN) 5 MG tablet, Take 5 mg by mouth as needed.   ondansetron (ZOFRAN-ODT) 8 MG  disintegrating tablet, Take 8 mg by mouth 3 (three) times daily as needed.   Reviewed prior external information including notes and imaging from  primary care provider As well as notes that were available from care everywhere and other healthcare systems.  Patient is scheduled for [facility] placement for his prostate cancer in September.  Past medical history, social, surgical and family history all reviewed in electronic medical record.  No pertanent information unless stated regarding to the chief complaint.   Review of Systems:  No headache, visual changes, nausea, vomiting, diarrhea, constipation, dizziness, abdominal pain, skin rash, fevers, chills, night sweats, weight loss, swollen lymph nodes,  joint swelling, chest pain, shortness of breath, mood changes. POSITIVE muscle aches,  body aches  Objective  Blood pressure 114/74, pulse 83, height '5\' 10"'$  (1.778 m), weight 142 lb (64.4 kg), SpO2 98 %.   General: No apparent distress alert and oriented x3 mood and affect normal, dressed appropriately.  HEENT: Pupils equal, extraocular movements intact  Respiratory: Patient's speak in full sentences and does not appear short of breath  Cardiovascular: No lower extremity edema, non tender, no erythema  Low back exam does have loss of lordosis.  Patient does appear to be minorly more cachectic.  Patient does have tenderness to palpation diffusely.  Positive straight leg test on the left side.  Pain seems to be out of proportion to the amount of palpation.  Neurovascularly intact distally otherwise.  Patient does have some midline tenderness noted of the L3-L4 area as well.    Impression and Recommendations:

## 2021-11-06 NOTE — Progress Notes (Signed)
Pt has current ekg 06-30-2021 in epic, will not need ekg with pre seed appointment

## 2021-11-08 ENCOUNTER — Inpatient Hospital Stay: Payer: PPO | Attending: Oncology | Admitting: Licensed Clinical Social Worker

## 2021-11-08 ENCOUNTER — Encounter: Payer: Self-pay | Admitting: Nutrition

## 2021-11-08 ENCOUNTER — Telehealth: Payer: Self-pay

## 2021-11-08 NOTE — Telephone Encounter (Signed)
CSW received a vm message on 11/07/21 requesting a change in appointment scheduled for 1pm on 11/08/21.  Contacted patient and appointment is rescheduled for Friday, 7/14, at 11:30am.

## 2021-11-08 NOTE — Progress Notes (Signed)
Received a message from patient that he wished to cancel his nutrition appointment. He did not request to reschedule.

## 2021-11-09 ENCOUNTER — Encounter: Payer: PPO | Admitting: Nutrition

## 2021-11-10 ENCOUNTER — Telehealth: Payer: Self-pay | Admitting: Genetic Counselor

## 2021-11-10 ENCOUNTER — Ambulatory Visit: Payer: PPO | Admitting: Family Medicine

## 2021-11-10 ENCOUNTER — Encounter: Payer: Self-pay | Admitting: Genetic Counselor

## 2021-11-10 VITALS — BP 114/74 | HR 83 | Ht 70.0 in | Wt 142.0 lb

## 2021-11-10 DIAGNOSIS — M545 Low back pain, unspecified: Secondary | ICD-10-CM

## 2021-11-10 DIAGNOSIS — M5416 Radiculopathy, lumbar region: Secondary | ICD-10-CM | POA: Diagnosis not present

## 2021-11-10 DIAGNOSIS — Z1379 Encounter for other screening for genetic and chromosomal anomalies: Secondary | ICD-10-CM | POA: Insufficient documentation

## 2021-11-10 NOTE — Telephone Encounter (Signed)
I contacted William [name] to discuss his genetic testing results. No pathogenic variants were identified in the 36 genes analyzed. Of note, a variant of uncertain significance was identified in the BRIP1 gene. Detailed clinic note to follow.  The test report has been scanned into EPIC and is located under the Molecular Pathology section of the Results Review tab.  A portion of the result report is included below for reference.   Lucille Passy, MS, [facility] Genetic Counselor [name] (P) [phone]

## 2021-11-10 NOTE — Assessment & Plan Note (Signed)
Concerned the patient is having more of a lumbar radiculopathy again.  Patient is accompanied with wife who states that he is having more difficulty recently.  Discussed with patient about icing regimen and home exercises, discussed which activities to do and which ones to avoid.  Increase activity unfortunately has not been working.  Due to patient's also history of prostate cancer I do feel that advanced imaging is warranted at this time.  Patient is going to get the MRI and depending on findings we will discuss different treatment options.

## 2021-11-10 NOTE — Telephone Encounter (Signed)
I attempted to contact William [name] to discuss his genetic testing results (36 genes). I left a voicemail requesting he call me back at [phone].  Lucille Passy, MS, [facility] Genetic Counselor [email or web address] (P) [phone]

## 2021-11-10 NOTE — Patient Instructions (Signed)
[place] [phone] Call Today  When we receive your results we will contact you.  Let's see what is shows and consider epidural Think Pelvic PT is still as good idea

## 2021-11-13 ENCOUNTER — Ambulatory Visit
Admission: RE | Admit: 2021-11-13 | Discharge: 2021-11-13 | Disposition: A | Discharge status: Home / Self Care | Payer: PPO | Source: Ambulatory Visit | Attending: Family Medicine | Admitting: Family Medicine

## 2021-11-13 ENCOUNTER — Ambulatory Visit: Payer: Self-pay | Admitting: Genetic Counselor

## 2021-11-13 DIAGNOSIS — Z1379 Encounter for other screening for genetic and chromosomal anomalies: Secondary | ICD-10-CM

## 2021-11-13 DIAGNOSIS — M545 Low back pain, unspecified: Secondary | ICD-10-CM

## 2021-11-13 NOTE — Progress Notes (Addendum)
HPI:   William [name] was previously seen in the  Cancer Genetics clinic due to a personal and family history of cancer and concerns regarding a hereditary predisposition to cancer. Please refer to our prior cancer genetics clinic note for more information regarding our discussion, assessment and recommendations, at the time. William [name] recent genetic test results were disclosed to him, as were recommendations warranted by these results. These results and recommendations are discussed in more detail below.  CANCER HISTORY:  Oncology History  Malignant neoplasm of prostate (HCC)  10/24/2021 Initial Diagnosis   Malignant neoplasm of prostate (HCC)   10/24/2021 Cancer Staging   Staging form: Prostate, AJCC 8th Edition - Clinical: Stage IIC (cT1c, cN0, cM0, PSA: 5.3, Grade Group: 3) - Signed by Benjiman Core, MD on 10/24/2021 Prostate specific antigen (PSA) range: Less than 10 Gleason score: 7 Histologic grading system: 5 grade system     FAMILY HISTORY:  We obtained a detailed, 4-generation family history.  Significant diagnoses are listed below:      Family History  Problem Relation Age of Onset   Hypertension Mother     Lung cancer Father 36 - 43   Hypertension Brother     Kidney failure Maternal Grandmother     Prostate cancer Neg Hx     Kidney cancer Neg Hx         William [name]'s father was diagnosed with lung cancer in his early 41s and died at age 80, he smoked. Of note, he has limited information about his paternal family history and his father was an only child. William [name] is unaware of previous family history of genetic testing for hereditary cancer risks.   GENETIC TEST RESULTS:  The [facility] CancerNext Panel found no pathogenic mutations.   The CancerNext gene panel offered by [employer] includes sequencing, rearrangement analysis, and RNA analysis for the following 36 genes:   APC, ATM, AXIN2, BARD1, BMPR1A, BRCA1, BRCA2, BRIP1, CDH1, CDK4, CDKN2A, CHEK2, DICER1,  HOXB13, EPCAM, GREM1, MLH1, MSH2, MSH3, MSH6, MUTYH, NBN, NF1, NTHL1, PALB2, PMS2, POLD1, POLE, PTEN, RAD51C, RAD51D, RECQL, SMAD4, SMARCA4, STK11, and TP53.   The test report has been scanned into EPIC and is located under the Molecular Pathology section of the Results Review tab.  A portion of the result report is included below for reference. Genetic testing reported out on 11/09/2021.       Genetic testing identified a variant of uncertain significance (VUS) in the BRIP1 gene called [id number].E1027K.  At this time, it is unknown if this variant is associated with an increased risk for cancer or if it is benign, but most uncertain variants are reclassified to benign. It should not be used to make medical management decisions. With time, we suspect the laboratory will determine the significance of this variant, if any. If the laboratory reclassifies this variant, we will attempt to contact William [name] to discuss it further.   Update: The VUS identified in the BRIP1 gene ([id number]) was reclassified to likely benign. Report date is 12/25/2022.   Even though a pathogenic variant was not identified, possible explanations for the cancer in the family may include: There may be no hereditary risk for cancer in the family. The cancers in William [name] and/or his family may be due to other genetic or environmental factors. There may be a gene mutation in one of these genes that current testing methods cannot detect, but that chance is small. There could be another gene that has not yet been  discovered, or that we have not yet tested, that is responsible for the cancer diagnoses in the family.   Therefore, it is important to remain in touch with cancer genetics in the future so that we can continue to offer William [name] the most up to date genetic testing.   ADDITIONAL GENETIC TESTING:  We discussed with William [name] that his genetic testing was fairly extensive.  If there are genes identified to increase cancer risk that  can be analyzed in the future, we would be happy to discuss and coordinate this testing at that time.    CANCER SCREENING RECOMMENDATIONS:  William [name] test result is considered negative (normal). This means that we have not identified a hereditary cause for his personal and family history of cancer at this time. Most cancers happen by chance and this negative test suggests that his cancer may fall into this category.    An individual's cancer risk and medical management are not determined by genetic test results alone. Overall cancer risk assessment incorporates additional factors, including personal medical history, family history, and any available genetic information that may result in a personalized plan for cancer prevention and surveillance. Therefore, it is recommended he continue to follow the cancer management and screening guidelines provided by his oncology and primary healthcare provider.  FOLLOW-UP:  Cancer genetics is a rapidly advancing field and it is possible that new genetic tests will be appropriate for him and/or his family members in the future. We encouraged him to remain in contact with cancer genetics on an annual basis so we can update his personal and family histories and let him know of advances in cancer genetics that may benefit this family.   Our contact number was provided. William [name] questions were answered to his satisfaction, and he knows he is welcome to call us at anytime with additional questions or concerns.   Lalla Brothers, MS, [facility] Genetic Counselor [email or web address] (P) [phone]

## 2021-11-16 ENCOUNTER — Encounter: Payer: Self-pay | Admitting: Genetic Counselor

## 2021-11-17 ENCOUNTER — Inpatient Hospital Stay: Payer: PPO

## 2021-11-17 ENCOUNTER — Telehealth: Payer: Self-pay | Admitting: *Deleted

## 2021-11-17 NOTE — Telephone Encounter (Signed)
Returned patient's phone call, spoke with patient 

## 2021-11-17 NOTE — Progress Notes (Signed)
[place] CSW Progress Note  Clinical Education officer, museum contacted patient by phone to provide adjustment counseling.  Patient expressed feeling stress regarding his choice in treatment.  Discussed anxiety and coping methods he is currently using.  William [name] stated he enjoys reading and will drive to various locations to read.  Explored deep breathing and intentionally being in the present moment.  He stated attending the support group has helped.  He expressed feeling several different emotions, which CSW normalized.  The next phone session is scheduled for Friday, 7/28, at [place], LCSW

## 2021-11-21 ENCOUNTER — Telehealth: Payer: Self-pay | Admitting: *Deleted

## 2021-11-21 NOTE — Telephone Encounter (Signed)
Called patient to remind of pre-seed appts. for 11-23-21, spoke with patient and he is aware of these appts.

## 2021-11-22 ENCOUNTER — Telehealth: Payer: Self-pay

## 2021-11-22 ENCOUNTER — Encounter: Payer: Self-pay | Admitting: Urology

## 2021-11-22 NOTE — Telephone Encounter (Addendum)
Under the direction of Ashlyn Bruning PA-C... Patient has been notified of the message below and provided verbal understanding of the information. I left my extension [phone] in case patient needs anything.   Please let the patient know that the prior MRI scan does not overlap or duplicate the scheduled CT SIM for tomorrow. The scan he will have tomorrow is a modified pelvic CT used for planning purposes so that we can accurately measure the prostate and determine the number of seeds needed for the procedure and also assess his pelvic outlet at the pubic symphysis to ensure we can safely get back to the prostate to adequately cover the prostate with the seed implants. -Ashlyn

## 2021-11-23 ENCOUNTER — Ambulatory Visit: Payer: PPO | Admitting: Radiation Oncology

## 2021-11-23 ENCOUNTER — Ambulatory Visit: Payer: Self-pay | Admitting: Urology

## 2021-11-23 ENCOUNTER — Other Ambulatory Visit: Payer: Self-pay

## 2021-11-23 ENCOUNTER — Encounter: Payer: Self-pay | Admitting: Cardiovascular Disease

## 2021-11-23 ENCOUNTER — Ambulatory Visit
Admission: RE | Admit: 2021-11-23 | Discharge: 2021-11-23 | Disposition: A | Discharge status: Home / Self Care | Payer: PPO | Source: Ambulatory Visit | Attending: Urology | Admitting: Urology

## 2021-11-23 ENCOUNTER — Encounter: Payer: Self-pay | Admitting: Urology

## 2021-11-23 ENCOUNTER — Encounter: Payer: Self-pay | Admitting: Family Medicine

## 2021-11-23 ENCOUNTER — Ambulatory Visit
Admission: RE | Admit: 2021-11-23 | Discharge: 2021-11-23 | Disposition: A | Discharge status: Home / Self Care | Payer: PPO | Source: Ambulatory Visit | Attending: Radiation Oncology | Admitting: Radiation Oncology

## 2021-11-23 VITALS — Resp 18 | Ht 70.0 in | Wt 139.0 lb

## 2021-11-23 DIAGNOSIS — C61 Malignant neoplasm of prostate: Secondary | ICD-10-CM

## 2021-11-23 NOTE — Progress Notes (Signed)
Radiation Oncology         [phone] ________________________________  Outpatient Follow up- Pre-seed visit  Name: William [name] MRN: [id number]  Date: 11/23/2021  DOB: 1960/06/17  CC:[name], MD  [name], MD   REFERRING PHYSICIAN: [name], MD  DIAGNOSIS: 61 y.o. gentleman with stage T1c adenocarcinoma of the prostate with a Gleason's score of 4+3 and a PSA of 5.3.    ICD-10-CM   1. Malignant neoplasm of prostate ([place])  C61       HISTORY OF PRESENT ILLNESS: William [name] is a 61 y.o. male with a diagnosis of prostate cancer. He has a history of chronic left testicular, left flank, pelvic, and scrotal pains. He has been seen by a number of different urology providers, but no etiology was found to explain his pain. More recently, he was noted to have an elevated PSA of 4.62 by his primary care physician.  Accordingly, he was referred for evaluation in urology by Dr. Erlene Quan on 08/09/21,  digital rectal examination was performed at that time revealing no nodules. Repeat PSA from that day showed further elevation to 5.3. The patient proceeded to transrectal ultrasound with 12 biopsies of the prostate on 09/05/21.  The prostate volume measured 52.8 cc.  Out of 12 core biopsies, 3 were positive.  The maximum Gleason score was 4+3, and this was seen in right lateral mid. Additionally, Gleason 3+4 was seen in right lateral base, and Gleason 3+3 in right mid.   He underwent PSMA PET scan on 09/26/21 showing tracer-avid right-sided prostate primary without evidence of tracer-avid nodal metastasis. There were subtle areas of low-level tracer affinity in the bilateral ribs, favored to be physiologic, with no CT correlate.   The patient reviewed the biopsy results with his urologist and was kindly referred to [place] for discussion of potential radiation treatment options. We initially met the patient in the multidisciplinary prostate clinic on 10/24/21 and he ultimately elected to  proceed with brachytherapy and SpaceOAR gel placement for treatment of his disease. He is here today for his pre-procedure imaging for planning and to answer any additional questions he may have about this treatment.   PREVIOUS RADIATION THERAPY: No  PAST MEDICAL HISTORY:  Past Medical History:  Diagnosis Date   Allergy    Anxiety    Depression    Gastroparesis    Heart murmur    Migraine headache    Mitral valve prolapse    SVT (supraventricular tachycardia) (HCC)       PAST SURGICAL HISTORY:No past surgical history on file.  FAMILY HISTORY:  Family History  Problem Relation Age of Onset   Hypertension Mother    Lung cancer Father 1 - 63   Hypertension Brother    Kidney failure Maternal Grandmother    Prostate cancer Neg Hx    Kidney cancer Neg Hx     SOCIAL HISTORY:  Social History   Socioeconomic History   Marital status: Married    Spouse name: Not on file   Number of children: 0   Years of education: Not on file   Highest education level: Bachelor's degree (e.g., BA, AB, BS)  Occupational History   Not on file  Tobacco Use   Smoking status: Never   Smokeless tobacco: Never  Vaping Use   Vaping Use: Never used  Substance and Sexual Activity   Alcohol use: No    Alcohol/week: 0.0 standard drinks of alcohol   Drug use: No   Sexual activity: Yes  Partners: Female  Other Topics Concern   Not on file  Social History Narrative   Pt is married lives with spouse in 1 story apartment he has No children   BS degree- he is right handed   Drinks soda  Ginger ale, no coffee no tea   Right handed   Social Determinants of Health   Financial Resource Strain: Low Risk  (10/25/2021)   Overall Financial Resource Strain (CARDIA)    Difficulty of Paying Living Expenses: Not hard at all  Food Insecurity: No Food Insecurity (10/25/2021)   Hunger Vital Sign    Worried About Running Out of Food in the Last Year: Never true    Ran Out of Food in the Last Year: Never  true  Transportation Needs: No Transportation Needs (10/25/2021)   PRAPARE - Hydrologist (Medical): No    Lack of Transportation (Non-Medical): No  Physical Activity: Not on file  Stress: Stress Concern Present (10/25/2021)   [place]    Feeling of Stress : Rather much  Social Connections: Not on file  Intimate Partner Violence: Not on file    ALLERGIES: Erythromycin, Nalbuphine, Cefdinir, Cephalexin, Cephalosporins, Hydrocodone-acetaminophen, Ketorolac, Prochlorperazine edisylate, Restasis [cyclosporine], Tyloxapol, Bacitracin-polymyxin b, Codeine, Nalbuphine hcl, Prochlorperazine, Propranolol, Topamax [topiramate], Zetia [ezetimibe], Cefdinir, Doxycycline hyclate, Iodinated contrast media, Ketoconazole, Ketorolac tromethamine, Latex, Neosporin [neomycin-bacitracin zn-polymyx], Oxycodone-acetaminophen, and Prednisone  MEDICATIONS:  Current Outpatient Medications  Medication Sig Dispense Refill   baclofen (LIORESAL) 10 MG tablet Take 0.5 tablets (5 mg total) by mouth at bedtime as needed for muscle spasms. 30 each 0   diltiazem (CARDIZEM CD) 120 MG 24 hr capsule Take 1 capsule (120 mg total) by mouth daily. 90 capsule 3   diltiazem (CARDIZEM) 30 MG tablet TAKE (1) TABLET AS NEEDED FOR PALPITATIONS 30 tablet 11   EMGALITY 120 MG/ML SOAJ as directed.     fluticasone (FLONASE) 50 MCG/ACT nasal spray Place 2 sprays into both nostrils daily. 16 g 6   metoCLOPramide (REGLAN) 5 MG tablet Take 5 mg by mouth as needed.     ondansetron (ZOFRAN-ODT) 8 MG disintegrating tablet Take 8 mg by mouth 3 (three) times daily as needed.     Rimegepant Sulfate (NURTEC) 75 MG TBDP Take 75 mg by mouth as needed for up to 1 dose (Maximum 1 tablet in 24 hours.). 8 tablet 5   No current facility-administered medications for this visit.    REVIEW OF SYSTEMS:  On review of systems, the patient reports that he is doing  well overall. He denies any chest pain, shortness of breath, cough, fevers, chills, night sweats, unintended weight changes. He denies any bowel disturbances, and denies abdominal pain, nausea or vomiting. He denies any new musculoskeletal or joint aches or pains. His IPSS and SHIM werer recorded.    PHYSICAL EXAM:  Wt Readings from Last 3 Encounters:  11/10/21 142 lb (64.4 kg)  10/26/21 138 lb (62.6 kg)  10/25/21 144 lb 3.2 oz (65.4 kg)   Temp Readings from Last 3 Encounters:  10/24/21 98.2 F (36.8 C) (Oral)  09/21/21 98.4 F (36.9 C)  06/24/18 98 F (36.7 C) (Oral)   BP Readings from Last 3 Encounters:  11/10/21 114/74  10/26/21 106/72  10/25/21 124/70   Pulse Readings from Last 3 Encounters:  11/10/21 83  10/26/21 98  10/25/21 79    /10  In general this is a well appearing African American male in no acute  distress. He's alert and oriented x4 and appropriate throughout the examination. Cardiopulmonary assessment is negative for acute distress, and he exhibits normal effort.     KPS = 100  100 - Normal; no complaints; no evidence of disease. 90   - Able to carry on normal activity; minor signs or symptoms of disease. 80   - Normal activity with effort; some signs or symptoms of disease. 66   - Cares for self; unable to carry on normal activity or to do active work. 60   - Requires occasional assistance, but is able to care for most of his personal needs. 50   - Requires considerable assistance and frequent medical care. 76   - Disabled; requires special care and assistance. 68   - Severely disabled; hospital admission is indicated although death not imminent. 58   - Very sick; hospital admission necessary; active supportive treatment necessary. 10   - Moribund; fatal processes progressing rapidly. 0     - Dead  Karnofsky DA, Abelmann [place], Craver LS and Burchenal [facility] [phone]) The use of the nitrogen mustards in the palliative treatment of carcinoma: with particular  reference to bronchogenic carcinoma Cancer 1 634-56  LABORATORY DATA:  Lab Results  Component Value Date   WBC 7.3 10/18/2020   HGB 15.4 10/18/2020   HCT 46.3 10/18/2020   MCV 96.8 10/18/2020   PLT 234.0 10/18/2020   Lab Results  Component Value Date   NA 138 10/18/2020   K 4.1 10/18/2020   CL 103 10/18/2020   CO2 27 10/18/2020   Lab Results  Component Value Date   ALT 14 10/18/2020   AST 17 10/18/2020   ALKPHOS 64 10/18/2020   BILITOT 1.3 (H) 10/18/2020     RADIOGRAPHY: MR Lumbar Spine Wo Contrast  Result Date: 11/14/2021 CLINICAL DATA:  Low back pain progressively worsening radiating down posterior left leg. EXAM: MRI LUMBAR SPINE WITHOUT CONTRAST TECHNIQUE: Multiplanar, multisequence MR imaging of the lumbar spine was performed. No intravenous contrast was administered. COMPARISON:  Lumbar spine radiographs 10/26/2021, lumbar spine MRI 11/15/2017 FINDINGS: Segmentation: Standard; the lowest formed disc space is designated L5-S1. Alignment: There is grade 1 retrolisthesis of L5 on S1, unchanged. Alignment is otherwise normal. Vertebrae: Vertebral body heights are preserved. Background marrow signal is normal. A sclerotic appearing lesion in the left aspect of the L1 vertebral body is unchanged. There are no suspicious lesions. There is no marrow edema. Conus medullaris and cauda equina: Conus extends to the L1 level. Conus and cauda equina appear normal. Paraspinal and other soft tissues: A small T2 hyperintense lesion in the right kidney upper pole likely reflects a benign cyst for which no specific imaging follow-up is required. Disc levels: T12-L1: No significant spinal canal or neural foraminal stenosis L1-L2: No significant spinal canal or neural foraminal stenosis L2-L3: No significant spinal canal or neural foraminal stenosis L3-L4: There is minimal disc degeneration and facet arthropathy without significant spinal canal or neural foraminal stenosis, not significantly changed.  L4-L5: There is mild disc desiccation and narrowing with a mild bulge with a left foraminal component and mild bilateral facet arthropathy resulting in moderate left worse than right neural foraminal stenosis without significant spinal canal stenosis, not significantly changed. L5-S1: There is grade 1 retrolisthesis with disc desiccation and narrowing, a mild broad-based central protrusion with a small annular fissure, and mild facet arthropathy resulting in mild left worse than right subarticular zone narrowing without evidence of frank nerve root impingement and moderate left worse than right  neural foraminal stenosis, not significantly changed. IMPRESSION: Degenerative changes at L4-L5 and L5-S1 resulting in moderate left worse than right neural foraminal stenosis at both levels and mild left worse than right subarticular zone narrowing at L5-S1 without frank impingement. Findings are not significantly changed since 2019. Electronically Signed   By: Valetta Mole M.D.   On: 11/14/2021 09:14   [place] LIMITED JOINT SPACE STRUCTURES LOW RIGHT(NO LINKED CHARGES)  Result Date: 11/12/2021 No images saved   DG Lumbar Spine 2-3 Views  Result Date: 10/27/2021 CLINICAL DATA:  Back pain EXAM: LUMBAR SPINE - 2-3 VIEW COMPARISON:  09/18/2021 FINDINGS: No recent fracture is seen. There is disc space narrowing at L4-L5 and L5-S1 levels. Bony spurs and facet hypertrophy is seen in the lower lumbar spine. No significant interval changes are noted. IMPRESSION: No recent fracture is seen. Degenerative changes are noted in the lumbar spine more prominent at L4-L5 and L5-S1 levels with no significant interval change. Electronically Signed   By: Elmer Picker M.D.   On: 10/27/2021 14:51   DG Pelvis 1-2 Views  Result Date: 10/27/2021 CLINICAL DATA:  Pain EXAM: PELVIS - 1-2 VIEW COMPARISON:  09/19/2018 FINDINGS: No fracture is seen. There are no focal lytic or sclerotic lesions. Minimal bony spurs seen in both hips.  IMPRESSION: No significant radiographic abnormality is seen in the pelvis. Electronically Signed   By: Elmer Picker M.D.   On: 10/27/2021 14:48   DG Thoracic Spine 2 View  Result Date: 10/27/2021 CLINICAL DATA:  Back pain EXAM: THORACIC SPINE 2 VIEWS COMPARISON:  None Available. FINDINGS: No fracture is seen. Alignment of posterior margins of vertebral bodies is unremarkable. Paraspinal soft tissues are unremarkable. IMPRESSION: No radiographic abnormality is seen in the thoracic spine. Electronically Signed   By: Elmer Picker M.D.   On: 10/27/2021 14:47      IMPRESSION/PLAN: 1. 61 y.o. gentleman with stage T1c adenocarcinoma of the prostate with a Gleason's score of 4+3 and a PSA of 5.3. The patient has elected to proceed with seed implant for treatment of his disease. We reviewed the risks, benefits, short and long-term effects associated with brachytherapy and discussed the role of SpaceOAR in reducing the rectal toxicity associated with radiotherapy.  He appears to have a good understanding of his disease and our treatment recommendations which are of curative intent.  He was encouraged to ask questions that were answered to his stated satisfaction. He has freely signed written consent to proceed today in the office and a copy of this document will be placed in his medical record. His procedure is tentatively scheduled for 01/19/22 in collaboration with Dr. Jeffie Pollock and we will see him back for his post-procedure visit approximately 3 weeks thereafter. We look forward to continuing to participate in his care. He knows that he is welcome to call with any questions or concerns at any time in the interim.  I personally spent 45 minutes in this encounter including chart review, reviewing radiological studies, meeting face-to-face with the patient, entering orders and completing documentation.    Nicholos Johns, MMS, PA-C [facility]: [phone]  Fax: [phone]

## 2021-11-23 NOTE — Progress Notes (Signed)
Pre-seed appointment. I verified patient's identity and began nursing interview. Patient reports pelvic pain/pressure 3/10. No other issues reported at this time.  Meaningful use complete No urinary management medications. Urology appt- Aug 9th, 2023.  Resp 18   Ht '5\' 10"'$  (1.778 m)   Wt 139 lb (63 kg)   BMI 19.94 kg/m

## 2021-11-24 ENCOUNTER — Inpatient Hospital Stay: Payer: PPO

## 2021-11-26 NOTE — Progress Notes (Signed)
  Radiation Oncology         [phone] ________________________________  Name: Dimitrios Balestrieri MRN: [id number]  Date: 11/23/2021  DOB: [birth year]  SIMULATION AND TREATMENT PLANNING NOTE PUBIC ARCH STUDY  RC:BULAGT, Marjory Lies, MD  [name], MD  DIAGNOSIS:   61 y.o. gentleman with stage T1c adenocarcinoma of the prostate with a Gleason's score of 4+3 and a PSA of 5.3.  Oncology History  Malignant neoplasm of prostate ([place])  10/24/2021 Initial Diagnosis   Malignant neoplasm of prostate ([place])   10/24/2021 Cancer Staging   Staging form: Prostate, AJCC 8th Edition - Clinical: Stage IIC (cT1c, cN0, cM0, PSA: 5.3, Grade Group: 3) - Signed by Wyatt Portela, MD on 10/24/2021 Prostate specific antigen (PSA) range: Less than 10 Gleason score: 7 Histologic grading system: 5 grade system       ICD-10-CM   1. Malignant neoplasm of prostate ([place])  C61       COMPLEX SIMULATION:  The patient presented today for evaluation for possible prostate seed implant. He was brought to the radiation planning suite and placed supine on the CT couch. A 3-dimensional image study set was obtained in upload to the planning computer. There, on each axial slice, I contoured the prostate gland. Then, using three-dimensional radiation planning tools I reconstructed the prostate in view of the structures from the transperineal needle pathway to assess for possible pubic arch interference. In doing so, I did not appreciate any pubic arch interference. Also, the patient's prostate volume was estimated based on the drawn structure. The volume was 38 cc, this is different from his TRUS volume of 52 cc.  Given the pubic arch appearance and prostate volume, patient remains a good candidate to proceed with prostate seed implant. Today, he freely provided informed written consent to proceed.    PLAN: The patient will undergo prostate seed implant.   ________________________________  Sheral Apley. Tammi Klippel,  M.D.

## 2021-11-28 ENCOUNTER — Ambulatory Visit: Payer: PPO | Admitting: Family Medicine

## 2021-12-07 ENCOUNTER — Ambulatory Visit: Payer: PPO | Admitting: Family Medicine

## 2021-12-11 DIAGNOSIS — C61 Malignant neoplasm of prostate: Secondary | ICD-10-CM

## 2021-12-18 ENCOUNTER — Inpatient Hospital Stay: Payer: PPO | Attending: Oncology | Admitting: Nutrition

## 2021-12-18 NOTE — Progress Notes (Signed)
Telephone consult scheduled with patient.  Patient is a 61 year old male diagnosed with prostate cancer and followed by Dr. Tammi Klippel.  Plan is for radioactive seed implant. Patient is s/p colonoscopy and was told he had Diverticulosis.  Past medical history includes anxiety, depression, gastroparesis.  Medications include Reglan and Zofran.  Labs were reviewed.  Height: 5 feet 10 inches. Weight: 139 pounds. Usual body weight: 146 pounds September 28, 2021. BMI: 19.94.  Patient requested information on healthy diet choices.  Nutrition diagnosis: Food and nutrition related knowledge deficit related to prostate cancer and associated treatments as evidenced by no prior need for nutrition related information.  Intervention: Patient reports there is not enough time today to discuss his questions. He would like to reschedule.  Monitoring, evaluation, goals: Patient has RD contact information. He will reschedule.

## 2021-12-19 NOTE — Progress Notes (Unsigned)
[place] Phone: [phone] Subjective:   William [name], am serving as a scribe for Dr. Hulan [name].   I'm seeing this patient by the request  of:  William Pepper, MD  CC: low back f/u  [id number]  11/10/2021 Concerned the patient is having more of a lumbar radiculopathy again.  Patient is accompanied with wife who states that he is having more difficulty recently.  Discussed with patient about icing regimen and home exercises, discussed which activities to do and which ones to avoid.  Increase activity unfortunately has not been working.  Due to patient's also history of prostate cancer I do feel that advanced imaging is warranted at this time.  Patient is going to get the MRI and depending on findings we will discuss different treatment options.  Update 12/20/2021 William [name] is a 61 y.o. male coming in with complaint of lumbar spine pain. Patient states that he is doing ok. Patient would like to hold on the epidural. Patient is not trying to do anything that aggravates his back.  Patient at this moment does not know if he wants to go through with the epidural before or after the radial seed implantations.  Patient states that the back seems to be stable at the moment.  MRI showed  Degenerative changes at L4-L5 and L5-S1 resulting in moderate left worse than right neural foraminal stenosis at both levels and mild left worse than right subarticular zone narrowing at L5-S1 without frank impingement. Findings are not significantly changed since 2019.    Past Medical History:  Diagnosis Date   Allergy    Anxiety    Depression    Gastroparesis    Heart murmur    Migraine headache    Mitral valve prolapse    SVT (supraventricular tachycardia) (HCC)    [name] past surgical history on file. Social History   Socioeconomic History   Marital status: Married    Spouse name: Not on file   Number of  children: 0   Years of education: Not on file   Highest education level: Bachelor's degree (e.g., BA, AB, BS)  Occupational History   Not on file  Tobacco Use   Smoking status: Never   Smokeless tobacco: Never  Vaping Use   Vaping Use: Never used  Substance and Sexual Activity   Alcohol use: [name]    Alcohol/week: 0.0 standard drinks of alcohol   Drug use: [name]   Sexual activity: Yes    Partners: Female  Other Topics Concern   Not on file  Social History Narrative   Pt is married lives with spouse in 1 story apartment he has [name] children   BS degree- he is right handed   Drinks soda  Ginger ale, [name] coffee [name] tea   Right handed   Social Determinants of Health   Financial Resource Strain: Low Risk  (10/25/2021)   Overall Financial Resource Strain (CARDIA)    Difficulty of Paying Living Expenses: Not hard at all  Food Insecurity: [name] Food Insecurity (10/25/2021)   Hunger Vital Sign    Worried About Running Out of Food in the Last Year: Never true    Ran Out of Food in the Last Year: Never true  Transportation Needs: [name] Transportation Needs (10/25/2021)   PRAPARE - Hydrologist (Medical): [name]    Lack of Transportation (Non-Medical): [name]  Physical Activity: Not on file  Stress: Stress  Concern Present (10/25/2021)   [place]    Feeling of Stress : Rather much  Social Connections: Not on file   Allergies  Allergen Reactions   Erythromycin Anaphylaxis and Nausea And Vomiting   Nalbuphine Other (See Comments)    States loss of consciousness    Cefdinir Diarrhea, Nausea And Vomiting and Rash   Cephalexin Diarrhea and Nausea And Vomiting   Cephalosporins Diarrhea and Nausea And Vomiting   Hydrocodone-Acetaminophen Nausea And Vomiting   Ketorolac Nausea And Vomiting    Other reaction(s): upset stomach   Prochlorperazine Edisylate Other (See Comments)    uncontrollable shake - tremor     Restasis [Cyclosporine] Other (See Comments)    migraines   Tyloxapol Swelling   Bacitracin-Polymyxin B     Other reaction(s): rash gets worse Other reaction(s): rash gets worse   Codeine Other (See Comments)    Other reaction(s): stomach upset   Nalbuphine Hcl     Other reaction(s): stomach upset   Prochlorperazine     Other reaction(s): tremors/vomiting   Propranolol Diarrhea   Topamax [Topiramate] Diarrhea and Nausea And Vomiting   Zetia [Ezetimibe] Diarrhea   Cefdinir Diarrhea, Nausea And Vomiting and Rash    Other reaction(s): too stronge   Doxycycline Hyclate Nausea Only and Other (See Comments)    abd pain   Iodinated Contrast Media Other (See Comments)    Lightheaded, flushed feeling (explained to patient these are normal side effects of IV iodinated contrast, not allergies, but he wanted this noted in the epic; he tolerated epidural steroid injections w/ contrast w/o difficulty)   Ketoconazole Itching   Ketorolac Tromethamine Rash    REACTION: Reaction not known   Latex Itching and Rash   Neosporin [Neomycin-Bacitracin Zn-Polymyx] Rash   Oxycodone-Acetaminophen Other (See Comments)    Tylox caused constipation   Prednisone Nausea And Vomiting    Oral route, only Other reaction(s): stomach spasms   Family History  Problem Relation Age of Onset   Hypertension Mother    Lung cancer Father 73 - 45   Hypertension Brother    Kidney failure Maternal Grandmother    Prostate cancer Neg Hx    Kidney cancer Neg Hx      Current Outpatient Medications (Cardiovascular):    diltiazem (CARDIZEM CD) 120 MG 24 hr capsule, Take 1 capsule (120 mg total) by mouth daily.   diltiazem (CARDIZEM) 30 MG tablet, TAKE (1) TABLET AS NEEDED FOR PALPITATIONS  Current Outpatient Medications (Respiratory):    fluticasone (FLONASE) 50 MCG/ACT nasal spray, Place 2 sprays into both nostrils daily.  Current Outpatient Medications (Analgesics):    EMGALITY 120 MG/ML SOAJ, as directed.    Rimegepant Sulfate (NURTEC) 75 MG TBDP, Take 75 mg by mouth as needed for up to 1 dose (Maximum 1 tablet in 24 hours.).   Current Outpatient Medications (Other):    baclofen (LIORESAL) 10 MG tablet, Take 0.5 tablets (5 mg total) by mouth at bedtime as needed for muscle spasms.   metoCLOPramide (REGLAN) 5 MG tablet, Take 5 mg by mouth as needed.   ondansetron (ZOFRAN-ODT) 8 MG disintegrating tablet, Take 8 mg by mouth 3 (three) times daily as needed.   Reviewed prior external information including notes and imaging from  primary care provider As well as notes that were available from care everywhere and other healthcare systems.  Scheduled for radio seed implantation on September 22  Past medical history, social, surgical and family history all reviewed in  electronic medical record.  [name] pertanent information unless stated regarding to the chief complaint.   Review of Systems:  [name] headache, visual changes, nausea, vomiting, diarrhea, constipation, dizziness, abdominal pain, skin rash, fevers, chills, night sweats, weight loss, swollen lymph nodes,  joint swelling, chest pain, shortness of breath, mood changes. POSITIVE muscle aches, body aches  Objective  Blood pressure 104/72, pulse 79, height '5\' 10"'$  (1.778 m), weight 139 lb (63 kg), SpO2 98 %.   General: [name] apparent distress alert and oriented x3 mood and affect normal, dressed appropriately.  HEENT: Pupils equal, extraocular movements intact  Respiratory: Patient's speak in full sentences and does not appear short of breath  Cardiovascular: [name] lower extremity edema, non tender, [name] erythema  Low back exam shows patient does have some mild loss of lordosis but is sitting comfortably in the chair.    Impression and Recommendations:    The above documentation has been reviewed and is accurate and complete William Pulley, DO

## 2021-12-20 ENCOUNTER — Ambulatory Visit: Payer: PPO | Admitting: Family Medicine

## 2021-12-20 ENCOUNTER — Encounter: Payer: Self-pay | Admitting: Family Medicine

## 2021-12-20 DIAGNOSIS — M5416 Radiculopathy, lumbar region: Secondary | ICD-10-CM

## 2021-12-20 NOTE — Patient Instructions (Signed)
Good to see you I think you are in great hands Do epi or check in 4-6 weeks after your surgery

## 2021-12-20 NOTE — Assessment & Plan Note (Addendum)
Discussed with patient at great length.  We discussed about the possibility of an epidural before patient's surgical procedure.  Patient declined.  At this moment.  Discussed with patient about icing regimen and home exercises.  We will get the epidural likely 4 to 6 weeks after his surgical intervention.  Patient will follow-up with me again 6 weeks least 2 to 6 weeks after the injection. Total time with patient 32 minutes

## 2022-01-03 ENCOUNTER — Ambulatory Visit: Payer: PPO | Admitting: Family Medicine

## 2022-01-03 VITALS — BP 106/72 | HR 84 | Ht 70.0 in | Wt 138.0 lb

## 2022-01-03 DIAGNOSIS — M545 Low back pain, unspecified: Secondary | ICD-10-CM

## 2022-01-03 DIAGNOSIS — M5416 Radiculopathy, lumbar region: Secondary | ICD-10-CM | POA: Diagnosis not present

## 2022-01-03 MED ORDER — METHYLPREDNISOLONE ACETATE 40 MG/ML IJ SUSP
40.0000 mg | Freq: Once | INTRAMUSCULAR | Status: AC
  Administered 2022-01-03: 40 mg via INTRAMUSCULAR

## 2022-01-03 MED ORDER — KETOROLAC TROMETHAMINE 30 MG/ML IJ SOLN
30.0000 mg | Freq: Once | INTRAMUSCULAR | Status: AC
  Administered 2022-01-03: 30 mg via INTRAMUSCULAR
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
## 2022-01-03 NOTE — Assessment & Plan Note (Signed)
Worsening left-sided lumbar radiculopathy.  I think this is more of a flare.  Patient did have improvement.  Does seem to be worse with movement.  Can be muscular in nature.  Has had baclofen needed for breakthrough.  Toradol and Depo-Medrol given but did not want to do any other medication or try to delay within epidural with patient undergoing radio seed placement in the near future.  Discussed with patient about icing regimen and home exercises otherwise.  Follow-up with me again 6 weeks after his surgery.  Worsening pain in the back and he does need to seek medical attention immediately.

## 2022-01-04 ENCOUNTER — Encounter (HOSPITAL_BASED_OUTPATIENT_CLINIC_OR_DEPARTMENT_OTHER): Payer: Self-pay | Admitting: Urology

## 2022-01-04 NOTE — Progress Notes (Signed)
Spoke w/ via phone for pre-op interview--- William [name] Lab needs dos----   none            Lab results------ EKG current in Epic dated 06/2021 COVID test -----patient states asymptomatic no test needed Arrive at -------0530 NPO after MN NO Solid Food.   Med rec completed Medications to take morning of surgery ----- Cardizem. Zofran, Reglan, and Nurtec PRN. Diabetic medication ----- Patient instructed no nail polish to be worn day of surgery Patient instructed to bring photo id and insurance card day of surgery Patient aware to have Driver (ride ) / caregiver William [name] Wife    for 24 hours after surgery  Patient Special Instructions ----- Pre-Op special Istructions ----- Patient verbalized understanding of instructions that were given at this phone interview. Patient denies shortness of breath, chest pain, fever, cough at this phone interview.   LOV cardiologist 10/25/2021, f/u one year.

## 2022-01-05 ENCOUNTER — Ambulatory Visit: Payer: PPO | Admitting: Cardiology

## 2022-01-15 ENCOUNTER — Ambulatory Visit: Payer: PPO | Admitting: Neurology

## 2022-01-18 ENCOUNTER — Telehealth: Payer: Self-pay | Admitting: *Deleted

## 2022-01-18 NOTE — Telephone Encounter (Signed)
Called patient to remind of procedure for 01-19-22, spoke with patient and he is aware of this procedure

## 2022-01-18 NOTE — Progress Notes (Addendum)
  Radiation Oncology         [phone] ________________________________  Name: William [name] MRN: [id number]  Date: 01/19/2022  DOB: 04/15/1961       Prostate Seed Implant  CC:[name], MD  No ref. provider found  DIAGNOSIS:  61 y.o. gentleman with stage T1c adenocarcinoma of the prostate with a Gleason's score of 4+3 and a PSA of 5.3.  Oncology History  Malignant neoplasm of prostate ([place])  10/24/2021 Initial Diagnosis   Malignant neoplasm of prostate ([place])   10/24/2021 Cancer Staging   Staging form: Prostate, AJCC 8th Edition - Clinical: Stage IIC (cT1c, cN0, cM0, PSA: 5.3, Grade Group: 3) - Signed by Wyatt Portela, MD on 10/24/2021 Prostate specific antigen (PSA) range: Less than 10 Gleason score: 7 Histologic grading system: 5 grade system     No diagnosis found.  PROCEDURE: Insertion of radioactive I-125 seeds into the prostate gland.  RADIATION DOSE: 145 Gy, definitive therapy.  TECHNIQUE: Neeraj Housand was brought to the operating room with the urologist. He was placed in the dorsolithotomy position. He was catheterized and a rectal tube was inserted. The perineum was shaved, prepped and draped. The ultrasound probe was then introduced into the rectum to see the prostate gland.  TREATMENT DEVICE: A needle grid was attached to the ultrasound probe stand and anchor needles were placed.  3D PLANNING: The prostate was imaged in 3D using a sagittal sweep of the prostate probe. These images were transferred to the planning computer. There, the prostate, urethra and rectum were defined on each axial reconstructed image. Then, the software created an optimized 3D plan and a few seed positions were adjusted. The quality of the plan was reviewed using [facility] information for the target and the following two organs at risk:  Urethra and Rectum.  Then the accepted plan was printed and handed off to the radiation therapist.  Under my supervision, the custom loading of  the seeds and spacers was carried out and loaded into sealed vicryl sleeves.  These pre-loaded needles were then placed into the needle holder.[name]  PROSTATE VOLUME STUDY:  Using transrectal ultrasound the volume of the prostate was verified to be 30 cc.  SPECIAL TREATMENT PROCEDURE/SUPERVISION AND HANDLING: The pre-loaded needles were then delivered under sagittal guidance. A total of 18 needles were used to deposit 35 seeds in the prostate gland. The individual seed activity was 0.464 mCi.  SpaceOAR:  Yes  COMPLEX SIMULATION: At the end of the procedure, an anterior radiograph of the pelvis was obtained to document seed positioning and count. Cystoscopy was performed to check the urethra and bladder.  MICRODOSIMETRY: At the end of the procedure, the patient was emitting 0.20 mR/hr at 1 meter. Accordingly, he was considered safe for hospital discharge.  PLAN: The patient will return to the radiation oncology clinic for post implant CT dosimetry in three weeks.   ________________________________  Sheral Apley Tammi Klippel, M.D.

## 2022-01-18 NOTE — H&P (Signed)
William [name] presents today for pre-operative history and physical exam in anticipation of radioactive seed and space oar placement by Dr. Jeffie Pollock on 01/19/22. He is doing well and is without complaint.   William [name] denies F/C, HA, CP, SOB, N/V, diarrhea/constipation, back pain, flank pain, hematuria, and dysuria.    HX:     CC/HPI: I have prostate cancer.     8/9/23Kerry [name] returns today in f/u. He has prostate cancer and is scheduled for a seed implant in September. He presents today with increased voiding symptoms and marked nocturia with some dysuria Sunday night. He had no hematuria. He now just has slightly burning at the tip of the penis but his UA is clear. He left a specimen with his PCP but wasn't given an antibiotic. His IPSS is 11 with nocturia x 5 and urgency with frequency. He had a recent lumbar MRI that showed a cyst on his kidney. He had a PET for staging in 5/23 and had no renal stones.   10/12/21: William [name] is a 61 yo male who had a PSA of 5.3 and had a biopsy with Dr. Erlene Quan that showed a 77m prostate with a core of GG1, a core of GG2 and a core of GG3. He had a PMSA PET that just showed prostate uptake. His IPSS was 13 and SHIM was 5. He has long standing left testicular pain that is a throbbing pain. The testicle is not tender. The pain radiates from the left mid part of the pelvis to the testicle. He has some back pain and has had prior injections with Dr. ZHulan Saas the pain goes into the leg to the bottom of the foot. The pain is not positional and doesn't wake him from sleep. He has some relief with movement and [place] [facility] [place] [name] [place] [facility] [place] of [place] conflicts. He has migraines and sees neurology for that.    ALLERGIES: cefdinir - Diarrhea, Nausea, Vomiting Cephalexin CAPS - Diarrhea, Vomiting, Nausea Cephalosporins - Nausea, Vomiting, Diarrhea Compazine - uncontrollable shake/tremor Erythromycin - Anaphylaxis, Nausea, Vomiting Hydrocodone - Nausea,  Vomiting ketoconazole - Itching Latex - Itching nalbuphine - loss of consciousness Neosporin - Skin Rash Oxycodone - Swelling Prednisone - Vomiting, Nausea Prochlorperazine Edisylate - shakes/tremor Topamax - Nausea, Vomiting, Diarrhea Toradol SOLN - Vomiting, Nausea Tramadol - Vomiting, Nausea    MEDICATIONS: Diltiazem 12Hr Er  Emgality Pen  Flonase Allergy Relief  Silodosin 4 mg capsule 1 capsule PO Daily     GU PSH: Prostate Needle Biopsy - about 08/28/2021       PSH Notes: wisdom teeth  colonscopy   NON-GU PSH: None   GU PMH: BPH w/LUTS, He has moderate LUTS with primarily irritative symptoms. I have recommended he increase his fluid intake but I will try him on silodosin '4mg'$  since he has a tendency to low BP. He will keep his current f/u. Side effect reviewed. - 12/06/2021 Dysuria - 12/06/2021 Nocturia - 12/06/2021 Prostate Cancer, He is scheduled for a seed implant on 01/19/22. - 12/06/2021, - 10/24/2021, He has T1c N0 M0 GG3 unfavorable intermediate risk prostate cancer with moderate LUTS, severe ED and a 558mprostate. I discussed RALP, EXRT and seeds along with the possible need for ADT and mentioned cryotherapy and HIFU. At his age and with his history of pelvic pain and a possible prior history of IC, a surgical approach would be a good option for him but he is a candidate for radiation but I am not sure if  he would qualify for seed monotherapy with his GG3 disease. He is not ready to make up his mind yet. I will see if I can get him into the [facility] for further discussion with one of our surgeons and Dr. Tammi Klippel. , - 10/04/2021 Urinary Urgency - 12/06/2021 Pelvic/perineal pain - 11/21/2021, - 10/26/2021, - 10/12/2021, - 10/11/2021, He has left groin and testicular pain but the testicle is palpably normal and non-tender and with the associated back pain and left sciatica I feel there is a more proximal cause of his genital pain. He has had prior epidurals but hasn't had a lumbar MRI since 2019. I  will start by having him see our William [name] team and we can evaluate further based on his response. , - 10/04/2021, He does have a left inguinal hernia which is minimally/moderately tender. This does reproduce his symptoms. However, he does have significant left pelvic floor tenderness on examination., - 2018 Microscopic hematuria, He has dipstick positive hematuria but negative microscopic exam. He has had normal upper tracts on CT scan. I do not think he needs further evaluation at this point. - 2018 Bladder disorder, Unspec, Bladder disorder - 2014 Interstitial Cystitis (w/o hematuria)      PMH Notes: Migraine Headaches   NON-GU PMH: Lumbago with sciatica, left side - 11/21/2021, - 10/26/2021, - 10/12/2021, - 10/11/2021, - 10/04/2021 Other muscle spasm - 11/21/2021, - 10/26/2021, - 10/12/2021, - 10/11/2021 Anxiety Arthritis Cardiac murmur, unspecified Depression GERD Hypercholesterolemia    FAMILY HISTORY: Hypertension - Mother, Brother Kidney Failure - Grandmother Prostate Cancer - Grandfather   SOCIAL HISTORY: Marital Status: Married Preferred Language: English; Ethnicity: Not Hispanic Or Latino; Race: Black or African American Current Smoking Status: Patient has never smoked.   Tobacco Use Assessment Completed: Used Tobacco in last 30 days? Does not use smokeless tobacco. Has never drank.  Does not use drugs. Does not drink caffeine. Has not had a blood transfusion.    REVIEW OF SYSTEMS:    GU Review Male:   Patient denies frequent urination, hard to postpone urination, burning/ pain with urination, get up at night to urinate, leakage of urine, stream starts and stops, trouble starting your stream, have to strain to urinate , erection problems, and penile pain.  Gastrointestinal (Upper):   Patient denies nausea, vomiting, and indigestion/ heartburn.  Gastrointestinal (Lower):   Patient denies diarrhea and constipation.  Constitutional:   Patient denies fever, night sweats, weight loss, and  fatigue.  Skin:   Patient denies skin rash/ lesion and itching.  Eyes:   Patient denies blurred vision and double vision.  Ears/ Nose/ Throat:   Patient denies sore throat and sinus problems.  Hematologic/Lymphatic:   Patient denies swollen glands and easy bruising.  Cardiovascular:   Patient denies leg swelling and chest pains.  Respiratory:   Patient denies cough and shortness of breath.  Endocrine:   Patient denies excessive thirst.  Musculoskeletal:   Patient denies back pain and joint pain.  Neurological:   Patient denies headaches and dizziness.  Psychologic:   Patient denies depression and anxiety.   VITAL SIGNS:      01/09/2022 01:08 PM  BP 114/78 mmHg  Pulse 78 /min  Temperature 97.8 F / 36.5 C   MULTI-SYSTEM PHYSICAL EXAMINATION:    Constitutional: Well-nourished. No physical deformities. Normally developed. Good grooming.  Neck: Neck symmetrical, not swollen. Normal tracheal position.  Respiratory: Normal breath sounds. No labored breathing, no use of accessory muscles.   Cardiovascular: Regular rate and rhythm. I/VI sys  murmur, no gallop.   Lymphatic: No enlargement of neck, axillae, groin.  Skin: No paleness, no jaundice, no cyanosis. No lesion, no ulcer, no rash.  Neurologic / Psychiatric: Oriented to time, oriented to place, oriented to person. No depression, no anxiety, no agitation.  Gastrointestinal: No mass, no tenderness, no rigidity, non obese abdomen.  Eyes: Normal conjunctivae. Normal eyelids.  Ears, Nose, Mouth, and Throat: Left ear no scars, no lesions, no masses. Right ear no scars, no lesions, no masses. Nose no scars, no lesions, no masses. Normal hearing. Normal lips.  Musculoskeletal: Normal gait and station of head and neck.     Complexity of Data:  Records Review:   Previous Patient Records  Urine Test Review:   Urinalysis   01/09/22  Urinalysis  Urine Appearance Clear   Urine Color Straw   Urine Glucose Neg mg/dL  Urine Bilirubin Neg mg/dL   Urine Ketones Neg mg/dL  Urine Specific Gravity <=1.005   Urine Blood Neg ery/uL  Urine pH 6.5   Urine Protein Neg mg/dL  Urine Urobilinogen 0.2 mg/dL  Urine Nitrites Neg   Urine Leukocyte Esterase Neg leu/uL   PROCEDURES:          Urinalysis Dipstick Dipstick Cont'd  Color: Straw Bilirubin: Neg mg/dL  Appearance: Clear Ketones: Neg mg/dL  Specific Gravity: <=1.005 Blood: Neg ery/uL  pH: 6.5 Protein: Neg mg/dL  Glucose: Neg mg/dL Urobilinogen: 0.2 mg/dL    Nitrites: Neg    Leukocyte Esterase: Neg leu/uL    ASSESSMENT:      ICD-10 Details  1 GU:   Prostate Cancer - C61    PLAN:           Schedule Return Visit/Planned Activity: Keep Scheduled Appointment - Schedule Surgery          Document Letter(s):  Created for Patient: Clinical Summary         Notes:   There are no changes in the patients history or physical exam since last evaluation by Dr. Jeffie Pollock. William [name] is scheduled to undergo seeds and space oar on 01/19/22.   All William [name]'s questions were answered to the best of my ability.          Next Appointment:      Next Appointment: 01/17/2022 08:00 AM    Appointment Type: 60 Physical Therapy    Location: [facility] [phone]    Provider: Nyra Capes

## 2022-01-19 ENCOUNTER — Encounter (HOSPITAL_BASED_OUTPATIENT_CLINIC_OR_DEPARTMENT_OTHER): Payer: Self-pay | Admitting: Urology

## 2022-01-19 ENCOUNTER — Ambulatory Visit (HOSPITAL_BASED_OUTPATIENT_CLINIC_OR_DEPARTMENT_OTHER): Payer: PPO | Admitting: Anesthesiology

## 2022-01-19 ENCOUNTER — Ambulatory Visit (HOSPITAL_BASED_OUTPATIENT_CLINIC_OR_DEPARTMENT_OTHER)
Admission: RE | Admit: 2022-01-19 | Discharge: 2022-01-19 | Disposition: A | Discharge status: Home / Self Care | Payer: PPO | Attending: Urology | Admitting: Urology

## 2022-01-19 ENCOUNTER — Other Ambulatory Visit: Payer: Self-pay

## 2022-01-19 ENCOUNTER — Encounter (HOSPITAL_BASED_OUTPATIENT_CLINIC_OR_DEPARTMENT_OTHER)
Admission: RE | Disposition: A | Discharge status: Home / Self Care | Payer: Self-pay | Source: Home / Self Care | Attending: Urology

## 2022-01-19 ENCOUNTER — Ambulatory Visit (HOSPITAL_COMMUNITY): Payer: PPO

## 2022-01-19 DIAGNOSIS — M1990 Unspecified osteoarthritis, unspecified site: Secondary | ICD-10-CM | POA: Diagnosis not present

## 2022-01-19 DIAGNOSIS — R351 Nocturia: Secondary | ICD-10-CM | POA: Insufficient documentation

## 2022-01-19 DIAGNOSIS — N50812 Left testicular pain: Secondary | ICD-10-CM | POA: Insufficient documentation

## 2022-01-19 DIAGNOSIS — F418 Other specified anxiety disorders: Secondary | ICD-10-CM

## 2022-01-19 DIAGNOSIS — C61 Malignant neoplasm of prostate: Secondary | ICD-10-CM | POA: Insufficient documentation

## 2022-01-19 DIAGNOSIS — N289 Disorder of kidney and ureter, unspecified: Secondary | ICD-10-CM

## 2022-01-19 DIAGNOSIS — R300 Dysuria: Secondary | ICD-10-CM | POA: Diagnosis not present

## 2022-01-19 HISTORY — DX: Chronic kidney disease, unspecified: N18.9

## 2022-01-19 HISTORY — PX: SPACE OAR INSTILLATION: SHX6769

## 2022-01-19 HISTORY — DX: Diverticulitis of intestine, part unspecified, without perforation or abscess without bleeding: K57.92

## 2022-01-19 HISTORY — PX: RADIOACTIVE SEED IMPLANT: SHX5150

## 2022-01-19 SURGERY — INSERTION, RADIATION SOURCE, PROSTATE
Anesthesia: General | Site: Prostate

## 2022-01-19 MED ORDER — AMISULPRIDE (ANTIEMETIC) 5 MG/2ML IV SOLN: 10.0000 mg | Freq: Once | INTRAVENOUS | Status: DC | PRN

## 2022-01-19 MED ORDER — SODIUM CHLORIDE (PF) 0.9 % IJ SOLN
INTRAMUSCULAR | Status: DC | PRN
  Administered 2022-01-19: 10 mL

## 2022-01-19 MED ORDER — IOHEXOL 300 MG/ML  SOLN
INTRAMUSCULAR | Status: DC | PRN
  Administered 2022-01-19: 7 mL

## 2022-01-19 MED ORDER — STERILE WATER FOR IRRIGATION IR SOLN
Status: DC | PRN
  Administered 2022-01-19: 500 mL

## 2022-01-19 MED ORDER — MEPERIDINE HCL 25 MG/ML IJ SOLN: 6.2500 mg | INTRAMUSCULAR | Status: DC | PRN

## 2022-01-19 MED ORDER — PROPOFOL 10 MG/ML IV BOLUS
INTRAVENOUS | Status: AC
  Filled 2022-01-19: qty 20

## 2022-01-19 MED ORDER — HYDROMORPHONE HCL 2 MG PO TABS: 2.0000 mg | ORAL_TABLET | ORAL | Status: DC | PRN

## 2022-01-19 MED ORDER — FENTANYL CITRATE (PF) 100 MCG/2ML IJ SOLN
INTRAMUSCULAR | Status: AC
  Filled 2022-01-19: qty 2

## 2022-01-19 MED ORDER — FLEET ENEMA 7-19 GM/118ML RE ENEM: 1.0000 | ENEMA | Freq: Once | RECTAL | Status: DC

## 2022-01-19 MED ORDER — ROCURONIUM BROMIDE 10 MG/ML (PF) SYRINGE
PREFILLED_SYRINGE | INTRAVENOUS | Status: DC | PRN
  Administered 2022-01-19: 60 mg via INTRAVENOUS

## 2022-01-19 MED ORDER — HYDROMORPHONE HCL 1 MG/ML IJ SOLN
0.2500 mg | INTRAMUSCULAR | Status: DC | PRN
  Administered 2022-01-19: 0.5 mg via INTRAVENOUS
  Administered 2022-01-19: 0.25 mg via INTRAVENOUS
  Administered 2022-01-19: 0.5 mg via INTRAVENOUS
  Administered 2022-01-19: 0.25 mg via INTRAVENOUS

## 2022-01-19 MED ORDER — MIDAZOLAM HCL 2 MG/2ML IJ SOLN
INTRAMUSCULAR | Status: DC | PRN
  Administered 2022-01-19: 2 mg via INTRAVENOUS

## 2022-01-19 MED ORDER — MIDAZOLAM HCL 2 MG/2ML IJ SOLN
INTRAMUSCULAR | Status: AC
  Filled 2022-01-19: qty 2

## 2022-01-19 MED ORDER — SODIUM CHLORIDE 0.9% FLUSH: 3.0000 mL | Freq: Two times a day (BID) | INTRAVENOUS | Status: DC

## 2022-01-19 MED ORDER — SODIUM CHLORIDE 0.9 % IV SOLN
INTRAVENOUS | Status: AC | PRN
  Administered 2022-01-19: 200 mL

## 2022-01-19 MED ORDER — FENTANYL CITRATE (PF) 250 MCG/5ML IJ SOLN
INTRAMUSCULAR | Status: DC | PRN
  Administered 2022-01-19: 50 ug via INTRAVENOUS
  Administered 2022-01-19 (×2): 25 ug via INTRAVENOUS

## 2022-01-19 MED ORDER — CIPROFLOXACIN IN D5W 400 MG/200ML IV SOLN
400.0000 mg | INTRAVENOUS | Status: AC
  Administered 2022-01-19: 400 mg via INTRAVENOUS

## 2022-01-19 MED ORDER — LIDOCAINE 2% (20 MG/ML) 5 ML SYRINGE
INTRAMUSCULAR | Status: DC | PRN
  Administered 2022-01-19: 60 mg via INTRAVENOUS

## 2022-01-19 MED ORDER — LACTATED RINGERS IV SOLN: INTRAVENOUS | Status: DC

## 2022-01-19 MED ORDER — ONDANSETRON HCL 4 MG/2ML IJ SOLN
INTRAMUSCULAR | Status: DC | PRN
  Administered 2022-01-19: 4 mg via INTRAVENOUS

## 2022-01-19 MED ORDER — HYDROMORPHONE HCL 1 MG/ML IJ SOLN
INTRAMUSCULAR | Status: AC
  Filled 2022-01-19: qty 1

## 2022-01-19 MED ORDER — SUGAMMADEX SODIUM 200 MG/2ML IV SOLN
INTRAVENOUS | Status: DC | PRN
  Administered 2022-01-19: 250 mg via INTRAVENOUS

## 2022-01-19 MED ORDER — DEXAMETHASONE SODIUM PHOSPHATE 10 MG/ML IJ SOLN
INTRAMUSCULAR | Status: DC | PRN
  Administered 2022-01-19: 5 mg via INTRAVENOUS

## 2022-01-19 MED ORDER — PROPOFOL 10 MG/ML IV BOLUS
INTRAVENOUS | Status: DC | PRN
  Administered 2022-01-19: 150 mg via INTRAVENOUS

## 2022-01-19 MED ORDER — CIPROFLOXACIN IN D5W 400 MG/200ML IV SOLN
INTRAVENOUS | Status: AC
  Filled 2022-01-19: qty 200

## 2022-01-19 MED ORDER — SODIUM CHLORIDE 0.9 % IV SOLN: INTRAVENOUS | Status: DC

## 2022-01-19 MED ORDER — HYDROMORPHONE HCL 2 MG PO TABS: 2.0000 mg | ORAL_TABLET | ORAL | 0 refills | Status: DC | PRN

## 2022-01-19 SURGICAL SUPPLY — 41 items
BAG DRN RND TRDRP ANRFLXCHMBR (UROLOGICAL SUPPLIES) ×1
BAG URINE DRAIN 2000ML AR STRL (UROLOGICAL SUPPLIES) ×1 IMPLANT
BLADE CLIPPER SENSICLIP SURGIC (BLADE) ×1 IMPLANT
CATH FOLEY LATEX FREE 20FR (CATHETERS) ×1
CATH FOLEY LF 20FR (CATHETERS) IMPLANT
CATH SILICONE 16FRX5CC (CATHETERS) IMPLANT
CLOTH BEACON ORANGE TIMEOUT ST (SAFETY) ×1 IMPLANT
CNTNR URN SCR LID CUP LEK RST (MISCELLANEOUS) ×1 IMPLANT
CONT SPEC 4OZ STRL OR WHT (MISCELLANEOUS) ×1
COVER BACK TABLE 60X90IN (DRAPES) ×1 IMPLANT
COVER MAYO STAND STRL (DRAPES) ×1 IMPLANT
DRSG TEGADERM 4X4.75 (GAUZE/BANDAGES/DRESSINGS) ×1 IMPLANT
DRSG TEGADERM 8X12 (GAUZE/BANDAGES/DRESSINGS) ×1 IMPLANT
GAUZE SPONGE 4X4 12PLY STRL (GAUZE/BANDAGES/DRESSINGS) IMPLANT
GEL ULTRASOUND 20GR AQUASONIC (MISCELLANEOUS) ×2 IMPLANT
GLOVE BIOGEL PI IND STRL 7.0 (GLOVE) IMPLANT
GLOVE SURG ORTHO 8.5 STRL (GLOVE) ×1 IMPLANT
GLOVE SURG SS PI 6.5 STRL IVOR (GLOVE) IMPLANT
GLOVE SURG SS PI 7.5 STRL IVOR (GLOVE) IMPLANT
GLOVE SURG SS PI 8.0 STRL IVOR (GLOVE) ×2 IMPLANT
GOWN STRL REUS W/TWL LRG LVL3 (GOWN DISPOSABLE) ×1 IMPLANT
GRID BRACH TEMP 18GA 2.8X3X.75 (MISCELLANEOUS) ×1 IMPLANT
HOLDER FOLEY CATH W/STRAP (MISCELLANEOUS) ×1 IMPLANT
I-125 seeds IMPLANT
IMPL SPACEOAR SYSTEM 10ML (Spacer) IMPLANT
IMPLANT SPACEOAR SYSTEM 10ML (Spacer) ×1 IMPLANT
IV NS 1000ML (IV SOLUTION) ×1
IV NS 1000ML BAXH (IV SOLUTION) ×1 IMPLANT
KIT TURNOVER CYSTO (KITS) ×1 IMPLANT
NDL BRACHY 18G 5PK (NEEDLE) ×4 IMPLANT
NDL PK MORGANSTERN STABILIZ (NEEDLE) ×1 IMPLANT
NEEDLE BRACHY 18G 5PK (NEEDLE) ×4 IMPLANT
NEEDLE PK MORGANSTERN STABILIZ (NEEDLE) ×1 IMPLANT
PACK CYSTO (CUSTOM PROCEDURE TRAY) ×1 IMPLANT
SHEATH ULTRASOUND LF (SHEATH) IMPLANT
SPIKE FLUID TRANSFER (MISCELLANEOUS) IMPLANT
SYR 10ML LL (SYRINGE) IMPLANT
SYR CONTROL 10ML LL (SYRINGE) ×1 IMPLANT
TOWEL OR 17X26 10 PK STRL BLUE (TOWEL DISPOSABLE) ×1 IMPLANT
UNDERPAD 30X36 HEAVY ABSORB (UNDERPADS AND DIAPERS) ×2 IMPLANT
WATER STERILE IRR 500ML POUR (IV SOLUTION) ×1 IMPLANT

## 2022-01-19 NOTE — Transfer of Care (Signed)
Immediate Anesthesia Transfer of Care Note  Patient: William [name]  Procedure(s) Performed: RADIOACTIVE SEED IMPLANT/BRACHYTHERAPY IMPLANT (Prostate) SPACE OAR INSTILLATION (Prostate)  Patient Location: PACU  Anesthesia Type:General  Level of Consciousness: drowsy and patient cooperative  Airway & Oxygen Therapy: Patient Spontanous Breathing  Post-op Assessment: Report given to RN and Post -op Vital signs reviewed and stable  Post vital signs: Reviewed and stable  Last Vitals:  Vitals Value Taken Time  BP 122/73 01/19/22 0900  Temp    Pulse 83 01/19/22 0902  Resp 15 01/19/22 0902  SpO2 99 % 01/19/22 0902  Vitals shown include unvalidated device data.  Last Pain:  Vitals:   01/19/22 4536  TempSrc: Oral  PainSc: 0-No pain      Patients Stated Pain Goal: 4 (46/80/32 1224)  Complications: No notable events documented.

## 2022-01-19 NOTE — Anesthesia Postprocedure Evaluation (Signed)
Anesthesia Post Note  Patient: William [name]  Procedure(s) Performed: RADIOACTIVE SEED IMPLANT/BRACHYTHERAPY IMPLANT (Prostate) SPACE OAR INSTILLATION (Prostate)     Patient location during evaluation: PACU Anesthesia Type: General Level of consciousness: awake and alert Pain management: pain level controlled Vital Signs Assessment: post-procedure vital signs reviewed and stable Respiratory status: spontaneous breathing, nonlabored ventilation and respiratory function stable Cardiovascular status: blood pressure returned to baseline and stable Postop Assessment: no apparent nausea or vomiting Anesthetic complications: no   No notable events documented.  Last Vitals:  Vitals:   01/19/22 0915 01/19/22 0930  BP: 119/77 (!) 110/97  Pulse: 82 74  Resp: 16 14  Temp:    SpO2: 97% 97%    Last Pain:  Vitals:   01/19/22 0930  TempSrc:   PainSc: [place]

## 2022-01-19 NOTE — Discharge Instructions (Addendum)
  Post Anesthesia Home Care Instructions  Activity: Get plenty of rest for the remainder of the day. A responsible individual must stay with you for 24 hours following the procedure.  For the next 24 hours, DO NOT: -Drive a car -Paediatric nurse -Drink alcoholic beverages -Take any medication unless instructed by your physician -Make any legal decisions or sign important papers.  Meals: Start with liquid foods such as gelatin or soup. Progress to regular foods as tolerated. Avoid greasy, spicy, heavy foods. If nausea and/or vomiting occur, drink only clear liquids until the nausea and/or vomiting subsides. Call your physician if vomiting continues.  Special Instructions/Symptoms: Your throat may feel dry or sore from the anesthesia or the breathing tube placed in your throat during surgery. If this causes discomfort, gargle with warm salt water. The discomfort should disappear within 24 hours.      Radioactive Seed Implant Home Care Instructions   Activity:    Rest for the remainder of the day.  Do not drive or operate equipment today.  You may resume normal  activities in a few days as instructed by your physician, without risk of harmful radiation exposure to those around you, provided you follow the time and distance precautions on the Radiation Oncology Instruction Sheet.   Meals: Drink plenty of lipuids and eat light foods, such as gelatin or soup this evening .  You may return to normal meal plan tomorrow.  Return To Work: You may return to work as instructed by [employer].  Special Instruction:   If any seeds are found, use tweezers to pick up seeds and place in a glass container of any kind and bring to your physician's office.  Call your physician if any of these symptoms occur:  Persistent or heavy bleeding Urine stream diminishes or stops completely after catheter is removed Fever equal to or greater than 101 degrees F Cloudy urine with a strong foul  odor Severe pain  You may feel some burning pain and/or hesitancy when you urinate after the catheter is removed.  These symptoms may increase over the next few weeks, but should diminish within forur to six weeks.  Applying moist heat to the lower abdomen or a hot tub bath may help relieve the pain.  If the discomfort becomes severe, please call your physician for additional medications.

## 2022-01-19 NOTE — Anesthesia Procedure Notes (Signed)
Procedure Name: Intubation Date/Time: 01/19/2022 7:43 AM  Performed by: Clearnce Sorrel, CRNAPre-anesthesia Checklist: Patient identified, Emergency Drugs available, Suction available and Patient being monitored Patient Re-evaluated:Patient Re-evaluated prior to induction Oxygen Delivery Method: Circle System Utilized Preoxygenation: Pre-oxygenation with 100% oxygen Induction Type: IV induction Ventilation: Mask ventilation without difficulty Laryngoscope Size: Mac and 4 Grade View: Grade I Tube type: Oral Tube size: 7.5 mm Number of attempts: 1 Airway Equipment and Method: Stylet and Oral airway Placement Confirmation: ETT inserted through vocal cords under direct vision, positive ETCO2 and breath sounds checked- equal and bilateral Secured at: 23 cm Tube secured with: Tape Dental Injury: Teeth and Oropharynx as per pre-operative assessment

## 2022-01-19 NOTE — Interval H&P Note (Signed)
History and Physical Interval Note:  01/19/2022 7:15 AM  William [name]  has presented today for surgery, with the diagnosis of PROSTATE CANCER.  The various methods of treatment have been discussed with the patient and family. After consideration of risks, benefits and other options for treatment, the patient has consented to  Procedure(s): RADIOACTIVE SEED IMPLANT/BRACHYTHERAPY IMPLANT (N/A) SPACE OAR INSTILLATION (N/A) as a surgical intervention.  The patient's history has been reviewed, patient examined, no change in status, stable for surgery.  I have reviewed the patient's chart and labs.  Questions were answered to the patient's satisfaction.     [name]

## 2022-01-19 NOTE — Op Note (Signed)
PATIENT:  [name]  PRE-OPERATIVE DIAGNOSIS:  Adenocarcinoma of the prostate  POST-OPERATIVE DIAGNOSIS:  Same  PROCEDURE:  Procedure(s): 1. I-125 radioactive seed implantation 2. SpaceOAR implantation. 3.  Cystoscopy  SURGEON:  Surgeon(s): [name] MD  Radiation oncologist: Dr. Tyler Pita  ANESTHESIA:  General  EBL:  Minimal  DRAINS: 38 French Foley catheter  INDICATION: William [name] is a 61 y.o. with Stage T1c, Gleason 7(4+3)  prostate cancer who has elected brachytherapy for treatment.  Description of procedure: After informed consent the patient was brought to the major OR, placed on the table and administered general anesthesia. He was then moved to the modified lithotomy position with his perineum perpendicular to the floor. His perineum and genitalia were then sterilely prepped. An official timeout was then performed. A 16 French Foley catheter was then placed in the bladder and filled with dilute contrast, a rectal tube was placed in the rectum and the transrectal ultrasound probe was placed in the rectum and affixed to the stand. He was then sterilely draped.  The sterile grid was installed.   Anchor needles were then placed.   Real time ultrasonography was used along with the seed planning software spot-pro version 3.1-00. This was used to develop the seed plan including the number of needles as well as number of seeds required for complete and adequate coverage. Real-time ultrasonography was then used along with the previously developed plan  to implant a total of 53 seeds using 18 needles for a target dose of 145 Gy. This proceeded without difficulty or complication.  The anchor needles and guide were removed and the SpaceOAR needle was passed under US guidance into the fat stripe posterior to the prostate with the tip in the midline at mid prostate. A puff of NS confirmed appropriate positioning and the SpaceOAR polymer was then injected over 10  seconds into the space with excellent distribution.     A Foley catheter was then removed as well as the transrectal ultrasound probe and rectal probe. Flexible cystoscopy was then performed using the 17 French flexible scope which revealed a normal urethra throughout its length down to the sphincter which appeared intact. The prostatic urethra was short with bilobar hyperplasia and mild coaptation. The bladder was then entered and fully and systematically inspected.  The ureteral orifices were noted to be of normal configuration and position. The mucosa revealed no evidence of tumors. There were also no stones identified within the bladder.  No  seeds or spacers were seen and/or removed from the bladder.  The cystoscope was then removed.  The drapes were removed.  The perineum was cleaned and dressed.  He was taken out of the lithotomy position and was awakened and taken to recovery room in stable and satisfactory condition. He tolerated procedure well and there were no intraoperative complications.

## 2022-01-19 NOTE — Anesthesia Preprocedure Evaluation (Signed)
Anesthesia Evaluation  Patient identified by MRN, date of birth, ID band Patient awake    Reviewed: Allergy & Precautions, NPO status , Patient's Chart, lab work & pertinent test results  Airway Mallampati: II  TM Distance: >3 FB Neck ROM: Full    Dental no notable dental hx.    Pulmonary neg pulmonary ROS,    Pulmonary exam normal breath sounds clear to auscultation       Cardiovascular negative cardio ROS Normal cardiovascular exam Rhythm:Regular Rate:Normal     Neuro/Psych  Headaches, Anxiety Depression negative psych ROS   GI/Hepatic Neg liver ROS, GERD  ,  Endo/Other  negative endocrine ROS  Renal/GU Renal InsufficiencyRenal disease  negative genitourinary   Musculoskeletal  (+) Arthritis , Osteoarthritis,    Abdominal   Peds negative pediatric ROS (+)  Hematology negative hematology ROS (+)   Anesthesia Other Findings Prostate Cancer  Reproductive/Obstetrics negative OB ROS                             Anesthesia Physical Anesthesia Plan  ASA: 3  Anesthesia Plan: General   Post-op Pain Management: Minimal or no pain anticipated   Induction: Intravenous  PONV Risk Score and Plan: 2 and Ondansetron, Midazolam and Treatment may vary due to age or medical condition  Airway Management Planned: Oral ETT  Additional Equipment:   Intra-op Plan:   Post-operative Plan: Extubation in OR  Informed Consent: I have reviewed the patients History and Physical, chart, labs and discussed the procedure including the risks, benefits and alternatives for the proposed anesthesia with the patient or authorized representative who has indicated his/her understanding and acceptance.     Dental advisory given  Plan Discussed with: CRNA  Anesthesia Plan Comments:         Anesthesia Quick Evaluation

## 2022-01-22 ENCOUNTER — Encounter (HOSPITAL_BASED_OUTPATIENT_CLINIC_OR_DEPARTMENT_OTHER): Payer: Self-pay | Admitting: Urology

## 2022-01-23 ENCOUNTER — Other Ambulatory Visit: Payer: Self-pay | Admitting: Urology

## 2022-01-23 DIAGNOSIS — C61 Malignant neoplasm of prostate: Secondary | ICD-10-CM

## 2022-01-24 ENCOUNTER — Telehealth: Payer: Self-pay

## 2022-01-24 NOTE — Telephone Encounter (Signed)
RN returned call to patient needed advise on treatment for rectal discomfort.

## 2022-01-26 ENCOUNTER — Telehealth: Payer: Self-pay | Admitting: *Deleted

## 2022-01-26 NOTE — Telephone Encounter (Signed)
Called patient to inform of post seed appts. for 02-02-22- arrival time- 9:15 am @ [facility], patient to have MRI on 02-02-22- arrival time- 4:30 pm @ [facility], no restrictions to test, spoke with patient and he is aware of these appts. and the instructions

## 2022-02-01 NOTE — Progress Notes (Signed)
Radiation Oncology         [phone] ________________________________  Name: William [name] MRN: [id number]  Date: 02/02/2022  DOB: Jun 04, 1960  Post-Seed Follow-Up Visit Note  CC: [name], MD  [name], MD  Diagnosis:    61 y.o. gentleman with stage T1c adenocarcinoma of the prostate with a Gleason's score of 4+3 and a PSA of 5.3.     ICD-10-CM   1. Malignant neoplasm of prostate (HCC)  C61       Interval Since Last Radiation:  2 weeks 01/19/22:  Insertion of radioactive I-125 seeds into the prostate gland; 145 Gy, definitive therapy with placement of SpaceOAR gel.  Narrative:  The patient returns today for routine follow-up.  He is complaining of increased urinary frequency and urinary hesitation symptoms. He filled out a questionnaire regarding urinary function today providing and overall IPSS score of 22 characterizing his symptoms as severe.  His pre-implant score was 15.  He continues to struggle with significant dysuria at the start of his stream, weak stream, hesitancy, intermittency, frequency, urgency and has had a single episode of urge incontinence when trying to get to the bathroom in time last night.  He denies gross hematuria, fever, chills or night sweats.  He is also having issues with rectal pressure, irritation and constipation and is using MiraLAX and Proctocort cream to help in that department.  He was seen in the urology office on 01/31/2022 and advised to take his Rapaflo daily as prescribed as well as a trial of Gemtesa to see if that would help with the bladder spasms since his PVR was 0 in the office that day.  He is also been using AZO as needed and does appreciate benefit when he is taking this.  He was unsure if he could take the Rapaflo, Gemtesa and AZO altogether so he has not been taking the prescribed regimen.  He denies any abdominal pain, nausea or vomiting and reports a healthy appetite.  He continues with some mild fatigue but overall, feels  that he is making gradual progress.  ALLERGIES:  is allergic to erythromycin, nalbuphine, cefdinir, cephalexin, cephalosporins, hydrocodone-acetaminophen, ketorolac, prochlorperazine edisylate, restasis [cyclosporine], tyloxapol, bacitracin-polymyxin b, codeine, nalbuphine hcl, prochlorperazine, propranolol, topamax [topiramate], zetia [ezetimibe], cefdinir, doxycycline hyclate, iodinated contrast media, ketoconazole, ketorolac tromethamine, latex, neosporin [neomycin-bacitracin zn-polymyx], oxycodone-acetaminophen, and prednisone.  Meds: Current Outpatient Medications  Medication Sig Dispense Refill   baclofen (LIORESAL) 10 MG tablet Take 0.5 tablets (5 mg total) by mouth at bedtime as needed for muscle spasms. 30 each 0   diltiazem (CARDIZEM CD) 120 MG 24 hr capsule Take 1 capsule (120 mg total) by mouth daily. 90 capsule 3   diltiazem (CARDIZEM) 30 MG tablet TAKE (1) TABLET AS NEEDED FOR PALPITATIONS 30 tablet 11   EMGALITY 120 MG/ML SOAJ as directed.     fluticasone (FLONASE) 50 MCG/ACT nasal spray Place 2 sprays into both nostrils daily. 16 g 6   HYDROmorphone (DILAUDID) 2 MG tablet Take 1 tablet (2 mg total) by mouth every 4 (four) hours as needed for moderate pain. 6 tablet 0   metoCLOPramide (REGLAN) 5 MG tablet Take 5 mg by mouth as needed.     ondansetron (ZOFRAN-ODT) 8 MG disintegrating tablet Take 8 mg by mouth 3 (three) times daily as needed.     Rimegepant Sulfate (NURTEC) 75 MG TBDP Take 75 mg by mouth as needed for up to 1 dose (Maximum 1 tablet in 24 hours.). 8 tablet 5   No current facility-administered  medications for this visit.    Physical Findings: In general this is a well appearing African American male in no acute distress. He's alert and oriented x4 and appropriate throughout the examination. Cardiopulmonary assessment is negative for acute distress and he exhibits normal effort.   Lab Findings: Lab Results  Component Value Date   WBC 7.3 10/18/2020   HGB 15.4  10/18/2020   HCT 46.3 10/18/2020   MCV 96.8 10/18/2020   PLT 234.0 10/18/2020    Radiographic Findings:  Patient underwent CT imaging in our clinic for post implant dosimetry. The CT will be reviewed by Dr. Tammi Klippel to confirm there is an adequate distribution of radioactive seeds throughout the prostate gland and ensure that there are no seeds in or near the rectum.  We suspect the final radiation plan and dosimetry will show appropriate coverage of the prostate gland. He understands that we will call and inform him of any unexpected findings on further review of his imaging and dosimetry.  Impression/Plan:  61 y.o. gentleman with stage T1c adenocarcinoma of the prostate with a Gleason's score of 4+3 and a PSA of 5.3.  The patient is recovering from the effects of radiation. His urinary symptoms should gradually improve over the next 4-6 months. We talked about this today. He is encouraged to know that he can take the AZO, Gemtesa and Rapaflo together and likely get significant relief.  Otherwise, he is pleased with his outcome. We also talked about long-term follow-up for prostate cancer following seed implant. He understands that ongoing PSA determinations and digital rectal exams will help perform surveillance to rule out disease recurrence. He saw Daine Gravel, NP on 01/31/22 due to severe LUTS and rectal discomfort despite taking Rapaflo daily. He has a follow up appointment scheduled with Nonie Hoyer, NP on 02/12/22 and will report his improvement in symptoms once he starts taking the prescribed regimen. He understands what to expect with his PSA measures. Patient was also educated today about some of the long-term effects from radiation including a small risk for rectal bleeding and possibly erectile dysfunction. We talked about some of the general management approaches to these potential complications. However, I did encourage the patient to contact our office or return at any point if he has questions  or concerns related to his previous radiation and prostate cancer.    Nicholos Johns, PA-C

## 2022-02-01 NOTE — Progress Notes (Signed)
  Radiation Oncology         [phone]) [phone] ________________________________  Name: William [name] MRN: [id number]  Date: 02/02/2022  DOB: [birth year]  COMPLEX SIMULATION NOTE  NARRATIVE:  The patient was brought to the [facility] today following prostate seed implantation approximately one month ago.  Identity was confirmed.  All relevant records and images related to the planned course of therapy were reviewed.  Then, the patient was set-up supine.  CT images were obtained.  The CT images were loaded into the planning software.  Then the prostate and rectum were contoured.  Treatment planning then occurred.  The implanted iodine 125 seeds were identified by the physics staff for projection of radiation distribution  I have requested : 3D Simulation  I have requested a DVH of the following structures: Prostate and rectum.    ________________________________  Sheral Apley Tammi Klippel, M.D.

## 2022-02-02 ENCOUNTER — Ambulatory Visit (HOSPITAL_COMMUNITY)
Admission: RE | Admit: 2022-02-02 | Discharge: 2022-02-02 | Disposition: A | Discharge status: Home / Self Care | Payer: PPO | Source: Ambulatory Visit | Attending: Urology | Admitting: Urology

## 2022-02-02 ENCOUNTER — Other Ambulatory Visit: Payer: Self-pay

## 2022-02-02 ENCOUNTER — Ambulatory Visit
Admission: RE | Admit: 2022-02-02 | Discharge: 2022-02-02 | Disposition: A | Discharge status: Home / Self Care | Payer: PPO | Source: Ambulatory Visit | Attending: Radiation Oncology | Admitting: Radiation Oncology

## 2022-02-02 ENCOUNTER — Encounter: Payer: Self-pay | Admitting: Urology

## 2022-02-02 ENCOUNTER — Ambulatory Visit
Admission: RE | Admit: 2022-02-02 | Discharge: 2022-02-02 | Disposition: A | Discharge status: Home / Self Care | Payer: PPO | Source: Ambulatory Visit | Attending: Urology | Admitting: Urology

## 2022-02-02 VITALS — BP 111/67 | HR 85 | Temp 97.9°F | Resp 20 | Ht 70.0 in | Wt 131.2 lb

## 2022-02-02 DIAGNOSIS — Z79899 Other long term (current) drug therapy: Secondary | ICD-10-CM | POA: Insufficient documentation

## 2022-02-02 DIAGNOSIS — C61 Malignant neoplasm of prostate: Secondary | ICD-10-CM | POA: Insufficient documentation

## 2022-02-02 DIAGNOSIS — Z923 Personal history of irradiation: Secondary | ICD-10-CM | POA: Insufficient documentation

## 2022-02-02 DIAGNOSIS — R350 Frequency of micturition: Secondary | ICD-10-CM | POA: Insufficient documentation

## 2022-02-02 NOTE — Progress Notes (Signed)
Pose seed appointment for patient w/ stage T1c adenocarcinoma of the prostate with a Gleason's score of 4+3 and a PSA of 5.3.  I verified patient's identity and began nursing interview. Patient reports dysuria 5/10, nocturia x10 on overage, diaper rash, rectal irritation 7/10, constipation, nausea, and incontinence. No other issues reported at this time.  Meaningful use complete. I-PSS score of 22-severe Patient has stopped taking AZO due to possible nausea. No other urinary management medications. Urology appt- Oct 16th, 2023 at [facility] w/ Dr. [name].  BP 111/67 (BP Location: Left Arm, Patient Position: Sitting, Cuff Size: Normal)   Pulse 85   Temp 97.9 F (36.6 C) (Oral)   Resp 20   Ht '5\' 10"'$  (1.778 m)   Wt 131 lb 3.2 oz (59.5 kg)   SpO2 99%   BMI 18.83 kg/m

## 2022-02-05 ENCOUNTER — Inpatient Hospital Stay: Payer: PPO | Attending: Oncology

## 2022-02-05 ENCOUNTER — Ambulatory Visit: Payer: PPO | Admitting: Neurology

## 2022-02-05 NOTE — Progress Notes (Signed)
William [name] CSW Progress Note  Clinical Education officer, museum contacted patient by phone to discuss patient attending "Coping with Pain" counseling series.  Patient was referred by Katheren Puller, Nurse Navigator.  Patient was provided information about the sessions and agreed to participate.  He expressed some adjustment to recovering from surgery.  His first session is at 1pm on Tuesday, 10/17.    Rodman Pickle Shamieka Gullo, LCSW

## 2022-02-09 ENCOUNTER — Telehealth: Payer: Self-pay

## 2022-02-09 ENCOUNTER — Telehealth: Payer: Self-pay | Admitting: *Deleted

## 2022-02-09 NOTE — Telephone Encounter (Signed)
Returned patient's phone call, spoke with patient 

## 2022-02-09 NOTE — Telephone Encounter (Signed)
RN called patient after verification of identity updated him per  [employer], PA-C.  Patient verbalized understanding and was appreciative for the call.

## 2022-02-13 ENCOUNTER — Telehealth: Payer: Self-pay

## 2022-02-13 NOTE — Telephone Encounter (Signed)
CSW received an email from patient stating he was nauseous today and wanted to cancel the 1pm appointment today.  CSW emailed patient back and requested contact when he was available to reschedule.

## 2022-02-21 ENCOUNTER — Telehealth: Payer: Self-pay

## 2022-02-21 NOTE — Telephone Encounter (Signed)
RN spoke with William [name] about scans and why he had received information about results. Expressed per [employer], PA-C, to keep check on his myChart and to call back if haven't received in 1-2 weeks.

## 2022-02-21 NOTE — Progress Notes (Deleted)
[facility] [place] Phone: [phone] Subjective:    I'm seeing this patient by the request  of:  [name], MD  CC:   [id number]  01/03/2022 Worsening left-sided lumbar radiculopathy.  I think this is more of a flare.  Patient did have improvement.  Does seem to be worse with movement.  Can be muscular in nature.  Has had baclofen needed for breakthrough.  Toradol and Depo-Medrol given but did not want to do any other medication or try to delay within epidural with patient undergoing radio seed placement in the near future.  Discussed with patient about icing regimen and home exercises otherwise.  Follow-up with me again 6 weeks after his surgery.  Worsening pain in the back and he does need to seek medical attention immediately.  Update 02/22/2022 William [name] is a 61 y.o. male coming in with complaint of lumbar spine pain. Patient states        Past Medical History:  Diagnosis Date   Allergy    Anxiety    Chronic kidney disease    Depression    Diverticulitis    Gastroparesis    Heart murmur    Migraine headache    Mitral valve prolapse    SVT (supraventricular tachycardia)    Past Surgical History:  Procedure Laterality Date   COLONOSCOPY W/ BIOPSIES  2023   RADIOACTIVE SEED IMPLANT N/A 01/19/2022   Procedure: RADIOACTIVE SEED IMPLANT/BRACHYTHERAPY IMPLANT;  Surgeon: [name], MD;  Location: [facility];  Service: Urology;  Laterality: N/A;   SPACE OAR INSTILLATION N/A 01/19/2022   Procedure: SPACE OAR INSTILLATION;  Surgeon: [name], MD;  Location: [facility];  Service: Urology;  Laterality: N/A;   Social History   Socioeconomic History   Marital status: Married    Spouse name: Not on file   Number of children: 0   Years of education: Not on file   Highest education level: Bachelor's degree (e.g., BA, AB, BS)  Occupational History   Not on file   Tobacco Use   Smoking status: Never   Smokeless tobacco: Never  Vaping Use   Vaping Use: Never used  Substance and Sexual Activity   Alcohol use: No    Alcohol/week: 0.0 standard drinks of alcohol   Drug use: No   Sexual activity: Yes    Partners: Female  Other Topics Concern   Not on file  Social History Narrative   Pt is married lives with spouse in 1 story apartment he has No children   BS degree- he is right handed   Drinks soda  Ginger ale, no coffee no tea   Right handed   Social Determinants of Health   Financial Resource Strain: Low Risk  (10/25/2021)   Overall Financial Resource Strain (CARDIA)    Difficulty of Paying Living Expenses: Not hard at all  Food Insecurity: No Food Insecurity (10/25/2021)   Hunger Vital Sign    Worried About Running Out of Food in the Last Year: Never true    Ran Out of Food in the Last Year: Never true  Transportation Needs: No Transportation Needs (10/25/2021)   PRAPARE - Hydrologist (Medical): No    Lack of Transportation (Non-Medical): No  Physical Activity: Not on file  Stress: Stress Concern Present (10/25/2021)   [place]    Feeling of Stress : Rather much  Social Connections: Not on file   Allergies  Allergen Reactions   Erythromycin Anaphylaxis and Nausea And Vomiting   Nalbuphine Other (See Comments)    States loss of consciousness    Cefdinir Diarrhea, Nausea And Vomiting and Rash   Cephalexin Diarrhea and Nausea And Vomiting   Cephalosporins Diarrhea and Nausea And Vomiting   Hydrocodone-Acetaminophen Nausea And Vomiting   Ketorolac Nausea And Vomiting    Other reaction(s): upset stomach   Prochlorperazine Edisylate Other (See Comments)    uncontrollable shake - tremor    Restasis [Cyclosporine] Other (See Comments)    migraines   Tyloxapol Swelling   Bacitracin-Polymyxin B     Other reaction(s): rash gets worse Other  reaction(s): rash gets worse   Codeine Other (See Comments)    Other reaction(s): stomach upset   Nalbuphine Hcl     Other reaction(s): stomach upset   Prochlorperazine     Other reaction(s): tremors/vomiting   Propranolol Diarrhea   Topamax [Topiramate] Diarrhea and Nausea And Vomiting   Zetia [Ezetimibe] Diarrhea   Cefdinir Diarrhea, Nausea And Vomiting and Rash    Other reaction(s): too stronge   Doxycycline Hyclate Nausea Only and Other (See Comments)    abd pain   Iodinated Contrast Media Other (See Comments)    Lightheaded, flushed feeling (explained to patient these are normal side effects of IV iodinated contrast, not allergies, but he wanted this noted in the epic; he tolerated epidural steroid injections w/ contrast w/o difficulty)   Ketoconazole Itching   Ketorolac Tromethamine Rash    REACTION: Reaction not known   Latex Itching and Rash   Neosporin [Neomycin-Bacitracin Zn-Polymyx] Rash   Oxycodone-Acetaminophen Other (See Comments)    Tylox caused constipation   Prednisone Nausea And Vomiting    Oral route, only Other reaction(s): stomach spasms   Family History  Problem Relation Age of Onset   Hypertension Mother    Lung cancer Father 15 - 92   Hypertension Brother    Kidney failure Maternal Grandmother    Prostate cancer Neg Hx    Kidney cancer Neg Hx      Current Outpatient Medications (Cardiovascular):    diltiazem (CARDIZEM CD) 120 MG 24 hr capsule, Take 1 capsule (120 mg total) by mouth daily.   diltiazem (CARDIZEM) 30 MG tablet, TAKE (1) TABLET AS NEEDED FOR PALPITATIONS  Current Outpatient Medications (Respiratory):    fluticasone (FLONASE) 50 MCG/ACT nasal spray, Place 2 sprays into both nostrils daily.  Current Outpatient Medications (Analgesics):    EMGALITY 120 MG/ML SOAJ, as directed.   HYDROmorphone (DILAUDID) 2 MG tablet, Take 1 tablet (2 mg total) by mouth every 4 (four) hours as needed for moderate pain.   Rimegepant Sulfate (NURTEC) 75 MG  TBDP, Take 75 mg by mouth as needed for up to 1 dose (Maximum 1 tablet in 24 hours.).   Current Outpatient Medications (Other):    baclofen (LIORESAL) 10 MG tablet, Take 0.5 tablets (5 mg total) by mouth at bedtime as needed for muscle spasms.   metoCLOPramide (REGLAN) 5 MG tablet, Take 5 mg by mouth as needed.   ondansetron (ZOFRAN-ODT) 8 MG disintegrating tablet, Take 8 mg by mouth 3 (three) times daily as needed.   phenazopyridine (PYRIDIUM) 95 MG tablet, Take 95 mg by mouth 3 (three) times daily as needed for pain.   Reviewed prior external information including notes and imaging from  primary care provider As well as notes that were available from care everywhere and other healthcare systems.  Past medical history, social, surgical and family history all reviewed in electronic medical record.  No pertanent information unless stated regarding to the chief complaint.   Review of Systems:  No headache, visual changes, nausea, vomiting, diarrhea, constipation, dizziness, abdominal pain, skin rash, fevers, chills, night sweats, weight loss, swollen lymph nodes, body aches, joint swelling, chest pain, shortness of breath, mood changes. POSITIVE muscle aches  Objective  There were no vitals taken for this visit.   General: No apparent distress alert and oriented x3 mood and affect normal, dressed appropriately.  HEENT: Pupils equal, extraocular movements intact  Respiratory: Patient's speak in full sentences and does not appear short of breath  Cardiovascular: No lower extremity edema, non tender, no erythema      Impression and Recommendations:

## 2022-02-22 ENCOUNTER — Ambulatory Visit: Payer: PPO | Admitting: Family Medicine

## 2022-02-22 ENCOUNTER — Telehealth: Payer: Self-pay

## 2022-02-22 ENCOUNTER — Encounter: Payer: Self-pay | Admitting: Radiation Oncology

## 2022-02-22 DIAGNOSIS — C61 Malignant neoplasm of prostate: Secondary | ICD-10-CM | POA: Diagnosis present

## 2022-02-22 NOTE — Telephone Encounter (Signed)
Per Freeman Caldron, PA-C this writer called patient to inform him that dosimetry results are now in his myChart.

## 2022-02-22 NOTE — Progress Notes (Signed)
  Radiation Oncology         (336) [phone] ________________________________  Name: William [name] MRN: [id number]  Date: 02/22/2022  DOB: Dec 01, 1960  3D Planning Note   Prostate Brachytherapy Post-Implant Dosimetry  Diagnosis: 61 y.o. gentleman with stage T1c adenocarcinoma of the prostate with a Gleason's score of 4+3 and a PSA of 5.3.  Narrative: On a previous date, William [name] returned following prostate seed implantation for post implant planning. He underwent CT scan complex simulation to delineate the three-dimensional structures of the pelvis and demonstrate the radiation distribution.  Since that time, the seed localization, and complex isodose planning with dose volume histograms have now been completed.  Results:   Prostate Coverage - The dose of radiation delivered to the 90% or more of the prostate gland (D90) was 110% of the prescription dose. This exceeds our goal of greater than 90%. Rectal Sparing - The volume of rectal tissue receiving the prescription dose or higher was 0.0 cc. This falls under our thresholds tolerance of 1.0 cc.  Impression: The prostate seed implant appears to show adequate target coverage and appropriate rectal sparing.  Plan:  The patient will continue to follow with urology for ongoing PSA determinations. I would anticipate a high likelihood for local tumor control with minimal risk for rectal morbidity.  ________________________________  Sheral Apley Tammi Klippel, M.D.

## 2022-03-05 NOTE — Progress Notes (Deleted)
[place] Phone: [phone] Subjective:    I'm seeing this patient by the request  of:  [name], MD  CC:   [id number]  01/03/2022 Worsening left-sided lumbar radiculopathy.  I think this is more of a flare.  Patient did have improvement.  Does seem to be worse with movement.  Can be muscular in nature.  Has had baclofen needed for breakthrough.  Toradol and Depo-Medrol given but did not want to do any other medication or try to delay within epidural with patient undergoing radio seed placement in the near future.  Discussed with patient about icing regimen and home exercises otherwise.  Follow-up with me again 6 weeks after his surgery.  Worsening pain in the back and he does need to seek medical attention immediately.     Update 03/07/2022 William [name] is a 61 y.o. male coming in with complaint of lumbar spine pain. Patient states        Past Medical History:  Diagnosis Date   Allergy    Anxiety    Chronic kidney disease    Depression    Diverticulitis    Gastroparesis    Heart murmur    Migraine headache    Mitral valve prolapse    SVT (supraventricular tachycardia)    Past Surgical History:  Procedure Laterality Date   COLONOSCOPY W/ BIOPSIES  2023   RADIOACTIVE SEED IMPLANT N/A 01/19/2022   Procedure: RADIOACTIVE SEED IMPLANT/BRACHYTHERAPY IMPLANT;  Surgeon: [name], MD;  Location: [facility];  Service: Urology;  Laterality: N/A;   SPACE OAR INSTILLATION N/A 01/19/2022   Procedure: SPACE OAR INSTILLATION;  Surgeon: [name], MD;  Location: [facility];  Service: Urology;  Laterality: N/A;   Social History   Socioeconomic History   Marital status: Married    Spouse name: Not on file   Number of children: 0   Years of education: Not on file   Highest education level: Bachelor's degree (e.g., BA, AB, BS)  Occupational History   Not on file   Tobacco Use   Smoking status: Never   Smokeless tobacco: Never  Vaping Use   Vaping Use: Never used  Substance and Sexual Activity   Alcohol use: No    Alcohol/week: 0.0 standard drinks of alcohol   Drug use: No   Sexual activity: Yes    Partners: Female  Other Topics Concern   Not on file  Social History Narrative   Pt is married lives with spouse in 1 story apartment he has No children   BS degree- he is right handed   Drinks soda  Ginger ale, no coffee no tea   Right handed   Social Determinants of Health   Financial Resource Strain: Low Risk  (10/25/2021)   Overall Financial Resource Strain (CARDIA)    Difficulty of Paying Living Expenses: Not hard at all  Food Insecurity: No Food Insecurity (10/25/2021)   Hunger Vital Sign    Worried About Running Out of Food in the Last Year: Never true    Ran Out of Food in the Last Year: Never true  Transportation Needs: No Transportation Needs (10/25/2021)   PRAPARE - Hydrologist (Medical): No    Lack of Transportation (Non-Medical): No  Physical Activity: Not on file  Stress: Stress Concern Present (10/25/2021)   [place]    Feeling of Stress :  Rather much  Social Connections: Not on file   Allergies  Allergen Reactions   Erythromycin Anaphylaxis and Nausea And Vomiting   Nalbuphine Other (See Comments)    States loss of consciousness    Cefdinir Diarrhea, Nausea And Vomiting and Rash   Cephalexin Diarrhea and Nausea And Vomiting   Cephalosporins Diarrhea and Nausea And Vomiting   Hydrocodone-Acetaminophen Nausea And Vomiting   Ketorolac Nausea And Vomiting    Other reaction(s): upset stomach   Prochlorperazine Edisylate Other (See Comments)    uncontrollable shake - tremor    Restasis [Cyclosporine] Other (See Comments)    migraines   Tyloxapol Swelling   Bacitracin-Polymyxin B     Other reaction(s): rash gets worse Other  reaction(s): rash gets worse   Codeine Other (See Comments)    Other reaction(s): stomach upset   Nalbuphine Hcl     Other reaction(s): stomach upset   Prochlorperazine     Other reaction(s): tremors/vomiting   Propranolol Diarrhea   Topamax [Topiramate] Diarrhea and Nausea And Vomiting   Zetia [Ezetimibe] Diarrhea   Cefdinir Diarrhea, Nausea And Vomiting and Rash    Other reaction(s): too stronge   Doxycycline Hyclate Nausea Only and Other (See Comments)    abd pain   Iodinated Contrast Media Other (See Comments)    Lightheaded, flushed feeling (explained to patient these are normal side effects of IV iodinated contrast, not allergies, but he wanted this noted in the epic; he tolerated epidural steroid injections w/ contrast w/o difficulty)   Ketoconazole Itching   Ketorolac Tromethamine Rash    REACTION: Reaction not known   Latex Itching and Rash   Neosporin [Neomycin-Bacitracin Zn-Polymyx] Rash   Oxycodone-Acetaminophen Other (See Comments)    Tylox caused constipation   Prednisone Nausea And Vomiting    Oral route, only Other reaction(s): stomach spasms   Family History  Problem Relation Age of Onset   Hypertension Mother    Lung cancer Father 16 - 90   Hypertension Brother    Kidney failure Maternal Grandmother    Prostate cancer Neg Hx    Kidney cancer Neg Hx      Current Outpatient Medications (Cardiovascular):    diltiazem (CARDIZEM CD) 120 MG 24 hr capsule, Take 1 capsule (120 mg total) by mouth daily.   diltiazem (CARDIZEM) 30 MG tablet, TAKE (1) TABLET AS NEEDED FOR PALPITATIONS  Current Outpatient Medications (Respiratory):    fluticasone (FLONASE) 50 MCG/ACT nasal spray, Place 2 sprays into both nostrils daily.  Current Outpatient Medications (Analgesics):    EMGALITY 120 MG/ML SOAJ, as directed.   HYDROmorphone (DILAUDID) 2 MG tablet, Take 1 tablet (2 mg total) by mouth every 4 (four) hours as needed for moderate pain.   Rimegepant Sulfate (NURTEC) 75 MG  TBDP, Take 75 mg by mouth as needed for up to 1 dose (Maximum 1 tablet in 24 hours.).   Current Outpatient Medications (Other):    baclofen (LIORESAL) 10 MG tablet, Take 0.5 tablets (5 mg total) by mouth at bedtime as needed for muscle spasms.   metoCLOPramide (REGLAN) 5 MG tablet, Take 5 mg by mouth as needed.   ondansetron (ZOFRAN-ODT) 8 MG disintegrating tablet, Take 8 mg by mouth 3 (three) times daily as needed.   phenazopyridine (PYRIDIUM) 95 MG tablet, Take 95 mg by mouth 3 (three) times daily as needed for pain.   Reviewed prior external information including notes and imaging from  primary care provider As well as notes that were available from care everywhere and other  healthcare systems.  Past medical history, social, surgical and family history all reviewed in electronic medical record.  No pertanent information unless stated regarding to the chief complaint.   Review of Systems:  No headache, visual changes, nausea, vomiting, diarrhea, constipation, dizziness, abdominal pain, skin rash, fevers, chills, night sweats, weight loss, swollen lymph nodes, body aches, joint swelling, chest pain, shortness of breath, mood changes. POSITIVE muscle aches  Objective  There were no vitals taken for this visit.   General: No apparent distress alert and oriented x3 mood and affect normal, dressed appropriately.  HEENT: Pupils equal, extraocular movements intact  Respiratory: Patient's speak in full sentences and does not appear short of breath  Cardiovascular: No lower extremity edema, non tender, no erythema      Impression and Recommendations:

## 2022-03-06 ENCOUNTER — Encounter: Payer: Self-pay | Admitting: *Deleted

## 2022-03-07 ENCOUNTER — Ambulatory Visit: Payer: PPO | Admitting: Family Medicine

## 2022-03-16 ENCOUNTER — Inpatient Hospital Stay: Payer: PPO | Attending: Oncology | Admitting: *Deleted

## 2022-03-16 DIAGNOSIS — C61 Malignant neoplasm of prostate: Secondary | ICD-10-CM

## 2022-03-20 NOTE — Progress Notes (Signed)
2 Identifiers used for verification purposes only. No vitals taken as this was a telephone visit. Pt denies pain today. Pt states that he is doing much better with the urinary frequency and urgency. He also said getting up to bathroom at night has decreased. Pt is very anxious about what his PSA will be. I tried to reassure him to wait patiently and let the seeds do what they do. He seemed more relaxed about it as I explained to him how the treatment he received works. I discussed the benefits of diet and exercise. Currently he doesn't have a routine of regular exercise. Pt denies smoking and drinking. 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 I suggested the caregiver's support group for him and mailed out information. Pt will see PCP soon and said he will get the flu vaccine. Vaccine reviewed and updated. Last colonoscopy was 12/13/2005. SCP reviewed and completed.

## 2022-03-27 NOTE — Progress Notes (Unsigned)
No chief complaint on file.   History of Present Illness: 61 yo male with history of palpitations, PVCs, PACs, SVT, migraine headaches, mitral valve prolapse and irritable bowel syndrome who is here today for cardiac follow up. He is known to have PVCs and SVT. He has not tolerated beta blockers. He had a normal echo and stress test in 2011.  Echo in 2014 with normal LV size and function. 48 hour monitor in 2014 with normal sinus rhythm and rare PACs. He was seen November 2018 in our office by Estella Husk, PA and had c/o palpitations. Cardiac event monitor November 2018 with sinus rhythm and one run of SVT (40 beats).  He was started on propranolol for his SVT by primary care but it caused diarrhea. I restarted his Cardizem CD when I saw him in February 2019. He was seen in our office October 2021 and reported more episodes of SVT. Cardiac monitor November 2021 with several short runs of SVT. He was seen in the EP clinic in November 2021 by Dr. Lennie Odor and he elected to continue Cardizem rather than move toward an ablation. I saw him in march 2023 and he had stopped taking Cardizem CD 180 mg daily due to some dizziness. He had been taking the short acting Diltiazem as needed and reported palpitations several days per week. Echo march 2023 with LVEF=70-75%. Trivial MR. Cardiac monitor April 2023 with 4 runs of SVT with longest at 23 minutes. He was seen by Dr. Lennie Odor 09/28/21 and plans were to continue medical therapy with Cardizem alone given recent diagnosis of prostate cancer.   He is here today for follow up. The patient denies any chest pain, dyspnea, palpitations, lower extremity edema, orthopnea, PND, dizziness, near syncope or syncope.   Primary Care Physician: [name], MD  Past Medical History:  Diagnosis Date   Allergy    Anxiety    Chronic kidney disease    Depression    Diverticulitis    Gastroparesis    Heart murmur    Migraine headache    Mitral valve prolapse    SVT  (supraventricular tachycardia)    Past Surgical History:  Procedure Laterality Date   COLONOSCOPY W/ BIOPSIES  2023   RADIOACTIVE SEED IMPLANT N/A 01/19/2022   Procedure: RADIOACTIVE SEED IMPLANT/BRACHYTHERAPY IMPLANT;  Surgeon: [name], MD;  Location: [facility];  Service: Urology;  Laterality: N/A;   SPACE OAR INSTILLATION N/A 01/19/2022   Procedure: SPACE OAR INSTILLATION;  Surgeon: [name], MD;  Location: [facility];  Service: Urology;  Laterality: N/A;    Current Outpatient Medications  Medication Sig Dispense Refill   albuterol (VENTOLIN HFA) 108 (90 Base) MCG/ACT inhaler Inhale 2 puffs into the lungs every 6 (six) hours as needed for wheezing or shortness of breath. (Patient not taking: Reported on 03/16/2022)     baclofen (LIORESAL) 10 MG tablet Take 0.5 tablets (5 mg total) by mouth at bedtime as needed for muscle spasms. (Patient not taking: Reported on 03/16/2022) 30 each 0   benzonatate (TESSALON) 200 MG capsule Take 200 mg by mouth 3 (three) times daily.     diltiazem (CARDIZEM CD) 120 MG 24 hr capsule Take 1 capsule (120 mg total) by mouth daily. 90 capsule 3   diltiazem (CARDIZEM) 30 MG tablet TAKE (1) TABLET AS NEEDED FOR PALPITATIONS (Patient not taking: Reported on 03/16/2022) 30 tablet 11   EMGALITY 120 MG/ML SOAJ 120 mg as directed.  Takes once a month.  fluticasone (FLONASE) 50 MCG/ACT nasal spray Place 2 sprays into both nostrils daily. 16 g 6   hydrOXYzine (ATARAX) 50 MG tablet Take 25-50 mg by mouth 2 (two) times daily as needed for itching. (Patient not taking: Reported on 03/16/2022)     metoCLOPramide (REGLAN) 5 MG tablet Take 5 mg by mouth as needed. (Patient not taking: Reported on 03/16/2022)     ondansetron (ZOFRAN-ODT) 8 MG disintegrating tablet Take 8 mg by mouth 3 (three) times daily as needed.     polyethylene glycol powder (GLYCOLAX/MIRALAX) 17 GM/SCOOP powder Take 17 g by mouth as needed for moderate constipation.  (Patient not taking: Reported on 03/16/2022)     Rimegepant Sulfate (NURTEC) 75 MG TBDP Take 75 mg by mouth as needed for up to 1 dose (Maximum 1 tablet in 24 hours.). (Patient not taking: Reported on 03/16/2022) 8 tablet 5   No current facility-administered medications for this visit.    Allergies  Allergen Reactions   Erythromycin Anaphylaxis and Nausea And Vomiting   Nalbuphine Other (See Comments)    States loss of consciousness    Cefdinir Diarrhea, Nausea And Vomiting and Rash   Cephalexin Diarrhea and Nausea And Vomiting   Cephalosporins Diarrhea and Nausea And Vomiting   Hydrocodone-Acetaminophen Nausea And Vomiting   Ketorolac Nausea And Vomiting    Other reaction(s): upset stomach   Prochlorperazine Edisylate Other (See Comments)    uncontrollable shake - tremor    Restasis [Cyclosporine] Other (See Comments)    migraines   Tyloxapol Swelling   Bacitracin-Polymyxin B     Other reaction(s): rash gets worse Other reaction(s): rash gets worse   Codeine Other (See Comments)    Other reaction(s): stomach upset   Nalbuphine Hcl     Other reaction(s): stomach upset   Prochlorperazine     Other reaction(s): tremors/vomiting   Propranolol Diarrhea   Topamax [Topiramate] Diarrhea and Nausea And Vomiting   Zetia [Ezetimibe] Diarrhea   Cefdinir Diarrhea, Nausea And Vomiting and Rash    Other reaction(s): too stronge   Doxycycline Hyclate Nausea Only and Other (See Comments)    abd pain   Iodinated Contrast Media Other (See Comments)    Lightheaded, flushed feeling (explained to patient these are normal side effects of IV iodinated contrast, not allergies, but he wanted this noted in the epic; he tolerated epidural steroid injections w/ contrast w/o difficulty)   Ketoconazole Itching   Ketorolac Tromethamine Rash    REACTION: Reaction not known   Latex Itching and Rash   Neosporin [Neomycin-Bacitracin Zn-Polymyx] Rash   Oxycodone-Acetaminophen Other (See Comments)     Tylox caused constipation   Prednisone Nausea And Vomiting    Oral route, only Other reaction(s): stomach spasms    Social History   Socioeconomic History   Marital status: Married    Spouse name: Not on file   Number of children: 0   Years of education: Not on file   Highest education level: Bachelor's degree (e.g., BA, AB, BS)  Occupational History   Not on file  Tobacco Use   Smoking status: Never   Smokeless tobacco: Never  Vaping Use   Vaping Use: Never used  Substance and Sexual Activity   Alcohol use: No    Alcohol/week: 0.0 standard drinks of alcohol   Drug use: No   Sexual activity: Yes    Partners: Female  Other Topics Concern   Not on file  Social History Narrative   Pt is married lives with spouse in 50  story apartment he has No children   BS degree- he is right handed   Drinks soda  Ginger ale, no coffee no tea   Right handed   Social Determinants of Health   Financial Resource Strain: Low Risk  (10/25/2021)   Overall Financial Resource Strain (CARDIA)    Difficulty of Paying Living Expenses: Not hard at all  Food Insecurity: No Food Insecurity (10/25/2021)   Hunger Vital Sign    Worried About Running Out of Food in the Last Year: Never true    Ran Out of Food in the Last Year: Never true  Transportation Needs: No Transportation Needs (10/25/2021)   PRAPARE - Hydrologist (Medical): No    Lack of Transportation (Non-Medical): No  Physical Activity: Not on file  Stress: Stress Concern Present (10/25/2021)   [place]    Feeling of Stress : Rather much  Social Connections: Not on file  Intimate Partner Violence: Not At Risk (02/05/2022)   Humiliation, Afraid, Rape, and Kick questionnaire    Fear of Current or Ex-Partner: No    Emotionally Abused: No    Physically Abused: No    Sexually Abused: No    Family History  Problem Relation Age of Onset    Hypertension Mother    Lung cancer Father 75 - 52   Hypertension Brother    Kidney failure Maternal Grandmother    Prostate cancer Neg Hx    Kidney cancer Neg Hx     Review of Systems:  As stated in the HPI and otherwise negative.   There were no vitals taken for this visit.  Physical Examination: General: Well developed, well nourished, NAD  HEENT: OP clear, mucus membranes moist  SKIN: warm, dry. No rashes. Neuro: No focal deficits  Musculoskeletal: Muscle strength 5/5 all ext  Psychiatric: Mood and affect normal  Neck: No JVD, no carotid bruits, no thyromegaly, no lymphadenopathy.  Lungs:Clear bilaterally, no wheezes, rhonci, crackles Cardiovascular: Regular rate and rhythm. No murmurs, gallops or rubs. Abdomen:Soft. Bowel sounds present. Non-tender.  Extremities: No lower extremity edema. Pulses are 2 + in the bilateral DP/PT.   Echo 07/19/21:  1. Left ventricular ejection fraction, by estimation, is 70 to 75%. Left  ventricular ejection fraction by 3D volume is 70 %. The left ventricle has  hyperdynamic function. The left ventricle has no regional wall motion  abnormalities. Left ventricular  diastolic parameters are consistent with Grade I diastolic dysfunction  (impaired relaxation). The average left ventricular global longitudinal  strain is 23.5 %. The global longitudinal strain is normal.   2. Right ventricular systolic function is normal. The right ventricular  size is normal. There is normal pulmonary artery systolic pressure. The  estimated right ventricular systolic pressure is 73.2 mmHg.   3. No diagnostic prolapse. The mitral valve leaflets are mildly  thickened. Trivial mitral valve regurgitation.   4. The aortic valve is tricuspid. Aortic valve regurgitation is not  visualized.   5. The inferior vena cava is normal in size with greater than 50%  respiratory variability, suggesting right atrial pressure of 3 mmHg.    EKG:  EKG is ***  ordered today. The ekg  ordered today demonstrates   Recent Labs: No results found for requested labs within last 365 days.   Lipid Panel    Component Value Date/Time   CHOL 233 (H) 12/29/2016 1036   TRIG 157 (H) 12/29/2016 1036   HDL 52  12/29/2016 1036   CHOLHDL 4.5 12/29/2016 1036   CHOLHDL 3.7 05/14/2015 0958   VLDL 25 05/14/2015 0958   LDLCALC 150 (H) 12/29/2016 1036     Wt Readings from Last 3 Encounters:  02/02/22 131 lb 3.2 oz (59.5 kg)  01/19/22 135 lb 12.8 oz (61.6 kg)  01/03/22 138 lb (62.6 kg)    Assessment and Plan:   1. SVT:   He is known to have SVT. Echo March 2023 with LVEF=70-75%. Trivial MR. Cardiac monitor April 2023 with 4 runs of SVT with longest at 23 minutes. He was seen by Dr. Lennie Odor 09/28/21 and plans were to continue medical therapy with Cardizem alone given recent diagnosis of prostate cancer. Continue Cardizem CD 120 mg daily with short acting Cardizem for episodes of SVT. If he has long runs of symptomatic SVT, will need consideration for SVT ablation.    Labs/ tests ordered today include:  No orders of the defined types were placed in this encounter.  Disposition:   F/U with me or office APP in 12 months.    Signed, Lauree Chandler, MD 03/27/2022 9:25 PM    [facility] [place], [place],   [address] Phone: [phone]; Fax: [phone]

## 2022-03-28 ENCOUNTER — Ambulatory Visit: Payer: PPO | Attending: Cardiovascular Disease | Admitting: Cardiovascular Disease

## 2022-03-28 ENCOUNTER — Encounter: Payer: Self-pay | Admitting: Cardiovascular Disease

## 2022-03-28 VITALS — BP 120/68 | HR 84 | Ht 70.0 in | Wt 136.4 lb

## 2022-03-28 DIAGNOSIS — I471 Supraventricular tachycardia, unspecified: Secondary | ICD-10-CM

## 2022-03-28 NOTE — Patient Instructions (Signed)
Medication Instructions:  No changes *If you need a refill on your cardiac medications before your next appointment, please call your pharmacy*   Lab Work: none  Testing/Procedures: none   Follow-Up: At [facility], you and your health needs are our priority.  As part of our continuing mission to provide you with exceptional heart care, we have created designated Provider Care Teams.  These Care Teams include your primary Cardiologist (physician) and Advanced Practice Providers (APPs -  Physician Assistants and Nurse Practitioners) who all work together to provide you with the care you need, when you need it.   Your next appointment:   12 month(s)  The format for your next appointment:   In Person  Provider:   Christopher McAlhany, MD   Important Information About Sugar       

## 2022-04-03 NOTE — Progress Notes (Signed)
NEUROLOGY FOLLOW UP OFFICE NOTE  William [name] [id number]  Assessment/Plan:   1.  Migraine without aura, without status migrainosus, not intractable - increased frequency. 2.  Essential tremor - very mild   1.  Migraine prevention:  Emgality 2.  Migraine rescue:  Nurtec and Zofran 3.  Limit use of pain relievers to no more than 2 days out of week to prevent risk of rebound or medication-overuse headache. 4.  Keep headache diary 5.  Monitor tremor 6.  Follow up one year   Subjective:  William [name] is a 61 year old right-handed male with hyperlipidemia, MVP, IBS, gastroparesis, generalized anxiety disorder and palpitations who follows up for migraines and tremor.   UPDATE:  Intensity:  Moderate to severe Duration:  Less than 15 minutes with Nurtec Frequency: 1 to 2 a month  Frequency of abortive medication: 0 days in past 30 days   He has had increased stress recently, which caused increased migraines for a short while.  He was diagnosed with prostate cancer.  He underwent radioactive seed implant in September.  Migraines have normalized.  The stress also exacerbated his right hand tremor a bit.  Nothing severe.   Current NSAIDS:  none Current analgesics:  None Current triptans:  Contraindicated (MVP) Current ergotamine:  none Current anti-emetic:  Zofran '4mg'$  Current muscle relaxants:  baclofen '10mg'$  TID Current anti-anxiolytic:  none Current sleep aide:  none Current Antihypertensive medications:  Cardizem Current Antidepressant medications:  none Current Anticonvulsant medications:  none Current anti-CGRP: Emgality; Nurtec Current Vitamins/Herbal/Supplements:  D Current Antihistamines/Decongestants:  Claritin, Flonase Other therapy:  none   Caffeine:  No coffee, tea or caffeinated soda Diet:  Needs to improve.  Sometimes ginger ale Exercise:  No Depression:  no; Anxiety:  Yes.  stress related to prostate cancer diagnosis Other pain:  no Sleep hygiene:   Poor.  Stress related to caregiver for his mother   HISTORY: Migraines:  Onset: 1986.   Location: 90% of time: Left retro-orbital region, 10% bi-frontal Quality:  stabbing Initial intensity: Fluctuates from 4/10-10/10 Aura:  no Prodrome:  no Associated symptoms: Lightheadedness, fatigue, nausea, tearing of left eye. Initial duration: 4 hours Initial frequency: 3 to 4 times a week Triggers/aggravating factors: Afrin, odors (perfumes, cigarette, chinese food), MSG, salt, stress, extreme temperatures Relieving factors: Water Activity:  About 4 to 5 days a week, cannot function  Tremor: He also reports longstanding history of tremor.  It involves the left hand.  It occurs with use, not at rest.  Over the past 10 years, it has gotten a little worse.  He does report increased emotional stress and poor sleep.  No problems with walking.  No family history of tremor.    Past NSAIDS: Indomethacin Past analgesics: Nucynta, lidocaine patches, tramadol, nalbuphine, Tylox, bupivacaine injections, hydrocodone, morphine, Tylenol #2, Tylenol Past abortive triptans:  Sumatriptan po/[place], Maxalt, Relpax, Amerge, Zomig Past abortive ergotamine:  Migranal, DHE protocol Past muscle relaxants:  Robaxin, Flexeril Past anti-emetic:  Reglan, Phenergan Past anxiolytics:  Temazeam, clonazepam Past antihypertensive medications:  Inderal, metoprolol, verapamil Past antidepressant medications:  Nortriptyline, amitriptyline, venlafaxine, Wellbutrin, Remeron Past anticonvulsant medications:  Depakote, topiramate (weight loss, diarrhea), Lyrica,Keppra, gabapentin Past anti-CGRP:  none Past vitamins/Herbal/Supplements:  petasites Past antihistamines/decongestants:  none Other past therapies:  Biofeedback, acupuncture, counseling     He has had multiple imaging of the head, all unremarkable, including:    CT HEAD from 06/01/09 and 07/30/09. MRI BRAIN W/WO from 12/08/02, 01/19/05, 04/18/12.  MRI of brain ordered by  me from  11/19/2015 was personally reviewed and was normal.   CERVICAL XR from 01/15/08 showed mild degenerative disc disease at C5-6.   He has seen multiple neurologists and headache and pain specialists, including Dr. Morene Antu, Dr. Asencion Partridge Dohmeieir, Dr. Earley Favor, Dr. Melton Alar, Dr. Rock Nephew at [facility] and he was seen at the [facility].     He also has history of some obsessive compulsive symptoms since he was young.  For example, he repeatedly washes his hands and always uses a new cup during the day when he drinks something.  He has history of low blood pressure and has been evaluated by cardiology for palpitations.  Work up was unremarkable.  PAST MEDICAL HISTORY: Past Medical History:  Diagnosis Date   Allergy    Anxiety    Chronic kidney disease    Depression    Diverticulitis    Gastroparesis    Heart murmur    Migraine headache    Mitral valve prolapse    SVT (supraventricular tachycardia)     MEDICATIONS: Current Outpatient Medications on File Prior to Visit  Medication Sig Dispense Refill   albuterol (VENTOLIN HFA) 108 (90 Base) MCG/ACT inhaler Inhale 2 puffs into the lungs every 6 (six) hours as needed for wheezing or shortness of breath.     baclofen (LIORESAL) 10 MG tablet Take 0.5 tablets (5 mg total) by mouth at bedtime as needed for muscle spasms. 30 each 0   benzonatate (TESSALON) 200 MG capsule Take 200 mg by mouth 3 (three) times daily.     diltiazem (CARDIZEM CD) 120 MG 24 hr capsule Take 1 capsule (120 mg total) by mouth daily. 90 capsule 3   diltiazem (CARDIZEM) 30 MG tablet TAKE (1) TABLET AS NEEDED FOR PALPITATIONS 30 tablet 11   EMGALITY 120 MG/ML SOAJ 120 mg as directed.  Takes once a month.     fluticasone (FLONASE) 50 MCG/ACT nasal spray Place 2 sprays into both nostrils daily. 16 g 6   hydrOXYzine (ATARAX) 50 MG tablet Take 25-50 mg by mouth 2 (two) times daily as needed for itching.     metoCLOPramide (REGLAN) 5 MG tablet Take 5 mg by mouth as needed.      ondansetron (ZOFRAN-ODT) 8 MG disintegrating tablet Take 8 mg by mouth 3 (three) times daily as needed.     polyethylene glycol powder (GLYCOLAX/MIRALAX) 17 GM/SCOOP powder Take 17 g by mouth as needed for moderate constipation.     Rimegepant Sulfate (NURTEC) 75 MG TBDP Take 75 mg by mouth as needed for up to 1 dose (Maximum 1 tablet in 24 hours.). 8 tablet 5   No current facility-administered medications on file prior to visit.    ALLERGIES: Allergies  Allergen Reactions   Erythromycin Anaphylaxis and Nausea And Vomiting   Nalbuphine Other (See Comments)    States loss of consciousness    Cefdinir Diarrhea, Nausea And Vomiting and Rash   Cephalexin Diarrhea and Nausea And Vomiting   Cephalosporins Diarrhea and Nausea And Vomiting   Hydrocodone-Acetaminophen Nausea And Vomiting   Ketorolac Nausea And Vomiting    Other reaction(s): upset stomach   Prochlorperazine Edisylate Other (See Comments)    uncontrollable shake - tremor    Restasis [Cyclosporine] Other (See Comments)    migraines   Tyloxapol Swelling   Bacitracin-Polymyxin B     Other reaction(s): rash gets worse Other reaction(s): rash gets worse   Codeine Other (See Comments)    Other reaction(s): stomach upset   Nalbuphine Hcl  Other reaction(s): stomach upset   Prochlorperazine     Other reaction(s): tremors/vomiting   Propranolol Diarrhea   Topamax [Topiramate] Diarrhea and Nausea And Vomiting   Zetia [Ezetimibe] Diarrhea   Cefdinir Diarrhea, Nausea And Vomiting and Rash    Other reaction(s): too stronge   Doxycycline Hyclate Nausea Only and Other (See Comments)    abd pain   Iodinated Contrast Media Other (See Comments)    Lightheaded, flushed feeling (explained to patient these are normal side effects of IV iodinated contrast, not allergies, but he wanted this noted in the epic; he tolerated epidural steroid injections w/ contrast w/o difficulty)   Ketoconazole Itching   Ketorolac Tromethamine Rash     REACTION: Reaction not known   Latex Itching and Rash   Neosporin [Neomycin-Bacitracin Zn-Polymyx] Rash   Oxycodone-Acetaminophen Other (See Comments)    Tylox caused constipation   Prednisone Nausea And Vomiting    Oral route, only Other reaction(s): stomach spasms    FAMILY HISTORY: Family History  Problem Relation Age of Onset   Hypertension Mother    Lung cancer Father 38 - 36   Hypertension Brother    Kidney failure Maternal Grandmother    Prostate cancer Neg Hx    Kidney cancer Neg Hx       Objective:  Blood pressure 118/69, pulse 82, height '5\' 10"'$  (1.778 m), weight 133 lb 6.4 oz (60.5 kg), SpO2 99 %. General: No acute distress.  Patient appears well-groomed.   Head:  Normocephalic/atraumatic Eyes:  Fundi examined but not visualized Neck: supple, no paraspinal tenderness, full range of motion Heart:  Regular rate and rhythm Neurological Exam: alert and oriented to person, place, and time.  Speech fluent and not dysarthric, language intact.  CN II-XII intact. Bulk and tone normal, muscle strength 5/5 throughout.  Tremor not appreciated.  Sensation to light touch intact.  Deep tendon reflexes 2+ throughout.  Finger to nose testing intact.  Gait normal, Romberg negative.    Metta Clines, DO  CC: [name] Cheadle, MD

## 2022-04-04 ENCOUNTER — Ambulatory Visit: Payer: PPO | Admitting: Family Medicine

## 2022-04-06 ENCOUNTER — Encounter: Payer: Self-pay | Admitting: Neurology

## 2022-04-06 ENCOUNTER — Ambulatory Visit: Payer: PPO | Admitting: Neurology

## 2022-04-06 ENCOUNTER — Telehealth: Payer: Self-pay

## 2022-04-06 VITALS — BP 118/69 | HR 82 | Ht 70.0 in | Wt 133.4 lb

## 2022-04-06 DIAGNOSIS — G250 Essential tremor: Secondary | ICD-10-CM | POA: Diagnosis not present

## 2022-04-06 DIAGNOSIS — G43009 Migraine without aura, not intractable, without status migrainosus: Secondary | ICD-10-CM

## 2022-04-06 MED ORDER — EMGALITY 120 MG/ML ~~LOC~~ SOAJ: 120.0000 mg | SUBCUTANEOUS | 11 refills | Status: DC

## 2022-04-06 NOTE — Patient Instructions (Signed)
Refilled emgality every 28 days Monitor tremor Use Nurtec once daily as needed Follow up one year or as needed.

## 2022-04-06 NOTE — Telephone Encounter (Signed)
[employer] [employer] form filled out and handed to patient at the end of his visit. No copy made.

## 2022-04-09 ENCOUNTER — Telehealth: Payer: Self-pay | Admitting: Neurology

## 2022-04-09 ENCOUNTER — Telehealth: Payer: Self-pay

## 2022-04-09 ENCOUNTER — Other Ambulatory Visit (HOSPITAL_COMMUNITY): Payer: Self-pay

## 2022-04-09 NOTE — Telephone Encounter (Signed)
Patient Advocate Encounter   Received notification from [employer] that prior authorization is required for Emgality '120MG'$ /ML auto-injectors  Submitted: 04-09-2022 Key [id number]  Status is pending

## 2022-04-09 NOTE — Telephone Encounter (Signed)
Caller states he saw dr.jaffe and was enrolled to get the emgality injection. The pharmacy called to let him know they had a prescription but it needs a PA. He wants to know why a RX was sent to the pharmacy

## 2022-04-09 NOTE — Telephone Encounter (Signed)
Advised patient we are aware he gets his medication through the [employer].  Once he sends the forms if the foundation needs a script will send it to them.  No need to  pick up at local pharmacy.

## 2022-04-09 NOTE — Telephone Encounter (Signed)
PA started in another encounter

## 2022-04-09 NOTE — Telephone Encounter (Signed)
Patient Advocate Encounter  Prior Authorization for Emgality '120MG'$ /ML auto-injectors has been approved.    Effective: 04-09-2022 to 04-10-2023

## 2022-04-10 NOTE — Progress Notes (Deleted)
[place] Phone: [phone] Subjective:    I'm seeing this patient by the request  of:  [name], MD  CC:   [id number]  01/03/2022 Worsening left-sided lumbar radiculopathy.  I think this is more of a flare.  Patient did have improvement.  Does seem to be worse with movement.  Can be muscular in nature.  Has had baclofen needed for breakthrough.  Toradol and Depo-Medrol given but did not want to do any other medication or try to delay within epidural with patient undergoing radio seed placement in the near future.  Discussed with patient about icing regimen and home exercises otherwise.  Follow-up with me again 6 weeks after his surgery.  Worsening pain in the back and he does need to seek medical attention immediately.      Update 04/12/2022 William [name] is a 61 y.o. male coming in with complaint of lumbar spine pain. Patient states        Past Medical History:  Diagnosis Date   Allergy    Anxiety    Chronic kidney disease    Depression    Diverticulitis    Gastroparesis    Heart murmur    Migraine headache    Mitral valve prolapse    SVT (supraventricular tachycardia)    Past Surgical History:  Procedure Laterality Date   COLONOSCOPY W/ BIOPSIES  2023   RADIOACTIVE SEED IMPLANT N/A 01/19/2022   Procedure: RADIOACTIVE SEED IMPLANT/BRACHYTHERAPY IMPLANT;  Surgeon: [name], MD;  Location: [facility];  Service: Urology;  Laterality: N/A;   SPACE OAR INSTILLATION N/A 01/19/2022   Procedure: SPACE OAR INSTILLATION;  Surgeon: [name], MD;  Location: [facility];  Service: Urology;  Laterality: N/A;   Social History   Socioeconomic History   Marital status: Married    Spouse name: Not on file   Number of children: 0   Years of education: Not on file   Highest education level: Bachelor's degree (e.g., BA, AB, BS)  Occupational History   Not on  file  Tobacco Use   Smoking status: Never   Smokeless tobacco: Never  Vaping Use   Vaping Use: Never used  Substance and Sexual Activity   Alcohol use: No    Alcohol/week: 0.0 standard drinks of alcohol   Drug use: No   Sexual activity: Yes    Partners: Female  Other Topics Concern   Not on file  Social History Narrative   Pt is married lives with spouse in 1 story apartment he has No children   BS degree- he is right handed   Drinks soda  Ginger ale, no coffee no tea   Right handed   Social Determinants of Health   Financial Resource Strain: Low Risk  (10/25/2021)   Overall Financial Resource Strain (CARDIA)    Difficulty of Paying Living Expenses: Not hard at all  Food Insecurity: No Food Insecurity (10/25/2021)   Hunger Vital Sign    Worried About Running Out of Food in the Last Year: Never true    Ran Out of Food in the Last Year: Never true  Transportation Needs: No Transportation Needs (10/25/2021)   PRAPARE - Hydrologist (Medical): No    Lack of Transportation (Non-Medical): No  Physical Activity: Not on file  Stress: Stress Concern Present (10/25/2021)   [place]    Feeling of  Stress : Rather much  Social Connections: Not on file   Allergies  Allergen Reactions   Erythromycin Anaphylaxis and Nausea And Vomiting   Nalbuphine Other (See Comments)    States loss of consciousness    Cefdinir Diarrhea, Nausea And Vomiting and Rash   Cephalexin Diarrhea and Nausea And Vomiting   Cephalosporins Diarrhea and Nausea And Vomiting   Hydrocodone-Acetaminophen Nausea And Vomiting   Ketorolac Nausea And Vomiting    Other reaction(s): upset stomach   Prochlorperazine Edisylate Other (See Comments)    uncontrollable shake - tremor    Restasis [Cyclosporine] Other (See Comments)    migraines   Tyloxapol Swelling   Bacitracin-Polymyxin B     Other reaction(s): rash gets  worse Other reaction(s): rash gets worse   Codeine Other (See Comments)    Other reaction(s): stomach upset   Nalbuphine Hcl     Other reaction(s): stomach upset   Prochlorperazine     Other reaction(s): tremors/vomiting   Propranolol Diarrhea   Topamax [Topiramate] Diarrhea and Nausea And Vomiting   Zetia [Ezetimibe] Diarrhea   Cefdinir Diarrhea, Nausea And Vomiting and Rash    Other reaction(s): too stronge   Doxycycline Hyclate Nausea Only and Other (See Comments)    abd pain   Iodinated Contrast Media Other (See Comments)    Lightheaded, flushed feeling (explained to patient these are normal side effects of IV iodinated contrast, not allergies, but he wanted this noted in the epic; he tolerated epidural steroid injections w/ contrast w/o difficulty)   Ketoconazole Itching   Ketorolac Tromethamine Rash    REACTION: Reaction not known   Latex Itching and Rash   Neosporin [Neomycin-Bacitracin Zn-Polymyx] Rash   Oxycodone-Acetaminophen Other (See Comments)    Tylox caused constipation   Prednisone Nausea And Vomiting    Oral route, only Other reaction(s): stomach spasms   Family History  Problem Relation Age of Onset   Hypertension Mother    Lung cancer Father 41 - 77   Hypertension Brother    Kidney failure Maternal Grandmother    Prostate cancer Neg Hx    Kidney cancer Neg Hx      Current Outpatient Medications (Cardiovascular):    diltiazem (CARDIZEM CD) 120 MG 24 hr capsule, Take 1 capsule (120 mg total) by mouth daily.   diltiazem (CARDIZEM) 30 MG tablet, TAKE (1) TABLET AS NEEDED FOR PALPITATIONS  Current Outpatient Medications (Respiratory):    fluticasone (FLONASE) 50 MCG/ACT nasal spray, Place 2 sprays into both nostrils daily.  Current Outpatient Medications (Analgesics):    Galcanezumab-gnlm (EMGALITY) 120 MG/ML SOAJ, Inject 120 mg into the skin every 28 (twenty-eight) days.   Rimegepant Sulfate (NURTEC) 75 MG TBDP, Take 75 mg by mouth as needed for up to 1  dose (Maximum 1 tablet in 24 hours.).   Current Outpatient Medications (Other):    baclofen (LIORESAL) 10 MG tablet, Take 0.5 tablets (5 mg total) by mouth at bedtime as needed for muscle spasms.   hydrOXYzine (ATARAX) 50 MG tablet, Take 25-50 mg by mouth 2 (two) times daily as needed for itching.   metoCLOPramide (REGLAN) 5 MG tablet, Take 5 mg by mouth as needed.   ondansetron (ZOFRAN-ODT) 8 MG disintegrating tablet, Take 8 mg by mouth 3 (three) times daily as needed.   polyethylene glycol powder (GLYCOLAX/MIRALAX) 17 GM/SCOOP powder, Take 17 g by mouth as needed for moderate constipation.   Reviewed prior external information including notes and imaging from  primary care provider As well as notes that were  available from care everywhere and other healthcare systems.  Past medical history, social, surgical and family history all reviewed in electronic medical record.  No pertanent information unless stated regarding to the chief complaint.   Review of Systems:  No headache, visual changes, nausea, vomiting, diarrhea, constipation, dizziness, abdominal pain, skin rash, fevers, chills, night sweats, weight loss, swollen lymph nodes, body aches, joint swelling, chest pain, shortness of breath, mood changes. POSITIVE muscle aches  Objective  There were no vitals taken for this visit.   General: No apparent distress alert and oriented x3 mood and affect normal, dressed appropriately.  HEENT: Pupils equal, extraocular movements intact  Respiratory: Patient's speak in full sentences and does not appear short of breath  Cardiovascular: No lower extremity edema, non tender, no erythema      Impression and Recommendations:

## 2022-04-11 NOTE — Progress Notes (Deleted)
[name] Phone: [phone] Subjective:    I'm seeing this patient by the request  of:  [name], MD  CC: left low back pain   [id number]  01/03/2022 Worsening left-sided lumbar radiculopathy. I think this is more of a flare. Patient did have improvement. Does seem to be worse with movement. Can be muscular in nature. Has had baclofen needed for breakthrough. Toradol and Depo-Medrol given but did not want to do any other medication or try to delay within epidural with patient undergoing radio seed placement in the near future. Discussed with patient about icing regimen and home exercises otherwise. Follow-up with me again 6 weeks after his surgery. Worsening pain in the back and he does need to seek medical attention immediately.   Update 04/16/2022 William [name] is a 61 y.o. male coming in with complaint of L lumbar radiculopathy.  Since we have seen patient patient has had the radiation seeds placed in the prostate.  Been seeing neurology for migraines and has seen even cardiology for SVT.  Patient states      Past Medical History:  Diagnosis Date   Allergy    Anxiety    Chronic kidney disease    Depression    Diverticulitis    Gastroparesis    Heart murmur    Migraine headache    Mitral valve prolapse    SVT (supraventricular tachycardia)    Past Surgical History:  Procedure Laterality Date   COLONOSCOPY W/ BIOPSIES  2023   RADIOACTIVE SEED IMPLANT N/A 01/19/2022   Procedure: RADIOACTIVE SEED IMPLANT/BRACHYTHERAPY IMPLANT;  Surgeon: [name], MD;  Location: [facility];  Service: Urology;  Laterality: N/A;   SPACE OAR INSTILLATION N/A 01/19/2022   Procedure: SPACE OAR INSTILLATION;  Surgeon: [name], MD;  Location: [facility];  Service: Urology;  Laterality: N/A;   Social History   Socioeconomic History   Marital status: Married    Spouse name: Not  on file   Number of children: 0   Years of education: Not on file   Highest education level: Bachelor's degree (e.g., BA, AB, BS)  Occupational History   Not on file  Tobacco Use   Smoking status: Never   Smokeless tobacco: Never  Vaping Use   Vaping Use: Never used  Substance and Sexual Activity   Alcohol use: No    Alcohol/week: 0.0 standard drinks of alcohol   Drug use: No   Sexual activity: Yes    Partners: Female  Other Topics Concern   Not on file  Social History Narrative   Pt is married lives with spouse in 1 story apartment he has No children   BS degree- he is right handed   Drinks soda  Ginger ale, no coffee no tea   Right handed   Social Determinants of Health   Financial Resource Strain: Low Risk  (10/25/2021)   Overall Financial Resource Strain (CARDIA)    Difficulty of Paying Living Expenses: Not hard at all  Food Insecurity: No Food Insecurity (10/25/2021)   Hunger Vital Sign    Worried About Running Out of Food in the Last Year: Never true    Ran Out of Food in the Last Year: Never true  Transportation Needs: No Transportation Needs (10/25/2021)   PRAPARE - Hydrologist (Medical): No    Lack of Transportation (Non-Medical): No  Physical Activity: Not on file  Stress:  Stress Concern Present (10/25/2021)   [place]    Feeling of Stress : Rather much  Social Connections: Not on file   Allergies  Allergen Reactions   Erythromycin Anaphylaxis and Nausea And Vomiting   Nalbuphine Other (See Comments)    States loss of consciousness    Cefdinir Diarrhea, Nausea And Vomiting and Rash   Cephalexin Diarrhea and Nausea And Vomiting   Cephalosporins Diarrhea and Nausea And Vomiting   Hydrocodone-Acetaminophen Nausea And Vomiting   Ketorolac Nausea And Vomiting    Other reaction(s): upset stomach   Prochlorperazine Edisylate Other (See Comments)    uncontrollable  shake - tremor    Restasis [Cyclosporine] Other (See Comments)    migraines   Tyloxapol Swelling   Bacitracin-Polymyxin B     Other reaction(s): rash gets worse Other reaction(s): rash gets worse   Codeine Other (See Comments)    Other reaction(s): stomach upset   Nalbuphine Hcl     Other reaction(s): stomach upset   Prochlorperazine     Other reaction(s): tremors/vomiting   Propranolol Diarrhea   Topamax [Topiramate] Diarrhea and Nausea And Vomiting   Zetia [Ezetimibe] Diarrhea   Cefdinir Diarrhea, Nausea And Vomiting and Rash    Other reaction(s): too stronge   Doxycycline Hyclate Nausea Only and Other (See Comments)    abd pain   Iodinated Contrast Media Other (See Comments)    Lightheaded, flushed feeling (explained to patient these are normal side effects of IV iodinated contrast, not allergies, but he wanted this noted in the epic; he tolerated epidural steroid injections w/ contrast w/o difficulty)   Ketoconazole Itching   Ketorolac Tromethamine Rash    REACTION: Reaction not known   Latex Itching and Rash   Neosporin [Neomycin-Bacitracin Zn-Polymyx] Rash   Oxycodone-Acetaminophen Other (See Comments)    Tylox caused constipation   Prednisone Nausea And Vomiting    Oral route, only Other reaction(s): stomach spasms   Family History  Problem Relation Age of Onset   Hypertension Mother    Lung cancer Father 40 - 55   Hypertension Brother    Kidney failure Maternal Grandmother    Prostate cancer Neg Hx    Kidney cancer Neg Hx      Current Outpatient Medications (Cardiovascular):    diltiazem (CARDIZEM CD) 120 MG 24 hr capsule, Take 1 capsule (120 mg total) by mouth daily.   diltiazem (CARDIZEM) 30 MG tablet, TAKE (1) TABLET AS NEEDED FOR PALPITATIONS  Current Outpatient Medications (Respiratory):    fluticasone (FLONASE) 50 MCG/ACT nasal spray, Place 2 sprays into both nostrils daily.  Current Outpatient Medications (Analgesics):    Galcanezumab-gnlm  (EMGALITY) 120 MG/ML SOAJ, Inject 120 mg into the skin every 28 (twenty-eight) days.   Rimegepant Sulfate (NURTEC) 75 MG TBDP, Take 75 mg by mouth as needed for up to 1 dose (Maximum 1 tablet in 24 hours.).   Current Outpatient Medications (Other):    baclofen (LIORESAL) 10 MG tablet, Take 0.5 tablets (5 mg total) by mouth at bedtime as needed for muscle spasms.   hydrOXYzine (ATARAX) 50 MG tablet, Take 25-50 mg by mouth 2 (two) times daily as needed for itching.   metoCLOPramide (REGLAN) 5 MG tablet, Take 5 mg by mouth as needed.   ondansetron (ZOFRAN-ODT) 8 MG disintegrating tablet, Take 8 mg by mouth 3 (three) times daily as needed.   polyethylene glycol powder (GLYCOLAX/MIRALAX) 17 GM/SCOOP powder, Take 17 g by mouth as needed for moderate constipation.  Reviewed prior external information including notes and imaging from  primary care provider As well as notes that were available from care everywhere and other healthcare systems.  Past medical history, social, surgical and family history all reviewed in electronic medical record.  No pertanent information unless stated regarding to the chief complaint.   Review of Systems:  No headache, visual changes, nausea, vomiting, diarrhea, constipation, dizziness, abdominal pain, skin rash, fevers, chills, night sweats, weight loss, swollen lymph nodes, body aches, joint swelling, chest pain, shortness of breath, mood changes. POSITIVE muscle aches  Objective  There were no vitals taken for this visit.   General: No apparent distress alert and oriented x3 mood and affect normal, dressed appropriately.  HEENT: Pupils equal, extraocular movements intact  Respiratory: Patient's speak in full sentences and does not appear short of breath  Cardiovascular: No lower extremity edema, non tender, no erythema      Impression and Recommendations:    The above documentation has been reviewed and is accurate and complete Lyndal Pulley, DO

## 2022-04-12 ENCOUNTER — Ambulatory Visit: Payer: PPO | Admitting: Family Medicine

## 2022-04-13 ENCOUNTER — Ambulatory Visit
Admission: RE | Admit: 2022-04-13 | Discharge: 2022-04-13 | Disposition: A | Discharge status: Home / Self Care | Payer: PPO | Source: Ambulatory Visit | Attending: Family Medicine | Admitting: Family Medicine

## 2022-04-13 ENCOUNTER — Other Ambulatory Visit: Payer: Self-pay | Admitting: Family Medicine

## 2022-04-13 DIAGNOSIS — R059 Cough, unspecified: Secondary | ICD-10-CM

## 2022-04-16 ENCOUNTER — Ambulatory Visit: Payer: PPO | Admitting: Family Medicine

## 2022-04-18 NOTE — Telephone Encounter (Signed)
[facility] approved until the end of 2024 year

## 2022-04-19 NOTE — Progress Notes (Deleted)
William [name] Phone: [phone] Subjective:    I'm seeing this patient by the request  of:  [name], MD  CC:   [id number]  01/03/2022 Worsening left-sided lumbar radiculopathy.  I think this is more of a flare.  Patient did have improvement.  Does seem to be worse with movement.  Can be muscular in nature.  Has had baclofen needed for breakthrough.  Toradol and Depo-Medrol given but did not want to do any other medication or try to delay within epidural with patient undergoing radio seed placement in the near future.  Discussed with patient about icing regimen and home exercises otherwise.  Follow-up with me again 6 weeks after his surgery.  Worsening pain in the back and he does need to seek medical attention immediately.     Update 04/26/2022 William [name] is a 61 y.o. male coming in with complaint of lumbar spine pain. Patient states       Past Medical History:  Diagnosis Date   Allergy    Anxiety    Chronic kidney disease    Depression    Diverticulitis    Gastroparesis    Heart murmur    Migraine headache    Mitral valve prolapse    SVT (supraventricular tachycardia)    Past Surgical History:  Procedure Laterality Date   COLONOSCOPY W/ BIOPSIES  2023   RADIOACTIVE SEED IMPLANT N/A 01/19/2022   Procedure: RADIOACTIVE SEED IMPLANT/BRACHYTHERAPY IMPLANT;  Surgeon: [name], MD;  Location: [facility];  Service: Urology;  Laterality: N/A;   SPACE OAR INSTILLATION N/A 01/19/2022   Procedure: SPACE OAR INSTILLATION;  Surgeon: [name], MD;  Location: [facility];  Service: Urology;  Laterality: N/A;   Social History   Socioeconomic History   Marital status: Married    Spouse name: Not on file   Number of children: 0   Years of education: Not on file   Highest education level: Bachelor's degree (e.g., BA, AB, BS)  Occupational History   Not on file   Tobacco Use   Smoking status: Never   Smokeless tobacco: Never  Vaping Use   Vaping Use: Never used  Substance and Sexual Activity   Alcohol use: No    Alcohol/week: 0.0 standard drinks of alcohol   Drug use: No   Sexual activity: Yes    Partners: Female  Other Topics Concern   Not on file  Social History Narrative   Pt is married lives with spouse in 1 story apartment he has No children   BS degree- he is right handed   Drinks soda  Ginger ale, no coffee no tea   Right handed   Social Determinants of Health   Financial Resource Strain: Low Risk  (10/25/2021)   Overall Financial Resource Strain (CARDIA)    Difficulty of Paying Living Expenses: Not hard at all  Food Insecurity: No Food Insecurity (10/25/2021)   Hunger Vital Sign    Worried About Running Out of Food in the Last Year: Never true    Ran Out of Food in the Last Year: Never true  Transportation Needs: No Transportation Needs (10/25/2021)   PRAPARE - Hydrologist (Medical): No    Lack of Transportation (Non-Medical): No  Physical Activity: Not on file  Stress: Stress Concern Present (10/25/2021)   [place]    Feeling of Stress :  Rather much  Social Connections: Not on file   Allergies  Allergen Reactions   Erythromycin Anaphylaxis and Nausea And Vomiting   Nalbuphine Other (See Comments)    States loss of consciousness    Cefdinir Diarrhea, Nausea And Vomiting and Rash   Cephalexin Diarrhea and Nausea And Vomiting   Cephalosporins Diarrhea and Nausea And Vomiting   Hydrocodone-Acetaminophen Nausea And Vomiting   Ketorolac Nausea And Vomiting    Other reaction(s): upset stomach   Prochlorperazine Edisylate Other (See Comments)    uncontrollable shake - tremor    Restasis [Cyclosporine] Other (See Comments)    migraines   Tyloxapol Swelling   Bacitracin-Polymyxin B     Other reaction(s): rash gets worse Other  reaction(s): rash gets worse   Codeine Other (See Comments)    Other reaction(s): stomach upset   Nalbuphine Hcl     Other reaction(s): stomach upset   Prochlorperazine     Other reaction(s): tremors/vomiting   Propranolol Diarrhea   Topamax [Topiramate] Diarrhea and Nausea And Vomiting   Zetia [Ezetimibe] Diarrhea   Cefdinir Diarrhea, Nausea And Vomiting and Rash    Other reaction(s): too stronge   Doxycycline Hyclate Nausea Only and Other (See Comments)    abd pain   Iodinated Contrast Media Other (See Comments)    Lightheaded, flushed feeling (explained to patient these are normal side effects of IV iodinated contrast, not allergies, but he wanted this noted in the epic; he tolerated epidural steroid injections w/ contrast w/o difficulty)   Ketoconazole Itching   Ketorolac Tromethamine Rash    REACTION: Reaction not known   Latex Itching and Rash   Neosporin [Neomycin-Bacitracin Zn-Polymyx] Rash   Oxycodone-Acetaminophen Other (See Comments)    Tylox caused constipation   Prednisone Nausea And Vomiting    Oral route, only Other reaction(s): stomach spasms   Family History  Problem Relation Age of Onset   Hypertension Mother    Lung cancer Father 37 - 75   Hypertension Brother    Kidney failure Maternal Grandmother    Prostate cancer Neg Hx    Kidney cancer Neg Hx      Current Outpatient Medications (Cardiovascular):    diltiazem (CARDIZEM CD) 120 MG 24 hr capsule, Take 1 capsule (120 mg total) by mouth daily.   diltiazem (CARDIZEM) 30 MG tablet, TAKE (1) TABLET AS NEEDED FOR PALPITATIONS  Current Outpatient Medications (Respiratory):    fluticasone (FLONASE) 50 MCG/ACT nasal spray, Place 2 sprays into both nostrils daily.  Current Outpatient Medications (Analgesics):    Galcanezumab-gnlm (EMGALITY) 120 MG/ML SOAJ, Inject 120 mg into the skin every 28 (twenty-eight) days.   Rimegepant Sulfate (NURTEC) 75 MG TBDP, Take 75 mg by mouth as needed for up to 1 dose (Maximum  1 tablet in 24 hours.).   Current Outpatient Medications (Other):    baclofen (LIORESAL) 10 MG tablet, Take 0.5 tablets (5 mg total) by mouth at bedtime as needed for muscle spasms.   hydrOXYzine (ATARAX) 50 MG tablet, Take 25-50 mg by mouth 2 (two) times daily as needed for itching.   metoCLOPramide (REGLAN) 5 MG tablet, Take 5 mg by mouth as needed.   ondansetron (ZOFRAN-ODT) 8 MG disintegrating tablet, Take 8 mg by mouth 3 (three) times daily as needed.   polyethylene glycol powder (GLYCOLAX/MIRALAX) 17 GM/SCOOP powder, Take 17 g by mouth as needed for moderate constipation.   Reviewed prior external information including notes and imaging from  primary care provider As well as notes that were available from  care everywhere and other healthcare systems.  Past medical history, social, surgical and family history all reviewed in electronic medical record.  No pertanent information unless stated regarding to the chief complaint.   Review of Systems:  No headache, visual changes, nausea, vomiting, diarrhea, constipation, dizziness, abdominal pain, skin rash, fevers, chills, night sweats, weight loss, swollen lymph nodes, body aches, joint swelling, chest pain, shortness of breath, mood changes. POSITIVE muscle aches  Objective  There were no vitals taken for this visit.   General: No apparent distress alert and oriented x3 mood and affect normal, dressed appropriately.  HEENT: Pupils equal, extraocular movements intact  Respiratory: Patient's speak in full sentences and does not appear short of breath  Cardiovascular: No lower extremity edema, non tender, no erythema      Impression and Recommendations:

## 2022-04-26 ENCOUNTER — Ambulatory Visit: Payer: PPO | Admitting: Family Medicine

## 2022-05-01 DIAGNOSIS — C61 Malignant neoplasm of prostate: Secondary | ICD-10-CM | POA: Diagnosis not present

## 2022-05-03 ENCOUNTER — Ambulatory Visit: Payer: PPO | Admitting: Cardiology

## 2022-05-04 DIAGNOSIS — G44001 Cluster headache syndrome, unspecified, intractable: Secondary | ICD-10-CM | POA: Diagnosis not present

## 2022-05-04 DIAGNOSIS — M6281 Muscle weakness (generalized): Secondary | ICD-10-CM | POA: Diagnosis not present

## 2022-05-04 DIAGNOSIS — M62838 Other muscle spasm: Secondary | ICD-10-CM | POA: Diagnosis not present

## 2022-05-07 DIAGNOSIS — R351 Nocturia: Secondary | ICD-10-CM | POA: Diagnosis not present

## 2022-05-07 DIAGNOSIS — R3915 Urgency of urination: Secondary | ICD-10-CM | POA: Diagnosis not present

## 2022-05-07 DIAGNOSIS — C61 Malignant neoplasm of prostate: Secondary | ICD-10-CM | POA: Diagnosis not present

## 2022-05-07 DIAGNOSIS — N401 Enlarged prostate with lower urinary tract symptoms: Secondary | ICD-10-CM | POA: Diagnosis not present

## 2022-05-07 NOTE — Progress Notes (Deleted)
[place] [address] [place] Phone: [phone] Subjective:    I'm seeing this patient by the request  of:  [name], MD  CC:   [id number]  William [name] is a 62 y.o. male coming in with complaint of ***  Onset-  Location Duration-  Character- Aggravating factors- Reliving factors-  Therapies tried-  Severity-     Past Medical History:  Diagnosis Date   Allergy    Anxiety    Chronic kidney disease    Depression    Diverticulitis    Gastroparesis    Heart murmur    Migraine headache    Mitral valve prolapse    SVT (supraventricular tachycardia)    Past Surgical History:  Procedure Laterality Date   COLONOSCOPY W/ BIOPSIES  2023   RADIOACTIVE SEED IMPLANT N/A 01/19/2022   Procedure: RADIOACTIVE SEED IMPLANT/BRACHYTHERAPY IMPLANT;  Surgeon: [name], MD;  Location: [facility];  Service: Urology;  Laterality: N/A;   SPACE OAR INSTILLATION N/A 01/19/2022   Procedure: SPACE OAR INSTILLATION;  Surgeon: [name], MD;  Location: [facility];  Service: Urology;  Laterality: N/A;   Social History   Socioeconomic History   Marital status: Married    Spouse name: Not on file   Number of children: 0   Years of education: Not on file   Highest education level: Bachelor's degree (e.g., BA, AB, BS)  Occupational History   Not on file  Tobacco Use   Smoking status: Never   Smokeless tobacco: Never  Vaping Use   Vaping Use: Never used  Substance and Sexual Activity   Alcohol use: No    Alcohol/week: 0.0 standard drinks of alcohol   Drug use: No   Sexual activity: Yes    Partners: Female  Other Topics Concern   Not on file  Social History Narrative   Pt is married lives with spouse in 1 story apartment he has No children   BS degree- he is right handed   Drinks soda  Ginger ale, no coffee no tea   Right handed   Social Determinants of Health   Financial  Resource Strain: Low Risk  (10/25/2021)   Overall Financial Resource Strain (CARDIA)    Difficulty of Paying Living Expenses: Not hard at all  Food Insecurity: No Food Insecurity (10/25/2021)   Hunger Vital Sign    Worried About Running Out of Food in the Last Year: Never true    Ran Out of Food in the Last Year: Never true  Transportation Needs: No Transportation Needs (10/25/2021)   PRAPARE - Hydrologist (Medical): No    Lack of Transportation (Non-Medical): No  Physical Activity: Not on file  Stress: Stress Concern Present (10/25/2021)   [place]    Feeling of Stress : Rather much  Social Connections: Not on file   Allergies  Allergen Reactions   Erythromycin Anaphylaxis and Nausea And Vomiting   Nalbuphine Other (See Comments)    States loss of consciousness    Cefdinir Diarrhea, Nausea And Vomiting and Rash   Cephalexin Diarrhea and Nausea And Vomiting   Cephalosporins Diarrhea and Nausea And Vomiting   Hydrocodone-Acetaminophen Nausea And Vomiting   Ketorolac Nausea And Vomiting    Other reaction(s): upset stomach   Prochlorperazine Edisylate Other (See Comments)    uncontrollable shake - tremor    Restasis [Cyclosporine] Other (See Comments)  migraines   Tyloxapol Swelling   Bacitracin-Polymyxin B     Other reaction(s): rash gets worse Other reaction(s): rash gets worse   Codeine Other (See Comments)    Other reaction(s): stomach upset   Nalbuphine Hcl     Other reaction(s): stomach upset   Prochlorperazine     Other reaction(s): tremors/vomiting   Propranolol Diarrhea   Topamax [Topiramate] Diarrhea and Nausea And Vomiting   Zetia [Ezetimibe] Diarrhea   Cefdinir Diarrhea, Nausea And Vomiting and Rash    Other reaction(s): too stronge   Doxycycline Hyclate Nausea Only and Other (See Comments)    abd pain   Iodinated Contrast Media Other (See Comments)     Lightheaded, flushed feeling (explained to patient these are normal side effects of IV iodinated contrast, not allergies, but he wanted this noted in the epic; he tolerated epidural steroid injections w/ contrast w/o difficulty)   Ketoconazole Itching   Ketorolac Tromethamine Rash    REACTION: Reaction not known   Latex Itching and Rash   Neosporin [Neomycin-Bacitracin Zn-Polymyx] Rash   Oxycodone-Acetaminophen Other (See Comments)    Tylox caused constipation   Prednisone Nausea And Vomiting    Oral route, only Other reaction(s): stomach spasms   Family History  Problem Relation Age of Onset   Hypertension Mother    Lung cancer Father 46 - 15   Hypertension Brother    Kidney failure Maternal Grandmother    Prostate cancer Neg Hx    Kidney cancer Neg Hx      Current Outpatient Medications (Cardiovascular):    diltiazem (CARDIZEM CD) 120 MG 24 hr capsule, Take 1 capsule (120 mg total) by mouth daily.   diltiazem (CARDIZEM) 30 MG tablet, TAKE (1) TABLET AS NEEDED FOR PALPITATIONS  Current Outpatient Medications (Respiratory):    fluticasone (FLONASE) 50 MCG/ACT nasal spray, Place 2 sprays into both nostrils daily.  Current Outpatient Medications (Analgesics):    Galcanezumab-gnlm (EMGALITY) 120 MG/ML SOAJ, Inject 120 mg into the skin every 28 (twenty-eight) days.   Rimegepant Sulfate (NURTEC) 75 MG TBDP, Take 75 mg by mouth as needed for up to 1 dose (Maximum 1 tablet in 24 hours.).   Current Outpatient Medications (Other):    baclofen (LIORESAL) 10 MG tablet, Take 0.5 tablets (5 mg total) by mouth at bedtime as needed for muscle spasms.   hydrOXYzine (ATARAX) 50 MG tablet, Take 25-50 mg by mouth 2 (two) times daily as needed for itching.   metoCLOPramide (REGLAN) 5 MG tablet, Take 5 mg by mouth as needed.   ondansetron (ZOFRAN-ODT) 8 MG disintegrating tablet, Take 8 mg by mouth 3 (three) times daily as needed.   polyethylene glycol powder (GLYCOLAX/MIRALAX) 17 GM/SCOOP powder,  Take 17 g by mouth as needed for moderate constipation.   Reviewed prior external information including notes and imaging from  primary care provider As well as notes that were available from care everywhere and other healthcare systems.  Past medical history, social, surgical and family history all reviewed in electronic medical record.  No pertanent information unless stated regarding to the chief complaint.   Review of Systems:  No headache, visual changes, nausea, vomiting, diarrhea, constipation, dizziness, abdominal pain, skin rash, fevers, chills, night sweats, weight loss, swollen lymph nodes, body aches, joint swelling, chest pain, shortness of breath, mood changes. POSITIVE muscle aches  Objective  There were no vitals taken for this visit.   General: No apparent distress alert and oriented x3 mood and affect normal, dressed appropriately.  HEENT: Pupils equal,  extraocular movements intact  Respiratory: Patient's speak in full sentences and does not appear short of breath  Cardiovascular: No lower extremity edema, non tender, no erythema      Impression and Recommendations:     The above documentation has been reviewed and is accurate and complete Lyndal Pulley, DO

## 2022-05-08 ENCOUNTER — Ambulatory Visit: Payer: PPO | Admitting: Family Medicine

## 2022-05-08 DIAGNOSIS — C61 Malignant neoplasm of prostate: Secondary | ICD-10-CM | POA: Diagnosis not present

## 2022-05-08 DIAGNOSIS — M791 Myalgia, unspecified site: Secondary | ICD-10-CM | POA: Diagnosis not present

## 2022-05-11 ENCOUNTER — Ambulatory Visit (INDEPENDENT_AMBULATORY_CARE_PROVIDER_SITE_OTHER): Payer: PPO

## 2022-05-11 ENCOUNTER — Ambulatory Visit: Payer: PPO | Admitting: Family Medicine

## 2022-05-11 VITALS — BP 110/72 | HR 78 | Ht 70.0 in | Wt 138.0 lb

## 2022-05-11 DIAGNOSIS — M5030 Other cervical disc degeneration, unspecified cervical region: Secondary | ICD-10-CM

## 2022-05-11 DIAGNOSIS — M542 Cervicalgia: Secondary | ICD-10-CM | POA: Diagnosis not present

## 2022-05-11 DIAGNOSIS — M5031 Other cervical disc degeneration,  high cervical region: Secondary | ICD-10-CM | POA: Diagnosis not present

## 2022-05-11 DIAGNOSIS — C61 Malignant neoplasm of prostate: Secondary | ICD-10-CM

## 2022-05-11 NOTE — Progress Notes (Signed)
William [name] Phone: [phone] Subjective:   [name], am serving as a scribe for Dr. Hulan Saas.  I'm seeing this patient by the request  of:  William Pepper, MD  CC: neck and back pain   [id number]  01/03/2022 Worsening left-sided lumbar radiculopathy.  I think this is more of a flare.  Patient did have improvement.  Does seem to be worse with movement.  Can be muscular in nature.  Has had baclofen needed for breakthrough.  Toradol and Depo-Medrol given but did not want to do any other medication or try to delay within epidural with patient undergoing radio seed placement in the near future.  Discussed with patient about icing regimen and home exercises otherwise.  Follow-up with me again 6 weeks after his surgery.  Worsening pain in the back and he does need to seek medical attention immediately.     Update 05/11/2022 William [name] is a 62 y.o. male coming in with complaint of neck and lumbar spine pain. Patient states that he hurt his neck sleeping on sofa for many weeks. Pain in both traps going down into B scapula.   Intermittent lower back pain. Will be sharp at times. Unable to pick up anything heavy.   Would like to understand PSA values in his recent labwork. Wants to know if seed implant worked or not.     Past Medical History:  Diagnosis Date   Allergy    Anxiety    Chronic kidney disease    Depression    Diverticulitis    Gastroparesis    Heart murmur    Migraine headache    Mitral valve prolapse    SVT (supraventricular tachycardia)    Past Surgical History:  Procedure Laterality Date   COLONOSCOPY W/ BIOPSIES  2023   RADIOACTIVE SEED IMPLANT N/A 01/19/2022   Procedure: RADIOACTIVE SEED IMPLANT/BRACHYTHERAPY IMPLANT;  Surgeon: [name], MD;  Location: [facility];  Service: Urology;  Laterality: N/A;   SPACE OAR INSTILLATION N/A 01/19/2022   Procedure: SPACE  OAR INSTILLATION;  Surgeon: [name], MD;  Location: [facility];  Service: Urology;  Laterality: N/A;   Social History   Socioeconomic History   Marital status: Married    Spouse name: Not on file   Number of children: 0   Years of education: Not on file   Highest education level: Bachelor's degree (e.g., BA, AB, BS)  Occupational History   Not on file  Tobacco Use   Smoking status: Never   Smokeless tobacco: Never  Vaping Use   Vaping Use: Never used  Substance and Sexual Activity   Alcohol use: No    Alcohol/week: 0.0 standard drinks of alcohol   Drug use: No   Sexual activity: Yes    Partners: Female  Other Topics Concern   Not on file  Social History Narrative   Pt is married lives with spouse in 1 story apartment he has No children   BS degree- he is right handed   Drinks soda  Ginger ale, no coffee no tea   Right handed   Social Determinants of Health   Financial Resource Strain: Low Risk  (10/25/2021)   Overall Financial Resource Strain (CARDIA)    Difficulty of Paying Living Expenses: Not hard at all  Food Insecurity: No Food Insecurity (10/25/2021)   Hunger Vital Sign    Worried About Running Out of Food in the Last  Year: Never true    [place] in the Last Year: Never true  Transportation Needs: No Transportation Needs (10/25/2021)   PRAPARE - Hydrologist (Medical): No    Lack of Transportation (Non-Medical): No  Physical Activity: Not on file  Stress: Stress Concern Present (10/25/2021)   [name]    Feeling of Stress : Rather much  Social Connections: Not on file   Allergies  Allergen Reactions   Erythromycin Anaphylaxis and Nausea And Vomiting   Nalbuphine Other (See Comments)    States loss of consciousness    Cefdinir Diarrhea, Nausea And Vomiting and Rash   Cephalexin Diarrhea and Nausea And Vomiting   Cephalosporins Diarrhea  and Nausea And Vomiting   Hydrocodone-Acetaminophen Nausea And Vomiting   Ketorolac Nausea And Vomiting    Other reaction(s): upset stomach   Prochlorperazine Edisylate Other (See Comments)    uncontrollable shake - tremor    Restasis [Cyclosporine] Other (See Comments)    migraines   Tyloxapol Swelling   Bacitracin-Polymyxin B     Other reaction(s): rash gets worse Other reaction(s): rash gets worse   Codeine Other (See Comments)    Other reaction(s): stomach upset   Nalbuphine Hcl     Other reaction(s): stomach upset   Prochlorperazine     Other reaction(s): tremors/vomiting   Propranolol Diarrhea   Topamax [Topiramate] Diarrhea and Nausea And Vomiting   Zetia [Ezetimibe] Diarrhea   Cefdinir Diarrhea, Nausea And Vomiting and Rash    Other reaction(s): too stronge   Doxycycline Hyclate Nausea Only and Other (See Comments)    abd pain   Iodinated Contrast Media Other (See Comments)    Lightheaded, flushed feeling (explained to patient these are normal side effects of IV iodinated contrast, not allergies, but he wanted this noted in the epic; he tolerated epidural steroid injections w/ contrast w/o difficulty)   Ketoconazole Itching   Ketorolac Tromethamine Rash    REACTION: Reaction not known   Latex Itching and Rash   Neosporin [Neomycin-Bacitracin Zn-Polymyx] Rash   Oxycodone-Acetaminophen Other (See Comments)    Tylox caused constipation   Prednisone Nausea And Vomiting    Oral route, only Other reaction(s): stomach spasms   Family History  Problem Relation Age of Onset   Hypertension Mother    Lung cancer Father 70 - 43   Hypertension Brother    Kidney failure Maternal Grandmother    Prostate cancer Neg Hx    Kidney cancer Neg Hx      Current Outpatient Medications (Cardiovascular):    diltiazem (CARDIZEM CD) 120 MG 24 hr capsule, Take 1 capsule (120 mg total) by mouth daily.   diltiazem (CARDIZEM) 30 MG tablet, TAKE (1) TABLET AS NEEDED FOR  PALPITATIONS  Current Outpatient Medications (Respiratory):    fluticasone (FLONASE) 50 MCG/ACT nasal spray, Place 2 sprays into both nostrils daily.  Current Outpatient Medications (Analgesics):    Galcanezumab-gnlm (EMGALITY) 120 MG/ML SOAJ, Inject 120 mg into the skin every 28 (twenty-eight) days.   Rimegepant Sulfate (NURTEC) 75 MG TBDP, Take 75 mg by mouth as needed for up to 1 dose (Maximum 1 tablet in 24 hours.).   Current Outpatient Medications (Other):    baclofen (LIORESAL) 10 MG tablet, Take 0.5 tablets (5 mg total) by mouth at bedtime as needed for muscle spasms.   hydrOXYzine (ATARAX) 50 MG tablet, Take 25-50 mg by mouth 2 (two) times daily as needed for itching.  metoCLOPramide (REGLAN) 5 MG tablet, Take 5 mg by mouth as needed.   ondansetron (ZOFRAN-ODT) 8 MG disintegrating tablet, Take 8 mg by mouth 3 (three) times daily as needed.   polyethylene glycol powder (GLYCOLAX/MIRALAX) 17 GM/SCOOP powder, Take 17 g by mouth as needed for moderate constipation.   Review of Systems:  No headache, visual changes, nausea, vomiting, diarrhea, constipation, dizziness, abdominal pain, skin rash, fevers, chills, night sweats,  swollen lymph nodes, body aches, joint swelling, chest pain, shortness of breath, mood changes. POSITIVE muscle aches, weight loss  Objective  Blood pressure 110/72, pulse 78, height '5\' 10"'$  (1.778 m), weight 138 lb (62.6 kg), SpO2 98 %.   General: No apparent distress alert and oriented x3 mood and affect normal, dressed appropriately.  HEENT: Pupils equal, extraocular movements intact  Respiratory: Patient's speak in full sentences and does not appear short of breath  Cardiovascular: No lower extremity edema, non tender, no erythema  Neck exam does have some loss of lordosis, patient does look appear somewhat cachectic.  Patient is negative Spurling's.  Patient has 4-5 strength in the shoulder girdles bilaterally. Back exam does have some loss lordosis as well.   Patient though is able to get up from a sitting position without any significant difficulty.   Impression and Recommendations:    The above documentation has been reviewed and is accurate and complete Lyndal Pulley, DO

## 2022-05-11 NOTE — Patient Instructions (Signed)
Taking Lexapro is good idea Keep trying to put more weight on Keep hands in peripheral vision See me in 2 months

## 2022-05-11 NOTE — Assessment & Plan Note (Signed)
Known degenerative disc disease.  The patient has lost some muscle mass fairly significantly.  Encouraged him to start doing some strengthening.  Patient will to try to do this on his own patient work with athletic trainer to learn the home exercises.  We discussed which activities to do and which ones to avoid.  Discussed with patient at great length about different things to monitor.  Patient is highly concerned about any recurrent cancer.  We discussed we will get the x-rays.  We discussed patient's laboratory workup recently from outside facilities do think I will add downward trending PSA.  Patient is still not having much of a appetite.  We discussed different medications such as well along some point if necessary but encouraged him to start with the Lexapro which he has been prescribed but has not started.  Follow-up again 2 months.  Total time with patient 31 minutes

## 2022-05-16 ENCOUNTER — Telehealth: Payer: Self-pay | Admitting: Family Medicine

## 2022-05-16 NOTE — Telephone Encounter (Signed)
Pt having some interior neck pain, sore throat and difficulty swallowing. Advised pt to follow up with PCP ( has appt for this Friday ).  FYI in case this is related to neck pain at recent visit.

## 2022-05-17 DIAGNOSIS — M542 Cervicalgia: Secondary | ICD-10-CM | POA: Diagnosis not present

## 2022-05-21 DIAGNOSIS — M542 Cervicalgia: Secondary | ICD-10-CM | POA: Diagnosis not present

## 2022-05-21 DIAGNOSIS — I700 Atherosclerosis of aorta: Secondary | ICD-10-CM | POA: Diagnosis not present

## 2022-05-21 DIAGNOSIS — C61 Malignant neoplasm of prostate: Secondary | ICD-10-CM | POA: Diagnosis not present

## 2022-05-24 ENCOUNTER — Ambulatory Visit: Payer: PPO | Admitting: Family Medicine

## 2022-05-28 ENCOUNTER — Other Ambulatory Visit (HOSPITAL_COMMUNITY): Payer: Self-pay

## 2022-05-28 ENCOUNTER — Other Ambulatory Visit: Payer: Self-pay | Admitting: *Deleted

## 2022-05-31 ENCOUNTER — Ambulatory Visit: Payer: PPO | Admitting: Family Medicine

## 2022-06-06 NOTE — Progress Notes (Signed)
[place] Phone: [phone] Subjective:   William [name], am serving as a scribe for Dr. Hulan Saas.  I'm seeing this patient by the request  of:  [name], MD  CC: Neck pain and allover pain follow-up  [id number]  05/11/2022 Known degenerative disc disease.  The patient has lost some muscle mass fairly significantly.  Encouraged him to start doing some strengthening.  Patient will to try to do this on his own patient work with athletic trainer to learn the home exercises.  We discussed which activities to do and which ones to avoid.  Discussed with patient at great length about different things to monitor.  Patient is highly concerned about any recurrent cancer.  We discussed we will get the x-rays.  We discussed patient's laboratory workup recently from outside facilities do think I will add downward trending PSA.  Patient is still not having much of a appetite.  We discussed different medications such as well along some point if necessary but encouraged him to start with the Lexapro which he has been prescribed but has not started.  Follow-up again 2 months.  Total time with patient 31 minutes     Update 06/08/2022 William [name] is a 62 y.o. male coming in with complaint of hand weakness. Patient states that a couple days ago he strained his L trap. Tingling in L hand. Symptoms worsen with cervical flexion. Patient is noticing a loss of strength in his hands.   Patient wants to know what types of exercise that he is able to do.         Past Medical History:  Diagnosis Date   Allergy    Anxiety    Chronic kidney disease    Depression    Diverticulitis    Gastroparesis    Heart murmur    Migraine headache    Mitral valve prolapse    SVT (supraventricular tachycardia)    Past Surgical History:  Procedure Laterality Date   COLONOSCOPY W/ BIOPSIES  2023   RADIOACTIVE SEED IMPLANT N/A  01/19/2022   Procedure: RADIOACTIVE SEED IMPLANT/BRACHYTHERAPY IMPLANT;  Surgeon: [name], MD;  Location: [facility];  Service: Urology;  Laterality: N/A;   SPACE OAR INSTILLATION N/A 01/19/2022   Procedure: SPACE OAR INSTILLATION;  Surgeon: [name], MD;  Location[facility];  Service: Urology;  Laterality: N/A;   Social History   Socioeconomic History   Marital status: Married    Spouse name: Not on file   Number of children: 0   Years of education: Not on file   Highest education level: Bachelor's degree (e.g., BA, AB, BS)  Occupational History   Not on file  Tobacco Use   Smoking status: Never   Smokeless tobacco: Never  Vaping Use   Vaping Use: Never used  Substance and Sexual Activity   Alcohol use: [name]    Alcohol/week: 0.0 standard drinks of alcohol   Drug use: [name]   Sexual activity: Yes    Partners: Female  Other Topics Concern   Not on file  Social History Narrative   Pt is married lives with spouse in 1 story apartment he has [name] children   BS degree- he is right handed   Drinks soda  Ginger ale, [name] coffee [name] tea   Right handed   Social Determinants of Health   Financial Resource Strain: Low Risk  (10/25/2021)   Overall Financial Resource  Strain (CARDIA)    Difficulty of Paying Living Expenses: Not hard at all  Food Insecurity: [name] Food Insecurity (10/25/2021)   Hunger Vital Sign    Worried About Running Out of Food in the Last Year: Never true    Ran Out of Food in the Last Year: Never true  Transportation Needs: [name] Transportation Needs (10/25/2021)   PRAPARE - Hydrologist (Medical): [name]    Lack of Transportation (Non-Medical): [name]  Physical Activity: Not on file  Stress: Stress Concern Present (10/25/2021)   [place]    Feeling of Stress : Rather much  Social Connections: Not on file   Allergies  Allergen Reactions    Erythromycin Anaphylaxis and Nausea And Vomiting   Nalbuphine Other (See Comments)    States loss of consciousness    Cefdinir Diarrhea, Nausea And Vomiting and Rash   Cephalexin Diarrhea and Nausea And Vomiting   Cephalosporins Diarrhea and Nausea And Vomiting   Hydrocodone-Acetaminophen Nausea And Vomiting   Ketorolac Nausea And Vomiting    Other reaction(s): upset stomach   Prochlorperazine Edisylate Other (See Comments)    uncontrollable shake - tremor    Restasis [Cyclosporine] Other (See Comments)    migraines   Tyloxapol Swelling   Bacitracin-Polymyxin B     Other reaction(s): rash gets worse Other reaction(s): rash gets worse   Codeine Other (See Comments)    Other reaction(s): stomach upset   Nalbuphine Hcl     Other reaction(s): stomach upset   Prochlorperazine     Other reaction(s): tremors/vomiting   Propranolol Diarrhea   Topamax [Topiramate] Diarrhea and Nausea And Vomiting   Zetia [Ezetimibe] Diarrhea   Cefdinir Diarrhea, Nausea And Vomiting and Rash    Other reaction(s): too stronge   Doxycycline Hyclate Nausea Only and Other (See Comments)    abd pain   Iodinated Contrast Media Other (See Comments)    Lightheaded, flushed feeling (explained to patient these are normal side effects of IV iodinated contrast, not allergies, but he wanted this noted in the epic; he tolerated epidural steroid injections w/ contrast w/o difficulty)   Ketoconazole Itching   Ketorolac Tromethamine Rash    REACTION: Reaction not known   Latex Itching and Rash   Neosporin [Neomycin-Bacitracin Zn-Polymyx] Rash   Oxycodone-Acetaminophen Other (See Comments)    Tylox caused constipation   Prednisone Nausea And Vomiting    Oral route, only Other reaction(s): stomach spasms   Family History  Problem Relation Age of Onset   Hypertension Mother    Lung cancer Father 2 - 52   Hypertension Brother    Kidney failure Maternal Grandmother    Prostate cancer Neg Hx    Kidney cancer Neg  Hx      Current Outpatient Medications (Cardiovascular):    diltiazem (CARDIZEM CD) 120 MG 24 hr capsule, Take 1 capsule (120 mg total) by mouth daily.   diltiazem (CARDIZEM) 30 MG tablet, TAKE (1) TABLET AS NEEDED FOR PALPITATIONS  Current Outpatient Medications (Respiratory):    fluticasone (FLONASE) 50 MCG/ACT nasal spray, Place 2 sprays into both nostrils daily.  Current Outpatient Medications (Analgesics):    Galcanezumab-gnlm (EMGALITY) 120 MG/ML SOAJ, Inject 120 mg into the skin every 28 (twenty-eight) days.   Rimegepant Sulfate (NURTEC) 75 MG TBDP, Take 75 mg by mouth as needed for up to 1 dose (Maximum 1 tablet in 24 hours.).   Current Outpatient Medications (Other):    baclofen (LIORESAL) 10  MG tablet, Take 0.5 tablets (5 mg total) by mouth at bedtime as needed for muscle spasms.   hydrOXYzine (ATARAX) 50 MG tablet, Take 25-50 mg by mouth 2 (two) times daily as needed for itching.   metoCLOPramide (REGLAN) 5 MG tablet, Take 5 mg by mouth as needed.   mirtazapine (REMERON) 15 MG tablet, Take 1 tablet (15 mg total) by mouth at bedtime.   ondansetron (ZOFRAN-ODT) 8 MG disintegrating tablet, Take 8 mg by mouth 3 (three) times daily as needed.   polyethylene glycol powder (GLYCOLAX/MIRALAX) 17 GM/SCOOP powder, Take 17 g by mouth as needed for moderate constipation.   Reviewed prior external information including notes and imaging from  primary care provider As well as notes that were available from care everywhere and other healthcare systems.  Past medical history, social, surgical and family history all reviewed in electronic medical record.  [name] pertanent information unless stated regarding to the chief complaint.   Review of Systems:  [name] headache, visual changes, nausea, vomiting, diarrhea, constipation, dizziness, abdominal pain, skin rash, fevers, chills, night sweats, weight loss, swollen lymph nodes,  joint swelling, chest pain, shortness of breath, mood changes. POSITIVE  muscle aches, body aches  Objective  Blood pressure 110/78, pulse 78, height 5' 10"$  (1.778 m), weight 137 lb (62.1 kg), SpO2 98 %.   General: [name] apparent distress alert and oriented x3 mood and affect normal, dressed appropriately.  HEENT: Pupils equal, extraocular movements intact  Respiratory: Patient's speak in full sentences and does not appear short of breath  Cardiovascular: [name] lower extremity edema, non tender, [name] erythema  Neck exam does have some loss lordosis.  Patient has good grip strength noted.  The patient does appear to be somewhat cachectic male.    Impression and Recommendations:     The above documentation has been reviewed and is accurate and complete Lyndal Pulley, DO

## 2022-06-08 ENCOUNTER — Ambulatory Visit: Payer: PPO | Admitting: Family Medicine

## 2022-06-08 VITALS — BP 110/78 | HR 78 | Ht 70.0 in | Wt 137.0 lb

## 2022-06-08 DIAGNOSIS — F32A Depression, unspecified: Secondary | ICD-10-CM | POA: Diagnosis not present

## 2022-06-08 DIAGNOSIS — M5412 Radiculopathy, cervical region: Secondary | ICD-10-CM

## 2022-06-08 DIAGNOSIS — E538 Deficiency of other specified B group vitamins: Secondary | ICD-10-CM

## 2022-06-08 DIAGNOSIS — M5030 Other cervical disc degeneration, unspecified cervical region: Secondary | ICD-10-CM

## 2022-06-08 DIAGNOSIS — Z636 Dependent relative needing care at home: Secondary | ICD-10-CM

## 2022-06-08 DIAGNOSIS — R634 Abnormal weight loss: Secondary | ICD-10-CM | POA: Insufficient documentation

## 2022-06-08 MED ORDER — CYANOCOBALAMIN 1000 MCG/ML IJ SOLN
1000.0000 ug | Freq: Once | INTRAMUSCULAR | Status: AC
  Administered 2022-06-08: 1000 ug via INTRAMUSCULAR

## 2022-06-08 MED ORDER — MIRTAZAPINE 15 MG PO TABS: 15.0000 mg | ORAL_TABLET | Freq: Every day | ORAL | 0 refills | Status: DC

## 2022-06-08 NOTE — Assessment & Plan Note (Signed)
Patient likely is having more some degenerative disc disease of the cervical spine causing more of the tightness in the upper back.  We discussed formal physical therapy and will be referred appropriately.  Has responded well to physical therapy previously and hopefully this will do well.  Patient has lost a lot of weight and we discussed potentially workup for that.  Feels like he has been given a good amount of workup at the moment by primary care.  We will hold at this moment.  Discussed icing regimen and home exercises, which activities to do and which ones to avoid.  Follow-up with me again in 6 to 8 weeks

## 2022-06-08 NOTE — Assessment & Plan Note (Signed)
Injection given today and will do another 1 in 4 weeks with the idea of potentially checking B12 again in 2 months

## 2022-06-08 NOTE — Assessment & Plan Note (Signed)
Still has significant burden from taking care of his mother.  Discussed icing regimen and home exercises, discussed different behavioral counseling that I think will be beneficial.  Do believe that patient also has underlying depression that is concerning.  No suicidal or homicidal ideation.  Referred patient to behavioral health for further evaluation and treatment.

## 2022-06-08 NOTE — Patient Instructions (Addendum)
B12 injection today Use protein shake once during the day and then before bed PT [place] Referral to behavioral health Referral to nutrition for weight loss Remeron 25m nightly  See me again in 8 weeks but have another B12 injection schedule in 4 weeks

## 2022-06-08 NOTE — Assessment & Plan Note (Signed)
Referred to nutrition to help with weight gain.  Remeron given today as well.

## 2022-06-14 ENCOUNTER — Ambulatory Visit: Payer: PPO

## 2022-06-19 DIAGNOSIS — F411 Generalized anxiety disorder: Secondary | ICD-10-CM | POA: Diagnosis not present

## 2022-06-19 DIAGNOSIS — L853 Xerosis cutis: Secondary | ICD-10-CM | POA: Diagnosis not present

## 2022-06-19 DIAGNOSIS — I700 Atherosclerosis of aorta: Secondary | ICD-10-CM | POA: Diagnosis not present

## 2022-06-19 DIAGNOSIS — E639 Nutritional deficiency, unspecified: Secondary | ICD-10-CM | POA: Diagnosis not present

## 2022-06-19 DIAGNOSIS — R5383 Other fatigue: Secondary | ICD-10-CM | POA: Diagnosis not present

## 2022-06-20 DIAGNOSIS — H1013 Acute atopic conjunctivitis, bilateral: Secondary | ICD-10-CM | POA: Diagnosis not present

## 2022-06-20 DIAGNOSIS — H04123 Dry eye syndrome of bilateral lacrimal glands: Secondary | ICD-10-CM | POA: Diagnosis not present

## 2022-06-21 DIAGNOSIS — L308 Other specified dermatitis: Secondary | ICD-10-CM | POA: Diagnosis not present

## 2022-06-21 DIAGNOSIS — L853 Xerosis cutis: Secondary | ICD-10-CM | POA: Diagnosis not present

## 2022-06-21 DIAGNOSIS — L905 Scar conditions and fibrosis of skin: Secondary | ICD-10-CM | POA: Diagnosis not present

## 2022-06-26 ENCOUNTER — Encounter: Payer: Self-pay | Admitting: General Practice

## 2022-06-26 NOTE — Progress Notes (Signed)
[facility] Note  Referred by nursing in consultation about support resources. William [name] by phone for very productive follow-up call. He articulated several goals as he navigates distress about recurrence and mortality:  --talk with PCP about this distress, mood, and "having more down days than good ones"  --revisit Prostate Cancer Support Group --explore psychologytoday.com's Find a Optician, dispensing to identify who might be a helpful resource for dedicated, set-apart support time (in contrast to the mutuality of friendships) --follow up with chaplain by phone on Tuesday, March 12 at [facility], unless needs arise sooner  Provided empathic listening, normalization of feelings, and assistance with exploring and identifying goals. Will follow up by phone as scheduled/as needed.   [place], [place], [facility] Pager [phone] Voicemail [phone]

## 2022-06-27 DIAGNOSIS — L905 Scar conditions and fibrosis of skin: Secondary | ICD-10-CM | POA: Diagnosis not present

## 2022-06-27 DIAGNOSIS — L821 Other seborrheic keratosis: Secondary | ICD-10-CM | POA: Diagnosis not present

## 2022-06-29 ENCOUNTER — Other Ambulatory Visit: Payer: Self-pay | Admitting: Cardiovascular Disease

## 2022-07-06 ENCOUNTER — Ambulatory Visit (INDEPENDENT_AMBULATORY_CARE_PROVIDER_SITE_OTHER): Payer: PPO

## 2022-07-06 ENCOUNTER — Ambulatory Visit: Payer: PPO

## 2022-07-06 DIAGNOSIS — E538 Deficiency of other specified B group vitamins: Secondary | ICD-10-CM

## 2022-07-06 MED ORDER — CYANOCOBALAMIN 1000 MCG/ML IJ SOLN
1000.0000 ug | Freq: Once | INTRAMUSCULAR | Status: AC
  Administered 2022-07-06: 1000 ug via INTRAMUSCULAR

## 2022-07-06 NOTE — Progress Notes (Deleted)
B12 injection given in right deltoid per Dr. Tamala Julian. Patient tolerated injection well.

## 2022-07-06 NOTE — Progress Notes (Unsigned)
B12 injection given in right deltoid per Dr. Tamala Julian. Patient tolerated injection well.

## 2022-07-06 NOTE — Progress Notes (Signed)
B12 given in right deltoid per Dr. Tamala Julian. Patient tolerated injection well

## 2022-07-10 ENCOUNTER — Encounter: Payer: Self-pay | Admitting: General Practice

## 2022-07-10 ENCOUNTER — Ambulatory Visit: Payer: PPO

## 2022-07-10 ENCOUNTER — Ambulatory Visit: Payer: PPO | Admitting: Physical Therapy

## 2022-07-10 ENCOUNTER — Ambulatory Visit: Payer: PPO | Admitting: Family Medicine

## 2022-07-10 NOTE — Progress Notes (Signed)
[place] Note  William [name] called to cancel today's phone appointment due to dental/medical issues and plans to phone again to reschedule.   [place], [place], [facility] Pager [phone] Voicemail [phone]

## 2022-07-17 ENCOUNTER — Ambulatory Visit: Payer: PPO

## 2022-07-18 ENCOUNTER — Ambulatory Visit (INDEPENDENT_AMBULATORY_CARE_PROVIDER_SITE_OTHER): Payer: PPO | Admitting: Dermatology

## 2022-07-18 VITALS — BP 93/59

## 2022-07-18 DIAGNOSIS — L301 Dyshidrosis [pompholyx]: Secondary | ICD-10-CM | POA: Diagnosis not present

## 2022-07-18 DIAGNOSIS — L219 Seborrheic dermatitis, unspecified: Secondary | ICD-10-CM

## 2022-07-18 DIAGNOSIS — D239 Other benign neoplasm of skin, unspecified: Secondary | ICD-10-CM | POA: Diagnosis not present

## 2022-07-18 DIAGNOSIS — L905 Scar conditions and fibrosis of skin: Secondary | ICD-10-CM

## 2022-07-18 MED ORDER — CLOBETASOL PROPIONATE 0.05 % EX SOLN: 1.0000 | Freq: Two times a day (BID) | CUTANEOUS | 2 refills | Status: DC

## 2022-07-18 MED ORDER — CLOBETASOL PROPIONATE 0.05 % EX CREA: 1.0000 | TOPICAL_CREAM | Freq: Two times a day (BID) | CUTANEOUS | 2 refills | Status: DC

## 2022-07-18 NOTE — Patient Instructions (Addendum)
Clobetasol cream bid for 2 weeks. If not improved or condition flares, wait 2 weeks before restarting. Recommend mixing with Eucerin Advanced Repair lotion and applying morning and bedtime. At bedtime recommend wearing cotton gloves/socks over medication while sleeping.   Advised patient to wear gloves while cleaning or washing dishes.    Plan: Counseling I counseled the patient regarding the following: Skin care: Emollients, shampoos with tar, selenium or zinc pyrithione can improve seborrheic dermatitis. Expectations: Seborrheic Dermatitis is chronic in nature with periods of remissions and flares. Flares can be triggered by stress. Contact office if: Seborrheic dermatitis worsens, or fails to improve despite several months of treatment.  I recommended the following: Clobetasol solution twice daily to scalp for 2 weeks. If not improved or flares, wait 2 weeks before restarting.  The following medication counseling was provided: I discussed with the patient that prolonged use of topical steroids can result in the increased appearance of superficial blood vessels (telangiectasias), lightening (hypopigmentation) and thinning of the skin (atrophy). Patient understands to avoid using high potency steroids in skin folds, the groin or the face. The patient verbalized understanding of the proper use and possible adverse effects of topical steroids. All of the patient's questions and concerns were addressed.    Due to recent changes in healthcare laws, you may see results of your pathology and/or laboratory studies on MyChart before the doctors have had a chance to review them. We understand that in some cases there may be results that are confusing or concerning to you. Please understand that not all results are received at the same time and often the doctors may need to interpret multiple results in order to provide you with the best plan of care or course of treatment. Therefore, we ask  that you please give [place] 2 business days to thoroughly review all your results before contacting the office for clarification. Should we see a critical lab result, you will be contacted sooner.   If You Need Anything After Your Visit  If you have any questions or concerns for your doctor, please call our main line at [phone] If no one answers, please leave a voicemail as directed and we will return your call as soon as possible. Messages left after 4 pm will be answered the following business day.   You may also send [place] a message via [place]. We typically respond to MyChart messages within 1-2 business days.  For prescription refills, please ask your pharmacy to contact our office. Our fax number is [phone].  If you have an urgent issue when the clinic is closed that cannot wait until the next business day, you can page your doctor at the number below.    Please note that while we do our best to be available for urgent issues outside of office hours, we are not available 24/7.   If you have an urgent issue and are unable to reach [place], you may choose to seek medical care at your doctor's office, retail clinic, urgent care center, or emergency room.  If you have a medical emergency, please immediately call 911 or go to the emergency department. In the event of inclement weather, please call our main line at [phone] for an update on the status of any delays or closures.  Dermatology Medication Tips: Please keep the boxes that topical medications come in in order to help keep track of the instructions about where and how to use these. Pharmacies typically print the medication instructions  only on the boxes and not directly on the medication tubes.   If your medication is too expensive, please contact our office at [phone] or send [place] a message through [place].   We are unable to tell what your co-pay for medications will be in advance as this is different depending on your  insurance coverage. However, we may be able to find a substitute medication at lower cost or fill out paperwork to get insurance to cover a needed medication.   If a prior authorization is required to get your medication covered by your insurance company, please allow [place] 1-2 business days to complete this process.  Drug prices often vary depending on where the prescription is filled and some pharmacies may offer cheaper prices.  The website www.goodrx.com contains coupons for medications through different pharmacies. The prices here do not account for what the cost may be with help from insurance (it may be cheaper with your insurance), but the website can give you the price if you did not use any insurance.  - You can print the associated coupon and take it with your prescription to the pharmacy.  - You may also stop by our office during regular business hours and pick up a GoodRx coupon card.  - If you need your prescription sent electronically to a different pharmacy, notify our office through [facility] or by phone at [phone]
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
## 2022-07-19 ENCOUNTER — Encounter: Payer: Self-pay | Admitting: Dermatology

## 2022-07-19 DIAGNOSIS — Z713 Dietary counseling and surveillance: Secondary | ICD-10-CM | POA: Diagnosis not present

## 2022-07-19 DIAGNOSIS — E639 Nutritional deficiency, unspecified: Secondary | ICD-10-CM | POA: Diagnosis not present

## 2022-07-19 NOTE — Progress Notes (Deleted)
[place] Phone: [phone] Subjective:    I'm seeing this patient by the request  of:  [name], MD  CC:   [id number]  William [name] is a 62 y.o. male coming in with complaint of back and neck pain. OMT 2/92/2024. Patient states   Medications patient has been prescribed: Remeron   Taking:         Reviewed prior external information including notes and imaging from previsou exam, outside providers and external EMR if available.   As well as notes that were available from care everywhere and other healthcare systems.  Past medical history, social, surgical and family history all reviewed in electronic medical record.  No pertanent information unless stated regarding to the chief complaint.   Past Medical History:  Diagnosis Date   Allergy    Anxiety    Chronic kidney disease    Depression    Diverticulitis    Gastroparesis    Heart murmur    Migraine headache    Mitral valve prolapse    SVT (supraventricular tachycardia)     Allergies  Allergen Reactions   Erythromycin Anaphylaxis and Nausea And Vomiting   Nalbuphine Other (See Comments)    States loss of consciousness    Cefdinir Diarrhea, Nausea And Vomiting and Rash   Cephalexin Diarrhea and Nausea And Vomiting   Cephalosporins Diarrhea and Nausea And Vomiting   Hydrocodone-Acetaminophen Nausea And Vomiting   Ketorolac Nausea And Vomiting    Other reaction(s): upset stomach   Prochlorperazine Edisylate Other (See Comments)    uncontrollable shake - tremor    Restasis [Cyclosporine] Other (See Comments)    migraines   Tyloxapol Swelling   Bacitracin-Polymyxin B     Other reaction(s): rash gets worse Other reaction(s): rash gets worse   Codeine Other (See Comments)    Other reaction(s): stomach upset   Nalbuphine Hcl     Other reaction(s): stomach upset   Prochlorperazine     Other reaction(s): tremors/vomiting    Propranolol Diarrhea   Topamax [Topiramate] Diarrhea and Nausea And Vomiting   Zetia [Ezetimibe] Diarrhea   Cefdinir Diarrhea, Nausea And Vomiting and Rash    Other reaction(s): too stronge   Doxycycline Hyclate Nausea Only and Other (See Comments)    abd pain   Iodinated Contrast Media Other (See Comments)    Lightheaded, flushed feeling (explained to patient these are normal side effects of IV iodinated contrast, not allergies, but he wanted this noted in the epic; he tolerated epidural steroid injections w/ contrast w/o difficulty)   Ketoconazole Itching   Ketorolac Tromethamine Rash    REACTION: Reaction not known   Latex Itching and Rash   Neosporin [Neomycin-Bacitracin Zn-Polymyx] Rash   Oxycodone-Acetaminophen Other (See Comments)    Tylox caused constipation   Prednisone Nausea And Vomiting    Oral route, only Other reaction(s): stomach spasms     Review of Systems:  No headache, visual changes, nausea, vomiting, diarrhea, constipation, dizziness, abdominal pain, skin rash, fevers, chills, night sweats, weight loss, swollen lymph nodes, body aches, joint swelling, chest pain, shortness of breath, mood changes. POSITIVE muscle aches  Objective  There were no vitals taken for this visit.   General: No apparent distress alert and oriented x3 mood and affect normal, dressed appropriately.  HEENT: Pupils equal, extraocular movements intact  Respiratory: Patient's speak in full sentences and does not appear short of breath  Cardiovascular: No lower extremity edema, non  tender, no erythema  Gait MSK:  Back   Osteopathic findings  C2 flexed rotated and side bent right C6 flexed rotated and side bent left T3 extended rotated and side bent right inhaled rib T9 extended rotated and side bent left L2 flexed rotated and side bent right Sacrum right on right       Assessment and Plan:  No problem-specific Assessment & Plan notes found for this encounter.     Nonallopathic problems  Decision today to treat with OMT was based on Physical Exam  After verbal consent patient was treated with HVLA, ME, FPR techniques in cervical, rib, thoracic, lumbar, and sacral  areas  Patient tolerated the procedure well with improvement in symptoms  Patient given exercises, stretches and lifestyle modifications  See medications in patient instructions if given  Patient will follow up in 4-8 weeks             Note: This dictation was prepared with Dragon dictation along with smaller phrase technology. Any transcriptional errors that result from this process are unintentional.

## 2022-07-24 ENCOUNTER — Ambulatory Visit: Payer: PPO | Admitting: Physical Therapy

## 2022-07-26 DIAGNOSIS — C61 Malignant neoplasm of prostate: Secondary | ICD-10-CM | POA: Diagnosis not present

## 2022-07-31 ENCOUNTER — Other Ambulatory Visit: Payer: Self-pay

## 2022-07-31 ENCOUNTER — Encounter: Payer: Self-pay | Admitting: Physical Therapy

## 2022-07-31 ENCOUNTER — Ambulatory Visit: Payer: PPO | Attending: Family Medicine | Admitting: Physical Therapy

## 2022-07-31 ENCOUNTER — Ambulatory Visit: Payer: PPO | Admitting: Family Medicine

## 2022-07-31 DIAGNOSIS — M6281 Muscle weakness (generalized): Secondary | ICD-10-CM | POA: Diagnosis not present

## 2022-07-31 DIAGNOSIS — M5412 Radiculopathy, cervical region: Secondary | ICD-10-CM | POA: Diagnosis not present

## 2022-07-31 NOTE — Therapy (Signed)
OUTPATIENT PHYSICAL THERAPY CERVICAL EVALUATION   Patient Name: William [name] MRN: [id number] DOB:July 01, 1960, 62 y.o., male Today's Date: 07/31/2022  END OF SESSION:  PT End of Session - 07/31/22 0849     Visit Number 1    Date for PT Re-Evaluation 09/25/22    Authorization Type Medicare [employer]    PT Start Time 0846    PT Stop Time 0928    PT Time Calculation (min) 42 min    Activity Tolerance Patient tolerated treatment well             Past Medical History:  Diagnosis Date   Allergy    Anxiety    Chronic kidney disease    Depression    Diverticulitis    Gastroparesis    Heart murmur    Migraine headache    Mitral valve prolapse    SVT (supraventricular tachycardia)    Past Surgical History:  Procedure Laterality Date   COLONOSCOPY W/ BIOPSIES  2023   RADIOACTIVE SEED IMPLANT N/A 01/19/2022   Procedure: RADIOACTIVE SEED IMPLANT/BRACHYTHERAPY IMPLANT;  Surgeon: [name], MD;  Location: [place];  Service: Urology;  Laterality: N/A;   SPACE OAR INSTILLATION N/A 01/19/2022   Procedure: SPACE OAR INSTILLATION;  Surgeon: [name], MD;  Location: [facility];  Service: Urology;  Laterality: N/A;   Patient Active Problem List   Diagnosis Date Noted   B12 deficiency 06/08/2022   Loss of weight 06/08/2022   Genetic testing 11/10/2021   Malignant neoplasm of prostate 10/24/2021   Right calf pain 09/18/2021   Caregiver burden 10/18/2020   Arthritis of right acromioclavicular joint 02/18/2020   Right rotator cuff tear 12/18/2019   Degenerative cervical disc 05/07/2019   Left lumbar radiculopathy 10/10/2017   Migraine without aura and without status migrainosus, not intractable 06/22/2017   Vitamin D deficiency 05/28/2017   Scrotal pain 05/28/2017   Paroxysmal SVT (supraventricular tachycardia) 05/28/2017   Mild left inguinal hernia 11/23/2016   Chronic pelvic pain in male 09/28/2016   Abdominal pain, left  lower quadrant 09/28/2016   Isolated proteinuria without specific morphologic lesion 06/27/2016   Left testicular pain 06/27/2016   Acute left flank pain 06/27/2016   Epididymitis, left 06/05/2016   Pelvic floor dysfunction 04/01/2016   GERD (gastroesophageal reflux disease) 10/13/2015   Gastroparesis 10/13/2015   Intractable chronic migraine without aura and without status migrainosus 12/15/2013   Hyperlipidemia 04/04/2012   Mitral valve disease 07/15/2009   IRRITABLE BOWEL SYNDROME 07/15/2009   Palpitations 06/28/2009    PCP: [name] MD  REFERRING PROVIDER: Smith,Zachary DO  REFERRING DIAG: M54.12 cervical radiculopathy  THERAPY DIAG:  Cervical radiculopathy Rationale for Evaluation and Treatment: Rehabilitation  ONSET DATE: Jan 2024  SUBJECTIVE:  SUBJECTIVE STATEMENT: This happened months ago but it took me months to get in b/c I've had other medical and dental issues.  Lifting flat of water and housework scrubbing aggravates;  left > right upper trap region pain; was having left UE symptoms tingling in hand, still mildly there;   stiffness after being in the car 1 hour;   I had a left tooth abscess.  Dr. Tamala Julian said to arms in peripheral vision when exercising.   Hand dominance: Right  PERTINENT HISTORY:  prostate cancer 2023  Allergy      Anxiety     Chronic kidney disease     Depression     Diverticulitis     Gastroparesis     Heart murmur     Migraine headache     Mitral valve prolapse     SVT (supraventricular tachycardia)     PAIN:  PAIN:  Are you having pain? Yes NPRS scale: 4/10 Pain location: left > right neck upper trap region, left UE mild paresthesia Aggravating factors: lifting a flat of water,scrubbing for housework, turning head for  driving Relieving factors: avoiding things that aggravate  PRECAUTIONS: None  WEIGHT BEARING RESTRICTIONS: No  FALLS:  Has patient fallen in last 6 months? No  LIVING ENVIRONMENT: Lives with: lives with their family Lives in: House/apartment   OCCUPATION: unable to work  PLOF: Independent  PATIENT GOALS: pain relief, be able to go to the gym  OBJECTIVE:   DIAGNOSTIC FINDINGS:  FINDINGS: No radiographic evidence of cervical spine fracture. There is multilevel degenerative disc disease moderate at C4-C5 and mild-to-moderate at C3-C4 and C5-C6, and with posterior endplate spurring. There is mild multilevel facet arthropathy. No prevertebral soft tissue swelling.   IMPRESSION: Mild to moderate multilevel degenerative disc disease from C3 through C6.   Mild multilevel facet arthropathy.  PATIENT SURVEYS:  FOTO 37%  COGNITION: Overall cognitive status: Within functional limits for tasks assessed   PALPATION: Tender points left upper trap and levator scap muscles   CERVICAL ROM:   Active ROM A/PROM (deg) eval  Flexion 55  Extension 62 pain  Right lateral flexion 25 pain  Left lateral flexion 26 pain  Right rotation 45  Left rotation 30   (Blank rows = not tested)  UPPER EXTREMITY [id number] and abduction ROM WFLS; pain and limited bil shoulder external rotation to base of head, internal rotation to T8 bil painful  UPPER EXTREMITY MMT: deep cervical flexors and glenohumeral muscles grossly 4/5;  middle and lower traps 4-/5  TODAY'S TREATMENT:                                                                                                                              DATE: 4/2   PATIENT EDUCATION:  Given handout on anti inflammatory strategies:  meditation, exercise, diet, sleep Education details: Educated patient on anatomy and physiology of current symptoms, prognosis, plan of care as well as initial self care strategies to  promote recovery  Person  educated: Patient Education method: Explanation Education comprehension: verbalized understanding  HOME EXERCISE PROGRAM: To be started  ASSESSMENT:  CLINICAL IMPRESSION: Patient is a 62 y.o. male who was seen today for physical therapy evaluation and treatment for cervical radiculopathy. The patient would benefit from skilled PT to address decreased cervical range of motion, correct muscle strength asymmetries and weakness in deep cervical flexors and extensors, improve glenohumeral and scapular muscle strength including and scapular retractor and depressors and address pain levels.  All affect patient's ability to perform ADLS including driving, housework and lifting/carrying items like groceries.     OBJECTIVE IMPAIRMENTS: decreased activity tolerance, decreased ROM, decreased strength, increased fascial restrictions, impaired perceived functional ability, impaired UE functional use, and pain.   ACTIVITY LIMITATIONS: carrying, lifting, sleeping, and caring for others  PARTICIPATION LIMITATIONS: cleaning, driving, and community activity  PERSONAL FACTORS: 1-2 comorbidities: history of spinal pain; prostate CA; anxiety  are also affecting patient's functional outcome.   REHAB POTENTIAL: Good  CLINICAL DECISION MAKING: Stable/uncomplicated  EVALUATION COMPLEXITY: Low   GOALS: Goals reviewed with patient? Yes  SHORT TERM GOALS: Target date: 08/28/2022    The patient will demonstrate knowledge of basic self care strategies and exercises to promote healing  Baseline:  Goal status: INITIAL  2.  Improved cervical rotation to 50 degrees bil needed for driving Baseline:  Goal status: INITIAL  3.  Shoulder external rotation to C7 needed for dressing/grooming Baseline:  Goal status: INITIAL  4.  The patient will report a 25% improvement in UE use with housework (scrubbing) and lifting groceries Baseline:  Goal status: INITIAL   LONG TERM GOALS: Target date: 09/25/2022    The  patient will be independent in a safe self progression of a home exercise program to promote further recovery of function  Baseline:  Goal status: INITIAL  2.  The patient will report a 60% improvement in pain levels with functional activities which are currently difficult including lifting flats of water/groceries and housework (scrubbing) Baseline:  Goal status: INITIAL  3.  Sidebending ROM improved to 40 degrees needed for driving Baseline:  Goal status: INITIAL  4.  The patient will have grossly 4+/5 strength needed to lift and lower a 5-8# object from a high shelf  Baseline:  Goal status: INITIAL  5.  FOTO score improved to 54% indicating improved function with less pain Baseline:  Goal status: INITIAL    PLAN:  PT FREQUENCY: 1-2x/week  PT DURATION: 8 weeks  PLANNED INTERVENTIONS: Therapeutic exercises, Therapeutic activity, Neuromuscular re-education, Patient/Family education, Self Care, Joint mobilization, Aquatic Therapy, Dry Needling, Electrical stimulation, Spinal mobilization, Cryotherapy, Moist heat, Taping, Traction, Ultrasound, Ionotophoresis 4mg /ml Dexamethasone, Manual therapy, and Re-evaluation  PLAN FOR NEXT SESSION: pt requests 1x/week initially secondary to multiple appts;  DN to upper trap and levator scap;  start home stretching ex program;  UBE; progressive strengthening;  encourage anti-inflammatory strategies  Ruben Im, PT 07/31/22 8:30 PM Phone: [phone] Fax: [phone]

## 2022-07-31 NOTE — Patient Instructions (Signed)
"  TAKE YOUR MEDS"  ? ?Acronym to reduce the body's inflammatory response ? ?1) MEDITATION: Mindfulness and meditation have been proven to reduce the brain's over-sensitization and stimulation ? ? ?2) EXERCISE: Activity pumps the muscles which in return washes those inflammatory cells away ? ? ?3)DIET: Eat healthy foods especially plants. Stay away from processed foods, especially sugar which has been proven to INCREASE pain levels! ? ? ?4)SLEEP: Prioritize sleep right now.  Avoid screens for the 30 minutes before bedtime.  A cool, dark room is best for sleeping.  Your body needs quality sleep for healing.   ?

## 2022-08-03 DIAGNOSIS — I471 Supraventricular tachycardia, unspecified: Secondary | ICD-10-CM | POA: Diagnosis not present

## 2022-08-03 DIAGNOSIS — C61 Malignant neoplasm of prostate: Secondary | ICD-10-CM | POA: Diagnosis not present

## 2022-08-03 DIAGNOSIS — R5382 Chronic fatigue, unspecified: Secondary | ICD-10-CM | POA: Diagnosis not present

## 2022-08-03 DIAGNOSIS — F411 Generalized anxiety disorder: Secondary | ICD-10-CM | POA: Diagnosis not present

## 2022-08-03 NOTE — Progress Notes (Unsigned)
[name] [address] [place] [address] Phone: [phone] Subjective:   William [name], am serving as a scribe for Dr. Antoine Primas.  I'm seeing this patient by the request  of:  Farris Has, MD  CC: neck pain follow up   CXK:[id number]  06/08/2022 Still has significant burden from taking care of his mother.  Discussed icing regimen and home exercises, discussed different behavioral counseling that I think will be beneficial.  Do believe that patient also has underlying depression that is concerning.  No suicidal or homicidal ideation.  Referred patient to behavioral health for further evaluation and treatment.     Injection given today and will do another 1 in 4 weeks with the idea of potentially checking B12 again in 2 months      Patient likely is having more some degenerative disc disease of the cervical spine causing more of the tightness in the upper back.  We discussed formal physical therapy and will be referred appropriately.  Has responded well to physical therapy previously and hopefully this will do well.  Patient has lost a lot of weight and we discussed potentially workup for that.  Feels like he has been given a good amount of workup at the moment by primary care.  We will hold at this moment.  Discussed icing regimen and home exercises, which activities to do and which ones to avoid.  Follow-up with me again in 6 to 8 weeks      Update 08/06/2022 William [name] is a 62 y.o. male coming in with complaint of cervical DDD. Patient states that he started PT last week. Continued pain for neck and back pain. Constant tingling in L hand with varying intensities.   B12 injection today if possible.           Past Medical History:  Diagnosis Date   Allergy    Anxiety    Chronic kidney disease    Depression    Diverticulitis    Gastroparesis    Heart murmur    Migraine headache    Mitral valve prolapse    SVT  (supraventricular tachycardia)    Past Surgical History:  Procedure Laterality Date   COLONOSCOPY W/ BIOPSIES  2023   RADIOACTIVE SEED IMPLANT N/A 01/19/2022   Procedure: RADIOACTIVE SEED IMPLANT/BRACHYTHERAPY IMPLANT;  Surgeon: Bjorn Pippin, MD;  Location: [facility];  Service: Urology;  Laterality: N/A;   SPACE OAR INSTILLATION N/A 01/19/2022   Procedure: SPACE OAR INSTILLATION;  Surgeon: Bjorn Pippin, MD;  Location: [facility];  Service: Urology;  Laterality: N/A;   Social History   Socioeconomic History   Marital status: Married    Spouse name: Not on file   Number of children: 0   Years of education: Not on file   Highest education level: Bachelor's degree (e.g., BA, AB, BS)  Occupational History   Not on file  Tobacco Use   Smoking status: Never   Smokeless tobacco: Never  Vaping Use   Vaping Use: Never used  Substance and Sexual Activity   Alcohol use: No    Alcohol/week: 0.0 standard drinks of alcohol   Drug use: No   Sexual activity: Yes    Partners: Female  Other Topics Concern   Not on file  Social History Narrative   Pt is married lives with spouse in 1 story apartment he has No children   BS degree- he is right handed   Drinks soda  Ginger ale, no coffee no tea   Right handed   Social Determinants of Health   Financial Resource Strain: Low Risk  (10/25/2021)   Overall Financial Resource Strain (CARDIA)    Difficulty of Paying Living Expenses: Not hard at all  Food Insecurity: No Food Insecurity (10/25/2021)   Hunger Vital Sign    Worried About Running Out of Food in the Last Year: Never true    Ran Out of Food in the Last Year: Never true  Transportation Needs: No Transportation Needs (10/25/2021)   PRAPARE - Administrator, Civil Service (Medical): No    Lack of Transportation (Non-Medical): No  Physical Activity: Not on file  Stress: Stress Concern Present (10/25/2021)   [employer] of Occupational Health -  Occupational Stress Questionnaire    Feeling of Stress : Rather much  Social Connections: Not on file   Allergies  Allergen Reactions   Erythromycin Anaphylaxis and Nausea And Vomiting   Nalbuphine Other (See Comments)    States loss of consciousness    Cefdinir Diarrhea, Nausea And Vomiting and Rash   Cephalexin Diarrhea and Nausea And Vomiting   Cephalosporins Diarrhea and Nausea And Vomiting   Hydrocodone-Acetaminophen Nausea And Vomiting   Ketorolac Nausea And Vomiting    Other reaction(s): upset stomach   Prochlorperazine Edisylate Other (See Comments)    uncontrollable shake - tremor    Restasis [Cyclosporine] Other (See Comments)    migraines   Tyloxapol Swelling   Bacitracin-Polymyxin B     Other reaction(s): rash gets worse Other reaction(s): rash gets worse   Codeine Other (See Comments)    Other reaction(s): stomach upset   Nalbuphine Hcl     Other reaction(s): stomach upset   Prochlorperazine     Other reaction(s): tremors/vomiting   Propranolol Diarrhea   Topamax [Topiramate] Diarrhea and Nausea And Vomiting   Zetia [Ezetimibe] Diarrhea   Cefdinir Diarrhea, Nausea And Vomiting and Rash    Other reaction(s): too stronge   Doxycycline Hyclate Nausea Only and Other (See Comments)    abd pain   Iodinated Contrast Media Other (See Comments)    Lightheaded, flushed feeling (explained to patient these are normal side effects of IV iodinated contrast, not allergies, but he wanted this noted in the epic; he tolerated epidural steroid injections w/ contrast w/o difficulty)   Ketoconazole Itching   Ketorolac Tromethamine Rash    REACTION: Reaction not known   Latex Itching and Rash   Neosporin [Neomycin-Bacitracin Zn-Polymyx] Rash   Oxycodone-Acetaminophen Other (See Comments)    Tylox caused constipation   Prednisone Nausea And Vomiting    Oral route, only Other reaction(s): stomach spasms   Family History  Problem Relation Age of Onset   Hypertension Mother     Lung cancer Father 59 - 50   Hypertension Brother    Kidney failure Maternal Grandmother    Prostate cancer Neg Hx    Kidney cancer Neg Hx      Current Outpatient Medications (Cardiovascular):    diltiazem (CARDIZEM CD) 120 MG 24 hr capsule, TAKE ONE CAPSULE BY MOUTH DAILY   diltiazem (CARDIZEM) 30 MG tablet, TAKE (1) TABLET AS NEEDED FOR PALPITATIONS  Current Outpatient Medications (Respiratory):    fluticasone (FLONASE) 50 MCG/ACT nasal spray, Place 2 sprays into both nostrils daily.  Current Outpatient Medications (Analgesics):    Galcanezumab-gnlm (EMGALITY) 120 MG/ML SOAJ, Inject 120 mg into the skin every 28 (twenty-eight) days.   Rimegepant Sulfate (NURTEC) 75 MG TBDP, Take 75 mg  by mouth as needed for up to 1 dose (Maximum 1 tablet in 24 hours.).   Current Outpatient Medications (Other):    baclofen (LIORESAL) 10 MG tablet, Take 0.5 tablets (5 mg total) by mouth at bedtime as needed for muscle spasms.   clobetasol (TEMOVATE) 0.05 % external solution, Apply 1 Application topically 2 (two) times daily. Apply to scalp x 2 weeks prn itch   clobetasol cream (TEMOVATE) 0.05 %, Apply 1 Application topically 2 (two) times daily. To hands and feet x 2 weeks prn   hydrOXYzine (ATARAX) 50 MG tablet, Take 25-50 mg by mouth 2 (two) times daily as needed for itching.   metoCLOPramide (REGLAN) 5 MG tablet, Take 5 mg by mouth as needed.   mirtazapine (REMERON) 15 MG tablet, Take 1 tablet (15 mg total) by mouth at bedtime.   ondansetron (ZOFRAN-ODT) 8 MG disintegrating tablet, Take 8 mg by mouth 3 (three) times daily as needed.   polyethylene glycol powder (GLYCOLAX/MIRALAX) 17 GM/SCOOP powder, Take 17 g by mouth as needed for moderate constipation.   Reviewed prior external information including notes and imaging from  primary care provider As well as notes that were available from care everywhere and other healthcare systems.  Reviewed outside PSA that does show a downtrending from 5.21,  to 1.6, 10.99  Past medical history, social, surgical and family history all reviewed in electronic medical record.  No pertanent information unless stated regarding to the chief complaint.   Review of Systems:  No headache, visual changes, nausea, vomiting, diarrhea, constipation, dizziness, abdominal pain, skin rash, fevers, chills, night sweats, weight loss, swollen lymph nodes, body aches, joint swelling, chest pain, shortness of breath, . POSITIVE muscle aches, mood changes  Objective  Blood pressure 112/74, pulse 64, height 5\' 10"  (1.778 m), weight 136 lb (61.7 kg), SpO2 98 %.   General: No apparent distress alert and oriented x3 mood and affect normal, dressed appropriately.  HEENT: Pupils equal, extraocular movements intact  Respiratory: Patient's speak in full sentences and does not appear short of breath  Cardiovascular: No lower extremity edema, non tender, no erythema  Patient does still look minorly cachectic.  Patient's low back does have some loss of lordosis.  Sitting relatively comfortably.    Impression and Recommendations:    The above documentation has been reviewed and is accurate and complete Judi SaaZachary M Zan Orlick, DO

## 2022-08-06 ENCOUNTER — Ambulatory Visit (INDEPENDENT_AMBULATORY_CARE_PROVIDER_SITE_OTHER): Payer: PPO | Admitting: Family Medicine

## 2022-08-06 VITALS — BP 112/74 | HR 64 | Ht 70.0 in | Wt 136.0 lb

## 2022-08-06 DIAGNOSIS — E538 Deficiency of other specified B group vitamins: Secondary | ICD-10-CM | POA: Diagnosis not present

## 2022-08-06 DIAGNOSIS — C61 Malignant neoplasm of prostate: Secondary | ICD-10-CM

## 2022-08-06 MED ORDER — CYANOCOBALAMIN 1000 MCG/ML IJ SOLN
1000.0000 ug | Freq: Once | INTRAMUSCULAR | Status: AC
  Administered 2022-08-06: 1000 ug via INTRAMUSCULAR

## 2022-08-06 NOTE — Patient Instructions (Signed)
Good to see you It's ok live your life Do not do fish oil Tx seems to be working Take remeron Follow up with urology See me in 3 months

## 2022-08-06 NOTE — Assessment & Plan Note (Signed)
B12 injection given today, will do monthly injections and will see me again in 2 months otherwise.

## 2022-08-06 NOTE — Assessment & Plan Note (Signed)
Discussed with patient to follow-up with oncology as well as patient's urologist.  Has been diagnosed with more of a stage IIc.  Discussed with patient that at the moment he does have good downtrending PSA which she should be helpful.  I still feel that there is some underlying depression and was referred to counseling.  Encouraged him to keep a clear diet and to become more active.  We discussed this at great length.  Total time with patient 34 minutes follow-up with me again in 2 to 3 months

## 2022-08-07 ENCOUNTER — Ambulatory Visit: Payer: PPO | Admitting: Physical Therapy

## 2022-08-08 DIAGNOSIS — N401 Enlarged prostate with lower urinary tract symptoms: Secondary | ICD-10-CM | POA: Diagnosis not present

## 2022-08-08 DIAGNOSIS — R300 Dysuria: Secondary | ICD-10-CM | POA: Diagnosis not present

## 2022-08-08 DIAGNOSIS — C61 Malignant neoplasm of prostate: Secondary | ICD-10-CM | POA: Diagnosis not present

## 2022-08-08 DIAGNOSIS — R351 Nocturia: Secondary | ICD-10-CM | POA: Diagnosis not present

## 2022-08-08 DIAGNOSIS — R102 Pelvic and perineal pain: Secondary | ICD-10-CM | POA: Diagnosis not present

## 2022-08-09 ENCOUNTER — Ambulatory Visit: Payer: PPO | Admitting: Physical Therapy

## 2022-08-14 ENCOUNTER — Encounter: Payer: Self-pay | Admitting: Physical Therapy

## 2022-08-21 ENCOUNTER — Ambulatory Visit: Payer: PPO | Admitting: Physical Therapy

## 2022-08-21 DIAGNOSIS — M5412 Radiculopathy, cervical region: Secondary | ICD-10-CM | POA: Diagnosis not present

## 2022-08-21 DIAGNOSIS — M6281 Muscle weakness (generalized): Secondary | ICD-10-CM

## 2022-08-21 NOTE — Patient Instructions (Signed)
RE-ALIGNMENT ROUTINE EXERCISES BASIC FOR POSTURAL CORRECTION   RE-ALIGNMENT Tips BENEFITS: 1.It helps to re-align the curves of the back and improve standing posture. 2.It allows the back muscles to rest and strengthen in preparation for more activity. FREQUENCY: Daily, even after weeks, months and years of more advanced exercises. START: 1.All exercises start in the same position: lying on the back, arms resting on the supporting surface, palms up and slightly away from the body, backs of hands down, knees bent, feet flat. 2.The head, neck, arms, and legs are supported according to specific instructions of your therapist. Copyright  VHI. All rights reserved.    1. Decompression Exercise: Basic.   Takes compression off the vertebral bodies; increases tolerance for lying on the back; helps relieve back pain   Lie on back on firm surface, knees bent, feet flat, arms turned up, out to sides (~35 degrees). Head neck and arms supported as necessary. Time _5-15__ minutes. Surface: floor     2. Shoulder Press  Strengthens upper back extensors and scapular retractors.   Press both shoulders down. Hold _2-3__ seconds. Repeat _3-5__ times. Surface: floor        3. Head Press With Chin Tuck  Strengthens neck extensors   Tuck chin SLIGHTLY toward chest, keep mouth closed. Feel weight on back of head. Increase weight by pressing head down. Hold _2-3__ seconds. Relax. Repeat 3-5___ times. Surface: floor     4. Leg Lengthener: stretches quadratus lumborum and hip flexors.  Strengthens quads and ankle dorsiflexors.  Leg Lengthener: Full    Straighten one leg. Pull toes AND forefoot toward knee, extend heel. Lengthen leg by pulling pelvis away from ribs. Hold __5_ seconds. Relax. Repeat 1 time. Re-bend knee. Do other leg. Each leg __5_ times. Surface: floor    Leg Lengthener / Leg Press Combo: Single Leg    Straighten one leg down to floor. Pull toes AND forefoot toward knee;  extend heel. Lengthen leg by pulling pelvis away from ribs. Press leg down. DO NOT BEND KNEE. Hold _5__ seconds. Relax leg. Repeat exercise 1 time. Relax leg. Re-bend knee. Repeat with other leg. Do 5 times

## 2022-08-21 NOTE — Therapy (Signed)
OUTPATIENT PHYSICAL THERAPY CERVICAL PROGRESS NOTE  Patient Name: William [name] MRN: [id number] DOB:[birth year], 62 y.o., male Today's Date: 08/21/2022  END OF SESSION:  PT End of Session - 08/21/22 0849     Visit Number 2    Date for PT Re-Evaluation 09/25/22    Authorization Type Medicare [employer]    Progress Note Due on Visit 10    PT Start Time 0847    PT Stop Time 0930    PT Time Calculation (min) 43 min    Activity Tolerance Patient tolerated treatment well             Past Medical History:  Diagnosis Date   Allergy    Anxiety    Chronic kidney disease    Depression    Diverticulitis    Gastroparesis    Heart murmur    Migraine headache    Mitral valve prolapse    SVT (supraventricular tachycardia)    Past Surgical History:  Procedure Laterality Date   COLONOSCOPY W/ BIOPSIES  2023   RADIOACTIVE SEED IMPLANT N/A 01/19/2022   Procedure: RADIOACTIVE SEED IMPLANT/BRACHYTHERAPY IMPLANT;  Surgeon: Bjorn Pippin, MD;  Location: [facility];  Service: Urology;  Laterality: N/A;   SPACE OAR INSTILLATION N/A 01/19/2022   Procedure: SPACE OAR INSTILLATION;  Surgeon: Bjorn Pippin, MD;  Location: [facility];  Service: Urology;  Laterality: N/A;   Patient Active Problem List   Diagnosis Date Noted   B12 deficiency 06/08/2022   Loss of weight 06/08/2022   Genetic testing 11/10/2021   Malignant neoplasm of prostate 10/24/2021   Right calf pain 09/18/2021   Caregiver burden 10/18/2020   Arthritis of right acromioclavicular joint 02/18/2020   Right rotator cuff tear 12/18/2019   Degenerative cervical disc 05/07/2019   Left lumbar radiculopathy 10/10/2017   Migraine without aura and without status migrainosus, not intractable 06/22/2017   Vitamin D deficiency 05/28/2017   Scrotal pain 05/28/2017   Paroxysmal SVT (supraventricular tachycardia) 05/28/2017   Mild left inguinal hernia 11/23/2016   Chronic pelvic pain in male  09/28/2016   Abdominal pain, left lower quadrant 09/28/2016   Isolated proteinuria without specific morphologic lesion 06/27/2016   Left testicular pain 06/27/2016   Acute left flank pain 06/27/2016   Epididymitis, left 06/05/2016   Pelvic floor dysfunction 04/01/2016   GERD (gastroesophageal reflux disease) 10/13/2015   Gastroparesis 10/13/2015   Intractable chronic migraine without aura and without status migrainosus 12/15/2013   Hyperlipidemia 04/04/2012   Mitral valve disease 07/15/2009   IRRITABLE BOWEL SYNDROME 07/15/2009   Palpitations 06/28/2009    PCP: Farris Has MD  REFERRING PROVIDER: Smith,Zachary DO  REFERRING DIAG: M54.12 cervical radiculopathy  THERAPY DIAG:  Cervical radiculopathy Rationale for Evaluation and Treatment: Rehabilitation  ONSET DATE: Jan 2024  SUBJECTIVE:  SUBJECTIVE STATEMENT: Stiffness in left low back after being in the car.  No burning in the hand.  Upper trap tightness went away.  I'm just low energy, I give out.    Hand dominance: Right  PERTINENT HISTORY:  prostate cancer 2023  Allergy      Anxiety     Chronic kidney disease     Depression     Diverticulitis     Gastroparesis     Heart murmur     Migraine headache     Mitral valve prolapse     SVT (supraventricular tachycardia)     PAIN:  PAIN:  Are you having pain? Yes NPRS scale: 1-2/10 Pain location: lower left back Aggravating factors: lifting a flat of water,scrubbing for housework, turning head for driving Relieving factors: avoiding things that aggravate  PRECAUTIONS: None  WEIGHT BEARING RESTRICTIONS: No  FALLS:  Has patient fallen in last 6 months? No  LIVING ENVIRONMENT: Lives with: lives with their family Lives in: House/apartment   OCCUPATION: unable to  work  PLOF: Independent  PATIENT GOALS: pain relief, be able to go to the gym  OBJECTIVE:   DIAGNOSTIC FINDINGS:  FINDINGS: No radiographic evidence of cervical spine fracture. There is multilevel degenerative disc disease moderate at C4-C5 and mild-to-moderate at C3-C4 and C5-C6, and with posterior endplate spurring. There is mild multilevel facet arthropathy. No prevertebral soft tissue swelling.   IMPRESSION: Mild to moderate multilevel degenerative disc disease from C3 through C6.   Mild multilevel facet arthropathy.  PATIENT SURVEYS:  FOTO 37%  COGNITION: Overall cognitive status: Within functional limits for tasks assessed   PALPATION: Tender points left upper trap and levator scap muscles   CERVICAL ROM:   Active ROM A/PROM (deg) eval  Flexion 55  Extension 62 pain  Right lateral flexion 25 pain  Left lateral flexion 26 pain  Right rotation 45  Left rotation 30   (Blank rows = not tested)  UPPER EXTREMITY [id number] and abduction ROM WFLS; pain and limited bil shoulder external rotation to base of head, internal rotation to T8 bil painful  UPPER EXTREMITY MMT: deep cervical flexors and glenohumeral muscles grossly 4/5;  middle and lower traps 4-/5  TODAY'S TREATMENT:                                                                                                                              DATE: 4/23 UBE 5 min while discuss status Supine decompression series 5 sec holds, 5x each HEP Supine foam roll cervical Melt method: rotation, circles, nods 5x each Seated foam roll thoracic extension 10x Wall foam roll UE alternating shoulder elevation 10x Facing wall foam roll bil shoulder elevation 10x  2# front shoulder raises 10x 2# lateral shoulder raises 10x 5# shoulder shrugs 10x  PATIENT EDUCATION:  Given handout on anti inflammatory strategies:  meditation, exercise, diet, sleep; gave foam roll info Education details: Educated patient on anatomy and  physiology of current  symptoms, prognosis, plan of care as well as initial self care strategies to promote recovery  Person educated: Patient Education method: Explanation Education comprehension: verbalized understanding  HOME EXERCISE PROGRAM: Access Code: [email or web address] URL: [email or web address] Date: 08/21/2022 Prepared by: Lavinia Sharps  Exercises - Seated Thoracic Lumbar Extension with Pectoralis Stretch  - 1 x daily - 7 x weekly - 1 sets - 10 reps - Alternating Shoulder Flexion at Wall with Foam Roller  - 1 x daily - 7 x weekly - 1 sets - 10 reps - Serratus Activation at Wall with Foam Roll  - 1 x daily - 7 x weekly - 1 sets - 10 reps Patient instructions decompression series ASSESSMENT:  CLINICAL IMPRESSION: The patient responds well to low level decompressive and postural ex's in supine and seated positions.  He is also able to perform light resistive strengthening ex's without an increase in pain.  Therapist monitoring response throughout treatment session.  He reports an improvement in energy level at the end of session.  No peripheral symptoms and pain level remains very low.        OBJECTIVE IMPAIRMENTS: decreased activity tolerance, decreased ROM, decreased strength, increased fascial restrictions, impaired perceived functional ability, impaired UE functional use, and pain.   ACTIVITY LIMITATIONS: carrying, lifting, sleeping, and caring for others  PARTICIPATION LIMITATIONS: cleaning, driving, and community activity  PERSONAL FACTORS: 1-2 comorbidities: history of spinal pain; prostate CA; anxiety  are also affecting patient's functional outcome.   REHAB POTENTIAL: Good  CLINICAL DECISION MAKING: Stable/uncomplicated  EVALUATION COMPLEXITY: Low   GOALS: Goals reviewed with patient? Yes  SHORT TERM GOALS: Target date: 08/28/2022    The patient will demonstrate knowledge of basic self care strategies and exercises to promote healing  Baseline:  Goal  status: INITIAL  2.  Improved cervical rotation to 50 degrees bil needed for driving Baseline:  Goal status: INITIAL  3.  Shoulder external rotation to C7 needed for dressing/grooming Baseline:  Goal status: INITIAL  4.  The patient will report a 25% improvement in UE use with housework (scrubbing) and lifting groceries Baseline:  Goal status: INITIAL   LONG TERM GOALS: Target date: 09/25/2022    The patient will be independent in a safe self progression of a home exercise program to promote further recovery of function  Baseline:  Goal status: INITIAL  2.  The patient will report a 60% improvement in pain levels with functional activities which are currently difficult including lifting flats of water/groceries and housework (scrubbing) Baseline:  Goal status: INITIAL  3.  Sidebending ROM improved to 40 degrees needed for driving Baseline:  Goal status: INITIAL  4.  The patient will have grossly 4+/5 strength needed to lift and lower a 5-8# object from a high shelf  Baseline:  Goal status: INITIAL  5.  FOTO score improved to 54% indicating improved function with less pain Baseline:  Goal status: INITIAL    PLAN:  PT FREQUENCY: 1-2x/week  PT DURATION: 8 weeks  PLANNED INTERVENTIONS: Therapeutic exercises, Therapeutic activity, Neuromuscular re-education, Patient/Family education, Self Care, Joint mobilization, Aquatic Therapy, Dry Needling, Electrical stimulation, Spinal mobilization, Cryotherapy, Moist heat, Taping, Traction, Ultrasound, Ionotophoresis 4mg /ml Dexamethasone, Manual therapy, and Re-evaluation  PLAN FOR NEXT SESSION: pt requests 1x/week initially secondary to multiple appts;  DN to upper trap and levator scap; home ex program progression;  UBE; progressive strengthening;  encourage anti-inflammatory strategies   Lavinia Sharps, PT 08/21/22 9:53 AM Phone: [phone] Fax: [phone]

## 2022-08-23 ENCOUNTER — Ambulatory Visit: Payer: PPO | Admitting: Physical Therapy

## 2022-08-28 ENCOUNTER — Ambulatory Visit: Payer: PPO | Admitting: Physical Therapy

## 2022-08-30 ENCOUNTER — Encounter: Payer: PPO | Admitting: Physical Therapy

## 2022-09-03 ENCOUNTER — Ambulatory Visit: Payer: PPO

## 2022-09-04 ENCOUNTER — Ambulatory Visit: Payer: PPO | Attending: Family Medicine | Admitting: Physical Therapy

## 2022-09-04 DIAGNOSIS — M6281 Muscle weakness (generalized): Secondary | ICD-10-CM | POA: Diagnosis not present

## 2022-09-04 DIAGNOSIS — M5412 Radiculopathy, cervical region: Secondary | ICD-10-CM | POA: Diagnosis not present

## 2022-09-04 NOTE — Therapy (Signed)
OUTPATIENT PHYSICAL THERAPY CERVICAL PROGRESS NOTE  Patient Name: William [name] MRN: [id number] DOB:03/04/1961, 62 y.o., male Today's Date: 09/04/2022  END OF SESSION:  PT End of Session - 09/04/22 0929     Visit Number 3    Date for PT Re-Evaluation 09/25/22    Authorization Type Medicare [employer]    Progress Note Due on Visit 10    PT Start Time 0930    PT Stop Time 1013    PT Time Calculation (min) 43 min    Activity Tolerance Patient tolerated treatment well             Past Medical History:  Diagnosis Date   Allergy    Anxiety    Chronic kidney disease    Depression    Diverticulitis    Gastroparesis    Heart murmur    Migraine headache    Mitral valve prolapse    SVT (supraventricular tachycardia)    Past Surgical History:  Procedure Laterality Date   COLONOSCOPY W/ BIOPSIES  2023   RADIOACTIVE SEED IMPLANT N/A 01/19/2022   Procedure: RADIOACTIVE SEED IMPLANT/BRACHYTHERAPY IMPLANT;  Surgeon: Bjorn Pippin, MD;  Location: [facility];  Service: Urology;  Laterality: N/A;   SPACE OAR INSTILLATION N/A 01/19/2022   Procedure: SPACE OAR INSTILLATION;  Surgeon: Bjorn Pippin, MD;  Location: [facility];  Service: Urology;  Laterality: N/A;   Patient Active Problem List   Diagnosis Date Noted   B12 deficiency 06/08/2022   Loss of weight 06/08/2022   Genetic testing 11/10/2021   Malignant neoplasm of prostate (HCC) 10/24/2021   Right calf pain 09/18/2021   Caregiver burden 10/18/2020   Arthritis of right acromioclavicular joint 02/18/2020   Right rotator cuff tear 12/18/2019   Degenerative cervical disc 05/07/2019   Left lumbar radiculopathy 10/10/2017   Migraine without aura and without status migrainosus, not intractable 06/22/2017   Vitamin D deficiency 05/28/2017   Scrotal pain 05/28/2017   Paroxysmal SVT (supraventricular tachycardia) 05/28/2017   Mild left inguinal hernia 11/23/2016   Chronic pelvic pain in  male 09/28/2016   Abdominal pain, left lower quadrant 09/28/2016   Isolated proteinuria without specific morphologic lesion 06/27/2016   Left testicular pain 06/27/2016   Acute left flank pain 06/27/2016   Epididymitis, left 06/05/2016   Pelvic floor dysfunction 04/01/2016   GERD (gastroesophageal reflux disease) 10/13/2015   Gastroparesis 10/13/2015   Intractable chronic migraine without aura and without status migrainosus 12/15/2013   Hyperlipidemia 04/04/2012   Mitral valve disease 07/15/2009   IRRITABLE BOWEL SYNDROME 07/15/2009   Palpitations 06/28/2009    PCP: Farris Has MD  REFERRING PROVIDER: Smith,Zachary DO  REFERRING DIAG: M54.12 cervical radiculopathy  THERAPY DIAG:  Cervical radiculopathy Rationale for Evaluation and Treatment: Rehabilitation  ONSET DATE: Jan 2024  SUBJECTIVE:  SUBJECTIVE STATEMENT: I'm still low energy.  4/10.  I might order that foam roll from [employer].    Hand dominance: Right  PERTINENT HISTORY:  prostate cancer 2023  Allergy      Anxiety     Chronic kidney disease     Depression     Diverticulitis     Gastroparesis     Heart murmur     Migraine headache     Mitral valve prolapse     SVT (supraventricular tachycardia)     PAIN:  PAIN:  Are you having pain? Yes NPRS scale: 4/10 Pain location: lower left back Aggravating factors: lifting a flat of water,scrubbing for housework, turning head for driving Relieving factors: avoiding things that aggravate  PRECAUTIONS: None  WEIGHT BEARING RESTRICTIONS: No  FALLS:  Has patient fallen in last 6 months? No  LIVING ENVIRONMENT: Lives with: lives with their family Lives in: House/apartment   OCCUPATION: unable to work  PLOF: Independent  PATIENT GOALS: pain relief, be able to  go to the gym  OBJECTIVE:   DIAGNOSTIC FINDINGS:  FINDINGS: No radiographic evidence of cervical spine fracture. There is multilevel degenerative disc disease moderate at C4-C5 and mild-to-moderate at C3-C4 and C5-C6, and with posterior endplate spurring. There is mild multilevel facet arthropathy. No prevertebral soft tissue swelling.   IMPRESSION: Mild to moderate multilevel degenerative disc disease from C3 through C6.   Mild multilevel facet arthropathy.  PATIENT SURVEYS:  FOTO 37%  COGNITION: Overall cognitive status: Within functional limits for tasks assessed   PALPATION: Tender points left upper trap and levator scap muscles   CERVICAL ROM:   Active ROM A/PROM (deg) eval 5/7  Flexion 55 62  Extension 62 pain 43 mild pain  Right lateral flexion 25 pain 24 tight  Left lateral flexion 26 pain 36  Right rotation 45 50  Left rotation 30 47   (Blank rows = not tested)  UPPER EXTREMITY ZOX:WRUEAVW and abduction ROM WFLS; pain and limited bil shoulder external rotation to base of head, internal rotation to T8 bil painful 5/7: full shoulder ROM but with some tightness with internal rotation  UPPER EXTREMITY MMT: deep cervical flexors and glenohumeral muscles grossly 4/5;  middle and lower traps 4-/5  TODAY'S TREATMENT:     DATE: 5/7: UBE 5 min while discuss status Standing lat bar 35# 2x 15 2# front shoulder raises 10x 2# lateral shoulder raises 10x 7# shoulder shrugs 10x Rows cable 10# 15x Standing red power cord biceps curl 10x right/left Seated  red power cord sit tall (modified seated dead lift) 10x "I like this"                                                                                                                             DATE: 4/23 UBE 5 min while discuss status Supine decompression series 5 sec holds, 5x each HEP Supine foam roll cervical Melt method: rotation, circles, nods 5x each Seated foam roll  thoracic extension 10x Wall foam roll  UE alternating shoulder elevation 10x Facing wall foam roll bil shoulder elevation 10x  2# front shoulder raises 10x 2# lateral shoulder raises 10x 5# shoulder shrugs 10x  PATIENT EDUCATION:  Given handout on anti inflammatory strategies:  meditation, exercise, diet, sleep; gave foam roll info Education details: Educated patient on anatomy and physiology of current symptoms, prognosis, plan of care as well as initial self care strategies to promote recovery  Person educated: Patient Education method: Explanation Education comprehension: verbalized understanding  HOME EXERCISE PROGRAM: Access Code: [email or web address] URL: [email or web address] Date: 08/21/2022 Prepared by: Lavinia Sharps  Exercises - Seated Thoracic Lumbar Extension with Pectoralis Stretch  - 1 x daily - 7 x weekly - 1 sets - 10 reps - Alternating Shoulder Flexion at Wall with Foam Roller  - 1 x daily - 7 x weekly - 1 sets - 10 reps - Serratus Activation at Wall with Foam Roll  - 1 x daily - 7 x weekly - 1 sets - 10 reps Patient instructions decompression series ASSESSMENT:  CLINICAL IMPRESSION: Limited progress toward goals secondary to only 3 visits completed.  Patient has been dealing with high fatigue levels since his cancer diagnosis and has canceled some appointments.  Despite this fatigue he is able to progress with exercise intensity without exacerbating these symptoms or pain.  Therapist providing cues to optimized technique for optimal benefit.  Improvements noted in cervical and shoulder ROM.         OBJECTIVE IMPAIRMENTS: decreased activity tolerance, decreased ROM, decreased strength, increased fascial restrictions, impaired perceived functional ability, impaired UE functional use, and pain.   ACTIVITY LIMITATIONS: carrying, lifting, sleeping, and caring for others  PARTICIPATION LIMITATIONS: cleaning, driving, and community activity  PERSONAL FACTORS: 1-2 comorbidities: history of spinal pain;  prostate CA; anxiety  are also affecting patient's functional outcome.   REHAB POTENTIAL: Good  CLINICAL DECISION MAKING: Stable/uncomplicated  EVALUATION COMPLEXITY: Low   GOALS: Goals reviewed with patient? Yes  SHORT TERM GOALS: Target date: 08/28/2022    The patient will demonstrate knowledge of basic self care strategies and exercises to promote healing  Baseline:  Goal status: goal met 4/30  2.  Improved cervical rotation to 50 degrees bil needed for driving Baseline:  Goal status: ongoing  3.  Shoulder external rotation to C7 needed for dressing/grooming Baseline:  Goal status:met 4/30  4.  The patient will report a 25% improvement in UE use with housework (scrubbing) and lifting groceries Baseline:  Goal status: ongoing   LONG TERM GOALS: Target date: 09/25/2022    The patient will be independent in a safe self progression of a home exercise program to promote further recovery of function  Baseline:  Goal status: INITIAL  2.  The patient will report a 60% improvement in pain levels with functional activities which are currently difficult including lifting flats of water/groceries and housework (scrubbing) Baseline:  Goal status: INITIAL  3.  Sidebending ROM improved to 40 degrees needed for driving Baseline:  Goal status: INITIAL  4.  The patient will have grossly 4+/5 strength needed to lift and lower a 5-8# object from a high shelf  Baseline:  Goal status: INITIAL  5.  FOTO score improved to 54% indicating improved function with less pain Baseline:  Goal status: INITIAL    PLAN:  PT FREQUENCY: 1-2x/week  PT DURATION: 8 weeks  PLANNED INTERVENTIONS: Therapeutic exercises, Therapeutic activity, Neuromuscular re-education, Patient/Family education, Self Care, Joint mobilization, Aquatic Therapy, Dry Needling, Electrical stimulation,  Spinal mobilization, Cryotherapy, Moist heat, Taping, Traction, Ultrasound, Ionotophoresis 4mg /ml Dexamethasone,  Manual therapy, and Re-evaluation  PLAN FOR NEXT SESSION: pt requests 1x/week initially secondary to multiple appts;  DN to upper trap and levator scap; home ex program progression;  UBE; progressive strengthening;  encourage anti-inflammatory strategies Try ladder; red power cord ex's  Lavinia Sharps, PT 09/04/22 10:15 AM Phone: [phone] Fax: [phone]

## 2022-09-05 ENCOUNTER — Ambulatory Visit: Payer: PPO

## 2022-09-05 ENCOUNTER — Ambulatory Visit (INDEPENDENT_AMBULATORY_CARE_PROVIDER_SITE_OTHER): Payer: PPO

## 2022-09-05 DIAGNOSIS — E538 Deficiency of other specified B group vitamins: Secondary | ICD-10-CM

## 2022-09-05 MED ORDER — CYANOCOBALAMIN 1000 MCG/ML IJ SOLN
1000.0000 ug | Freq: Once | INTRAMUSCULAR | Status: AC
  Administered 2022-09-05: 1000 ug via INTRAMUSCULAR

## 2022-09-05 NOTE — Progress Notes (Signed)
Patient given B12 injection per Dr. Katrinka Blazing. Patient was given injection in the right deltoid and tolerated well.

## 2022-09-06 DIAGNOSIS — E559 Vitamin D deficiency, unspecified: Secondary | ICD-10-CM | POA: Diagnosis not present

## 2022-09-06 DIAGNOSIS — Z0000 Encounter for general adult medical examination without abnormal findings: Secondary | ICD-10-CM | POA: Diagnosis not present

## 2022-09-06 DIAGNOSIS — E785 Hyperlipidemia, unspecified: Secondary | ICD-10-CM | POA: Diagnosis not present

## 2022-09-06 DIAGNOSIS — F411 Generalized anxiety disorder: Secondary | ICD-10-CM | POA: Diagnosis not present

## 2022-09-06 DIAGNOSIS — L309 Dermatitis, unspecified: Secondary | ICD-10-CM | POA: Diagnosis not present

## 2022-09-06 DIAGNOSIS — J309 Allergic rhinitis, unspecified: Secondary | ICD-10-CM | POA: Diagnosis not present

## 2022-09-06 DIAGNOSIS — I471 Supraventricular tachycardia, unspecified: Secondary | ICD-10-CM | POA: Diagnosis not present

## 2022-09-07 ENCOUNTER — Ambulatory Visit: Payer: PPO

## 2022-09-11 ENCOUNTER — Encounter: Payer: PPO | Admitting: Physical Therapy

## 2022-09-11 DIAGNOSIS — Z0000 Encounter for general adult medical examination without abnormal findings: Secondary | ICD-10-CM | POA: Diagnosis not present

## 2022-09-11 DIAGNOSIS — C61 Malignant neoplasm of prostate: Secondary | ICD-10-CM | POA: Diagnosis not present

## 2022-09-11 DIAGNOSIS — E559 Vitamin D deficiency, unspecified: Secondary | ICD-10-CM | POA: Diagnosis not present

## 2022-09-11 DIAGNOSIS — E785 Hyperlipidemia, unspecified: Secondary | ICD-10-CM | POA: Diagnosis not present

## 2022-09-11 DIAGNOSIS — I471 Supraventricular tachycardia, unspecified: Secondary | ICD-10-CM | POA: Diagnosis not present

## 2022-09-12 DIAGNOSIS — Z713 Dietary counseling and surveillance: Secondary | ICD-10-CM | POA: Diagnosis not present

## 2022-09-12 DIAGNOSIS — E559 Vitamin D deficiency, unspecified: Secondary | ICD-10-CM | POA: Diagnosis not present

## 2022-09-12 DIAGNOSIS — E785 Hyperlipidemia, unspecified: Secondary | ICD-10-CM | POA: Diagnosis not present

## 2022-09-13 ENCOUNTER — Ambulatory Visit: Payer: PPO | Admitting: Physical Therapy

## 2022-09-13 DIAGNOSIS — M6281 Muscle weakness (generalized): Secondary | ICD-10-CM

## 2022-09-13 DIAGNOSIS — M5412 Radiculopathy, cervical region: Secondary | ICD-10-CM | POA: Diagnosis not present

## 2022-09-13 NOTE — Therapy (Signed)
OUTPATIENT PHYSICAL THERAPY CERVICAL PROGRESS NOTE  Patient Name: William [name] MRN: [id number] DOB:[birth year], 62 y.o., male Today's Date: 09/13/2022  END OF SESSION:  PT End of Session - 09/13/22 0932     Visit Number 4    Date for PT Re-Evaluation 09/25/22    Authorization Type Medicare [employer] advantage    Progress Note Due on Visit 10    PT Start Time 0930    PT Stop Time 1008    PT Time Calculation (min) 38 min    Activity Tolerance Patient tolerated treatment well             Past Medical History:  Diagnosis Date   Allergy    Anxiety    Chronic kidney disease    Depression    Diverticulitis    Gastroparesis    Heart murmur    Migraine headache    Mitral valve prolapse    SVT (supraventricular tachycardia)    Past Surgical History:  Procedure Laterality Date   COLONOSCOPY W/ BIOPSIES  2023   RADIOACTIVE SEED IMPLANT N/A 01/19/2022   Procedure: RADIOACTIVE SEED IMPLANT/BRACHYTHERAPY IMPLANT;  Surgeon: Bjorn Pippin, MD;  Location: [facility];  Service: Urology;  Laterality: N/A;   SPACE OAR INSTILLATION N/A 01/19/2022   Procedure: SPACE OAR INSTILLATION;  Surgeon: Bjorn Pippin, MD;  Location: [facility];  Service: Urology;  Laterality: N/A;   Patient Active Problem List   Diagnosis Date Noted   B12 deficiency 06/08/2022   Loss of weight 06/08/2022   Genetic testing 11/10/2021   Malignant neoplasm of prostate (HCC) 10/24/2021   Right calf pain 09/18/2021   Caregiver burden 10/18/2020   Arthritis of right acromioclavicular joint 02/18/2020   Right rotator cuff tear 12/18/2019   Degenerative cervical disc 05/07/2019   Left lumbar radiculopathy 10/10/2017   Migraine without aura and without status migrainosus, not intractable 06/22/2017   Vitamin D deficiency 05/28/2017   Scrotal pain 05/28/2017   Paroxysmal SVT (supraventricular tachycardia) 05/28/2017   Mild left inguinal hernia 11/23/2016   Chronic pelvic pain in  male 09/28/2016   Abdominal pain, left lower quadrant 09/28/2016   Isolated proteinuria without specific morphologic lesion 06/27/2016   Left testicular pain 06/27/2016   Acute left flank pain 06/27/2016   Epididymitis, left 06/05/2016   Pelvic floor dysfunction 04/01/2016   GERD (gastroesophageal reflux disease) 10/13/2015   Gastroparesis 10/13/2015   Intractable chronic migraine without aura and without status migrainosus 12/15/2013   Hyperlipidemia 04/04/2012   Mitral valve disease 07/15/2009   IRRITABLE BOWEL SYNDROME 07/15/2009   Palpitations 06/28/2009    PCP: Farris Has MD  REFERRING PROVIDER: Smith,Zachary DO  REFERRING DIAG: M54.12 cervical radiculopathy  THERAPY DIAG:  Cervical radiculopathy Rationale for Evaluation and Treatment: Rehabilitation  ONSET DATE: Jan 2024  SUBJECTIVE:  SUBJECTIVE STATEMENT: I'm feeling better.  I read an article that ex can help with depression.  I've got a HHPT for my mom that helps me too.      Hand dominance: Right  PERTINENT HISTORY:  prostate cancer 2023  Allergy      Anxiety     Chronic kidney disease     Depression     Diverticulitis     Gastroparesis     Heart murmur     Migraine headache     Mitral valve prolapse     SVT (supraventricular tachycardia)     PAIN:  PAIN:  Are you having pain? no NPRS scale: 0/10 Pain location: lower left back Aggravating factors: lifting a flat of water,scrubbing for housework, turning head for driving Relieving factors: avoiding things that aggravate  PRECAUTIONS: None  WEIGHT BEARING RESTRICTIONS: No  FALLS:  Has patient fallen in last 6 months? No  LIVING ENVIRONMENT: Lives with: lives with their family Lives in: House/apartment   OCCUPATION: unable to work  PLOF:  Independent  PATIENT GOALS: pain relief, be able to go to the gym  OBJECTIVE:   DIAGNOSTIC FINDINGS:  FINDINGS: No radiographic evidence of cervical spine fracture. There is multilevel degenerative disc disease moderate at C4-C5 and mild-to-moderate at C3-C4 and C5-C6, and with posterior endplate spurring. There is mild multilevel facet arthropathy. No prevertebral soft tissue swelling.   IMPRESSION: Mild to moderate multilevel degenerative disc disease from C3 through C6.   Mild multilevel facet arthropathy.  PATIENT SURVEYS:  FOTO 37%  COGNITION: Overall cognitive status: Within functional limits for tasks assessed   PALPATION: Tender points left upper trap and levator scap muscles   CERVICAL ROM:   Active ROM A/PROM (deg) eval 09-05-22  Flexion 55 62  Extension 62 pain 43 mild pain  Right lateral flexion 25 pain 24 tight  Left lateral flexion 26 pain 36  Right rotation 45 50  Left rotation 30 47   (Blank rows = not tested)  UPPER EXTREMITY ZOX:WRUEAVW and abduction ROM WFLS; pain and limited bil shoulder external rotation to base of head, internal rotation to T8 bil painful 09/05/2022: full shoulder ROM but with some tightness with internal rotation  UPPER EXTREMITY MMT: deep cervical flexors and glenohumeral muscles grossly 4/5;  middle and lower traps 4-/5  TODAY'S TREATMENT:     DATE: 5/16: UBE 5 min while discuss status Staggered stance cable single side row 10# 10x right/left 20 inch box squats holding 10# kettlebell (has to sit all the way down difficult to stop/hover above) Standing lat bar 35# 20x Wall push ups 8x; wall push up with clap 8x 6/10 RPE Seated 2# front shoulder raises 10x Seated 2# lateral shoulder raises 10x Standing 7# shoulder shrugs 10x Seated  red power cord sit tall (modified seated dead lift) 20x    DATE: 09-05-2022: UBE 5 min while discuss status Standing lat bar 35# 2x 15 2# front shoulder raises 10x 2# lateral shoulder raises 10x 7#  shoulder shrugs 10x Rows cable 10# 15x Standing red power cord biceps curl 10x right/left Seated  red power cord sit tall (modified seated dead lift) 10x "I like this"  DATE: 4/23 UBE 5 min while discuss status Supine decompression series 5 sec holds, 5x each HEP Supine foam roll cervical Melt method: rotation, circles, nods 5x each Seated foam roll thoracic extension 10x Wall foam roll UE alternating shoulder elevation 10x Facing wall foam roll bil shoulder elevation 10x  2# front shoulder raises 10x 2# lateral shoulder raises 10x 5# shoulder shrugs 10x  PATIENT EDUCATION:  Given handout on anti inflammatory strategies:  meditation, exercise, diet, sleep; gave foam roll info Education details: Educated patient on anatomy and physiology of current symptoms, prognosis, plan of care as well as initial self care strategies to promote recovery  Person educated: Patient Education method: Explanation Education comprehension: verbalized understanding  HOME EXERCISE PROGRAM: Access Code: [email or web address] URL: [email or web address] Date: 08/21/2022 Prepared by: Lavinia Sharps  Exercises - Seated Thoracic Lumbar Extension with Pectoralis Stretch  - 1 x daily - 7 x weekly - 1 sets - 10 reps - Alternating Shoulder Flexion at Wall with Foam Roller  - 1 x daily - 7 x weekly - 1 sets - 10 reps - Serratus Activation at Wall with Foam Roll  - 1 x daily - 7 x weekly - 1 sets - 10 reps Patient instructions decompression series ASSESSMENT:  CLINICAL IMPRESSION: No pain prior to, during or following session but does report general fatigue.  Therapist modified ex's when rating of perceived exertion at a 6/10 for lighter intensity and seated ex's.  Therapist providing verbal cues to optimize technique with  exercises in order to achieve the greatest benefit.  Patient has  canceled some appts secondary to fatigue but also secondary to migraine headaches.      OBJECTIVE IMPAIRMENTS: decreased activity tolerance, decreased ROM, decreased strength, increased fascial restrictions, impaired perceived functional ability, impaired UE functional use, and pain.   ACTIVITY LIMITATIONS: carrying, lifting, sleeping, and caring for others  PARTICIPATION LIMITATIONS: cleaning, driving, and community activity  PERSONAL FACTORS: 1-2 comorbidities: history of spinal pain; prostate CA; anxiety  are also affecting patient's functional outcome.   REHAB POTENTIAL: Good  CLINICAL DECISION MAKING: Stable/uncomplicated  EVALUATION COMPLEXITY: Low   GOALS: Goals reviewed with patient? Yes  SHORT TERM GOALS: Target date: 08/28/2022    The patient will demonstrate knowledge of basic self care strategies and exercises to promote healing  Baseline:  Goal status: goal met 4/30  2.  Improved cervical rotation to 50 degrees bil needed for driving Baseline:  Goal status: ongoing  3.  Shoulder external rotation to C7 needed for dressing/grooming Baseline:  Goal status:met 4/30  4.  The patient will report a 25% improvement in UE use with housework (scrubbing) and lifting groceries Baseline:  Goal status: ongoing   LONG TERM GOALS: Target date: 09/25/2022    The patient will be independent in a safe self progression of a home exercise program to promote further recovery of function  Baseline:  Goal status: INITIAL  2.  The patient will report a 60% improvement in pain levels with functional activities which are currently difficult including lifting flats of water/groceries and housework (scrubbing) Baseline:  Goal status: INITIAL  3.  Sidebending ROM improved to 40 degrees needed for driving Baseline:  Goal status: INITIAL  4.  The patient will have grossly 4+/5 strength needed to lift and lower a 5-8# object from a high shelf  Baseline:  Goal status:  INITIAL  5.  FOTO score improved to 54% indicating improved function with less pain Baseline:  Goal status: INITIAL    PLAN:  PT  FREQUENCY: 1-2x/week  PT DURATION: 8 weeks  PLANNED INTERVENTIONS: Therapeutic exercises, Therapeutic activity, Neuromuscular re-education, Patient/Family education, Self Care, Joint mobilization, Aquatic Therapy, Dry Needling, Electrical stimulation, Spinal mobilization, Cryotherapy, Moist heat, Taping, Traction, Ultrasound, Ionotophoresis 4mg /ml Dexamethasone, Manual therapy, and Re-evaluation  PLAN FOR NEXT SESSION:  box squats; seated dead lifts;  pt requests 1x/week initially secondary to multiple appts;  home ex program progression;  UBE; progressive strengthening;  encourage anti-inflammatory strategies  Lavinia Sharps, PT 09/13/22 10:13 AM Phone: [phone] Fax: [phone]

## 2022-09-18 ENCOUNTER — Ambulatory Visit: Payer: PPO | Admitting: Physical Therapy

## 2022-09-20 ENCOUNTER — Telehealth: Payer: Self-pay | Admitting: Cardiovascular Disease

## 2022-09-20 ENCOUNTER — Ambulatory Visit: Payer: PPO | Admitting: Physical Therapy

## 2022-09-20 DIAGNOSIS — I2510 Atherosclerotic heart disease of native coronary artery without angina pectoris: Secondary | ICD-10-CM | POA: Diagnosis not present

## 2022-09-20 DIAGNOSIS — E559 Vitamin D deficiency, unspecified: Secondary | ICD-10-CM | POA: Diagnosis not present

## 2022-09-20 DIAGNOSIS — Z7189 Other specified counseling: Secondary | ICD-10-CM

## 2022-09-20 DIAGNOSIS — E785 Hyperlipidemia, unspecified: Secondary | ICD-10-CM | POA: Diagnosis not present

## 2022-09-20 NOTE — Telephone Encounter (Signed)
New message   Patient called saying his PCP suggested he have a CT calcium score test.  Since he was a patient of Dr Clifton James, PCP suggested that Dr Clifton James order test.  Please call patient and let him know if Dr Clifton James will order this test.

## 2022-09-21 NOTE — Telephone Encounter (Signed)
Left detailed message per DPR that Dr. Clifton James will order calcium score CT scan and the out of pocket cost.  Adv to call back and schedule this.  Order placed.

## 2022-09-26 DIAGNOSIS — E785 Hyperlipidemia, unspecified: Secondary | ICD-10-CM | POA: Diagnosis not present

## 2022-09-26 DIAGNOSIS — N182 Chronic kidney disease, stage 2 (mild): Secondary | ICD-10-CM | POA: Diagnosis not present

## 2022-09-26 DIAGNOSIS — G43719 Chronic migraine without aura, intractable, without status migrainosus: Secondary | ICD-10-CM | POA: Diagnosis not present

## 2022-09-26 DIAGNOSIS — C61 Malignant neoplasm of prostate: Secondary | ICD-10-CM | POA: Diagnosis not present

## 2022-09-26 DIAGNOSIS — I471 Supraventricular tachycardia, unspecified: Secondary | ICD-10-CM | POA: Diagnosis not present

## 2022-09-27 ENCOUNTER — Ambulatory Visit (HOSPITAL_BASED_OUTPATIENT_CLINIC_OR_DEPARTMENT_OTHER)
Admission: RE | Admit: 2022-09-27 | Discharge: 2022-09-27 | Disposition: A | Discharge status: Home / Self Care | Payer: PPO | Source: Ambulatory Visit | Attending: Cardiovascular Disease | Admitting: Cardiovascular Disease

## 2022-09-27 DIAGNOSIS — Z7189 Other specified counseling: Secondary | ICD-10-CM | POA: Insufficient documentation

## 2022-09-28 ENCOUNTER — Telehealth: Payer: Self-pay

## 2022-09-28 DIAGNOSIS — E785 Hyperlipidemia, unspecified: Secondary | ICD-10-CM

## 2022-09-28 MED ORDER — ROSUVASTATIN CALCIUM 10 MG PO TABS: 10.0000 mg | ORAL_TABLET | Freq: Every day | ORAL | 3 refills | Status: DC

## 2022-09-28 MED ORDER — ASPIRIN 81 MG PO TBEC: 81.0000 mg | DELAYED_RELEASE_TABLET | Freq: Every day | ORAL | 12 refills | Status: DC

## 2022-09-28 NOTE — Telephone Encounter (Signed)
Left VM to return call and sent a my chart message.

## 2022-09-28 NOTE — Telephone Encounter (Signed)
-----   Message from Kathleene Hazel, MD sent at 09/27/2022  3:21 PM EDT ----- Mild elevation of coronary calcium score. Can we let him know that hardening of the coronary arteries does not mean that he has severe blockage. This test is only looking for evidence of artery wall hardening. I would suggest that he start Crestor 10 mg daily and ASA 81 mg daily. He would only need stress testing if he has symptoms that are suggestive of angina such as chest pain or dyspnea with exertion. Can we let him know? Thanks, chris

## 2022-10-01 DIAGNOSIS — E785 Hyperlipidemia, unspecified: Secondary | ICD-10-CM | POA: Diagnosis not present

## 2022-10-01 DIAGNOSIS — I2510 Atherosclerotic heart disease of native coronary artery without angina pectoris: Secondary | ICD-10-CM | POA: Diagnosis not present

## 2022-10-01 DIAGNOSIS — N182 Chronic kidney disease, stage 2 (mild): Secondary | ICD-10-CM | POA: Diagnosis not present

## 2022-10-02 ENCOUNTER — Encounter: Payer: Self-pay | Admitting: Cardiovascular Disease

## 2022-10-03 DIAGNOSIS — M62838 Other muscle spasm: Secondary | ICD-10-CM | POA: Diagnosis not present

## 2022-10-03 DIAGNOSIS — R102 Pelvic and perineal pain: Secondary | ICD-10-CM | POA: Diagnosis not present

## 2022-10-03 DIAGNOSIS — R300 Dysuria: Secondary | ICD-10-CM | POA: Diagnosis not present

## 2022-10-05 ENCOUNTER — Other Ambulatory Visit (HOSPITAL_BASED_OUTPATIENT_CLINIC_OR_DEPARTMENT_OTHER): Payer: PPO

## 2022-10-08 NOTE — Telephone Encounter (Signed)
Added follow up labs for 12 weeks after starting Crestor 10 mg.

## 2022-10-08 NOTE — Addendum Note (Signed)
Addended by: Lendon Ka on: 10/08/2022 03:37 PM   Modules accepted: Orders

## 2022-10-09 ENCOUNTER — Ambulatory Visit (INDEPENDENT_AMBULATORY_CARE_PROVIDER_SITE_OTHER): Payer: PPO

## 2022-10-09 DIAGNOSIS — E538 Deficiency of other specified B group vitamins: Secondary | ICD-10-CM | POA: Diagnosis not present

## 2022-10-09 MED ORDER — CYANOCOBALAMIN 1000 MCG/ML IJ SOLN
1000.0000 ug | Freq: Once | INTRAMUSCULAR | Status: AC
Start: 2022-10-09 — End: 2022-10-09
  Administered 2022-10-09: 1000 ug via INTRAMUSCULAR

## 2022-10-09 NOTE — Progress Notes (Signed)
B12 given to patient Per Dr. Katrinka Blazing. Injection given in right deltoid, patient tolerated injection well.

## 2022-10-10 ENCOUNTER — Encounter: Payer: PPO | Admitting: Physical Therapy

## 2022-10-11 ENCOUNTER — Ambulatory Visit: Payer: PPO | Admitting: Family Medicine

## 2022-10-11 VITALS — BP 94/64 | HR 93 | Ht 70.0 in | Wt 141.0 lb

## 2022-10-11 DIAGNOSIS — C61 Malignant neoplasm of prostate: Secondary | ICD-10-CM | POA: Diagnosis not present

## 2022-10-11 DIAGNOSIS — R102 Pelvic and perineal pain: Secondary | ICD-10-CM | POA: Diagnosis not present

## 2022-10-11 DIAGNOSIS — G8929 Other chronic pain: Secondary | ICD-10-CM

## 2022-10-11 DIAGNOSIS — F32A Depression, unspecified: Secondary | ICD-10-CM | POA: Diagnosis not present

## 2022-10-11 MED ORDER — TRAZODONE HCL 50 MG PO TABS: 50.0000 mg | ORAL_TABLET | Freq: Every evening | ORAL | 3 refills | Status: DC | PRN

## 2022-10-11 NOTE — Assessment & Plan Note (Signed)
Referred for counseling.

## 2022-10-11 NOTE — Patient Instructions (Addendum)
Get labs in future Stop Remeron Referral put in for [facility] Trazodone 50mg  at night Take Crestor and 200mg  of CoQ10 with it Stop the Remeron See you again in 2 months

## 2022-10-11 NOTE — Assessment & Plan Note (Signed)
We can recheck PSA for him in our lab with patient having difficulty at other labs as he states.

## 2022-10-11 NOTE — Assessment & Plan Note (Signed)
Still having pelvic pain and is doing relatively well follow-up post radiation of the patient's prostate.  We will see how patient continues to do.  This helped difficulty though has caused him to be more depressed we will send him again to counseling which I think will be beneficial.  No suicidal or homicidal ideation.  Discussed with patient about icing regimen and home exercises.  Discussed about doing things that are important to him.  Tried trazodone with patient having difficulty with sleep.  Encouraged him to talk to his other providers about this as well.  Follow-up with me again 2 to 3 months.  Total time with him 31 minutes

## 2022-10-11 NOTE — Progress Notes (Signed)
[name] [address] [place] [address] Phone: [phone] Subjective:   William [name], am serving as a scribe for Dr. Antoine Primas.  I'm seeing this patient by the request  of:  Farris Has, MD  CC: Neck pain, allover pain  [id number]  William [name] is a 62 y.o. male coming in with complaint of neck pain. Patient states that he is in PT. Has been helping but still has stiffness. Was told that he needs to start a cholesterol medication but he is worried about subsequent joint pain related to these medications. Was using remeron and this made him more aggressive.   Wants to know if we can do the PSA lab and have it sent to his urologist.   Would like referral to behavioral health. Has fatigue, stress and depression. Inquires about [facility] and [employer].   Seeing nutritionist at [facility].        Past Medical History:  Diagnosis Date   Allergy    Anxiety    Chronic kidney disease    Depression    Diverticulitis    Gastroparesis    Heart murmur    Migraine headache    Mitral valve prolapse    SVT (supraventricular tachycardia)    Past Surgical History:  Procedure Laterality Date   COLONOSCOPY W/ BIOPSIES  2023   RADIOACTIVE SEED IMPLANT N/A 01/19/2022   Procedure: RADIOACTIVE SEED IMPLANT/BRACHYTHERAPY IMPLANT;  Surgeon: Bjorn Pippin, MD;  Location: [facility];  Service: Urology;  Laterality: N/A;   SPACE OAR INSTILLATION N/A 01/19/2022   Procedure: SPACE OAR INSTILLATION;  Surgeon: Bjorn Pippin, MD;  Location: [facility];  Service: Urology;  Laterality: N/A;   Social History   Socioeconomic History   Marital status: Married    Spouse name: Not on file   Number of children: 0   Years of education: Not on file   Highest education level: Bachelor's degree (e.g., BA, AB, BS)  Occupational History   Not on file  Tobacco Use   Smoking status: Never   Smokeless tobacco:  Never  Vaping Use   Vaping Use: Never used  Substance and Sexual Activity   Alcohol use: No    Alcohol/week: 0.0 standard drinks of alcohol   Drug use: No   Sexual activity: Yes    Partners: Female  Other Topics Concern   Not on file  Social History Narrative   Pt is married lives with spouse in 1 story apartment he has No children   BS degree- he is right handed   Drinks soda  Ginger ale, no coffee no tea   Right handed   Social Determinants of Health   Financial Resource Strain: Low Risk  (10/25/2021)   Overall Financial Resource Strain (CARDIA)    Difficulty of Paying Living Expenses: Not hard at all  Food Insecurity: No Food Insecurity (10/25/2021)   Hunger Vital Sign    Worried About Running Out of Food in the Last Year: Never true    Ran Out of Food in the Last Year: Never true  Transportation Needs: No Transportation Needs (10/25/2021)   PRAPARE - Administrator, Civil Service (Medical): No    Lack of Transportation (Non-Medical): No  Physical Activity: Not on file  Stress: Stress Concern Present (10/25/2021)   [employer] of Occupational Health - Occupational Stress Questionnaire    Feeling of Stress : Rather much  Social Connections: Not on  file   Allergies  Allergen Reactions   Erythromycin Anaphylaxis and Nausea And Vomiting   Nalbuphine Other (See Comments)    States loss of consciousness    Cefdinir Diarrhea, Nausea And Vomiting and Rash   Cephalexin Diarrhea and Nausea And Vomiting   Cephalosporins Diarrhea and Nausea And Vomiting   Hydrocodone-Acetaminophen Nausea And Vomiting   Ketorolac Nausea And Vomiting    Other reaction(s): upset stomach   Prochlorperazine Edisylate Other (See Comments)    uncontrollable shake - tremor    Restasis [Cyclosporine] Other (See Comments)    migraines   Tyloxapol Swelling   Bacitracin-Polymyxin B     Other reaction(s): rash gets worse Other reaction(s): rash gets worse   Codeine Other (See Comments)     Other reaction(s): stomach upset   Nalbuphine Hcl     Other reaction(s): stomach upset   Prochlorperazine     Other reaction(s): tremors/vomiting   Propranolol Diarrhea   Topamax [Topiramate] Diarrhea and Nausea And Vomiting   Zetia [Ezetimibe] Diarrhea   Cefdinir Diarrhea, Nausea And Vomiting and Rash    Other reaction(s): too stronge   Doxycycline Hyclate Nausea Only and Other (See Comments)    abd pain   Iodinated Contrast Media Other (See Comments)    Lightheaded, flushed feeling (explained to patient these are normal side effects of IV iodinated contrast, not allergies, but he wanted this noted in the epic; he tolerated epidural steroid injections w/ contrast w/o difficulty)   Ketoconazole Itching   Ketorolac Tromethamine Rash    REACTION: Reaction not known   Latex Itching and Rash   Neosporin [Neomycin-Bacitracin Zn-Polymyx] Rash   Oxycodone-Acetaminophen Other (See Comments)    Tylox caused constipation   Prednisone Nausea And Vomiting    Oral route, only Other reaction(s): stomach spasms   Family History  Problem Relation Age of Onset   Hypertension Mother    Lung cancer Father 34 - 47   Hypertension Brother    Kidney failure Maternal Grandmother    Prostate cancer Neg Hx    Kidney cancer Neg Hx      Current Outpatient Medications (Cardiovascular):    diltiazem (CARDIZEM CD) 120 MG 24 hr capsule, TAKE ONE CAPSULE BY MOUTH DAILY   diltiazem (CARDIZEM) 30 MG tablet, TAKE (1) TABLET AS NEEDED FOR PALPITATIONS   rosuvastatin (CRESTOR) 10 MG tablet, Take 1 tablet (10 mg total) by mouth daily.  Current Outpatient Medications (Respiratory):    fluticasone (FLONASE) 50 MCG/ACT nasal spray, Place 2 sprays into both nostrils daily.  Current Outpatient Medications (Analgesics):    aspirin EC 81 MG tablet, Take 1 tablet (81 mg total) by mouth daily. Swallow whole.   Galcanezumab-gnlm (EMGALITY) 120 MG/ML SOAJ, Inject 120 mg into the skin every 28 (twenty-eight) days.    Rimegepant Sulfate (NURTEC) 75 MG TBDP, Take 75 mg by mouth as needed for up to 1 dose (Maximum 1 tablet in 24 hours.).   Current Outpatient Medications (Other):    baclofen (LIORESAL) 10 MG tablet, Take 0.5 tablets (5 mg total) by mouth at bedtime as needed for muscle spasms.   clobetasol (TEMOVATE) 0.05 % external solution, Apply 1 Application topically 2 (two) times daily. Apply to scalp x 2 weeks prn itch   clobetasol cream (TEMOVATE) 0.05 %, Apply 1 Application topically 2 (two) times daily. To hands and feet x 2 weeks prn   hydrOXYzine (ATARAX) 50 MG tablet, Take 25-50 mg by mouth 2 (two) times daily as needed for itching.   metoCLOPramide (REGLAN)  5 MG tablet, Take 5 mg by mouth as needed.   mirtazapine (REMERON) 15 MG tablet, Take 1 tablet (15 mg total) by mouth at bedtime.   ondansetron (ZOFRAN-ODT) 8 MG disintegrating tablet, Take 8 mg by mouth 3 (three) times daily as needed.   polyethylene glycol powder (GLYCOLAX/MIRALAX) 17 GM/SCOOP powder, Take 17 g by mouth as needed for moderate constipation.   traZODone (DESYREL) 50 MG tablet, Take 1 tablet (50 mg total) by mouth at bedtime as needed for sleep.   Reviewed prior external information including notes and imaging from  primary care provider As well as notes that were available from care everywhere and other healthcare systems.  Reviewed patient CT calcium score of 144.  Past medical history, social, surgical and family history all reviewed in electronic medical record.  No pertanent information unless stated regarding to the chief complaint.   Review of Systems:  No headache, visual changes, nausea, vomiting, diarrhea, constipation, dizziness, abdominal pain, skin rash, fevers, chills, night sweats, weight loss, swollen lymph nodes, body aches, joint swelling, chest pain, shortness of breath, mood changes. POSITIVE muscle aches, body aches  Objective  Blood pressure 94/64, pulse 93, height 5\' 10"  (1.778 m), weight 141 lb (64  kg), SpO2 98 %.   General: No apparent distress alert and oriented x3 mood and affect normal, dressed appropriately.  HEENT: Pupils equal, extraocular movements intact  Respiratory: Patient's speak in full sentences and does not appear short of breath  Cardiovascular: No lower extremity edema, non tender, no erythema  Patient is still somewhat cachectic. Some mild difficulty going from seated to standing position.   Impression and Recommendations:     The above documentation has been reviewed and is accurate and complete Judi Saa, DO

## 2022-10-16 ENCOUNTER — Telehealth: Payer: Self-pay | Admitting: Physical Therapy

## 2022-10-16 ENCOUNTER — Encounter: Payer: PPO | Admitting: Physical Therapy

## 2022-10-16 NOTE — Telephone Encounter (Signed)
Returned patients call.  Left message on voice mail.

## 2022-10-18 ENCOUNTER — Encounter: Payer: Self-pay | Admitting: Dermatology

## 2022-10-18 ENCOUNTER — Ambulatory Visit (INDEPENDENT_AMBULATORY_CARE_PROVIDER_SITE_OTHER): Payer: PPO | Admitting: Dermatology

## 2022-10-18 VITALS — BP 110/66

## 2022-10-18 DIAGNOSIS — L821 Other seborrheic keratosis: Secondary | ICD-10-CM

## 2022-10-18 DIAGNOSIS — D492 Neoplasm of unspecified behavior of bone, soft tissue, and skin: Secondary | ICD-10-CM

## 2022-10-18 DIAGNOSIS — L219 Seborrheic dermatitis, unspecified: Secondary | ICD-10-CM

## 2022-10-18 DIAGNOSIS — L918 Other hypertrophic disorders of the skin: Secondary | ICD-10-CM | POA: Diagnosis not present

## 2022-10-18 DIAGNOSIS — L309 Dermatitis, unspecified: Secondary | ICD-10-CM

## 2022-10-18 DIAGNOSIS — L301 Dyshidrosis [pompholyx]: Secondary | ICD-10-CM

## 2022-10-18 DIAGNOSIS — L814 Other melanin hyperpigmentation: Secondary | ICD-10-CM

## 2022-10-18 NOTE — Patient Instructions (Addendum)
I recommended the following: Clobetasol solution twice daily to scalp for 2 weeks as needed then stop. Restart in 2 weeks as needed. Continue DHS Zinc shampoo   Continue Clobetasol cream twice daily for 2 weeks then stop. Restart Clobetasol cream bid as needed for flares. If not improved or condition flares, wait 2 weeks before restarting.    Dermatosis Papulosa Nigra   Skin Care: Dermatosis Papulosa Gilford Silvius can resolve with light electrodesiccation. Expectations: Dermatosis Papulosa Gilford Silvius are benign growths. No treatment is necessary. Plan: Cosmetic Quote The patient received the following cosmetic quote   Other Procedure(Skin Tags): Quote for cosmetic removal:   $300 to remove on 2 areas treatment on face    Due to recent changes in healthcare laws, you may see results of your pathology and/or laboratory studies on MyChart before the doctors have had a chance to review them. We understand that in some cases there may be results that are confusing or concerning to you. Please understand that not all results are received at the same time and often the doctors may need to interpret multiple results in order to provide you with the best plan of care or course of treatment. Therefore, we ask that you please give [place] 2 business days to thoroughly review all your results before contacting the office for clarification. Should we see a critical lab result, you will be contacted sooner.   If You Need Anything After Your Visit  If you have any questions or concerns for your doctor, please call our main line at [phone] If no one answers, please leave a voicemail as directed and we will return your call as soon as possible. Messages left after 4 pm will be answered the following business day.   You may also send [place] a message via MyChart. We typically respond to MyChart messages within 1-2 business days.  For prescription refills, please ask your pharmacy to contact our office. Our fax number  is [phone].  If you have an urgent issue when the clinic is closed that cannot wait until the next business day, you can page your doctor at the number below.    Please note that while we do our best to be available for urgent issues outside of office hours, we are not available 24/7.   If you have an urgent issue and are unable to reach [place], you may choose to seek medical care at your doctor's office, retail clinic, urgent care center, or emergency room.  If you have a medical emergency, please immediately call 911 or go to the emergency department. In the event of inclement weather, please call our main line at [phone] for an update on the status of any delays or closures.  Dermatology Medication Tips: Please keep the boxes that topical medications come in in order to help keep track of the instructions about where and how to use these. Pharmacies typically print the medication instructions only on the boxes and not directly on the medication tubes.   If your medication is too expensive, please contact our office at [phone] or send [place] a message through MyChart.   We are unable to tell what your co-pay for medications will be in advance as this is different depending on your insurance coverage. However, we may be able to find a substitute medication at lower cost or fill out paperwork to get insurance to cover a needed medication.   If a prior authorization is required to get your medication covered by your insurance company, please  allow [place] 1-2 business days to complete this process.  Drug prices often vary depending on where the prescription is filled and some pharmacies may offer cheaper prices.  The website www.goodrx.com contains coupons for medications through different pharmacies. The prices here do not account for what the cost may be with help from insurance (it may be cheaper with your insurance), but the website can give you the price if you did not use any insurance.   - You can print the associated coupon and take it with your prescription to the pharmacy.  - You may also stop by our office during regular business hours and pick up a GoodRx coupon card.  - If you need your prescription sent electronically to a different pharmacy, notify our office through [facility] or by phone at [phone]    Patient Handout: Wound Care for Skin Biopsy Site  Patient Handout: Wound Care for Skin Biopsy Site  Taking Care of Your Skin Biopsy Site  Proper care of the biopsy site is essential for promoting healing and minimizing scarring. This handout provides instructions on how to care for your biopsy site to ensure optimal recovery.  1. Cleaning the Wound:  Clean the biopsy site daily with gentle soap and water. Gently pat the area dry with a clean, soft towel. Avoid harsh scrubbing or rubbing the area, as this can irritate the skin and delay healing.  2. Applying Aquaphor and Bandage:  After cleaning the wound, apply a thin layer of Aquaphor ointment to the biopsy site. Cover the area with a sterile bandage to protect it from dirt, bacteria, and friction. Change the bandage daily or as needed if it becomes soiled or wet.  3. Continued Care for One Week:  Repeat the cleaning, Aquaphor application, and bandaging process daily for one week following the biopsy procedure. Keeping the wound clean and moist during this initial healing period will help prevent infection and promote optimal healing.  4. Massaging Aquaphor into the Area:  ---After one week, discontinue the use of bandages but continue to apply Aquaphor to the biopsy site. ----Gently massage the Aquaphor into the area using circular motions. ---Massaging the skin helps to promote circulation and prevent the formation of scar tissue.   Additional Tips:  Avoid exposing the biopsy site to direct sunlight during the healing process, as this can cause hyperpigmentation or worsen scarring. If  you experience any signs of infection, such as increased redness, swelling, warmth, or drainage from the wound, contact your healthcare provider immediately. Follow any additional instructions provided by your healthcare provider for caring for the biopsy site and managing any discomfort. Conclusion:  Taking proper care of your skin biopsy site is crucial for ensuring optimal healing and minimizing scarring. By following these instructions for cleaning, applying Aquaphor, and massaging the area, you can promote a smooth and successful recovery. If you have any questions or concerns about caring for your biopsy site, don't hesitate to contact your healthcare provider for guidance.

## 2022-10-18 NOTE — Progress Notes (Signed)
   Follow-Up Visit   Subjective  William [name] is a 62 y.o. male who presents for the following:  Seborrheic Dermatitis on the scalp and Dyshidrosis on the hands and feet. Both conditions are much better. He has not had to use the Clobetasol cream in a few weeks. He is using Eucerin Advanced Repair lotion. He wants a flat brown mole on his right hip checked. It is not bothersome. He has had it since childhood. No personal or family history of skin cancer.      Review of Systems:  No other skin or systemic complaints except as noted in HPI or Assessment and Plan.  Objective  Well appearing patient in no apparent distress; mood and affect are within normal limits.  A focused examination was performed including scalp, hands, feet, and right hip.   Right Thigh - Anterior 7mm irregular brown macule with dark brown speckles        Assessment & Plan  Neoplasm of skin Right Thigh - Anterior  Skin / nail biopsy Type of biopsy: tangential   Informed consent: discussed and consent obtained   Timeout: patient name, date of birth, surgical site, and procedure verified   Procedure prep:  Patient was prepped and draped in usual sterile fashion Prep type:  Isopropyl alcohol Anesthesia: the lesion was anesthetized in a standard fashion   Anesthetic:  1% lidocaine w/ epinephrine 1-100,000 buffered w/ 8.4% NaHCO3 Instrument used: DermaBlade   Hemostasis achieved with: aluminum chloride   Outcome: patient tolerated procedure well   Post-procedure details: sterile dressing applied and wound care instructions given   Dressing type: petrolatum gauze and bandage     Dyshidrosis-Improved  Hands, feet  Dry, scaly plaques with fissures    Continue Clobetasol cream twice daily for 2 weeks then stop. Restart Clobetasol cream bid as needed for flares. If not improved or condition flares, wait 2 weeks before restarting.    Recommend mixing with Eucerin Advanced Repair lotion and applying  morning and bedtime. At bedtime recommend wearing cotton gloves/socks over medication while sleeping.    Advised patient to wear gloves while cleaning or washing dishes.     Seborrheic dermatitis-Improved  Scalp  Erythema with greasy flakes and scale.      I recommended the following: Clobetasol solution twice daily to scalp for 2 weeks as needed then stop. Restart in 2 weeks as needed. Continue DHS Zinc shampoo     Dermatosis Papulosa Nigra   Skin Care: Dermatosis Papulosa Gilford Silvius can resolve with light electrodesiccation. Expectations: Dermatosis Papulosa Gilford Silvius are benign growths. No treatment is necessary. Plan: Cosmetic Quote The patient received the following cosmetic quote   Other Procedure(Skin Tags): Quote for cosmetic removal:   $300 to remove on 2 areas treatment on face  Return in about 6 months (around 04/19/2023), or if symptoms worsen or fail to improve, for Seborrheic Dermatitis Follow Up, Dyshidrosis.  William [name], CMA, am acting as scribe for [employer], DO .  Documentation: I have reviewed the above documentation for accuracy and completeness, and I agree with the above  Anetra Czerwinski, DO

## 2022-10-30 ENCOUNTER — Ambulatory Visit: Payer: PPO | Attending: Family Medicine | Admitting: Physical Therapy

## 2022-10-30 DIAGNOSIS — M542 Cervicalgia: Secondary | ICD-10-CM | POA: Insufficient documentation

## 2022-10-30 DIAGNOSIS — M62838 Other muscle spasm: Secondary | ICD-10-CM | POA: Diagnosis not present

## 2022-10-30 DIAGNOSIS — M6281 Muscle weakness (generalized): Secondary | ICD-10-CM | POA: Diagnosis not present

## 2022-10-30 DIAGNOSIS — M5412 Radiculopathy, cervical region: Secondary | ICD-10-CM | POA: Insufficient documentation

## 2022-10-30 DIAGNOSIS — R293 Abnormal posture: Secondary | ICD-10-CM | POA: Insufficient documentation

## 2022-10-30 DIAGNOSIS — M25511 Pain in right shoulder: Secondary | ICD-10-CM | POA: Insufficient documentation

## 2022-10-30 NOTE — Therapy (Signed)
OUTPATIENT PHYSICAL THERAPY CERVICAL PROGRESS NOTE/RECERTIFICATION  Patient Name: William [name] MRN: [id number] DOB:02/08/1961, 62 y.o., male Today's Date: 10/30/2022  END OF SESSION:  PT End of Session - 10/30/22 0847     Visit Number 5    Date for PT Re-Evaluation 12/25/22    Authorization Type Medicare [employer]    Progress Note Due on Visit 10    PT Start Time 0848    PT Stop Time 0930    PT Time Calculation (min) 42 min    Activity Tolerance Patient tolerated treatment well             Past Medical History:  Diagnosis Date   Allergy    Anxiety    Chronic kidney disease    Depression    Diverticulitis    Gastroparesis    Heart murmur    Migraine headache    Mitral valve prolapse    SVT (supraventricular tachycardia)    Past Surgical History:  Procedure Laterality Date   COLONOSCOPY W/ BIOPSIES  2023   RADIOACTIVE SEED IMPLANT N/A 01/19/2022   Procedure: RADIOACTIVE SEED IMPLANT/BRACHYTHERAPY IMPLANT;  Surgeon: Bjorn Pippin, MD;  Location: [facility] [place];  Service: Urology;  Laterality: N/A;   SPACE OAR INSTILLATION N/A 01/19/2022   Procedure: SPACE OAR INSTILLATION;  Surgeon: Bjorn Pippin, MD;  Location: [facility];  Service: Urology;  Laterality: N/A;   Patient Active Problem List   Diagnosis Date Noted   Depression 10/11/2022   B12 deficiency 06/08/2022   Loss of weight 06/08/2022   Genetic testing 11/10/2021   Malignant neoplasm of prostate (HCC) 10/24/2021   Right calf pain 09/18/2021   Caregiver burden 10/18/2020   Arthritis of right acromioclavicular joint 02/18/2020   Right rotator cuff tear 12/18/2019   Degenerative cervical disc 05/07/2019   Left lumbar radiculopathy 10/10/2017   Migraine without aura and without status migrainosus, not intractable 06/22/2017   Vitamin D deficiency 05/28/2017   Scrotal pain 05/28/2017   Paroxysmal SVT (supraventricular tachycardia) 05/28/2017   Mild left inguinal  hernia 11/23/2016   Chronic pelvic pain in male 09/28/2016   Abdominal pain, left lower quadrant 09/28/2016   Isolated proteinuria without specific morphologic lesion 06/27/2016   Left testicular pain 06/27/2016   Acute left flank pain 06/27/2016   Epididymitis, left 06/05/2016   Pelvic floor dysfunction 04/01/2016   GERD (gastroesophageal reflux disease) 10/13/2015   Gastroparesis 10/13/2015   Intractable chronic migraine without aura and without status migrainosus 12/15/2013   Hyperlipidemia 04/04/2012   Mitral valve disease 07/15/2009   IRRITABLE BOWEL SYNDROME 07/15/2009   Palpitations 06/28/2009    PCP: Farris Has MD  REFERRING PROVIDER: Smith,Zachary DO  REFERRING DIAG: M54.12 cervical radiculopathy  THERAPY DIAG:  Cervical radiculopathy Rationale for Evaluation and Treatment: Rehabilitation  ONSET DATE: Jan 2024  SUBJECTIVE:  SUBJECTIVE STATEMENT:    Still fatigued.  It's been tough with this "C" diagnosis.  My cholesterol is high and I take Crestor.  [facility] [employer] but [facility]'t [facility].  I just walk and go to the grocery store and [employer].    Hand dominance: Right  PERTINENT HISTORY:  prostate cancer 2023  Allergy      Anxiety     Chronic kidney disease     Depression     Diverticulitis     Gastroparesis     Heart murmur     Migraine headache     Mitral valve prolapse     SVT (supraventricular tachycardia)     PAIN:  PAIN:  Are you having pain? no NPRS scale: 0/10 Pain location: lower left back Aggravating factors: lifting a flat of water,scrubbing for housework, turning head for driving Relieving factors: avoiding things that aggravate  PRECAUTIONS: None  WEIGHT BEARING RESTRICTIONS: No  FALLS:  Has patient fallen in last 6 months?  No  LIVING ENVIRONMENT: Lives with: lives with their family Lives in: House/apartment   OCCUPATION: unable to work  PLOF: Independent  PATIENT GOALS: pain relief, be able to go to the gym  OBJECTIVE:   DIAGNOSTIC FINDINGS:  FINDINGS: No radiographic evidence of cervical spine fracture. There is multilevel degenerative disc disease moderate at C4-C5 and mild-to-moderate at C3-C4 and C5-C6, and with posterior endplate spurring. There is mild multilevel facet arthropathy. No prevertebral soft tissue swelling.   IMPRESSION: Mild to moderate multilevel degenerative disc disease from C3 through C6.   Mild multilevel facet arthropathy.  PATIENT SURVEYS:  FOTO 37%  7/2:  61% (goal met taken out of FOTO)   COGNITION: Overall cognitive status: Within functional limits for tasks assessed   PALPATION: Tender points left upper trap and levator scap muscles   CERVICAL ROM:   Active ROM A/PROM (deg) eval 5/7 7/2  Flexion 55 62 60  Extension 62 pain 43 mild pain 50  Right lateral flexion 25 pain 24 tight 28  Left lateral flexion 26 pain 36 25  Right rotation 45 50 40  Left rotation 30 47 42   (Blank rows = not tested)  UPPER EXTREMITY ZOX:WRUEAVW and abduction ROM WFLS; pain and limited bil shoulder external rotation to base of head, internal rotation to T8 bil painful 5/7: full shoulder ROM but with some tightness with internal rotation 7/2:  WFL some tightness with internal rotation  UPPER EXTREMITY MMT: deep cervical flexors and glenohumeral muscles grossly 4/5;  middle and lower traps 4-/5 7/2:  cervical and upper quarter grossly 4/5  TODAY'S TREATMENT:     DATE: 7/2: UBE 5 min while discuss status  squats holding 10# kettlebell in front of chair Standing lat bar 35# 20x Cervical ROM Upper quarter MMT FOTO Discussion on increasing activity level and effects of exercise on improving fatigue levels, overall health including cognition  Discussed a team approach  including PT, health coach, nutritionist, doctors Discussed community exercise options  DATE: 5/16: UBE 5 min while discuss status Staggered stance cable single side row 10# 10x right/left 20 inch box squats holding 10# kettlebell (has to sit all the way down difficult to stop/hover above) Standing lat bar 35# 20x Wall push ups 8x; wall push up with clap 8x 6/10 RPE Seated 2# front shoulder raises 10x Seated 2# lateral shoulder raises 10x Standing 7# shoulder shrugs 10x Seated  red power cord sit tall (modified seated dead lift) 20x  DATE: 5/7: UBE 5 min while discuss status Standing lat bar 35# 2x 15 2# front shoulder raises 10x 2# lateral shoulder raises 10x 7# shoulder shrugs 10x Rows cable 10# 15x Standing red power cord biceps curl 10x right/left Seated  red power cord sit tall (modified seated dead lift) 10x "I like this"                       PATIENT EDUCATION:  Given handout on anti inflammatory strategies:  meditation, exercise, diet, sleep; gave foam roll info Education details: Educated patient on anatomy and physiology of current symptoms, prognosis, plan of care as well as initial self care strategies to promote recovery  Person educated: Patient Education method: Explanation Education comprehension: verbalized understanding  HOME EXERCISE PROGRAM: Access Code: [email or web address] URL: [email or web address] Date: 08/21/2022 Prepared by: Lavinia Sharps  Exercises - Seated Thoracic Lumbar Extension with Pectoralis Stretch  - 1 x daily - 7 x weekly - 1 sets - 10 reps - Alternating Shoulder Flexion at Wall with Foam Roller  - 1 x daily - 7 x weekly - 1 sets - 10 reps - Serratus Activation at Wall with Foam Roll  - 1 x daily - 7 x weekly - 1 sets - 10 reps Patient instructions decompression series ASSESSMENT:  CLINICAL IMPRESSION: The patient has canceled some appts secondary to fatigue (related to his cancer diagnosis and migraine headaches.)  Despite the  low number of visits, he had a significant improvement in FOTO outcome score meeting LTG.  He has also improved with neck pain reduction and strength in periscapular musculature.  We have discussed the many benefits of exercise and he is interested in increasing his ex capacity but is unsure how to go about it.  The patient would benefit from a continuation of skilled PT for a further progression of strengthening and functional mobility.  Will continue to update and promote independence in a HEP or transition to a community based ex program needed for a return to the highest functional level possible with ADLs.       OBJECTIVE IMPAIRMENTS: decreased activity tolerance, decreased ROM, decreased strength, increased fascial restrictions, impaired perceived functional ability, impaired UE functional use, and pain.   ACTIVITY LIMITATIONS: carrying, lifting, sleeping, and caring for others  PARTICIPATION LIMITATIONS: cleaning, driving, and community activity  PERSONAL FACTORS: 1-2 comorbidities: history of spinal pain; prostate CA; anxiety  are also affecting patient's functional outcome.   REHAB POTENTIAL: Good  CLINICAL DECISION MAKING: Stable/uncomplicated  EVALUATION COMPLEXITY: Low   GOALS: Goals reviewed with patient? Yes  SHORT TERM GOALS: Target date: 08/28/2022    The patient will demonstrate knowledge of basic self care strategies and exercises to promote healing  Baseline:  Goal status: goal met 4/30  2.  Improved cervical rotation to 50 degrees bil needed for driving Baseline:  Goal status: ongoing  3.  Shoulder external rotation to C7 needed for dressing/grooming Baseline:  Goal status:met 4/30  4.  The patient will report a 25% improvement in UE use with housework (scrubbing) and lifting groceries Baseline:  Goal status: ongoing   LONG TERM GOALS: Target date: 12/25/2022    The patient will be independent in a safe self progression of a home exercise program or gym  program to promote further recovery of function  Baseline:  Goal status: ongoing  2.  The patient will report a 60% improvement in pain levels with functional activities which are currently difficult including lifting flats of water/groceries and  housework (scrubbing) Baseline:  Goal status: ongoing  3.  Sidebending ROM improved to 40 degrees needed for driving Baseline:  Goal status: ongoing  4.  The patient will have grossly 4+/5 strength needed to lift and lower a 5-8# object from a high shelf  Baseline:  Goal status: ongoing  5.  FOTO score improved to 54% indicating improved function with less pain Baseline:  Goal status: met 7/2   PLAN:  PT FREQUENCY: 1-2x/week  PT DURATION: 8 weeks  PLANNED INTERVENTIONS: Therapeutic exercises, Therapeutic activity, Neuromuscular re-education, Patient/Family education, Self Care, Joint mobilization, Aquatic Therapy, Dry Needling, Electrical stimulation, Spinal mobilization, Cryotherapy, Moist heat, Taping, Traction, Ultrasound, Ionotophoresis 4mg /ml Dexamethasone, Manual therapy, and Re-evaluation  PLAN FOR NEXT SESSION:  box squats; seated dead lifts;  home ex program progression;pt [facility] [employer];  UBE; progressive strengthening  Lavinia Sharps, PT 10/30/22 4:08 PM Phone: [phone] Fax: [phone]

## 2022-10-31 NOTE — Progress Notes (Signed)
[name] [address] [place] [address] Phone: [phone] Subjective:   William [name], am serving as a scribe for Dr. Antoine Primas.  I'm seeing this patient by the request  of:  Farris Has, MD  CC: Multiple complaints  [id number]  10/11/2022 Referred for counseling.     We can recheck PSA for him in our lab with patient having difficulty at other labs as he states.     Still having pelvic pain and is doing relatively well follow-up post radiation of the patient's prostate.  We will see how patient continues to do.  This helped difficulty though has caused him to be more depressed we will send him again to counseling which I think will be beneficial.  No suicidal or homicidal ideation.  Discussed with patient about icing regimen and home exercises.  Discussed about doing things that are important to him.  Tried trazodone with patient having difficulty with sleep.  Encouraged him to talk to his other providers about this as well.  Follow-up with me again 2 to 3 months.  Total time with him 31 minutes      Update 11/08/2022 William [name] is a 62 y.o. male coming in with complaint of talk about diagnosis. Labs for doctor.  Patient is still not seen the therapist we have referred him to.  Patient is still not feeling that he is motivated to do too many things at this moment.  Feels like he continues to have discomfort and pain.  Patient would like to increase activity but is concerned with the fatigue.  Has started with physical therapy but is not noticing drastic improvements but does think it is little by little.       Past Medical History:  Diagnosis Date   Allergy    Anxiety    Chronic kidney disease    Depression    Diverticulitis    Gastroparesis    Heart murmur    Migraine headache    Mitral valve prolapse    SVT (supraventricular tachycardia)    Past Surgical History:  Procedure Laterality Date   COLONOSCOPY W/  BIOPSIES  2023   RADIOACTIVE SEED IMPLANT N/A 01/19/2022   Procedure: RADIOACTIVE SEED IMPLANT/BRACHYTHERAPY IMPLANT;  Surgeon: Bjorn Pippin, MD;  Location: [facility];  Service: Urology;  Laterality: N/A;   SPACE OAR INSTILLATION N/A 01/19/2022   Procedure: SPACE OAR INSTILLATION;  Surgeon: Bjorn Pippin, MD;  Location: [facility];  Service: Urology;  Laterality: N/A;   Social History   Socioeconomic History   Marital status: Married    Spouse name: Not on file   Number of children: 0   Years of education: Not on file   Highest education level: Bachelor's degree (e.g., BA, AB, BS)  Occupational History   Not on file  Tobacco Use   Smoking status: Never   Smokeless tobacco: Never  Vaping Use   Vaping status: Never Used  Substance and Sexual Activity   Alcohol use: No    Alcohol/week: 0.0 standard drinks of alcohol   Drug use: No   Sexual activity: Yes    Partners: Female  Other Topics Concern   Not on file  Social History Narrative   Pt is married lives with spouse in 1 story apartment he has No children   BS degree- he is right handed   Drinks soda  Ginger ale, no coffee no tea   Right handed   Social Determinants  of Health   Financial Resource Strain: Low Risk  (10/25/2021)   Overall Financial Resource Strain (CARDIA)    Difficulty of Paying Living Expenses: Not hard at all  Food Insecurity: No Food Insecurity (10/25/2021)   Hunger Vital Sign    Worried About Running Out of Food in the Last Year: Never true    Ran Out of Food in the Last Year: Never true  Transportation Needs: No Transportation Needs (10/25/2021)   PRAPARE - Administrator, Civil Service (Medical): No    Lack of Transportation (Non-Medical): No  Physical Activity: Not on file  Stress: Stress Concern Present (10/25/2021)   [employer] of Occupational Health - Occupational Stress Questionnaire    Feeling of Stress : Rather much  Social Connections: Not on  file   Allergies  Allergen Reactions   Erythromycin Anaphylaxis and Nausea And Vomiting   Nalbuphine Other (See Comments)    States loss of consciousness    Cefdinir Diarrhea, Nausea And Vomiting and Rash   Cephalexin Diarrhea and Nausea And Vomiting   Cephalosporins Diarrhea and Nausea And Vomiting   Hydrocodone-Acetaminophen Nausea And Vomiting   Ketorolac Nausea And Vomiting    Other reaction(s): upset stomach   Prochlorperazine Edisylate Other (See Comments)    uncontrollable shake - tremor    Restasis [Cyclosporine] Other (See Comments)    migraines   Tyloxapol Swelling   Bacitracin-Polymyxin B     Other reaction(s): rash gets worse Other reaction(s): rash gets worse   Codeine Other (See Comments)    Other reaction(s): stomach upset   Nalbuphine Hcl     Other reaction(s): stomach upset   Prochlorperazine     Other reaction(s): tremors/vomiting   Propranolol Diarrhea   Topamax [Topiramate] Diarrhea and Nausea And Vomiting   Zetia [Ezetimibe] Diarrhea   Cefdinir Diarrhea, Nausea And Vomiting and Rash    Other reaction(s): too stronge   Doxycycline Hyclate Nausea Only and Other (See Comments)    abd pain   Iodinated Contrast Media Other (See Comments)    Lightheaded, flushed feeling (explained to patient these are normal side effects of IV iodinated contrast, not allergies, but he wanted this noted in the epic; he tolerated epidural steroid injections w/ contrast w/o difficulty)   Ketoconazole Itching   Ketorolac Tromethamine Rash    REACTION: Reaction not known   Latex Itching and Rash   Neosporin [Neomycin-Bacitracin Zn-Polymyx] Rash   Oxycodone-Acetaminophen Other (See Comments)    Tylox caused constipation   Prednisone Nausea And Vomiting    Oral route, only Other reaction(s): stomach spasms   Family History  Problem Relation Age of Onset   Hypertension Mother    Lung cancer Father 74 - 72   Hypertension Brother    Kidney failure Maternal Grandmother     Prostate cancer Neg Hx    Kidney cancer Neg Hx      Current Outpatient Medications (Cardiovascular):    diltiazem (CARDIZEM CD) 120 MG 24 hr capsule, TAKE ONE CAPSULE BY MOUTH DAILY   diltiazem (CARDIZEM) 30 MG tablet, TAKE (1) TABLET AS NEEDED FOR PALPITATIONS   rosuvastatin (CRESTOR) 10 MG tablet, Take 1 tablet (10 mg total) by mouth daily.  Current Outpatient Medications (Respiratory):    fluticasone (FLONASE) 50 MCG/ACT nasal spray, Place 2 sprays into both nostrils daily.  Current Outpatient Medications (Analgesics):    aspirin EC 81 MG tablet, Take 1 tablet (81 mg total) by mouth daily. Swallow whole.   Galcanezumab-gnlm (EMGALITY) 120 MG/ML SOAJ, Inject  120 mg into the skin every 28 (twenty-eight) days.   Rimegepant Sulfate (NURTEC) 75 MG TBDP, Take 75 mg by mouth as needed for up to 1 dose (Maximum 1 tablet in 24 hours.).   Current Outpatient Medications (Other):    baclofen (LIORESAL) 10 MG tablet, Take 0.5 tablets (5 mg total) by mouth at bedtime as needed for muscle spasms.   clobetasol (TEMOVATE) 0.05 % external solution, Apply 1 Application topically 2 (two) times daily. Apply to scalp x 2 weeks prn itch   clobetasol cream (TEMOVATE) 0.05 %, Apply 1 Application topically 2 (two) times daily. To hands and feet x 2 weeks prn   hydrOXYzine (ATARAX) 50 MG tablet, Take 25-50 mg by mouth 2 (two) times daily as needed for itching.   metoCLOPramide (REGLAN) 5 MG tablet, Take 5 mg by mouth as needed.   mirtazapine (REMERON) 15 MG tablet, Take 1 tablet (15 mg total) by mouth at bedtime.   ondansetron (ZOFRAN-ODT) 8 MG disintegrating tablet, Take 8 mg by mouth 3 (three) times daily as needed.   polyethylene glycol powder (GLYCOLAX/MIRALAX) 17 GM/SCOOP powder, Take 17 g by mouth as needed for moderate constipation.   traZODone (DESYREL) 50 MG tablet, Take 1 tablet (50 mg total) by mouth at bedtime as needed for sleep.  Review of Systems:  No headache, visual changes, nausea, vomiting,  diarrhea, constipation, dizziness, abdominal pain, skin rash, fevers, chills, night sweats, weight loss, swollen lymph nodes joint swelling, chest pain, shortness of breath, mood changes. POSITIVE muscle aches, body aches, fatigue  Objective  Blood pressure 122/68, pulse 78, height 5\' 10"  (1.778 m), weight 142 lb (64.4 kg), SpO2 98%.   General: No apparent distress alert and oriented x3 mood and affect normal, dressed appropriately.  HEENT: Pupils equal, extraocular movements intact  Respiratory: Patient's speak in full sentences and does not appear short of breath  Cardiovascular: No lower extremity edema, non tender, no erythema  Patient back exam does have some loss of lordosis.  Still has some limited range of motion of the shoulders bilaterally.  Is sitting comfortably but does seem to be a little anxious at baseline.    Impression and Recommendations:    The above documentation has been reviewed and is accurate and complete Judi Saa, DO

## 2022-11-06 ENCOUNTER — Ambulatory Visit: Payer: PPO

## 2022-11-06 DIAGNOSIS — M6281 Muscle weakness (generalized): Secondary | ICD-10-CM

## 2022-11-06 DIAGNOSIS — M5412 Radiculopathy, cervical region: Secondary | ICD-10-CM

## 2022-11-06 DIAGNOSIS — M542 Cervicalgia: Secondary | ICD-10-CM

## 2022-11-06 DIAGNOSIS — R293 Abnormal posture: Secondary | ICD-10-CM

## 2022-11-06 DIAGNOSIS — M25511 Pain in right shoulder: Secondary | ICD-10-CM

## 2022-11-06 DIAGNOSIS — M62838 Other muscle spasm: Secondary | ICD-10-CM

## 2022-11-06 NOTE — Therapy (Signed)
OUTPATIENT PHYSICAL THERAPY CERVICAL TREATMENT NOTE  Patient Name: William [name] MRN: [id number] DOB:08-08-1960, 62 y.o., male Today's Date: 11/06/2022  END OF SESSION:  PT End of Session - 11/06/22 0939     Visit Number 6    Date for PT Re-Evaluation 12/25/22    Authorization Type Medicare healthteam advantage    Progress Note Due on Visit 10    PT Start Time 0935    PT Stop Time 1033    PT Time Calculation (min) 58 min    Activity Tolerance Patient tolerated treatment well    Behavior During Therapy [facility] for tasks assessed/performed             Past Medical History:  Diagnosis Date   Allergy    Anxiety    Chronic kidney disease    Depression    Diverticulitis    Gastroparesis    Heart murmur    Migraine headache    Mitral valve prolapse    SVT (supraventricular tachycardia)    Past Surgical History:  Procedure Laterality Date   COLONOSCOPY W/ BIOPSIES  2023   RADIOACTIVE SEED IMPLANT N/A 01/19/2022   Procedure: RADIOACTIVE SEED IMPLANT/BRACHYTHERAPY IMPLANT;  Surgeon: Bjorn Pippin, MD;  Location: [facility] ;  Service: Urology;  Laterality: N/A;   SPACE OAR INSTILLATION N/A 01/19/2022   Procedure: SPACE OAR INSTILLATION;  Surgeon: Bjorn Pippin, MD;  Location: [facility];  Service: Urology;  Laterality: N/A;   Patient Active Problem List   Diagnosis Date Noted   Depression 10/11/2022   B12 deficiency 06/08/2022   Loss of weight 06/08/2022   Genetic testing 11/10/2021   Malignant neoplasm of prostate (HCC) 10/24/2021   Right calf pain 09/18/2021   Caregiver burden 10/18/2020   Arthritis of right acromioclavicular joint 02/18/2020   Right rotator cuff tear 12/18/2019   Degenerative cervical disc 05/07/2019   Left lumbar radiculopathy 10/10/2017   Migraine without aura and without status migrainosus, not intractable 06/22/2017   Vitamin D deficiency 05/28/2017   Scrotal pain 05/28/2017   Paroxysmal SVT (supraventricular  tachycardia) 05/28/2017   Mild left inguinal hernia 11/23/2016   Chronic pelvic pain in male 09/28/2016   Abdominal pain, left lower quadrant 09/28/2016   Isolated proteinuria without specific morphologic lesion 06/27/2016   Left testicular pain 06/27/2016   Acute left flank pain 06/27/2016   Epididymitis, left 06/05/2016   Pelvic floor dysfunction 04/01/2016   GERD (gastroesophageal reflux disease) 10/13/2015   Gastroparesis 10/13/2015   Intractable chronic migraine without aura and without status migrainosus 12/15/2013   Hyperlipidemia 04/04/2012   Mitral valve disease 07/15/2009   IRRITABLE BOWEL SYNDROME 07/15/2009   Palpitations 06/28/2009    PCP: Farris Has MD  REFERRING PROVIDER: Smith,Zachary DO  REFERRING DIAG: M54.12 cervical radiculopathy  THERAPY DIAG:  Cervical radiculopathy Rationale for Evaluation and Treatment: Rehabilitation  ONSET DATE: Jan 2024  SUBJECTIVE:  SUBJECTIVE STATEMENT: Patient reports that he seems to be doing ok.  Working on his exercises.      Hand dominance: Right  PERTINENT HISTORY:  prostate cancer 2023  Allergy      Anxiety     Chronic kidney disease     Depression     Diverticulitis     Gastroparesis     Heart murmur     Migraine headache     Mitral valve prolapse     SVT (supraventricular tachycardia)     PAIN:  PAIN: 11/06/22 Are you having pain? no NPRS scale: 0/10 Pain location: lower left back Aggravating factors: lifting a flat of water,scrubbing for housework, turning head for driving Relieving factors: avoiding things that aggravate  PRECAUTIONS: None  WEIGHT BEARING RESTRICTIONS: No  FALLS:  Has patient fallen in last 6 months? No  LIVING ENVIRONMENT: Lives with: lives with their family Lives in:  House/apartment   OCCUPATION: unable to work  PLOF: Independent  PATIENT GOALS: pain relief, be able to go to the gym  OBJECTIVE:   DIAGNOSTIC FINDINGS:  FINDINGS: No radiographic evidence of cervical spine fracture. There is multilevel degenerative disc disease moderate at C4-C5 and mild-to-moderate at C3-C4 and C5-C6, and with posterior endplate spurring. There is mild multilevel facet arthropathy. No prevertebral soft tissue swelling.   IMPRESSION: Mild to moderate multilevel degenerative disc disease from C3 through C6.   Mild multilevel facet arthropathy.  PATIENT SURVEYS:  FOTO 37%  7/2:  61% (goal met taken out of FOTO)   COGNITION: Overall cognitive status: Within functional limits for tasks assessed   PALPATION: Tender points left upper trap and levator scap muscles   CERVICAL ROM:   Active ROM A/PROM (deg) eval 5/7 7/2  Flexion 55 62 60  Extension 62 pain 43 mild pain 50  Right lateral flexion 25 pain 24 tight 28  Left lateral flexion 26 pain 36 25  Right rotation 45 50 40  Left rotation 30 47 42   (Blank rows = not tested)  UPPER EXTREMITY ZOX:WRUEAVW and abduction ROM WFLS; pain and limited bil shoulder external rotation to base of head, internal rotation to T8 bil painful 5/7: full shoulder ROM but with some tightness with internal rotation 7/2:  WFL some tightness with internal rotation  UPPER EXTREMITY MMT: deep cervical flexors and glenohumeral muscles grossly 4/5;  middle and lower traps 4-/5 7/2:  cervical and upper quarter grossly 4/5  TODAY'S TREATMENT:     DATE: 7/9: UBE 5 min while discuss status 3 way scapular stabilization with green loop x 10 4 D ball rolls with light blue plyo ball Prone shoulder extension 2 x 10 with 2 lbs Prone shoulder rows 2 x 10 with 2 lbs Prone shoulder horizontal abduction 2 x 10 with 2 lbs Side lying shoulder ER 2 x 10 with 2 lbs Supine serratus punch 2 x 10 with 2 lbs Spent remainder of session  discussing goals and encouraged patient to re-focus on the positive outcomes of his treatments and some things to consider when caring for his mother.  Stress and anxiety related to his cx diagnosis and caring for his mother and how this contributes to his migraines.    DATE: 7/2: UBE 5 min while discuss status  squats holding 10# kettlebell in front of chair Standing lat bar 35# 20x Cervical ROM Upper quarter MMT FOTO Discussion on increasing activity level and effects of exercise on improving fatigue levels, overall health including cognition  Discussed a team  approach including PT, health coach, nutritionist, doctors Discussed community exercise options  DATE: 5/16: UBE 5 min while discuss status Staggered stance cable single side row 10# 10x right/left 20 inch box squats holding 10# kettlebell (has to sit all the way down difficult to stop/hover above) Standing lat bar 35# 20x Wall push ups 8x; wall push up with clap 8x 6/10 RPE Seated 2# front shoulder raises 10x Seated 2# lateral shoulder raises 10x Standing 7# shoulder shrugs 10x Seated  red power cord sit tall (modified seated dead lift) 20x   PATIENT EDUCATION:  Given handout on anti inflammatory strategies:  meditation, exercise, diet, sleep; gave foam roll info Education details: Educated patient on anatomy and physiology of current symptoms, prognosis, plan of care as well as initial self care strategies to promote recovery  Person educated: Patient Education method: Explanation Education comprehension: verbalized understanding  HOME EXERCISE PROGRAM: Access Code: [email or web address] URL: [email or web address] Date: 08/21/2022 Prepared by: Lavinia Sharps  Exercises - Seated Thoracic Lumbar Extension with Pectoralis Stretch  - 1 x daily - 7 x weekly - 1 sets - 10 reps - Alternating Shoulder Flexion at Wall with Foam Roller  - 1 x daily - 7 x weekly - 1 sets - 10 reps - Serratus Activation at Wall with Foam Roll   - 1 x daily - 7 x weekly - 1 sets - 10 reps Patient instructions decompression series ASSESSMENT:  CLINICAL IMPRESSION: William [name] is doing fairly well with his neck and shoulder progression.  However, he seems to be dealing with a lot of stress with regard to his cx diagnosis and caring for his mother.   This seems to contribute to his overall struggles with his health and motivation to exercise.  He was encouraged to try and focus on the positives of his current condition and how well he is doing with his shoulder and neck rehab as well as his follow up appts with his cx doctors.  He would benefit from continuing skilled PT for c spine ROM and postural strengthening.     OBJECTIVE IMPAIRMENTS: decreased activity tolerance, decreased ROM, decreased strength, increased fascial restrictions, impaired perceived functional ability, impaired UE functional use, and pain.   ACTIVITY LIMITATIONS: carrying, lifting, sleeping, and caring for others  PARTICIPATION LIMITATIONS: cleaning, driving, and community activity  PERSONAL FACTORS: 1-2 comorbidities: history of spinal pain; prostate CA; anxiety  are also affecting patient's functional outcome.   REHAB POTENTIAL: Good  CLINICAL DECISION MAKING: Stable/uncomplicated  EVALUATION COMPLEXITY: Low   GOALS: Goals reviewed with patient? Yes  SHORT TERM GOALS: Target date: 08/28/2022    The patient will demonstrate knowledge of basic self care strategies and exercises to promote healing  Baseline:  Goal status: goal met 4/30  2.  Improved cervical rotation to 50 degrees bil needed for driving Baseline:  Goal status: ongoing  3.  Shoulder external rotation to C7 needed for dressing/grooming Baseline:  Goal status:met 4/30  4.  The patient will report a 25% improvement in UE use with housework (scrubbing) and lifting groceries Baseline:  Goal status: ongoing   LONG TERM GOALS: Target date: 12/25/2022    The patient will be independent in a  safe self progression of a home exercise program or gym program to promote further recovery of function  Baseline:  Goal status: ongoing  2.  The patient will report a 60% improvement in pain levels with functional activities which are currently difficult including lifting flats of water/groceries and housework (scrubbing) Baseline:  Goal status:  MET 11/06/22  3.  Sidebending ROM improved to 40 degrees needed for driving Baseline:  Goal status: ongoing  4.  The patient will have grossly 4+/5 strength needed to lift and lower a 5-8# object from a high shelf  Baseline:  Goal status: ongoing  5.  FOTO score improved to 54% indicating improved function with less pain Baseline:  Goal status: met 7/2   PLAN:  PT FREQUENCY: 1-2x/week  PT DURATION: 8 weeks  PLANNED INTERVENTIONS: Therapeutic exercises, Therapeutic activity, Neuromuscular re-education, Patient/Family education, Self Care, Joint mobilization, Aquatic Therapy, Dry Needling, Electrical stimulation, Spinal mobilization, Cryotherapy, Moist heat, Taping, Traction, Ultrasound, Ionotophoresis 4mg /ml Dexamethasone, Manual therapy, and Re-evaluation  PLAN FOR NEXT SESSION:  UBE, progressive strengthening, incorporate a plan for [employer] and getting he and his mother in a routine exercise program.    Victorino Dike B. Priyal Musquiz, PT 11/06/22 10:50 AM [facility] [address] [place], [place] [address] Phone # [phone] Fax [phone]

## 2022-11-07 DIAGNOSIS — E785 Hyperlipidemia, unspecified: Secondary | ICD-10-CM | POA: Diagnosis not present

## 2022-11-07 DIAGNOSIS — N182 Chronic kidney disease, stage 2 (mild): Secondary | ICD-10-CM | POA: Diagnosis not present

## 2022-11-07 DIAGNOSIS — E559 Vitamin D deficiency, unspecified: Secondary | ICD-10-CM | POA: Diagnosis not present

## 2022-11-08 ENCOUNTER — Encounter: Payer: Self-pay | Admitting: Family Medicine

## 2022-11-08 ENCOUNTER — Ambulatory Visit: Payer: PPO | Admitting: Family Medicine

## 2022-11-08 VITALS — BP 122/68 | HR 78 | Ht 70.0 in | Wt 142.0 lb

## 2022-11-08 DIAGNOSIS — C61 Malignant neoplasm of prostate: Secondary | ICD-10-CM | POA: Diagnosis not present

## 2022-11-08 DIAGNOSIS — E538 Deficiency of other specified B group vitamins: Secondary | ICD-10-CM

## 2022-11-08 DIAGNOSIS — M75111 Incomplete rotator cuff tear or rupture of right shoulder, not specified as traumatic: Secondary | ICD-10-CM

## 2022-11-08 LAB — URINALYSIS
Bilirubin Urine: NEGATIVE
Hgb urine dipstick: NEGATIVE
Ketones, ur: NEGATIVE
Leukocytes,Ua: NEGATIVE
Nitrite: NEGATIVE
Specific Gravity, Urine: 1.01 (ref 1.000–1.030)
Total Protein, Urine: NEGATIVE
Urine Glucose: NEGATIVE
Urobilinogen, UA: 0.2 (ref 0.0–1.0)
pH: 6.5 (ref 5.0–8.0)

## 2022-11-08 LAB — PSA: PSA: 1.11 ng/mL (ref 0.10–4.00)

## 2022-11-08 MED ORDER — CYANOCOBALAMIN 1000 MCG/ML IJ SOLN
1000.0000 ug | Freq: Once | INTRAMUSCULAR | Status: AC
Start: 2022-11-08 — End: 2022-11-08
  Administered 2022-11-08: 1000 ug via INTRAMUSCULAR

## 2022-11-08 NOTE — Assessment & Plan Note (Signed)
Will get labs again for urologist.  Would like to add a urinalysis to further evaluate with patient still having some dysuria.  Follow-up again in 6 to 8 weeks otherwise.

## 2022-11-08 NOTE — Patient Instructions (Addendum)
[facility] [phone] Labs today B12 injection See you again in 2-3 months

## 2022-11-08 NOTE — Assessment & Plan Note (Signed)
Doing well with this as well as with physical therapy at the moment.  Discussed continuing to stay active when possible.  Follow-up with me again in 2 to 3 months

## 2022-11-08 NOTE — Assessment & Plan Note (Signed)
Injection today 

## 2022-11-13 ENCOUNTER — Ambulatory Visit: Payer: PPO | Admitting: Physical Therapy

## 2022-11-13 ENCOUNTER — Telehealth: Payer: Self-pay | Admitting: Family Medicine

## 2022-11-13 DIAGNOSIS — M5412 Radiculopathy, cervical region: Secondary | ICD-10-CM | POA: Diagnosis not present

## 2022-11-13 DIAGNOSIS — M6281 Muscle weakness (generalized): Secondary | ICD-10-CM

## 2022-11-13 NOTE — Therapy (Addendum)
OUTPATIENT PHYSICAL THERAPY CERVICAL TREATMENT NOTE  Patient Name: William [name] MRN: [id number] DOB:[birth year], 62 y.o., male Today's Date: 11/13/2022  END OF SESSION:  PT End of Session - 11/13/22 0930     Visit Number 7    Date for PT Re-Evaluation 12/25/22    Authorization Type Medicare healthteam advantage    Progress Note Due on Visit 10    PT Start Time 0930    PT Stop Time 1010    PT Time Calculation (min) 40 min    Activity Tolerance Patient tolerated treatment well             Past Medical History:  Diagnosis Date   Allergy    Anxiety    Chronic kidney disease    Depression    Diverticulitis    Gastroparesis    Heart murmur    Migraine headache    Mitral valve prolapse    SVT (supraventricular tachycardia)    Past Surgical History:  Procedure Laterality Date   COLONOSCOPY W/ BIOPSIES  2023   RADIOACTIVE SEED IMPLANT N/A 01/19/2022   Procedure: RADIOACTIVE SEED IMPLANT/BRACHYTHERAPY IMPLANT;  Surgeon: Bjorn Pippin, MD;  Location: [facility];  Service: Urology;  Laterality: N/A;   SPACE OAR INSTILLATION N/A 01/19/2022   Procedure: SPACE OAR INSTILLATION;  Surgeon: Bjorn Pippin, MD;  Location: [facility];  Service: Urology;  Laterality: N/A;   Patient Active Problem List   Diagnosis Date Noted   Depression 10/11/2022   B12 deficiency 06/08/2022   Loss of weight 06/08/2022   Genetic testing 11/10/2021   Malignant neoplasm of prostate (HCC) 10/24/2021   Right calf pain 09/18/2021   Caregiver burden 10/18/2020   Arthritis of right acromioclavicular joint 02/18/2020   Right rotator cuff tear 12/18/2019   Degenerative cervical disc 05/07/2019   Left lumbar radiculopathy 10/10/2017   Migraine without aura and without status migrainosus, not intractable 06/22/2017   Vitamin D deficiency 05/28/2017   Scrotal pain 05/28/2017   Paroxysmal SVT (supraventricular tachycardia) 05/28/2017   Mild left inguinal hernia 11/23/2016    Chronic pelvic pain in male 09/28/2016   Abdominal pain, left lower quadrant 09/28/2016   Isolated proteinuria without specific morphologic lesion 06/27/2016   Left testicular pain 06/27/2016   Acute left flank pain 06/27/2016   Epididymitis, left 06/05/2016   Pelvic floor dysfunction 04/01/2016   GERD (gastroesophageal reflux disease) 10/13/2015   Gastroparesis 10/13/2015   Intractable chronic migraine without aura and without status migrainosus 12/15/2013   Hyperlipidemia 04/04/2012   Mitral valve disease 07/15/2009   IRRITABLE BOWEL SYNDROME 07/15/2009   Palpitations 06/28/2009    PCP: Farris Has MD  REFERRING PROVIDER: Smith,Zachary DO  REFERRING DIAG: M54.12 cervical radiculopathy  THERAPY DIAG:  Cervical radiculopathy Rationale for Evaluation and Treatment: Rehabilitation  ONSET DATE: Jan 2024  SUBJECTIVE:  SUBJECTIVE STATEMENT: Waiting on some test results, should get them today.   Thinking about moving to a new apartment.  I've been preparing for that and so far I haven't hurt myself with the lifting.   Hand dominance: Right  PERTINENT HISTORY:  prostate cancer 2023  Allergy      Anxiety     Chronic kidney disease     Depression     Diverticulitis     Gastroparesis     Heart murmur     Migraine headache     Mitral valve prolapse     SVT (supraventricular tachycardia)     PAIN:  PAIN: 11/06/22 Are you having pain? no NPRS scale: 0/10 Pain location: lower left back Aggravating factors: lifting a flat of water,scrubbing for housework, turning head for driving Relieving factors: avoiding things that aggravate  PRECAUTIONS: None  WEIGHT BEARING RESTRICTIONS: No  FALLS:  Has patient fallen in last 6 months? No  LIVING ENVIRONMENT: Lives with: lives with  their family Lives in: House/apartment   OCCUPATION: unable to work  PLOF: Independent  PATIENT GOALS: pain relief, be able to go to the gym  OBJECTIVE:   DIAGNOSTIC FINDINGS:  FINDINGS: No radiographic evidence of cervical spine fracture. There is multilevel degenerative disc disease moderate at C4-C5 and mild-to-moderate at C3-C4 and C5-C6, and with posterior endplate spurring. There is mild multilevel facet arthropathy. No prevertebral soft tissue swelling.   IMPRESSION: Mild to moderate multilevel degenerative disc disease from C3 through C6.   Mild multilevel facet arthropathy.  PATIENT SURVEYS:  FOTO 37%  7/2:  61% (goal met taken out of FOTO)   COGNITION: Overall cognitive status: Within functional limits for tasks assessed   PALPATION: Tender points left upper trap and levator scap muscles   CERVICAL ROM:   Active ROM A/PROM (deg) eval 5/7 7/2 7/16  Flexion 55 62 60   Extension 62 pain 43 mild pain 50   Right lateral flexion 25 pain 24 tight 28 25 mild pain  Left lateral flexion 26 pain 36 25 32 mild pain  Right rotation 45 50 40 50  Left rotation 30 47 42 30   (Blank rows = not tested)  UPPER EXTREMITY VOZ:DGUYQIH and abduction ROM WFLS; pain and limited bil shoulder external rotation to base of head, internal rotation to T8 bil painful 5/7: full shoulder ROM but with some tightness with internal rotation 7/2:  WFL some tightness with internal rotation  UPPER EXTREMITY MMT: deep cervical flexors and glenohumeral muscles grossly 4/5;  middle and lower traps 4-/5 7/2:  cervical and upper quarter grossly 4/5  TODAY'S TREATMENT:     DATE: 7/16: UBE 5 min while discuss status Counter top push ups 10x2 5# KB push press 8x on each side 3 way scapular stabilization on wall with green loop x 8x on each side Prone over green ball on mat table shoulder Ys  2 x 10 with 2 lbs Prone shoulder horizontal abduction green ball on mat table 2 x 10 with 2  lbs RPE 8/10, after seated rest break recovers well to exercise a bit more at a lower intensity Nu-Step L6 (new model) 8 min Seated upper trap stretch to each side Seated SNAG cervical rotation with towel 8x right/left (added to HEP)  DATE: 7/9: UBE 5 min while discuss status 3 way scapular stabilization with green loop x 10 4 D ball rolls with light blue plyo ball Prone shoulder extension 2 x 10 with 2 lbs Prone shoulder rows  2 x 10 with 2 lbs Prone shoulder horizontal abduction 2 x 10 with 2 lbs Side lying shoulder ER 2 x 10 with 2 lbs Supine serratus punch 2 x 10 with 2 lbs Spent remainder of session discussing goals and encouraged patient to re-focus on the positive outcomes of his treatments and some things to consider when caring for his mother.  Stress and anxiety related to his cx diagnosis and caring for his mother and how this contributes to his migraines.    DATE: 7/2: UBE 5 min while discuss status  squats holding 10# kettlebell in front of chair Standing lat bar 35# 20x Cervical ROM Upper quarter MMT FOTO Discussion on increasing activity level and effects of exercise on improving fatigue levels, overall health including cognition  Discussed a team approach including PT, health coach, nutritionist, doctors Discussed community exercise options  PATIENT EDUCATION:  Given handout on anti inflammatory strategies:  meditation, exercise, diet, sleep; gave foam roll info Education details: Educated patient on anatomy and physiology of current symptoms, prognosis, plan of care as well as initial self care strategies to promote recovery  Person educated: Patient Education method: Explanation Education comprehension: verbalized understanding  HOME EXERCISE PROGRAM: Access Code: [email or web address] URL: [email or web address] Date: 11/13/2022 Prepared by: Lavinia Sharps  Exercises - Seated Thoracic Lumbar Extension with Pectoralis Stretch  - 1 x daily - 7 x weekly - 1  sets - 10 reps - Alternating Shoulder Flexion at Wall with Foam Roller  - 1 x daily - 7 x weekly - 1 sets - 10 reps - Serratus Activation at Wall with Foam Roll  - 1 x daily - 7 x weekly - 1 sets - 10 reps - Seated Assisted Cervical Rotation with Towel  - 1 x daily - 7 x weekly - 1 sets - 10 reps Patient instructions decompression series ASSESSMENT:  CLINICAL IMPRESSION: Cervical rotation and sidebending ROM limited with min change since start of care.  Added to HEP to include intentional ROM on these directions.  With exercise patient with a quick increase to 8/10 RPE however he recovers quickly with a rest and lower intensity ex (stretching). He reports some shoulder fatigue but overall well tolerated.  Functionally, he reports he's been moving medium items successfully.  Will continue to establish an ex program and encourage a plan for a continued program in the future.     OBJECTIVE IMPAIRMENTS: decreased activity tolerance, decreased ROM, decreased strength, increased fascial restrictions, impaired perceived functional ability, impaired UE functional use, and pain.   ACTIVITY LIMITATIONS: carrying, lifting, sleeping, and caring for others  PARTICIPATION LIMITATIONS: cleaning, driving, and community activity  PERSONAL FACTORS: 1-2 comorbidities: history of spinal pain; prostate CA; anxiety  are also affecting patient's functional outcome.   REHAB POTENTIAL: Good  CLINICAL DECISION MAKING: Stable/uncomplicated  EVALUATION COMPLEXITY: Low   GOALS: Goals reviewed with patient? Yes  SHORT TERM GOALS: Target date: 08/28/2022    The patient will demonstrate knowledge of basic self care strategies and exercises to promote healing  Baseline:  Goal status: goal met 4/30  2.  Improved cervical rotation to 50 degrees bil needed for driving Baseline:  Goal status: ongoing  3.  Shoulder external rotation to C7 needed for dressing/grooming Baseline:  Goal status:met 4/30  4.  The  patient will report a 25% improvement in UE use with housework (scrubbing) and lifting groceries Baseline:  Goal status: goal met 7/16   LONG TERM GOALS: Target date: 12/25/2022    The patient will be  independent in a safe self progression of a home exercise program or gym program to promote further recovery of function  Baseline:  Goal status: ongoing  2.  The patient will report a 60% improvement in pain levels with functional activities which are currently difficult including lifting flats of water/groceries and housework (scrubbing) Baseline:  Goal status: MET 11/06/22  3.  Sidebending ROM improved to 40 degrees needed for driving Baseline:  Goal status: ongoing  4.  The patient will have grossly 4+/5 strength needed to lift and lower a 5-8# object from a high shelf  Baseline:  Goal status: met  7/16  5.  FOTO score improved to 54% indicating improved function with less pain Baseline:  Goal status: met 7/2   PLAN:  PT FREQUENCY: 1-2x/week  PT DURATION: 8 weeks  PLANNED INTERVENTIONS: Therapeutic exercises, Therapeutic activity, Neuromuscular re-education, Patient/Family education, Self Care, Joint mobilization, Aquatic Therapy, Dry Needling, Electrical stimulation, Spinal mobilization, Cryotherapy, Moist heat, Taping, Traction, Ultrasound, Ionotophoresis 4mg /ml Dexamethasone, Manual therapy, and Re-evaluation  PLAN FOR NEXT SESSION:  UBE, progressive strengthening, incorporate a plan for [employer] and getting he and his mother in a routine exercise program.    Lavinia Sharps, PT 11/13/22 10:20 AM Phone: [phone] Fax: [phone]  [name] [address], [address] [place], [place] [address] Phone # [phone] Fax [phone] PHYSICAL THERAPY DISCHARGE SUMMARY  Visits from Start of Care: 7  Current functional level related to goals / functional outcomes: Pt did not return after 7/16 visit   Remaining deficits: As above    Education / Equipment: HEP   Patient agrees to discharge. Patient goals were partially met. Patient is being discharged due to not returning since the last visit.  Lavinia Sharps, PT 01/21/23 3:12 PM Phone: [phone] Fax: [phone]

## 2022-11-13 NOTE — Telephone Encounter (Signed)
Patient called stating that he had talked to Dr Katrinka Blazing about sending his lab results over to his Urologist, Dr Annabell Howells. He wanted to give [place] a fax number to send them to.  Fax # [phone] ATTN: Dr Annabell Howells

## 2022-11-13 NOTE — Telephone Encounter (Signed)
 Labs faxed and patient notified

## 2022-11-15 DIAGNOSIS — C61 Malignant neoplasm of prostate: Secondary | ICD-10-CM | POA: Diagnosis not present

## 2022-11-15 DIAGNOSIS — R300 Dysuria: Secondary | ICD-10-CM | POA: Diagnosis not present

## 2022-11-15 DIAGNOSIS — F411 Generalized anxiety disorder: Secondary | ICD-10-CM | POA: Diagnosis not present

## 2022-11-19 ENCOUNTER — Telehealth: Payer: Self-pay | Admitting: Cardiovascular Disease

## 2022-11-19 NOTE — Telephone Encounter (Signed)
Patient called today and was complaining of abdominal pain and diarrhea. Wanted to find out if the new medicine "rosuvastatin" would be causing this. Please call and advise.

## 2022-11-19 NOTE — Telephone Encounter (Signed)
Called patient.  He is having abdominal pain and loose stools nightly after taking rosuvastatin.  I asked him to hold for about a week and let us know if symptoms resolve.  Pt in agreement with this plan.

## 2022-11-20 ENCOUNTER — Encounter: Payer: PPO | Admitting: Physical Therapy

## 2022-11-22 DIAGNOSIS — R972 Elevated prostate specific antigen [PSA]: Secondary | ICD-10-CM | POA: Diagnosis not present

## 2022-11-27 ENCOUNTER — Ambulatory Visit: Payer: PPO | Admitting: Physical Therapy

## 2022-11-30 ENCOUNTER — Encounter: Payer: Self-pay | Admitting: Cardiovascular Disease

## 2022-11-30 ENCOUNTER — Ambulatory Visit: Payer: PPO | Admitting: Physical Therapy

## 2022-11-30 DIAGNOSIS — E785 Hyperlipidemia, unspecified: Secondary | ICD-10-CM | POA: Diagnosis not present

## 2022-11-30 DIAGNOSIS — J309 Allergic rhinitis, unspecified: Secondary | ICD-10-CM | POA: Diagnosis not present

## 2022-11-30 MED ORDER — ATORVASTATIN CALCIUM 40 MG PO TABS: 40.0000 mg | ORAL_TABLET | Freq: Every day | ORAL | 3 refills | Status: DC

## 2022-11-30 NOTE — Telephone Encounter (Signed)
Supple, [name], William [name]  Lendon Ka, RN It looks like pt has many medication intolerances including GI upset on other meds as well. I don't see that he's tried prior statins before. LDL in the 130s most recently, goal < 70 due to elevated calcium score. Could try atorvastatin 40mg  daily and see if he tolerates it better.

## 2022-11-30 NOTE — Telephone Encounter (Signed)
    11/19/22  5:12 PM Note Called patient.  He is having abdominal pain and loose stools nightly after taking rosuvastatin.  I asked him to hold for about a week and let us know if symptoms resolve.  Pt in agreement with this plan    ______________________________________________________________  Will route to MD/PharmD for recommendations.

## 2022-12-12 NOTE — Progress Notes (Unsigned)
[name] [address] [place] [address] Phone: [phone] Subjective:   [name], am serving as a scribe for Dr. Antoine Primas.  I'm seeing this patient by the request  of:  Farris Has, MD  CC: right shoulder and pelvic pain   [id number]  11/08/2022 Will get labs again for urologist.  Would like to add a urinalysis to further evaluate with patient still having some dysuria.  Follow-up again in 6 to 8 weeks otherwise.     Doing well with this as well as with physical therapy at the moment.  Discussed continuing to stay active when possible.  Follow-up with me again in 2 to 3 months     Injection today   Update 12/13/2022 Norvil Blackaby is a 62 y.o. male coming in with complaint of R shoulder and pelvic pain. Patient states B12 injection today. PSA test 1.13 wants to talk results. Found mold in his apartment.     Past Medical History:  Diagnosis Date   Allergy    Anxiety    Chronic kidney disease    Depression    Diverticulitis    Gastroparesis    Heart murmur    Migraine headache    Mitral valve prolapse    SVT (supraventricular tachycardia)    Past Surgical History:  Procedure Laterality Date   COLONOSCOPY W/ BIOPSIES  2023   RADIOACTIVE SEED IMPLANT N/A 01/19/2022   Procedure: RADIOACTIVE SEED IMPLANT/BRACHYTHERAPY IMPLANT;  Surgeon: Bjorn Pippin, MD;  Location: [facility];  Service: Urology;  Laterality: N/A;   SPACE OAR INSTILLATION N/A 01/19/2022   Procedure: SPACE OAR INSTILLATION;  Surgeon: Bjorn Pippin, MD;  Location: [facility];  Service: Urology;  Laterality: N/A;   Social History   Socioeconomic History   Marital status: Married    Spouse name: Not on file   Number of children: 0   Years of education: Not on file   Highest education level: Bachelor's degree (e.g., BA, AB, BS)  Occupational History   Not on file  Tobacco Use   Smoking status: Never   Smokeless  tobacco: Never  Vaping Use   Vaping status: Never Used  Substance and Sexual Activity   Alcohol use: No    Alcohol/week: 0.0 standard drinks of alcohol   Drug use: No   Sexual activity: Yes    Partners: Female  Other Topics Concern   Not on file  Social History Narrative   Pt is married lives with spouse in 1 story apartment he has No children   BS degree- he is right handed   Drinks soda  Ginger ale, no coffee no tea   Right handed   Social Determinants of Health   Financial Resource Strain: Low Risk  (10/25/2021)   Overall Financial Resource Strain (CARDIA)    Difficulty of Paying Living Expenses: Not hard at all  Food Insecurity: No Food Insecurity (10/25/2021)   Hunger Vital Sign    Worried About Running Out of Food in the Last Year: Never true    Ran Out of Food in the Last Year: Never true  Transportation Needs: No Transportation Needs (10/25/2021)   PRAPARE - Administrator, Civil Service (Medical): No    Lack of Transportation (Non-Medical): No  Physical Activity: Not on file  Stress: Stress Concern Present (10/25/2021)   [employer] of Occupational Health - Occupational Stress Questionnaire    Feeling of Stress : Rather much  Social Connections: Not on file   Allergies  Allergen Reactions   Erythromycin Anaphylaxis and Nausea And Vomiting   Nalbuphine Other (See Comments)    States loss of consciousness    Cefdinir Diarrhea, Nausea And Vomiting and Rash   Cephalexin Diarrhea and Nausea And Vomiting   Cephalosporins Diarrhea and Nausea And Vomiting   Hydrocodone-Acetaminophen Nausea And Vomiting   Ketorolac Nausea And Vomiting    Other reaction(s): upset stomach   Prochlorperazine Edisylate Other (See Comments)    uncontrollable shake - tremor    Restasis [Cyclosporine] Other (See Comments)    migraines   Tyloxapol Swelling   Bacitracin-Polymyxin B     Other reaction(s): rash gets worse Other reaction(s): rash gets worse   Codeine Other  (See Comments)    Other reaction(s): stomach upset   Nalbuphine Hcl     Other reaction(s): stomach upset   Prochlorperazine     Other reaction(s): tremors/vomiting   Propranolol Diarrhea   Rosuvastatin Diarrhea   Topamax [Topiramate] Diarrhea and Nausea And Vomiting   Zetia [Ezetimibe] Diarrhea   Cefdinir Diarrhea, Nausea And Vomiting and Rash    Other reaction(s): too stronge   Doxycycline Hyclate Nausea Only and Other (See Comments)    abd pain   Iodinated Contrast Media Other (See Comments)    Lightheaded, flushed feeling (explained to patient these are normal side effects of IV iodinated contrast, not allergies, but he wanted this noted in the epic; he tolerated epidural steroid injections w/ contrast w/o difficulty)   Ketoconazole Itching   Ketorolac Tromethamine Rash    REACTION: Reaction not known   Latex Itching and Rash   Neosporin [Neomycin-Bacitracin Zn-Polymyx] Rash   Oxycodone-Acetaminophen Other (See Comments)    Tylox caused constipation   Prednisone Nausea And Vomiting    Oral route, only Other reaction(s): stomach spasms   Family History  Problem Relation Age of Onset   Hypertension Mother    Lung cancer Father 37 - 72   Hypertension Brother    Kidney failure Maternal Grandmother    Prostate cancer Neg Hx    Kidney cancer Neg Hx      Current Outpatient Medications (Cardiovascular):    atorvastatin (LIPITOR) 40 MG tablet, Take 1 tablet (40 mg total) by mouth daily.   diltiazem (CARDIZEM CD) 120 MG 24 hr capsule, TAKE ONE CAPSULE BY MOUTH DAILY   diltiazem (CARDIZEM) 30 MG tablet, TAKE (1) TABLET AS NEEDED FOR PALPITATIONS  Current Outpatient Medications (Respiratory):    fluticasone (FLONASE) 50 MCG/ACT nasal spray, Place 2 sprays into both nostrils daily.  Current Outpatient Medications (Analgesics):    aspirin EC 81 MG tablet, Take 1 tablet (81 mg total) by mouth daily. Swallow whole.   Galcanezumab-gnlm (EMGALITY) 120 MG/ML SOAJ, Inject 120 mg into  the skin every 28 (twenty-eight) days.   Rimegepant Sulfate (NURTEC) 75 MG TBDP, Take 75 mg by mouth as needed for up to 1 dose (Maximum 1 tablet in 24 hours.).   Current Outpatient Medications (Other):    baclofen (LIORESAL) 10 MG tablet, Take 0.5 tablets (5 mg total) by mouth at bedtime as needed for muscle spasms.   clobetasol (TEMOVATE) 0.05 % external solution, Apply 1 Application topically 2 (two) times daily. Apply to scalp x 2 weeks prn itch   clobetasol cream (TEMOVATE) 0.05 %, Apply 1 Application topically 2 (two) times daily. To hands and feet x 2 weeks prn   hydrOXYzine (ATARAX) 50 MG tablet, Take 25-50 mg by mouth 2 (two) times daily  as needed for itching.   metoCLOPramide (REGLAN) 5 MG tablet, Take 5 mg by mouth as needed.   mirtazapine (REMERON) 15 MG tablet, Take 1 tablet (15 mg total) by mouth at bedtime.   ondansetron (ZOFRAN-ODT) 8 MG disintegrating tablet, Take 8 mg by mouth 3 (three) times daily as needed.   polyethylene glycol powder (GLYCOLAX/MIRALAX) 17 GM/SCOOP powder, Take 17 g by mouth as needed for moderate constipation.   traZODone (DESYREL) 50 MG tablet, Take 1 tablet (50 mg total) by mouth at bedtime as needed for sleep.   Reviewed prior external information including notes and imaging from  primary care provider As well as notes that were available from care everywhere and other healthcare systems.  Past medical history, social, surgical and family history all reviewed in electronic medical record.  No pertanent information unless stated regarding to the chief complaint.   Review of Systems:  No headache, visual changes, nausea, vomiting, diarrhea, constipation, dizziness, abdominal pain, skin rash, fevers, chills, night sweats, weight loss, swollen lymph nodes, body aches, joint swelling, chest pain, shortness of breath, . POSITIVE muscle aches, anxious  Objective  Blood pressure 120/70, pulse 87, height 5\' 10"  (1.778 m), weight 140 lb (63.5 kg), SpO2 99%.    General: No apparent distress alert and oriented x3 mood and affect normal, dressed appropriately.  HEENT: Pupils equal, extraocular movements intact  Respiratory: Patient's speak in full sentences and does not appear short of breath  Cardiovascular: No lower extremity edema, non tender, no erythema  Patient sitting comfortably at the moment.  Patient does still have muscular tenderness.    Impression and Recommendations:    The above documentation has been reviewed and is accurate and complete Judi Saa, DO

## 2022-12-13 ENCOUNTER — Encounter: Payer: Self-pay | Admitting: Family Medicine

## 2022-12-13 ENCOUNTER — Ambulatory Visit: Payer: PPO | Admitting: Family Medicine

## 2022-12-13 VITALS — BP 120/70 | HR 87 | Ht 70.0 in | Wt 140.0 lb

## 2022-12-13 DIAGNOSIS — C61 Malignant neoplasm of prostate: Secondary | ICD-10-CM

## 2022-12-13 DIAGNOSIS — E538 Deficiency of other specified B group vitamins: Secondary | ICD-10-CM | POA: Diagnosis not present

## 2022-12-13 MED ORDER — CYANOCOBALAMIN 1000 MCG/ML IJ SOLN
1000.0000 ug | Freq: Once | INTRAMUSCULAR | Status: AC
Start: 2022-12-13 — End: 2022-12-13
  Administered 2022-12-13: 1000 ug via INTRAMUSCULAR

## 2022-12-13 NOTE — Patient Instructions (Signed)
PSA in Sept See you again after urology in October Remember to focus more on yourself

## 2022-12-13 NOTE — Assessment & Plan Note (Signed)
Still having difficulty overall with feeling optimistic at the moment.  Patient has had PSAs drawn in multiple different sites and we discussed trying to compare them can be difficult with different laboratory equipment.  Patient would like to have another check with our lab in the next 2 months.  We discussed that really should be followed by urology but if it does help him I think it is reasonable.  Patient is concerned with waiting too long to know which way the PSA is going in case something else needs to be done.  We discussed still behavioral health aspect.  Patient is still looking into it to try to find if anything could be beneficial but did find a cash pay provider that he is optimistic about.  Follow-up with me again in 2 to 3 months.  Total time discussed with patient 33 minutes

## 2022-12-13 NOTE — Assessment & Plan Note (Signed)
Repeat B12 injection given today

## 2022-12-14 DIAGNOSIS — J019 Acute sinusitis, unspecified: Secondary | ICD-10-CM | POA: Diagnosis not present

## 2022-12-14 DIAGNOSIS — J309 Allergic rhinitis, unspecified: Secondary | ICD-10-CM | POA: Diagnosis not present

## 2022-12-26 ENCOUNTER — Ambulatory Visit: Payer: PPO | Attending: Cardiovascular Disease

## 2022-12-26 DIAGNOSIS — Z713 Dietary counseling and surveillance: Secondary | ICD-10-CM | POA: Diagnosis not present

## 2022-12-26 DIAGNOSIS — E559 Vitamin D deficiency, unspecified: Secondary | ICD-10-CM | POA: Diagnosis not present

## 2022-12-26 DIAGNOSIS — E785 Hyperlipidemia, unspecified: Secondary | ICD-10-CM | POA: Diagnosis not present

## 2022-12-26 DIAGNOSIS — N182 Chronic kidney disease, stage 2 (mild): Secondary | ICD-10-CM | POA: Diagnosis not present

## 2023-01-16 DIAGNOSIS — L309 Dermatitis, unspecified: Secondary | ICD-10-CM | POA: Diagnosis not present

## 2023-01-16 DIAGNOSIS — J309 Allergic rhinitis, unspecified: Secondary | ICD-10-CM | POA: Diagnosis not present

## 2023-01-16 DIAGNOSIS — R059 Cough, unspecified: Secondary | ICD-10-CM | POA: Diagnosis not present

## 2023-01-16 NOTE — Progress Notes (Unsigned)
[name] [address] [place] [address] Phone: [phone] Subjective:   Bruce Donath, am serving as a scribe for Dr. Antoine Primas.  I'm seeing this patient by the request  of:  Farris Has, MD  CC: Multiple complaint follow-up  [id number]  12/13/2022 Still having difficulty overall with feeling optimistic at the moment.  Patient has had PSAs drawn in multiple different sites and we discussed trying to compare them can be difficult with different laboratory equipment.  Patient would like to have another check with our lab in the next 2 months.  We discussed that really should be followed by urology but if it does help him I think it is reasonable.  Patient is concerned with waiting too long to know which way the PSA is going in case something else needs to be done.  We discussed still behavioral health aspect.  Patient is still looking into it to try to find if anything could be beneficial but did find a cash pay provider that he is optimistic about.  Follow-up with me again in 2 to 3 months.  Total time discussed with patient 33 minutes     Repeat B12 injection given today.      Update 01/17/2023 William [name] is a 62 y.o. male coming in with complaint of lumbar spine and pelvis pain. Patient states that he has been stiff. Moved since last visit. Unable to finish physical therapy. Notes that when B12 injection wears out he has some fatigue.      Past Medical History:  Diagnosis Date   Allergy    Anxiety    Chronic kidney disease    Depression    Diverticulitis    Gastroparesis    Heart murmur    Migraine headache    Mitral valve prolapse    SVT (supraventricular tachycardia)    Past Surgical History:  Procedure Laterality Date   COLONOSCOPY W/ BIOPSIES  2023   RADIOACTIVE SEED IMPLANT N/A 01/19/2022   Procedure: RADIOACTIVE SEED IMPLANT/BRACHYTHERAPY IMPLANT;  Surgeon: Bjorn Pippin, MD;  Location: [facility];  Service: Urology;  Laterality: N/A;   SPACE OAR INSTILLATION N/A 01/19/2022   Procedure: SPACE OAR INSTILLATION;  Surgeon: Bjorn Pippin, MD;  Location: [facility];  Service: Urology;  Laterality: N/A;   Social History   Socioeconomic History   Marital status: Married    Spouse name: Not on file   Number of children: 0   Years of education: Not on file   Highest education level: Bachelor's degree (e.g., BA, AB, BS)  Occupational History   Not on file  Tobacco Use   Smoking status: Never   Smokeless tobacco: Never  Vaping Use   Vaping status: Never Used  Substance and Sexual Activity   Alcohol use: No    Alcohol/week: 0.0 standard drinks of alcohol   Drug use: No   Sexual activity: Yes    Partners: Female  Other Topics Concern   Not on file  Social History Narrative   Pt is married lives with spouse in 1 story apartment he has No children   BS degree- he is right handed   Drinks soda  Ginger ale, no coffee no tea   Right handed   Social Determinants of Health   Financial Resource Strain: Low Risk  (10/25/2021)   Overall Financial Resource Strain (CARDIA)    Difficulty of Paying Living Expenses: Not hard at all  Food Insecurity: No Food  Insecurity (10/25/2021)   Hunger Vital Sign    Worried About Running Out of Food in the Last Year: Never true    Ran Out of Food in the Last Year: Never true  Transportation Needs: No Transportation Needs (10/25/2021)   PRAPARE - Administrator, Civil Service (Medical): No    Lack of Transportation (Non-Medical): No  Physical Activity: Not on file  Stress: Stress Concern Present (10/25/2021)   [employer] of Occupational Health - Occupational Stress Questionnaire    Feeling of Stress : Rather much  Social Connections: Not on file   Allergies  Allergen Reactions   Erythromycin Anaphylaxis and Nausea And Vomiting   Nalbuphine Other (See Comments)    States loss of consciousness    Cefdinir  Diarrhea, Nausea And Vomiting and Rash   Cephalexin Diarrhea and Nausea And Vomiting   Cephalosporins Diarrhea and Nausea And Vomiting   Hydrocodone-Acetaminophen Nausea And Vomiting   Ketorolac Nausea And Vomiting    Other reaction(s): upset stomach   Prochlorperazine Edisylate Other (See Comments)    uncontrollable shake - tremor    Restasis [Cyclosporine] Other (See Comments)    migraines   Tyloxapol Swelling   Bacitracin-Polymyxin B     Other reaction(s): rash gets worse Other reaction(s): rash gets worse   Codeine Other (See Comments)    Other reaction(s): stomach upset   Nalbuphine Hcl     Other reaction(s): stomach upset   Prochlorperazine     Other reaction(s): tremors/vomiting   Propranolol Diarrhea   Rosuvastatin Diarrhea   Topamax [Topiramate] Diarrhea and Nausea And Vomiting   Zetia [Ezetimibe] Diarrhea   Cefdinir Diarrhea, Nausea And Vomiting and Rash    Other reaction(s): too stronge   Doxycycline Hyclate Nausea Only and Other (See Comments)    abd pain   Iodinated Contrast Media Other (See Comments)    Lightheaded, flushed feeling (explained to patient these are normal side effects of IV iodinated contrast, not allergies, but he wanted this noted in the epic; he tolerated epidural steroid injections w/ contrast w/o difficulty)   Ketoconazole Itching   Ketorolac Tromethamine Rash    REACTION: Reaction not known   Latex Itching and Rash   Neosporin [Neomycin-Bacitracin Zn-Polymyx] Rash   Oxycodone-Acetaminophen Other (See Comments)    Tylox caused constipation   Prednisone Nausea And Vomiting    Oral route, only Other reaction(s): stomach spasms   Family History  Problem Relation Age of Onset   Hypertension Mother    Lung cancer Father 81 - 71   Hypertension Brother    Kidney failure Maternal Grandmother    Prostate cancer Neg Hx    Kidney cancer Neg Hx      Current Outpatient Medications (Cardiovascular):    atorvastatin (LIPITOR) 40 MG tablet, Take  1 tablet (40 mg total) by mouth daily.   diltiazem (CARDIZEM CD) 120 MG 24 hr capsule, TAKE ONE CAPSULE BY MOUTH DAILY   diltiazem (CARDIZEM) 30 MG tablet, TAKE (1) TABLET AS NEEDED FOR PALPITATIONS  Current Outpatient Medications (Respiratory):    fluticasone (FLONASE) 50 MCG/ACT nasal spray, Place 2 sprays into both nostrils daily.  Current Outpatient Medications (Analgesics):    aspirin EC 81 MG tablet, Take 1 tablet (81 mg total) by mouth daily. Swallow whole.   Galcanezumab-gnlm (EMGALITY) 120 MG/ML SOAJ, Inject 120 mg into the skin every 28 (twenty-eight) days.   Rimegepant Sulfate (NURTEC) 75 MG TBDP, Take 75 mg by mouth as needed for up to 1 dose (Maximum 1  tablet in 24 hours.).   Current Outpatient Medications (Other):    baclofen (LIORESAL) 10 MG tablet, Take 0.5 tablets (5 mg total) by mouth at bedtime as needed for muscle spasms.   clobetasol (TEMOVATE) 0.05 % external solution, Apply 1 Application topically 2 (two) times daily. Apply to scalp x 2 weeks prn itch   clobetasol cream (TEMOVATE) 0.05 %, Apply 1 Application topically 2 (two) times daily. To hands and feet x 2 weeks prn   hydrOXYzine (ATARAX) 50 MG tablet, Take 25-50 mg by mouth 2 (two) times daily as needed for itching.   metoCLOPramide (REGLAN) 5 MG tablet, Take 5 mg by mouth as needed.   mirtazapine (REMERON) 15 MG tablet, Take 1 tablet (15 mg total) by mouth at bedtime.   ondansetron (ZOFRAN-ODT) 8 MG disintegrating tablet, Take 8 mg by mouth 3 (three) times daily as needed.   polyethylene glycol powder (GLYCOLAX/MIRALAX) 17 GM/SCOOP powder, Take 17 g by mouth as needed for moderate constipation.   traZODone (DESYREL) 50 MG tablet, Take 1 tablet (50 mg total) by mouth at bedtime as needed for sleep.   Reviewed prior external information including notes and imaging from  primary care provider As well as notes that were available from care everywhere and other healthcare systems.  Past medical history, social,  surgical and family history all reviewed in electronic medical record.  No pertanent information unless stated regarding to the chief complaint.   Review of Systems:  No headache, visual changes, nausea, vomiting, diarrhea, constipation, dizziness, abdominal pain, skin rash, fevers, chills, night sweats, weight loss, swollen lymph nodes, body aches, joint swelling, chest pain, shortness of breath, mood changes. POSITIVE muscle aches, fatigue  Objective  Blood pressure 116/72, height 5\' 10"  (1.778 m), weight 145 lb (65.8 kg).   General: No apparent distress alert and oriented x3 mood and affect normal, dressed appropriately.  HEENT: Pupils equal, extraocular movements intact  Respiratory: Patient's speak in full sentences and does not appear short of breath  Cardiovascular: No lower extremity edema, non tender, no erythema  Patient is sitting comfortably at the moment and is able to get out of a seated position without any significant difficulty but does have tightness with FABER test still.  Tightness of the adductor's noted.    Impression and Recommendations:    The above documentation has been reviewed and is accurate and complete Judi Saa, DO

## 2023-01-17 ENCOUNTER — Other Ambulatory Visit: Payer: Self-pay | Admitting: Neurology

## 2023-01-17 ENCOUNTER — Encounter: Payer: Self-pay | Admitting: Family Medicine

## 2023-01-17 ENCOUNTER — Ambulatory Visit: Payer: PPO | Admitting: Family Medicine

## 2023-01-17 VITALS — BP 116/72 | Ht 70.0 in | Wt 145.0 lb

## 2023-01-17 DIAGNOSIS — M5030 Other cervical disc degeneration, unspecified cervical region: Secondary | ICD-10-CM

## 2023-01-17 DIAGNOSIS — M6289 Other specified disorders of muscle: Secondary | ICD-10-CM

## 2023-01-17 DIAGNOSIS — E538 Deficiency of other specified B group vitamins: Secondary | ICD-10-CM

## 2023-01-17 DIAGNOSIS — M5416 Radiculopathy, lumbar region: Secondary | ICD-10-CM | POA: Diagnosis not present

## 2023-01-17 MED ORDER — CYANOCOBALAMIN 1000 MCG/ML IJ SOLN
1000.0000 ug | Freq: Once | INTRAMUSCULAR | Status: AC
Start: 2023-01-17 — End: 2023-01-17
  Administered 2023-01-17: 1000 ug via INTRAMUSCULAR

## 2023-01-17 NOTE — Assessment & Plan Note (Addendum)
Restart pelvic floor therapy.  Patient has make improvement.  Patient now has not been able to take care of himself as well.  Recently had a move.  Does feel though that if he could get started again with physical therapy he will make improvement.  Referred today and will follow-up with me again in 6 to 8 weeks otherwise.  Total time discussing with patient today 32 minutes

## 2023-01-17 NOTE — Assessment & Plan Note (Signed)
B12 injection given today.

## 2023-01-17 NOTE — Patient Instructions (Addendum)
PT will call you B12 injection today  See me again in 6 weeks

## 2023-01-22 ENCOUNTER — Ambulatory Visit: Payer: PPO | Attending: Family Medicine | Admitting: Physical Therapy

## 2023-01-22 ENCOUNTER — Other Ambulatory Visit: Payer: Self-pay

## 2023-01-22 DIAGNOSIS — M6289 Other specified disorders of muscle: Secondary | ICD-10-CM | POA: Insufficient documentation

## 2023-01-22 DIAGNOSIS — M5030 Other cervical disc degeneration, unspecified cervical region: Secondary | ICD-10-CM | POA: Insufficient documentation

## 2023-01-22 DIAGNOSIS — M6281 Muscle weakness (generalized): Secondary | ICD-10-CM | POA: Diagnosis not present

## 2023-01-22 DIAGNOSIS — M5416 Radiculopathy, lumbar region: Secondary | ICD-10-CM | POA: Insufficient documentation

## 2023-01-22 DIAGNOSIS — M542 Cervicalgia: Secondary | ICD-10-CM

## 2023-01-22 NOTE — Therapy (Signed)
OUTPATIENT PHYSICAL THERAPY CERVICAL EVALUATION   Patient Name: William [name] MRN: [id number] DOB:05/07/1960, 62 y.o., male Today's Date: 01/22/2023  END OF SESSION:  PT End of Session - 01/22/23 1018     Visit Number 1    Date for PT Re-Evaluation 03/19/23    Authorization Type Medicare [employer]    Progress Note Due on Visit 10    PT Start Time 1019    PT Stop Time 1058    PT Time Calculation (min) 39 min    Activity Tolerance Patient tolerated treatment well             Past Medical History:  Diagnosis Date   Allergy    Anxiety    Chronic kidney disease    Depression    Diverticulitis    Gastroparesis    Heart murmur    Migraine headache    Mitral valve prolapse    SVT (supraventricular tachycardia)    Past Surgical History:  Procedure Laterality Date   COLONOSCOPY W/ BIOPSIES  2023   RADIOACTIVE SEED IMPLANT N/A 01/19/2022   Procedure: RADIOACTIVE SEED IMPLANT/BRACHYTHERAPY IMPLANT;  Surgeon: Bjorn Pippin, MD;  Location: [facility];  Service: Urology;  Laterality: N/A;   SPACE OAR INSTILLATION N/A 01/19/2022   Procedure: SPACE OAR INSTILLATION;  Surgeon: Bjorn Pippin, MD;  Location: [facility];  Service: Urology;  Laterality: N/A;   Patient Active Problem List   Diagnosis Date Noted   Depression 10/11/2022   B12 deficiency 06/08/2022   Loss of weight 06/08/2022   Genetic testing 11/10/2021   Malignant neoplasm of prostate (HCC) 10/24/2021   Right calf pain 09/18/2021   Caregiver burden 10/18/2020   Arthritis of right acromioclavicular joint 02/18/2020   Right rotator cuff tear 12/18/2019   Degenerative cervical disc 05/07/2019   Left lumbar radiculopathy 10/10/2017   Migraine without aura and without status migrainosus, not intractable 06/22/2017   Vitamin D deficiency 05/28/2017   Scrotal pain 05/28/2017   Paroxysmal SVT (supraventricular tachycardia) 05/28/2017   Mild left inguinal hernia 11/23/2016    Chronic pelvic pain in male 09/28/2016   Abdominal pain, left lower quadrant 09/28/2016   Isolated proteinuria without specific morphologic lesion 06/27/2016   Left testicular pain 06/27/2016   Acute left flank pain 06/27/2016   Epididymitis, left 06/05/2016   Pelvic floor dysfunction 04/01/2016   GERD (gastroesophageal reflux disease) 10/13/2015   Gastroparesis 10/13/2015   Intractable chronic migraine without aura and without status migrainosus 12/15/2013   Hyperlipidemia 04/04/2012   Mitral valve disease 07/15/2009   IRRITABLE BOWEL SYNDROME 07/15/2009   Palpitations 06/28/2009    PCP: Farris Has MD  REFERRING PROVIDER: Antoine Primas DO  REFERRING DIAG: cervical degenerative disc; left lumbar radiculopathy  THERAPY DIAG:  Cervicalgia  Radiculopathy, lumbar region  Muscle weakness (generalized)  Rationale for Evaluation and Treatment: Rehabilitation  ONSET DATE: 1 month ago exacerbation   SUBJECTIVE:  SUBJECTIVE STATEMENT: Reports fatigue and depression.  Going to get B12 injections regularly over the next 6 weeks.  Just moved to a new place about a month ago (old place have mold).   I hurt my low back with moving. Not sleeping well, "I have another PSA test" next week (thinking about that at night). Currently just going to [employer], [employer] and the grocery store but not walking for recreation or ex.  No current ex program. 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 Yes NPRS scale: 7/10 neck; 8/10 back Pain location: bil low back; with left LE pain down to the bottom of my foot intermittently and better recently; bil neck and left UE comes and goes but bothersome Aggravating factors: bending/lifting wider moving boxes; turning head to change lanes; kneeling on the floor; propped on sofa Relieving factors: Tylenol but doesn't help much   PRECAUTIONS: None    WEIGHT BEARING RESTRICTIONS: No  FALLS:  Has patient fallen in last 6 months? No  LIVING ENVIRONMENT: Lives with: lives with their family Lives in: House/apartment  OCCUPATION: unable to work   PLOF: Independent with basic ADLs  PATIENT GOALS: improve fatigue; lessen back pain and neck pain; start exercising (pt mentions Tai Chi) has [employer] but has not been going  NEXT MD VISIT: regularly for B12 shots over the next 6 weeks  OBJECTIVE:   DIAGNOSTIC FINDINGS lumbar   11/14/2021  IMPRESSION: Degenerative changes at L4-L5 and L5-S1 resulting in moderate left worse than right neural foraminal stenosis at both levels and mild left worse than right subarticular zone narrowing at L5-S1 without frank impingement. Findings are not significantly changed since 2019. [name]   On: 11/14/2021 09:14 DIAGNOSTIC FINDINGS cervical :  FINDINGS: No radiographic evidence of cervical spine fracture. There is multilevel degenerative disc disease moderate at C4-C5 and mild-to-moderate at C3-C4 and C5-C6, and with posterior endplate spurring. There is mild multilevel facet arthropathy. No prevertebral soft tissue swelling.   IMPRESSION: Mild to moderate multilevel degenerative disc disease from C3 through C6.   Mild multilevel facet arthropathy.  PATIENT SURVEYS:  Modified Oswestry 48% moderate self perceived impairment   NDI 52% moderate to severe self perceived  impairment  COGNITION: Overall cognitive status: Within functional limits for tasks assessed   POSTURE: rounded shoulders and forward head    CERVICAL ROM:   Active ROM A/PROM (deg) eval  Flexion 55  Extension 40 feels tight  Right lateral flexion 28  Left lateral flexion 26  Right rotation 50  Left rotation 45   (Blank rows = not tested)  Deep cervical flexors and extensors grossly 4/5  UPPER EXTREMITY ROM: Pain with overhead in both shoulders  UPPER EXTREMITY MMT: Grossly 4/5 in glenohumeral and scapular muscles  LUMBAR ROM:   Active  A/PROM  eval  Flexion 40  Extension 15  Right lateral flexion 25  Left lateral flexion 22  Right rotation  Left rotation    (Blank rows = not tested  TRUNK STRENGTH:  Decreased activation of transverse abdominus muscles; abdominals 4-/5; decreased activation of lumbar multifidi; trunk extensors 4-/5  Bil hip abductors strength 4-/5 Bil quads 4+/5 (some extra effort to rise from chair without UE assist  FUNCTIONAL TESTS:  5x sit to stand 17.46 no hands 3 MWT 545   RPE 5/10  TODAY'S TREATMENT:                                                                                                                              DATE: 9/24   PATIENT EDUCATION:  Education details: Educated patient on initial HEP, prognosis, plan of care as well as initial self care strategies to promote recovery  Person educated: Patient Education method: Explanation Education comprehension: verbalized understanding  HOME EXERCISE PROGRAM: Access Code: [email or web address] URL: [email or web address] Date: 01/22/2023 Prepared by: Lavinia Sharps  Exercises - Sit to Stand with Arms Crossed  - 2 x daily - 7 x weekly - 2 sets - 5 reps - Standing Hip Abduction  - 2 x daily - 7 x weekly - 1 sets - 10 reps - Standing Hip Extension  - 2 x daily - 7 x weekly - 1 sets - 10 reps - Push ups on kitchen counter  - 2 x daily - 7 x weekly - 1 sets - 10  reps  ASSESSMENT:  CLINICAL IMPRESSION: Patient is a 62  y.o. male who was seen today for physical therapy evaluation and treatment for neck pain and lumbar radiculopathy. The patient is well-known to this facility and clinician from previous episodes of care.  Most recently he came in Spring/Summer for neck pain but missed sessions related to migraines and extreme fatigue related to his prostate cancer symptoms.  He reports an exacerbation after moving to a new apartment 1 month ago and lifting wider than normal boxes.  The patient would benefit from skilled PT to address decreased cervical range of motion, correct muscle strength asymmetries and weakness in deep cervical flexors and extensors, improve postural strength including and scapular retractor and depressors and address pain levels.  The patient also has lumbar range of motion deficits and strength asymmetries in lumbo/pelvic and hip regions.  All affect patient's ability to perform ADLS including turning head for driving, sitting on the sofa, kneeling on the floor and lifting/carrying items like moving boxes.    OBJECTIVE IMPAIRMENTS: decreased activity tolerance, decreased endurance, difficulty walking, decreased ROM, decreased strength, increased fascial restrictions, impaired perceived functional ability, and pain.   ACTIVITY LIMITATIONS: lifting, bending, standing, sleeping, locomotion level, and caring for others  PARTICIPATION LIMITATIONS: meal prep, cleaning, interpersonal relationship, driving, shopping, and community activity  PERSONAL FACTORS: Time since onset of injury/illness/exacerbation and 1-2 comorbidities: recovering from prostate cancer, migraines  are also affecting patient's functional outcome.   REHAB POTENTIAL: Good  CLINICAL DECISION MAKING: Stable/uncomplicated  EVALUATION COMPLEXITY: Low   GOALS: Goals reviewed with  patient? Yes  SHORT TERM GOALS: Target date: 02/19/2023   The patient will demonstrate  knowledge of basic self care strategies and exercises to promote healing  Baseline:  Goal status: INITIAL  2.  Improved LE strength needed to rise from a standard chair (without arm use) with ease Baseline:  Goal status: INITIAL  3.  Improved cervical ROM particularly rotation to 30 degrees needed for driving Baseline:  Goal status: INITIAL  4.  Patient will demonstrate good hip hinge and squat technique for moving boxes Baseline:  Goal status: INITIAL  5.  5x sit to stand time decreased to < 15 sec  Baseline:  Goal status: INITIAL   LONG TERM GOALS: Target date: 03/19/2023    The patient will be independent in a safe self progression of a home exercise program and/or ex class to promote further recovery of function  Baseline:  Goal status: INITIAL  2.  The patient will report a 60% improvement in pain levels with functional activities which are currently difficult including turning for driving, lifting moving boxes, kneeling and sitting on the sofa Baseline:  Goal status: INITIAL  3.  Improved upper and lower body strength needed to kneel to the floor and get back up with ease Baseline:  Goal status: INITIAL  4.  The patient will have improved gait stamina and speed needed to ambulate 1000 feet in 6 minutes  Baseline:  Goal status: INITIAL  5.  Neck Disability Index improved to 42% indicating improved function with less neck pain Baseline:  Goal status: INITIAL  6.  Modified Oswestry Index improved to 38% indicating improved function with less back pain Baseline:  Goal status: INITIAL   PLAN:  PT FREQUENCY: 2x/week  PT DURATION: 8 weeks  PLANNED INTERVENTIONS: Therapeutic exercises, Therapeutic activity, Neuromuscular re-education, Patient/Family education, Self Care, Joint mobilization, Aquatic Therapy, Dry Needling, Electrical stimulation, Spinal mobilization, Cryotherapy, Moist heat, Taping, Traction, Ultrasound, Ionotophoresis 4mg /ml Dexamethasone, Manual  therapy, and Re-evaluation  PLAN FOR NEXT SESSION: review initial HEP and progress; Nu-Step; core exercise; cervical rotation ROM (for driving); general UE and LE strengthening; rows and lat pulls; recommend starting with intentional underdosing of ex secondary to high fatigue levels and fearfulness    Lavinia Sharps, PT 01/22/23 11:31 AM Phone: [phone] Fax: [phone]

## 2023-01-23 ENCOUNTER — Ambulatory Visit: Payer: PPO | Admitting: Physical Therapy

## 2023-01-23 DIAGNOSIS — R300 Dysuria: Secondary | ICD-10-CM | POA: Diagnosis not present

## 2023-01-24 ENCOUNTER — Ambulatory Visit: Payer: PPO | Admitting: Physical Therapy

## 2023-01-28 ENCOUNTER — Telehealth: Payer: Self-pay | Admitting: Family Medicine

## 2023-01-28 NOTE — Telephone Encounter (Signed)
Patient called asking if it was still okay with Dr Katrinka Blazing for him to have the PSA lab done here? (Order is in from 8/15)  He said that he has not had a good experience at the other lab he used for his urologist.   Please advise.

## 2023-01-29 ENCOUNTER — Other Ambulatory Visit (INDEPENDENT_AMBULATORY_CARE_PROVIDER_SITE_OTHER): Payer: PPO

## 2023-01-29 DIAGNOSIS — C61 Malignant neoplasm of prostate: Secondary | ICD-10-CM | POA: Diagnosis not present

## 2023-01-29 LAB — PSA: PSA: 1.17 ng/mL (ref 0.10–4.00)

## 2023-01-29 NOTE — Telephone Encounter (Signed)
Pt had labs done here 01/29/2023, requests results faxed to: [facility] Dr. Bjorn Pippin Fax [phone]

## 2023-01-29 NOTE — Telephone Encounter (Signed)
Patient aware.

## 2023-01-29 NOTE — Telephone Encounter (Signed)
Faxed

## 2023-02-01 ENCOUNTER — Ambulatory Visit (INDEPENDENT_AMBULATORY_CARE_PROVIDER_SITE_OTHER): Payer: PPO

## 2023-02-01 DIAGNOSIS — E538 Deficiency of other specified B group vitamins: Secondary | ICD-10-CM

## 2023-02-01 MED ORDER — CYANOCOBALAMIN 1000 MCG/ML IJ SOLN
1000.0000 ug | Freq: Once | INTRAMUSCULAR | Status: AC
Start: 2023-02-01 — End: 2023-02-01
  Administered 2023-02-01: 1000 ug via INTRAMUSCULAR

## 2023-02-01 NOTE — Progress Notes (Signed)
Patient received B12 injection per Dr. Katrinka Blazing. Patient tolerated injection well.

## 2023-02-06 DIAGNOSIS — N401 Enlarged prostate with lower urinary tract symptoms: Secondary | ICD-10-CM | POA: Diagnosis not present

## 2023-02-06 DIAGNOSIS — N3941 Urge incontinence: Secondary | ICD-10-CM | POA: Diagnosis not present

## 2023-02-06 DIAGNOSIS — R300 Dysuria: Secondary | ICD-10-CM | POA: Diagnosis not present

## 2023-02-06 DIAGNOSIS — C61 Malignant neoplasm of prostate: Secondary | ICD-10-CM | POA: Diagnosis not present

## 2023-02-07 ENCOUNTER — Ambulatory Visit: Payer: PPO | Admitting: Family Medicine

## 2023-02-07 DIAGNOSIS — E559 Vitamin D deficiency, unspecified: Secondary | ICD-10-CM | POA: Diagnosis not present

## 2023-02-07 DIAGNOSIS — R251 Tremor, unspecified: Secondary | ICD-10-CM | POA: Diagnosis not present

## 2023-02-07 DIAGNOSIS — C61 Malignant neoplasm of prostate: Secondary | ICD-10-CM | POA: Diagnosis not present

## 2023-02-07 DIAGNOSIS — F411 Generalized anxiety disorder: Secondary | ICD-10-CM | POA: Diagnosis not present

## 2023-02-08 ENCOUNTER — Ambulatory Visit: Payer: PPO | Admitting: Physical Therapy

## 2023-02-11 DIAGNOSIS — R251 Tremor, unspecified: Secondary | ICD-10-CM | POA: Diagnosis not present

## 2023-02-11 DIAGNOSIS — E559 Vitamin D deficiency, unspecified: Secondary | ICD-10-CM | POA: Diagnosis not present

## 2023-02-12 ENCOUNTER — Ambulatory Visit: Payer: PPO | Admitting: Physical Therapy

## 2023-02-12 DIAGNOSIS — F430 Acute stress reaction: Secondary | ICD-10-CM | POA: Diagnosis not present

## 2023-02-14 ENCOUNTER — Ambulatory Visit: Payer: PPO | Admitting: Family Medicine

## 2023-02-14 NOTE — Progress Notes (Signed)
[name] [address] [place] [address] Phone: [phone] Subjective:   William [name], am serving as a scribe for Dr. Antoine Primas.  I'm seeing this patient by the request  of:  Farris Has, MD  CC: back and neck pain   [id number]  William [name] is a 62 y.o. male coming in with complaint of back and neck pain. OMT 01/17/2023. Patient states follow up on status. Has some PSA numbers. Would like b12 injection?  Patient's PSA has been stable at 1.1 still having difficulty feeling better.  Medications patient has been prescribed: None  Taking:      Patient did have another PSA measurement 2 weeks ago that showed 1.17.  This is slowly been trending up since his surgery.  This started at 0.99 over the last 2 years   Reviewed prior external information including notes and imaging from previsou exam, outside providers and external EMR if available.   As well as notes that were available from care everywhere and other healthcare systems.  Past medical history, social, surgical and family history all reviewed in electronic medical record.  No pertanent information unless stated regarding to the chief complaint.   Past Medical History:  Diagnosis Date   Allergy    Anxiety    Chronic kidney disease    Depression    Diverticulitis    Gastroparesis    Heart murmur    Migraine headache    Mitral valve prolapse    SVT (supraventricular tachycardia)     Allergies  Allergen Reactions   Erythromycin Anaphylaxis and Nausea And Vomiting   Nalbuphine Other (See Comments)    States loss of consciousness    Cefdinir Diarrhea, Nausea And Vomiting and Rash   Cephalexin Diarrhea and Nausea And Vomiting   Cephalosporins Diarrhea and Nausea And Vomiting   Hydrocodone-Acetaminophen Nausea And Vomiting   Ketorolac Nausea And Vomiting    Other reaction(s): upset stomach   Prochlorperazine Edisylate Other (See Comments)     uncontrollable shake - tremor    Restasis [Cyclosporine] Other (See Comments)    migraines   Tyloxapol Swelling   Bacitracin-Polymyxin B     Other reaction(s): rash gets worse Other reaction(s): rash gets worse   Codeine Other (See Comments)    Other reaction(s): stomach upset   Nalbuphine Hcl     Other reaction(s): stomach upset   Prochlorperazine     Other reaction(s): tremors/vomiting   Propranolol Diarrhea   Rosuvastatin Diarrhea   Topamax [Topiramate] Diarrhea and Nausea And Vomiting   Zetia [Ezetimibe] Diarrhea   Cefdinir Diarrhea, Nausea And Vomiting and Rash    Other reaction(s): too stronge   Doxycycline Hyclate Nausea Only and Other (See Comments)    abd pain   Iodinated Contrast Media Other (See Comments)    Lightheaded, flushed feeling (explained to patient these are normal side effects of IV iodinated contrast, not allergies, but he wanted this noted in the epic; he tolerated epidural steroid injections w/ contrast w/o difficulty)   Ketoconazole Itching   Ketorolac Tromethamine Rash    REACTION: Reaction not known   Latex Itching and Rash   Neosporin [Neomycin-Bacitracin Zn-Polymyx] Rash   Oxycodone-Acetaminophen Other (See Comments)    Tylox caused constipation   Prednisone Nausea And Vomiting    Oral route, only Other reaction(s): stomach spasms     Review of Systems:  No headache, visual changes, nausea, vomiting, diarrhea, constipation, dizziness, abdominal pain, skin rash, fevers, chills, night sweats,  weight loss, swollen lymph nodes, body aches, joint swelling, chest pain, shortness of breath, mood changes. POSITIVE muscle aches  Objective  Blood pressure 124/80, pulse 90, height 5\' 10"  (1.778 m), weight 146 lb (66.2 kg), SpO2 96%.   General: No apparent distress alert and oriented x3 mood and affect normal, dressed appropriately.  HEENT: Pupils equal, extraocular movements intact  Respiratory: Patient's speak in full sentences and does not appear  short of breath  Cardiovascular: No lower extremity edema, non tender, no erythema  Sitting comfortably.  Still with some mild discomfort with getting up from a seated position.  Coherently patient is perfect        Assessment and Plan:  Chronic pelvic pain in male Continues to have discomfort with increasing activity and still having significant anxiety with patient still not feeling like he has made quite the right decision with his prostate cancer.  He we tried to tell patient that his PSA is within a normal range and at the moment.  With that I do feel that patient should do relatively well.  Discussed icing regimen and home exercises, discussed which activities to do and which ones to avoid.  Increase activity slowly otherwise.  Follow-up with me again in 6 to 8 weeks total time with patient again 31 minutes  B12 deficiency Repeat B12 injection given today and we will do another 1 in 2 weeks and then go back to once monthly         The above documentation has been reviewed and is accurate and complete Judi Saa, DO         Note: This dictation was prepared with Dragon dictation along with smaller phrase technology. Any transcriptional errors that result from this process are unintentional.

## 2023-02-15 ENCOUNTER — Encounter: Payer: Self-pay | Admitting: Family Medicine

## 2023-02-15 ENCOUNTER — Ambulatory Visit: Payer: PPO | Admitting: Family Medicine

## 2023-02-15 VITALS — BP 124/80 | HR 90 | Ht 70.0 in | Wt 146.0 lb

## 2023-02-15 DIAGNOSIS — E538 Deficiency of other specified B group vitamins: Secondary | ICD-10-CM

## 2023-02-15 DIAGNOSIS — G8929 Other chronic pain: Secondary | ICD-10-CM | POA: Diagnosis not present

## 2023-02-15 DIAGNOSIS — C61 Malignant neoplasm of prostate: Secondary | ICD-10-CM | POA: Diagnosis not present

## 2023-02-15 DIAGNOSIS — R102 Pelvic and perineal pain: Secondary | ICD-10-CM

## 2023-02-15 MED ORDER — CYANOCOBALAMIN 1000 MCG/ML IJ SOLN
1000.0000 ug | Freq: Once | INTRAMUSCULAR | Status: AC
Start: 2023-02-15 — End: 2023-02-15
  Administered 2023-02-15: 1000 ug via INTRAMUSCULAR

## 2023-02-15 NOTE — Assessment & Plan Note (Signed)
Repeat B12 injection given today and we will do another 1 in 2 weeks and then go back to once monthly

## 2023-02-15 NOTE — Assessment & Plan Note (Signed)
Continues to have discomfort with increasing activity and still having significant anxiety with patient still not feeling like he has made quite the right decision with his prostate cancer.  He we tried to tell patient that his PSA is within a normal range and at the moment.  With that I do feel that patient should do relatively well.  Discussed icing regimen and home exercises, discussed which activities to do and which ones to avoid.  Increase activity slowly otherwise.  Follow-up with me again in 6 to 8 weeks total time with patient again 31 minutes

## 2023-02-15 NOTE — Patient Instructions (Signed)
Good to see you! We will do another PSA end of NOV Continue to stay active Take care of yourself See you again in 2-3 months

## 2023-02-20 ENCOUNTER — Ambulatory Visit: Payer: PPO | Attending: Family Medicine | Admitting: Physical Therapy

## 2023-02-20 DIAGNOSIS — M5416 Radiculopathy, lumbar region: Secondary | ICD-10-CM | POA: Diagnosis not present

## 2023-02-20 DIAGNOSIS — M542 Cervicalgia: Secondary | ICD-10-CM | POA: Diagnosis not present

## 2023-02-20 NOTE — Therapy (Addendum)
 OUTPATIENT PHYSICAL THERAPY CERVICAL PROGRESS NOTE/DISCHARGE SUMMARY   Patient Name: William [name] MRN: [id number] DOB:[birth year], 62 y.o., male Today's Date: 02/20/2023  END OF SESSION:  PT End of Session - 02/20/23 1014     Visit Number 2    Date for PT Re-Evaluation 03/19/23    Authorization Type Medicare [employer]    Progress Note Due on Visit 10    PT Start Time 1015    PT Stop Time 1100    PT Time Calculation (min) 45 min    Activity Tolerance Patient tolerated treatment well             Past Medical History:  Diagnosis Date   Allergy    Anxiety    Chronic kidney disease    Depression    Diverticulitis    Gastroparesis    Heart murmur    Migraine headache    Mitral valve prolapse    SVT (supraventricular tachycardia) (HCC)    Past Surgical History:  Procedure Laterality Date   COLONOSCOPY W/ BIOPSIES  2023   RADIOACTIVE SEED IMPLANT N/A 01/19/2022   Procedure: RADIOACTIVE SEED IMPLANT/BRACHYTHERAPY IMPLANT;  Surgeon: Bjorn Pippin, MD;  Location: [facility];  Service: Urology;  Laterality: N/A;   SPACE OAR INSTILLATION N/A 01/19/2022   Procedure: SPACE OAR INSTILLATION;  Surgeon: Bjorn Pippin, MD;  Location: [facility];  Service: Urology;  Laterality: N/A;   Patient Active Problem List   Diagnosis Date Noted   Depression 10/11/2022   B12 deficiency 06/08/2022   Loss of weight 06/08/2022   Genetic testing 11/10/2021   Malignant neoplasm of prostate (HCC) 10/24/2021   Right calf pain 09/18/2021   Caregiver burden 10/18/2020   Arthritis of right acromioclavicular joint 02/18/2020   Right rotator cuff tear 12/18/2019   Degenerative cervical disc 05/07/2019   Left lumbar radiculopathy 10/10/2017   Migraine without aura and without status migrainosus, not intractable 06/22/2017   Vitamin D deficiency 05/28/2017   Scrotal pain 05/28/2017   Paroxysmal SVT (supraventricular tachycardia) (HCC) 05/28/2017   Mild  left inguinal hernia 11/23/2016   Chronic pelvic pain in male 09/28/2016   Abdominal pain, left lower quadrant 09/28/2016   Isolated proteinuria without specific morphologic lesion 06/27/2016   Left testicular pain 06/27/2016   Acute left flank pain 06/27/2016   Epididymitis, left 06/05/2016   Pelvic floor dysfunction 04/01/2016   GERD (gastroesophageal reflux disease) 10/13/2015   Gastroparesis 10/13/2015   Intractable chronic migraine without aura and without status migrainosus 12/15/2013   Hyperlipidemia 04/04/2012   Mitral valve disease 07/15/2009   IRRITABLE BOWEL SYNDROME 07/15/2009   Palpitations 06/28/2009    PCP: Farris Has MD  REFERRING PROVIDER: Antoine Primas DO  REFERRING DIAG: cervical degenerative disc; left lumbar radiculopathy  THERAPY DIAG:  Cervicalgia  Radiculopathy, lumbar region  Rationale for Evaluation and Treatment: Rehabilitation  ONSET DATE: 1 month ago exacerbation   SUBJECTIVE:  SUBJECTIVE STATEMENT: Patient requests no bike b/c may affect PSA levels.    Getting B12 injections regularly every 2 weeks.   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 Yes NPRS scale: 0/10 neck/ back Pain location: bil low back; with left LE pain down to the bottom of my foot intermittently and better recently; bil neck and left UE comes and goes but  bothersome Aggravating factors: bending/lifting wider moving boxes; turning head to change lanes; kneeling on the floor; propped on sofa Relieving factors: Tylenol but doesn't help much   PRECAUTIONS: None    WEIGHT BEARING RESTRICTIONS: No  FALLS:  Has patient fallen in last 6 months? No  LIVING ENVIRONMENT: Lives with: lives with their family Lives in: House/apartment  OCCUPATION: unable to work   PLOF: Independent with basic ADLs  PATIENT GOALS: improve fatigue; lessen back pain and neck pain; start exercising (pt mentions Tai Chi) has [employer] but has not been going  NEXT MD VISIT: regularly for B12 shots   OBJECTIVE:   DIAGNOSTIC FINDINGS lumbar   11/14/2021  IMPRESSION: Degenerative changes at L4-L5 and L5-S1 resulting in moderate left worse than right neural foraminal stenosis at both levels and mild left worse than right subarticular zone narrowing at L5-S1 without frank impingement. Findings are not significantly changed since 2019. [name]   On: 11/14/2021 09:14 DIAGNOSTIC FINDINGS cervical :  FINDINGS: No radiographic evidence of cervical spine fracture. There is multilevel degenerative disc disease moderate at C4-C5 and mild-to-moderate at C3-C4 and C5-C6, and with posterior endplate spurring. There is mild multilevel facet arthropathy. No prevertebral soft tissue swelling.   IMPRESSION: Mild to moderate multilevel degenerative disc disease from C3 through C6.   Mild multilevel facet arthropathy.  PATIENT SURVEYS:  Modified Oswestry 48% moderate self perceived impairment   NDI 52% moderate to severe self perceived impairment  COGNITION: Overall cognitive status: Within functional limits for tasks assessed   POSTURE: rounded shoulders and forward head    CERVICAL ROM:   Active ROM A/PROM (deg) eval  Flexion 55  Extension 40 feels tight  Right lateral flexion 28  Left lateral flexion 26  Right rotation 50  Left rotation 45   (Blank rows =  not tested)  Deep cervical flexors and extensors grossly 4/5  UPPER EXTREMITY ROM: Pain with overhead in both shoulders  UPPER EXTREMITY MMT: Grossly 4/5 in glenohumeral and scapular muscles  LUMBAR ROM:   Active  A/PROM  eval  Flexion 40  Extension 15  Right lateral flexion 25  Left lateral flexion 22  Right rotation   Left rotation    (Blank rows = not tested  TRUNK STRENGTH:  Decreased activation of transverse abdominus muscles; abdominals 4-/5; decreased activation of lumbar multifidi; trunk extensors 4-/5  Bil hip abductors strength 4-/5 Bil quads 4+/5 (some extra effort to rise from chair without UE assist  FUNCTIONAL TESTS:  5x sit to stand 17.46 no hands  3 MWT 545   RPE 5/10  TODAY'S TREATMENT:                                                                                                                              DATE: 10/23 Standing UBE (recommended 4 min at the start but pt feels he can do 6 min) Lat bar 15x (starting at 30# but pt requests to increase) 35#  Push press 7# for functional overhead reaching 2x 10 right/left Discussion on lifting at [employer] into the shopping basket flares his pain(squat to bottom rack of cart will keep the load closer to the body) Hip hinge using golf club for feedback (cues for hips back, less knee flexion) 7# bil weights slide down thighs 10x Sit to stand no Ues x5 RPE 8/10  PATIENT EDUCATION:  Education details: Educated patient on initial HEP, prognosis, plan of care as well as initial self care strategies to promote recovery  Person educated: Patient Education method: Explanation Education comprehension: verbalized understanding  HOME EXERCISE PROGRAM: Access Code: [email or web address] URL: [email or web address] Date: 02/20/2023 Prepared by: Lavinia Sharps  Exercises - Sit to Stand with Arms Crossed  - 2 x daily - 7 x weekly - 2 sets - 5 reps - Standing Hip Abduction  - 2 x daily - 7 x weekly - 1 sets - 10 reps -  Standing Hip Extension  - 2 x daily - 7 x weekly - 1 sets - 10 reps - Push ups on kitchen counter  - 2 x daily - 7 x weekly - 1 sets - 10 reps - Standing Hip Hinge with Dowel  - 1 x daily - 7 x weekly - 1 sets - 10 reps - Standing Overhead Press with Dumbbells at [employer]  - 1 x daily - 7 x weekly - 1 sets - 10 reps Access Code: [email or web address] URL: [email or web address] Date: 01/22/2023 Prepared by: Lavinia Sharps  Exercises - Sit to Stand with Arms Crossed  - 2 x daily - 7 x weekly - 2 sets - 5 reps - Standing Hip Abduction  - 2 x daily - 7 x weekly - 1 sets - 10 reps - Standing Hip Extension  - 2 x daily - 7 x weekly - 1 sets - 10 reps - Push ups on kitchen counter  - 2 x daily - 7 x weekly - 1 sets - 10 reps  ASSESSMENT:  CLINICAL IMPRESSION: The patient returns after 1 month since initial eval therefore STGS not met.  He states he's worried a lot about his PSA levels and has spoken to the doctor about his concerns.  He will have a retest at some point and was told by his doctor to avoid cycling as this could elevate levels at testing time.  He is able to perform ex's without pain production in neck and back but he does have a sudden onset of extreme fatigue and rates his perceived exertion level high at 8/10.  Pt receptive to lifting strategies especially keeping the load close to the body "space=weakness" principle.    OBJECTIVE IMPAIRMENTS: decreased activity tolerance, decreased endurance, difficulty walking, decreased ROM, decreased strength, increased fascial restrictions, impaired perceived functional ability, and pain.   ACTIVITY LIMITATIONS: lifting, bending, standing, sleeping, locomotion level, and caring for others  PARTICIPATION LIMITATIONS: meal prep, cleaning, interpersonal relationship, driving, shopping, and community activity  PERSONAL FACTORS: Time since onset of injury/illness/exacerbation and 1-2 comorbidities: recovering from prostate cancer, migraines  are also  affecting patient's functional outcome.   REHAB POTENTIAL: Good  CLINICAL DECISION MAKING: Stable/uncomplicated  EVALUATION COMPLEXITY: Low   GOALS: Goals reviewed with patient? Yes  SHORT TERM GOALS: Target date: 02/19/2023   The patient will demonstrate knowledge of basic self care strategies and exercises to promote healing  Baseline:  Goal status: INITIAL  2.  Improved LE strength needed to rise from a standard chair (without arm use) with ease Baseline:  Goal status: INITIAL  3.  Improved cervical ROM particularly rotation to 30 degrees needed for driving Baseline:  Goal status: INITIAL  4.  Patient will demonstrate good hip hinge and squat technique for moving boxes Baseline:  Goal status: INITIAL  5.  5x sit to stand time decreased to < 15 sec  Baseline:  Goal status: INITIAL   LONG TERM GOALS: Target date: 03/19/2023    The patient will be independent in a safe self progression of a home exercise program and/or ex class to promote further recovery of function  Baseline:  Goal status: INITIAL  2.  The patient will report a 60% improvement in pain levels with functional activities which are currently difficult including turning for driving, lifting moving boxes, kneeling and sitting on the sofa Baseline:  Goal status: INITIAL  3.  Improved upper and lower body strength needed to kneel to the floor and get back up with ease Baseline:  Goal status: INITIAL  4.  The patient will have improved gait stamina and speed needed to ambulate 1000 feet in 6 minutes  Baseline:  Goal status: INITIAL  5.  Neck Disability Index improved to 42% indicating improved function with less neck pain Baseline:  Goal status: INITIAL  6.  Modified Oswestry Index improved to 38% indicating improved function with less back pain Baseline:  Goal status: INITIAL   PLAN:  PT FREQUENCY: 2x/week  PT DURATION: 8 weeks  PLANNED INTERVENTIONS: Therapeutic exercises,  Therapeutic activity, Neuromuscular re-education, Patient/Family education, Self Care, Joint mobilization, Aquatic Therapy, Dry Needling, Electrical stimulation, Spinal mobilization, Cryotherapy, Moist heat, Taping, Traction, Ultrasound, Ionotophoresis 4mg /ml Dexamethasone, Manual therapy, and Re-evaluation  PLAN FOR NEXT SESSION: hold on Nu-Step or bike secondary to concerns about elevating PSA levels prior to testing; core exercise; cervical rotation ROM (for driving); general UE and LE strengthening; rows and lat pulls; recommend starting with intentional underdosing of ex secondary to high fatigue levels and fearfulness   Lavinia Sharps, PT 02/20/23 7:04 PM Phone: [phone] Fax: [phone] PHYSICAL THERAPY DISCHARGE SUMMARY  Visits from Start of Care: 2  Current functional level related to goals / functional outcomes: Pt did not return, other medical issues   Remaining deficits: As above   Education / Equipment: HEP   Patient agrees to discharge. Patient goals were not met. Patient is being discharged due to not returning since the last visit. Lavinia Sharps, PT 07/11/23 6:56 PM Phone: [phone] Fax: [phone]

## 2023-02-22 ENCOUNTER — Encounter: Payer: PPO | Admitting: Physical Therapy

## 2023-02-27 ENCOUNTER — Encounter: Payer: PPO | Admitting: Physical Therapy

## 2023-03-01 ENCOUNTER — Encounter: Payer: PPO | Admitting: Physical Therapy

## 2023-03-01 ENCOUNTER — Telehealth: Payer: Self-pay | Admitting: Physical Therapy

## 2023-03-01 NOTE — Telephone Encounter (Signed)
Pt called regarding his recent cancellation of PT visits. He states he's had migraines along with extreme fatigue (upcoming B12 shot on Monday).  He states he's also been talking to his doctor regarding palpatations. He also reports he hurt himself picking up a case of water.   He is considering pelvic floor PT as well (prostate cancer)

## 2023-03-04 ENCOUNTER — Ambulatory Visit (INDEPENDENT_AMBULATORY_CARE_PROVIDER_SITE_OTHER): Payer: PPO

## 2023-03-04 DIAGNOSIS — E538 Deficiency of other specified B group vitamins: Secondary | ICD-10-CM

## 2023-03-04 MED ORDER — CYANOCOBALAMIN 1000 MCG/ML IJ SOLN
1000.0000 ug | Freq: Once | INTRAMUSCULAR | Status: AC
Start: 2023-03-04 — End: 2023-03-04
  Administered 2023-03-04: 1000 ug via INTRAMUSCULAR

## 2023-03-04 NOTE — Progress Notes (Signed)
Cyanocobalamin 1,000 mg/ML, given IM, Right deltoid. Pt tolerated well.

## 2023-03-07 DIAGNOSIS — K6289 Other specified diseases of anus and rectum: Secondary | ICD-10-CM | POA: Diagnosis not present

## 2023-03-07 DIAGNOSIS — R300 Dysuria: Secondary | ICD-10-CM | POA: Diagnosis not present

## 2023-03-07 DIAGNOSIS — F411 Generalized anxiety disorder: Secondary | ICD-10-CM | POA: Diagnosis not present

## 2023-03-13 ENCOUNTER — Ambulatory Visit: Payer: PPO | Admitting: Physical Therapy

## 2023-03-13 DIAGNOSIS — R300 Dysuria: Secondary | ICD-10-CM | POA: Diagnosis not present

## 2023-03-14 ENCOUNTER — Other Ambulatory Visit (INDEPENDENT_AMBULATORY_CARE_PROVIDER_SITE_OTHER): Payer: PPO

## 2023-03-14 DIAGNOSIS — C61 Malignant neoplasm of prostate: Secondary | ICD-10-CM

## 2023-03-14 LAB — PSA: PSA: 2.2 ng/mL (ref 0.10–4.00)

## 2023-03-15 ENCOUNTER — Encounter: Payer: PPO | Admitting: Physical Therapy

## 2023-03-15 ENCOUNTER — Other Ambulatory Visit: Payer: Self-pay | Admitting: Cardiovascular Disease

## 2023-03-15 NOTE — Progress Notes (Unsigned)
[name] [address] [place] [address] Phone: [phone] Subjective:   [name], am serving as a scribe for Dr. Antoine Primas.  I'm seeing this patient by the request  of:  Farris Has, MD  CC:   [id number]  02/15/2023 Chronic pelvic pain in male Continues to have discomfort with increasing activity and still having significant anxiety with patient still not feeling like he has made quite the right decision with his prostate cancer.  He we tried to tell patient that his PSA is within a normal range and at the moment.  With that I do feel that patient should do relatively well.  Discussed icing regimen and home exercises, discussed which activities to do and which ones to avoid.  Increase activity slowly otherwise.  Follow-up with me again in 6 to 8 weeks total time with patient again 31 minutes   B12 deficiency Repeat B12 injection given today and we will do another 1 in 2 weeks and then go back to once monthly  Update 03/19/2023 William [name] is a 62 y.o. male coming in with complaint of LBP. Patient states continues to have some discomfort.  Starting to affect daily activities and waking him up at night.  Has noticed some mild increase in low back pain and weight loss.      Past Medical History:  Diagnosis Date   Allergy    Anxiety    Chronic kidney disease    Depression    Diverticulitis    Gastroparesis    Heart murmur    Migraine headache    Mitral valve prolapse    SVT (supraventricular tachycardia) (HCC)    Past Surgical History:  Procedure Laterality Date   COLONOSCOPY W/ BIOPSIES  2023   RADIOACTIVE SEED IMPLANT N/A 01/19/2022   Procedure: RADIOACTIVE SEED IMPLANT/BRACHYTHERAPY IMPLANT;  Surgeon: Bjorn Pippin, MD;  Location: [facility];  Service: Urology;  Laterality: N/A;   SPACE OAR INSTILLATION N/A 01/19/2022   Procedure: SPACE OAR INSTILLATION;  Surgeon: Bjorn Pippin, MD;  Location:  [facility];  Service: Urology;  Laterality: N/A;   Social History   Socioeconomic History   Marital status: Married    Spouse name: Not on file   Number of children: 0   Years of education: Not on file   Highest education level: Bachelor's degree (e.g., BA, AB, BS)  Occupational History   Not on file  Tobacco Use   Smoking status: Never   Smokeless tobacco: Never  Vaping Use   Vaping status: Never Used  Substance and Sexual Activity   Alcohol use: No    Alcohol/week: 0.0 standard drinks of alcohol   Drug use: No   Sexual activity: Yes    Partners: Female  Other Topics Concern   Not on file  Social History Narrative   Pt is married lives with spouse in 1 story apartment he has No children   BS degree- he is right handed   Drinks soda  Ginger ale, no coffee no tea   Right handed   Social Determinants of Health   Financial Resource Strain: Low Risk  (10/25/2021)   Overall Financial Resource Strain (CARDIA)    Difficulty of Paying Living Expenses: Not hard at all  Food Insecurity: No Food Insecurity (10/25/2021)   Hunger Vital Sign    Worried About Running Out of Food in the Last Year: Never true    Ran Out of Food in the Last  Year: Never true  Transportation Needs: No Transportation Needs (10/25/2021)   PRAPARE - Administrator, Civil Service (Medical): No    Lack of Transportation (Non-Medical): No  Physical Activity: Not on file  Stress: Stress Concern Present (10/25/2021)   [employer] of Occupational Health - Occupational Stress Questionnaire    Feeling of Stress : Rather much  Social Connections: Not on file   Allergies  Allergen Reactions   Erythromycin Anaphylaxis and Nausea And Vomiting   Nalbuphine Other (See Comments)    States loss of consciousness    Cefdinir Diarrhea, Nausea And Vomiting and Rash   Cephalexin Diarrhea and Nausea And Vomiting   Cephalosporins Diarrhea and Nausea And Vomiting   Hydrocodone-Acetaminophen  Nausea And Vomiting   Ketorolac Nausea And Vomiting    Other reaction(s): upset stomach   Prochlorperazine Edisylate Other (See Comments)    uncontrollable shake - tremor    Restasis [Cyclosporine] Other (See Comments)    migraines   Tyloxapol Swelling   Bacitracin-Polymyxin B     Other reaction(s): rash gets worse Other reaction(s): rash gets worse   Codeine Other (See Comments)    Other reaction(s): stomach upset   Nalbuphine Hcl     Other reaction(s): stomach upset   Prochlorperazine     Other reaction(s): tremors/vomiting   Propranolol Diarrhea   Rosuvastatin Diarrhea   Topamax [Topiramate] Diarrhea and Nausea And Vomiting   Zetia [Ezetimibe] Diarrhea   Cefdinir Diarrhea, Nausea And Vomiting and Rash    Other reaction(s): too stronge   Doxycycline Hyclate Nausea Only and Other (See Comments)    abd pain   Iodinated Contrast Media Other (See Comments)    Lightheaded, flushed feeling (explained to patient these are normal side effects of IV iodinated contrast, not allergies, but he wanted this noted in the epic; he tolerated epidural steroid injections w/ contrast w/o difficulty)   Ketoconazole Itching   Ketorolac Tromethamine Rash    REACTION: Reaction not known   Latex Itching and Rash   Neosporin [Neomycin-Bacitracin Zn-Polymyx] Rash   Oxycodone-Acetaminophen Other (See Comments)    Tylox caused constipation   Prednisone Nausea And Vomiting    Oral route, only Other reaction(s): stomach spasms   Family History  Problem Relation Age of Onset   Hypertension Mother    Lung cancer Father 67 - 61   Hypertension Brother    Kidney failure Maternal Grandmother    Prostate cancer Neg Hx    Kidney cancer Neg Hx      Current Outpatient Medications (Cardiovascular):    atorvastatin (LIPITOR) 40 MG tablet, Take 1 tablet (40 mg total) by mouth daily.   diltiazem (CARDIZEM CD) 120 MG 24 hr capsule, TAKE ONE CAPSULE BY MOUTH DAILY   diltiazem (CARDIZEM) 30 MG tablet, TAKE  (1) TABLET AS NEEDED FOR PALPITATIONS  Current Outpatient Medications (Respiratory):    fluticasone (FLONASE) 50 MCG/ACT nasal spray, Place 2 sprays into both nostrils daily.  Current Outpatient Medications (Analgesics):    aspirin EC 81 MG tablet, Take 1 tablet (81 mg total) by mouth daily. Swallow whole.   EMGALITY 120 MG/ML SOAJ, INJECT 120MG  (1 PEN) UNDER THE SKIN ONCE A MONTH   Rimegepant Sulfate (NURTEC) 75 MG TBDP, Take 75 mg by mouth as needed for up to 1 dose (Maximum 1 tablet in 24 hours.).   Current Outpatient Medications (Other):    baclofen (LIORESAL) 10 MG tablet, Take 0.5 tablets (5 mg total) by mouth at bedtime as needed for muscle spasms.  clobetasol (TEMOVATE) 0.05 % external solution, Apply 1 Application topically 2 (two) times daily. Apply to scalp x 2 weeks prn itch   clobetasol cream (TEMOVATE) 0.05 %, Apply 1 Application topically 2 (two) times daily. To hands and feet x 2 weeks prn   hydrOXYzine (ATARAX) 50 MG tablet, Take 25-50 mg by mouth 2 (two) times daily as needed for itching.   metoCLOPramide (REGLAN) 5 MG tablet, Take 5 mg by mouth as needed.   mirtazapine (REMERON) 15 MG tablet, Take 1 tablet (15 mg total) by mouth at bedtime.   ondansetron (ZOFRAN-ODT) 8 MG disintegrating tablet, Take 8 mg by mouth 3 (three) times daily as needed.   polyethylene glycol powder (GLYCOLAX/MIRALAX) 17 GM/SCOOP powder, Take 17 g by mouth as needed for moderate constipation.   traZODone (DESYREL) 50 MG tablet, Take 1 tablet (50 mg total) by mouth at bedtime as needed for sleep.   Reviewed prior external information including notes and imaging from  primary care provider As well as notes that were available from care everywhere and other healthcare systems.  Past medical history, social, surgical and family history all reviewed in electronic medical record.  No pertanent information unless stated regarding to the chief complaint.   Review of Systems:  No headache, visual  changes, nausea, vomiting, diarrhea, constipation, dizziness, abdominal pain, skin rash, fevers, chills, night sweats,  swollen lymph nodes, , joint swelling, chest pain, shortness of breath, mood changes. POSITIVE muscle aches, body aches, weight loss  Objective  Blood pressure 118/72, pulse 96, height 5\' 10"  (1.778 m), weight 148 lb (67.1 kg), SpO2 98%.   General: No apparent distress alert and oriented x3 mood and affect normal, dressed appropriately.  HEENT: Pupils equal, extraocular movements intact  Respiratory: Patient's speak in full sentences and does not appear short of breath  Cardiovascular: No lower extremity edema, non tender, no erythema  Low back exam does have some loss lordosis.  Positive straight leg test on the left side.  Worse than the previous exam.  Seems to have some atrophy in lower extremities.  More pain to palpation of the lower back and seems to be out of a proportion to the amount of palpation.    Impression and Recommendations:    The above documentation has been reviewed and is accurate and complete Judi Saa, DO

## 2023-03-17 ENCOUNTER — Encounter: Payer: Self-pay | Admitting: Cardiovascular Disease

## 2023-03-18 ENCOUNTER — Telehealth: Payer: Self-pay | Admitting: Cardiovascular Disease

## 2023-03-18 NOTE — Telephone Encounter (Signed)
Duplicate encounter. Please see mychart message.

## 2023-03-18 NOTE — Telephone Encounter (Signed)
Pt c/o medication issue:  1. Name of Medication:   atorvastatin (LIPITOR) 40 MG tablet   2. How are you currently taking this medication (dosage and times per day)?   As prescribed  3. Are you having a reaction (difficulty breathing--STAT)?   4. What is your medication issue?   Patient stated he thinks this medication is causing him painful urination as it is one of the side effects. Patient noted he as already seen the urologist and PCP regarding this symptom.

## 2023-03-19 ENCOUNTER — Encounter: Payer: Self-pay | Admitting: Family Medicine

## 2023-03-19 ENCOUNTER — Ambulatory Visit: Payer: PPO | Admitting: Family Medicine

## 2023-03-19 ENCOUNTER — Encounter: Payer: PPO | Admitting: Physical Therapy

## 2023-03-19 VITALS — BP 118/72 | HR 96 | Ht 70.0 in | Wt 148.0 lb

## 2023-03-19 DIAGNOSIS — G8929 Other chronic pain: Secondary | ICD-10-CM | POA: Diagnosis not present

## 2023-03-19 DIAGNOSIS — M545 Low back pain, unspecified: Secondary | ICD-10-CM

## 2023-03-19 DIAGNOSIS — C61 Malignant neoplasm of prostate: Secondary | ICD-10-CM

## 2023-03-19 DIAGNOSIS — R102 Pelvic and perineal pain: Secondary | ICD-10-CM

## 2023-03-19 DIAGNOSIS — E538 Deficiency of other specified B group vitamins: Secondary | ICD-10-CM

## 2023-03-19 DIAGNOSIS — M5416 Radiculopathy, lumbar region: Secondary | ICD-10-CM

## 2023-03-19 MED ORDER — CYANOCOBALAMIN 1000 MCG/ML IJ SOLN
1000.0000 ug | Freq: Once | INTRAMUSCULAR | Status: AC
  Administered 2023-03-19: 1000 ug via INTRAMUSCULAR

## 2023-03-19 NOTE — Assessment & Plan Note (Signed)
Concerned that patient's PSA did increase.  Patient is having more low back pain and pelvic pain.  Will get MRI with contrast of the pelvis and without contrast of the back to see if there is any radicular symptoms that could be potentially contributing.  Discussed with patient that icing regimen and home exercises, discussed which activities to do and which ones to avoid.  Increase activity slowly otherwise.  Follow-up with me again in 6 to 8 weeks.

## 2023-03-19 NOTE — Patient Instructions (Addendum)
Call about increasing PSA Dr. Algie [facility] [phone] Call Today  When we receive your results we will contact you.  I'm guessing Dr. Annabell Howells will order pet scan B12 injection today See you again in 8 weeks

## 2023-03-20 ENCOUNTER — Other Ambulatory Visit: Payer: Self-pay | Admitting: Family Medicine

## 2023-03-20 DIAGNOSIS — C61 Malignant neoplasm of prostate: Secondary | ICD-10-CM

## 2023-03-23 ENCOUNTER — Inpatient Hospital Stay
Admission: RE | Admit: 2023-03-23 | Discharge: 2023-03-23 | Discharge status: Home / Self Care | Payer: PPO | Source: Ambulatory Visit | Attending: Family Medicine | Admitting: Family Medicine

## 2023-03-23 ENCOUNTER — Ambulatory Visit
Admission: RE | Admit: 2023-03-23 | Discharge: 2023-03-23 | Disposition: A | Discharge status: Home / Self Care | Payer: PPO | Source: Ambulatory Visit | Attending: Family Medicine | Admitting: Family Medicine

## 2023-03-23 DIAGNOSIS — G8929 Other chronic pain: Secondary | ICD-10-CM

## 2023-03-23 DIAGNOSIS — M545 Low back pain, unspecified: Secondary | ICD-10-CM | POA: Diagnosis not present

## 2023-03-23 DIAGNOSIS — M1611 Unilateral primary osteoarthritis, right hip: Secondary | ICD-10-CM | POA: Diagnosis not present

## 2023-03-23 MED ORDER — GADOPICLENOL 0.5 MMOL/ML IV SOLN
7.0000 mL | Freq: Once | INTRAVENOUS | Status: AC | PRN
  Administered 2023-03-23: 7 mL via INTRAVENOUS

## 2023-03-26 ENCOUNTER — Encounter: Payer: Self-pay | Admitting: Family Medicine

## 2023-04-01 ENCOUNTER — Encounter: Payer: Self-pay | Admitting: Genetic Counselor

## 2023-04-02 ENCOUNTER — Ambulatory Visit: Payer: PPO

## 2023-04-02 DIAGNOSIS — E785 Hyperlipidemia, unspecified: Secondary | ICD-10-CM

## 2023-04-03 ENCOUNTER — Encounter: Payer: Self-pay | Admitting: Family Medicine

## 2023-04-03 ENCOUNTER — Other Ambulatory Visit: Payer: Self-pay

## 2023-04-03 DIAGNOSIS — M5416 Radiculopathy, lumbar region: Secondary | ICD-10-CM

## 2023-04-04 ENCOUNTER — Other Ambulatory Visit (INDEPENDENT_AMBULATORY_CARE_PROVIDER_SITE_OTHER): Payer: PPO

## 2023-04-04 ENCOUNTER — Other Ambulatory Visit: Payer: Self-pay | Admitting: Cardiovascular Disease

## 2023-04-04 DIAGNOSIS — C61 Malignant neoplasm of prostate: Secondary | ICD-10-CM | POA: Diagnosis not present

## 2023-04-04 LAB — PSA: PSA: 1.73 ng/mL (ref 0.10–4.00)

## 2023-04-05 ENCOUNTER — Telehealth: Payer: Self-pay | Admitting: Cardiovascular Disease

## 2023-04-05 MED ORDER — DILTIAZEM HCL ER COATED BEADS 120 MG PO CP24: 120.0000 mg | ORAL_CAPSULE | Freq: Every day | ORAL | 0 refills | Status: DC

## 2023-04-05 NOTE — Progress Notes (Unsigned)
NEUROLOGY FOLLOW UP OFFICE NOTE  William [name] [id number]  Assessment/Plan:   1.  Migraine without aura, without status migrainosus, not intractable - increased frequency. 2.  Left lumbosacral radiculopathy/radiculitis 3.  Essential tremor - very mild   1.  Migraine prevention:  Emgality.  If he continues to have trouble getting the medication, consider changing to Botox 2.  Migraine rescue:  Nurtec and Zofran  3.  Limit use of pain relievers to no more than 2 days out of week to prevent risk of rebound or medication-overuse headache. 4.  Keep headache diary 5.  Monitor tremor  6.  Follow up one year or sooner if needed.   Subjective:  William [name] is a 62 year old right-handed male with hyperlipidemia, MVP, IBS, gastroparesis, generalized anxiety disorder and palpitations who follows up for migraines and tremor.   UPDATE: Migraines: Intensity:  Moderate to severe Duration:  Less than 15 minutes with Nurtec Frequency: 1 to 2 a month  Frequency of abortive medication: 0 days in past 30 days   Tremor: Stable.  Manageable.  Left lower extremity pain: History of left leg pain off and on.  Injections in the back previously have been helpful.  Recurrence 4-5 weeks ago.  Burning/pins and needles on bottom of left feet and radiates up the back and side of the leg to the left lower back.  However, now he has pain on urination off and on since the seed implant last year.  Always tests negative for UTI.  Thought to be neuralgia.  MRI of lumbar spine on 03/23/2023 revealed disc degeneration with shallow disc protrusion and moderate bilateral foraminal encroachment at L4-5 and L5-S1 possibly effecting either L5 nerve root.  Scheduled for epidural injection later this month.  Urology has prescribed amitriptyline for the urinary pain.    Current NSAIDS:  none Current analgesics:  None Current triptans:  Contraindicated (MVP) Current ergotamine:  none Current anti-emetic:  Zofran  4mg  Current muscle relaxants:  baclofen 10mg  TID Current anti-anxiolytic:  none Current sleep aide:  none Current Antihypertensive medications:  Cardizem Current Antidepressant medications:  amitriptyline 25mg  at bedtime Current Anticonvulsant medications:  none Current anti-CGRP: Emgality; Nurtec Current Vitamins/Herbal/Supplements:  D Current Antihistamines/Decongestants:  Claritin, Flonase Other therapy:  none   Caffeine:  No coffee, tea or caffeinated soda Diet:  Needs to improve.  Sometimes ginger ale Exercise:  No Depression:  no; Anxiety:  Yes.  stress related to prostate cancer diagnosis Other pain:  no Sleep hygiene:  Poor.  Stress related to caregiver for his mother   HISTORY: Migraines:  Onset: 1986.   Location: 90% of time: Left retro-orbital region, 10% bi-frontal Quality:  stabbing Initial intensity: Fluctuates from 4/10-10/10 Aura:  no Prodrome:  no Associated symptoms: Lightheadedness, fatigue, nausea, tearing of left eye. Initial duration: 4 hours Initial frequency: 4 to 5 times a week Triggers/aggravating factors: Afrin, odors (perfumes, cigarette, chinese food), MSG, salt, stress, extreme temperatures Relieving factors: Water Activity:  About 4 to 5 days a week, cannot function  Tremor: He also reports longstanding history of tremor.  It involves the left hand.  It occurs with use, not at rest.  Over the past 10 years, it has gotten a little worse.  He does report increased emotional stress and poor sleep.  No problems with walking.  No family history of tremor.    Past NSAIDS: Indomethacin Past analgesics: Nucynta, lidocaine patches, tramadol, nalbuphine, Tylox, bupivacaine injections, hydrocodone, morphine, Tylenol #2, Tylenol Past abortive triptans:  Sumatriptan  po/[place]amar, Maxalt, Relpax, Amerge, Zomig Past abortive ergotamine:  Migranal, DHE protocol Past muscle relaxants:  Robaxin, Flexeril Past anti-emetic:  Reglan, Phenergan Past anxiolytics:   Temazeam, clonazepam Past antihypertensive medications:  Inderal, metoprolol, verapamil Past antidepressant medications:  Nortriptyline, amitriptyline, venlafaxine, Wellbutrin, Remeron Past anticonvulsant medications:  Depakote, topiramate (weight loss, diarrhea), Lyrica,Keppra, gabapentin Past anti-CGRP:  none Past vitamins/Herbal/Supplements:  petasites Past antihistamines/decongestants:  none Other past therapies:  Biofeedback, acupuncture, counseling     He has had multiple imaging of the head, all unremarkable, including:    CT HEAD from 06/01/09 and 07/30/09. MRI BRAIN W/WO from 12/08/02, 01/19/05, 04/18/12.  MRI of brain ordered by me from 11/19/2015 was personally reviewed and was normal.   CERVICAL XR from 01/15/08 showed mild degenerative disc disease at C5-6.   He has seen multiple neurologists and headache and pain specialists, including Dr. Avie Echevaria, Dr. Porfirio Mylar Dohmeieir, Dr. Meryl Crutch, Dr. Vela Prose, Dr. Anner Crete at [facility] and he was seen at the [facility] clinic.     He also has history of some obsessive compulsive symptoms since he was young.  For example, he repeatedly washes his hands and always uses a new cup during the day when he drinks something.  He has history of low blood pressure and has been evaluated by cardiology for palpitations.  Work up was unremarkable.  PAST MEDICAL HISTORY: Past Medical History:  Diagnosis Date   Allergy    Anxiety    Chronic kidney disease    Depression    Diverticulitis    Gastroparesis    Heart murmur    Migraine headache    Mitral valve prolapse    SVT (supraventricular tachycardia) (HCC)     MEDICATIONS: Current Outpatient Medications on File Prior to Visit  Medication Sig Dispense Refill   aspirin EC 81 MG tablet Take 1 tablet (81 mg total) by mouth daily. Swallow whole. 30 tablet 12   atorvastatin (LIPITOR) 40 MG tablet Take 1 tablet (40 mg total) by mouth daily. 90 tablet 3   baclofen (LIORESAL) 10 MG tablet Take 0.5 tablets (5  mg total) by mouth at bedtime as needed for muscle spasms. 30 each 0   clobetasol (TEMOVATE) 0.05 % external solution Apply 1 Application topically 2 (two) times daily. Apply to scalp x 2 weeks prn itch 50 mL 2   clobetasol cream (TEMOVATE) 0.05 % Apply 1 Application topically 2 (two) times daily. To hands and feet x 2 weeks prn 60 g 2   diltiazem (CARDIZEM CD) 120 MG 24 hr capsule TAKE ONE CAPSULE BY MOUTH DAILY 90 capsule 2   diltiazem (CARDIZEM) 30 MG tablet TAKE (1) TABLET AS NEEDED FOR PALPITATIONS 30 tablet 0   EMGALITY 120 MG/ML SOAJ INJECT 120MG  (1 PEN) UNDER THE SKIN ONCE A MONTH 3 mL 0   fluticasone (FLONASE) 50 MCG/ACT nasal spray Place 2 sprays into both nostrils daily. 16 g 6   hydrOXYzine (ATARAX) 50 MG tablet Take 25-50 mg by mouth 2 (two) times daily as needed for itching.     metoCLOPramide (REGLAN) 5 MG tablet Take 5 mg by mouth as needed.     mirtazapine (REMERON) 15 MG tablet Take 1 tablet (15 mg total) by mouth at bedtime. 30 tablet 0   ondansetron (ZOFRAN-ODT) 8 MG disintegrating tablet Take 8 mg by mouth 3 (three) times daily as needed.     polyethylene glycol powder (GLYCOLAX/MIRALAX) 17 GM/SCOOP powder Take 17 g by mouth as needed for moderate constipation.     Rimegepant  Sulfate (NURTEC) 75 MG TBDP Take 75 mg by mouth as needed for up to 1 dose (Maximum 1 tablet in 24 hours.). 8 tablet 5   traZODone (DESYREL) 50 MG tablet Take 1 tablet (50 mg total) by mouth at bedtime as needed for sleep. 30 tablet 3   No current facility-administered medications on file prior to visit.    ALLERGIES: Allergies  Allergen Reactions   Erythromycin Anaphylaxis and Nausea And Vomiting   Nalbuphine Other (See Comments)    States loss of consciousness    Cefdinir Diarrhea, Nausea And Vomiting and Rash   Cephalexin Diarrhea and Nausea And Vomiting   Cephalosporins Diarrhea and Nausea And Vomiting   Hydrocodone-Acetaminophen Nausea And Vomiting   Ketorolac Nausea And Vomiting    Other  reaction(s): upset stomach   Prochlorperazine Edisylate Other (See Comments)    uncontrollable shake - tremor    Restasis [Cyclosporine] Other (See Comments)    migraines   Tyloxapol Swelling   Bacitracin-Polymyxin B     Other reaction(s): rash gets worse Other reaction(s): rash gets worse   Codeine Other (See Comments)    Other reaction(s): stomach upset   Nalbuphine Hcl     Other reaction(s): stomach upset   Prochlorperazine     Other reaction(s): tremors/vomiting   Propranolol Diarrhea   Rosuvastatin Diarrhea   Topamax [Topiramate] Diarrhea and Nausea And Vomiting   Zetia [Ezetimibe] Diarrhea   Cefdinir Diarrhea, Nausea And Vomiting and Rash    Other reaction(s): too stronge   Doxycycline Hyclate Nausea Only and Other (See Comments)    abd pain   Iodinated Contrast Media Other (See Comments)    Lightheaded, flushed feeling (explained to patient these are normal side effects of IV iodinated contrast, not allergies, but he wanted this noted in the epic; he tolerated epidural steroid injections w/ contrast w/o difficulty)   Ketoconazole Itching   Ketorolac Tromethamine Rash    REACTION: Reaction not known   Latex Itching and Rash   Neosporin [Neomycin-Bacitracin Zn-Polymyx] Rash   Oxycodone-Acetaminophen Other (See Comments)    Tylox caused constipation   Prednisone Nausea And Vomiting    Oral route, only Other reaction(s): stomach spasms    FAMILY HISTORY: Family History  Problem Relation Age of Onset   Hypertension Mother    Lung cancer Father 16 - 67   Hypertension Brother    Kidney failure Maternal Grandmother    Prostate cancer Neg Hx    Kidney cancer Neg Hx       Objective:  Blood pressure 110/65, pulse 83, height 5\' 10"  (1.778 m), weight 145 lb 9.6 oz (66 kg), SpO2 100%. General: No acute distress.  Patient appears well-groomed.   Head:  Normocephalic/atraumatic Eyes:  Fundi examined but not visualized Neck: supple, no paraspinal tenderness, full range of  motion Heart:  Regular rate and rhythm Neurological Exam: alert and oriented.  Speech fluent and not dysarthric, language intact.  CN II-XII intact. Bulk and tone normal, muscle strength 5/5 throughout.  Sensation to light touch intact.  Deep tendon reflexes 2+ throughout.  Finger to nose testing intact.  Gait normal, Romberg negative.    Shon Millet, DO  CC: Farris Has, MD

## 2023-04-05 NOTE — Telephone Encounter (Signed)
 *  STAT* If patient is at the pharmacy, call can be transferred to refill team.   1. Which medications need to be refilled? (please list name of each medication and dose if known) diltiazem (CARDIZEM CD) 120 MG 24 hr capsule    2. Would you like to learn more about the convenience, safety, & potential cost savings by using the [facility]?    3. Are you open to using the [facility] (Type [facility].   4. Which pharmacy/location (including street and city if local pharmacy) is medication to be sent to?  [facility] [place], [place] - [address]     5. Do they need a 30 day or 90 day supply? 90 days

## 2023-04-08 ENCOUNTER — Encounter: Payer: Self-pay | Admitting: Neurology

## 2023-04-08 ENCOUNTER — Ambulatory Visit: Payer: PPO | Admitting: Neurology

## 2023-04-08 VITALS — BP 110/65 | HR 83 | Ht 70.0 in | Wt 145.6 lb

## 2023-04-08 DIAGNOSIS — G250 Essential tremor: Secondary | ICD-10-CM

## 2023-04-08 DIAGNOSIS — M5416 Radiculopathy, lumbar region: Secondary | ICD-10-CM

## 2023-04-08 DIAGNOSIS — G43009 Migraine without aura, not intractable, without status migrainosus: Secondary | ICD-10-CM | POA: Diagnosis not present

## 2023-04-08 MED ORDER — ONDANSETRON 8 MG PO TBDP: 8.0000 mg | ORAL_TABLET | Freq: Three times a day (TID) | ORAL | 5 refills | Status: DC | PRN

## 2023-04-08 MED ORDER — NURTEC 75 MG PO TBDP: 75.0000 mg | ORAL_TABLET | ORAL | 11 refills | Status: DC | PRN

## 2023-04-08 NOTE — Patient Instructions (Signed)
Continue Emgality.  If you can no longer get it, contact me. Nurtec once daily as needed.  Ondansetron for nausea Follow up one year or sooner if needed

## 2023-04-09 NOTE — Progress Notes (Unsigned)
[name] [address] [place] [address] Phone: [phone] Subjective:   William [name], am serving as a scribe for Dr. Antoine Primas.  I'm seeing this patient by the request  of:  Farris Has, MD  CC: Low back pain  [id number]  03/19/2023 Concerned that patient's PSA did increase.  Patient is having more low back pain and pelvic pain.  Will get MRI with contrast of the pelvis and without contrast of the back to see if there is any radicular symptoms that could be potentially contributing.  Discussed with patient that icing regimen and home exercises, discussed which activities to do and which ones to avoid.  Increase activity slowly otherwise.  Follow-up with me again in 6 to 8 weeks.     Update 04/10/2023 William [name] is a 62 y.o. male coming in with complaint of lumbar spine pain. Patient states has written down everything.  Patient continues to have back pain.  Still concerned that some of it could be secondary to his prostate still.  Likely last PSA did go down.  Patient is wondering why that is.  Patient is scheduled to have an injection in the soon now.      Past Medical History:  Diagnosis Date   Allergy    Anxiety    Chronic kidney disease    Depression    Diverticulitis    Gastroparesis    Heart murmur    Migraine headache    Mitral valve prolapse    SVT (supraventricular tachycardia) (HCC)    Past Surgical History:  Procedure Laterality Date   COLONOSCOPY W/ BIOPSIES  2023   RADIOACTIVE SEED IMPLANT N/A 01/19/2022   Procedure: RADIOACTIVE SEED IMPLANT/BRACHYTHERAPY IMPLANT;  Surgeon: Bjorn Pippin, MD;  Location: [facility];  Service: Urology;  Laterality: N/A;   SPACE OAR INSTILLATION N/A 01/19/2022   Procedure: SPACE OAR INSTILLATION;  Surgeon: Bjorn Pippin, MD;  Location: [facility];  Service: Urology;  Laterality: N/A;   Social History   Socioeconomic History   Marital  status: Married    Spouse name: Not on file   Number of children: 0   Years of education: Not on file   Highest education level: Bachelor's degree (e.g., BA, AB, BS)  Occupational History   Not on file  Tobacco Use   Smoking status: Never   Smokeless tobacco: Never  Vaping Use   Vaping status: Never Used  Substance and Sexual Activity   Alcohol use: No    Alcohol/week: 0.0 standard drinks of alcohol   Drug use: No   Sexual activity: Yes    Partners: Female  Other Topics Concern   Not on file  Social History Narrative   Pt is married lives with spouse in 1 story apartment he has No children   BS degree- he is right handed   Drinks soda  Ginger ale, no coffee no tea   Right handed   Social Determinants of Health   Financial Resource Strain: Low Risk  (10/25/2021)   Overall Financial Resource Strain (CARDIA)    Difficulty of Paying Living Expenses: Not hard at all  Food Insecurity: No Food Insecurity (10/25/2021)   Hunger Vital Sign    Worried About Running Out of Food in the Last Year: Never true    Ran Out of Food in the Last Year: Never true  Transportation Needs: No Transportation Needs (10/25/2021)   PRAPARE - Transportation    Lack of Transportation (  Medical): No    Lack of Transportation (Non-Medical): No  Physical Activity: Not on file  Stress: Stress Concern Present (10/25/2021)   [employer] of Occupational Health - Occupational Stress Questionnaire    Feeling of Stress : Rather much  Social Connections: Not on file   Allergies  Allergen Reactions   Erythromycin Anaphylaxis and Nausea And Vomiting   Nalbuphine Other (See Comments)    States loss of consciousness    Cefdinir Diarrhea, Nausea And Vomiting and Rash   Cephalexin Diarrhea and Nausea And Vomiting   Cephalosporins Diarrhea and Nausea And Vomiting   Hydrocodone-Acetaminophen Nausea And Vomiting   Ketorolac Nausea And Vomiting    Other reaction(s): upset stomach   Prochlorperazine Edisylate  Other (See Comments)    uncontrollable shake - tremor    Restasis [Cyclosporine] Other (See Comments)    migraines   Tyloxapol Swelling   Bacitracin-Polymyxin B     Other reaction(s): rash gets worse Other reaction(s): rash gets worse   Codeine Other (See Comments)    Other reaction(s): stomach upset   Nalbuphine Hcl     Other reaction(s): stomach upset   Prochlorperazine     Other reaction(s): tremors/vomiting   Propranolol Diarrhea   Rosuvastatin Diarrhea   Topamax [Topiramate] Diarrhea and Nausea And Vomiting   Zetia [Ezetimibe] Diarrhea   Cefdinir Diarrhea, Nausea And Vomiting and Rash    Other reaction(s): too stronge   Doxycycline Hyclate Nausea Only and Other (See Comments)    abd pain   Iodinated Contrast Media Other (See Comments)    Lightheaded, flushed feeling (explained to patient these are normal side effects of IV iodinated contrast, not allergies, but he wanted this noted in the epic; he tolerated epidural steroid injections w/ contrast w/o difficulty)   Ketoconazole Itching   Ketorolac Tromethamine Rash    REACTION: Reaction not known   Latex Itching and Rash   Neosporin [Neomycin-Bacitracin Zn-Polymyx] Rash   Oxycodone-Acetaminophen Other (See Comments)    Tylox caused constipation   Prednisone Nausea And Vomiting    Oral route, only Other reaction(s): stomach spasms   Family History  Problem Relation Age of Onset   Hypertension Mother    Lung cancer Father 62 - 40   Hypertension Brother    Kidney failure Maternal Grandmother    Prostate cancer Neg Hx    Kidney cancer Neg Hx      Current Outpatient Medications (Cardiovascular):    diltiazem (CARDIZEM CD) 120 MG 24 hr capsule, Take 1 capsule (120 mg total) by mouth daily.   diltiazem (CARDIZEM) 30 MG tablet, TAKE (1) TABLET AS NEEDED FOR PALPITATIONS  Current Outpatient Medications (Respiratory):    fluticasone (FLONASE) 50 MCG/ACT nasal spray, Place 2 sprays into both nostrils daily.  Current  Outpatient Medications (Analgesics):    aspirin EC 81 MG tablet, Take 1 tablet (81 mg total) by mouth daily. Swallow whole. (Patient not taking: Reported on 04/08/2023)   EMGALITY 120 MG/ML SOAJ, INJECT 120MG  (1 PEN) UNDER THE SKIN ONCE A MONTH   Rimegepant Sulfate (NURTEC) 75 MG TBDP, Take 1 tablet (75 mg total) by mouth as needed for up to 1 dose (Maximum 1 tablet in 24 hours.).   Current Outpatient Medications (Other):    amitriptyline (ELAVIL) 25 MG tablet, Take 25 mg by mouth at bedtime.   baclofen (LIORESAL) 10 MG tablet, Take 0.5 tablets (5 mg total) by mouth at bedtime as needed for muscle spasms.   clobetasol (TEMOVATE) 0.05 % external solution, Apply 1 Application  topically 2 (two) times daily. Apply to scalp x 2 weeks prn itch   clobetasol cream (TEMOVATE) 0.05 %, Apply 1 Application topically 2 (two) times daily. To hands and feet x 2 weeks prn   hydrOXYzine (ATARAX) 50 MG tablet, Take 25-50 mg by mouth 2 (two) times daily as needed for itching.   metoCLOPramide (REGLAN) 5 MG tablet, Take 5 mg by mouth as needed.   ondansetron (ZOFRAN-ODT) 8 MG disintegrating tablet, Take 1 tablet (8 mg total) by mouth 3 (three) times daily as needed.   polyethylene glycol powder (GLYCOLAX/MIRALAX) 17 GM/SCOOP powder, Take 17 g by mouth as needed for moderate constipation.   silodosin (RAPAFLO) 8 MG CAPS capsule, Take 8 mg by mouth daily with breakfast.   traZODone (DESYREL) 50 MG tablet, Take 1 tablet (50 mg total) by mouth at bedtime as needed for sleep.   Reviewed prior external information including notes and imaging from  primary care provider As well as notes that were available from care everywhere and other healthcare systems.  Past medical history, social, surgical and family history all reviewed in electronic medical record.  No pertanent information unless stated regarding to the chief complaint.   Review of Systems:  No headache, visual changes, nausea, vomiting, diarrhea,  constipation, dizziness, abdominal pain, skin rash, fevers, chills, night sweats, weight loss, swollen lymph nodes, body aches, joint swelling, chest pain, shortness of breath, mood changes. POSITIVE muscle aches  Objective  Blood pressure 108/68, pulse 79, height 5\' 10"  (1.778 m), weight 149 lb (67.6 kg), SpO2 98%.   General: No apparent distress alert and oriented x3 mood and affect normal, dressed appropriately.  HEENT: Pupils equal, extraocular movements intact  Respiratory: Patient's speak in full sentences and does not appear short of breath  Cardiovascular: No lower extremity edema, non tender, no erythema   Patient is comfortable sitting.  Does seem to readjust from time to time secondary to stiffness in the lower back.   Impression and Recommendations:     The above documentation has been reviewed and is accurate and complete Judi Saa, DO

## 2023-04-10 ENCOUNTER — Ambulatory Visit: Payer: PPO | Admitting: Family Medicine

## 2023-04-10 ENCOUNTER — Encounter: Payer: Self-pay | Admitting: Family Medicine

## 2023-04-10 VITALS — BP 108/68 | HR 79 | Ht 70.0 in | Wt 149.0 lb

## 2023-04-10 DIAGNOSIS — C61 Malignant neoplasm of prostate: Secondary | ICD-10-CM

## 2023-04-10 DIAGNOSIS — M5416 Radiculopathy, lumbar region: Secondary | ICD-10-CM | POA: Diagnosis not present

## 2023-04-10 DIAGNOSIS — F32A Depression, unspecified: Secondary | ICD-10-CM | POA: Diagnosis not present

## 2023-04-10 DIAGNOSIS — E538 Deficiency of other specified B group vitamins: Secondary | ICD-10-CM

## 2023-04-10 MED ORDER — CYANOCOBALAMIN 1000 MCG/ML IJ SOLN
1000.0000 ug | Freq: Once | INTRAMUSCULAR | Status: AC
  Administered 2023-04-10: 1000 ug via INTRAMUSCULAR

## 2023-04-10 NOTE — Assessment & Plan Note (Signed)
Patient continues to have more of the low back pain.  Has not gone through with the epidural yet.  We discussed that I do think that that could be beneficial.  Patient is still preoccupied with the cancer but did have a PSA that went down and that is making patient optimistic.  B12 can be playing a role as well and given a B12 injection today.  With reviewing patient's outside records, outside test results as well as discussing with patient total time today 32 minutes

## 2023-04-10 NOTE — Assessment & Plan Note (Signed)
History of depression.  Patient has no homicidal or suicidal ideation.  Has had difficulty at home with this previously.  Patient was looking to find another provider on his own but never did so.  Likely we do have 1 in the office now and will refer him accordingly.

## 2023-04-10 NOTE — Assessment & Plan Note (Signed)
Injection given today. 

## 2023-04-10 NOTE — Patient Instructions (Addendum)
Good to see you! PSA for January 3600mg  [place] at night See me after neurologist in January

## 2023-04-11 ENCOUNTER — Telehealth: Payer: Self-pay

## 2023-04-11 ENCOUNTER — Telehealth: Payer: Self-pay | Admitting: Family Medicine

## 2023-04-11 ENCOUNTER — Other Ambulatory Visit: Payer: Self-pay | Admitting: Neurology

## 2023-04-11 MED ORDER — ONDANSETRON HCL 8 MG PO TABS: 8.0000 mg | ORAL_TABLET | Freq: Three times a day (TID) | ORAL | 5 refills | Status: DC | PRN

## 2023-04-11 NOTE — Telephone Encounter (Signed)
Per fax from Pharmacy Zofran dispersible tablet not covered.

## 2023-04-11 NOTE — Telephone Encounter (Signed)
PA needed for Nurtec 75 mg.  

## 2023-04-11 NOTE — Telephone Encounter (Signed)
Patient called stating that he is having a lot of pain that is causing him to be nauseous. He asked if there was anything that Dr Katrinka Blazing could call in for him?

## 2023-04-11 NOTE — Telephone Encounter (Signed)
Per DR.Jaffe, I sent in the non-disintegrating form

## 2023-04-11 NOTE — Telephone Encounter (Signed)
error 

## 2023-04-12 ENCOUNTER — Other Ambulatory Visit: Payer: Self-pay | Admitting: *Deleted

## 2023-04-12 DIAGNOSIS — E785 Hyperlipidemia, unspecified: Secondary | ICD-10-CM

## 2023-04-12 MED ORDER — ONDANSETRON HCL 8 MG PO TABS: 8.0000 mg | ORAL_TABLET | Freq: Three times a day (TID) | ORAL | 5 refills | Status: DC | PRN

## 2023-04-12 NOTE — Progress Notes (Signed)
Re entered the orders for lipids and liver.  Released.

## 2023-04-15 DIAGNOSIS — E785 Hyperlipidemia, unspecified: Secondary | ICD-10-CM | POA: Diagnosis not present

## 2023-04-15 NOTE — Discharge Instructions (Signed)

## 2023-04-16 ENCOUNTER — Inpatient Hospital Stay
Admission: RE | Admit: 2023-04-16 | Discharge: 2023-04-16 | Disposition: A | Discharge status: Home / Self Care | Payer: PPO | Source: Ambulatory Visit | Attending: Family Medicine | Admitting: Family Medicine

## 2023-04-16 DIAGNOSIS — M4727 Other spondylosis with radiculopathy, lumbosacral region: Secondary | ICD-10-CM | POA: Diagnosis not present

## 2023-04-16 DIAGNOSIS — M5416 Radiculopathy, lumbar region: Secondary | ICD-10-CM

## 2023-04-16 LAB — LIPID PANEL
Chol/HDL Ratio: 3.5 {ratio} (ref 0.0–5.0)
Cholesterol, Total: 210 mg/dL — ABNORMAL HIGH (ref 100–199)
HDL: 60 mg/dL (ref >39)
LDL Chol Calc (NIH): 133 mg/dL — ABNORMAL HIGH (ref 0–99)
Triglycerides: 95 mg/dL (ref 0–149)
VLDL Cholesterol Cal: 17 mg/dL (ref 5–40)

## 2023-04-16 LAB — HEPATIC FUNCTION PANEL
ALT: 13 [IU]/L (ref 0–44)
AST: 18 [IU]/L (ref 0–40)
Albumin: 3.9 g/dL (ref 3.9–4.9)
Alkaline Phosphatase: 110 [IU]/L (ref 44–121)
Bilirubin Total: 0.8 mg/dL (ref 0.0–1.2)
Bilirubin, Direct: 0.21 mg/dL (ref 0.00–0.40)
Total Protein: 6.7 g/dL (ref 6.0–8.5)

## 2023-04-16 MED ORDER — IOPAMIDOL (ISOVUE-M 200) INJECTION 41%
1.0000 mL | Freq: Once | INTRAMUSCULAR | Status: AC
  Administered 2023-04-16: 1 mL via EPIDURAL

## 2023-04-16 MED ORDER — METHYLPREDNISOLONE ACETATE 40 MG/ML INJ SUSP (RADIOLOG
80.0000 mg | Freq: Once | INTRAMUSCULAR | Status: AC
  Administered 2023-04-16: 80 mg via EPIDURAL

## 2023-04-17 ENCOUNTER — Telehealth: Payer: Self-pay | Admitting: Pharmacy Technician

## 2023-04-17 ENCOUNTER — Ambulatory Visit: Payer: PPO | Admitting: Dermatology

## 2023-04-17 NOTE — Telephone Encounter (Signed)
Pharmacy Patient Advocate Encounter   Received notification from Pt Calls Messages that prior authorization for ONDANSETRON 8MG  is required/requested.   Insurance verification completed.   The patient is insured through [facility] ADVANTAGE/RX ADVANCE .   Per test claim: PA required; PA submitted to above mentioned insurance via Fax Key/confirmation #/EOC [phone] Status is pending

## 2023-04-17 NOTE — Telephone Encounter (Signed)
Pharmacy Patient Advocate Encounter   Received notification from Pt Calls Messages that prior authorization for NURTEC 75MG  is required/requested.   Insurance verification completed.   The patient is insured through [facility] ADVANTAGE/RX ADVANCE .   Per test claim: PA required; PA submitted to above mentioned insurance via CoverMyMeds Key/confirmation #/EOC [id number] Status is pending

## 2023-04-17 NOTE — Telephone Encounter (Signed)
PA has been submitted, and telephone encounter has been created. 

## 2023-04-17 NOTE — Telephone Encounter (Incomplete)
Pharmacy Patient Advocate Encounter   Received notification from Pt Calls Messages that prior authorization for ONDANSETRON 8MG  is required/requested.   Insurance verification completed.   The patient is insured through [facility] ADVANTAGE/RX ADVANCE .   Per test claim: PA required; PA submitted to above mentioned insurance via CoverMyMeds Key/confirmation #/EOC [id number] Status is pending

## 2023-04-18 ENCOUNTER — Other Ambulatory Visit (HOSPITAL_COMMUNITY): Payer: Self-pay

## 2023-04-18 ENCOUNTER — Ambulatory Visit: Payer: Self-pay | Admitting: Licensed Clinical Social Worker

## 2023-04-18 NOTE — Progress Notes (Signed)
Chief Complaint  Patient presents with   Follow-up    CAD, SVT   History of Present Illness: 62 yo male with history of CAD (by calcium score), palpitations, PVCs, PACs, SVT, migraine headaches, mitral valve prolapse and irritable bowel syndrome who is here today for cardiac follow up. He is known to have PVCs and SVT. He has not tolerated beta blockers. He had a normal echo and stress test in 2011.  Echo in 2014 with normal LV size and function. 48 hour monitor in 2014 with normal sinus rhythm and rare PACs. He was seen November 2018 in our office by Herma Carson, PA and had c/o palpitations. Cardiac event monitor November 2018 with sinus rhythm and one run of SVT (40 beats).  He was started on propranolol for his SVT by primary care but it caused diarrhea. I restarted his Cardizem CD when I saw him in February 2019. He was seen in our office October 2021 and reported more episodes of SVT. Cardiac monitor November 2021 with several short runs of SVT. He was seen in the EP clinic in November 2021 by Dr. Raul Del and he elected to continue Cardizem rather than move toward an ablation. I saw him in march 2023 and he had stopped taking Cardizem CD 180 mg daily due to some dizziness. He had been taking the short acting Diltiazem as needed and reported palpitations several days per week. Echo March 2023 with LVEF=70-75%. Trivial MR. Cardiac monitor April 2023 with 4 runs of SVT with longest at 23 minutes. He was seen by Dr. Raul Del 09/28/21 and plans were to continue medical therapy with Cardizem alone given diagnosis of prostate cancer. He was treated with radioactive seed implants. CT coronary calcium score of 144 in May 2024. We started a statin but he stopped this due to painful urination.   He is here today for follow up. The patient denies any chest pain, dyspnea, lower extremity edema, orthopnea, PND, dizziness, near syncope or syncope. He has taken his extra Cardizem 3-4 times per month over the  past year for palpitations.   Primary Care Physician: Farris Has, MD  Past Medical History:  Diagnosis Date   Allergy    Anxiety    Chronic kidney disease    Depression    Diverticulitis    Gastroparesis    Heart murmur    Migraine headache    Mitral valve prolapse    SVT (supraventricular tachycardia) (HCC)    Past Surgical History:  Procedure Laterality Date   COLONOSCOPY W/ BIOPSIES  2023   RADIOACTIVE SEED IMPLANT N/A 01/19/2022   Procedure: RADIOACTIVE SEED IMPLANT/BRACHYTHERAPY IMPLANT;  Surgeon: Bjorn Pippin, MD;  Location: [facility];  Service: Urology;  Laterality: N/A;   SPACE OAR INSTILLATION N/A 01/19/2022   Procedure: SPACE OAR INSTILLATION;  Surgeon: Bjorn Pippin, MD;  Location: [facility];  Service: Urology;  Laterality: N/A;    Current Outpatient Medications  Medication Sig Dispense Refill   amoxicillin (AMOXIL) 500 MG capsule Take 500 mg by mouth as needed (Denist visits).     azelastine (ASTELIN) 0.1 % nasal spray 1-2 puffs in each nostril Nasally Twice a day     baclofen (LIORESAL) 10 MG tablet Take 0.5 tablets (5 mg total) by mouth at bedtime as needed for muscle spasms. 30 each 0   benzonatate (TESSALON) 100 MG capsule Take 100-200 mg by mouth 3 (three) times daily as needed.     clobetasol (TEMOVATE) 0.05 % external  solution Apply 1 Application topically 2 (two) times daily. Apply to scalp x 2 weeks prn itch 50 mL 2   clobetasol cream (TEMOVATE) 0.05 % Apply 1 Application topically 2 (two) times daily. To hands and feet x 2 weeks prn 60 g 2   diltiazem (CARDIZEM CD) 120 MG 24 hr capsule Take 1 capsule (120 mg total) by mouth daily. 30 capsule 0   diltiazem (CARDIZEM) 30 MG tablet TAKE (1) TABLET AS NEEDED FOR PALPITATIONS 30 tablet 0   EMGALITY 120 MG/ML SOAJ INJECT 120MG  (1 PEN) UNDER THE SKIN ONCE A MONTH 3 mL 0   fluticasone (FLONASE) 50 MCG/ACT nasal spray Place 2 sprays into both nostrils daily. 16 g 6   hydrocortisone  (ANUSOL-HC) 2.5 % rectal cream SMARTSIG:Rectally 3 Times Daily PRN     hydrOXYzine (ATARAX) 50 MG tablet Take 25-50 mg by mouth 2 (two) times daily as needed for itching.     metoCLOPramide (REGLAN) 5 MG tablet Take 5 mg by mouth as needed.     ondansetron (ZOFRAN-ODT) 8 MG disintegrating tablet Take 8 mg by mouth every 8 (eight) hours as needed for nausea.     polyethylene glycol powder (GLYCOLAX/MIRALAX) 17 GM/SCOOP powder Take 17 g by mouth as needed for moderate constipation.     promethazine (PHENERGAN) 25 MG tablet Take 25 mg by mouth every 4 (four) hours as needed for nausea.     Rimegepant Sulfate (NURTEC) 75 MG TBDP Take 1 tablet (75 mg total) by mouth as needed for up to 1 dose (Maximum 1 tablet in 24 hours.). 8 tablet 11   rosuvastatin (CRESTOR) 10 MG tablet Take 1 tablet (10 mg total) by mouth daily. 90 tablet 3   silodosin (RAPAFLO) 8 MG CAPS capsule Take 8 mg by mouth daily with breakfast.     traZODone (DESYREL) 50 MG tablet Take 1 tablet (50 mg total) by mouth at bedtime as needed for sleep. 30 tablet 3   aspirin EC 81 MG tablet Take 1 tablet (81 mg total) by mouth daily. Swallow whole. (Patient not taking: Reported on 04/19/2023) 30 tablet 12   No current facility-administered medications for this visit.    Allergies  Allergen Reactions   Erythromycin Anaphylaxis and Nausea And Vomiting   Nalbuphine Other (See Comments)    States loss of consciousness    Cefdinir Diarrhea, Nausea And Vomiting and Rash   Cephalexin Diarrhea and Nausea And Vomiting   Cephalosporins Diarrhea and Nausea And Vomiting   Hydrocodone-Acetaminophen Nausea And Vomiting   Ketorolac Nausea And Vomiting    Other reaction(s): upset stomach   Prochlorperazine Edisylate Other (See Comments)    uncontrollable shake - tremor    Restasis [Cyclosporine] Other (See Comments)    migraines   Tyloxapol Swelling   Bacitracin-Polymyxin B     Other reaction(s): rash gets worse Other reaction(s): rash gets  worse   Codeine Other (See Comments)    Other reaction(s): stomach upset   Nalbuphine Hcl     Other reaction(s): stomach upset   Prochlorperazine     Other reaction(s): tremors/vomiting   Propranolol Diarrhea   Rosuvastatin Diarrhea   Topamax [Topiramate] Diarrhea and Nausea And Vomiting   Zetia [Ezetimibe] Diarrhea   Cefdinir Diarrhea, Nausea And Vomiting and Rash    Other reaction(s): too stronge   Doxycycline Hyclate Nausea Only and Other (See Comments)    abd pain   Iodinated Contrast Media Other (See Comments)    Lightheaded, flushed feeling (explained to patient these are  normal side effects of IV iodinated contrast, not allergies, but he wanted this noted in the epic; he tolerated epidural steroid injections w/ contrast w/o difficulty)   Ketoconazole Itching   Ketorolac Tromethamine Rash    REACTION: Reaction not known   Latex Itching and Rash   Neosporin [Neomycin-Bacitracin Zn-Polymyx] Rash   Oxycodone-Acetaminophen Other (See Comments)    Tylox caused constipation   Prednisone Nausea And Vomiting    Oral route, only Other reaction(s): stomach spasms    Social History   Socioeconomic History   Marital status: Married    Spouse name: Not on file   Number of children: 0   Years of education: Not on file   Highest education level: Bachelor's degree (e.g., BA, AB, BS)  Occupational History   Not on file  Tobacco Use   Smoking status: Never   Smokeless tobacco: Never  Vaping Use   Vaping status: Never Used  Substance and Sexual Activity   Alcohol use: No    Alcohol/week: 0.0 standard drinks of alcohol   Drug use: No   Sexual activity: Yes    Partners: Female  Other Topics Concern   Not on file  Social History Narrative   Pt is married lives with spouse in 1 story apartment he has No children   BS degree- he is right handed   Drinks soda  Ginger ale, no coffee no tea   Right handed   Social Drivers of Corporate investment banker Strain: Low Risk   (10/25/2021)   Overall Financial Resource Strain (CARDIA)    Difficulty of Paying Living Expenses: Not hard at all  Food Insecurity: No Food Insecurity (10/25/2021)   Hunger Vital Sign    Worried About Running Out of Food in the Last Year: Never true    Ran Out of Food in the Last Year: Never true  Transportation Needs: No Transportation Needs (10/25/2021)   PRAPARE - Administrator, Civil Service (Medical): No    Lack of Transportation (Non-Medical): No  Physical Activity: Not on file  Stress: Stress Concern Present (10/25/2021)   [employer] of Occupational Health - Occupational Stress Questionnaire    Feeling of Stress : Rather much  Social Connections: Not on file  Intimate Partner Violence: Not At Risk (02/05/2022)   Humiliation, Afraid, Rape, and Kick questionnaire    Fear of Current or Ex-Partner: No    Emotionally Abused: No    Physically Abused: No    Sexually Abused: No    Family History  Problem Relation Age of Onset   Hypertension Mother    Lung cancer Father 69 - 74   Hypertension Brother    Kidney failure Maternal Grandmother    Prostate cancer Neg Hx    Kidney cancer Neg Hx     Review of Systems:  As stated in the HPI and otherwise negative.   BP 122/78 (BP Location: Left Arm, Patient Position: Sitting)   Pulse 84   Ht 5\' 10"  (1.778 m)   Wt 66.4 kg   SpO2 99%   BMI 21.01 kg/m   Physical Examination: General: Well developed, well nourished, NAD  HEENT: OP clear, mucus membranes moist  SKIN: warm, dry. No rashes. Neuro: No focal deficits  Musculoskeletal: Muscle strength 5/5 all ext  Psychiatric: Mood and affect normal  Neck: No JVD, no carotid bruits, no thyromegaly, no lymphadenopathy.  Lungs:Clear bilaterally, no wheezes, rhonci, crackles Cardiovascular: Regular rate and rhythm. No murmurs, gallops or rubs. Abdomen:Soft. Bowel  sounds present. Non-tender.  Extremities: No lower extremity edema. Pulses are 2 + in the bilateral DP/PT.    Echo 07/19/21:  1. Left ventricular ejection fraction, by estimation, is 70 to 75%. Left  ventricular ejection fraction by 3D volume is 70 %. The left ventricle has  hyperdynamic function. The left ventricle has no regional wall motion  abnormalities. Left ventricular  diastolic parameters are consistent with Grade I diastolic dysfunction  (impaired relaxation). The average left ventricular global longitudinal  strain is 23.5 %. The global longitudinal strain is normal.   2. Right ventricular systolic function is normal. The right ventricular  size is normal. There is normal pulmonary artery systolic pressure. The  estimated right ventricular systolic pressure is 16.8 mmHg.   3. No diagnostic prolapse. The mitral valve leaflets are mildly  thickened. Trivial mitral valve regurgitation.   4. The aortic valve is tricuspid. Aortic valve regurgitation is not  visualized.   5. The inferior vena cava is normal in size with greater than 50%  respiratory variability, suggesting right atrial pressure of 3 mmHg.    EKG:  EKG is ordered today. The ekg ordered today demonstrates  EKG Interpretation Date/Time:  Friday April 19 2023 10:30:47 EST Ventricular Rate:  78 PR Interval:  158 QRS Duration:  74 QT Interval:  358 QTC Calculation: 408 R Axis:   65  Text Interpretation: Normal sinus rhythm Normal ECG Confirmed by Verne Carrow [phone]) on 04/19/2023 10:31:55 AM    Recent Labs: 04/15/2023: ALT 13   Lipid Panel    Component Value Date/Time   CHOL 210 (H) 04/15/2023 0812   TRIG 95 04/15/2023 0812   HDL 60 04/15/2023 0812   CHOLHDL 3.5 04/15/2023 0812   CHOLHDL 3.7 05/14/2015 0958   VLDL 25 05/14/2015 0958   LDLCALC 133 (H) 04/15/2023 0812     Wt Readings from Last 3 Encounters:  04/19/23 66.4 kg  04/10/23 67.6 kg  04/08/23 66 kg    Assessment and Plan:   1. SVT:   He is known to have SVT. Echo March 2023 with LVEF=70-75%. Trivial MR. Cardiac monitor April 2023  with 4 runs of SVT with longest at 23 minutes. He was seen by Dr. Raul Del 09/28/21 and plans were to continue medical therapy with Cardizem alone. No recent episodes of SVT. Will continue Cardizem CD 120 mg daily and he will use short acting Cardizem for episodes of SVT. If he has long runs of symptomatic SVT, will need consideration for SVT ablation.   2. CAD without angina: Abnormal coronary calcium score in 2024. Continue ASA. He stopped Lipitor thinking it may be causing his dysuria but this did not improve when he held his Lipitor. Will start Crestor 10 mg daily. Repeat lipids and LFTs.    3. Hyperlipidemia: See above. Start Crestor 10 mg daily.    Labs/ tests ordered today include:   Orders Placed This Encounter  Procedures   Lipid panel   Hepatic function panel   EKG 12-Lead   Disposition:   F/U with me in 12 months.   Signed, Verne Carrow, MD 04/19/2023 11:06 AM    [facility] [address] [place], [place], [place]  [address] Phone: [phone]; Fax: [phone]

## 2023-04-18 NOTE — Telephone Encounter (Signed)
Pharmacy Patient Advocate Encounter  Received notification from [facility] ADVANTAGE/RX ADVANCE that Prior Authorization for Nurtec 75mg  has been APPROVED from 04/18/2023 to 07/17/2023

## 2023-04-19 ENCOUNTER — Ambulatory Visit: Payer: PPO

## 2023-04-19 ENCOUNTER — Ambulatory Visit: Payer: PPO | Attending: Cardiovascular Disease | Admitting: Cardiovascular Disease

## 2023-04-19 ENCOUNTER — Encounter: Payer: Self-pay | Admitting: Cardiovascular Disease

## 2023-04-19 VITALS — BP 122/78 | HR 84 | Ht 70.0 in | Wt 146.4 lb

## 2023-04-19 DIAGNOSIS — E785 Hyperlipidemia, unspecified: Secondary | ICD-10-CM | POA: Diagnosis not present

## 2023-04-19 DIAGNOSIS — I471 Supraventricular tachycardia, unspecified: Secondary | ICD-10-CM | POA: Diagnosis not present

## 2023-04-19 DIAGNOSIS — I2510 Atherosclerotic heart disease of native coronary artery without angina pectoris: Secondary | ICD-10-CM | POA: Diagnosis not present

## 2023-04-19 MED ORDER — ONDANSETRON 8 MG PO TBDP: 4.0000 mg | ORAL_TABLET | Freq: Three times a day (TID) | ORAL | 5 refills | Status: DC | PRN

## 2023-04-19 MED ORDER — DILTIAZEM HCL ER COATED BEADS 120 MG PO CP24: 120.0000 mg | ORAL_CAPSULE | Freq: Every day | ORAL | 3 refills | Status: DC

## 2023-04-19 MED ORDER — ROSUVASTATIN CALCIUM 10 MG PO TABS: 10.0000 mg | ORAL_TABLET | Freq: Every day | ORAL | 3 refills | Status: DC

## 2023-04-19 MED ORDER — DILTIAZEM HCL 30 MG PO TABS: ORAL_TABLET | ORAL | 6 refills | Status: DC

## 2023-04-19 NOTE — Addendum Note (Signed)
Addended by: Lendon Ka on: 04/19/2023 01:23 PM   Modules accepted: Orders

## 2023-04-19 NOTE — Patient Instructions (Signed)
Medication Instructions:  Your physician has recommended you make the following change in your medication:  1.) start rosuvastatin (Crestor) 10 mg - take one tablet daily  *If you need a refill on your cardiac medications before your next appointment, please call your pharmacy*   Lab Work: [name] in about 12 weeks for blood work (lipids, liver function)  If you have labs (blood work) drawn today and your tests are completely normal, you will receive your results only by: MyChart Message (if you have MyChart) OR A paper copy in the mail If you have any lab test that is abnormal or we need to change your treatment, we will call you to review the results.   Testing/Procedures: none   Follow-Up: At [facility], you and your health needs are our priority.  As part of our continuing mission to provide you with exceptional heart care, we have created designated Provider Care Teams.  These Care Teams include your primary Cardiologist (physician) and Advanced Practice Providers (APPs -  Physician Assistants and Nurse Practitioners) who all work together to provide you with the care you need, when you need it.   Your next appointment:   12 month(s)  Provider:   Verne Carrow, MD     Other Instructions

## 2023-04-19 NOTE — Telephone Encounter (Signed)
Pharmacy Patient Advocate Encounter  Received notification from [facility] ADVANTAGE/RX ADVANCE that Prior Authorization for ONDANSETRON 8MG  has been DENIED.  Full denial letter will be uploaded to the media tab. See denial reason below.

## 2023-04-21 ENCOUNTER — Other Ambulatory Visit: Payer: Self-pay | Admitting: Neurology

## 2023-04-29 ENCOUNTER — Encounter: Payer: Self-pay | Admitting: Neurology

## 2023-05-02 ENCOUNTER — Telehealth: Payer: Self-pay

## 2023-05-02 ENCOUNTER — Other Ambulatory Visit (INDEPENDENT_AMBULATORY_CARE_PROVIDER_SITE_OTHER): Payer: PPO

## 2023-05-02 DIAGNOSIS — C61 Malignant neoplasm of prostate: Secondary | ICD-10-CM

## 2023-05-02 LAB — PSA: PSA: 2.48 ng/mL (ref 0.10–4.00)

## 2023-05-02 NOTE — Telephone Encounter (Signed)
 [employer] Enrollment Nofication : Patient meets program eligibility Requirements til the end of 2025.

## 2023-05-06 ENCOUNTER — Telehealth: Payer: Self-pay | Admitting: Family Medicine

## 2023-05-06 NOTE — Telephone Encounter (Signed)
 Patient called asking if he can take more than 3500mg  of tart cherry extract? He said that his is helping his pain but it does not seem to last very long. He is currently taking it spaced throughout the day (morning, noon and night).  Please advise.

## 2023-05-07 NOTE — Telephone Encounter (Signed)
Sent patient MyChart message with recommendations.  

## 2023-05-08 ENCOUNTER — Ambulatory Visit: Payer: Self-pay | Admitting: Licensed Clinical Social Worker

## 2023-05-09 ENCOUNTER — Ambulatory Visit: Payer: PPO | Admitting: Family Medicine

## 2023-05-09 ENCOUNTER — Encounter: Payer: Self-pay | Admitting: Family Medicine

## 2023-05-09 VITALS — BP 126/82 | HR 99 | Ht 70.0 in | Wt 147.0 lb

## 2023-05-09 DIAGNOSIS — E538 Deficiency of other specified B group vitamins: Secondary | ICD-10-CM | POA: Diagnosis not present

## 2023-05-09 DIAGNOSIS — C61 Malignant neoplasm of prostate: Secondary | ICD-10-CM

## 2023-05-09 DIAGNOSIS — Z636 Dependent relative needing care at home: Secondary | ICD-10-CM | POA: Diagnosis not present

## 2023-05-09 DIAGNOSIS — M5416 Radiculopathy, lumbar region: Secondary | ICD-10-CM | POA: Diagnosis not present

## 2023-05-09 MED ORDER — CYANOCOBALAMIN 1000 MCG/ML IJ SOLN
1000.0000 ug | Freq: Once | INTRAMUSCULAR | Status: AC
  Administered 2023-05-09: 1000 ug via INTRAMUSCULAR

## 2023-05-09 NOTE — Assessment & Plan Note (Signed)
Injection given today. 

## 2023-05-09 NOTE — Progress Notes (Signed)
 [facility] [address] [place] [address] Phone: [phone] Subjective:   ISusannah Gully, am serving as a scribe for Dr. Arthea Claudene.  I'William seeing this patient by the request  of:  Kip Righter, MD  CC: Back pain follow-up and other complaints  YEP:Dlagzrupcz  04/10/2023 History of depression.  Patient has no homicidal or suicidal ideation.  Has had difficulty at home with this previously.  Patient was looking to find another provider on his own but never did so.  Likely we do have 1 in the office now and will refer him accordingly.     Patient continues to have more of the low back pain.  Has not gone through with the epidural yet.  We discussed that I do think that that could be beneficial.  Patient is still preoccupied with the cancer but did have a PSA that went down and that is making patient optimistic.  B12 can be playing a role as well and given a B12 injection today.  With reviewing patient's outside records, outside test results as well as discussing with patient total time today 32 minutes     Updated 05/09/2023 William [name] is a 63 y.o. male coming in with complaint of PSA levels. Will see her soon. Talked to her via phone already. Back injection helped.  States that it is approximately 100% better at the moment.  And very helpful with this.       Past Medical History:  Diagnosis Date   Allergy    Anxiety    Chronic kidney disease    Depression    Diverticulitis    Gastroparesis    Heart murmur    Migraine headache    Mitral valve prolapse    SVT (supraventricular tachycardia) (HCC)    Past Surgical History:  Procedure Laterality Date   COLONOSCOPY W/ BIOPSIES  2023   RADIOACTIVE SEED IMPLANT N/A 01/19/2022   Procedure: RADIOACTIVE SEED IMPLANT/BRACHYTHERAPY IMPLANT;  Surgeon: Watt Rush, MD;  Location: [facility];  Service: Urology;  Laterality: N/A;   SPACE OAR INSTILLATION N/A 01/19/2022    Procedure: SPACE OAR INSTILLATION;  Surgeon: Watt Rush, MD;  Location: [facility];  Service: Urology;  Laterality: N/A;   Social History   Socioeconomic History   Marital status: Married    Spouse name: Not on file   Number of children: 0   Years of education: Not on file   Highest education level: Bachelor's degree (e.g., BA, AB, BS)  Occupational History   Not on file  Tobacco Use   Smoking status: Never   Smokeless tobacco: Never  Vaping Use   Vaping status: Never Used  Substance and Sexual Activity   Alcohol use: No    Alcohol/week: 0.0 standard drinks of alcohol   Drug use: No   Sexual activity: Yes    Partners: Female  Other Topics Concern   Not on file  Social History Narrative   Pt is married lives with spouse in 1 story apartment he has No children   BS degree- he is right handed   Drinks soda  Ginger ale, no coffee no tea   Right handed   Social Drivers of Corporate Investment Banker Strain: Low Risk  (10/25/2021)   Overall Financial Resource Strain (CARDIA)    Difficulty of Paying Living Expenses: Not hard at all  Food Insecurity: No Food Insecurity (10/25/2021)   Hunger Vital Sign    Worried  About Running Out of Food in the Last Year: Never true    Ran Out of Food in the Last Year: Never true  Transportation Needs: No Transportation Needs (10/25/2021)   PRAPARE - Administrator, Civil Service (Medical): No    Lack of Transportation (Non-Medical): No  Physical Activity: Not on file  Stress: Stress Concern Present (10/25/2021)   [employer] of Occupational Health - Occupational Stress Questionnaire    Feeling of Stress : Rather much  Social Connections: Not on file   Allergies  Allergen Reactions   Erythromycin Anaphylaxis and Nausea And Vomiting   Nalbuphine Other (See Comments)    States loss of consciousness    Cefdinir  Diarrhea, Nausea And Vomiting and Rash   Cephalexin Diarrhea and Nausea And Vomiting    Cephalosporins Diarrhea and Nausea And Vomiting   Hydrocodone -Acetaminophen  Nausea And Vomiting   Ketorolac  Nausea And Vomiting    Other reaction(s): upset stomach   Prochlorperazine Edisylate Other (See Comments)    uncontrollable shake - tremor    Restasis  [Cyclosporine ] Other (See Comments)    migraines   Tyloxapol Swelling   Bacitracin-Polymyxin B     Other reaction(s): rash gets worse Other reaction(s): rash gets worse   Codeine  Other (See Comments)    Other reaction(s): stomach upset   Nalbuphine Hcl     Other reaction(s): stomach upset   Prochlorperazine     Other reaction(s): tremors/vomiting   Propranolol  Diarrhea   Rosuvastatin  Diarrhea   Topamax [Topiramate] Diarrhea and Nausea And Vomiting   Zetia  [Ezetimibe ] Diarrhea   Cefdinir  Diarrhea, Nausea And Vomiting and Rash    Other reaction(s): too stronge   Doxycycline Hyclate Nausea Only and Other (See Comments)    abd pain   Iodinated Contrast Media Other (See Comments)    Lightheaded, flushed feeling (explained to patient these are normal side effects of IV iodinated contrast, not allergies, but he wanted this noted in the epic; he tolerated epidural steroid injections w/ contrast w/o difficulty)   Ketoconazole  Itching   Ketorolac  Tromethamine  Rash    REACTION: Reaction not known   Latex Itching and Rash   Neosporin [Neomycin-Bacitracin Zn-Polymyx] Rash   Oxycodone -Acetaminophen  Other (See Comments)    Tylox caused constipation   Prednisone  Nausea And Vomiting    Oral route, only Other reaction(s): stomach spasms   Family History  Problem Relation Age of Onset   Hypertension Mother    Lung cancer Father 21 - 21   Hypertension Brother    Kidney failure Maternal Grandmother    Prostate cancer Neg Hx    Kidney cancer Neg Hx      Current Outpatient Medications (Cardiovascular):    diltiazem  (CARDIZEM  CD) 120 MG 24 hr capsule, Take 1 capsule (120 mg total) by mouth daily.   diltiazem  (CARDIZEM ) 30 MG tablet,  TAKE (1) TABLET AS NEEDED FOR PALPITATIONS   rosuvastatin  (CRESTOR ) 10 MG tablet, Take 1 tablet (10 mg total) by mouth daily.  Current Outpatient Medications (Respiratory):    azelastine  (ASTELIN ) 0.1 % nasal spray, 1-2 puffs in each nostril Nasally Twice a day   benzonatate  (TESSALON ) 100 MG capsule, Take 100-200 mg by mouth 3 (three) times daily as needed.   fluticasone  (FLONASE ) 50 MCG/ACT nasal spray, Place 2 sprays into both nostrils daily.   promethazine  (PHENERGAN ) 25 MG tablet, Take 25 mg by mouth every 4 (four) hours as needed for nausea.  Current Outpatient Medications (Analgesics):    aspirin  EC 81 MG tablet, Take 1 tablet (  81 mg total) by mouth daily. Swallow whole. (Patient not taking: Reported on 04/19/2023)   EMGALITY  120 MG/ML SOAJ, INJECT 120MG  (1 PEN) UNDER THE SKIN EVERY MONTH   Rimegepant Sulfate (NURTEC) 75 MG TBDP, Take 1 tablet (75 mg total) by mouth as needed for up to 1 dose (Maximum 1 tablet in 24 hours.).   Current Outpatient Medications (Other):    amoxicillin  (AMOXIL ) 500 MG capsule, Take 500 mg by mouth as needed (Denist visits).   baclofen  (LIORESAL ) 10 MG tablet, Take 0.5 tablets (5 mg total) by mouth at bedtime as needed for muscle spasms.   clobetasol  (TEMOVATE ) 0.05 % external solution, Apply 1 Application topically 2 (two) times daily. Apply to scalp x 2 weeks prn itch   clobetasol  cream (TEMOVATE ) 0.05 %, Apply 1 Application topically 2 (two) times daily. To hands and feet x 2 weeks prn   hydrocortisone (ANUSOL-HC) 2.5 % rectal cream, SMARTSIG:Rectally 3 Times Daily PRN   hydrOXYzine (ATARAX) 50 MG tablet, Take 25-50 mg by mouth 2 (two) times daily as needed for itching.   metoCLOPramide  (REGLAN ) 5 MG tablet, Take 5 mg by mouth as needed.   ondansetron  (ZOFRAN -ODT) 8 MG disintegrating tablet, Take 0.5 tablets (4 mg total) by mouth every 8 (eight) hours as needed for nausea.   polyethylene glycol powder (GLYCOLAX /MIRALAX ) 17 GM/SCOOP powder, Take 17 g by  mouth as needed for moderate constipation.   silodosin (RAPAFLO) 8 MG CAPS capsule, Take 8 mg by mouth daily with breakfast.   traZODone  (DESYREL ) 50 MG tablet, Take 1 tablet (50 mg total) by mouth at bedtime as needed for sleep.   Reviewed prior external information including notes and imaging from  primary care provider As well as notes that were available from care everywhere and other healthcare systems.  Past medical history, social, surgical and family history all reviewed in electronic medical record.  No pertanent information unless stated regarding to the chief complaint.   Review of Systems:  No headache, visual changes, nausea, vomiting, diarrhea, constipation, dizziness, abdominal pain, skin rash, fevers, chills, night sweats, weight loss, swollen lymph nodes,  joint swelling, chest pain, shortness of breath, mood changes. POSITIVE muscle aches, body aches  Objective  Blood pressure 126/82, pulse 99, height 5' 10 (1.778 William), weight 147 lb (66.7 kg), SpO2 97%.   General: No apparent distress alert and oriented x3 mood and affect normal, dressed appropriately.  HEENT: Pupils equal, extraocular movements intact  Respiratory: Patient's speak in full sentences and does not appear short of breath  Cardiovascular: No lower extremity edema, non tender, no erythema  Sitting comfortably overall.  The patient's back does still have some mild loss of lordosis.  Patient is able to get out of a seated position much more regular and what he was doing previously.    Impression and Recommendations:     The above documentation has been reviewed and is accurate and complete William [name] William Siddhartha Hoback, DO

## 2023-05-09 NOTE — Assessment & Plan Note (Signed)
 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  Is talking to a psychologist

## 2023-05-09 NOTE — Patient Instructions (Signed)
 Future lab ordered As Dr. Chauncy Lean what would be the symptoms or lab value that would cause him to order a pet scan See you again in 2-3 months

## 2023-05-09 NOTE — Assessment & Plan Note (Signed)
 Continues to make some improvement in the last epidural has been significantly beneficial.  Hopeful that this will make a bigger difference.  We discussed with the back pain as well as patient's prostate for a long amount of time.  Total time with patient 31 minutes.

## 2023-05-14 ENCOUNTER — Ambulatory Visit: Payer: PPO | Admitting: Family Medicine

## 2023-05-16 ENCOUNTER — Ambulatory Visit: Payer: Self-pay | Admitting: Licensed Clinical Social Worker

## 2023-05-24 NOTE — Progress Notes (Unsigned)
[name] [address] [place] [address] Phone: [phone] Subjective:   William [name], am serving as a scribe for Dr. Antoine Primas. I'm seeing this patient by the request  of:  Farris Has, MD  CC: low back pain, prostate cancer , anxiety   [id number]  05/09/2023 Continues to make some improvement in the last epidural has been significantly beneficial.  Hopeful that this will make a bigger difference.  We discussed with the back pain as well as patient's prostate for a long amount of time.  Total time with patient 31 minutes.   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  Is talking to a psychologist      Injection given today.   Update 05/27/2023 William [name] is a 63 y.o. male coming in with complaint of lumbar spine pain. Epidural helped to reduce his pain. Patient states that his pain is now very mild.   Would like referral to [facility] to gain strength.   Also wants to discuss PSA values.   Knee injection for mom and urgent referral to [facility]      Past Medical History:  Diagnosis Date   Allergy    Anxiety    Chronic kidney disease    Depression    Diverticulitis    Gastroparesis    Heart murmur    Migraine headache    Mitral valve prolapse    SVT (supraventricular tachycardia) (HCC)    Past Surgical History:  Procedure Laterality Date   COLONOSCOPY W/ BIOPSIES  2023   RADIOACTIVE SEED IMPLANT N/A 01/19/2022   Procedure: RADIOACTIVE SEED IMPLANT/BRACHYTHERAPY IMPLANT;  Surgeon: Bjorn Pippin, MD;  Location: [facility];  Service: Urology;  Laterality: N/A;   SPACE OAR INSTILLATION N/A 01/19/2022   Procedure: SPACE OAR INSTILLATION;  Surgeon: Bjorn Pippin, MD;  Location: [facility];  Service: Urology;  Laterality: N/A;   Social History   Socioeconomic History   Marital status: Married    Spouse name: Not on file   Number of children: 0   Years of education: Not on file    Highest education level: Bachelor's degree (e.g., BA, AB, BS)  Occupational History   Not on file  Tobacco Use   Smoking status: Never   Smokeless tobacco: Never  Vaping Use   Vaping status: Never Used  Substance and Sexual Activity   Alcohol use: No    Alcohol/week: 0.0 standard drinks of alcohol   Drug use: No   Sexual activity: Yes    Partners: Female  Other Topics Concern   Not on file  Social History Narrative   Pt is married lives with spouse in 1 story apartment he has No children   BS degree- he is right handed   Drinks soda  Ginger ale, no coffee no tea   Right handed   Social Drivers of Corporate investment banker Strain: Low Risk  (10/25/2021)   Overall Financial Resource Strain (CARDIA)    Difficulty of Paying Living Expenses: Not hard at all  Food Insecurity: No Food Insecurity (10/25/2021)   Hunger Vital Sign    Worried About Running Out of Food in the Last Year: Never true    Ran Out of Food in the Last Year: Never true  Transportation Needs: No Transportation Needs (10/25/2021)   PRAPARE - Administrator, Civil Service (Medical): No    Lack of Transportation (Non-Medical): No  Physical Activity: Not on file  Stress: Stress Concern Present (10/25/2021)   [employer] of Occupational Health - Occupational Stress Questionnaire    Feeling of Stress : Rather much  Social Connections: Not on file   Allergies  Allergen Reactions   Erythromycin Anaphylaxis and Nausea And Vomiting   Nalbuphine Other (See Comments)    States loss of consciousness    Cefdinir Diarrhea, Nausea And Vomiting and Rash   Cephalexin Diarrhea and Nausea And Vomiting   Cephalosporins Diarrhea and Nausea And Vomiting   Hydrocodone-Acetaminophen Nausea And Vomiting   Ketorolac Nausea And Vomiting    Other reaction(s): upset stomach   Prochlorperazine Edisylate Other (See Comments)    uncontrollable shake - tremor    Restasis [Cyclosporine] Other (See Comments)     migraines   Tyloxapol Swelling   Bacitracin-Polymyxin B     Other reaction(s): rash gets worse Other reaction(s): rash gets worse   Codeine Other (See Comments)    Other reaction(s): stomach upset   Nalbuphine Hcl     Other reaction(s): stomach upset   Prochlorperazine     Other reaction(s): tremors/vomiting   Propranolol Diarrhea   Rosuvastatin Diarrhea   Topamax [Topiramate] Diarrhea and Nausea And Vomiting   Zetia [Ezetimibe] Diarrhea   Cefdinir Diarrhea, Nausea And Vomiting and Rash    Other reaction(s): too stronge   Doxycycline Hyclate Nausea Only and Other (See Comments)    abd pain   Iodinated Contrast Media Other (See Comments)    Lightheaded, flushed feeling (explained to patient these are normal side effects of IV iodinated contrast, not allergies, but he wanted this noted in the epic; he tolerated epidural steroid injections w/ contrast w/o difficulty)   Ketoconazole Itching   Ketorolac Tromethamine Rash    REACTION: Reaction not known   Latex Itching and Rash   Neosporin [Neomycin-Bacitracin Zn-Polymyx] Rash   Oxycodone-Acetaminophen Other (See Comments)    Tylox caused constipation   Prednisone Nausea And Vomiting    Oral route, only Other reaction(s): stomach spasms   Family History  Problem Relation Age of Onset   Hypertension Mother    Lung cancer Father 75 - 23   Hypertension Brother    Kidney failure Maternal Grandmother    Prostate cancer Neg Hx    Kidney cancer Neg Hx      Current Outpatient Medications (Cardiovascular):    diltiazem (CARDIZEM CD) 120 MG 24 hr capsule, Take 1 capsule (120 mg total) by mouth daily.   diltiazem (CARDIZEM) 30 MG tablet, TAKE (1) TABLET AS NEEDED FOR PALPITATIONS   rosuvastatin (CRESTOR) 10 MG tablet, Take 1 tablet (10 mg total) by mouth daily.  Current Outpatient Medications (Respiratory):    azelastine (ASTELIN) 0.1 % nasal spray, 1-2 puffs in each nostril Nasally Twice a day   benzonatate (TESSALON) 100 MG  capsule, Take 100-200 mg by mouth 3 (three) times daily as needed.   fluticasone (FLONASE) 50 MCG/ACT nasal spray, Place 2 sprays into both nostrils daily.   promethazine (PHENERGAN) 25 MG tablet, Take 25 mg by mouth every 4 (four) hours as needed for nausea.  Current Outpatient Medications (Analgesics):    aspirin EC 81 MG tablet, Take 1 tablet (81 mg total) by mouth daily. Swallow whole.   EMGALITY 120 MG/ML SOAJ, INJECT 120MG  (1 PEN) UNDER THE SKIN EVERY MONTH   Rimegepant Sulfate (NURTEC) 75 MG TBDP, Take 1 tablet (75 mg total) by mouth as needed for up to 1 dose (Maximum 1 tablet in 24 hours.).   Current Outpatient Medications (Other):  amoxicillin (AMOXIL) 500 MG capsule, Take 500 mg by mouth as needed (Denist visits).   baclofen (LIORESAL) 10 MG tablet, Take 0.5 tablets (5 mg total) by mouth at bedtime as needed for muscle spasms.   clobetasol (TEMOVATE) 0.05 % external solution, Apply 1 Application topically 2 (two) times daily. Apply to scalp x 2 weeks prn itch   clobetasol cream (TEMOVATE) 0.05 %, Apply 1 Application topically 2 (two) times daily. To hands and feet x 2 weeks prn   hydrocortisone (ANUSOL-HC) 2.5 % rectal cream, SMARTSIG:Rectally 3 Times Daily PRN   hydrOXYzine (ATARAX) 50 MG tablet, Take 25-50 mg by mouth 2 (two) times daily as needed for itching.   metoCLOPramide (REGLAN) 5 MG tablet, Take 5 mg by mouth as needed.   ondansetron (ZOFRAN-ODT) 8 MG disintegrating tablet, Take 0.5 tablets (4 mg total) by mouth every 8 (eight) hours as needed for nausea.   polyethylene glycol powder (GLYCOLAX/MIRALAX) 17 GM/SCOOP powder, Take 17 g by mouth as needed for moderate constipation.   silodosin (RAPAFLO) 8 MG CAPS capsule, Take 8 mg by mouth daily with breakfast.   traZODone (DESYREL) 50 MG tablet, Take 1 tablet (50 mg total) by mouth at bedtime as needed for sleep.   Reviewed prior external information including notes and imaging from  primary care provider As well as  notes that were available from care everywhere and other healthcare systems.  Past medical history, social, surgical and family history all reviewed in electronic medical record.  No pertanent information unless stated regarding to the chief complaint.   Review of Systems:  No headache, visual changes, nausea, vomiting, diarrhea, constipation, dizziness, abdominal pain, skin rash, fevers, chills, night sweats, weight loss, swollen lymph nodes, joint swelling, chest pain, shortness of breath, mood changes. POSITIVE muscle aches, body aches   Objective  Blood pressure 116/76, pulse 84, height 5\' 10"  (1.778 m), weight 143 lb (64.9 kg), SpO2 98%.   General: No apparent distress alert and oriented x3 mood and affect normal, dressed appropriately.  HEENT: Pupils equal, extraocular movements intact  Respiratory: Patient's speak in full sentences and does not appear short of breath  Cardiovascular: No lower extremity edema, non tender, no erythema  Patient is with significant tightness in the back.  Sitting relatively comfortably.  Does have some tenderness to palpation in the paraspinal musculature.    Impression and Recommendations:    The above documentation has been reviewed and is accurate and complete Judi Saa, DO

## 2023-05-27 ENCOUNTER — Ambulatory Visit: Payer: PPO | Admitting: Family Medicine

## 2023-05-27 VITALS — BP 116/76 | HR 84 | Ht 70.0 in | Wt 143.0 lb

## 2023-05-27 DIAGNOSIS — M5416 Radiculopathy, lumbar region: Secondary | ICD-10-CM | POA: Diagnosis not present

## 2023-05-27 NOTE — Patient Instructions (Signed)
Pt at [facility] is great. 6 to 7 weeks for follow up  Another follow up after appointment with Dr. Wilson Singer.

## 2023-05-27 NOTE — Assessment & Plan Note (Signed)
Do feel patient will do very well with formal physical therapy.  Discussed with him at great length and he would like to focus on that over the course of the next several weeks.  Patient would like to do this and we will hold on getting any other PSA until April which I think is a good idea.  Depending on which way the PSA goes we will see if patient needs to do the possibility of a PET scan.  Encouraged him to continue to follow-up with his neurologist.  Hopeful though that he will start making some improvement.  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  Follow-up with me again 6 to 8 weeks.  Total time with patient 33 minutes

## 2023-05-30 ENCOUNTER — Ambulatory Visit: Payer: Self-pay | Admitting: Licensed Clinical Social Worker

## 2023-06-06 ENCOUNTER — Ambulatory Visit: Payer: Self-pay | Admitting: Licensed Clinical Social Worker

## 2023-06-11 ENCOUNTER — Ambulatory Visit: Payer: PPO | Admitting: Physical Therapy

## 2023-06-20 ENCOUNTER — Ambulatory Visit: Payer: PPO | Admitting: Licensed Clinical Social Worker

## 2023-07-02 ENCOUNTER — Other Ambulatory Visit: Payer: Self-pay

## 2023-07-02 MED ORDER — EMGALITY 120 MG/ML ~~LOC~~ SOAJ: 120.0000 mg | SUBCUTANEOUS | 2 refills | Status: DC

## 2023-07-02 NOTE — Telephone Encounter (Signed)
 PER [facility], Script for [facility] needs to reflect a 890 day supply.    New script for 90 day supply sent.

## 2023-07-05 NOTE — Progress Notes (Signed)
 [name] [address] [place] [address] Phone: [phone] Subjective:   William [name], am serving as a scribe for Dr. Antoine Primas.  I'm seeing this patient by the request  of:  Farris Has, MD  CC: Back pain follow-up  WGN:[id number]  05/09/2023 Continues to make some improvement in the last epidural has been significantly beneficial.  Hopeful that this will make a bigger difference.  We discussed with the back pain as well as patient's prostate for a long amount of time.  Total time with patient 31 minutes.     Injection given today for B12  Update 07/08/2023 William [name] is a 63 y.o. male coming in with complaint of lumbar spine pain. Patient states that his back has not been bothering him as much lately.   Having brain fog, fatigue. Would like B12. What about shingles vaccine.   Next appt with Dr. Annabell Howells 08/12/2023.   Unable to tolerate amoxicillian 875 but can take Amoxicillin 500mg . Which one is better before his dental procedures. Also would like alternative to rosuvastatin. Will call other provider as well.         Past Medical History:  Diagnosis Date   Allergy    Anxiety    Chronic kidney disease    Depression    Diverticulitis    Gastroparesis    Heart murmur    Migraine headache    Mitral valve prolapse    SVT (supraventricular tachycardia) (HCC)    Past Surgical History:  Procedure Laterality Date   COLONOSCOPY W/ BIOPSIES  2023   RADIOACTIVE SEED IMPLANT N/A 01/19/2022   Procedure: RADIOACTIVE SEED IMPLANT/BRACHYTHERAPY IMPLANT;  Surgeon: Bjorn Pippin, MD;  Location: [facility];  Service: Urology;  Laterality: N/A;   SPACE OAR INSTILLATION N/A 01/19/2022   Procedure: SPACE OAR INSTILLATION;  Surgeon: Bjorn Pippin, MD;  Location: [facility];  Service: Urology;  Laterality: N/A;   Social History   Socioeconomic History   Marital status: Married    Spouse name: Not on  file   Number of children: 0   Years of education: Not on file   Highest education level: Bachelor's degree (e.g., BA, AB, BS)  Occupational History   Not on file  Tobacco Use   Smoking status: Never   Smokeless tobacco: Never  Vaping Use   Vaping status: Never Used  Substance and Sexual Activity   Alcohol use: No    Alcohol/week: 0.0 standard drinks of alcohol   Drug use: No   Sexual activity: Yes    Partners: Female  Other Topics Concern   Not on file  Social History Narrative   Pt is married lives with spouse in 1 story apartment he has No children   BS degree- he is right handed   Drinks soda  Ginger ale, no coffee no tea   Right handed   Social Drivers of Corporate investment banker Strain: Low Risk  (10/25/2021)   Overall Financial Resource Strain (CARDIA)    Difficulty of Paying Living Expenses: Not hard at all  Food Insecurity: No Food Insecurity (10/25/2021)   Hunger Vital Sign    Worried About Running Out of Food in the Last Year: Never true    Ran Out of Food in the Last Year: Never true  Transportation Needs: No Transportation Needs (10/25/2021)   PRAPARE - Administrator, Civil Service (Medical): No    Lack of Transportation (Non-Medical): No  Physical Activity: Not on file  Stress: Stress Concern Present (10/25/2021)   [employer] of Occupational Health - Occupational Stress Questionnaire    Feeling of Stress : Rather much  Social Connections: Not on file   Allergies  Allergen Reactions   Erythromycin Anaphylaxis and Nausea And Vomiting   Nalbuphine Other (See Comments)    States loss of consciousness    Cefdinir Diarrhea, Nausea And Vomiting and Rash   Cephalexin Diarrhea and Nausea And Vomiting   Cephalosporins Diarrhea and Nausea And Vomiting   Hydrocodone-Acetaminophen Nausea And Vomiting   Ketorolac Nausea And Vomiting    Other reaction(s): upset stomach   Prochlorperazine Edisylate Other (See Comments)    uncontrollable shake -  tremor    Restasis [Cyclosporine] Other (See Comments)    migraines   Tyloxapol Swelling   Bacitracin-Polymyxin B     Other reaction(s): rash gets worse Other reaction(s): rash gets worse   Codeine Other (See Comments)    Other reaction(s): stomach upset   Nalbuphine Hcl     Other reaction(s): stomach upset   Prochlorperazine     Other reaction(s): tremors/vomiting   Propranolol Diarrhea   Rosuvastatin Diarrhea   Topamax [Topiramate] Diarrhea and Nausea And Vomiting   Zetia [Ezetimibe] Diarrhea   Cefdinir Diarrhea, Nausea And Vomiting and Rash    Other reaction(s): too stronge   Doxycycline Hyclate Nausea Only and Other (See Comments)    abd pain   Iodinated Contrast Media Other (See Comments)    Lightheaded, flushed feeling (explained to patient these are normal side effects of IV iodinated contrast, not allergies, but he wanted this noted in the epic; he tolerated epidural steroid injections w/ contrast w/o difficulty)   Ketoconazole Itching   Ketorolac Tromethamine Rash    REACTION: Reaction not known   Latex Itching and Rash   Neosporin [Neomycin-Bacitracin Zn-Polymyx] Rash   Oxycodone-Acetaminophen Other (See Comments)    Tylox caused constipation   Prednisone Nausea And Vomiting    Oral route, only Other reaction(s): stomach spasms   Family History  Problem Relation Age of Onset   Hypertension Mother    Lung cancer Father 32 - 41   Hypertension Brother    Kidney failure Maternal Grandmother    Prostate cancer Neg Hx    Kidney cancer Neg Hx      Current Outpatient Medications (Cardiovascular):    diltiazem (CARDIZEM CD) 120 MG 24 hr capsule, Take 1 capsule (120 mg total) by mouth daily.   diltiazem (CARDIZEM) 30 MG tablet, TAKE (1) TABLET AS NEEDED FOR PALPITATIONS   rosuvastatin (CRESTOR) 10 MG tablet, Take 1 tablet (10 mg total) by mouth daily.  Current Outpatient Medications (Respiratory):    azelastine (ASTELIN) 0.1 % nasal spray, 1-2 puffs in each nostril  Nasally Twice a day   benzonatate (TESSALON) 100 MG capsule, Take 100-200 mg by mouth 3 (three) times daily as needed.   fluticasone (FLONASE) 50 MCG/ACT nasal spray, Place 2 sprays into both nostrils daily.   promethazine (PHENERGAN) 25 MG tablet, Take 25 mg by mouth every 4 (four) hours as needed for nausea.  Current Outpatient Medications (Analgesics):    aspirin EC 81 MG tablet, Take 1 tablet (81 mg total) by mouth daily. Swallow whole.   Galcanezumab-gnlm (EMGALITY) 120 MG/ML SOAJ, Inject 120 mg as directed every 28 (twenty-eight) days. INJECT 120MG  (1 PEN) UNDER THE SKIN EVERY MONTH   Rimegepant Sulfate (NURTEC) 75 MG TBDP, Take 1 tablet (75 mg total) by mouth as needed for up  to 1 dose (Maximum 1 tablet in 24 hours.).   Current Outpatient Medications (Other):    amoxicillin (AMOXIL) 500 MG tablet, Take 1 tablet (500 mg total) by mouth 2 (two) times daily.   baclofen (LIORESAL) 10 MG tablet, Take 0.5 tablets (5 mg total) by mouth at bedtime as needed for muscle spasms.   clobetasol (TEMOVATE) 0.05 % external solution, Apply 1 Application topically 2 (two) times daily. Apply to scalp x 2 weeks prn itch   clobetasol cream (TEMOVATE) 0.05 %, Apply 1 Application topically 2 (two) times daily. To hands and feet x 2 weeks prn   hydrocortisone (ANUSOL-HC) 2.5 % rectal cream, SMARTSIG:Rectally 3 Times Daily PRN   hydrOXYzine (ATARAX) 50 MG tablet, Take 25-50 mg by mouth 2 (two) times daily as needed for itching.   metoCLOPramide (REGLAN) 5 MG tablet, Take 5 mg by mouth as needed.   ondansetron (ZOFRAN-ODT) 8 MG disintegrating tablet, Take 0.5 tablets (4 mg total) by mouth every 8 (eight) hours as needed for nausea.   polyethylene glycol powder (GLYCOLAX/MIRALAX) 17 GM/SCOOP powder, Take 17 g by mouth as needed for moderate constipation.   silodosin (RAPAFLO) 8 MG CAPS capsule, Take 8 mg by mouth daily with breakfast.   traZODone (DESYREL) 50 MG tablet, Take 1 tablet (50 mg total) by mouth at  bedtime as needed for sleep.   Reviewed prior external information including notes and imaging from  primary care provider As well as notes that were available from care everywhere and other healthcare systems.  Past medical history, social, surgical and family history all reviewed in electronic medical record.  No pertanent information unless stated regarding to the chief complaint.   Review of Systems:  No headache, visual changes, nausea, vomiting, diarrhea, constipation, dizziness, abdominal pain, skin rash, fevers, chills, night sweats, weight loss, swollen lymph nodes, body aches, joint swelling, chest pain, shortness of breath, mood changes. POSITIVE muscle aches  Objective  Blood pressure 104/62, height 5\' 10"  (1.778 m), weight 145 lb (65.8 kg).   General: No apparent distress alert and oriented x3 mood and affect normal, dressed appropriately.  HEENT: Pupils equal, extraocular movements intact  Respiratory: Patient's speak in full sentences and does not appear short of breath  Cardiovascular: No lower extremity edema, non tender, no erythema  Patient sitting comfortably.  Does ambulate with mild antalgic gait noted.  Does have some mild weakness with going from a seated to standing position but nothing severe.  Neck exam has some mild loss of lordosis.    Impression and Recommendations:    The above documentation has been reviewed and is accurate and complete Judi Saa, DO

## 2023-07-08 ENCOUNTER — Ambulatory Visit: Payer: PPO | Admitting: Family Medicine

## 2023-07-08 ENCOUNTER — Encounter: Payer: Self-pay | Admitting: Family Medicine

## 2023-07-08 VITALS — BP 104/62 | Ht 70.0 in | Wt 145.0 lb

## 2023-07-08 DIAGNOSIS — C61 Malignant neoplasm of prostate: Secondary | ICD-10-CM

## 2023-07-08 DIAGNOSIS — M6289 Other specified disorders of muscle: Secondary | ICD-10-CM | POA: Diagnosis not present

## 2023-07-08 DIAGNOSIS — E538 Deficiency of other specified B group vitamins: Secondary | ICD-10-CM | POA: Diagnosis not present

## 2023-07-08 DIAGNOSIS — I059 Rheumatic mitral valve disease, unspecified: Secondary | ICD-10-CM

## 2023-07-08 DIAGNOSIS — M5030 Other cervical disc degeneration, unspecified cervical region: Secondary | ICD-10-CM | POA: Diagnosis not present

## 2023-07-08 MED ORDER — CYANOCOBALAMIN 1000 MCG/ML IJ SOLN
1000.0000 ug | Freq: Once | INTRAMUSCULAR | Status: AC
  Administered 2023-07-08: 1000 ug via INTRAMUSCULAR

## 2023-07-08 MED ORDER — AMOXICILLIN 500 MG PO TABS: 500.0000 mg | ORAL_TABLET | Freq: Two times a day (BID) | ORAL | 0 refills | Status: AC

## 2023-07-08 NOTE — Assessment & Plan Note (Signed)
 Given prescription for amoxicillin 500 mg to take 2 g before any dental cleaning procedures.

## 2023-07-08 NOTE — Assessment & Plan Note (Signed)
 Continue to follow-up with his urologist.  Patient is scheduled to see him again in April.  Will send recheck the PSA.  Patient is still concerned enough that it may be for the anxiety we should consider the possibility of a PET scan.

## 2023-07-08 NOTE — Assessment & Plan Note (Signed)
 B12 injection given today.

## 2023-07-08 NOTE — Assessment & Plan Note (Signed)
 Discussed potentially continuing to work with physical therapy.  Continue the home exercises on his own.  Will continue to monitor.  Discussed multiple different treatment options again.  Follow-up again in 6 to 8 weeks otherwise.

## 2023-07-08 NOTE — Assessment & Plan Note (Signed)
 Known arthritic changes continue to monitor discussed icing regimen and home exercises.  Increase activity slowly.  No other significant changes though at this time.

## 2023-07-08 NOTE — Patient Instructions (Addendum)
 B12 today Get the PSA at beginning of April Amoxicillin 500mg  BID for 10 days Can get shingles and flu shot just 2 weeks apart See me in 2 months

## 2023-07-12 ENCOUNTER — Ambulatory Visit: Attending: Family Medicine | Admitting: Physical Therapy

## 2023-07-12 DIAGNOSIS — M6281 Muscle weakness (generalized): Secondary | ICD-10-CM | POA: Diagnosis not present

## 2023-07-12 DIAGNOSIS — J309 Allergic rhinitis, unspecified: Secondary | ICD-10-CM | POA: Diagnosis not present

## 2023-07-12 DIAGNOSIS — H04123 Dry eye syndrome of bilateral lacrimal glands: Secondary | ICD-10-CM | POA: Diagnosis not present

## 2023-07-12 DIAGNOSIS — J019 Acute sinusitis, unspecified: Secondary | ICD-10-CM | POA: Diagnosis not present

## 2023-07-12 DIAGNOSIS — M5416 Radiculopathy, lumbar region: Secondary | ICD-10-CM | POA: Insufficient documentation

## 2023-07-12 DIAGNOSIS — R42 Dizziness and giddiness: Secondary | ICD-10-CM | POA: Diagnosis not present

## 2023-07-12 NOTE — Therapy (Signed)
 OUTPATIENT PHYSICAL THERAPY THORACOLUMBAR EVALUATION   Patient Name: William [name] MRN: [id number] DOB:1960-08-23, 63 y.o., male Today's Date: 07/12/2023  END OF SESSION:  PT End of Session - 07/12/23 0850     Visit Number 1    Date for PT Re-Evaluation 09/06/23    Authorization Type [employer]    Progress Note Due on Visit 10    PT Start Time 0846    PT Stop Time 0929    PT Time Calculation (min) 43 min    Activity Tolerance Patient tolerated treatment well             Past Medical History:  Diagnosis Date   Allergy    Anxiety    Chronic kidney disease    Depression    Diverticulitis    Gastroparesis    Heart murmur    Migraine headache    Mitral valve prolapse    SVT (supraventricular tachycardia) (HCC)    Past Surgical History:  Procedure Laterality Date   COLONOSCOPY W/ BIOPSIES  2023   RADIOACTIVE SEED IMPLANT N/A 01/19/2022   Procedure: RADIOACTIVE SEED IMPLANT/BRACHYTHERAPY IMPLANT;  Surgeon: Bjorn Pippin, MD;  Location: [facility];  Service: Urology;  Laterality: N/A;   SPACE OAR INSTILLATION N/A 01/19/2022   Procedure: SPACE OAR INSTILLATION;  Surgeon: Bjorn Pippin, MD;  Location: [facility];  Service: Urology;  Laterality: N/A;   Patient Active Problem List   Diagnosis Date Noted   Depression 10/11/2022   B12 deficiency 06/08/2022   Loss of weight 06/08/2022   Genetic testing 11/10/2021   Malignant neoplasm of prostate (HCC) 10/24/2021   Right calf pain 09/18/2021   Caregiver burden 10/18/2020   Arthritis of right acromioclavicular joint 02/18/2020   Right rotator cuff tear 12/18/2019   Degenerative cervical disc 05/07/2019   Left lumbar radiculopathy 10/10/2017   Migraine without aura and without status migrainosus, not intractable 06/22/2017   Vitamin D deficiency 05/28/2017   Scrotal pain 05/28/2017   Paroxysmal SVT (supraventricular tachycardia) (HCC) 05/28/2017   Mild left inguinal hernia  11/23/2016   Chronic pelvic pain in male 09/28/2016   Abdominal pain, left lower quadrant 09/28/2016   Isolated proteinuria without specific morphologic lesion 06/27/2016   Left testicular pain 06/27/2016   Acute left flank pain 06/27/2016   Epididymitis, left 06/05/2016   Pelvic floor dysfunction 04/01/2016   GERD (gastroesophageal reflux disease) 10/13/2015   Gastroparesis 10/13/2015   Intractable chronic migraine without aura and without status migrainosus 12/15/2013   Hyperlipidemia 04/04/2012   Mitral valve disease 07/15/2009   IRRITABLE BOWEL SYNDROME 07/15/2009   Palpitations 06/28/2009    PCP: Farris Has MD  REFERRING PROVIDER: Antoine Primas DO  REFERRING DIAG: F57.32 left lumbar radiculopathy  Rationale for Evaluation and Treatment: Rehabilitation  THERAPY DIAG:  Left lumbar radiculopathy; weakness  ONSET DATE: > 6 month  SUBJECTIVE:  SUBJECTIVE STATEMENT: My strength is diminished. The first of week of April having another PSA test.  The numbers are all over the place.  I need to avoid long car rides, vigorous exercise and the bike prior to that.  Had a lumbar ESI in December and it helped a whole lot.  Still with left back and left LE symptoms but lower in intensity.  Sleeps on sofa with back support on side (needs support), bed is uncomfortable. 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 Yes NPRS scale: 2/10 Pain location: bottom of left foot numbness, lower leg, thigh, left low back Pain orientation: Left  PAIN TYPE: aching Pain description: constant  Aggravating factors: lifting something 40 count of water  so I don't buy anymore 30#; reaching left arm overhead (in closet to high shelf raising up on toes); bending over; sitting > 30 min Relieving factors: shot helped 90%; walking  PRECAUTIONS: None    WEIGHT BEARING RESTRICTIONS: No  FALLS:  Has patient fallen in last 6 months? No  LIVING ENVIRONMENT: Lives with: lives with their family Lives in: House/apartment   PLOF: Independent with basic ADLs  PATIENT GOALS: properly and safely lift 20+#; get stronger; exercise and walk more   OBJECTIVE:  Note: Objective measures were completed at Evaluation unless otherwise noted.  DIAGNOSTIC FINDINGS:  Nov 2024 IMPRESSION: 1. L4-5: Disc degeneration with shallow broad-based protrusion. Mild narrowing of both lateral recesses. Moderate bilateral foraminal encroachment. No visible change since 2023. 2. L5-S1: Disc degeneration with endplate osteophytes and shallow disc protrusion. Moderate bilateral foraminal narrowing. Either L5 nerve could possibly be affected. No apparent change since last year. PATIENT SURVEYS:  Modified Oswestry 38%   COGNITION: Overall cognitive status: Within functional limits for tasks assessed      POSTURE: decreased lumbar lordosis   LUMBAR ROM: able to do partial squat cautiously to pick up small object from the floor  AROM eval  Flexion 25% limited  Extension 10 degrees  Right lateral flexion 20 degrees painful  Left lateral flexion 20 degrees  Right rotation   Left rotation    (Blank rows = not tested)  TRUNK STRENGTH:  Decreased activation of transverse abdominus muscles; abdominals 4-/5; decreased activation of lumbar multifidi; trunk extensors 4-/5; quadruped LE lift with moderate difficulty and increased pain, mild pain with UE lift  LOWER EXTREMITY ROM:   neural tension with terminal knee extension on left in sitting  LOWER EXTREMITY MMT:  right hip abduction 4-/5, left hip abduction 3+/5, right hip extension 4/5, left hip extension  4-/5  TRUNK STRENGTH:  Decreased activation of transverse abdominus muscles; abdominals 4-/5; decreased activation of lumbar multifidi; trunk extensors 4-/5    FUNCTIONAL TESTS:  5x sit to stand 14.96 no  SLS on right 15 sec, left with lateral trunk lean 5 sec  LUMBAR SPECIAL TESTS:  + slump test on left   GAIT: Comments: WNLs  TREATMENT DATE: 07/12/23 evaluation   Pt concerned about the effect of exercise with upcoming PSA test, will hold PT start date until the first week of April   Initial HEP as below  PATIENT EDUCATION:  Education details: Educated patient on anatomy and physiology of current symptoms, prognosis, plan of care as well as initial self care strategies to promote recovery  Person educated: Patient Education method: Explanation Education comprehension: verbalized understanding  HOME EXERCISE PROGRAM: Access Code: [email or web address] URL: [email or web address] Date: 07/12/2023 Prepared by: Lavinia Sharps  Exercises - Sit to Stand with Arms Crossed  - 2 x daily - 7 x weekly - 2 sets - 5 reps - Standing Hip Abduction  - 2 x daily - 7 x weekly - 1 sets - 10 reps - Standing Hip Extension  - 2 x daily - 7 x weekly - 1 sets - 10 reps - Wall Push Up  - 1 x daily - 7 x weekly - 1 sets - 10 reps  ASSESSMENT:  CLINICAL IMPRESSION: Patient is a 63 y.o. male who was seen today for physical therapy evaluation and treatment for left lumbar radiculopathy. He has eliminated lifting particularly cases of bottled water and is painful with reaching overhead, bending and sitting as well.  Due to this ongoing back issue over several months/years in addition to his diagnosis of prostate cancer he has become deconditioned with loss of strength. The patient would benefit from PT to address trunk and hip range of motion deficits, strength asymmetries in  lumbo/pelvic and hip regions and pain levels that are currently affecting activities of daily living at home including sitting, standing, sleeping, and lifting.     OBJECTIVE IMPAIRMENTS: decreased activity tolerance, decreased mobility, decreased strength, impaired perceived functional ability, and pain.   ACTIVITY LIMITATIONS: carrying, lifting, bending, sitting, squatting, sleeping, reach over head, and caring for others  PARTICIPATION LIMITATIONS: meal prep, interpersonal relationship, shopping, and community activity  PERSONAL FACTORS: Time since onset of injury/illness/exacerbation and 1-2 comorbidities: prostate cancer, chronic migraines  are also affecting patient's functional outcome.   REHAB POTENTIAL: Good  CLINICAL DECISION MAKING: Stable/uncomplicated  EVALUATION COMPLEXITY: Low   GOALS: Goals reviewed with patient? Yes  SHORT TERM GOALS: Target date: 08/16/2023   The patient will demonstrate knowledge of basic self care strategies and exercises to promote healing including  Baseline:  Goal status: INITIAL   2.   The patient will report a 30% improvement in pain levels with functional activities which are currently difficult including lifting, bending, standing  Baseline:  Goal status: INITIAL     3.  Patient will demonstrate good hip hinge and squat technique for light lifting Baseline:  Goal status: INITIAL   4.  5x sit to stand time decreased to < 14 sec  Baseline:  Goal status: INITIAL  LONG TERM GOALS: Target date: 09/20/2023   The patient will be independent in a safe self progression of a home exercise program and/or ex class to promote further recovery of function  Baseline:  Goal status: INITIAL   2.  The patient will report a 60% improvement in pain levels with functional activities which are currently difficult including lifting, bending, standing  Baseline:  Goal status: INITIAL   3.  Improved upper and lower body strength needed to kneel to  the floor and get back up with ease Baseline:  Goal status: INITIAL   4.  The patient will have hip and core strength needed for lifting 20# Baseline:  Goal status: INITIAL    5.  Modified Oswestry Index improved to 28% indicating improved function with less back pain Baseline:  Goal status: INITIAL PLAN:  PT FREQUENCY: 2x/week  PT DURATION: 8 weeks  PLANNED INTERVENTIONS:  04540- PT Re-evaluation, 97110-Therapeutic exercises, 97530- Therapeutic activity, [id number]- Neuromuscular re-education, 97535- Self Care, 98119- Manual therapy, [phone]- Aquatic Therapy, [phone]- Electrical stimulation (unattended), [id number]- Electrical stimulation (manual), [id number]- Ultrasound, [id number]- Traction (mechanical), [id number]- Ionotophoresis 4mg /ml Dexamethasone, Patient/Family education, Taping, Dry Needling, Joint manipulation, Spinal mobilization, Cryotherapy, and Moist heat.  PLAN FOR NEXT SESSION: initiate PT after PSA testing; graded exposure to exercise with emphasis on trunk and LE strengthening  Lavinia Sharps, PT 07/12/23 11:37 AM Phone: [phone] Fax: [phone]

## 2023-07-22 DIAGNOSIS — C61 Malignant neoplasm of prostate: Secondary | ICD-10-CM | POA: Diagnosis not present

## 2023-07-24 DIAGNOSIS — N401 Enlarged prostate with lower urinary tract symptoms: Secondary | ICD-10-CM | POA: Diagnosis not present

## 2023-07-24 DIAGNOSIS — N3941 Urge incontinence: Secondary | ICD-10-CM | POA: Diagnosis not present

## 2023-07-24 DIAGNOSIS — Z8546 Personal history of malignant neoplasm of prostate: Secondary | ICD-10-CM | POA: Diagnosis not present

## 2023-07-25 ENCOUNTER — Ambulatory Visit: Admitting: Physical Therapy

## 2023-07-26 ENCOUNTER — Encounter: Payer: Self-pay | Admitting: Family Medicine

## 2023-08-06 ENCOUNTER — Telehealth: Payer: Self-pay | Admitting: Physical Therapy

## 2023-08-06 ENCOUNTER — Ambulatory Visit: Attending: Family Medicine | Admitting: Physical Therapy

## 2023-08-06 DIAGNOSIS — M6281 Muscle weakness (generalized): Secondary | ICD-10-CM | POA: Insufficient documentation

## 2023-08-06 DIAGNOSIS — M5416 Radiculopathy, lumbar region: Secondary | ICD-10-CM | POA: Insufficient documentation

## 2023-08-06 NOTE — Telephone Encounter (Signed)
 Pt called and cancelled yesterday via MyChart.  Called him regarding missed appt and it appears his Friday 4/11 appt was cancelled instead of today (his allergies from the pollen are bothering him)

## 2023-08-08 DIAGNOSIS — E785 Hyperlipidemia, unspecified: Secondary | ICD-10-CM | POA: Diagnosis not present

## 2023-08-08 DIAGNOSIS — J309 Allergic rhinitis, unspecified: Secondary | ICD-10-CM | POA: Diagnosis not present

## 2023-08-08 DIAGNOSIS — C61 Malignant neoplasm of prostate: Secondary | ICD-10-CM | POA: Diagnosis not present

## 2023-08-09 ENCOUNTER — Encounter: Admitting: Physical Therapy

## 2023-08-14 ENCOUNTER — Ambulatory Visit

## 2023-08-20 ENCOUNTER — Encounter: Admitting: Physical Therapy

## 2023-08-22 NOTE — Progress Notes (Deleted)
 [facility] [address] [place] [address] Phone: [phone] Subjective:    I'm seeing this patient by the request  of:  Ronna Coho, MD  CC:   [id number]  07/08/2023 Given prescription for amoxicillin  500 mg to take 2 g before any dental cleaning procedures.   B12 injection given today   Continue to follow-up with his urologist.  Patient is scheduled to see him again in April.  Will send recheck the PSA.  Patient is still concerned enough that it may be for the anxiety we should consider the possibility of a PET scan.     Known arthritic changes continue to monitor discussed icing regimen and home exercises.  Increase activity slowly.  No other significant changes though at this time.     Discussed potentially continuing to work with physical therapy.  Continue the home exercises on his own.  Will continue to monitor.  Discussed multiple different treatment options again.  Follow-up again in 6 to 8 weeks otherwise.     Update 08/27/2023 William [name] is a 63 y.o. male coming in with complaint of back pain. Patient states     Past Medical History:  Diagnosis Date   Allergy    Anxiety    Chronic kidney disease    Depression    Diverticulitis    Gastroparesis    Heart murmur    Migraine headache    Mitral valve prolapse    SVT (supraventricular tachycardia) (HCC)    Past Surgical History:  Procedure Laterality Date   COLONOSCOPY W/ BIOPSIES  2023   RADIOACTIVE SEED IMPLANT N/A 01/19/2022   Procedure: RADIOACTIVE SEED IMPLANT/BRACHYTHERAPY IMPLANT;  Surgeon: Homero Luster, MD;  Location: [facility] ([place]  Service: Urology;  Laterality: N/A;   SPACE OAR INSTILLATION N/A 01/19/2022   Procedure: SPACE OAR INSTILLATION;  Surgeon: Homero Luster, MD;  Location: [facility];  Service: Urology;  Laterality: N/A;   Social History   Socioeconomic History   Marital status: Married    Spouse name: Not on  file   Number of children: 0   Years of education: Not on file   Highest education level: Bachelor's degree (e.g., BA, AB, BS)  Occupational History   Not on file  Tobacco Use   Smoking status: Never   Smokeless tobacco: Never  Vaping Use   Vaping status: Never Used  Substance and Sexual Activity   Alcohol use: No    Alcohol/week: 0.0 standard drinks of alcohol   Drug use: No   Sexual activity: Yes    Partners: Female  Other Topics Concern   Not on file  Social History Narrative   Pt is married lives with spouse in 1 story apartment he has No children   BS degree- he is right handed   Drinks soda  Ginger ale, no coffee no tea   Right handed   Social Drivers of Corporate investment banker Strain: Low Risk  (10/25/2021)   Overall Financial Resource Strain (CARDIA)    Difficulty of Paying Living Expenses: Not hard at all  Food Insecurity: No Food Insecurity (10/25/2021)   Hunger Vital Sign    Worried About Running Out of Food in the Last Year: Never true    Ran Out of Food in the Last Year: Never true  Transportation Needs: No Transportation Needs (10/25/2021)   PRAPARE - Administrator, Civil Service (Medical): No    Lack of Transportation (Non-Medical): No  Physical Activity: Not on file  Stress: Stress Concern Present (10/25/2021)   [employer] of Occupational Health - Occupational Stress Questionnaire    Feeling of Stress : Rather much  Social Connections: Not on file   Allergies  Allergen Reactions   Erythromycin Anaphylaxis and Nausea And Vomiting   Nalbuphine Other (See Comments)    States loss of consciousness    Cefdinir  Diarrhea, Nausea And Vomiting and Rash   Cephalexin Diarrhea and Nausea And Vomiting   Cephalosporins Diarrhea and Nausea And Vomiting   Hydrocodone -Acetaminophen  Nausea And Vomiting   Ketorolac  Nausea And Vomiting    Other reaction(s): upset stomach   Prochlorperazine Edisylate Other (See Comments)    uncontrollable shake -  tremor    Restasis  [Cyclosporine ] Other (See Comments)    migraines   Tyloxapol Swelling   Bacitracin-Polymyxin B     Other reaction(s): rash gets worse Other reaction(s): rash gets worse   Codeine  Other (See Comments)    Other reaction(s): stomach upset   Nalbuphine Hcl     Other reaction(s): stomach upset   Prochlorperazine     Other reaction(s): tremors/vomiting   Propranolol  Diarrhea   Rosuvastatin  Diarrhea   Topamax [Topiramate] Diarrhea and Nausea And Vomiting   Zetia  [Ezetimibe ] Diarrhea   Cefdinir  Diarrhea, Nausea And Vomiting and Rash    Other reaction(s): too stronge   Doxycycline Hyclate Nausea Only and Other (See Comments)    abd pain   Iodinated Contrast Media Other (See Comments)    Lightheaded, flushed feeling (explained to patient these are normal side effects of IV iodinated contrast, not allergies, but he wanted this noted in the epic; he tolerated epidural steroid injections w/ contrast w/o difficulty)   Ketoconazole  Itching   Ketorolac  Tromethamine  Rash    REACTION: Reaction not known   Latex Itching and Rash   Neosporin [Neomycin-Bacitracin Zn-Polymyx] Rash   Oxycodone -Acetaminophen  Other (See Comments)    Tylox caused constipation   Prednisone  Nausea And Vomiting    Oral route, only Other reaction(s): stomach spasms   Family History  Problem Relation Age of Onset   Hypertension Mother    Lung cancer Father 24 - 57   Hypertension Brother    Kidney failure Maternal Grandmother    Prostate cancer Neg Hx    Kidney cancer Neg Hx      Current Outpatient Medications (Cardiovascular):    diltiazem  (CARDIZEM  CD) 120 MG 24 hr capsule, Take 1 capsule (120 mg total) by mouth daily.   diltiazem  (CARDIZEM ) 30 MG tablet, TAKE (1) TABLET AS NEEDED FOR PALPITATIONS   rosuvastatin  (CRESTOR ) 10 MG tablet, Take 1 tablet (10 mg total) by mouth daily.  Current Outpatient Medications (Respiratory):    azelastine  (ASTELIN ) 0.1 % nasal spray, 1-2 puffs in each nostril  Nasally Twice a day   benzonatate  (TESSALON ) 100 MG capsule, Take 100-200 mg by mouth 3 (three) times daily as needed.   fluticasone  (FLONASE ) 50 MCG/ACT nasal spray, Place 2 sprays into both nostrils daily.   promethazine  (PHENERGAN ) 25 MG tablet, Take 25 mg by mouth every 4 (four) hours as needed for nausea.  Current Outpatient Medications (Analgesics):    aspirin  EC 81 MG tablet, Take 1 tablet (81 mg total) by mouth daily. Swallow whole.   Galcanezumab -gnlm (EMGALITY ) 120 MG/ML SOAJ, Inject 120 mg as directed every 28 (twenty-eight) days. INJECT 120MG  (1 PEN) UNDER THE SKIN EVERY MONTH   Rimegepant Sulfate (NURTEC) 75 MG TBDP, Take 1 tablet (75 mg total) by mouth as needed for up  to 1 dose (Maximum 1 tablet in 24 hours.).   Current Outpatient Medications (Other):    amoxicillin  (AMOXIL ) 500 MG tablet, Take 1 tablet (500 mg total) by mouth 2 (two) times daily.   baclofen  (LIORESAL ) 10 MG tablet, Take 0.5 tablets (5 mg total) by mouth at bedtime as needed for muscle spasms.   clobetasol  (TEMOVATE ) 0.05 % external solution, Apply 1 Application topically 2 (two) times daily. Apply to scalp x 2 weeks prn itch   clobetasol  cream (TEMOVATE ) 0.05 %, Apply 1 Application topically 2 (two) times daily. To hands and feet x 2 weeks prn   hydrocortisone (ANUSOL-HC) 2.5 % rectal cream, SMARTSIG:Rectally 3 Times Daily PRN   hydrOXYzine (ATARAX) 50 MG tablet, Take 25-50 mg by mouth 2 (two) times daily as needed for itching.   metoCLOPramide  (REGLAN ) 5 MG tablet, Take 5 mg by mouth as needed.   ondansetron  (ZOFRAN -ODT) 8 MG disintegrating tablet, Take 0.5 tablets (4 mg total) by mouth every 8 (eight) hours as needed for nausea.   polyethylene glycol powder (GLYCOLAX /MIRALAX ) 17 GM/SCOOP powder, Take 17 g by mouth as needed for moderate constipation.   silodosin (RAPAFLO) 8 MG CAPS capsule, Take 8 mg by mouth daily with breakfast.   traZODone  (DESYREL ) 50 MG tablet, Take 1 tablet (50 mg total) by mouth at  bedtime as needed for sleep.   Reviewed prior external information including notes and imaging from  primary care provider As well as notes that were available from care everywhere and other healthcare systems.  Past medical history, social, surgical and family history all reviewed in electronic medical record.  No pertanent information unless stated regarding to the chief complaint.   Review of Systems:  No headache, visual changes, nausea, vomiting, diarrhea, constipation, dizziness, abdominal pain, skin rash, fevers, chills, night sweats, weight loss, swollen lymph nodes, body aches, joint swelling, chest pain, shortness of breath, mood changes. POSITIVE muscle aches  Objective  There were no vitals taken for this visit.   General: No apparent distress alert and oriented x3 mood and affect normal, dressed appropriately.  HEENT: Pupils equal, extraocular movements intact  Respiratory: Patient's speak in full sentences and does not appear short of breath  Cardiovascular: No lower extremity edema, non tender, no erythema      Impression and Recommendations:

## 2023-08-23 ENCOUNTER — Encounter

## 2023-08-27 ENCOUNTER — Ambulatory Visit: Admitting: Family Medicine

## 2023-08-27 ENCOUNTER — Telehealth: Payer: Self-pay | Admitting: Physical Therapy

## 2023-08-27 NOTE — Telephone Encounter (Signed)
 Patient left message requesting a call from the therapist. Attempted to call back at 9:09( left voicemail) and 11:26.

## 2023-08-28 ENCOUNTER — Telehealth: Payer: Self-pay | Admitting: Physical Therapy

## 2023-08-28 ENCOUNTER — Encounter: Admitting: Physical Therapy

## 2023-08-28 NOTE — Telephone Encounter (Signed)
 Spoke on the phone with William [name] who reports profound fatigue affecting his ability to function and take care of his mother.  He is seeing the doctor tomorrow. We decided to cancel his PT appt on Friday. He will call back if he needs to hold PT or change his appts for next week.

## 2023-08-29 DIAGNOSIS — R5383 Other fatigue: Secondary | ICD-10-CM | POA: Diagnosis not present

## 2023-08-29 DIAGNOSIS — E559 Vitamin D deficiency, unspecified: Secondary | ICD-10-CM | POA: Diagnosis not present

## 2023-08-29 DIAGNOSIS — R42 Dizziness and giddiness: Secondary | ICD-10-CM | POA: Diagnosis not present

## 2023-08-29 DIAGNOSIS — C61 Malignant neoplasm of prostate: Secondary | ICD-10-CM | POA: Diagnosis not present

## 2023-08-30 ENCOUNTER — Ambulatory Visit: Admitting: Physical Therapy

## 2023-09-02 DIAGNOSIS — E785 Hyperlipidemia, unspecified: Secondary | ICD-10-CM | POA: Diagnosis not present

## 2023-09-02 DIAGNOSIS — E559 Vitamin D deficiency, unspecified: Secondary | ICD-10-CM | POA: Diagnosis not present

## 2023-09-02 DIAGNOSIS — K3184 Gastroparesis: Secondary | ICD-10-CM | POA: Diagnosis not present

## 2023-09-02 DIAGNOSIS — Z713 Dietary counseling and surveillance: Secondary | ICD-10-CM | POA: Diagnosis not present

## 2023-09-03 ENCOUNTER — Ambulatory Visit: Admitting: Physical Therapy

## 2023-09-06 ENCOUNTER — Ambulatory Visit: Admitting: Physical Therapy

## 2023-09-10 ENCOUNTER — Encounter: Payer: Self-pay | Admitting: Family Medicine

## 2023-09-10 ENCOUNTER — Ambulatory Visit

## 2023-09-10 ENCOUNTER — Ambulatory Visit: Admitting: Family Medicine

## 2023-09-10 VITALS — BP 116/64 | HR 89 | Ht 70.0 in | Wt 148.0 lb

## 2023-09-10 DIAGNOSIS — C61 Malignant neoplasm of prostate: Secondary | ICD-10-CM

## 2023-09-10 DIAGNOSIS — M6289 Other specified disorders of muscle: Secondary | ICD-10-CM

## 2023-09-10 DIAGNOSIS — M5416 Radiculopathy, lumbar region: Secondary | ICD-10-CM

## 2023-09-10 DIAGNOSIS — E538 Deficiency of other specified B group vitamins: Secondary | ICD-10-CM | POA: Diagnosis not present

## 2023-09-10 DIAGNOSIS — Z636 Dependent relative needing care at home: Secondary | ICD-10-CM

## 2023-09-10 MED ORDER — CYANOCOBALAMIN 1000 MCG/ML IJ SOLN
1000.0000 ug | Freq: Once | INTRAMUSCULAR | Status: AC
  Administered 2023-09-10: 1000 ug via INTRAMUSCULAR

## 2023-09-10 NOTE — Assessment & Plan Note (Signed)
 Still working with physical therapy.  Making some changes.  Would like patient to continue to increase activity as well.  Discussed icing regimen and home exercises, which activities to do and which ones to avoid.  Increase activity slowly.  Follow-up again in 6 to 8 weeks

## 2023-09-10 NOTE — Assessment & Plan Note (Signed)
 Downtrending PSA

## 2023-09-10 NOTE — Progress Notes (Signed)
 [facility] [address] [place] [address] Phone: [phone] Subjective:   IBryan Caprio, am serving as a scribe for Dr. Ronnell Coins.  I'm seeing this patient by the request  of:  Ronna Coho, MD  CC: All over pain still and fatigue  [id number]  Ezana Javontez Bacani is a 63 y.o. male coming in with complaint of extreme fatigue. Navigating B12 shots and Vit D.       Past Medical History:  Diagnosis Date   Allergy    Anxiety    Chronic kidney disease    Depression    Diverticulitis    Gastroparesis    Heart murmur    Migraine headache    Mitral valve prolapse    SVT (supraventricular tachycardia) (HCC)    Past Surgical History:  Procedure Laterality Date   COLONOSCOPY W/ BIOPSIES  2023   RADIOACTIVE SEED IMPLANT N/A 01/19/2022   Procedure: RADIOACTIVE SEED IMPLANT/BRACHYTHERAPY IMPLANT;  Surgeon: Homero Luster, MD;  Location: [facility];  Service: Urology;  Laterality: N/A;   SPACE OAR INSTILLATION N/A 01/19/2022   Procedure: SPACE OAR INSTILLATION;  Surgeon: Homero Luster, MD;  Location: [facility];  Service: Urology;  Laterality: N/A;   Social History   Socioeconomic History   Marital status: Married    Spouse name: Not on file   Number of children: 0   Years of education: Not on file   Highest education level: Bachelor's degree (e.g., BA, AB, BS)  Occupational History   Not on file  Tobacco Use   Smoking status: Never   Smokeless tobacco: Never  Vaping Use   Vaping status: Never Used  Substance and Sexual Activity   Alcohol use: No    Alcohol/week: 0.0 standard drinks of alcohol   Drug use: No   Sexual activity: Yes    Partners: Female  Other Topics Concern   Not on file  Social History Narrative   Pt is married lives with spouse in 1 story apartment he has No children   BS degree- he is right handed   Drinks soda  Ginger ale, no coffee no tea   Right handed   Social  Drivers of Corporate investment banker Strain: Low Risk  (10/25/2021)   Overall Financial Resource Strain (CARDIA)    Difficulty of Paying Living Expenses: Not hard at all  Food Insecurity: No Food Insecurity (10/25/2021)   Hunger Vital Sign    Worried About Running Out of Food in the Last Year: Never true    Ran Out of Food in the Last Year: Never true  Transportation Needs: No Transportation Needs (10/25/2021)   PRAPARE - Administrator, Civil Service (Medical): No    Lack of Transportation (Non-Medical): No  Physical Activity: Not on file  Stress: Stress Concern Present (10/25/2021)   [employer] of Occupational Health - Occupational Stress Questionnaire    Feeling of Stress : Rather much  Social Connections: Not on file   Allergies  Allergen Reactions   Erythromycin Anaphylaxis and Nausea And Vomiting   Nalbuphine Other (See Comments)    States loss of consciousness    Cefdinir  Diarrhea, Nausea And Vomiting and Rash   Cephalexin Diarrhea and Nausea And Vomiting   Cephalosporins Diarrhea and Nausea And Vomiting   Hydrocodone -Acetaminophen  Nausea And Vomiting   Ketorolac  Nausea And Vomiting    Other reaction(s): upset stomach   Prochlorperazine Edisylate Other (See Comments)    uncontrollable  shake - tremor    Restasis  [Cyclosporine ] Other (See Comments)    migraines   Tyloxapol Swelling   Bacitracin-Polymyxin B     Other reaction(s): rash gets worse Other reaction(s): rash gets worse   Codeine  Other (See Comments)    Other reaction(s): stomach upset   Nalbuphine Hcl     Other reaction(s): stomach upset   Prochlorperazine     Other reaction(s): tremors/vomiting   Propranolol  Diarrhea   Rosuvastatin  Diarrhea   Topamax [Topiramate] Diarrhea and Nausea And Vomiting   Zetia  [Ezetimibe ] Diarrhea   Cefdinir  Diarrhea, Nausea And Vomiting and Rash    Other reaction(s): too stronge   Doxycycline Hyclate Nausea Only and Other (See Comments)    abd pain    Iodinated Contrast Media Other (See Comments)    Lightheaded, flushed feeling (explained to patient these are normal side effects of IV iodinated contrast, not allergies, but he wanted this noted in the epic; he tolerated epidural steroid injections w/ contrast w/o difficulty)   Ketoconazole  Itching   Ketorolac  Tromethamine  Rash    REACTION: Reaction not known   Latex Itching and Rash   Neosporin [Neomycin-Bacitracin Zn-Polymyx] Rash   Oxycodone -Acetaminophen  Other (See Comments)    Tylox caused constipation   Prednisone  Nausea And Vomiting    Oral route, only Other reaction(s): stomach spasms   Family History  Problem Relation Age of Onset   Hypertension Mother    Lung cancer Father 38 - 105   Hypertension Brother    Kidney failure Maternal Grandmother    Prostate cancer Neg Hx    Kidney cancer Neg Hx      Current Outpatient Medications (Cardiovascular):    diltiazem  (CARDIZEM  CD) 120 MG 24 hr capsule, Take 1 capsule (120 mg total) by mouth daily.   diltiazem  (CARDIZEM ) 30 MG tablet, TAKE (1) TABLET AS NEEDED FOR PALPITATIONS   rosuvastatin  (CRESTOR ) 10 MG tablet, Take 1 tablet (10 mg total) by mouth daily.  Current Outpatient Medications (Respiratory):    azelastine  (ASTELIN ) 0.1 % nasal spray, 1-2 puffs in each nostril Nasally Twice a day   benzonatate  (TESSALON ) 100 MG capsule, Take 100-200 mg by mouth 3 (three) times daily as needed.   fluticasone  (FLONASE ) 50 MCG/ACT nasal spray, Place 2 sprays into both nostrils daily.   promethazine  (PHENERGAN ) 25 MG tablet, Take 25 mg by mouth every 4 (four) hours as needed for nausea.  Current Outpatient Medications (Analgesics):    aspirin  EC 81 MG tablet, Take 1 tablet (81 mg total) by mouth daily. Swallow whole.   Galcanezumab -gnlm (EMGALITY ) 120 MG/ML SOAJ, Inject 120 mg as directed every 28 (twenty-eight) days. INJECT 120MG  (1 PEN) UNDER THE SKIN EVERY MONTH   Rimegepant Sulfate (NURTEC) 75 MG TBDP, Take 1 tablet (75 mg total) by  mouth as needed for up to 1 dose (Maximum 1 tablet in 24 hours.).   Current Outpatient Medications (Other):    amoxicillin  (AMOXIL ) 500 MG tablet, Take 1 tablet (500 mg total) by mouth 2 (two) times daily.   baclofen  (LIORESAL ) 10 MG tablet, Take 0.5 tablets (5 mg total) by mouth at bedtime as needed for muscle spasms.   clobetasol  (TEMOVATE ) 0.05 % external solution, Apply 1 Application topically 2 (two) times daily. Apply to scalp x 2 weeks prn itch   clobetasol  cream (TEMOVATE ) 0.05 %, Apply 1 Application topically 2 (two) times daily. To hands and feet x 2 weeks prn   hydrocortisone (ANUSOL-HC) 2.5 % rectal cream, SMARTSIG:Rectally 3 Times Daily PRN   hydrOXYzine (  ATARAX) 50 MG tablet, Take 25-50 mg by mouth 2 (two) times daily as needed for itching.   metoCLOPramide  (REGLAN ) 5 MG tablet, Take 5 mg by mouth as needed.   ondansetron  (ZOFRAN -ODT) 8 MG disintegrating tablet, Take 0.5 tablets (4 mg total) by mouth every 8 (eight) hours as needed for nausea.   polyethylene glycol powder (GLYCOLAX /MIRALAX ) 17 GM/SCOOP powder, Take 17 g by mouth as needed for moderate constipation.   silodosin (RAPAFLO) 8 MG CAPS capsule, Take 8 mg by mouth daily with breakfast.   traZODone  (DESYREL ) 50 MG tablet, Take 1 tablet (50 mg total) by mouth at bedtime as needed for sleep.   Reviewed prior external information including notes and imaging from  primary care provider As well as notes that were available from care everywhere and other healthcare systems.  Past medical history, social, surgical and family history all reviewed in electronic medical record.  No pertanent information unless stated regarding to the chief complaint.   Review of Systems:  No headache, visual changes, nausea, vomiting, diarrhea, constipation, dizziness, abdominal pain, skin rash, fevers, chills, night sweats, weight loss, swollen lymph nodes, body aches, joint swelling, chest pain, shortness of breath, mood changes. POSITIVE  muscle aches  Objective  Blood pressure 116/64, pulse 89, height 5\' 10"  (1.778 m), weight 148 lb (67.1 kg), SpO2 98%.   General: No apparent distress alert and oriented x3 mood and affect normal, dressed appropriately.  HEENT: Pupils equal, extraocular movements intact  Respiratory: Patient's speak in full sentences and does not appear short of breath  Cardiovascular: No lower extremity edema, non tender, no erythema  Patient is sitting relatively comfortably.    Impression and Recommendations:     The above documentation has been reviewed and is accurate and complete Kelbi Renstrom M Sriansh Farra, DO

## 2023-09-10 NOTE — Assessment & Plan Note (Signed)
 B12 injection given today.  Will follow-up again in 2 to 3 months will come into the office to monthly for the injections

## 2023-09-10 NOTE — Assessment & Plan Note (Signed)
 Likely no significant radicular symptoms.  Continue with the conservative therapy.  Continue to work with physical therapy.  No change in medications at the moment.  Try to increase activity.  Follow-up again in 6 weeks

## 2023-09-10 NOTE — Assessment & Plan Note (Signed)
 Still encouraged seeing behavioral health

## 2023-09-10 NOTE — Patient Instructions (Addendum)
 Choline 500mg  Overall doing well B12 injection today Start that again monthly Continue once weekly Vit D Annual wellness  See me in 2-3 months

## 2023-09-11 DIAGNOSIS — Z0000 Encounter for general adult medical examination without abnormal findings: Secondary | ICD-10-CM | POA: Diagnosis not present

## 2023-09-11 DIAGNOSIS — E785 Hyperlipidemia, unspecified: Secondary | ICD-10-CM | POA: Diagnosis not present

## 2023-09-13 ENCOUNTER — Ambulatory Visit: Attending: Family Medicine | Admitting: Physical Therapy

## 2023-09-13 DIAGNOSIS — M5416 Radiculopathy, lumbar region: Secondary | ICD-10-CM | POA: Diagnosis not present

## 2023-09-13 DIAGNOSIS — M6281 Muscle weakness (generalized): Secondary | ICD-10-CM | POA: Diagnosis not present

## 2023-09-13 NOTE — Therapy (Signed)
 OUTPATIENT PHYSICAL THERAPY THORACOLUMBAR PROGRESS NOTE/RECERTIFICATION   Patient Name: William [name] MRN: [id number] DOB:07/26/1960, 63 y.o., male Today's Date: 09/13/2023  END OF SESSION:  PT End of Session - 09/13/23 0928     Visit Number 2    Date for PT Re-Evaluation 11/08/23    Authorization Type [employer]    Progress Note Due on Visit 10    PT Start Time 0930    PT Stop Time 1013    PT Time Calculation (min) 43 min    Activity Tolerance Patient tolerated treatment well             Past Medical History:  Diagnosis Date   Allergy    Anxiety    Chronic kidney disease    Depression    Diverticulitis    Gastroparesis    Heart murmur    Migraine headache    Mitral valve prolapse    SVT (supraventricular tachycardia) (HCC)    Past Surgical History:  Procedure Laterality Date   COLONOSCOPY W/ BIOPSIES  2023   RADIOACTIVE SEED IMPLANT N/A 01/19/2022   Procedure: RADIOACTIVE SEED IMPLANT/BRACHYTHERAPY IMPLANT;  Surgeon: Homero Luster, MD;  Location: [facility];  Service: Urology;  Laterality: N/A;   SPACE OAR INSTILLATION N/A 01/19/2022   Procedure: SPACE OAR INSTILLATION;  Surgeon: Homero Luster, MD;  Location: [facility];  Service: Urology;  Laterality: N/A;   Patient Active Problem List   Diagnosis Date Noted   Depression 10/11/2022   B12 deficiency 06/08/2022   Loss of weight 06/08/2022   Genetic testing 11/10/2021   Malignant neoplasm of prostate (HCC) 10/24/2021   Right calf pain 09/18/2021   Caregiver burden 10/18/2020   Arthritis of right acromioclavicular joint 02/18/2020   Right rotator cuff tear 12/18/2019   Degenerative cervical disc 05/07/2019   Left lumbar radiculopathy 10/10/2017   Migraine without aura and without status migrainosus, not intractable 06/22/2017   Vitamin D  deficiency 05/28/2017   Scrotal pain 05/28/2017   Paroxysmal SVT (supraventricular tachycardia) (HCC) 05/28/2017   Mild left  inguinal hernia 11/23/2016   Chronic pelvic pain in male 09/28/2016   Abdominal pain, left lower quadrant 09/28/2016   Isolated proteinuria without specific morphologic lesion 06/27/2016   Left testicular pain 06/27/2016   Acute left flank pain 06/27/2016   Epididymitis, left 06/05/2016   Pelvic floor dysfunction 04/01/2016   GERD (gastroesophageal reflux disease) 10/13/2015   Gastroparesis 10/13/2015   Intractable chronic migraine without aura and without status migrainosus 12/15/2013   Hyperlipidemia 04/04/2012   Mitral valve disease 07/15/2009   IRRITABLE BOWEL SYNDROME 07/15/2009   Palpitations 06/28/2009    PCP: Ronna Coho MD  REFERRING PROVIDER: Ronnell Coins DO  REFERRING DIAG: W09.81 left lumbar radiculopathy  Rationale for Evaluation and Treatment: Rehabilitation  THERAPY DIAG:  Left lumbar radiculopathy; weakness  ONSET DATE: > 6 month  SUBJECTIVE:  SUBJECTIVE STATEMENT: I almost didn't come today still extremely fatigued.  I don't have the stamina to do anything.  The doctors were happy about the last PSA.  It's a lot to take care of my mom.  My back is better.  I did have an episode of back pain from lifting a few weeks ago but it got better.       EVAL: My strength is diminished. The first of week of April having another PSA test.  The numbers are all over the place.  I need to avoid long car rides, vigorous exercise and the bike prior to that.  Had a lumbar ESI in December and it helped a whole lot.  Still with left back and left LE symptoms but lower in intensity.  Sleeps on sofa with back support on side (needs support), bed is uncomfortable. 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 No NPRS scale: 0/10 Pain location: bottom of left foot numbness, lower leg, thigh, left low back Pain orientation: Left  PAIN TYPE: aching Pain description: constant  Aggravating factors: lifting something 40 count of water  so I don't buy anymore 30#; reaching left arm overhead (in closet to high shelf raising up on toes); bending over; sitting > 30 min Relieving factors: shot helped 90%; walking  PRECAUTIONS: None    WEIGHT BEARING RESTRICTIONS: No  FALLS:  Has patient fallen in last 6 months? No  LIVING ENVIRONMENT: Lives with: lives with their family Lives in: House/apartment   PLOF: Independent with basic ADLs  PATIENT GOALS: properly and safely lift 20+#; get stronger; exercise and walk more   OBJECTIVE:  Note: Objective measures were completed at Evaluation unless otherwise noted.  DIAGNOSTIC FINDINGS:  Nov 2024 IMPRESSION: 1. L4-5: Disc degeneration with shallow broad-based protrusion. Mild narrowing of both lateral recesses. Moderate bilateral foraminal encroachment. No visible change since 2023. 2. L5-S1: Disc degeneration with endplate osteophytes and shallow disc protrusion. Moderate bilateral foraminal narrowing. Either L5 nerve could possibly be affected. No apparent change since last year. PATIENT SURVEYS:  Modified Oswestry 38%  5/16: 34%  COGNITION: Overall cognitive status: Within functional limits for tasks assessed      POSTURE: decreased lumbar lordosis   LUMBAR ROM: able to do partial squat cautiously to pick up small object from the floor 5/16: able to do a cautious half squat  AROM eval 5/16  Flexion 25% limited 25% limited  Extension 10 degrees 10 degrees  Right lateral flexion 20 degrees painful 25 degrees  Left lateral flexion 20 degrees 15 degrees "feels tight"  Right rotation    Left rotation     (Blank rows = not tested)  TRUNK STRENGTH:   Decreased activation of transverse abdominus muscles; abdominals 4-/5; decreased activation of lumbar multifidi; trunk extensors 4-/5; quadruped LE lift with moderate difficulty and increased pain, mild pain with UE lift  LOWER EXTREMITY ROM:   neural tension with terminal knee extension on left in sitting  LOWER EXTREMITY MMT:  right hip abduction 4-/5, left hip abduction 3+/5, right hip extension 4/5, left hip extension 4-/5  TRUNK STRENGTH:  Decreased activation of transverse abdominus muscles; abdominals 4-/5; decreased activation of lumbar multifidi; trunk extensors 4-/5    FUNCTIONAL TESTS:  5x sit to stand 14.96 no  SLS on right 15 sec, left with lateral  trunk lean 5 sec  5/16: 18.85 sec no hands (out of breath)  LUMBAR SPECIAL TESTS:  + slump test on left   GAIT: Comments: WNLs   TREATMENT DATE: 09/13/23 Status update Patient education on the benefit of exercise to reduce fatigue, increase energy Modified Oswestry Index Lumbar ROM SLS Squat test Review of initial HEP including reprint        TREATMENT DATE: 07/12/23 evaluation   Pt concerned about the effect of exercise with upcoming PSA test, will hold PT start date until the first week of April   Initial HEP as below                                                                                                                             PATIENT EDUCATION:  Education details: Educated patient on anatomy and physiology of current symptoms, prognosis, plan of care as well as initial self care strategies to promote recovery  Person educated: Patient Education method: Explanation Education comprehension: verbalized understanding  HOME EXERCISE PROGRAM: Access Code: [email or web address] URL: [email or web address] Date: 07/12/2023 Prepared by: Darien Eden  Exercises - Sit to Stand with Arms Crossed  - 2 x daily - 7 x weekly - 2 sets - 5 reps - Standing Hip Abduction  - 2 x daily - 7 x weekly - 1 sets - 10  reps - Standing Hip Extension  - 2 x daily - 7 x weekly - 1 sets - 10 reps - Wall Push Up  - 1 x daily - 7 x weekly - 1 sets - 10 reps  ASSESSMENT:  CLINICAL IMPRESSION: William [name] returns for his 2nd visit with initial evaluation > 8 weeks ago.  He has cancelled prior appts due to extreme fatigue, migraines, or for reasons related to being the caregiver for his mother.  He reports his back pain and LE symptoms have improved although he did have an issue after lifting 2 weeks ago.  His Modified Oswestry indicates mild to moderate self perceived disability related to his back.  Decreased movement speed with 5x STS retest.  The patient would benefit from a continuation of skilled PT for a further progression of strengthening and functional mobility.  Will continue to update and promote independence in a HEP needed for a return to the highest functional level possible with ADLs.      OBJECTIVE IMPAIRMENTS: decreased activity tolerance, decreased mobility, decreased strength, impaired perceived functional ability, and pain.   ACTIVITY LIMITATIONS: carrying, lifting, bending, sitting, squatting, sleeping, reach over head, and caring for others  PARTICIPATION LIMITATIONS: meal prep, interpersonal relationship, shopping, and community activity  PERSONAL FACTORS: Time since onset of injury/illness/exacerbation and 1-2 comorbidities: prostate cancer, chronic migraines are also affecting patient's functional outcome.   REHAB POTENTIAL: Good  CLINICAL DECISION MAKING: Stable/uncomplicated  EVALUATION COMPLEXITY: Low   GOALS: Goals reviewed with patient? Yes  SHORT TERM GOALS: Target date: 10/11/2023   The patient will demonstrate knowledge of basic self care strategies  and exercises to promote healing including  Baseline:  Goal status: INITIAL   2.   The patient will report a 30% improvement in pain levels with functional activities which are currently difficult including lifting, bending, standing   Baseline:  Goal status: INITIAL     3.  Patient will demonstrate good hip hinge and squat technique for light lifting Baseline:  Goal status: INITIAL   4.  5x sit to stand time decreased to < 14 sec  Baseline:  Goal status: INITIAL  LONG TERM GOALS: Target date: 11/08/2023    The patient will be independent in a safe self progression of a home exercise program and/or ex class to promote further recovery of function  Baseline:  Goal status: INITIAL   2.  The patient will report a 60% improvement in pain levels with functional activities which are currently difficult including lifting, bending, standing  Baseline:  Goal status: INITIAL   3.  Improved upper and lower body strength needed to kneel to the floor and get back up with ease Baseline:  Goal status: INITIAL   4.  The patient will have hip and core strength needed for lifting 20# Baseline:  Goal status: INITIAL    5.  Modified Oswestry Index improved to 28% indicating improved function with less back pain Baseline:  Goal status: INITIAL PLAN:  PT FREQUENCY:   PT DURATION: 8 weeks  PLANNED INTERVENTIONS: 97164- PT Re-evaluation, 97110-Therapeutic exercises, 97530- Therapeutic activity, 97112- Neuromuscular re-education, 97535- Self Care, 01093- Manual therapy, [id number]- Aquatic Therapy, A3557- Electrical stimulation (unattended), [id number]- Electrical stimulation (manual), [id number]- Ultrasound, [id number]- Traction (mechanical), [id number]- Ionotophoresis 4mg /ml Dexamethasone , Patient/Family education, Taping, Dry Needling, Joint manipulation, Spinal mobilization, Cryotherapy, and Moist heat.  PLAN FOR NEXT SESSION:  graded exposure to exercise secondary to high fatigue levels with emphasis on trunk and LE strengthening  Darien Eden, PT 09/13/23 12:08 PM Phone: [phone] Fax: [phone]

## 2023-09-16 ENCOUNTER — Telehealth: Payer: Self-pay | Admitting: Family Medicine

## 2023-09-16 NOTE — Telephone Encounter (Signed)
 Patient wanted to let Dr Felipe [name] know that he is still very fatigued (even after the B12 injection last week).  He asked if there were other things that might would help?  - And appreciates the help with his mom!

## 2023-09-17 ENCOUNTER — Encounter: Payer: Self-pay | Admitting: Cardiovascular Disease

## 2023-09-17 ENCOUNTER — Ambulatory Visit: Admitting: Physical Therapy

## 2023-09-17 DIAGNOSIS — C61 Malignant neoplasm of prostate: Secondary | ICD-10-CM | POA: Diagnosis not present

## 2023-09-17 DIAGNOSIS — R070 Pain in throat: Secondary | ICD-10-CM | POA: Diagnosis not present

## 2023-09-17 DIAGNOSIS — I471 Supraventricular tachycardia, unspecified: Secondary | ICD-10-CM | POA: Diagnosis not present

## 2023-09-17 DIAGNOSIS — F411 Generalized anxiety disorder: Secondary | ICD-10-CM | POA: Diagnosis not present

## 2023-09-17 DIAGNOSIS — E559 Vitamin D deficiency, unspecified: Secondary | ICD-10-CM | POA: Diagnosis not present

## 2023-09-17 DIAGNOSIS — Z0000 Encounter for general adult medical examination without abnormal findings: Secondary | ICD-10-CM | POA: Diagnosis not present

## 2023-09-17 DIAGNOSIS — E785 Hyperlipidemia, unspecified: Secondary | ICD-10-CM | POA: Diagnosis not present

## 2023-09-17 DIAGNOSIS — Z789 Other specified health status: Secondary | ICD-10-CM

## 2023-09-20 ENCOUNTER — Ambulatory Visit

## 2023-09-27 ENCOUNTER — Ambulatory Visit: Admitting: Family Medicine

## 2023-09-30 DIAGNOSIS — C61 Malignant neoplasm of prostate: Secondary | ICD-10-CM | POA: Diagnosis not present

## 2023-09-30 DIAGNOSIS — M25511 Pain in right shoulder: Secondary | ICD-10-CM | POA: Diagnosis not present

## 2023-09-30 DIAGNOSIS — E785 Hyperlipidemia, unspecified: Secondary | ICD-10-CM | POA: Diagnosis not present

## 2023-10-10 ENCOUNTER — Other Ambulatory Visit: Payer: Self-pay

## 2023-10-10 ENCOUNTER — Telehealth: Payer: Self-pay | Admitting: Family Medicine

## 2023-10-10 DIAGNOSIS — M2550 Pain in unspecified joint: Secondary | ICD-10-CM

## 2023-10-10 NOTE — Progress Notes (Unsigned)
 William [name]

## 2023-10-10 NOTE — Telephone Encounter (Signed)
 Patient called and asked if he could have a PSA test done? And if someone could let him know if he could. Please advise.

## 2023-10-11 ENCOUNTER — Other Ambulatory Visit

## 2023-10-11 DIAGNOSIS — M2550 Pain in unspecified joint: Secondary | ICD-10-CM | POA: Diagnosis not present

## 2023-10-12 LAB — PSA: PSA: 1.12 ng/mL (ref <=4.00)

## 2023-10-13 ENCOUNTER — Ambulatory Visit: Payer: Self-pay | Admitting: Family Medicine

## 2023-10-15 ENCOUNTER — Ambulatory Visit

## 2023-10-18 ENCOUNTER — Ambulatory Visit: Admitting: Physical Therapy

## 2023-10-21 ENCOUNTER — Ambulatory Visit

## 2023-10-22 ENCOUNTER — Ambulatory Visit: Attending: Cardiovascular Disease | Admitting: Pharmacist

## 2023-10-22 ENCOUNTER — Other Ambulatory Visit (HOSPITAL_COMMUNITY): Payer: Self-pay

## 2023-10-22 ENCOUNTER — Encounter: Payer: Self-pay | Admitting: Pharmacist

## 2023-10-22 ENCOUNTER — Telehealth: Payer: Self-pay

## 2023-10-22 DIAGNOSIS — E782 Mixed hyperlipidemia: Secondary | ICD-10-CM

## 2023-10-22 NOTE — Assessment & Plan Note (Signed)
 Assessment: LDL-C above goal of <70 Did not tolerate rosuvastatin  10mg , atorvastatin  or ezetimibe  + CAC score- puts him at at 9.8% 10 year risk (intermediate risk) Discussed PCSK9i vs Nexletol  Plan: Patient would like to proceed with Repatha PA for Repatha Labs in 3 months

## 2023-10-22 NOTE — Telephone Encounter (Signed)
 Pharmacy Patient Advocate Encounter   Received notification from Physician's Office that prior authorization for REPATHA is required/requested.   Insurance verification completed.   The patient is insured through [facility] [employer] .   Per test claim: PA required; PA submitted to above mentioned insurance via CoverMyMeds Key/confirmation #/EOC [id number] Status is pending

## 2023-10-22 NOTE — Telephone Encounter (Signed)
-----   Message from Eleanor JONETTA Crews sent at 10/22/2023  1:16 PM EDT ----- Please do PA for Repatha - HLD- intolerant to atorvastatin  and rosuvastatin

## 2023-10-22 NOTE — Progress Notes (Signed)
 Patient ID: William [name]                 DOB: July 15, 1960                    MRN: [id number]      HPI: Griff Badley is a 63 y.o. male patient referred to lipid clinic by Dr. Verlin. PMH is significant for CAD (by calcium  score), palpitations, PVCs, PACs, SVT, migraine headaches, mitral valve prolapse and irritable bowel syndrome.   CT coronary calcium  score of 144 in May 2024. We started a statin but he stopped this due to painful urination. He has tried rosuvastatin  and atorvastatin  both causing stomach issues and fatigue.   He was diagnosed with prostate cancer in 2023. He underwent radiation and treatment. Having a hard time recovering.   Reviewed options for lowering LDL cholesterol, including PCSK-9 inhibitors, bempedoic acid or a different statin.  Discussed mechanisms of action, dosing, side effects and potential decreases in LDL cholesterol.  Also reviewed cost information and potential options for patient assistance.   Current Medications: none Intolerances: rosuvastatin  10mg  (diarrhea, lightheadedness), zetia  (diarrhea), atorvastatin  (stomach issues) Risk Factors: calcium  score. MESA 10 year risk 9.8% LDL-C goal: <70 ApoB goal: <80  Family History:  Family History  Problem Relation Age of Onset   Hypertension Mother    Lung cancer Father 51 - 56   Hypertension Brother    Kidney failure Maternal Grandmother    Prostate cancer Neg Hx    Kidney cancer Neg Hx     Social History:   Labs: Lipid Panel     Component Value Date/Time   CHOL 210 (H) 04/15/2023 0812   TRIG 95 04/15/2023 0812   HDL 60 04/15/2023 0812   CHOLHDL 3.5 04/15/2023 0812   CHOLHDL 3.7 05/14/2015 0958   VLDL 25 05/14/2015 0958   LDLCALC 133 (H) 04/15/2023 0812   LABVLDL 17 04/15/2023 9187    Past Medical History:  Diagnosis Date   Allergy    Anxiety    Chronic kidney disease    Depression    Diverticulitis    Gastroparesis    Heart murmur    Migraine headache    Mitral  valve prolapse    SVT (supraventricular tachycardia) (HCC)     Current Outpatient Medications on File Prior to Visit  Medication Sig Dispense Refill   diltiazem  (CARDIZEM  CD) 120 MG 24 hr capsule Take 1 capsule (120 mg total) by mouth daily. 90 capsule 3   diltiazem  (CARDIZEM ) 30 MG tablet TAKE (1) TABLET AS NEEDED FOR PALPITATIONS 30 tablet 6   fluticasone  (FLONASE ) 50 MCG/ACT nasal spray Place 2 sprays into both nostrils daily. 16 g 6   Galcanezumab -gnlm (EMGALITY ) 120 MG/ML SOAJ Inject 120 mg as directed every 28 (twenty-eight) days. INJECT 120MG  (1 PEN) UNDER THE SKIN EVERY MONTH 3 mL 2   hydrocortisone (ANUSOL-HC) 2.5 % rectal cream SMARTSIG:Rectally 3 Times Daily PRN     hydrOXYzine (ATARAX) 50 MG tablet Take 25-50 mg by mouth 2 (two) times daily as needed for itching.     metoCLOPramide  (REGLAN ) 5 MG tablet Take 5 mg by mouth as needed.     ondansetron  (ZOFRAN -ODT) 8 MG disintegrating tablet Take 0.5 tablets (4 mg total) by mouth every 8 (eight) hours as needed for nausea. 20 tablet 5   polyethylene glycol powder (GLYCOLAX /MIRALAX ) 17 GM/SCOOP powder Take 17 g by mouth as needed for moderate constipation.     promethazine  (PHENERGAN ) 25 MG tablet  Take 25 mg by mouth every 4 (four) hours as needed for nausea.     Rimegepant Sulfate (NURTEC) 75 MG TBDP Take 1 tablet (75 mg total) by mouth as needed for up to 1 dose (Maximum 1 tablet in 24 hours.). 8 tablet 11   silodosin (RAPAFLO) 8 MG CAPS capsule Take 8 mg by mouth daily with breakfast.     traZODone  (DESYREL ) 50 MG tablet Take 1 tablet (50 mg total) by mouth at bedtime as needed for sleep. 30 tablet 3   amoxicillin  (AMOXIL ) 500 MG tablet Take 1 tablet (500 mg total) by mouth 2 (two) times daily. 20 tablet 0   No current facility-administered medications on file prior to visit.    Allergies  Allergen Reactions   Erythromycin Anaphylaxis and Nausea And Vomiting   Nalbuphine Other (See Comments)    States loss of consciousness     Cefdinir  Diarrhea, Nausea And Vomiting and Rash   Cephalexin Diarrhea and Nausea And Vomiting   Cephalosporins Diarrhea and Nausea And Vomiting   Hydrocodone -Acetaminophen  Nausea And Vomiting   Ketorolac  Nausea And Vomiting    Other reaction(s): upset stomach   Prochlorperazine Edisylate Other (See Comments)    uncontrollable shake - tremor    Restasis  [Cyclosporine ] Other (See Comments)    migraines   Tyloxapol Swelling   Bacitracin-Polymyxin B     Other reaction(s): rash gets worse Other reaction(s): rash gets worse   Codeine  Other (See Comments)    Other reaction(s): stomach upset   Nalbuphine Hcl     Other reaction(s): stomach upset   Prochlorperazine     Other reaction(s): tremors/vomiting   Propranolol  Diarrhea   Rosuvastatin  Diarrhea   Topamax [Topiramate] Diarrhea and Nausea And Vomiting   Zetia  [Ezetimibe ] Diarrhea   Cefdinir  Diarrhea, Nausea And Vomiting and Rash    Other reaction(s): too stronge   Doxycycline Hyclate Nausea Only and Other (See Comments)    abd pain   Iodinated Contrast Media Other (See Comments)    Lightheaded, flushed feeling (explained to patient these are normal side effects of IV iodinated contrast, not allergies, but he wanted this noted in the epic; he tolerated epidural steroid injections w/ contrast w/o difficulty)   Ketoconazole  Itching   Ketorolac  Tromethamine  Rash    REACTION: Reaction not known   Latex Itching and Rash   Neosporin [Neomycin-Bacitracin Zn-Polymyx] Rash   Oxycodone -Acetaminophen  Other (See Comments)    Tylox caused constipation   Prednisone  Nausea And Vomiting    Oral route, only Other reaction(s): stomach spasms    Assessment/Plan:  1. Hyperlipidemia -  Hyperlipidemia Assessment: LDL-C above goal of <70 Did not tolerate rosuvastatin  10mg , atorvastatin  or ezetimibe  + CAC score- puts him at at 9.8% 10 year risk (intermediate risk) Discussed PCSK9i vs Nexletol  Plan: Patient would like to proceed with  Repatha PA for Repatha Labs in 3 months    Thank you,  Berna Gitto D Journii Nierman, Pharm.JONETTA SARAN, CPP [facility] [address], [place], [place] [address]  Phone: [phone]; Fax: [phone]

## 2023-10-22 NOTE — Telephone Encounter (Signed)
 Pharmacy Patient Advocate Encounter  Received notification from [facility] [employer]/RX [employer] that Prior Authorization for REPATHA has been APPROVED from 10/22/23 to 04/19/24. Ran test claim, Copay is $47. This test claim was processed through [facility]- copay amounts may vary at other pharmacies due to pharmacy/plan contracts, or as the patient moves through the different stages of their insurance plan.

## 2023-10-22 NOTE — Addendum Note (Signed)
 Addended by: Alann Avey D on: 10/22/2023 01:29 PM   Modules accepted: Orders

## 2023-10-22 NOTE — Patient Instructions (Signed)
 Repatha- shot given every 2 weeks Nexletol- pill taken daily  [phone]    Repatha is a cholesterol medication that improved your body's ability to get rid of bad cholesterol known as LDL. It can lower your LDL up to 60%! It is an injection that is given under the skin every 2 weeks. The medication often requires a prior authorization from your insurance company. We will take care of submitting all the necessary information to your insurance company to get it approved. The most common side effects of Repatha include runny nose, symptoms of the common cold, rarely flu or flu-like symptoms, back/muscle pain in about 3-4% of the patients, and redness, pain, or bruising at the injection site. Tell your healthcare provider if you have any side effect that bothers you or that does not go away.

## 2023-10-23 ENCOUNTER — Telehealth: Payer: Self-pay | Admitting: Pharmacy Technician

## 2023-10-23 ENCOUNTER — Other Ambulatory Visit (HOSPITAL_COMMUNITY): Payer: Self-pay

## 2023-10-23 ENCOUNTER — Encounter: Admitting: Physical Therapy

## 2023-10-23 NOTE — Telephone Encounter (Signed)
 Please get patient [employer] grant and add info to [facility]. Please mark profile for delivery. Will send a 3 months supply to [facility]. Pt agreeable to trying [facility] pharmacy.

## 2023-10-23 NOTE — Telephone Encounter (Signed)
 Patient Advocate Encounter   The patient was approved for a [employer] grant that will help cover the cost of repatha Total amount awarded, 2500.00.  Effective: 09/23/23 - 09/21/24  APW:[id number] ERW:[id number] Hmnle:[id number] PI:[id number]  [employer] ID: [id number]   Pharmacy provided with approval and processing information. Patient informed via mychart

## 2023-10-25 NOTE — Progress Notes (Unsigned)
 [name] [address] [place] [address] Phone: [phone] Subjective:   William [name], am serving as a scribe for Dr. Arthea [name].  [name] seeing this patient by the request  of:  William Righter, MD  CC: Pain all over  YEP:Dlagzrupcz  09/10/2023 Likely no significant radicular symptoms.  Continue with the conservative therapy.  Continue to work with physical therapy.  No change in medications at the moment.  Try to increase activity.  Follow-up again in 6 weeks      Update 10/31/2023 William [name] is a 63 y.o. male coming in with complaint of lumbar spine and pelvic pain. Patient states still having right shoulder/ upper back pain.  Overall patient continues to have some discomfort but nothing severe.  Has been having to take care of his mother which has been somewhat difficult.  PSA has decreased from 2.48 now down to 1.12.  This is the lowest it has been since his baseline 9 years ago.    Past Medical History:  Diagnosis Date   Allergy    Anxiety    Chronic kidney disease    Depression    Diverticulitis    Gastroparesis    Heart murmur    Migraine headache    Mitral valve prolapse    SVT (supraventricular tachycardia) (HCC)    Past Surgical History:  Procedure Laterality Date   COLONOSCOPY W/ BIOPSIES  2023   RADIOACTIVE SEED IMPLANT N/A 01/19/2022   Procedure: RADIOACTIVE SEED IMPLANT/BRACHYTHERAPY IMPLANT;  Surgeon: Watt Rush, MD;  Location: [facility];  Service: Urology;  Laterality: N/A;   SPACE OAR INSTILLATION N/A 01/19/2022   Procedure: SPACE OAR INSTILLATION;  Surgeon: Watt Rush, MD;  Location: [facility];  Service: Urology;  Laterality: N/A;   Social History   Socioeconomic History   Marital status: Married    Spouse name: Not on file   Number of children: 0   Years of education: Not on file   Highest education level: Bachelor's degree (e.g., BA, AB, BS)  Occupational  History   Not on file  Tobacco Use   Smoking status: Never   Smokeless tobacco: Never  Vaping Use   Vaping status: Never Used  Substance and Sexual Activity   Alcohol use: No    Alcohol/week: 0.0 standard drinks of alcohol   Drug use: No   Sexual activity: Yes    Partners: Female  Other Topics Concern   Not on file  Social History Narrative   Pt is married lives with spouse in 1 story apartment he has No children   BS degree- he is right handed   Drinks soda  Ginger ale, no coffee no tea   Right handed   Social Drivers of Corporate investment banker Strain: Low Risk  (10/25/2021)   Overall Financial Resource Strain (CARDIA)    Difficulty of Paying Living Expenses: Not hard at all  Food Insecurity: No Food Insecurity (10/25/2021)   Hunger Vital Sign    Worried About Running Out of Food in the Last Year: Never true    Ran Out of Food in the Last Year: Never true  Transportation Needs: No Transportation Needs (10/25/2021)   PRAPARE - Administrator, Civil Service (Medical): No    Lack of Transportation (Non-Medical): No  Physical Activity: Not on file  Stress: Stress Concern Present (10/25/2021)   [employer] of Occupational Health - Occupational Stress Questionnaire  Feeling of Stress : Rather much  Social Connections: Not on file   Allergies  Allergen Reactions   Erythromycin Anaphylaxis and Nausea And Vomiting   Nalbuphine Other (See Comments)    States loss of consciousness    Cefdinir  Diarrhea, Nausea And Vomiting and Rash   Cephalexin Diarrhea and Nausea And Vomiting   Cephalosporins Diarrhea and Nausea And Vomiting   Hydrocodone -Acetaminophen  Nausea And Vomiting   Ketorolac  Nausea And Vomiting    Other reaction(s): upset stomach   Prochlorperazine Edisylate Other (See Comments)    uncontrollable shake - tremor    Restasis  [Cyclosporine ] Other (See Comments)    migraines   Tyloxapol Swelling   Bacitracin-Polymyxin B     Other reaction(s):  rash gets worse Other reaction(s): rash gets worse   Codeine  Other (See Comments)    Other reaction(s): stomach upset   Nalbuphine Hcl     Other reaction(s): stomach upset   Prochlorperazine     Other reaction(s): tremors/vomiting   Propranolol  Diarrhea   Rosuvastatin  Diarrhea   Topamax [Topiramate] Diarrhea and Nausea And Vomiting   Zetia  [Ezetimibe ] Diarrhea   Cefdinir  Diarrhea, Nausea And Vomiting and Rash    Other reaction(s): too stronge   Doxycycline Hyclate Nausea Only and Other (See Comments)    abd pain   Iodinated Contrast Media Other (See Comments)    Lightheaded, flushed feeling (explained to patient these are normal side effects of IV iodinated contrast, not allergies, but he wanted this noted in the epic; he tolerated epidural steroid injections w/ contrast w/o difficulty)   Ketoconazole  Itching   Ketorolac  Tromethamine  Rash    REACTION: Reaction not known   Latex Itching and Rash   Neosporin [Neomycin-Bacitracin Zn-Polymyx] Rash   Oxycodone -Acetaminophen  Other (See Comments)    Tylox caused constipation   Prednisone  Nausea And Vomiting    Oral route, only Other reaction(s): stomach spasms   Family History  Problem Relation Age of Onset   Hypertension Mother    Lung cancer Father 45 - 12   Hypertension Brother    Kidney failure Maternal Grandmother    Prostate cancer Neg Hx    Kidney cancer Neg Hx      Current Outpatient Medications (Cardiovascular):    diltiazem  (CARDIZEM  CD) 120 MG 24 hr capsule, Take 1 capsule (120 mg total) by mouth daily.   diltiazem  (CARDIZEM ) 30 MG tablet, TAKE (1) TABLET AS NEEDED FOR PALPITATIONS   Evolocumab (REPATHA SURECLICK) 140 MG/ML SOAJ, Inject 140 mg into the skin every 14 (fourteen) days.  Current Outpatient Medications (Respiratory):    fluticasone  (FLONASE ) 50 MCG/ACT nasal spray, Place 2 sprays into both nostrils daily.   promethazine  (PHENERGAN ) 25 MG tablet, Take 25 mg by mouth every 4 (four) hours as needed for  nausea.  Current Outpatient Medications (Analgesics):    Galcanezumab -gnlm (EMGALITY ) 120 MG/ML SOAJ, Inject 120 mg as directed every 28 (twenty-eight) days. INJECT 120MG  (1 PEN) UNDER THE SKIN EVERY MONTH   Rimegepant Sulfate (NURTEC) 75 MG TBDP, Take 1 tablet (75 mg total) by mouth as needed for up to 1 dose (Maximum 1 tablet in 24 hours.).   Current Outpatient Medications (Other):    amoxicillin  (AMOXIL ) 500 MG tablet, Take 1 tablet (500 mg total) by mouth 2 (two) times daily.   hydrocortisone (ANUSOL-HC) 2.5 % rectal cream, SMARTSIG:Rectally 3 Times Daily PRN   hydrOXYzine (ATARAX) 50 MG tablet, Take 25-50 mg by mouth 2 (two) times daily as needed for itching.   metoCLOPramide  (REGLAN ) 5 MG tablet, Take  5 mg by mouth as needed.   ondansetron  (ZOFRAN -ODT) 8 MG disintegrating tablet, Take 0.5 tablets (4 mg total) by mouth every 8 (eight) hours as needed for nausea.   polyethylene glycol powder (GLYCOLAX /MIRALAX ) 17 GM/SCOOP powder, Take 17 g by mouth as needed for moderate constipation.   silodosin (RAPAFLO) 8 MG CAPS capsule, Take 8 mg by mouth daily with breakfast.   traZODone  (DESYREL ) 50 MG tablet, Take 1 tablet (50 mg total) by mouth at bedtime as needed for sleep.   Reviewed prior external information including notes and imaging from  primary care provider As well as notes that were available from care everywhere and other healthcare systems.  Past medical history, social, surgical and family history all reviewed in electronic medical record.  No pertanent information unless stated regarding to the chief complaint.   Review of Systems:  No headache, visual changes, nausea, vomiting, diarrhea, constipation, dizziness, abdominal pain, skin rash, fevers, chills, night sweats, weight loss, swollen lymph nodes, body aches, joint swelling, chest pain, shortness of breath, mood changes. POSITIVE muscle aches  Objective  Blood pressure 110/72, pulse 86, height 5' 10 (1.778 [name]), weight 147  lb (66.7 kg), SpO2 98%.   General: No apparent distress alert and oriented x3 mood and affect normal, dressed appropriately.  HEENT: Pupils equal, extraocular movements intact  Respiratory: Patient's speak in full sentences and does not appear short of breath  Cardiovascular: No lower extremity edema, non tender, no erythema  Medical exam fairly unremarkable.  Still impingement noted of the right shoulder.  Some mild loss of lordosis of the lumbar spine.    Impression and Recommendations:     The above documentation has been reviewed and is accurate and complete William [name] Xeng Kucher, DO

## 2023-10-29 ENCOUNTER — Encounter: Payer: Self-pay | Admitting: Pharmacist

## 2023-10-29 ENCOUNTER — Other Ambulatory Visit (HOSPITAL_COMMUNITY): Payer: Self-pay

## 2023-10-29 MED ORDER — REPATHA SURECLICK 140 MG/ML ~~LOC~~ SOAJ
1.0000 mL | SUBCUTANEOUS | 3 refills | Status: AC
  Filled 2023-10-29: qty 6, 84d supply, fill #0
  Filled 2024-04-15: qty 6, 84d supply, fill #1

## 2023-10-29 NOTE — Telephone Encounter (Signed)
 Rx sent downstairs to [facility] pharmacy for 90 days per pt request. HW grant is now active.

## 2023-10-29 NOTE — Addendum Note (Signed)
 Addended by: Sylas Twombly D on: 10/29/2023 10:26 AM   Modules accepted: Orders

## 2023-10-31 ENCOUNTER — Encounter: Payer: Self-pay | Admitting: Family Medicine

## 2023-10-31 ENCOUNTER — Ambulatory Visit: Admitting: Family Medicine

## 2023-10-31 VITALS — BP 110/72 | HR 86 | Ht 70.0 in | Wt 147.0 lb

## 2023-10-31 DIAGNOSIS — R102 Pelvic and perineal pain: Secondary | ICD-10-CM

## 2023-10-31 DIAGNOSIS — C61 Malignant neoplasm of prostate: Secondary | ICD-10-CM

## 2023-10-31 DIAGNOSIS — E538 Deficiency of other specified B group vitamins: Secondary | ICD-10-CM

## 2023-10-31 DIAGNOSIS — M75111 Incomplete rotator cuff tear or rupture of right shoulder, not specified as traumatic: Secondary | ICD-10-CM

## 2023-10-31 DIAGNOSIS — M2550 Pain in unspecified joint: Secondary | ICD-10-CM | POA: Diagnosis not present

## 2023-10-31 DIAGNOSIS — G8929 Other chronic pain: Secondary | ICD-10-CM | POA: Diagnosis not present

## 2023-10-31 MED ORDER — CYANOCOBALAMIN 1000 MCG/ML IJ SOLN
1000.0000 ug | Freq: Once | INTRAMUSCULAR | Status: AC
  Administered 2023-10-31: 1000 ug via INTRAMUSCULAR

## 2023-10-31 NOTE — Assessment & Plan Note (Signed)
 Doing relatively well though with conservative therapy but has been noncompliant with patient having to be the primary caregiving for his ailing mother.  Stress seems to be doing better and is going to be following up with counseling in the near future.  Discussed multiple different things including different types of vaccines for the patient.  At the moment medications seem to be doing well and no other significant changes.  Follow-up with me again in 2 to 3 months.  Total time with patient discussing all this and reviewing patient's labs 32 minutes

## 2023-10-31 NOTE — Patient Instructions (Addendum)
 Good to see you! Dr. Magdalen or Dr. Verta I do think tetanus and shingles vaccine are good Repatha start Monday Labs future in 2 months (Get 3-5 days before next appointment) B12 injection today See you again in 2 months

## 2023-10-31 NOTE — Assessment & Plan Note (Signed)
 History of B12 deficiency and given injection today

## 2023-11-04 ENCOUNTER — Encounter: Payer: Self-pay | Admitting: Cardiovascular Disease

## 2023-11-06 ENCOUNTER — Telehealth: Payer: Self-pay | Admitting: Family Medicine

## 2023-11-06 ENCOUNTER — Other Ambulatory Visit: Payer: Self-pay

## 2023-11-06 MED ORDER — TIZANIDINE HCL 2 MG PO TABS: 2.0000 mg | ORAL_TABLET | Freq: Every day | ORAL | 0 refills | Status: AC

## 2023-11-06 NOTE — Telephone Encounter (Signed)
 Pt called, seen 7/3 for shoulder. Shoulder pain is worse, more painful when standing. On wait list for PT at [facility]. Using Lidocaine  patches.  Would we recommend muscle relaxer or cortisone shot to help?

## 2023-11-11 NOTE — Telephone Encounter (Signed)
 Pt coming in on July 17 at 11:30 to get his 1st Repatha  injection in the office

## 2023-11-12 ENCOUNTER — Ambulatory Visit: Admitting: Family Medicine

## 2023-11-14 ENCOUNTER — Ambulatory Visit: Admitting: Pharmacist

## 2023-11-15 ENCOUNTER — Ambulatory Visit: Admitting: Pharmacist

## 2023-11-15 DIAGNOSIS — R059 Cough, unspecified: Secondary | ICD-10-CM | POA: Diagnosis not present

## 2023-11-15 DIAGNOSIS — R300 Dysuria: Secondary | ICD-10-CM | POA: Diagnosis not present

## 2023-11-15 DIAGNOSIS — M791 Myalgia, unspecified site: Secondary | ICD-10-CM | POA: Diagnosis not present

## 2023-11-20 ENCOUNTER — Ambulatory Visit: Admitting: Pharmacist

## 2023-11-20 ENCOUNTER — Ambulatory Visit: Payer: Self-pay | Attending: Family Medicine | Admitting: Physical Therapy

## 2023-11-20 DIAGNOSIS — M5416 Radiculopathy, lumbar region: Secondary | ICD-10-CM | POA: Insufficient documentation

## 2023-11-20 DIAGNOSIS — M25511 Pain in right shoulder: Secondary | ICD-10-CM | POA: Insufficient documentation

## 2023-11-20 DIAGNOSIS — M6281 Muscle weakness (generalized): Secondary | ICD-10-CM | POA: Insufficient documentation

## 2023-11-20 DIAGNOSIS — M25611 Stiffness of right shoulder, not elsewhere classified: Secondary | ICD-10-CM | POA: Diagnosis not present

## 2023-11-20 NOTE — Therapy (Signed)
 OUTPATIENT PHYSICAL THERAPY THORACOLUMBAR PROGRESS NOTE/RECERTIFICATION and SHOULDER EVALUATION   Patient Name: William [name] MRN: [id number] DOB:1960/06/04, 63 y.o., male Today's Date: 11/20/2023  END OF SESSION:  PT End of Session - 11/20/23 0849     Visit Number 3    Date for PT Re-Evaluation 01/15/24    Authorization Type [employer]    Progress Note Due on Visit 10    PT Start Time 0850    PT Stop Time 0929    PT Time Calculation (min) 39 min    Activity Tolerance Patient tolerated treatment well          Past Medical History:  Diagnosis Date   Allergy    Anxiety    Chronic kidney disease    Depression    Diverticulitis    Gastroparesis    Heart murmur    Migraine headache    Mitral valve prolapse    SVT (supraventricular tachycardia) (HCC)    Past Surgical History:  Procedure Laterality Date   COLONOSCOPY W/ BIOPSIES  2023   RADIOACTIVE SEED IMPLANT N/A 01/19/2022   Procedure: RADIOACTIVE SEED IMPLANT/BRACHYTHERAPY IMPLANT;  Surgeon: Watt Rush, MD;  Location: [facility];  Service: Urology;  Laterality: N/A;   SPACE OAR INSTILLATION N/A 01/19/2022   Procedure: SPACE OAR INSTILLATION;  Surgeon: Watt Rush, MD;  Location: [facility];  Service: Urology;  Laterality: N/A;   Patient Active Problem List   Diagnosis Date Noted   Depression 10/11/2022   B12 deficiency 06/08/2022   Loss of weight 06/08/2022   Genetic testing 11/10/2021   Malignant neoplasm of prostate (HCC) 10/24/2021   Right calf pain 09/18/2021   Caregiver burden 10/18/2020   Arthritis of right acromioclavicular joint 02/18/2020   Right rotator cuff tear 12/18/2019   Degenerative cervical disc 05/07/2019   Left lumbar radiculopathy 10/10/2017   Migraine without aura and without status migrainosus, not intractable 06/22/2017   Vitamin D  deficiency 05/28/2017   Scrotal pain 05/28/2017   Paroxysmal SVT (supraventricular tachycardia) (HCC)  05/28/2017   Mild left inguinal hernia 11/23/2016   Chronic pelvic pain in male 09/28/2016   Abdominal pain, left lower quadrant 09/28/2016   Isolated proteinuria without specific morphologic lesion 06/27/2016   Left testicular pain 06/27/2016   Acute left flank pain 06/27/2016   Epididymitis, left 06/05/2016   Pelvic floor dysfunction 04/01/2016   GERD (gastroesophageal reflux disease) 10/13/2015   Gastroparesis 10/13/2015   Intractable chronic migraine without aura and without status migrainosus 12/15/2013   Hyperlipidemia 04/04/2012   Mitral valve disease 07/15/2009   IRRITABLE BOWEL SYNDROME 07/15/2009   Palpitations 06/28/2009    PCP: Kip Righter MD  REFERRING PROVIDER: Claudene Hussar DO  REFERRING DIAG: F45.83 left lumbar radiculopathy  Rationale for Evaluation and Treatment: Rehabilitation  THERAPY DIAG:  Left lumbar radiculopathy; weakness  ONSET DATE: > 6 month  SUBJECTIVE:  SUBJECTIVE STATEMENT: Fatigue is better and my back is better but  I've hurt my right shoulder.  I don't how but it started  about 3 weeks ago. Worse with sitting without back support.  Reaching behind back hurts. Numbness/tingling right 4th/5th finger. Muscle relaxers didn't work.  Not bothering at night.  No back issues recently. I refrain from picking up packages of bottled water  for back and avoiding using right arm for a lot of things.   I almost didn't come today still extremely fatigued.  I don't have the stamina to do anything.  The doctors were happy about the last PSA.  It's a lot to take care of my mom.  My back is better.  I did have an episode of back pain from lifting a few weeks ago but it got better.       EVAL: My strength is diminished. The first of week of April having another PSA test.  The  numbers are all over the place.  I need to avoid long car rides, vigorous exercise and the bike prior to that.  Had a lumbar ESI in December and it helped a whole lot.  Still with left back and left LE symptoms but lower in intensity.  Sleeps on sofa with back support on side (needs support), bed is uncomfortable. 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 No NPRS scale: 5/10 Pain location: posterior shoulder right  Pain orientation: right   PAIN TYPE: aching Pain description: constant  Aggravating factors: shoulder: sit without back support, using the computer, reaching behind the back Relieving factors: shot helped 90%; walking  PRECAUTIONS: None    WEIGHT BEARING RESTRICTIONS: No  FALLS:  Has patient fallen in last 6 months? No  LIVING ENVIRONMENT: Lives with: lives with their family Lives in: House/apartment   PLOF: Independent with basic ADLs  PATIENT GOALS: properly and safely lift 20+#; get stronger; exercise and walk more   OBJECTIVE:  Note: Objective measures were completed at Evaluation unless otherwise noted.  DIAGNOSTIC FINDINGS:  Nov 2024 IMPRESSION: 1. L4-5: Disc degeneration with shallow broad-based protrusion. Mild narrowing of both lateral recesses. Moderate bilateral foraminal encroachment. No visible change since 2023. 2. L5-S1: Disc degeneration with endplate osteophytes and shallow disc protrusion. Moderate bilateral foraminal narrowing. Either L5 nerve could possibly be affected. No apparent change since last year. PATIENT SURVEYS:  Modified Oswestry 38%  5/16: 34% 7/23:  Quick DASH: 68.2  COGNITION: Overall cognitive status: Within functional limits for tasks assessed      POSTURE: decreased lumbar lordosis  UPPER EXTREMITY ROM:  Limited cervical sidebending to the  left  Active ROM Right 7/23 eval Left eval  Shoulder flexion 96 pain 165  Shoulder extension    Shoulder abduction 136 163  Shoulder adduction    Shoulder extension    Shoulder internal rotation Radial styloid to iliac crest  Radial styloid to T10  Shoulder external rotation 57 painful  70  Elbow flexion    Elbow extension    Wrist flexion    Wrist extension    Wrist ulnar deviation    Wrist radial deviation    Wrist pronation    Wrist supination     (Blank rows = not tested)   UPPER EXTREMITY MMT:  MMT Right eval Left eval  Shoulder flexion 4-  5  Shoulder extension    Shoulder abduction 4- 5  Shoulder adduction    Shoulder extension    Shoulder internal rotation 4- 5  Shoulder external rotation 4- 5  Middle trapezius    Lower trapezius    Elbow flexion 4 5  Elbow extension 4 5  Wrist flexion    Wrist extension    Wrist ulnar deviation    Wrist radial deviation    Wrist pronation    Wrist supination    Grip strength     (Blank rows = not tested)   SHOULDER PALPATION: Marked tenderness right supraspinatus, infraspinatus, upper traps and rhomboids  POSTURE:  Sits with rounded shoulders without back support   LUMBAR ROM: able to do partial squat cautiously to pick up small object from the floor 5/16: able to do a cautious half squat  AROM eval 5/16  Flexion 25% limited 25% limited  Extension 10 degrees 10 degrees  Right lateral flexion 20 degrees painful 25 degrees  Left lateral flexion 20 degrees 15 degrees feels tight  Right rotation    Left rotation     (Blank rows = not tested)  TRUNK STRENGTH:  Decreased activation of transverse abdominus muscles; abdominals 4-/5; decreased activation of lumbar multifidi; trunk extensors 4-/5; quadruped LE lift with moderate difficulty and increased pain, mild pain with UE lift  LOWER EXTREMITY ROM:   neural tension with terminal knee extension on left in sitting  LOWER EXTREMITY MMT:  right hip abduction 4-/5,  left hip abduction 3+/5, right hip extension 4/5, left hip extension 4-/5  TRUNK STRENGTH:  Decreased activation of transverse abdominus muscles; abdominals 4-/5; decreased activation of lumbar multifidi; trunk extensors 4-/5    FUNCTIONAL TESTS:  5x sit to stand 14.96 no  SLS on right 15 sec, left with lateral trunk lean 5 sec  5/16: 18.85 sec no hands (out of breath)  LUMBAR SPECIAL TESTS:  + slump test on left   GAIT: Comments: WNLs  TREATMENT DATE: 11/20/23 Shoulder evaluation as above Postural education Shoulder initial HEP    TREATMENT DATE: 09/13/23 Status update Patient education on the benefit of exercise to reduce fatigue, increase energy Modified Oswestry Index Lumbar ROM SLS Squat test Review of initial HEP including reprint        TREATMENT DATE: 07/12/23 evaluation   Pt concerned about the effect of exercise with upcoming PSA test, will hold PT start date until the first week of April   Initial HEP as below                                                                                                                             PATIENT EDUCATION:  Education details: Educated patient on anatomy and physiology of current symptoms, prognosis, plan of care as well as initial self care strategies to promote recovery  Person educated: Patient Education method: Explanation Education comprehension: verbalized understanding  HOME EXERCISE PROGRAM: Access Code: [email or web address] URL: [email or web address]  Date: 11/20/2023 Prepared by: Glade Pesa  Exercises - Supine Shoulder Flexion Overhead with Dowel (Mirrored)  - 1 x daily - 7 x weekly - 1 sets - 7-8 reps - 10 hold - Seated Upper Trapezius Stretch  - 1 x daily - 7 x weekly - 1 sets - 3 reps - 20 hold - Seated Correct Posture  - 1 x daily - 7 x weekly - 1 sets - 10 reps - Circular Shoulder Pendulum with Table Support  - 1 x daily - 7 x weekly - 1 sets - 10 reps  Access Code: [email or web address]  back URL: [email or web address] Date: 07/12/2023 Prepared by: Glade Pesa  Exercises - Sit to Stand with Arms Crossed  - 2 x daily - 7 x weekly - 2 sets - 5 reps - Standing Hip Abduction  - 2 x daily - 7 x weekly - 1 sets - 10 reps - Standing Hip Extension  - 2 x daily - 7 x weekly - 1 sets - 10 reps - Wall Push Up  - 1 x daily - 7 x weekly - 1 sets - 10 reps  ASSESSMENT:  CLINICAL IMPRESSION: Cathlyn returns after a gap in care for treatment of back pain.  He has had to cancel some appointments due to profound fatigue and migraines.  He returns today with reports of improved back pain however his current issue is right shoulder pain for the last 3 weeks.  He would like to focus his PT treatment in addressing this severe pain of his dominant side.  Pain is primarily on the posterior aspect and worsened with sitting without back support, reaching out to the side, and behind his back. The patient would benefit from skilled PT to address shoulder range of motion limitations particularly shoulder elevation and internal rotation as well as glenohumeral, scapular and thoracic joint hypomobility.  The patient would also benefit from strengthening to correct asymmetries including weakness in rotator cuff and periscapular musculature and decreasing overuse of the upper trapezius.  These impairments  and pain contribute to difficulties with home ADLS like reaching overhead, lifting objects to higher shelves, reaching behind the back and with carrying objects.   OBJECTIVE IMPAIRMENTS: decreased activity tolerance, decreased mobility, decreased strength, impaired perceived functional ability, and pain.   ACTIVITY LIMITATIONS: carrying, lifting, bending, sitting, squatting, sleeping, reach over head, and caring for others  PARTICIPATION LIMITATIONS: meal prep, interpersonal relationship, shopping, and community activity  PERSONAL FACTORS: Time since onset of injury/illness/exacerbation and 1-2  comorbidities: prostate cancer, chronic migraines are also affecting patient's functional outcome.   REHAB POTENTIAL: Good  CLINICAL DECISION MAKING: moderate due to co-morbidities and multiple body parts affected  EVALUATION COMPLEXITY: moderate   GOALS: Goals reviewed with patient? Yes  SHORT TERM GOALS: Target date: 10/11/2023   The patient will demonstrate knowledge of basic self care strategies and exercises to promote healing including  Baseline:  Goal status: met for back 7/23   2.   The patient will report a 30% improvement in pain levels with functional activities which are currently difficult including lifting, bending, standing  Baseline:  Goal status: met for back 7/23     3.  Patient will demonstrate good hip hinge and squat technique for light lifting Baseline:  Goal status: met for back 7/23   4.  5x sit to stand time decreased to < 14 sec  Baseline:  Goal status: ongoing  LONG TERM GOALS: Target date: 01/15/2024      The patient will be  independent in a safe self progression of a home exercise program and/or ex class to promote further recovery of function  Baseline:  Goal status: INITIAL   2.  The patient will report a 60% improvement in pain levels with functional activities which are currently difficult including using right UE for reaching, lifting,  Baseline:  Goal status: revised   3.  Improved upper and lower body strength needed to kneel to the floor and get back up with ease Baseline:  Goal status: INITIAL   4.  The patient will have improved shoulder elevation ROM to at least 150 degrees needed for grooming/dressing purposes as well as reaching high shelves  Baseline:  Goal status: INITIAL    5.  Modified Oswestry Index improved to 28% indicating improved function with less back pain Baseline:  Goal status: INITIAL  6. The patient will have grossly 4+/5 strength needed to lift and lower a 5# object from a high shelf  INITIAL  7.  Quick DASH UE functional outcome score improved to 55% indicating improved shoulder pain and functional use PLAN:  PT FREQUENCY: 2x/week  PT DURATION: 8 weeks  PLANNED INTERVENTIONS: 97164- PT Re-evaluation, 97110-Therapeutic exercises, 97530- Therapeutic activity, 97112- Neuromuscular re-education, 97535- Self Care, [id number]- Manual therapy, [id number]- Aquatic Therapy, H9716- Electrical stimulation (unattended), [id number]- Electrical stimulation (manual), [id number]- Ultrasound, [id number]- Traction (mechanical), [id number]- Ionotophoresis 4mg /ml Dexamethasone , Patient/Family education, Taping, Dry Needling, Joint manipulation, Spinal mobilization, Cryotherapy, and Moist heat.  PLAN FOR NEXT SESSION: right shoulder ROM; KT tape, ionto if re-cert signed;  graded exposure to exercise secondary to high fatigue levels with emphasis on trunk and LE strengthening; recheck Oswestry for lumbar goals  Glade Pesa, PT 11/20/23 3:46 PM Phone: [phone] Fax: [phone]

## 2023-11-21 ENCOUNTER — Other Ambulatory Visit: Payer: Self-pay

## 2023-11-21 ENCOUNTER — Ambulatory Visit: Admitting: Pharmacist

## 2023-11-21 MED ORDER — PREDNISONE 20 MG PO TABS: 40.0000 mg | ORAL_TABLET | Freq: Every day | ORAL | 0 refills | Status: DC

## 2023-11-21 NOTE — Telephone Encounter (Signed)
 Patient called stating that the medication has not been helpful with his pain. Is there something else he can try?  We have him on the cancellation list for a sooner appointment for a cortisone injection.

## 2023-11-27 DIAGNOSIS — E785 Hyperlipidemia, unspecified: Secondary | ICD-10-CM | POA: Diagnosis not present

## 2023-11-27 DIAGNOSIS — E559 Vitamin D deficiency, unspecified: Secondary | ICD-10-CM | POA: Diagnosis not present

## 2023-11-27 DIAGNOSIS — R5382 Chronic fatigue, unspecified: Secondary | ICD-10-CM | POA: Diagnosis not present

## 2023-11-27 DIAGNOSIS — K3184 Gastroparesis: Secondary | ICD-10-CM | POA: Diagnosis not present

## 2023-11-27 DIAGNOSIS — Z713 Dietary counseling and surveillance: Secondary | ICD-10-CM | POA: Diagnosis not present

## 2023-11-28 ENCOUNTER — Ambulatory Visit: Admitting: Licensed Clinical Social Worker

## 2023-11-29 ENCOUNTER — Ambulatory Visit: Admitting: Pharmacist

## 2023-11-29 DIAGNOSIS — R5383 Other fatigue: Secondary | ICD-10-CM | POA: Diagnosis not present

## 2023-11-29 DIAGNOSIS — I2510 Atherosclerotic heart disease of native coronary artery without angina pectoris: Secondary | ICD-10-CM | POA: Diagnosis not present

## 2023-11-29 DIAGNOSIS — R519 Headache, unspecified: Secondary | ICD-10-CM | POA: Diagnosis not present

## 2023-11-29 DIAGNOSIS — R102 Pelvic and perineal pain: Secondary | ICD-10-CM | POA: Diagnosis not present

## 2023-11-29 DIAGNOSIS — E559 Vitamin D deficiency, unspecified: Secondary | ICD-10-CM | POA: Diagnosis not present

## 2023-11-29 DIAGNOSIS — R300 Dysuria: Secondary | ICD-10-CM | POA: Diagnosis not present

## 2023-11-29 DIAGNOSIS — G8929 Other chronic pain: Secondary | ICD-10-CM | POA: Diagnosis not present

## 2023-11-29 DIAGNOSIS — E785 Hyperlipidemia, unspecified: Secondary | ICD-10-CM | POA: Diagnosis not present

## 2023-11-29 DIAGNOSIS — E538 Deficiency of other specified B group vitamins: Secondary | ICD-10-CM | POA: Diagnosis not present

## 2023-12-02 ENCOUNTER — Ambulatory Visit: Admitting: Pharmacist

## 2023-12-03 NOTE — Progress Notes (Unsigned)
 [facility] [address] [place] [address] Phone: [phone] Subjective:   William [name], am serving as a scribe for Dr. Arthea Claudene.  I'm seeing this patient by the request  of:  Kip Righter, MD  CC: Multiple complaints  YEP:Dlagzrupcz  10/31/2023 History of B12 deficiency and given injection today   Doing relatively well though with conservative therapy but has been noncompliant with patient having to be the primary caregiving for his ailing mother.  Stress seems to be doing better and is going to be following up with counseling in the near future.  Discussed multiple different things including different types of vaccines for the patient.  At the moment medications seem to be doing well and no other significant changes.  Follow-up with me again in 2 to 3 months.  Total time with patient discussing all this and reviewing patient's labs 32 minutes     Update 12/04/2023 William [name] is a 63 y.o. male coming in with complaint of R shoulder and B hip pain. Patient states that he his shoulder pain is less than last visit.   Sees Stacy at [place] and she wants to do ionto. Have we seen the prescription for Dexamethasone ?  We have not seen this.  Still working with the trainer though.  Feels like it is helping some of his difficulties. Patient feels like he is making some progress at the moment.  Patient did bring labs in and did show some hematuria and low vitamin D .      Past Medical History:  Diagnosis Date   Allergy    Anxiety    Chronic kidney disease    Depression    Diverticulitis    Gastroparesis    Heart murmur    Migraine headache    Mitral valve prolapse    SVT (supraventricular tachycardia) (HCC)    Past Surgical History:  Procedure Laterality Date   COLONOSCOPY W/ BIOPSIES  2023   RADIOACTIVE SEED IMPLANT N/A 01/19/2022   Procedure: RADIOACTIVE SEED IMPLANT/BRACHYTHERAPY IMPLANT;  Surgeon: Watt Rush, MD;   Location: [facility];  Service: Urology;  Laterality: N/A;   SPACE OAR INSTILLATION N/A 01/19/2022   Procedure: SPACE OAR INSTILLATION;  Surgeon: Watt Rush, MD;  Location: [facility];  Service: Urology;  Laterality: N/A;   Social History   Socioeconomic History   Marital status: Married    Spouse name: Not on file   Number of children: 0   Years of education: Not on file   Highest education level: Bachelor's degree (e.g., BA, AB, BS)  Occupational History   Not on file  Tobacco Use   Smoking status: Never   Smokeless tobacco: Never  Vaping Use   Vaping status: Never Used  Substance and Sexual Activity   Alcohol use: No    Alcohol/week: 0.0 standard drinks of alcohol   Drug use: No   Sexual activity: Yes    Partners: Female  Other Topics Concern   Not on file  Social History Narrative   Pt is married lives with spouse in 1 story apartment he has No children   BS degree- he is right handed   Drinks soda  Ginger ale, no coffee no tea   Right handed   Social Drivers of Corporate investment banker Strain: Low Risk  (10/25/2021)   Overall Financial Resource Strain (CARDIA)    Difficulty of Paying Living Expenses: Not hard at all  Food Insecurity: No  Food Insecurity (10/25/2021)   Hunger Vital Sign    Worried About Running Out of Food in the Last Year: Never true    Ran Out of Food in the Last Year: Never true  Transportation Needs: No Transportation Needs (10/25/2021)   PRAPARE - Administrator, Civil Service (Medical): No    Lack of Transportation (Non-Medical): No  Physical Activity: Not on file  Stress: Stress Concern Present (10/25/2021)   [employer] of Occupational Health - Occupational Stress Questionnaire    Feeling of Stress : Rather much  Social Connections: Not on file   Allergies  Allergen Reactions   Erythromycin Anaphylaxis and Nausea And Vomiting   Nalbuphine Other (See Comments)    States loss of  consciousness    Cefdinir  Diarrhea, Nausea And Vomiting and Rash   Cephalexin Diarrhea and Nausea And Vomiting   Cephalosporins Diarrhea and Nausea And Vomiting   Hydrocodone -Acetaminophen  Nausea And Vomiting   Ketorolac  Nausea And Vomiting    Other reaction(s): upset stomach   Prochlorperazine Edisylate Other (See Comments)    uncontrollable shake - tremor    Restasis  [Cyclosporine ] Other (See Comments)    migraines   Tyloxapol Swelling   Bacitracin-Polymyxin B     Other reaction(s): rash gets worse Other reaction(s): rash gets worse   Codeine  Other (See Comments)    Other reaction(s): stomach upset   Nalbuphine Hcl     Other reaction(s): stomach upset   Prochlorperazine     Other reaction(s): tremors/vomiting   Propranolol  Diarrhea   Rosuvastatin  Diarrhea   Topamax [Topiramate] Diarrhea and Nausea And Vomiting   Zetia  [Ezetimibe ] Diarrhea   Cefdinir  Diarrhea, Nausea And Vomiting and Rash    Other reaction(s): too stronge   Doxycycline Hyclate Nausea Only and Other (See Comments)    abd pain   Iodinated Contrast Media Other (See Comments)    Lightheaded, flushed feeling (explained to patient these are normal side effects of IV iodinated contrast, not allergies, but he wanted this noted in the epic; he tolerated epidural steroid injections w/ contrast w/o difficulty)   Ketoconazole  Itching   Ketorolac  Tromethamine  Rash    REACTION: Reaction not known   Latex Itching and Rash   Neosporin [Neomycin-Bacitracin Zn-Polymyx] Rash   Oxycodone -Acetaminophen  Other (See Comments)    Tylox caused constipation   Prednisone  Nausea And Vomiting    Oral route, only Other reaction(s): stomach spasms   Family History  Problem Relation Age of Onset   Hypertension Mother    Lung cancer Father 59 - 65   Hypertension Brother    Kidney failure Maternal Grandmother    Prostate cancer Neg Hx    Kidney cancer Neg Hx     Current Outpatient Medications (Endocrine & Metabolic):     predniSONE  (DELTASONE ) 20 MG tablet, Take 2 tablets (40 mg total) by mouth daily with breakfast.  Current Outpatient Medications (Cardiovascular):    diltiazem  (CARDIZEM  CD) 120 MG 24 hr capsule, Take 1 capsule (120 mg total) by mouth daily.   diltiazem  (CARDIZEM ) 30 MG tablet, TAKE (1) TABLET AS NEEDED FOR PALPITATIONS   Evolocumab  (REPATHA  SURECLICK) 140 MG/ML SOAJ, Inject 140 mg into the skin every 14 (fourteen) days.  Current Outpatient Medications (Respiratory):    fluticasone  (FLONASE ) 50 MCG/ACT nasal spray, Place 2 sprays into both nostrils daily.   promethazine  (PHENERGAN ) 25 MG tablet, Take 25 mg by mouth every 4 (four) hours as needed for nausea.  Current Outpatient Medications (Analgesics):    Galcanezumab -gnlm (EMGALITY ) 120 MG/ML  SOAJ, Inject 120 mg as directed every 28 (twenty-eight) days. INJECT 120MG  (1 PEN) UNDER THE SKIN EVERY MONTH   Rimegepant Sulfate (NURTEC) 75 MG TBDP, Take 1 tablet (75 mg total) by mouth as needed for up to 1 dose (Maximum 1 tablet in 24 hours.).   Current Outpatient Medications (Other):    amoxicillin  (AMOXIL ) 500 MG tablet, Take 1 tablet (500 mg total) by mouth 2 (two) times daily.   hydrocortisone (ANUSOL-HC) 2.5 % rectal cream, SMARTSIG:Rectally 3 Times Daily PRN   hydrOXYzine (ATARAX) 50 MG tablet, Take 25-50 mg by mouth 2 (two) times daily as needed for itching.   metoCLOPramide  (REGLAN ) 5 MG tablet, Take 5 mg by mouth as needed.   ondansetron  (ZOFRAN -ODT) 8 MG disintegrating tablet, Take 0.5 tablets (4 mg total) by mouth every 8 (eight) hours as needed for nausea.   polyethylene glycol powder (GLYCOLAX /MIRALAX ) 17 GM/SCOOP powder, Take 17 g by mouth as needed for moderate constipation.   silodosin (RAPAFLO) 8 MG CAPS capsule, Take 8 mg by mouth daily with breakfast.   traZODone  (DESYREL ) 50 MG tablet, Take 1 tablet (50 mg total) by mouth at bedtime as needed for sleep.   Vitamin D , Ergocalciferol , (DRISDOL ) 1.25 MG (50000 UNIT) CAPS capsule,  Take 1 capsule (50,000 Units total) by mouth every 7 (seven) days.   Reviewed prior external information including notes and imaging from  primary care provider As well as notes that were available from care everywhere and other healthcare systems.  Past medical history, social, surgical and family history all reviewed in electronic medical record.  No pertanent information unless stated regarding to the chief complaint.   Review of Systems:  No headache, visual changes, nausea, vomiting, diarrhea, constipation, dizziness, abdominal pain, skin rash, fevers, chills, night sweats, weight loss, swollen lymph nodes, body aches, joint swelling, chest pain, shortness of breath, mood changes. POSITIVE muscle aches  Objective  Blood pressure 102/72, pulse 77, height 5' 10 (1.778 m), SpO2 99%.   General: No apparent distress alert and oriented x3 mood and affect normal, dressed appropriately.  Mildly cachectic seems to be not quite distressed as usual. HEENT: Pupils equal, extraocular movements intact  Respiratory: Patient's speak in full sentences and does not appear short of breath  Cardiovascular: No lower extremity edema, non tender, no erythema      Impression and Recommendations:

## 2023-12-04 ENCOUNTER — Ambulatory Visit: Admitting: Family Medicine

## 2023-12-04 VITALS — BP 102/72 | HR 77 | Ht 70.0 in

## 2023-12-04 DIAGNOSIS — M5030 Other cervical disc degeneration, unspecified cervical region: Secondary | ICD-10-CM

## 2023-12-04 DIAGNOSIS — E538 Deficiency of other specified B group vitamins: Secondary | ICD-10-CM | POA: Diagnosis not present

## 2023-12-04 DIAGNOSIS — M5416 Radiculopathy, lumbar region: Secondary | ICD-10-CM

## 2023-12-04 DIAGNOSIS — R800 Isolated proteinuria: Secondary | ICD-10-CM | POA: Diagnosis not present

## 2023-12-04 DIAGNOSIS — E559 Vitamin D deficiency, unspecified: Secondary | ICD-10-CM | POA: Diagnosis not present

## 2023-12-04 MED ORDER — CYANOCOBALAMIN 1000 MCG/ML IJ SOLN
1000.0000 ug | Freq: Once | INTRAMUSCULAR | Status: AC
  Administered 2023-12-04: 1000 ug via INTRAMUSCULAR

## 2023-12-04 MED ORDER — VITAMIN D (ERGOCALCIFEROL) 1.25 MG (50000 UNIT) PO CAPS: 50000.0000 [IU] | ORAL_CAPSULE | ORAL | 0 refills | Status: DC

## 2023-12-04 NOTE — Assessment & Plan Note (Signed)
 Known arthritic changes that I think is contributing to some of the aches and pains.  Discussed icing regimen and exercises, which activities to do in which ones to avoid.  Increase activity slowly.  Discussed icing regimen.  Follow-up again in 6 to 8 weeks total time with patient today discussing other modalities, 3 months.

## 2023-12-04 NOTE — Assessment & Plan Note (Signed)
 Injection given today and does seem to be responding.  Has given him some increase in energy.

## 2023-12-04 NOTE — Assessment & Plan Note (Signed)
 Stable but we need to continue to monitor.

## 2023-12-04 NOTE — Assessment & Plan Note (Signed)
 Once weekly prescription given for the next 6 months

## 2023-12-04 NOTE — Assessment & Plan Note (Signed)
 Continued hematuria with history of prostate referral to urooncology placed for second opinion patient's request.

## 2023-12-04 NOTE — Patient Instructions (Addendum)
 Once weekly Vit D Tart cherry 3600mg  at night Yes we can spread out your mom's injections [facility]: Richerd Brigham, MD  Harlene An, MD [phone] Will look into second opinion for uroncology B12 injection today.  See me in 3 months

## 2023-12-05 DIAGNOSIS — R311 Benign essential microscopic hematuria: Secondary | ICD-10-CM | POA: Diagnosis not present

## 2023-12-05 DIAGNOSIS — C61 Malignant neoplasm of prostate: Secondary | ICD-10-CM | POA: Diagnosis not present

## 2023-12-05 DIAGNOSIS — R300 Dysuria: Secondary | ICD-10-CM | POA: Diagnosis not present

## 2023-12-09 ENCOUNTER — Other Ambulatory Visit (HOSPITAL_COMMUNITY): Payer: Self-pay | Admitting: Adult Health

## 2023-12-09 DIAGNOSIS — R310 Gross hematuria: Secondary | ICD-10-CM

## 2023-12-13 DIAGNOSIS — R310 Gross hematuria: Secondary | ICD-10-CM | POA: Diagnosis not present

## 2023-12-16 DIAGNOSIS — G8929 Other chronic pain: Secondary | ICD-10-CM | POA: Diagnosis not present

## 2023-12-16 DIAGNOSIS — C61 Malignant neoplasm of prostate: Secondary | ICD-10-CM | POA: Diagnosis not present

## 2023-12-16 DIAGNOSIS — R102 Pelvic and perineal pain: Secondary | ICD-10-CM | POA: Diagnosis not present

## 2023-12-16 DIAGNOSIS — Z8546 Personal history of malignant neoplasm of prostate: Secondary | ICD-10-CM | POA: Diagnosis not present

## 2023-12-16 DIAGNOSIS — R300 Dysuria: Secondary | ICD-10-CM | POA: Diagnosis not present

## 2023-12-16 DIAGNOSIS — G43701 Chronic migraine without aura, not intractable, with status migrainosus: Secondary | ICD-10-CM | POA: Diagnosis not present

## 2023-12-16 DIAGNOSIS — E559 Vitamin D deficiency, unspecified: Secondary | ICD-10-CM | POA: Diagnosis not present

## 2023-12-20 DIAGNOSIS — R310 Gross hematuria: Secondary | ICD-10-CM | POA: Diagnosis not present

## 2023-12-25 ENCOUNTER — Ambulatory Visit: Admitting: Licensed Clinical Social Worker

## 2023-12-27 DIAGNOSIS — R102 Pelvic and perineal pain: Secondary | ICD-10-CM | POA: Diagnosis not present

## 2023-12-27 DIAGNOSIS — R300 Dysuria: Secondary | ICD-10-CM | POA: Diagnosis not present

## 2023-12-27 DIAGNOSIS — G8929 Other chronic pain: Secondary | ICD-10-CM | POA: Diagnosis not present

## 2023-12-27 DIAGNOSIS — Z8546 Personal history of malignant neoplasm of prostate: Secondary | ICD-10-CM | POA: Diagnosis not present

## 2024-01-01 NOTE — Progress Notes (Unsigned)
 [name] [address] [place] [address] Phone: [phone] Subjective:   William [name] am a scribe for Dr. Claudene.   I'm seeing this patient by the request  of:  William Righter, MD  CC: Back pain follow-up  YEP:Dlagzrupcz  12/04/2023 Once weekly prescription given for the next 6 months     Continued hematuria with history of prostate referral to urooncology placed for second opinion patient's request.     Stable but we need to continue to monitor.     Injection given today and does seem to be responding.  Has given him some increase in energy     Known arthritic changes that I think is contributing to some of the aches and pains.  Discussed icing regimen and exercises, which activities to do in which ones to avoid.  Increase activity slowly.  Discussed icing regimen.  Follow-up again in 6 to 8 weeks total time with patient today discussing other modalities, 3 months.     Update 01/02/2024 William [name] is a 63 y.o. male coming in with complaint of cervical and lumbar spine pain. Patient states he wants to talk to the doctor about cystoscopy and Dr. Terence Urologist. Neck and back are doing pretty good. Patient thinks that it is 90% resolved.  Never went and saw provider.      Past Medical History:  Diagnosis Date   Allergy    Anxiety    Chronic kidney disease    Depression    Diverticulitis    Gastroparesis    Heart murmur    Migraine headache    Mitral valve prolapse    SVT (supraventricular tachycardia) (HCC)    Past Surgical History:  Procedure Laterality Date   COLONOSCOPY W/ BIOPSIES  2023   RADIOACTIVE SEED IMPLANT N/A 01/19/2022   Procedure: RADIOACTIVE SEED IMPLANT/BRACHYTHERAPY IMPLANT;  Surgeon: Watt Rush, MD;  Location: [facility];  Service: Urology;  Laterality: N/A;   SPACE OAR INSTILLATION N/A 01/19/2022   Procedure: SPACE OAR INSTILLATION;  Surgeon: Watt Rush, MD;  Location:  [facility];  Service: Urology;  Laterality: N/A;   Social History   Socioeconomic History   Marital status: Married    Spouse name: Not on file   Number of children: 0   Years of education: Not on file   Highest education level: Bachelor's degree (e.g., BA, AB, BS)  Occupational History   Not on file  Tobacco Use   Smoking status: Never   Smokeless tobacco: Never  Vaping Use   Vaping status: Never Used  Substance and Sexual Activity   Alcohol use: No    Alcohol/week: 0.0 standard drinks of alcohol   Drug use: No   Sexual activity: Yes    Partners: Female  Other Topics Concern   Not on file  Social History Narrative   Pt is married lives with spouse in 1 story apartment he has No children   BS degree- he is right handed   Drinks soda  Ginger ale, no coffee no tea   Right handed   Social Drivers of Corporate investment banker Strain: Low Risk  (10/25/2021)   Overall Financial Resource Strain (CARDIA)    Difficulty of Paying Living Expenses: Not hard at all  Food Insecurity: Low Risk  (12/16/2023)   Received from [facility]   Hunger Vital Sign    Within the past 12 months, you worried that your food would run out before  you got money to buy more: Never true    Within the past 12 months, the food you bought just didn't last and you didn't have money to get more. : Never true  Transportation Needs: No Transportation Needs (12/16/2023)   Received from [employer]    In the past 12 months, has lack of reliable transportation kept you from medical appointments, meetings, work or from getting things needed for daily living? : No  Physical Activity: Not on file  Stress: Stress Concern Present (10/25/2021)   [employer] of Occupational Health - Occupational Stress Questionnaire    Feeling of Stress : Rather much  Social Connections: Not on file   Allergies  Allergen Reactions   Erythromycin Anaphylaxis and Nausea And Vomiting    Nalbuphine Other (See Comments)    States loss of consciousness    Cefdinir  Diarrhea, Nausea And Vomiting and Rash   Cephalexin Diarrhea and Nausea And Vomiting   Cephalosporins Diarrhea and Nausea And Vomiting   Hydrocodone -Acetaminophen  Nausea And Vomiting   Ketorolac  Nausea And Vomiting    Other reaction(s): upset stomach   Prochlorperazine Edisylate Other (See Comments)    uncontrollable shake - tremor    Restasis  [Cyclosporine ] Other (See Comments)    migraines   Tyloxapol Swelling   Bacitracin-Polymyxin B     Other reaction(s): rash gets worse Other reaction(s): rash gets worse   Codeine  Other (See Comments)    Other reaction(s): stomach upset   Nalbuphine Hcl     Other reaction(s): stomach upset   Prochlorperazine     Other reaction(s): tremors/vomiting   Propranolol  Diarrhea   Rosuvastatin  Diarrhea   Topamax [Topiramate] Diarrhea and Nausea And Vomiting   Zetia  [Ezetimibe ] Diarrhea   Cefdinir  Diarrhea, Nausea And Vomiting and Rash    Other reaction(s): too stronge   Doxycycline Hyclate Nausea Only and Other (See Comments)    abd pain   Iodinated Contrast Media Other (See Comments)    Lightheaded, flushed feeling (explained to patient these are normal side effects of IV iodinated contrast, not allergies, but he wanted this noted in the epic; he tolerated epidural steroid injections w/ contrast w/o difficulty)   Ketoconazole  Itching   Ketorolac  Tromethamine  Rash    REACTION: Reaction not known   Latex Itching and Rash   Neosporin [Neomycin-Bacitracin Zn-Polymyx] Rash   Oxycodone -Acetaminophen  Other (See Comments)    Tylox caused constipation   Prednisone  Nausea And Vomiting    Oral route, only Other reaction(s): stomach spasms   Family History  Problem Relation Age of Onset   Hypertension Mother    Lung cancer Father 84 - 60   Hypertension Brother    Kidney failure Maternal Grandmother    Prostate cancer Neg Hx    Kidney cancer Neg Hx     Current  Outpatient Medications (Endocrine & Metabolic):    predniSONE  (DELTASONE ) 20 MG tablet, Take 2 tablets (40 mg total) by mouth daily with breakfast.  Current Outpatient Medications (Cardiovascular):    diltiazem  (CARDIZEM  CD) 120 MG 24 hr capsule, Take 1 capsule (120 mg total) by mouth daily.   diltiazem  (CARDIZEM ) 30 MG tablet, TAKE (1) TABLET AS NEEDED FOR PALPITATIONS   Evolocumab  (REPATHA  SURECLICK) 140 MG/ML SOAJ, Inject 140 mg into the skin every 14 (fourteen) days.  Current Outpatient Medications (Respiratory):    fluticasone  (FLONASE ) 50 MCG/ACT nasal spray, Place 2 sprays into both nostrils daily.   promethazine  (PHENERGAN ) 25 MG tablet, Take 25 mg by mouth every 4 (four) hours  as needed for nausea.  Current Outpatient Medications (Analgesics):    Galcanezumab -gnlm (EMGALITY ) 120 MG/ML SOAJ, Inject 120 mg as directed every 28 (twenty-eight) days. INJECT 120MG  (1 PEN) UNDER THE SKIN EVERY MONTH   Rimegepant Sulfate (NURTEC) 75 MG TBDP, Take 1 tablet (75 mg total) by mouth as needed for up to 1 dose (Maximum 1 tablet in 24 hours.).   Current Outpatient Medications (Other):    amoxicillin  (AMOXIL ) 500 MG tablet, Take 1 tablet (500 mg total) by mouth 2 (two) times daily.   hydrocortisone (ANUSOL-HC) 2.5 % rectal cream, SMARTSIG:Rectally 3 Times Daily PRN   hydrOXYzine (ATARAX) 50 MG tablet, Take 25-50 mg by mouth 2 (two) times daily as needed for itching.   metoCLOPramide  (REGLAN ) 5 MG tablet, Take 5 mg by mouth as needed.   ondansetron  (ZOFRAN -ODT) 8 MG disintegrating tablet, Take 0.5 tablets (4 mg total) by mouth every 8 (eight) hours as needed for nausea.   polyethylene glycol powder (GLYCOLAX /MIRALAX ) 17 GM/SCOOP powder, Take 17 g by mouth as needed for moderate constipation.   silodosin (RAPAFLO) 8 MG CAPS capsule, Take 8 mg by mouth daily with breakfast.   traZODone  (DESYREL ) 50 MG tablet, Take 1 tablet (50 mg total) by mouth at bedtime as needed for sleep.   Vitamin D ,  Ergocalciferol , (DRISDOL ) 1.25 MG (50000 UNIT) CAPS capsule, Take 1 capsule (50,000 Units total) by mouth every 7 (seven) days.   Reviewed prior external information including notes and imaging from  primary care provider As well as notes that were available from care everywhere and other healthcare systems.  Past medical history, social, surgical and family history all reviewed in electronic medical record.  No pertanent information unless stated regarding to the chief complaint.   Review of Systems:  No headache, visual changes, nausea, vomiting, diarrhea, constipation, dizziness, abdominal pain, skin rash, fevers, chills, night sweats, weight loss, swollen lymph nodes, body aches, joint swelling, chest pain, shortness of breath, mood changes. POSITIVE muscle aches, dysuria  Objective  Blood pressure 130/60, pulse 79, height 5' 10 (1.778 m), weight 150 lb 12.8 oz (68.4 kg), SpO2 97%.   General: No apparent distress alert and oriented x3 mood and affect normal, dressed appropriately.  HEENT: Pupils equal, extraocular movements intact  Respiratory: Patient's speak in full sentences and does not appear short of breath  Cardiovascular: No lower extremity edema, non tender, no erythema  Patient is sitting relatively comfortably.  Does still to be somewhat anxious.   Impression and Recommendations:    The above documentation has been reviewed and is accurate and complete Gennette Shadix M Kaylena Pacifico, DO

## 2024-01-02 ENCOUNTER — Ambulatory Visit: Admitting: Family Medicine

## 2024-01-02 ENCOUNTER — Ambulatory Visit: Payer: Self-pay | Admitting: Family Medicine

## 2024-01-02 ENCOUNTER — Encounter: Payer: Self-pay | Admitting: Family Medicine

## 2024-01-02 ENCOUNTER — Ambulatory Visit: Admitting: Licensed Clinical Social Worker

## 2024-01-02 ENCOUNTER — Ambulatory Visit: Admitting: Pharmacist Clinician (PhC)/ Clinical Pharmacy Specialist

## 2024-01-02 VITALS — BP 130/60 | HR 79 | Ht 70.0 in | Wt 150.8 lb

## 2024-01-02 DIAGNOSIS — M6289 Other specified disorders of muscle: Secondary | ICD-10-CM | POA: Diagnosis not present

## 2024-01-02 DIAGNOSIS — E559 Vitamin D deficiency, unspecified: Secondary | ICD-10-CM

## 2024-01-02 DIAGNOSIS — M2550 Pain in unspecified joint: Secondary | ICD-10-CM | POA: Diagnosis not present

## 2024-01-02 DIAGNOSIS — E538 Deficiency of other specified B group vitamins: Secondary | ICD-10-CM | POA: Diagnosis not present

## 2024-01-02 DIAGNOSIS — R109 Unspecified abdominal pain: Secondary | ICD-10-CM

## 2024-01-02 DIAGNOSIS — C61 Malignant neoplasm of prostate: Secondary | ICD-10-CM | POA: Diagnosis not present

## 2024-01-02 LAB — URIC ACID: Uric Acid, Serum: 5.3 mg/dL (ref 4.0–7.8)

## 2024-01-02 LAB — PSA: PSA: 1.69 ng/mL (ref 0.10–4.00)

## 2024-01-02 MED ORDER — CYANOCOBALAMIN 1000 MCG/ML IJ SOLN
1000.0000 ug | Freq: Once | INTRAMUSCULAR | Status: AC
  Administered 2024-01-02: 1000 ug via INTRAMUSCULAR

## 2024-01-02 NOTE — Assessment & Plan Note (Signed)
 Continue supplementation  ?

## 2024-01-02 NOTE — Assessment & Plan Note (Signed)
 Continue to work with strengthening exercises.

## 2024-01-02 NOTE — Patient Instructions (Addendum)
 Good to see you. B12 injection today. UA lab today. Psa lab in the future.  If any blood in urine we might go with the cystotomy, but they can give you medication.  See me again in 2 to 3 months.

## 2024-01-02 NOTE — Assessment & Plan Note (Signed)
 Continues to have intermittent pain overall.  Chronic pelvic pain from all of the different interventions patient has had as well.  Discussed icing regimen and home exercises, discussed which activities to do and which ones to avoid.  Increase slowly.  Follow-up with me again in 6 to 8 weeks.  Discussed with patient if continuing to have hematuria and the cystoscopy that was potentially what urology would like to do would be even more beneficial aspect.  Discussed with patient about other signs and symptoms and when to seek medical attention.  Will get PSA before he sees the urologist as well.  Follow-up with me again 6 to 8 weeks.

## 2024-01-08 ENCOUNTER — Ambulatory Visit

## 2024-01-13 DIAGNOSIS — F419 Anxiety disorder, unspecified: Secondary | ICD-10-CM | POA: Diagnosis not present

## 2024-01-13 DIAGNOSIS — E538 Deficiency of other specified B group vitamins: Secondary | ICD-10-CM | POA: Diagnosis not present

## 2024-01-13 DIAGNOSIS — G8929 Other chronic pain: Secondary | ICD-10-CM | POA: Diagnosis not present

## 2024-01-13 DIAGNOSIS — Z8546 Personal history of malignant neoplasm of prostate: Secondary | ICD-10-CM | POA: Diagnosis not present

## 2024-01-13 DIAGNOSIS — R102 Pelvic and perineal pain: Secondary | ICD-10-CM | POA: Diagnosis not present

## 2024-01-13 DIAGNOSIS — E785 Hyperlipidemia, unspecified: Secondary | ICD-10-CM | POA: Diagnosis not present

## 2024-01-14 ENCOUNTER — Ambulatory Visit: Admitting: Licensed Clinical Social Worker

## 2024-01-15 DIAGNOSIS — N182 Chronic kidney disease, stage 2 (mild): Secondary | ICD-10-CM | POA: Diagnosis not present

## 2024-01-15 DIAGNOSIS — C61 Malignant neoplasm of prostate: Secondary | ICD-10-CM | POA: Diagnosis not present

## 2024-01-15 DIAGNOSIS — I471 Supraventricular tachycardia, unspecified: Secondary | ICD-10-CM | POA: Diagnosis not present

## 2024-01-15 DIAGNOSIS — G43719 Chronic migraine without aura, intractable, without status migrainosus: Secondary | ICD-10-CM | POA: Diagnosis not present

## 2024-01-16 ENCOUNTER — Ambulatory Visit: Admitting: Licensed Clinical Social Worker

## 2024-01-16 DIAGNOSIS — F411 Generalized anxiety disorder: Secondary | ICD-10-CM | POA: Diagnosis not present

## 2024-01-16 DIAGNOSIS — C61 Malignant neoplasm of prostate: Secondary | ICD-10-CM | POA: Diagnosis not present

## 2024-01-16 DIAGNOSIS — R3129 Other microscopic hematuria: Secondary | ICD-10-CM | POA: Diagnosis not present

## 2024-01-21 ENCOUNTER — Other Ambulatory Visit

## 2024-01-21 ENCOUNTER — Ambulatory Visit: Payer: Self-pay | Admitting: Family Medicine

## 2024-01-21 DIAGNOSIS — M2550 Pain in unspecified joint: Secondary | ICD-10-CM | POA: Diagnosis not present

## 2024-01-21 LAB — PSA: PSA: 0.84 ng/mL (ref 0.10–4.00)

## 2024-01-23 DIAGNOSIS — N1831 Chronic kidney disease, stage 3a: Secondary | ICD-10-CM | POA: Diagnosis not present

## 2024-01-24 ENCOUNTER — Telehealth: Payer: Self-pay | Admitting: Family Medicine

## 2024-01-24 NOTE — Telephone Encounter (Signed)
 Results faxed.

## 2024-01-24 NOTE — Telephone Encounter (Signed)
 Patient called and said his PSA labs were supposed to be sent to DR. Wrenn at [facility] urology. He provided fax number because they did not receive them. He goes Monday for an appt.  : [phone]. Please advise.

## 2024-01-27 DIAGNOSIS — R351 Nocturia: Secondary | ICD-10-CM | POA: Diagnosis not present

## 2024-01-27 DIAGNOSIS — N401 Enlarged prostate with lower urinary tract symptoms: Secondary | ICD-10-CM | POA: Diagnosis not present

## 2024-01-27 DIAGNOSIS — N3010 Interstitial cystitis (chronic) without hematuria: Secondary | ICD-10-CM | POA: Diagnosis not present

## 2024-01-27 DIAGNOSIS — R311 Benign essential microscopic hematuria: Secondary | ICD-10-CM | POA: Diagnosis not present

## 2024-01-27 DIAGNOSIS — Z8546 Personal history of malignant neoplasm of prostate: Secondary | ICD-10-CM | POA: Diagnosis not present

## 2024-02-10 NOTE — Progress Notes (Unsigned)
 [facility] [address] [place] [address] Phone: [phone] Subjective:   LILLETTE Berwyn Posey, am serving as a scribe for Dr. Arthea Claudene.  I'm seeing this patient by the request  of:  Kip Righter, MD  CC: Pelvic pain, back pain multiple other issues  YEP:Dlagzrupcz  01/02/2024 Continue to work with strengthening exercises.     Continues to have intermittent pain overall.  Chronic pelvic pain from all of the different interventions patient has had as well.  Discussed icing regimen and home exercises, discussed which activities to do and which ones to avoid.  Increase slowly.  Follow-up with me again in 6 to 8 weeks.  Discussed with patient if continuing to have hematuria and the cystoscopy that was potentially what urology would like to do would be even more beneficial aspect.  Discussed with patient about other signs and symptoms and when to seek medical attention.  Will get PSA before he sees the urologist as well.  Follow-up with me again 6 to 8 weeks.     Update 02/11/2024 William [name] is a 63 y.o. male coming in with complaint of pelvis pain. Patient states that    PSA is downtrending    Past Medical History:  Diagnosis Date   Allergy    Anxiety    Chronic kidney disease    Depression    Diverticulitis    Gastroparesis    Heart murmur    Migraine headache    Mitral valve prolapse    SVT (supraventricular tachycardia)    Past Surgical History:  Procedure Laterality Date   COLONOSCOPY W/ BIOPSIES  2023   RADIOACTIVE SEED IMPLANT N/A 01/19/2022   Procedure: RADIOACTIVE SEED IMPLANT/BRACHYTHERAPY IMPLANT;  Surgeon: Watt Rush, MD;  Location: [facility];  Service: Urology;  Laterality: N/A;   SPACE OAR INSTILLATION N/A 01/19/2022   Procedure: SPACE OAR INSTILLATION;  Surgeon: Watt Rush, MD;  Location: [facility];  Service: Urology;  Laterality: N/A;   Social History   Socioeconomic  History   Marital status: Married    Spouse name: Not on file   Number of children: 0   Years of education: Not on file   Highest education level: Bachelor's degree (e.g., BA, AB, BS)  Occupational History   Not on file  Tobacco Use   Smoking status: Never   Smokeless tobacco: Never  Vaping Use   Vaping status: Never Used  Substance and Sexual Activity   Alcohol use: No    Alcohol/week: 0.0 standard drinks of alcohol   Drug use: No   Sexual activity: Yes    Partners: Female  Other Topics Concern   Not on file  Social History Narrative   Pt is married lives with spouse in 1 story apartment he has No children   BS degree- he is right handed   Drinks soda  Ginger ale, no coffee no tea   Right handed   Social Drivers of Corporate investment banker Strain: Low Risk  (10/25/2021)   Overall Financial Resource Strain (CARDIA)    Difficulty of Paying Living Expenses: Not hard at all  Food Insecurity: Low Risk  (12/16/2023)   Received from [facility]   Hunger Vital Sign    Within the past 12 months, you worried that your food would run out before you got money to buy more: Never true    Within the past 12 months, the food you bought just didn't last and  you didn't have money to get more. : Never true  Transportation Needs: No Transportation Needs (12/16/2023)   Received from [facility]   Transportation    In the past 12 months, has lack of reliable transportation kept you from medical appointments, meetings, work or from getting things needed for daily living? : No  Physical Activity: Not on file  Stress: Stress Concern Present (10/25/2021)   [employer] of Occupational Health - Occupational Stress Questionnaire    Feeling of Stress : Rather much  Social Connections: Not on file   Allergies  Allergen Reactions   Erythromycin Anaphylaxis and Nausea And Vomiting   Nalbuphine Other (See Comments)    States loss of consciousness    Cefdinir  Diarrhea, Nausea And Vomiting  and Rash   Cephalexin Diarrhea and Nausea And Vomiting   Cephalosporins Diarrhea and Nausea And Vomiting   Hydrocodone -Acetaminophen  Nausea And Vomiting   Ketorolac  Nausea And Vomiting    Other reaction(s): upset stomach   Prochlorperazine Edisylate Other (See Comments)    uncontrollable shake - tremor    Restasis  [Cyclosporine ] Other (See Comments)    migraines   Tyloxapol Swelling   Bacitracin-Polymyxin B     Other reaction(s): rash gets worse Other reaction(s): rash gets worse   Codeine  Other (See Comments)    Other reaction(s): stomach upset   Nalbuphine Hcl     Other reaction(s): stomach upset   Prochlorperazine     Other reaction(s): tremors/vomiting   Propranolol  Diarrhea   Rosuvastatin  Diarrhea   Topamax [Topiramate] Diarrhea and Nausea And Vomiting   Zetia  [Ezetimibe ] Diarrhea   Cefdinir  Diarrhea, Nausea And Vomiting and Rash    Other reaction(s): too stronge   Doxycycline Hyclate Nausea Only and Other (See Comments)    abd pain   Iodinated Contrast Media Other (See Comments)    Lightheaded, flushed feeling (explained to patient these are normal side effects of IV iodinated contrast, not allergies, but he wanted this noted in the epic; he tolerated epidural steroid injections w/ contrast w/o difficulty)   Ketoconazole  Itching   Ketorolac  Tromethamine  Rash    REACTION: Reaction not known   Latex Itching and Rash   Neosporin [Neomycin-Bacitracin Zn-Polymyx] Rash   Oxycodone -Acetaminophen  Other (See Comments)    Tylox caused constipation   Prednisone  Nausea And Vomiting    Oral route, only Other reaction(s): stomach spasms   Family History  Problem Relation Age of Onset   Hypertension Mother    Lung cancer Father 44 - 38   Hypertension Brother    Kidney failure Maternal Grandmother    Prostate cancer Neg Hx    Kidney cancer Neg Hx     Current Outpatient Medications (Endocrine & Metabolic):    predniSONE  (DELTASONE ) 20 MG tablet, Take 2 tablets (40 mg total)  by mouth daily with breakfast.  Current Outpatient Medications (Cardiovascular):    diltiazem  (CARDIZEM  CD) 120 MG 24 hr capsule, Take 1 capsule (120 mg total) by mouth daily.   diltiazem  (CARDIZEM ) 30 MG tablet, TAKE (1) TABLET AS NEEDED FOR PALPITATIONS   Evolocumab  (REPATHA  SURECLICK) 140 MG/ML SOAJ, Inject 140 mg into the skin every 14 (fourteen) days.  Current Outpatient Medications (Respiratory):    fluticasone  (FLONASE ) 50 MCG/ACT nasal spray, Place 2 sprays into both nostrils daily.   promethazine  (PHENERGAN ) 25 MG tablet, Take 25 mg by mouth every 4 (four) hours as needed for nausea.  Current Outpatient Medications (Analgesics):    Galcanezumab -gnlm (EMGALITY ) 120 MG/ML SOAJ, Inject 120 mg as directed every 28 (  twenty-eight) days. INJECT 120MG  (1 PEN) UNDER THE SKIN EVERY MONTH   Rimegepant Sulfate (NURTEC) 75 MG TBDP, Take 1 tablet (75 mg total) by mouth as needed for up to 1 dose (Maximum 1 tablet in 24 hours.).   Current Outpatient Medications (Other):    amoxicillin  (AMOXIL ) 500 MG tablet, Take 1 tablet (500 mg total) by mouth 2 (two) times daily.   hydrocortisone (ANUSOL-HC) 2.5 % rectal cream, SMARTSIG:Rectally 3 Times Daily PRN   hydrOXYzine (ATARAX) 50 MG tablet, Take 25-50 mg by mouth 2 (two) times daily as needed for itching.   metoCLOPramide  (REGLAN ) 5 MG tablet, Take 5 mg by mouth as needed.   ondansetron  (ZOFRAN -ODT) 8 MG disintegrating tablet, Take 0.5 tablets (4 mg total) by mouth every 8 (eight) hours as needed for nausea.   polyethylene glycol powder (GLYCOLAX /MIRALAX ) 17 GM/SCOOP powder, Take 17 g by mouth as needed for moderate constipation.   silodosin (RAPAFLO) 8 MG CAPS capsule, Take 8 mg by mouth daily with breakfast.   traZODone  (DESYREL ) 50 MG tablet, Take 1 tablet (50 mg total) by mouth at bedtime as needed for sleep.   Vitamin D , Ergocalciferol , (DRISDOL ) 1.25 MG (50000 UNIT) CAPS capsule, Take 1 capsule (50,000 Units total) by mouth every 7 (seven)  days.   Reviewed prior external information including notes and imaging from  primary care provider As well as notes that were available from care everywhere and other healthcare systems.  Past medical history, social, surgical and family history all reviewed in electronic medical record.  No pertanent information unless stated regarding to the chief complaint.   Review of Systems:  No headache, visual changes, nausea, vomiting, diarrhea, constipation, dizziness, abdominal pain, skin rash, fevers, chills, night sweats, weight loss, swollen lymph nodes, body aches, joint swelling, chest pain, shortness of breath, mood changes. POSITIVE muscle aches  Objective  There were no vitals taken for this visit.   General: No apparent distress alert and oriented x3 mood and affect normal, dressed appropriately.  HEENT: Pupils equal, extraocular movements intact  Respiratory: Patient's speak in full sentences and does not appear short of breath  Cardiovascular: No lower extremity edema, non tender, no erythema  Right hip exam shows nontender on exam sitting comfortably at this time.    Impression and Recommendations:     The above documentation has been reviewed and is accurate and complete Exodus Kutzer M Tashica Provencio, DO

## 2024-02-11 ENCOUNTER — Ambulatory Visit (INDEPENDENT_AMBULATORY_CARE_PROVIDER_SITE_OTHER): Admitting: Family Medicine

## 2024-02-11 ENCOUNTER — Encounter: Payer: Self-pay | Admitting: Family Medicine

## 2024-02-11 VITALS — BP 108/72 | HR 94 | Ht 70.0 in | Wt 152.0 lb

## 2024-02-11 DIAGNOSIS — C61 Malignant neoplasm of prostate: Secondary | ICD-10-CM | POA: Diagnosis not present

## 2024-02-11 DIAGNOSIS — M6289 Other specified disorders of muscle: Secondary | ICD-10-CM

## 2024-02-11 DIAGNOSIS — E538 Deficiency of other specified B group vitamins: Secondary | ICD-10-CM | POA: Diagnosis not present

## 2024-02-11 MED ORDER — CYANOCOBALAMIN 1000 MCG/ML IJ SOLN
1000.0000 ug | Freq: Once | INTRAMUSCULAR | Status: AC
  Administered 2024-02-11: 1000 ug via INTRAMUSCULAR

## 2024-02-11 NOTE — Assessment & Plan Note (Signed)
 Have done extremely well after physical therapy.

## 2024-02-11 NOTE — Patient Instructions (Signed)
 B12 injection today See me after the new year

## 2024-02-11 NOTE — Assessment & Plan Note (Signed)
 B12 injection given today and repeat monthly

## 2024-02-11 NOTE — Assessment & Plan Note (Signed)
 Downtrending labs patient is at a good area with no significant pain.  No change in medication, follow-up with me again 3 to 4 months

## 2024-02-11 NOTE — Addendum Note (Signed)
 Addended by: TONNIE SHU D on: 02/11/2024 10:09 AM   Modules accepted: Orders

## 2024-02-19 ENCOUNTER — Other Ambulatory Visit: Payer: Self-pay | Admitting: Neurology

## 2024-02-19 DIAGNOSIS — E7800 Pure hypercholesterolemia, unspecified: Secondary | ICD-10-CM | POA: Diagnosis not present

## 2024-02-19 DIAGNOSIS — R300 Dysuria: Secondary | ICD-10-CM | POA: Diagnosis not present

## 2024-02-26 DIAGNOSIS — K3184 Gastroparesis: Secondary | ICD-10-CM | POA: Diagnosis not present

## 2024-02-26 DIAGNOSIS — E785 Hyperlipidemia, unspecified: Secondary | ICD-10-CM | POA: Diagnosis not present

## 2024-02-26 DIAGNOSIS — Z713 Dietary counseling and surveillance: Secondary | ICD-10-CM | POA: Diagnosis not present

## 2024-02-26 DIAGNOSIS — R5382 Chronic fatigue, unspecified: Secondary | ICD-10-CM | POA: Diagnosis not present

## 2024-02-26 DIAGNOSIS — E559 Vitamin D deficiency, unspecified: Secondary | ICD-10-CM | POA: Diagnosis not present

## 2024-03-05 ENCOUNTER — Telehealth: Payer: Self-pay

## 2024-03-05 NOTE — Telephone Encounter (Signed)
 Letter Received from [facility] renewal application for [employer] .   Forms filled out on Dr.Jaffe Desk.

## 2024-03-09 DIAGNOSIS — E785 Hyperlipidemia, unspecified: Secondary | ICD-10-CM | POA: Diagnosis not present

## 2024-03-18 DIAGNOSIS — E785 Hyperlipidemia, unspecified: Secondary | ICD-10-CM | POA: Diagnosis not present

## 2024-03-18 DIAGNOSIS — R059 Cough, unspecified: Secondary | ICD-10-CM | POA: Diagnosis not present

## 2024-03-18 DIAGNOSIS — I471 Supraventricular tachycardia, unspecified: Secondary | ICD-10-CM | POA: Diagnosis not present

## 2024-03-18 DIAGNOSIS — C61 Malignant neoplasm of prostate: Secondary | ICD-10-CM | POA: Diagnosis not present

## 2024-03-18 DIAGNOSIS — F411 Generalized anxiety disorder: Secondary | ICD-10-CM | POA: Diagnosis not present

## 2024-03-18 DIAGNOSIS — E559 Vitamin D deficiency, unspecified: Secondary | ICD-10-CM | POA: Diagnosis not present

## 2024-04-01 ENCOUNTER — Ambulatory Visit: Admitting: Family Medicine

## 2024-04-06 ENCOUNTER — Telehealth: Payer: Self-pay | Admitting: Neurology

## 2024-04-06 NOTE — Telephone Encounter (Signed)
 LMOVM and Mychart message sent to patient.

## 2024-04-06 NOTE — Telephone Encounter (Signed)
 [employer]  App Rep On the Dr.'s app, they will need physical adrs and DOB of Pt missing from our part of [employer]  app. Reached out to Pt, have not rcvd his Application

## 2024-04-07 ENCOUNTER — Ambulatory Visit: Payer: PPO | Admitting: Neurology

## 2024-04-14 NOTE — Progress Notes (Deleted)
 NEUROLOGY FOLLOW UP OFFICE NOTE  William [name] [id number]  Assessment/Plan:   1.  Migraine without aura, without status migrainosus, not intractable - increased frequency. 2.  Left lumbosacral radiculopathy/radiculitis 3.  Essential tremor - very mild   1.  Migraine prevention:  Emgality .  If he continues to have trouble getting the medication, consider changing to Botox 2.  Migraine rescue:  Nurtec and Zofran   3.  Limit use of pain relievers to no more than 2 days out of week to prevent risk of rebound or medication-overuse headache. 4.  Keep headache diary 5.  Monitor tremor  6.  Follow up one year or sooner if needed.   Subjective:  William [name] is a 63 year old right-handed male with hyperlipidemia, MVP, IBS, gastroparesis, generalized anxiety disorder and palpitations who follows up for migraines and tremor.   UPDATE: Migraines: On Emgality  with help from the [facility].  He is doing well.  *** Intensity:  Moderate to severe Duration:  Less than 15 minutes with Nurtec Frequency: 1 to 2 a month  Frequency of abortive medication: 0 days in past 30 days   Tremor: Stable.  Manageable.  Left lower extremity pain: Followed by pain management.  Had a lumbar epidural last December ***   Current medications: Current NSAIDS:  none Current analgesics:  None Current triptans:  Contraindicated (MVP) Current ergotamine:  none Current anti-emetic:  Zofran  4mg  Current muscle relaxants:  baclofen  10mg  TID Current anti-anxiolytic:  none Current sleep aide:  none Current Antihypertensive medications:  Cardizem  Current Antidepressant medications:  amitriptyline 25mg  at bedtime Current Anticonvulsant medications:  none Current anti-CGRP: Emgality ; Nurtec Current Vitamins/Herbal/Supplements:  D Current Antihistamines/Decongestants:  Claritin , Flonase  Other therapy:  none   Caffeine :  No coffee, tea or caffeinated soda Diet:  Needs to improve.  Sometimes ginger  ale Exercise:  No Depression:  no; Anxiety:  Yes.  stress related to prostate cancer diagnosis Other pain:  no Sleep hygiene:  Poor.  Stress related to caregiver for his mother   HISTORY: Migraines:  Onset: 1986.   Location: 90% of time: Left retro-orbital region, 10% bi-frontal Quality:  stabbing Initial intensity: Fluctuates from 4/10-10/10 Aura:  no Prodrome:  no Associated symptoms: Lightheadedness, fatigue, nausea, tearing of left eye. Initial duration: 4 hours Initial frequency: 4 to 5 times a week Triggers/aggravating factors: Afrin, odors (perfumes, cigarette, chinese food), MSG, salt, stress, extreme temperatures Relieving factors: Water  Activity:  About 4 to 5 days a week, cannot function  Tremor: He also reports longstanding history of tremor.  It involves the left hand.  It occurs with use, not at rest.  Over the past 10 years, it has gotten a little worse.  He does report increased emotional stress and poor sleep.  No problems with walking.  No family history of tremor.   Left lower extremity pain: History of chronic pain followed by pain management, including history of left leg pain off and on.  Injections in the back previously have been helpful.  Recurrence 4-5 weeks ago.  Burning/pins and needles on bottom of left feet and radiates up the back and side of the leg to the left lower back.  However, now he has pain on urination off and on since the seed implant last year.  Always tests negative for UTI.  Thought to be neuralgia.  MRI of lumbar spine on 03/23/2023 revealed disc degeneration with shallow disc protrusion and moderate bilateral foraminal encroachment at L4-5 and L5-S1 possibly effecting either L5 nerve  root.  Urology has prescribed amitriptyline for the urinary pain.   Past medications: Past NSAIDS: Indomethacin Past analgesics: Nucynta, lidocaine  patches, tramadol , nalbuphine, Tylox, bupivacaine injections, hydrocodone , morphine, Tylenol  #2, Tylenol  Past  abortive triptans:  Sumatriptan po/Cumming, Maxalt, Relpax, Amerge, Zomig Past abortive ergotamine:  Migranal, DHE protocol Past muscle relaxants:  Robaxin , Flexeril  Past anti-emetic:  Reglan , Phenergan  Past anxiolytics:  Temazeam, clonazepam  Past antihypertensive medications:  Inderal , metoprolol, verapamil Past antidepressant medications:  Nortriptyline, amitriptyline, venlafaxine, Wellbutrin, Remeron  Past anticonvulsant medications:  Depakote, topiramate (weight loss, diarrhea), Lanice Saupe, gabapentin  Past anti-CGRP:  none Past vitamins/Herbal/Supplements:  petasites Past antihistamines/decongestants:  none Other past therapies:  Biofeedback, acupuncture, counseling     He has had multiple imaging of the head, all unremarkable, including:    CT HEAD from 06/01/09 and 07/30/09. MRI BRAIN W/WO from 12/08/02, 01/19/05, 04/18/12.  MRI of brain ordered by me from 11/19/2015 was personally reviewed and was normal.   CERVICAL XR from 01/15/08 showed mild degenerative disc disease at C5-6.   He has seen multiple neurologists and headache and pain specialists, including Dr. Lynwood Schmitz, Dr. Dedra Dohmeieir, Dr. Velinda, Dr. Lindy, Dr. Malvina at [facility] and he was seen at the [facility] clinic.     He also has history of some obsessive compulsive symptoms since he was young.  For example, he repeatedly washes his hands and always uses a new cup during the day when he drinks something.  He has history of low blood pressure and has been evaluated by cardiology for palpitations.  Work up was unremarkable.  PAST MEDICAL HISTORY: Past Medical History:  Diagnosis Date   Allergy    Anxiety    Chronic kidney disease    Depression    Diverticulitis    Gastroparesis    Heart murmur    Migraine headache    Mitral valve prolapse    SVT (supraventricular tachycardia)     MEDICATIONS: Current Outpatient Medications on File Prior to Visit  Medication Sig Dispense Refill   amoxicillin  (AMOXIL ) 500 MG  tablet Take 1 tablet (500 mg total) by mouth 2 (two) times daily. 20 tablet 0   diltiazem  (CARDIZEM  CD) 120 MG 24 hr capsule Take 1 capsule (120 mg total) by mouth daily. 90 capsule 3   diltiazem  (CARDIZEM ) 30 MG tablet TAKE (1) TABLET AS NEEDED FOR PALPITATIONS 30 tablet 6   EMGALITY  120 MG/ML SOAJ INJECT 120MG  (1 PEN) UNDER THE SKIN EVERY 28 DAYS 3 mL 0   Evolocumab  (REPATHA  SURECLICK) 140 MG/ML SOAJ Inject 140 mg into the skin every 14 (fourteen) days. 6 mL 3   fluticasone  (FLONASE ) 50 MCG/ACT nasal spray Place 2 sprays into both nostrils daily. 16 g 6   hydrocortisone (ANUSOL-HC) 2.5 % rectal cream SMARTSIG:Rectally 3 Times Daily PRN     hydrOXYzine (ATARAX) 50 MG tablet Take 25-50 mg by mouth 2 (two) times daily as needed for itching.     metoCLOPramide  (REGLAN ) 5 MG tablet Take 5 mg by mouth as needed.     ondansetron  (ZOFRAN -ODT) 8 MG disintegrating tablet Take 0.5 tablets (4 mg total) by mouth every 8 (eight) hours as needed for nausea. 20 tablet 5   polyethylene glycol powder (GLYCOLAX /MIRALAX ) 17 GM/SCOOP powder Take 17 g by mouth as needed for moderate constipation.     predniSONE  (DELTASONE ) 20 MG tablet Take 2 tablets (40 mg total) by mouth daily with breakfast. 10 tablet 0   promethazine  (PHENERGAN ) 25 MG tablet Take 25 mg by mouth every 4 (four) hours as  needed for nausea.     Rimegepant Sulfate (NURTEC) 75 MG TBDP Take 1 tablet (75 mg total) by mouth as needed for up to 1 dose (Maximum 1 tablet in 24 hours.). 8 tablet 11   silodosin (RAPAFLO) 8 MG CAPS capsule Take 8 mg by mouth daily with breakfast.     traZODone  (DESYREL ) 50 MG tablet Take 1 tablet (50 mg total) by mouth at bedtime as needed for sleep. 30 tablet 3   Vitamin D , Ergocalciferol , (DRISDOL ) 1.25 MG (50000 UNIT) CAPS capsule Take 1 capsule (50,000 Units total) by mouth every 7 (seven) days. 12 capsule 0   No current facility-administered medications on file prior to visit.    ALLERGIES: Allergies  Allergen  Reactions   Erythromycin Anaphylaxis and Nausea And Vomiting   Nalbuphine Other (See Comments)    States loss of consciousness    Cefdinir  Diarrhea, Nausea And Vomiting and Rash   Cephalexin Diarrhea and Nausea And Vomiting   Cephalosporins Diarrhea and Nausea And Vomiting   Hydrocodone -Acetaminophen  Nausea And Vomiting   Ketorolac  Nausea And Vomiting    Other reaction(s): upset stomach   Prochlorperazine Edisylate Other (See Comments)    uncontrollable shake - tremor    Restasis  [Cyclosporine ] Other (See Comments)    migraines   Tyloxapol Swelling   Bacitracin-Polymyxin B     Other reaction(s): rash gets worse Other reaction(s): rash gets worse   Codeine  Other (See Comments)    Other reaction(s): stomach upset   Nalbuphine Hcl     Other reaction(s): stomach upset   Prochlorperazine     Other reaction(s): tremors/vomiting   Propranolol  Diarrhea   Rosuvastatin  Diarrhea   Topamax [Topiramate] Diarrhea and Nausea And Vomiting   Zetia  [Ezetimibe ] Diarrhea   Cefdinir  Diarrhea, Nausea And Vomiting and Rash    Other reaction(s): too stronge   Doxycycline Hyclate Nausea Only and Other (See Comments)    abd pain   Iodinated Contrast Media Other (See Comments)    Lightheaded, flushed feeling (explained to patient these are normal side effects of IV iodinated contrast, not allergies, but he wanted this noted in the epic; he tolerated epidural steroid injections w/ contrast w/o difficulty)   Ketoconazole  Itching   Ketorolac  Tromethamine  Rash    REACTION: Reaction not known   Latex Itching and Rash   Neosporin [Neomycin-Bacitracin Zn-Polymyx] Rash   Oxycodone -Acetaminophen  Other (See Comments)    Tylox caused constipation   Prednisone  Nausea And Vomiting    Oral route, only Other reaction(s): stomach spasms    FAMILY HISTORY: Family History  Problem Relation Age of Onset   Hypertension Mother    Lung cancer Father 26 - 63   Hypertension Brother    Kidney failure Maternal  Grandmother    Prostate cancer Neg Hx    Kidney cancer Neg Hx       Objective:  *** General: No acute distress.  Patient appears well-groomed.   Head:  Normocephalic/atraumatic Neck:  Supple.  No paraspinal tenderness.  Full range of motion. Heart:  Regular rate and rhythm. Neuro:  Alert and oriented.  Speech fluent and not dysarthric.  Language intact.  CN II-XII intact.  Bulk and tone normal.  Muscle strength 5/5 throughout.  Sensation to light touch intact.  Deep tendon reflexes 2+ throughout, toes downgoing.  Gait normal.  Romberg negative.     Juliene Dunnings, DO  CC: [employer], MD

## 2024-04-15 ENCOUNTER — Ambulatory Visit: Admitting: Neurology

## 2024-04-15 ENCOUNTER — Other Ambulatory Visit (HOSPITAL_COMMUNITY): Payer: Self-pay

## 2024-04-16 ENCOUNTER — Encounter: Payer: Self-pay | Admitting: Cardiovascular Disease

## 2024-04-16 ENCOUNTER — Other Ambulatory Visit: Payer: Self-pay | Admitting: *Deleted

## 2024-04-16 ENCOUNTER — Ambulatory Visit: Attending: Cardiovascular Disease | Admitting: Cardiovascular Disease

## 2024-04-16 VITALS — BP 122/68 | HR 75 | Ht 70.0 in | Wt 149.2 lb

## 2024-04-16 DIAGNOSIS — I2510 Atherosclerotic heart disease of native coronary artery without angina pectoris: Secondary | ICD-10-CM | POA: Diagnosis not present

## 2024-04-16 DIAGNOSIS — E782 Mixed hyperlipidemia: Secondary | ICD-10-CM

## 2024-04-16 DIAGNOSIS — I471 Supraventricular tachycardia, unspecified: Secondary | ICD-10-CM | POA: Diagnosis not present

## 2024-04-16 NOTE — Progress Notes (Signed)
 Chief Complaint  Patient presents with   Follow-up    SVT   History of Present Illness: 63 yo male with history of CAD (by calcium  score), palpitations, PVCs, PACs, SVT, migraine headaches, mitral valve prolapse and irritable bowel syndrome who is here today for cardiac follow up. He is known to have PVCs and SVT. Echo in 2014 with normal LV size and function. 48 hour monitor in 2014 with normal sinus rhythm and rare PACs. Cardiac event monitor November 2018 with sinus rhythm and one run of SVT (40 beats).  He was started on propranolol  for his SVT by primary care but it caused diarrhea. He has been on Cardizem . He was seen in our office October 2021 and reported more episodes of SVT. Cardiac monitor November 2021 with several short runs of SVT. He was seen in the EP clinic in November 2021 by Dr. Antonetta and he elected to continue Cardizem  rather than move toward an ablation. Echo March 2023 with LVEF=70-75%. Trivial MR. Cardiac monitor April 2023 with 4 runs of SVT with longest at 23 minutes. He was seen in the EP clinic by Dr. Antonetta in June 2023 and plans were to continue medical therapy with Cardizem  alone given diagnosis of prostate cancer. He was treated with radioactive seed implants. CT coronary calcium  score of 144 in May 2024. We started a statin but he stopped this due to painful urination. He is now on Repatha .   He is here today for follow up. The patient denies any chest pain, dyspnea, lower extremity edema, orthopnea, PND, dizziness, near syncope or syncope. He has palpitations several days per week that respond to short acting cardizem .   Primary Care Physician: Kip Righter, MD  Past Medical History:  Diagnosis Date   Allergy    Anxiety    Chronic kidney disease    Depression    Diverticulitis    Gastroparesis    Heart murmur    Migraine headache    Mitral valve prolapse    SVT (supraventricular tachycardia)    Past Surgical History:  Procedure Laterality Date    COLONOSCOPY W/ BIOPSIES  2023   RADIOACTIVE SEED IMPLANT N/A 01/19/2022   Procedure: RADIOACTIVE SEED IMPLANT/BRACHYTHERAPY IMPLANT;  Surgeon: Watt Rush, MD;  Location: [facility];  Service: Urology;  Laterality: N/A;   SPACE OAR INSTILLATION N/A 01/19/2022   Procedure: SPACE OAR INSTILLATION;  Surgeon: Watt Rush, MD;  Location: [facility];  Service: Urology;  Laterality: N/A;    Current Outpatient Medications  Medication Sig Dispense Refill   amoxicillin  (AMOXIL ) 500 MG tablet Take 1 tablet (500 mg total) by mouth 2 (two) times daily. 20 tablet 0   EMGALITY  120 MG/ML SOAJ INJECT 120MG  (1 PEN) UNDER THE SKIN EVERY 28 DAYS 3 mL 0   Evolocumab  (REPATHA  SURECLICK) 140 MG/ML SOAJ Inject 140 mg into the skin every 14 (fourteen) days. 6 mL 3   fluticasone  (FLONASE ) 50 MCG/ACT nasal spray Place 2 sprays into both nostrils daily. 16 g 6   hydrocortisone (ANUSOL-HC) 2.5 % rectal cream SMARTSIG:Rectally 3 Times Daily PRN     hydrOXYzine (ATARAX) 50 MG tablet Take 25-50 mg by mouth 2 (two) times daily as needed for itching.     metoCLOPramide  (REGLAN ) 5 MG tablet Take 5 mg by mouth as needed.     ondansetron  (ZOFRAN -ODT) 8 MG disintegrating tablet Take 0.5 tablets (4 mg total) by mouth every 8 (eight) hours as needed for nausea. 20 tablet 5  polyethylene glycol powder (GLYCOLAX /MIRALAX ) 17 GM/SCOOP powder Take 17 g by mouth as needed for moderate constipation.     predniSONE  (DELTASONE ) 20 MG tablet Take 2 tablets (40 mg total) by mouth daily with breakfast. 10 tablet 0   promethazine  (PHENERGAN ) 25 MG tablet Take 25 mg by mouth every 4 (four) hours as needed for nausea.     Rimegepant Sulfate (NURTEC) 75 MG TBDP Take 1 tablet (75 mg total) by mouth as needed for up to 1 dose (Maximum 1 tablet in 24 hours.). 8 tablet 11   silodosin (RAPAFLO) 8 MG CAPS capsule Take 8 mg by mouth daily with breakfast.     traZODone  (DESYREL ) 50 MG tablet Take 1 tablet (50 mg total) by  mouth at bedtime as needed for sleep. 30 tablet 3   diltiazem  (CARDIZEM  CD) 120 MG 24 hr capsule Take 1 capsule (120 mg total) by mouth daily. 90 capsule 3   diltiazem  (CARDIZEM ) 30 MG tablet TAKE (1) TABLET AS NEEDED FOR PALPITATIONS 30 tablet 6   No current facility-administered medications for this visit.    Allergies  Allergen Reactions   Erythromycin Anaphylaxis and Nausea And Vomiting   Nalbuphine Other (See Comments)    States loss of consciousness    Cefdinir  Diarrhea, Nausea And Vomiting and Rash   Cephalexin Diarrhea and Nausea And Vomiting   Cephalosporins Diarrhea and Nausea And Vomiting   Hydrocodone -Acetaminophen  Nausea And Vomiting   Ketorolac  Nausea And Vomiting    Other reaction(s): upset stomach   Prochlorperazine Edisylate Other (See Comments)    uncontrollable shake - tremor    Restasis  [Cyclosporine ] Other (See Comments)    migraines   Tyloxapol Swelling   Bacitracin-Polymyxin B     Other reaction(s): rash gets worse Other reaction(s): rash gets worse   Codeine  Other (See Comments)    Other reaction(s): stomach upset   Nalbuphine Hcl     Other reaction(s): stomach upset   Prochlorperazine     Other reaction(s): tremors/vomiting   Propranolol  Diarrhea   Rosuvastatin  Diarrhea   Topamax [Topiramate] Diarrhea and Nausea And Vomiting   Zetia  [Ezetimibe ] Diarrhea   Cefdinir  Diarrhea, Nausea And Vomiting and Rash    Other reaction(s): too stronge   Doxycycline Hyclate Nausea Only and Other (See Comments)    abd pain   Iodinated Contrast Media Other (See Comments)    Lightheaded, flushed feeling (explained to patient these are normal side effects of IV iodinated contrast, not allergies, but he wanted this noted in the epic; he tolerated epidural steroid injections w/ contrast w/o difficulty)   Ketoconazole  Itching   Ketorolac  Tromethamine  Rash    REACTION: Reaction not known   Latex Itching and Rash   Neosporin [Neomycin-Bacitracin Zn-Polymyx] Rash    Oxycodone -Acetaminophen  Other (See Comments)    Tylox caused constipation   Prednisone  Nausea And Vomiting    Oral route, only Other reaction(s): stomach spasms    Social History   Socioeconomic History   Marital status: Married    Spouse name: Not on file   Number of children: 0   Years of education: Not on file   Highest education level: Bachelor's degree (e.g., BA, AB, BS)  Occupational History   Not on file  Tobacco Use   Smoking status: Never   Smokeless tobacco: Never  Vaping Use   Vaping status: Never Used  Substance and Sexual Activity   Alcohol use: No    Alcohol/week: 0.0 standard drinks of alcohol   Drug use: No   Sexual  activity: Yes    Partners: Female  Other Topics Concern   Not on file  Social History Narrative   Pt is married lives with spouse in 1 story apartment he has No children   BS degree- he is right handed   Drinks soda  Ginger ale, no coffee no tea   Right handed   Social Drivers of Health   Tobacco Use: Low Risk (04/16/2024)   Patient History    Smoking Tobacco Use: Never    Smokeless Tobacco Use: Never    Passive Exposure: Not on file  Financial Resource Strain: Low Risk (10/25/2021)   Overall Financial Resource Strain (CARDIA)    Difficulty of Paying Living Expenses: Not hard at all  Food Insecurity: Low Risk (04/10/2024)   Received from [facility]   Epic    Within the past 12 months, you worried that your food would run out before you got money to buy more: Never true    Within the past 12 months, the food you bought just didn't last and you didn't have money to get more. : Never true  Transportation Needs: No Transportation Needs (04/10/2024)   Received from [facility]    In the past 12 months, has lack of reliable transportation kept you from medical appointments, meetings, work or from getting things needed for daily living? : No  Physical Activity: Not on file  Stress: Stress Concern Present (10/25/2021)    [employer] of Occupational Health - Occupational Stress Questionnaire    Feeling of Stress : Rather much  Social Connections: Not on file  Intimate Partner Violence: Not At Risk (02/05/2022)   Humiliation, Afraid, Rape, and Kick questionnaire    Fear of Current or Ex-Partner: No    Emotionally Abused: No    Physically Abused: No    Sexually Abused: No  Depression (PHQ2-9): Not on file  Alcohol Screen: Not on file  Housing: Low Risk (04/10/2024)   Received from [facility]   Epic    What is your living situation today?: I have a steady place to live    Think about the place you live. Do you have problems with any of the following? Choose all that apply:: None/None on this list  Utilities: Low Risk (04/10/2024)   Received from [facility]   Utilities    In the past 12 months has the electric, gas, oil, or water  company threatened to shut off services in your home? : No  Health Literacy: Not on file    Family History  Problem Relation Age of Onset   Hypertension Mother    Lung cancer Father 55 - 17   Hypertension Brother    Kidney failure Maternal Grandmother    Prostate cancer Neg Hx    Kidney cancer Neg Hx     Review of Systems:  As stated in the HPI and otherwise negative.   BP 122/68   Pulse 75   Ht 5' 10 (1.778 m)   Wt 149 lb 3.2 oz (67.7 kg)   SpO2 99%   BMI 21.41 kg/m   Physical Examination: General: Well developed, well nourished, NAD  HEENT: OP clear, mucus membranes moist  SKIN: warm, dry. No rashes. Neuro: No focal deficits  Musculoskeletal: Muscle strength 5/5 all ext  Psychiatric: Mood and affect normal  Neck: No JVD, no carotid bruits, no thyromegaly, no lymphadenopathy.  Lungs:Clear bilaterally, no wheezes, rhonci, crackles Cardiovascular: Regular rate and rhythm. No murmurs, gallops or rubs. Abdomen:Soft. Bowel  sounds present. Non-tender.  Extremities: No lower extremity edema. Pulses are 2 + in the bilateral DP/PT.     EKG:  EKG is  ordered today. The ekg ordered today demonstrates  EKG Interpretation Date/Time:  Thursday April 16 2024 09:57:12 EST Ventricular Rate:  75 PR Interval:  172 QRS Duration:  72 QT Interval:  366 QTC Calculation: 408 R Axis:   53  Text Interpretation: Normal sinus rhythm Normal ECG When compared with ECG of 19-Apr-2023 10:30, No significant change was found Confirmed by Verlin Bruckner [phone]) on 04/16/2024 10:11:46 AM    Recent Labs: No results found for requested labs within last 365 days.   Lipid Panel    Component Value Date/Time   CHOL 210 (H) 04/15/2023 0812   TRIG 95 04/15/2023 0812   HDL 60 04/15/2023 0812   CHOLHDL 3.5 04/15/2023 0812   CHOLHDL 3.7 05/14/2015 0958   VLDL 25 05/14/2015 0958   LDLCALC 133 (H) 04/15/2023 0812     Wt Readings from Last 3 Encounters:  04/16/24 149 lb 3.2 oz (67.7 kg)  02/11/24 152 lb (68.9 kg)  01/02/24 150 lb 12.8 oz (68.4 kg)    Assessment and Plan:   1. SVT:   He is known to have SVT. Echo March 2023 with LVEF=70-75%. Trivial MR. Cardiac monitor April 2023 with 4 runs of SVT with longest at 23 minutes. He was seen by Dr. Antonetta in June 2023 and plans were to continue medical therapy with Cardizem  alone.  He has been doing well short runs of SVT several times per week.   -Continue Cardizem  CD 120 mg daily and short acting Cardizem  as needed. If he has long runs of symptomatic SVT, will need consideration for SVT ablation.   2. CAD without angina: Abnormal coronary calcium  score in 2024. He did not tolerate statins.  -Continue ASA and Repatha .     3. Hyperlipidemia: LDL over 150 in May 2025. This was before starting Repatha  -Continue Repatha  -Repeat lipids in 6-8 weeks   Labs/ tests ordered today include:   Orders Placed This Encounter  Procedures   Lipid Profile   EKG 12-Lead   Disposition:   F/U with me in 12 months.   Signed, Bruckner Verlin, MD 04/16/2024 10:42 AM    [facility]  [facility] [address] [place], [place], [place]  [address] Phone: [phone]; Fax: [phone]

## 2024-04-16 NOTE — Patient Instructions (Signed)
 Medication Instructions:  Your physician recommends that you continue on your current medications as directed. Please refer to the Current Medication list given to you today.  *If you need a refill on your cardiac medications before your next appointment, please call your pharmacy*  Lab Work: Have fasting lab work (lipids) drawn in 6 weeks  Can be done in the [name] on the first floor of our building or any [name] location If you have labs (blood work) drawn today and your tests are completely normal, you will receive your results only by: MyChart Message (if you have MyChart) OR A paper copy in the mail If you have any lab test that is abnormal or we need to change your treatment, we will call you to review the results.  Testing/Procedures: none  Follow-Up: At [facility], you and your health needs are our priority.  As part of our continuing mission to provide you with exceptional heart care, our providers are all part of one team.  This team includes your primary Cardiologist (physician) and Advanced Practice Providers or APPs (Physician Assistants and Nurse Practitioners) who all work together to provide you with the care you need, when you need it.  Your next appointment:   12 month(s)  Provider:   Lonni Cash, MD    We recommend signing up for the patient portal called MyChart.  Sign up information is provided on this After Visit Summary.  MyChart is used to connect with patients for Virtual Visits (Telemedicine).  Patients are able to view lab/test results, encounter notes, upcoming appointments, etc.  Non-urgent messages can be sent to your provider as well.   To learn more about what you can do with MyChart, go to [email or web address].   Other Instructions

## 2024-04-20 ENCOUNTER — Telehealth: Payer: Self-pay | Admitting: Neurology

## 2024-04-20 MED ORDER — NURTEC 75 MG PO TBDP: 75.0000 mg | ORAL_TABLET | ORAL | 3 refills | Status: DC | PRN

## 2024-04-20 NOTE — Telephone Encounter (Signed)
 Pt called in this afternoon and he would like to get a refill on Prescription called: Nurtec or get some samples. Thanks

## 2024-04-20 NOTE — Telephone Encounter (Signed)
 Nurtec refilled.   Patient send in income information for [employer] via mychart.

## 2024-04-20 NOTE — Progress Notes (Deleted)
 "  NEUROLOGY FOLLOW UP OFFICE NOTE  William [name] [id number]  Assessment/Plan:   1.  Migraine without aura, without status migrainosus, not intractable - increased frequency. 2.  Left lumbosacral radiculopathy/radiculitis 3.  Essential tremor - very mild   1.  Migraine prevention:  Emgality .  If he continues to have trouble getting the medication, consider changing to Botox 2.  Migraine rescue:  Nurtec and Zofran   3.  Limit use of pain relievers to no more than 2 days out of week to prevent risk of rebound or medication-overuse headache. 4.  Keep headache diary 5.  Monitor tremor  6.  Follow up one year or sooner if needed.   Subjective:  William [name] is a 63 year old right-handed male with hyperlipidemia, MVP, IBS, gastroparesis, generalized anxiety disorder and palpitations who follows up for migraines and tremor.   UPDATE: Migraines: On Emgality  with help from the [facility].  He is doing well.  *** Intensity:  Moderate to severe Duration:  Less than 15 minutes with Nurtec Frequency: 1 to 2 a month  Frequency of abortive medication: 0 days in past 30 days   Tremor: Stable.  Manageable.  Left lower extremity pain: Followed by pain management.  Had a lumbar epidural last December ***   Current medications: Current NSAIDS:  none Current analgesics:  None Current triptans:  Contraindicated (MVP) Current ergotamine:  none Current anti-emetic:  Zofran  4mg  Current muscle relaxants:  baclofen  10mg  TID Current anti-anxiolytic:  none Current sleep aide:  none Current Antihypertensive medications:  Cardizem  Current Antidepressant medications:  amitriptyline 25mg  at bedtime Current Anticonvulsant medications:  none Current anti-CGRP: Emgality ; Nurtec Current Vitamins/Herbal/Supplements:  D Current Antihistamines/Decongestants:  Claritin , Flonase  Other therapy:  none   Caffeine :  No coffee, tea or caffeinated soda Diet:  Needs to improve.  Sometimes ginger  ale Exercise:  No Depression:  no; Anxiety:  Yes.  stress related to prostate cancer diagnosis Other pain:  no Sleep hygiene:  Poor.  Stress related to caregiver for his mother   HISTORY: Migraines:  Onset: 1986.   Location: 90% of time: Left retro-orbital region, 10% bi-frontal Quality:  stabbing Initial intensity: Fluctuates from 4/10-10/10 Aura:  no Prodrome:  no Associated symptoms: Lightheadedness, fatigue, nausea, tearing of left eye. Initial duration: 4 hours Initial frequency: 4 to 5 times a week Triggers/aggravating factors: Afrin, odors (perfumes, cigarette, chinese food), MSG, salt, stress, extreme temperatures Relieving factors: Water  Activity:  About 4 to 5 days a week, cannot function  Tremor: He also reports longstanding history of tremor.  It involves the left hand.  It occurs with use, not at rest.  Over the past 10 years, it has gotten a little worse.  He does report increased emotional stress and poor sleep.  No problems with walking.  No family history of tremor.   Left lower extremity pain: History of chronic pain followed by pain management, including history of left leg pain off and on.  Injections in the back previously have been helpful.  Recurrence 4-5 weeks ago.  Burning/pins and needles on bottom of left feet and radiates up the back and side of the leg to the left lower back.  However, now he has pain on urination off and on since the seed implant last year.  Always tests negative for UTI.  Thought to be neuralgia.  MRI of lumbar spine on 03/23/2023 revealed disc degeneration with shallow disc protrusion and moderate bilateral foraminal encroachment at L4-5 and L5-S1 possibly effecting either L5  nerve root.  Urology has prescribed amitriptyline for the urinary pain.   Past medications: Past NSAIDS: Indomethacin Past analgesics: Nucynta, lidocaine  patches, tramadol , nalbuphine, Tylox, bupivacaine injections, hydrocodone , morphine, Tylenol  #2, Tylenol  Past  abortive triptans:  Sumatriptan po/[place], Maxalt, Relpax, Amerge, Zomig Past abortive ergotamine:  Migranal, DHE protocol Past muscle relaxants:  Robaxin , Flexeril  Past anti-emetic:  Reglan , Phenergan  Past anxiolytics:  Temazeam, clonazepam  Past antihypertensive medications:  Inderal , metoprolol, verapamil Past antidepressant medications:  Nortriptyline, amitriptyline, venlafaxine, Wellbutrin, Remeron  Past anticonvulsant medications:  Depakote, topiramate (weight loss, diarrhea), Lanice Saupe, gabapentin  Past anti-CGRP:  none Past vitamins/Herbal/Supplements:  petasites Past antihistamines/decongestants:  none Other past therapies:  Biofeedback, acupuncture, counseling     He has had multiple imaging of the head, all unremarkable, including:    CT HEAD from 06/01/09 and 07/30/09. MRI BRAIN W/WO from 12/08/02, 01/19/05, 04/18/12.  MRI of brain ordered by me from 11/19/2015 was personally reviewed and was normal.   CERVICAL XR from 01/15/08 showed mild degenerative disc disease at C5-6.   He has seen multiple neurologists and headache and pain specialists, including Dr. Lynwood Schmitz, Dr. Dedra Dohmeieir, Dr. Velinda, Dr. Lindy, Dr. Malvina at [facility] and he was seen at the [facility].     He also has history of some obsessive compulsive symptoms since he was young.  For example, he repeatedly washes his hands and always uses a new cup during the day when he drinks something.  He has history of low blood pressure and has been evaluated by cardiology for palpitations.  Work up was unremarkable.  PAST MEDICAL HISTORY: Past Medical History:  Diagnosis Date   Allergy    Anxiety    Chronic kidney disease    Depression    Diverticulitis    Gastroparesis    Heart murmur    Migraine headache    Mitral valve prolapse    SVT (supraventricular tachycardia)     MEDICATIONS: Current Outpatient Medications on File Prior to Visit  Medication Sig Dispense Refill   amoxicillin  (AMOXIL ) 500 MG  tablet Take 1 tablet (500 mg total) by mouth 2 (two) times daily. 20 tablet 0   diltiazem  (CARDIZEM  CD) 120 MG 24 hr capsule Take 1 capsule (120 mg total) by mouth daily. 90 capsule 3   diltiazem  (CARDIZEM ) 30 MG tablet TAKE (1) TABLET AS NEEDED FOR PALPITATIONS 30 tablet 6   EMGALITY  120 MG/ML SOAJ INJECT 120MG  (1 PEN) UNDER THE SKIN EVERY 28 DAYS 3 mL 0   Evolocumab  (REPATHA  SURECLICK) 140 MG/ML SOAJ Inject 140 mg into the skin every 14 (fourteen) days. 6 mL 3   fluticasone  (FLONASE ) 50 MCG/ACT nasal spray Place 2 sprays into both nostrils daily. 16 g 6   hydrocortisone (ANUSOL-HC) 2.5 % rectal cream SMARTSIG:Rectally 3 Times Daily PRN     hydrOXYzine (ATARAX) 50 MG tablet Take 25-50 mg by mouth 2 (two) times daily as needed for itching.     metoCLOPramide  (REGLAN ) 5 MG tablet Take 5 mg by mouth as needed.     ondansetron  (ZOFRAN -ODT) 8 MG disintegrating tablet Take 0.5 tablets (4 mg total) by mouth every 8 (eight) hours as needed for nausea. 20 tablet 5   polyethylene glycol powder (GLYCOLAX /MIRALAX ) 17 GM/SCOOP powder Take 17 g by mouth as needed for moderate constipation.     predniSONE  (DELTASONE ) 20 MG tablet Take 2 tablets (40 mg total) by mouth daily with breakfast. 10 tablet 0   promethazine  (PHENERGAN ) 25 MG tablet Take 25 mg by mouth every 4 (four) hours  as needed for nausea.     Rimegepant Sulfate (NURTEC) 75 MG TBDP Take 1 tablet (75 mg total) by mouth as needed for up to 1 dose (Maximum 1 tablet in 24 hours.). 8 tablet 11   silodosin (RAPAFLO) 8 MG CAPS capsule Take 8 mg by mouth daily with breakfast.     traZODone  (DESYREL ) 50 MG tablet Take 1 tablet (50 mg total) by mouth at bedtime as needed for sleep. 30 tablet 3   No current facility-administered medications on file prior to visit.    ALLERGIES: Allergies  Allergen Reactions   Erythromycin Anaphylaxis and Nausea And Vomiting   Nalbuphine Other (See Comments)    States loss of consciousness    Cefdinir  Diarrhea, Nausea  And Vomiting and Rash   Cephalexin Diarrhea and Nausea And Vomiting   Cephalosporins Diarrhea and Nausea And Vomiting   Hydrocodone -Acetaminophen  Nausea And Vomiting   Ketorolac  Nausea And Vomiting    Other reaction(s): upset stomach   Prochlorperazine Edisylate Other (See Comments)    uncontrollable shake - tremor    Restasis  [Cyclosporine ] Other (See Comments)    migraines   Tyloxapol Swelling   Bacitracin-Polymyxin B     Other reaction(s): rash gets worse Other reaction(s): rash gets worse   Codeine  Other (See Comments)    Other reaction(s): stomach upset   Nalbuphine Hcl     Other reaction(s): stomach upset   Prochlorperazine     Other reaction(s): tremors/vomiting   Propranolol  Diarrhea   Rosuvastatin  Diarrhea   Topamax [Topiramate] Diarrhea and Nausea And Vomiting   Zetia  [Ezetimibe ] Diarrhea   Cefdinir  Diarrhea, Nausea And Vomiting and Rash    Other reaction(s): too stronge   Doxycycline Hyclate Nausea Only and Other (See Comments)    abd pain   Iodinated Contrast Media Other (See Comments)    Lightheaded, flushed feeling (explained to patient these are normal side effects of IV iodinated contrast, not allergies, but he wanted this noted in the epic; he tolerated epidural steroid injections w/ contrast w/o difficulty)   Ketoconazole  Itching   Ketorolac  Tromethamine  Rash    REACTION: Reaction not known   Latex Itching and Rash   Neosporin [Neomycin-Bacitracin Zn-Polymyx] Rash   Oxycodone -Acetaminophen  Other (See Comments)    Tylox caused constipation   Prednisone  Nausea And Vomiting    Oral route, only Other reaction(s): stomach spasms    FAMILY HISTORY: Family History  Problem Relation Age of Onset   Hypertension Mother    Lung cancer Father 70 - 47   Hypertension Brother    Kidney failure Maternal Grandmother    Prostate cancer Neg Hx    Kidney cancer Neg Hx       Objective:  *** General: No acute distress.  Patient appears well-groomed.   Head:   Normocephalic/atraumatic Neck:  Supple.  No paraspinal tenderness.  Full range of motion. Heart:  Regular rate and rhythm. Neuro:  Alert and oriented.  Speech fluent and not dysarthric.  Language intact.  CN II-XII intact.  Bulk and tone normal.  Muscle strength 5/5 throughout.  Sensation to light touch intact.  Deep tendon reflexes 2+ throughout, toes downgoing.  Gait normal.  Romberg negative.     Juliene Dunnings, DO  CC: [employer], MD      "

## 2024-04-21 ENCOUNTER — Other Ambulatory Visit (HOSPITAL_COMMUNITY): Payer: Self-pay

## 2024-04-21 ENCOUNTER — Ambulatory Visit: Admitting: Neurology

## 2024-04-27 NOTE — Telephone Encounter (Signed)
 Forms for William [name] faxed over.

## 2024-04-28 ENCOUNTER — Ambulatory Visit: Admitting: Neurology

## 2024-05-01 NOTE — Telephone Encounter (Signed)
 [facility] Letter:   The patient meets program eligibly requirements and is enrolled in [facility] until the end of 2026 calendar year.

## 2024-05-08 ENCOUNTER — Telehealth: Payer: Self-pay | Admitting: Neurology

## 2024-05-08 NOTE — Telephone Encounter (Signed)
 PH: [phone]  William [name] came in to drop off [employer], William [name] is considering a procedure. William [name] was informed that William [name] would need to talk  with the neurologist 1st.

## 2024-05-12 NOTE — Progress Notes (Unsigned)
 " William [name] JENI [name] [address] [place] [address] Phone: [phone] Subjective:    I'm seeing this patient by the request  of:  Kip Righter, MD  CC:   YEP:Dlagzrupcz  02/11/2024 B12 injection given today and repeat monthly  Downtrending labs patient is at a good area with no significant pain.  No change in medication, follow-up with me again 3 to 4 months     Have done extremely well after physical therapy.     Updated 05/14/2024 William [name] Code is a 64 y.o. male coming in with complaint of ***       Past Medical History:  Diagnosis Date   Allergy    Anxiety    Chronic kidney disease    Depression    Diverticulitis    Gastroparesis    Heart murmur    Migraine headache    Mitral valve prolapse    SVT (supraventricular tachycardia)    Past Surgical History:  Procedure Laterality Date   COLONOSCOPY W/ BIOPSIES  2023   RADIOACTIVE SEED IMPLANT N/A 01/19/2022   Procedure: RADIOACTIVE SEED IMPLANT/BRACHYTHERAPY IMPLANT;  Surgeon: Watt Rush, MD;  Location: [facility];  Service: Urology;  Laterality: N/A;   SPACE OAR INSTILLATION N/A 01/19/2022   Procedure: SPACE OAR INSTILLATION;  Surgeon: Watt Rush, MD;  Location: [facility];  Service: Urology;  Laterality: N/A;   Social History   Socioeconomic History   Marital status: Married    Spouse name: Not on file   Number of children: 0   Years of education: Not on file   Highest education level: Bachelor's degree (e.g., BA, AB, BS)  Occupational History   Not on file  Tobacco Use   Smoking status: Never   Smokeless tobacco: Never  Vaping Use   Vaping status: Never Used  Substance and Sexual Activity   Alcohol use: No    Alcohol/week: 0.0 standard drinks of alcohol   Drug use: No   Sexual activity: Yes    Partners: Female  Other Topics Concern   Not on file  Social History Narrative   Pt is married lives with spouse in 1 story apartment  he has No children   BS degree- he is right handed   Drinks soda  Ginger ale, no coffee no tea   Right handed   Social Drivers of Health   Tobacco Use: Low Risk (04/16/2024)   Patient History    Smoking Tobacco Use: Never    Smokeless Tobacco Use: Never    Passive Exposure: Not on file  Financial Resource Strain: Low Risk (10/25/2021)   Overall Financial Resource Strain (CARDIA)    Difficulty of Paying Living Expenses: Not hard at all  Food Insecurity: Low Risk (05/11/2024)   Received from [facility]   Epic    Within the past 12 months, you worried that your food would run out before you got money to buy more: Never true    Within the past 12 months, the food you bought just didn't last and you didn't have money to get more. : Never true  Transportation Needs: No Transportation Needs (05/11/2024)   Received from [facility]    In the past 12 months, has lack of reliable transportation kept you from medical appointments, meetings, work or from getting things needed for daily living? : No  Physical Activity: Not on file  Stress: Stress Concern Present (10/25/2021)   [employer] Occup[employer] -  Occupational Stress Questionnaire    Feeling of Stress : Rather much  Social Connections: Not on file  Depression (EYV7-0): Not on file  Alcohol Screen: Not on file  Housing: Low Risk (05/11/2024)   Received from [facility]   Epic    What is your living situation today?: I have a steady place to live    Think about the place you live. Do you have problems with any of the following? Choose all that apply:: None/None on this list  Utilities: Low Risk (05/11/2024)   Received from [facility]   Utilities    In the past 12 months has the electric, gas, oil, or water  company threatened to shut off services in your home? : No  Health Literacy: Not on file   Allergies[1] Family History  Problem Relation Age of Onset   Hypertension Mother    Lung cancer  Father 81 - 46   Hypertension Brother    Kidney failure Maternal Grandmother    Prostate cancer Neg Hx    Kidney cancer Neg Hx     Current Outpatient Medications (Endocrine & Metabolic):    predniSONE  (DELTASONE ) 20 MG tablet, Take 2 tablets (40 mg total) by mouth daily with breakfast.  Current Outpatient Medications (Cardiovascular):    diltiazem  (CARDIZEM  CD) 120 MG 24 hr capsule, Take 1 capsule (120 mg total) by mouth daily.   diltiazem  (CARDIZEM ) 30 MG tablet, TAKE (1) TABLET AS NEEDED FOR PALPITATIONS   Evolocumab  (REPATHA  SURECLICK) 140 MG/ML SOAJ, Inject 140 mg into the skin every 14 (fourteen) days.  Current Outpatient Medications (Respiratory):    fluticasone  (FLONASE ) 50 MCG/ACT nasal spray, Place 2 sprays into both nostrils daily.   promethazine  (PHENERGAN ) 25 MG tablet, Take 25 mg by mouth every 4 (four) hours as needed for nausea.  Current Outpatient Medications (Analgesics):    EMGALITY  120 MG/ML SOAJ, INJECT 120MG  (1 PEN) UNDER THE SKIN EVERY 28 DAYS   Rimegepant Sulfate (NURTEC) 75 MG TBDP, Take 1 tablet (75 mg total) by mouth as needed for up to 1 dose (Maximum 1 tablet in 24 hours.).  Current Outpatient Medications (Other):    amoxicillin  (AMOXIL ) 500 MG tablet, Take 1 tablet (500 mg total) by mouth 2 (two) times daily.   hydrocortisone (ANUSOL-HC) 2.5 % rectal cream, SMARTSIG:Rectally 3 Times Daily PRN   hydrOXYzine (ATARAX) 50 MG tablet, Take 25-50 mg by mouth 2 (two) times daily as needed for itching.   metoCLOPramide  (REGLAN ) 5 MG tablet, Take 5 mg by mouth as needed.   ondansetron  (ZOFRAN -ODT) 8 MG disintegrating tablet, Take 0.5 tablets (4 mg total) by mouth every 8 (eight) hours as needed for nausea.   polyethylene glycol powder (GLYCOLAX /MIRALAX ) 17 GM/SCOOP powder, Take 17 g by mouth as needed for moderate constipation.   silodosin (RAPAFLO) 8 MG CAPS capsule, Take 8 mg by mouth daily with breakfast.   traZODone  (DESYREL ) 50 MG tablet, Take 1 tablet (50 mg  total) by mouth at bedtime as needed for sleep.   Reviewed prior external information including notes and imaging from  primary care provider As well as notes that were available from care everywhere and other healthcare systems.  Past medical history, social, surgical and family history all reviewed in electronic medical record.  No pertanent information unless stated regarding to the chief complaint.   Review of Systems:  No headache, visual changes, nausea, vomiting, diarrhea, constipation, dizziness, abdominal pain, skin rash, fevers, chills, night sweats, weight loss, swollen lymph nodes, body aches, joint swelling,  chest pain, shortness of breath, mood changes. POSITIVE muscle aches  Objective  There were no vitals taken for this visit.   General: No apparent distress alert and oriented x3 mood and affect normal, dressed appropriately.  HEENT: Pupils equal, extraocular movements intact  Respiratory: Patient's speak in full sentences and does not appear short of breath  Cardiovascular: No lower extremity edema, non tender, no erythema      Impression and Recommendations:           [1]  Allergies Allergen Reactions   Erythromycin Anaphylaxis and Nausea And Vomiting   Nalbuphine Other (See Comments)    States loss of consciousness    Cefdinir  Diarrhea, Nausea And Vomiting and Rash   Cephalexin Diarrhea and Nausea And Vomiting   Cephalosporins Diarrhea and Nausea And Vomiting   Hydrocodone -Acetaminophen  Nausea And Vomiting   Ketorolac  Nausea And Vomiting    Other reaction(s): upset stomach   Prochlorperazine Edisylate Other (See Comments)    uncontrollable shake - tremor    Restasis  [Cyclosporine ] Other (See Comments)    migraines   Tyloxapol Swelling   Bacitracin-Polymyxin B     Other reaction(s): rash gets worse Other reaction(s): rash gets worse   Codeine  Other (See Comments)    Other reaction(s): stomach upset   Nalbuphine Hcl     Other reaction(s):  stomach upset   Prochlorperazine     Other reaction(s): tremors/vomiting   Propranolol  Diarrhea   Rosuvastatin  Diarrhea   Topamax [Topiramate] Diarrhea and Nausea And Vomiting   Zetia  [Ezetimibe ] Diarrhea   Cefdinir  Diarrhea, Nausea And Vomiting and Rash    Other reaction(s): too stronge   Doxycycline Hyclate Nausea Only and Other (See Comments)    abd pain   Iodinated Contrast Media Other (See Comments)    Lightheaded, flushed feeling (explained to patient these are normal side effects of IV iodinated contrast, not allergies, but he wanted this noted in the epic; he tolerated epidural steroid injections w/ contrast w/o difficulty)   Ketoconazole  Itching   Ketorolac  Tromethamine  Rash    REACTION: Reaction not known   Latex Itching and Rash   Neosporin [Neomycin-Bacitracin Zn-Polymyx] Rash   Oxycodone -Acetaminophen  Other (See Comments)    Tylox caused constipation   Prednisone  Nausea And Vomiting    Oral route, only Other reaction(s): stomach spasms   "

## 2024-05-13 ENCOUNTER — Ambulatory Visit: Admitting: Behavioral Health

## 2024-05-13 NOTE — Telephone Encounter (Signed)
 Reviewed forms from box, Per note patient would like to consider the procedure Infinity Pro LLLT for Dry Eye Relief.  He wants to know would the procedure trigger Migraines.   Paperwork on your desk.

## 2024-05-14 ENCOUNTER — Encounter: Payer: Self-pay | Admitting: Family Medicine

## 2024-05-14 ENCOUNTER — Ambulatory Visit: Admitting: Family Medicine

## 2024-05-14 NOTE — Telephone Encounter (Signed)
 Patient advised of Dr.jaffe, I think headache is a possible side effect but I don't believe there is any literature stating that it specifically triggers migraines but it is possible. Actually, I think there are some studies looking into whether it can be a treatment for migraines but there really is no data on efficacy and safety/side effects. I can't really give advice one way or the other.

## 2024-05-18 NOTE — Progress Notes (Unsigned)
 "  Virtual Visit via Video Note:   Consent was obtained for video visit:  Yes.   Answered questions that patient had about telehealth interaction:  Yes.   I discussed the limitations, risks, security and privacy concerns of performing an evaluation and management service by telemedicine. I also discussed with the patient that there may be a patient responsible charge related to this service. The patient expressed understanding and agreed to proceed.  Pt location: Home Physician Location: office Name of referring provider:  Kip Righter, MD I connected with William [name] at patients initiation/request on 05/19/2024 at  9:30 AM EST by video enabled telemedicine application and verified that I am speaking with the correct person using two identifiers. Pt MRN:  [id number] Pt DOB:  Sep 03, 1960 Video Participants:  William [name]  Assessment/Plan:    1.  Migraine without aura, without status migrainosus, not intractable - increased frequency. 2.  Left lumbosacral radiculopathy/radiculitis 3.  Essential tremor - very mild   1.  Migraine prevention:  Emgality .  If he continues to have trouble getting the medication, consider changing to Botox 2.  Migraine rescue:  Nurtec and Zofran   3.  Limit use of pain relievers to no more than 2 days out of week to prevent risk of rebound or medication-overuse headache. 4.  Keep headache diary 5.  Monitor tremor  6.  Regarding the LLLT:  While headache may be a possible side effect, based on my literature search, it is not prominent and doesn't last long.  Most people do not experience headache and there really isn't any data reporting aggravating migraines.  Therefore, I have no objection to proceed with the therapy if it will provide improvement in his quality of life. 6.  Follow up one year or sooner if needed.   Subjective:  William [name] is a 64 year old right-handed male with hyperlipidemia, MVP, IBS, gastroparesis, generalized anxiety  disorder and palpitations who follows up for migraines and tremor.   UPDATE: Migraines: On Emgality  with help from the [facility].  He is doing well.   Intensity:  Moderate to severe Duration:  Less than 15 minutes with Nurtec Frequency: 1 every other month or so   He is scheduled to undergo low-level laser therapy for treatment of his dry eye.  However, he is concerned that it may aggravate his migraines.  Tremor: Stable.  Manageable.   Current medications: Current NSAIDS:  none Current analgesics:  None Current triptans:  Contraindicated (MVP) Current ergotamine:  none Current anti-emetic:  Zofran  4mg  Current muscle relaxants:  baclofen  10mg  TID Current anti-anxiolytic:  none Current sleep aide:  none Current Antihypertensive medications:  Cardizem  Current Antidepressant medications:  amitriptyline 25mg  at bedtime Current Anticonvulsant medications:  none Current anti-CGRP: Emgality ; Nurtec Current Vitamins/Herbal/Supplements:  D Current Antihistamines/Decongestants:  Claritin , Flonase  Other therapy:  none   Caffeine :  No coffee, tea or caffeinated soda Diet:  Needs to improve.  Sometimes ginger ale Exercise:  No Depression:  no; Anxiety:  Yes.  stress related to prostate cancer diagnosis Other pain:  no Sleep hygiene:  Poor.  Stress related to caregiver for his mother   HISTORY: Migraines:  Onset: 1986.   Location: 90% of time: Left retro-orbital region, 10% bi-frontal Quality:  stabbing Initial intensity: Fluctuates from 4/10-10/10 Aura:  no Prodrome:  no Associated symptoms: Lightheadedness, fatigue, nausea, tearing of left eye. Initial duration: 4 hours Initial frequency: 4 to 5 times a week Triggers/aggravating factors: Afrin, odors (perfumes, cigarette, chinese food), MSG,  salt, stress, extreme temperatures Relieving factors: Water  Activity:  About 4 to 5 days a week, cannot function  Tremor: He also reports longstanding history of tremor.  It  involves the left hand.  It occurs with use, not at rest.  Over the past 10 years, it has gotten a little worse.  He does report increased emotional stress and poor sleep.  No problems with walking.  No family history of tremor.   Left lower extremity pain: History of chronic pain followed by pain management, including history of left leg pain off and on.  Injections in the back previously have been helpful.  Recurrence 4-5 weeks ago.  Burning/pins and needles on bottom of left feet and radiates up the back and side of the leg to the left lower back.  However, now he has pain on urination off and on since the seed implant last year.  Always tests negative for UTI.  Thought to be neuralgia.  MRI of lumbar spine on 03/23/2023 revealed disc degeneration with shallow disc protrusion and moderate bilateral foraminal encroachment at L4-5 and L5-S1 possibly effecting either L5 nerve root.  Urology has prescribed amitriptyline for the urinary pain.   Past medications: Past NSAIDS: Indomethacin Past analgesics: Nucynta, lidocaine  patches, tramadol , nalbuphine, Tylox, bupivacaine injections, hydrocodone , morphine, Tylenol  #2, Tylenol  Past abortive triptans:  Sumatriptan po/[place], Maxalt, Relpax, Amerge, Zomig Past abortive ergotamine:  Migranal, DHE protocol Past muscle relaxants:  Robaxin , Flexeril  Past anti-emetic:  Reglan , Phenergan  Past anxiolytics:  Temazeam, clonazepam  Past antihypertensive medications:  Inderal , metoprolol, verapamil Past antidepressant medications:  Nortriptyline, amitriptyline, venlafaxine, Wellbutrin, Remeron  Past anticonvulsant medications:  Depakote, topiramate (weight loss, diarrhea), Lanice Saupe, gabapentin  Past anti-CGRP:  none Past vitamins/Herbal/Supplements:  petasites Past antihistamines/decongestants:  none Other past therapies:  Biofeedback, acupuncture, counseling     He has had multiple imaging of the head, all unremarkable, including:    CT HEAD from 06/01/09 and  07/30/09. MRI BRAIN W/WO from 12/08/02, 01/19/05, 04/18/12.  MRI of brain ordered by me from 11/19/2015 was personally reviewed and was normal.   CERVICAL XR from 01/15/08 showed mild degenerative disc disease at C5-6.   He has seen multiple neurologists and headache and pain specialists, including Dr. Lynwood Schmitz, Dr. Dedra Dohmeieir, Dr. Velinda, Dr. Lindy, Dr. Malvina at [facility] and he was seen at the [facility].     He also has history of some obsessive compulsive symptoms since he was young.  For example, he repeatedly washes his hands and always uses a new cup during the day when he drinks something.  He has history of low blood pressure and has been evaluated by cardiology for palpitations.  Work up was unremarkable.  Past Medical History: Past Medical History:  Diagnosis Date   Allergy    Anxiety    Chronic kidney disease    Depression    Diverticulitis    Gastroparesis    Heart murmur    Migraine headache    Mitral valve prolapse    SVT (supraventricular tachycardia)     Medications: Outpatient Encounter Medications as of 05/19/2024  Medication Sig   amoxicillin  (AMOXIL ) 500 MG tablet Take 1 tablet (500 mg total) by mouth 2 (two) times daily.   diltiazem  (CARDIZEM  CD) 120 MG 24 hr capsule Take 1 capsule (120 mg total) by mouth daily.   diltiazem  (CARDIZEM ) 30 MG tablet TAKE (1) TABLET AS NEEDED FOR PALPITATIONS   EMGALITY  120 MG/ML SOAJ INJECT 120MG  (1 PEN) UNDER THE SKIN EVERY 28 DAYS   Evolocumab  (  REPATHA  SURECLICK) 140 MG/ML SOAJ Inject 140 mg into the skin every 14 (fourteen) days.   fluticasone  (FLONASE ) 50 MCG/ACT nasal spray Place 2 sprays into both nostrils daily.   hydrocortisone (ANUSOL-HC) 2.5 % rectal cream SMARTSIG:Rectally 3 Times Daily PRN   hydrOXYzine (ATARAX) 50 MG tablet Take 25-50 mg by mouth 2 (two) times daily as needed for itching.   metoCLOPramide  (REGLAN ) 5 MG tablet Take 5 mg by mouth as needed.   ondansetron  (ZOFRAN -ODT) 8 MG disintegrating  tablet Take 0.5 tablets (4 mg total) by mouth every 8 (eight) hours as needed for nausea.   polyethylene glycol powder (GLYCOLAX /MIRALAX ) 17 GM/SCOOP powder Take 17 g by mouth as needed for moderate constipation.   predniSONE  (DELTASONE ) 20 MG tablet Take 2 tablets (40 mg total) by mouth daily with breakfast.   promethazine  (PHENERGAN ) 25 MG tablet Take 25 mg by mouth every 4 (four) hours as needed for nausea.   Rimegepant Sulfate (NURTEC) 75 MG TBDP Take 1 tablet (75 mg total) by mouth as needed for up to 1 dose (Maximum 1 tablet in 24 hours.).   silodosin (RAPAFLO) 8 MG CAPS capsule Take 8 mg by mouth daily with breakfast.   traZODone  (DESYREL ) 50 MG tablet Take 1 tablet (50 mg total) by mouth at bedtime as needed for sleep.   No facility-administered encounter medications on file as of 05/19/2024.    Allergies: Allergies[1]  Family History: Family History  Problem Relation Age of Onset   Hypertension Mother    Lung cancer Father 68 - 35   Hypertension Brother    Kidney failure Maternal Grandmother    Prostate cancer Neg Hx    Kidney cancer Neg Hx     Observations/Objective:   No acute distress.  Alert and oriented.  Speech fluent and not dysarthric.  Language intact.  Eyes orthophoric on primary gaze.  Face symmetric.   Follow up Instructions:      -I discussed the assessment and treatment plan with the patient. The patient was provided an opportunity to ask questions and all were answered. The patient agreed with the plan and demonstrated an understanding of the instructions.   The patient was advised to call back or seek an in-person evaluation if the symptoms worsen or if the condition fails to improve as anticipated.   Juliene Lamar Dunnings, DO  CC: [employer], MD         [1]  Allergies Allergen Reactions   Erythromycin Anaphylaxis and Nausea And Vomiting   Nalbuphine Other (See Comments)    States loss of consciousness    Cefdinir  Diarrhea, Nausea And  Vomiting and Rash   Cephalexin Diarrhea and Nausea And Vomiting   Cephalosporins Diarrhea and Nausea And Vomiting   Hydrocodone -Acetaminophen  Nausea And Vomiting   Ketorolac  Nausea And Vomiting    Other reaction(s): upset stomach   Prochlorperazine Edisylate Other (See Comments)    uncontrollable shake - tremor    Restasis  [Cyclosporine ] Other (See Comments)    migraines   Tyloxapol Swelling   Bacitracin-Polymyxin B     Other reaction(s): rash gets worse Other reaction(s): rash gets worse   Codeine  Other (See Comments)    Other reaction(s): stomach upset   Nalbuphine Hcl     Other reaction(s): stomach upset   Prochlorperazine     Other reaction(s): tremors/vomiting   Propranolol  Diarrhea   Rosuvastatin  Diarrhea   Topamax [Topiramate] Diarrhea and Nausea And Vomiting   Zetia  [Ezetimibe ] Diarrhea   Cefdinir  Diarrhea, Nausea And Vomiting and Rash  Other reaction(s): too stronge   Doxycycline Hyclate Nausea Only and Other (See Comments)    abd pain   Iodinated Contrast Media Other (See Comments)    Lightheaded, flushed feeling (explained to patient these are normal side effects of IV iodinated contrast, not allergies, but he wanted this noted in the epic; he tolerated epidural steroid injections w/ contrast w/o difficulty)   Ketoconazole  Itching   Ketorolac  Tromethamine  Rash    REACTION: Reaction not known   Latex Itching and Rash   Neosporin [Neomycin-Bacitracin Zn-Polymyx] Rash   Oxycodone -Acetaminophen  Other (See Comments)    Tylox caused constipation   Prednisone  Nausea And Vomiting    Oral route, only Other reaction(s): stomach spasms   "

## 2024-05-19 ENCOUNTER — Telehealth: Payer: Self-pay

## 2024-05-19 ENCOUNTER — Telehealth: Payer: Self-pay | Admitting: Pharmacy Technician

## 2024-05-19 ENCOUNTER — Telehealth (INDEPENDENT_AMBULATORY_CARE_PROVIDER_SITE_OTHER): Admitting: Neurology

## 2024-05-19 ENCOUNTER — Other Ambulatory Visit (HOSPITAL_COMMUNITY): Payer: Self-pay

## 2024-05-19 ENCOUNTER — Encounter: Payer: Self-pay | Admitting: Neurology

## 2024-05-19 DIAGNOSIS — M5416 Radiculopathy, lumbar region: Secondary | ICD-10-CM

## 2024-05-19 DIAGNOSIS — G250 Essential tremor: Secondary | ICD-10-CM | POA: Diagnosis not present

## 2024-05-19 DIAGNOSIS — G43009 Migraine without aura, not intractable, without status migrainosus: Secondary | ICD-10-CM | POA: Diagnosis not present

## 2024-05-19 MED ORDER — NURTEC 75 MG PO TBDP: 75.0000 mg | ORAL_TABLET | ORAL | 11 refills | Status: AC | PRN

## 2024-05-19 MED ORDER — EMGALITY 120 MG/ML ~~LOC~~ SOAJ: 120.0000 mg | SUBCUTANEOUS | 3 refills | Status: DC

## 2024-05-19 NOTE — Telephone Encounter (Signed)
 Pharmacy Patient Advocate Encounter   Received notification from Pt Calls Messages that prior authorization for NURTEC 75MG  is required/requested.   Insurance verification completed.   The patient is insured through [facility] ADVANTAGE/RX ADVANCE.   Per test claim: PA required; PA submitted to above mentioned insurance via Latent Key/confirmation #/EOC [id number] Status is pending

## 2024-05-19 NOTE — Telephone Encounter (Signed)
 PA has been submitted, and telephone encounter has been created. Please see telephone encounter dated 1.20.26.

## 2024-05-19 NOTE — Telephone Encounter (Signed)
 Per patient needs a PA for Nurtec.

## 2024-05-20 ENCOUNTER — Encounter: Payer: Self-pay | Admitting: Neurology

## 2024-05-20 ENCOUNTER — Other Ambulatory Visit (HOSPITAL_COMMUNITY): Payer: Self-pay

## 2024-05-20 NOTE — Telephone Encounter (Signed)
 Pharmacy Patient Advocate Encounter  Received notification from [employer] that Prior Authorization for NURTEC 75MG  has been DENIED.  See denial reason below. No denial letter attached in CMM. Will attach denial letter to Media tab once received.    PA #/Case ID/Reference #: [id number]

## 2024-05-20 NOTE — Telephone Encounter (Signed)
 Encounter has been updated. Patient has not tried formulary drug Ubrelvy. PA will be needed for Ubrelvy. If appeal is wanted for Nurtec please keep in mind that if it approved, that the copay would ultimately be higher due to it being a non formulary drug.

## 2024-05-24 ENCOUNTER — Other Ambulatory Visit: Payer: Self-pay | Admitting: Neurology

## 2024-05-29 ENCOUNTER — Ambulatory Visit (INDEPENDENT_AMBULATORY_CARE_PROVIDER_SITE_OTHER): Admitting: Student

## 2024-05-29 ENCOUNTER — Ambulatory Visit (INDEPENDENT_AMBULATORY_CARE_PROVIDER_SITE_OTHER)

## 2024-05-29 DIAGNOSIS — M25561 Pain in right knee: Secondary | ICD-10-CM

## 2024-05-29 NOTE — Progress Notes (Signed)
 "                                Chief Complaint: Right knee pain     History of Present Illness:    William [name] is a 64 y.o. male who presents today for evaluation of right knee pain.  He reports that a few hours ago he sustained a fall due to slipping on ice while getting out of his car.  He directly impacted the ground with his right knee.  He is having pain over the anterior knee but is able to weight-bear without assistive device.  He rates pain currently at a 3/10.  Denies any popping or buckling.  No previous history of knee injury or surgery.  He takes Tylenol  for pain as he has adverse reactions to most other pain medications.  Surgical History:   None  PMH/PSH/Family History/Social History/Meds/Allergies:    Past Medical History:  Diagnosis Date   Allergy    Anxiety    Chronic kidney disease    Depression    Diverticulitis    Gastroparesis    Heart murmur    Migraine headache    Mitral valve prolapse    SVT (supraventricular tachycardia)    Past Surgical History:  Procedure Laterality Date   COLONOSCOPY W/ BIOPSIES  2023   RADIOACTIVE SEED IMPLANT N/A 01/19/2022   Procedure: RADIOACTIVE SEED IMPLANT/BRACHYTHERAPY IMPLANT;  Surgeon: Watt Rush, MD;  Location: [facility];  Service: Urology;  Laterality: N/A;   SPACE OAR INSTILLATION N/A 01/19/2022   Procedure: SPACE OAR INSTILLATION;  Surgeon: Watt Rush, MD;  Location: [facility];  Service: Urology;  Laterality: N/A;   Social History   Socioeconomic History   Marital status: Married    Spouse name: Not on file   Number of children: 0   Years of education: Not on file   Highest education level: Bachelor's degree (e.g., BA, AB, BS)  Occupational History   Not on file  Tobacco Use   Smoking status: Never   Smokeless tobacco: Never  Vaping Use   Vaping status: Never Used  Substance and Sexual Activity   Alcohol use: No    Alcohol/week: 0.0 standard drinks of  alcohol   Drug use: No   Sexual activity: Yes    Partners: Female  Other Topics Concern   Not on file  Social History Narrative   Pt is married lives with spouse in 1 story apartment he has No children   BS degree- he is right handed   Drinks soda  Ginger ale, no coffee no tea   Right handed   Social Drivers of Health   Tobacco Use: Low Risk (05/22/2024)   Received from [facility]   Patient History    Smoking Tobacco Use: Never    Smokeless Tobacco Use: Never    Passive Exposure: Not on file  Financial Resource Strain: Low Risk (10/25/2021)   Overall Financial Resource Strain (CARDIA)    Difficulty of Paying Living Expenses: Not hard at all  Food Insecurity: Low Risk (05/11/2024)   Received from [facility]   Epic    Within the past 12 months, you worried that your food would run out before you got money to buy more: Never true    Within the past 12 months, the food you bought just didn't last and you didn't have money to get more. : Never true  Transportation  Needs: No Transportation Needs (05/11/2024)   Received from [facility]    In the past 12 months, has lack of reliable transportation kept you from medical appointments, meetings, work or from getting things needed for daily living? : No  Physical Activity: Not on file  Stress: Stress Concern Present (10/25/2021)   [employer] of Occupational Health - Occupational Stress Questionnaire    Feeling of Stress : Rather much  Social Connections: Not on file  Depression (PHQ2-9): Not on file  Alcohol Screen: Not on file  Housing: Low Risk (05/11/2024)   Received from [facility]   Epic    What is your living situation today?: I have a steady place to live    Think about the place you live. Do you have problems with any of the following? Choose all that apply:: None/None on this list  Utilities: Low Risk (05/11/2024)   Received from [facility]   Utilities    In the past 12 months has the  electric, gas, oil, or water  company threatened to shut off services in your home? : No  Health Literacy: Not on file   Family History  Problem Relation Age of Onset   Hypertension Mother    Lung cancer Father 58 - 35   Hypertension Brother    Kidney failure Maternal Grandmother    Prostate cancer Neg Hx    Kidney cancer Neg Hx    Allergies[1] Current Outpatient Medications  Medication Sig Dispense Refill   amoxicillin  (AMOXIL ) 500 MG tablet Take 1 tablet (500 mg total) by mouth 2 (two) times daily. 20 tablet 0   diltiazem  (CARDIZEM  CD) 120 MG 24 hr capsule Take 1 capsule (120 mg total) by mouth daily. 90 capsule 3   diltiazem  (CARDIZEM ) 30 MG tablet TAKE (1) TABLET AS NEEDED FOR PALPITATIONS (Patient taking differently: Take 30 mg by mouth as needed. TAKE (1) TABLET AS NEEDED FOR PALPITATIONS) 30 tablet 6   Evolocumab  (REPATHA  SURECLICK) 140 MG/ML SOAJ Inject 140 mg into the skin every 14 (fourteen) days. 6 mL 3   fluticasone  (FLONASE ) 50 MCG/ACT nasal spray Place 2 sprays into both nostrils daily. 16 g 6   Galcanezumab -gnlm (EMGALITY ) 120 MG/ML SOAJ INJECT 120MG  (1 PEN) UNDER THE SKIN EVERY 28 DAYS 3 mL 2   hydrocortisone (ANUSOL-HC) 2.5 % rectal cream SMARTSIG:Rectally 3 Times Daily PRN     hydrOXYzine (ATARAX) 50 MG tablet Take 25-50 mg by mouth 2 (two) times daily as needed for itching.     metoCLOPramide  (REGLAN ) 5 MG tablet Take 5 mg by mouth as needed.     polyethylene glycol powder (GLYCOLAX /MIRALAX ) 17 GM/SCOOP powder Take 17 g by mouth as needed for moderate constipation.     Rimegepant Sulfate (NURTEC) 75 MG TBDP Take 1 tablet (75 mg total) by mouth as needed for up to 1 dose (Maximum 1 tablet in 24 hours.). 8 tablet 11   silodosin (RAPAFLO) 8 MG CAPS capsule Take 8 mg by mouth daily with breakfast.     No current facility-administered medications for this visit.   No results found.  Review of Systems:   A ROS was performed including pertinent positives and negatives as  documented in the HPI.  Physical Exam :   Constitutional: NAD and appears stated age Neurological: Alert and oriented Psych: Appropriate affect and cooperative There were no vitals taken for this visit.   Comprehensive Musculoskeletal Exam:    Exam of the right knee demonstrates no obvious deformity.  Full active range of motion from -3 to 135 degrees without crepitus.  Small area of faint ecchymosis noted over the inferior pole of the patella.  No patellar tenderness.  There is some pinpoint tenderness over the pes anserine without localized swelling.  Imaging:   Xray (right knee 4 views): Negative for fracture or dislocation.  Joint spaces are well-maintained   I personally reviewed and interpreted the radiographs.   Assessment:   64 y.o. male presenting today with acute right knee pain after a fall directly on his knee earlier today.  X-rays taken today are well-appearing without evidence of fracture.  He has a small amount of ecchymosis noted on the inferior patella so I do believe this is consistent with a contusion.  Full range of motion and no signs of instability on exam.  Discussed expected course of healing with conservative management.  He can utilize RICE therapy for swelling and inflammation as well as Tylenol  as needed for pain.  If symptoms have not significantly proved to resolve within 4 weeks I would like to see him back for further evaluation.  Plan :    - Follow-up as needed     I personally saw and evaluated the patient, and participated in the management and treatment plan.  Leonce Reveal, PA-C Orthopedics    [1]  Allergies Allergen Reactions   Erythromycin Anaphylaxis and Nausea And Vomiting   Nalbuphine Other (See Comments)    States loss of consciousness    Cefdinir  Diarrhea, Nausea And Vomiting and Rash   Cephalexin Diarrhea and Nausea And Vomiting   Cephalosporins Diarrhea and Nausea And Vomiting   Hydrocodone -Acetaminophen  Nausea And  Vomiting   Ketorolac  Nausea And Vomiting    Other reaction(s): upset stomach   Prochlorperazine Edisylate Other (See Comments)    uncontrollable shake - tremor    Restasis  [Cyclosporine ] Other (See Comments)    migraines   Tyloxapol Swelling   Bacitracin-Polymyxin B     Other reaction(s): rash gets worse Other reaction(s): rash gets worse   Codeine  Other (See Comments)    Other reaction(s): stomach upset   Nalbuphine Hcl     Other reaction(s): stomach upset   Prochlorperazine     Other reaction(s): tremors/vomiting   Propranolol  Diarrhea   Rosuvastatin  Diarrhea   Topamax [Topiramate] Diarrhea and Nausea And Vomiting   Zetia  [Ezetimibe ] Diarrhea   Cefdinir  Diarrhea, Nausea And Vomiting and Rash    Other reaction(s): too stronge   Doxycycline Hyclate Nausea Only and Other (See Comments)    abd pain   Iodinated Contrast Media Other (See Comments)    Lightheaded, flushed feeling (explained to patient these are normal side effects of IV iodinated contrast, not allergies, but he wanted this noted in the epic; he tolerated epidural steroid injections w/ contrast w/o difficulty)   Ketoconazole  Itching   Ketorolac  Tromethamine  Rash    REACTION: Reaction not known   Latex Itching and Rash   Neosporin [Neomycin-Bacitracin Zn-Polymyx] Rash   Oxycodone -Acetaminophen  Other (See Comments)    Tylox caused constipation   Prednisone  Nausea And Vomiting    Oral route, only Other reaction(s): stomach spasms   "

## 2024-06-03 ENCOUNTER — Telehealth: Payer: Self-pay | Admitting: Neurology

## 2024-06-03 NOTE — Telephone Encounter (Signed)
 Greg lvm regarding Nurtec samples. He stated that he tried to get Rx filled, but insurance denied it. He stated that he sent a Mychart message as well.  PH: [phone]

## 2024-06-03 NOTE — Telephone Encounter (Signed)
 See my chart message

## 2024-06-03 NOTE — Progress Notes (Unsigned)
 " [name] [address] [place] [address] Phone: [phone] Subjective:   William [name], am serving as a scribe for Dr. Arthea Claudene.  I'm seeing this patient by the request  of:  Kip Righter, MD  CC: Back pain follow-up  YEP:Dlagzrupcz  02/11/2024 B12 injection given today and repeat monthly     Downtrending labs patient is at a good area with no significant pain.  No change in medication, follow-up with me again 3 to 4 months     Have done extremely well after physical therapy.   Update 06/04/2024 William [name] is a 64 y.o. male coming in with complaint of pelvic floor dysfunction. Patient states that he feel on R knee last Friday in the snow. Landed on patella. Soreness in knee.   L shoulder pain after grabbing heavy object from top of closet. Burning sensation in both arms and down the L leg.   Has been dealing with sinus infection for past 2 weeks. Can he get an antibiotic.   Since we have seen patient was seen by an outside provider for knee pain.  Patient did slip and unfortunately fell on the anterior aspect of his knee.  Patient did have x-rays ordered at that time.  No sign of any fracture noted.  Patient was to do conservative therapy.    Past Medical History:  Diagnosis Date   Allergy    Anxiety    Chronic kidney disease    Depression    Diverticulitis    Gastroparesis    Heart murmur    Migraine headache    Mitral valve prolapse    SVT (supraventricular tachycardia)    Past Surgical History:  Procedure Laterality Date   COLONOSCOPY W/ BIOPSIES  2023   RADIOACTIVE SEED IMPLANT N/A 01/19/2022   Procedure: RADIOACTIVE SEED IMPLANT/BRACHYTHERAPY IMPLANT;  Surgeon: Watt Rush, MD;  Location: [facility];  Service: Urology;  Laterality: N/A;   SPACE OAR INSTILLATION N/A 01/19/2022   Procedure: SPACE OAR INSTILLATION;  Surgeon: Watt Rush, MD;  [facility];  Service:  Urology;  Laterality: N/A;   Social History   Socioeconomic History   Marital status: Married    Spouse name: Not on file   Number of children: 0   Years of education: Not on file   Highest education level: Bachelor's degree (e.g., BA, AB, BS)  Occupational History   Not on file  Tobacco Use   Smoking status: Never   Smokeless tobacco: Never  Vaping Use   Vaping status: Never Used  Substance and Sexual Activity   Alcohol use: No    Alcohol/week: 0.0 standard drinks of alcohol   Drug use: No   Sexual activity: Yes    Partners: Female  Other Topics Concern   Not on file  Social History Narrative   Pt is married lives with spouse in 1 story apartment he has No children   BS degree- he is right handed   Drinks soda  Ginger ale, no coffee no tea   Right handed   Social Drivers of Health   Tobacco Use: Low Risk (05/22/2024)   Received from [facility]   Patient History    Smoking Tobacco Use: Never    Smokeless Tobacco Use: Never    Passive Exposure: Not on file  Financial Resource Strain: Low Risk (10/25/2021)   Overall Financial Resource Strain (CARDIA)    Difficulty of Paying Living Expenses: Not hard at  all  Food Insecurity: Low Risk (06/03/2024)   Received from [facility]   Epic    Within the past 12 months, you worried that your food would run out before you got money to buy more: Never true    Within the past 12 months, the food you bought just didn't last and you didn't have money to get more. : Never true  Transportation Needs: No Transportation Needs (06/03/2024)   Received from [facility]    In the past 12 months, has lack of reliable transportation kept you from medical appointments, meetings, work or from getting things needed for daily living? : No  Physical Activity: Not on file  Stress: Stress Concern Present (10/25/2021)   [employer] of Occupational [employer] - Occupational Stress Questionnaire    Feeling of Stress : Rather much   Social Connections: Not on file  Depression (PHQ2-9): Not on file  Alcohol Screen: Not on file  Housing: Low Risk (06/03/2024)   Received from [facility]   Epic    What is your living situation today?: I have a steady place to live    Think about the place you live. Do you have problems with any of the following? Choose all that apply:: None/None on this list  Utilities: Low Risk (06/03/2024)   Received from [facility]   Utilities    In the past 12 months has the electric, gas, oil, or water  company threatened to shut off services in your home? : No  Health Literacy: Not on file   Allergies[1] Family History  Problem Relation Age of Onset   Hypertension Mother    Lung cancer Father 32 - 50   Hypertension Brother    Kidney failure Maternal Grandmother    Prostate cancer Neg Hx    Kidney cancer Neg Hx     Current Outpatient Medications (Cardiovascular):    diltiazem  (CARDIZEM  CD) 120 MG 24 hr capsule, Take 1 capsule (120 mg total) by mouth daily.   diltiazem  (CARDIZEM ) 30 MG tablet, TAKE (1) TABLET AS NEEDED FOR PALPITATIONS (Patient taking differently: Take 30 mg by mouth as needed. TAKE (1) TABLET AS NEEDED FOR PALPITATIONS)   Evolocumab  (REPATHA  SURECLICK) 140 MG/ML SOAJ, Inject 140 mg into the skin every 14 (fourteen) days.  Current Outpatient Medications (Respiratory):    fluticasone  (FLONASE ) 50 MCG/ACT nasal spray, Place 2 sprays into both nostrils daily.  Current Outpatient Medications (Analgesics):    Galcanezumab -gnlm (EMGALITY ) 120 MG/ML SOAJ, INJECT 120MG  (1 PEN) UNDER THE SKIN EVERY 28 DAYS   Rimegepant Sulfate (NURTEC) 75 MG TBDP, Take 1 tablet (75 mg total) by mouth as needed for up to 1 dose (Maximum 1 tablet in 24 hours.).  Current Outpatient Medications (Other):    amoxicillin  (AMOXIL ) 500 MG tablet, Take 1 tablet (500 mg total) by mouth 2 (two) times daily.   amoxicillin -clavulanate (AUGMENTIN ) 875-125 MG tablet, Take 1 tablet by mouth 2 (two) times  daily.   hydrocortisone (ANUSOL-HC) 2.5 % rectal cream, SMARTSIG:Rectally 3 Times Daily PRN   hydrOXYzine (ATARAX) 50 MG tablet, Take 25-50 mg by mouth 2 (two) times daily as needed for itching.   metoCLOPramide  (REGLAN ) 5 MG tablet, Take 5 mg by mouth as needed.   polyethylene glycol powder (GLYCOLAX /MIRALAX ) 17 GM/SCOOP powder, Take 17 g by mouth as needed for moderate constipation.   silodosin (RAPAFLO) 8 MG CAPS capsule, Take 8 mg by mouth daily with breakfast.   Reviewed prior external information including notes and imaging from  primary care provider As well as notes that were available from care everywhere and other healthcare systems.  Past medical history, social, surgical and family history all reviewed in electronic medical record.  No pertanent information unless stated regarding to the chief complaint.   Review of Systems:  No headache, visual changes, nausea, vomiting, diarrhea, constipation, dizziness, abdominal pain, skin rash, fevers, chills, night sweats, weight loss, swollen lymph nodes, body aches, joint swelling, chest pain, shortness of breath, mood changes. POSITIVE muscle aches, hematuria, fatigue  Objective  Blood pressure 132/86, pulse 78, height 5' 10 (1.778 m), weight 149 lb (67.6 kg), SpO2 99%.   General: No apparent distress alert and oriented x3 mood and affect normal, dressed appropriately.  HEENT: Pupils equal, extraocular movements intact  Respiratory: Patient's speak in full sentences and does not appear short of breath  Cardiovascular: No lower extremity edema, non tender, no erythema  Does have a contusion with mild bruising noted on the medial aspect of the knee.  Full range of motion noted today. Leg exam does have some mild loss of lordosis.  Some tenderness to palpation in the paraspinal musculature.  Good grip strength noted today though.  Negative Tinel's at the wrist.   Impression and Recommendations:     The above documentation has been  reviewed and is accurate and complete Arthea CHRISTELLA Sharps, DO       [1]  Allergies Allergen Reactions   Erythromycin Anaphylaxis and Nausea And Vomiting   Nalbuphine Other (See Comments)    States loss of consciousness    Cefdinir  Diarrhea, Nausea And Vomiting and Rash   Cephalexin Diarrhea and Nausea And Vomiting   Cephalosporins Diarrhea and Nausea And Vomiting   Hydrocodone -Acetaminophen  Nausea And Vomiting   Ketorolac  Nausea And Vomiting    Other reaction(s): upset stomach   Prochlorperazine Edisylate Other (See Comments)    uncontrollable shake - tremor    Restasis  [Cyclosporine ] Other (See Comments)    migraines   Tyloxapol Swelling   Bacitracin-Polymyxin B     Other reaction(s): rash gets worse Other reaction(s): rash gets worse   Codeine  Other (See Comments)    Other reaction(s): stomach upset   Nalbuphine Hcl     Other reaction(s): stomach upset   Prochlorperazine     Other reaction(s): tremors/vomiting   Propranolol  Diarrhea   Rosuvastatin  Diarrhea   Topamax [Topiramate] Diarrhea and Nausea And Vomiting   Zetia  [Ezetimibe ] Diarrhea   Cefdinir  Diarrhea, Nausea And Vomiting and Rash    Other reaction(s): too stronge   Doxycycline Hyclate Nausea Only and Other (See Comments)    abd pain   Iodinated Contrast Media Other (See Comments)    Lightheaded, flushed feeling (explained to patient these are normal side effects of IV iodinated contrast, not allergies, but he wanted this noted in the epic; he tolerated epidural steroid injections w/ contrast w/o difficulty)   Ketoconazole  Itching   Ketorolac  Tromethamine  Rash    REACTION: Reaction not known   Latex Itching and Rash   Neosporin [Neomycin-Bacitracin Zn-Polymyx] Rash   Oxycodone -Acetaminophen  Other (See Comments)    Tylox caused constipation   Prednisone  Nausea And Vomiting    Oral route, only Other reaction(s): stomach spasms   "

## 2024-06-04 ENCOUNTER — Encounter: Payer: Self-pay | Admitting: Family Medicine

## 2024-06-04 ENCOUNTER — Ambulatory Visit: Admitting: Family Medicine

## 2024-06-04 ENCOUNTER — Other Ambulatory Visit: Payer: Self-pay | Admitting: Urology

## 2024-06-04 ENCOUNTER — Ambulatory Visit: Payer: Self-pay | Admitting: Family Medicine

## 2024-06-04 VITALS — BP 132/86 | HR 78 | Ht 70.0 in | Wt 149.0 lb

## 2024-06-04 DIAGNOSIS — R319 Hematuria, unspecified: Secondary | ICD-10-CM | POA: Diagnosis not present

## 2024-06-04 DIAGNOSIS — E538 Deficiency of other specified B group vitamins: Secondary | ICD-10-CM | POA: Diagnosis not present

## 2024-06-04 DIAGNOSIS — J320 Chronic maxillary sinusitis: Secondary | ICD-10-CM | POA: Diagnosis not present

## 2024-06-04 DIAGNOSIS — R5383 Other fatigue: Secondary | ICD-10-CM

## 2024-06-04 DIAGNOSIS — S8001XA Contusion of right knee, initial encounter: Secondary | ICD-10-CM | POA: Insufficient documentation

## 2024-06-04 DIAGNOSIS — J329 Chronic sinusitis, unspecified: Secondary | ICD-10-CM | POA: Insufficient documentation

## 2024-06-04 LAB — URINALYSIS
Bilirubin Urine: NEGATIVE
Ketones, ur: NEGATIVE
Leukocytes,Ua: NEGATIVE
Nitrite: NEGATIVE
Specific Gravity, Urine: 1.015 (ref 1.000–1.030)
Total Protein, Urine: NEGATIVE
Urine Glucose: NEGATIVE
Urobilinogen, UA: 1 (ref 0.0–1.0)
pH: 6.5 (ref 5.0–8.0)

## 2024-06-04 MED ORDER — AMOXICILLIN-POT CLAVULANATE 875-125 MG PO TABS: 1.0000 | ORAL_TABLET | Freq: Two times a day (BID) | ORAL | 0 refills | Status: AC

## 2024-06-04 MED ORDER — CYANOCOBALAMIN 1000 MCG/ML IJ SOLN
1000.0000 ug | Freq: Once | INTRAMUSCULAR | Status: AC
  Administered 2024-06-04: 1000 ug via INTRAMUSCULAR

## 2024-06-04 NOTE — Assessment & Plan Note (Signed)
 With past medical history of prostate cancer and encourage patient to follow-up with urology with the cystoscopy.

## 2024-06-04 NOTE — Assessment & Plan Note (Signed)
 Start once monthly injections again.  Given today

## 2024-06-04 NOTE — Patient Instructions (Addendum)
 Labs today B12 injection today. Arnica lotion. Lipid panel order printed for patient.  Exercise program looks great  Start at 10 squats and 5 push ups a day See me again in 2-3 months

## 2024-06-04 NOTE — Assessment & Plan Note (Signed)
 Sinus infection, given antibiotics.  Warned potential side effects.  Patient has had numerous allergies but feels that amoxicillin  would be the most beneficial.  Will try this and if any difficulty would transition to doxycycline.

## 2024-06-04 NOTE — Assessment & Plan Note (Signed)
 Right knee contusion.  Discussed icing regimen and home exercises, discussed with patient about which activities to do and which ones to avoid.  Increase activity slowly.  Follow-up again in 6 to 8 weeks.

## 2024-06-04 NOTE — Telephone Encounter (Signed)
 Patient advised of Dr.Jaffe, I want to provide him samples of Ubrelvy to try first, as it is preferred by his insurance. Please let him know that it is also a very good medication that I frequently prescribe.   He will try the Ubrelvy samples.

## 2024-06-05 ENCOUNTER — Other Ambulatory Visit: Payer: Self-pay

## 2024-06-05 ENCOUNTER — Ambulatory Visit: Admitting: Neurology

## 2024-06-05 ENCOUNTER — Emergency Department (HOSPITAL_BASED_OUTPATIENT_CLINIC_OR_DEPARTMENT_OTHER)

## 2024-06-05 ENCOUNTER — Ambulatory Visit: Payer: Self-pay | Admitting: Neurology

## 2024-06-05 ENCOUNTER — Emergency Department (HOSPITAL_BASED_OUTPATIENT_CLINIC_OR_DEPARTMENT_OTHER)
Admission: EM | Admit: 2024-06-05 | Discharge: 2024-06-05 | Disposition: A | Discharge status: Home / Self Care | Source: Ambulatory Visit | Attending: Emergency Medicine | Admitting: Emergency Medicine

## 2024-06-05 ENCOUNTER — Other Ambulatory Visit (HOSPITAL_BASED_OUTPATIENT_CLINIC_OR_DEPARTMENT_OTHER): Payer: Self-pay

## 2024-06-05 DIAGNOSIS — S0990XA Unspecified injury of head, initial encounter: Secondary | ICD-10-CM

## 2024-06-05 DIAGNOSIS — R519 Headache, unspecified: Secondary | ICD-10-CM

## 2024-06-05 LAB — LIPID PANEL
Cholesterol: 157 mg/dL
HDL: 63 mg/dL
LDL Cholesterol (Calc): 76 mg/dL
Non-HDL Cholesterol (Calc): 94 mg/dL
Total CHOL/HDL Ratio: 2.5 (calc)
Triglycerides: 93 mg/dL

## 2024-06-05 MED ORDER — METOCLOPRAMIDE HCL 10 MG PO TABS
10.0000 mg | ORAL_TABLET | Freq: Four times a day (QID) | ORAL | 0 refills | Status: AC
  Filled 2024-06-05: qty 30, 8d supply, fill #0

## 2024-06-05 NOTE — Discharge Instructions (Signed)
 Please use Tylenol  for pain.  You may use 1000 mg of Tylenol  every 6 hours.  Not to exceed 4 g of Tylenol  within 24 hours.  You can use the Reglan  up to every 6 hours for headache in addition to the tylenol .  We discussed to recommend around 24 to 48 hours of physical rest, brain rest, avoiding screen time to help with any lingering headache related to the head injury.

## 2024-06-05 NOTE — ED Triage Notes (Signed)
 Reports hitting head over door this morning. Denies LOC. Denies thinners. Ambulatory.   Reports lightheaded and nausea. Hx of migraines.

## 2024-06-05 NOTE — ED Provider Notes (Signed)
 " [place] [facility] AT [facility] Provider Note   CSN: [id number] Arrival date & time: 06/05/24  1021     Patient presents with: Head Injury   William [name] is a 64 y.o. male with past medical history significant for gastroparesis, chronic migraine, GERD, prostate cancer being treated with radioactive beads inserted 2023 who presents concern for hitting head on door this morning.  Denies loss of consciousness, vision changes, double vision, numbness, tingling.  Denies any blood thinners.  Endorses some lightheadedness and nausea.  History of migraines.  Tylenol  prior to arrival.    Head Injury      Prior to Admission medications  Medication Sig Start Date End Date Taking? Authorizing Provider  metoCLOPramide  (REGLAN ) 10 MG tablet Take 1 tablet (10 mg total) by mouth every 6 (six) hours. 06/05/24  Yes Chamari Cutbirth H, PA-C  amoxicillin  (AMOXIL ) 500 MG tablet Take 1 tablet (500 mg total) by mouth 2 (two) times daily. 07/08/23   Smith, Zachary M, DO  amoxicillin -clavulanate (AUGMENTIN ) 875-125 MG tablet Take 1 tablet by mouth 2 (two) times daily. 06/04/24   Claudene Arthea HERO, DO  diltiazem  (CARDIZEM  CD) 120 MG 24 hr capsule Take 1 capsule (120 mg total) by mouth daily. 04/16/24   Verlin Lonni BIRCH, MD  diltiazem  (CARDIZEM ) 30 MG tablet TAKE (1) TABLET AS NEEDED FOR PALPITATIONS Patient taking differently: Take 30 mg by mouth as needed. TAKE (1) TABLET AS NEEDED FOR PALPITATIONS 04/16/24   Verlin Lonni BIRCH, MD  Evolocumab  (REPATHA  SURECLICK) 140 MG/ML SOAJ Inject 140 mg into the skin every 14 (fourteen) days. 10/29/23   Verlin Lonni BIRCH, MD  fluticasone  (FLONASE ) 50 MCG/ACT nasal spray Place 2 sprays into both nostrils daily. 06/24/18   Loreli Elyn SAILOR, MD  Galcanezumab -gnlm (EMGALITY ) 120 MG/ML SOAJ INJECT 120MG  (1 PEN) UNDER THE SKIN EVERY 28 DAYS 05/26/24   Skeet Juliene SAUNDERS, DO  hydrocortisone (ANUSOL-HC) 2.5 % rectal cream SMARTSIG:Rectally 3 Times  Daily PRN 02/15/23   [provider]  hydrOXYzine (ATARAX) 50 MG tablet Take 25-50 mg by mouth 2 (two) times daily as needed for itching. 11/20/21   [provider]  polyethylene glycol powder (GLYCOLAX /MIRALAX ) 17 GM/SCOOP powder Take 17 g by mouth as needed for moderate constipation. 01/29/22   [provider]  Rimegepant Sulfate (NURTEC) 75 MG TBDP Take 1 tablet (75 mg total) by mouth as needed for up to 1 dose (Maximum 1 tablet in 24 hours.). 05/19/24   Skeet Juliene SAUNDERS, DO  silodosin (RAPAFLO) 8 MG CAPS capsule Take 8 mg by mouth daily with breakfast.    [provider]    Allergies: Erythromycin, Nalbuphine, Cefdinir , Cephalexin, Cephalosporins, Hydrocodone -acetaminophen , Ketorolac , Prochlorperazine edisylate, Restasis  [cyclosporine ], Tyloxapol, Bacitracin-polymyxin b, Codeine , Nalbuphine hcl, Prochlorperazine, Propranolol , Rosuvastatin , Topamax [topiramate], Zetia  [ezetimibe ], Cefdinir , Doxycycline hyclate, Iodinated contrast media, Ketoconazole , Ketorolac  tromethamine , Latex, Neosporin [neomycin-bacitracin zn-polymyx], Oxycodone -acetaminophen , and Prednisone     Review of Systems  All other systems reviewed and are negative.   Updated Vital Signs BP 129/79 (BP Location: Right Arm)   Pulse 89   Temp 97.8 F (36.6 C) (Oral)   Resp 17   SpO2 100%   Physical Exam Vitals and nursing note reviewed.  Constitutional:      General: He is not in acute distress.    Appearance: Normal appearance.  HENT:     Head: Normocephalic and atraumatic.  Eyes:     General:        Right eye: No discharge.  Left eye: No discharge.  Cardiovascular:     Rate and Rhythm: Normal rate and regular rhythm.     Heart sounds: No murmur heard.    No friction rub. No gallop.  Pulmonary:     Effort: Pulmonary effort is normal.     Breath sounds: Normal breath sounds.  Abdominal:     General: Bowel sounds are normal.     Palpations: Abdomen is soft.  Skin:    General:  Skin is warm and dry.     Capillary Refill: Capillary refill takes less than 2 seconds.  Neurological:     Mental Status: He is alert and oriented to person, place, and time.     Comments: Cranial nerves II through XII grossly intact.  Intact finger-nose, intact heel-to-shin.  Romberg negative, gait normal.  Alert and oriented x3.  Moves all 4 limbs spontaneously, normal coordination.  No pronator drift.  Intact strength 5 out of 5 bilateral upper and lower extremities.    Psychiatric:        Mood and Affect: Mood normal.        Behavior: Behavior normal.     (all labs ordered are listed, but only abnormal results are displayed) Labs Reviewed - No data to display  EKG: None  Radiology: CT Head Wo Contrast Result Date: 06/05/2024 EXAM: CT HEAD WITHOUT CONTRAST 06/05/2024 10:54:04 AM TECHNIQUE: CT of the head was performed without the administration of intravenous contrast. Automated exposure control, iterative reconstruction, and/or weight based adjustment of the mA/kV was utilized to reduce the radiation dose to as low as reasonably achievable. COMPARISON: Brain MRI 11/19/2015, CT head 02/04/2014. CLINICAL HISTORY: 64 year old male. Struck head on door. Moderate-severe head trauma. FINDINGS: BRAIN AND VENTRICLES: Normal brain volume for age. No acute hemorrhage. No evidence of acute infarct. No hydrocephalus. No extra-axial collection. No mass effect or midline shift. Normal gray-white differentiation for age. No suspicious intracranial vascular hyperdensity. Calcified atherosclerosis at the skull base. SINUSES: Paranasal sinuses, tympanic cavities, and mastoids are clear. SOFT TISSUES AND SKULL: Right vertex scalp soft tissue scarring is stable. No acute orbit or scalp soft tissue injury identified. No skull fracture. IMPRESSION: 1. No acute traumatic injury identified. 2. Normal for age non-contrast head CT. Electronically signed by: Helayne Hurst MD 06/05/2024 10:58 AM EST RP Workstation:  [id number]     Procedures   Medications Ordered in the ED - No data to display                                  Medical Decision Making Amount and/or Complexity of Data Reviewed Radiology: ordered.   This patient is a 64 y.o. male who presents to the ED for concern of head injury, headache.   Differential diagnoses prior to evaluation: epidural hematoma, subdural hematoma, skull fracture, subarachnoid hemorrhage, unstable cervical spine fracture, concussion vs other MSK injury   Past Medical History / Social History / Additional history: Chart reviewed. Pertinent results include: migraines  Physical Exam: Physical exam performed. The pertinent findings include:  Cranial nerves II through XII grossly intact.  Intact finger-nose, intact heel-to-shin.  Romberg negative, gait normal.  Alert and oriented x3.  Moves all 4 limbs spontaneously, normal coordination.  No pronator drift.  Intact strength 5 out of 5 bilateral upper and lower extremities.  Head is overall atraumatic appearing, there is some very mild bruising to right frontal forehead.  Vital signs stable in the emergency department.  Imaging: I independently interpreted CT head without contrast which shows no evidence of acute intracranial injury, skull fracture.  Medications / Treatment: Patient reports he has mild headache, nausea is overall resolved, discussed head injury, versus very mild concussion, encouraged Tylenol , Reglan , physical rest, brain rest.   Disposition: After consideration of the diagnostic results and the patients response to treatment, I feel that stable for discharge with plan as above.   emergency department workup does not suggest an emergent condition requiring admission or immediate intervention beyond what has been performed at this time. The plan is: as above. The patient is safe for discharge and has been instructed to return immediately for worsening symptoms, change in symptoms or any other  concerns.   Final diagnoses:  Injury of head, initial encounter  Acute nonintractable headache, unspecified headache type    ED Discharge Orders          Ordered    metoCLOPramide  (REGLAN ) 10 MG tablet  Every 6 hours        06/05/24 1128               Arlinda Barcelona, Aurora H, PA-C 06/05/24 1129    Lenor Hollering, MD 06/05/24 1422  "

## 2024-06-05 NOTE — Telephone Encounter (Signed)
 Medication Samples have been provided to the patient.  Drug name: Holland       Strength: 100 mg        Qty: 4  LOT: [id number]  Exp.Date: 4/28  Dosing instructions: as needed  The patient has been instructed regarding the correct time, dose, and frequency of taking this medication, including desired effects and most common side effects.   William [name] 8:07 AM 06/05/2024

## 2024-06-23 ENCOUNTER — Ambulatory Visit: Admitting: Family Medicine

## 2024-06-29 ENCOUNTER — Ambulatory Visit: Admitting: Family Medicine

## 2024-07-20 ENCOUNTER — Ambulatory Visit: Admitting: Family Medicine

## 2024-07-28 ENCOUNTER — Ambulatory Visit (HOSPITAL_COMMUNITY): Admit: 2024-07-28 | Admitting: Urology

## 2024-08-04 ENCOUNTER — Ambulatory Visit: Admitting: Family Medicine

## 2025-05-19 ENCOUNTER — Ambulatory Visit: Payer: Self-pay | Admitting: Neurology
